# Patient Record
Sex: Female | Born: 1955
Health system: Southern US, Community
[De-identification: ages and names within clinical notes are randomized; demographics above are authoritative.]

## PROBLEM LIST (undated history)

## (undated) DIAGNOSIS — E785 Hyperlipidemia, unspecified: Secondary | ICD-10-CM

## (undated) DIAGNOSIS — E7800 Pure hypercholesterolemia, unspecified: Secondary | ICD-10-CM

## (undated) DIAGNOSIS — I099 Rheumatic heart disease, unspecified: Secondary | ICD-10-CM

## (undated) DIAGNOSIS — Z952 Presence of prosthetic heart valve: Secondary | ICD-10-CM

## (undated) DIAGNOSIS — I272 Pulmonary hypertension, unspecified: Secondary | ICD-10-CM

## (undated) DIAGNOSIS — M1990 Unspecified osteoarthritis, unspecified site: Secondary | ICD-10-CM

## (undated) DIAGNOSIS — Z7901 Long term (current) use of anticoagulants: Secondary | ICD-10-CM

## (undated) DIAGNOSIS — R011 Cardiac murmur, unspecified: Secondary | ICD-10-CM

## (undated) DIAGNOSIS — E0590 Thyrotoxicosis, unspecified without thyrotoxic crisis or storm: Secondary | ICD-10-CM

## (undated) DIAGNOSIS — Z9889 Other specified postprocedural states: Secondary | ICD-10-CM

## (undated) DIAGNOSIS — I482 Chronic atrial fibrillation, unspecified: Secondary | ICD-10-CM

## (undated) DIAGNOSIS — E049 Nontoxic goiter, unspecified: Secondary | ICD-10-CM

## (undated) DIAGNOSIS — Z8639 Personal history of other endocrine, nutritional and metabolic disease: Secondary | ICD-10-CM

## (undated) DIAGNOSIS — E042 Nontoxic multinodular goiter: Secondary | ICD-10-CM

## (undated) DIAGNOSIS — I499 Cardiac arrhythmia, unspecified: Secondary | ICD-10-CM

## (undated) DIAGNOSIS — I4821 Permanent atrial fibrillation: Secondary | ICD-10-CM

## (undated) HISTORY — PX: TRANSTHORACIC ECHOCARDIOGRAM: SHX275

## (undated) HISTORY — DX: Nontoxic goiter, unspecified: E04.9

## (undated) HISTORY — PX: ERCP: SHX60

## (undated) HISTORY — DX: Nontoxic multinodular goiter: E04.2

## (undated) HISTORY — DX: Permanent atrial fibrillation: I48.21

## (undated) HISTORY — DX: Other specified postprocedural states: Z98.890

## (undated) HISTORY — DX: Pure hypercholesterolemia, unspecified: E78.00

## (undated) HISTORY — PX: CARDIAC CATHETERIZATION: SHX172

## (undated) HISTORY — DX: Rheumatic heart disease, unspecified: I09.9

## (undated) SURGERY — OPEN REDUCTION INTERNAL FIXATION (ORIF) ANKLE FRACTURE
Anesthesia: General | Laterality: Right

---

## 1995-06-24 HISTORY — PX: MITRAL VALVE REPLACEMENT: SHX147

## 1998-09-13 ENCOUNTER — Ambulatory Visit (HOSPITAL_COMMUNITY): Admission: RE | Admit: 1998-09-13 | Discharge: 1998-09-13 | Payer: Self-pay | Admitting: Internal Medicine

## 1998-09-14 ENCOUNTER — Encounter: Payer: Self-pay | Admitting: Internal Medicine

## 2001-12-14 ENCOUNTER — Encounter: Payer: Self-pay | Admitting: Orthopaedic Surgery

## 2001-12-21 ENCOUNTER — Inpatient Hospital Stay (HOSPITAL_COMMUNITY): Admission: RE | Admit: 2001-12-21 | Discharge: 2001-12-22 | Payer: Self-pay | Admitting: Orthopaedic Surgery

## 2001-12-21 ENCOUNTER — Encounter: Payer: Self-pay | Admitting: Orthopaedic Surgery

## 2001-12-21 HISTORY — PX: ANTERIOR CERVICAL DECOMP/DISCECTOMY FUSION: SHX1161

## 2002-03-31 ENCOUNTER — Emergency Department (HOSPITAL_COMMUNITY): Admission: EM | Admit: 2002-03-31 | Discharge: 2002-04-01 | Payer: Self-pay | Admitting: Emergency Medicine

## 2002-04-01 ENCOUNTER — Encounter: Payer: Self-pay | Admitting: Emergency Medicine

## 2002-04-05 ENCOUNTER — Inpatient Hospital Stay (HOSPITAL_COMMUNITY): Admission: EM | Admit: 2002-04-05 | Discharge: 2002-04-25 | Payer: Self-pay | Admitting: Emergency Medicine

## 2002-04-07 ENCOUNTER — Encounter: Payer: Self-pay | Admitting: Surgery

## 2002-04-08 ENCOUNTER — Encounter (INDEPENDENT_AMBULATORY_CARE_PROVIDER_SITE_OTHER): Payer: Self-pay | Admitting: Specialist

## 2002-04-08 ENCOUNTER — Encounter: Payer: Self-pay | Admitting: *Deleted

## 2002-04-08 ENCOUNTER — Encounter: Payer: Self-pay | Admitting: Surgery

## 2002-04-08 HISTORY — PX: LAPAROSCOPIC CHOLECYSTECTOMY: SUR755

## 2002-04-13 ENCOUNTER — Encounter: Payer: Self-pay | Admitting: Cardiology

## 2002-04-18 ENCOUNTER — Encounter: Payer: Self-pay | Admitting: *Deleted

## 2002-05-20 ENCOUNTER — Emergency Department (HOSPITAL_COMMUNITY): Admission: EM | Admit: 2002-05-20 | Discharge: 2002-05-20 | Payer: Self-pay | Admitting: Emergency Medicine

## 2002-05-30 ENCOUNTER — Ambulatory Visit (HOSPITAL_COMMUNITY): Admission: RE | Admit: 2002-05-30 | Discharge: 2002-05-31 | Payer: Self-pay | Admitting: *Deleted

## 2002-05-30 ENCOUNTER — Encounter: Payer: Self-pay | Admitting: *Deleted

## 2003-03-03 ENCOUNTER — Emergency Department (HOSPITAL_COMMUNITY): Admission: EM | Admit: 2003-03-03 | Discharge: 2003-03-03 | Payer: Self-pay | Admitting: Emergency Medicine

## 2003-05-12 ENCOUNTER — Emergency Department (HOSPITAL_COMMUNITY): Admission: AD | Admit: 2003-05-12 | Discharge: 2003-05-12 | Payer: Self-pay | Admitting: Family Medicine

## 2003-05-15 ENCOUNTER — Emergency Department (HOSPITAL_COMMUNITY): Admission: AD | Admit: 2003-05-15 | Discharge: 2003-05-15 | Payer: Self-pay | Admitting: Family Medicine

## 2003-09-24 ENCOUNTER — Emergency Department (HOSPITAL_COMMUNITY): Admission: EM | Admit: 2003-09-24 | Discharge: 2003-09-24 | Payer: Self-pay | Admitting: Emergency Medicine

## 2004-02-16 ENCOUNTER — Emergency Department (HOSPITAL_COMMUNITY): Admission: EM | Admit: 2004-02-16 | Discharge: 2004-02-16 | Payer: Self-pay | Admitting: Emergency Medicine

## 2004-04-30 ENCOUNTER — Ambulatory Visit: Payer: Self-pay | Admitting: Cardiology

## 2004-05-03 ENCOUNTER — Ambulatory Visit: Payer: Self-pay | Admitting: Cardiology

## 2004-05-10 ENCOUNTER — Ambulatory Visit: Payer: Self-pay | Admitting: Cardiology

## 2004-05-24 ENCOUNTER — Ambulatory Visit: Payer: Self-pay | Admitting: Cardiology

## 2004-06-07 ENCOUNTER — Ambulatory Visit: Payer: Self-pay | Admitting: Cardiology

## 2004-06-14 ENCOUNTER — Ambulatory Visit: Payer: Self-pay | Admitting: Cardiology

## 2004-07-01 ENCOUNTER — Emergency Department (HOSPITAL_COMMUNITY): Admission: EM | Admit: 2004-07-01 | Discharge: 2004-07-01 | Payer: Self-pay | Admitting: Emergency Medicine

## 2004-07-02 ENCOUNTER — Ambulatory Visit: Payer: Self-pay

## 2004-07-15 ENCOUNTER — Ambulatory Visit: Payer: Self-pay | Admitting: Internal Medicine

## 2004-07-22 ENCOUNTER — Ambulatory Visit: Payer: Self-pay | Admitting: Internal Medicine

## 2004-07-31 ENCOUNTER — Ambulatory Visit: Payer: Self-pay | Admitting: Cardiology

## 2004-08-21 ENCOUNTER — Ambulatory Visit: Payer: Self-pay | Admitting: Cardiology

## 2004-08-30 ENCOUNTER — Ambulatory Visit: Payer: Self-pay | Admitting: Internal Medicine

## 2004-09-13 ENCOUNTER — Ambulatory Visit: Payer: Self-pay | Admitting: Cardiology

## 2004-09-23 ENCOUNTER — Ambulatory Visit: Payer: Self-pay | Admitting: Cardiology

## 2004-09-27 ENCOUNTER — Ambulatory Visit: Payer: Self-pay | Admitting: Cardiology

## 2004-10-03 ENCOUNTER — Ambulatory Visit: Payer: Self-pay | Admitting: Cardiology

## 2004-10-07 ENCOUNTER — Ambulatory Visit: Payer: Self-pay | Admitting: *Deleted

## 2004-10-17 ENCOUNTER — Ambulatory Visit: Payer: Self-pay | Admitting: Cardiology

## 2004-10-29 ENCOUNTER — Ambulatory Visit: Payer: Self-pay | Admitting: Cardiology

## 2004-11-06 ENCOUNTER — Ambulatory Visit: Payer: Self-pay | Admitting: Cardiology

## 2004-11-12 ENCOUNTER — Emergency Department (HOSPITAL_COMMUNITY): Admission: EM | Admit: 2004-11-12 | Discharge: 2004-11-12 | Payer: Self-pay | Admitting: Family Medicine

## 2004-11-15 ENCOUNTER — Ambulatory Visit: Payer: Self-pay | Admitting: Cardiology

## 2004-11-25 ENCOUNTER — Ambulatory Visit: Payer: Self-pay | Admitting: Cardiology

## 2004-12-09 ENCOUNTER — Ambulatory Visit: Payer: Self-pay | Admitting: Cardiology

## 2004-12-16 ENCOUNTER — Ambulatory Visit: Payer: Self-pay | Admitting: Internal Medicine

## 2004-12-30 ENCOUNTER — Ambulatory Visit: Payer: Self-pay | Admitting: Cardiology

## 2005-01-03 ENCOUNTER — Ambulatory Visit: Payer: Self-pay | Admitting: Cardiology

## 2005-01-09 ENCOUNTER — Ambulatory Visit: Payer: Self-pay | Admitting: Cardiology

## 2005-01-23 ENCOUNTER — Ambulatory Visit: Payer: Self-pay | Admitting: Internal Medicine

## 2005-01-29 ENCOUNTER — Ambulatory Visit: Payer: Self-pay | Admitting: Cardiology

## 2005-02-13 ENCOUNTER — Ambulatory Visit: Payer: Self-pay | Admitting: Cardiology

## 2005-02-20 ENCOUNTER — Ambulatory Visit: Payer: Self-pay | Admitting: Internal Medicine

## 2005-03-03 ENCOUNTER — Ambulatory Visit: Payer: Self-pay | Admitting: Cardiology

## 2005-03-12 ENCOUNTER — Inpatient Hospital Stay (HOSPITAL_COMMUNITY): Admission: EM | Admit: 2005-03-12 | Discharge: 2005-03-14 | Payer: Self-pay | Admitting: Family Medicine

## 2005-03-17 ENCOUNTER — Ambulatory Visit: Payer: Self-pay | Admitting: Internal Medicine

## 2005-03-17 ENCOUNTER — Ambulatory Visit: Payer: Self-pay | Admitting: Endocrinology

## 2005-03-24 ENCOUNTER — Ambulatory Visit: Payer: Self-pay | Admitting: Cardiology

## 2005-04-14 ENCOUNTER — Ambulatory Visit: Payer: Self-pay | Admitting: Cardiology

## 2005-04-24 ENCOUNTER — Ambulatory Visit: Payer: Self-pay | Admitting: Cardiology

## 2005-05-05 ENCOUNTER — Ambulatory Visit: Payer: Self-pay | Admitting: Cardiology

## 2005-05-28 ENCOUNTER — Ambulatory Visit: Payer: Self-pay | Admitting: *Deleted

## 2005-06-11 ENCOUNTER — Ambulatory Visit: Payer: Self-pay | Admitting: Cardiology

## 2005-06-18 ENCOUNTER — Ambulatory Visit: Payer: Self-pay | Admitting: Endocrinology

## 2005-07-03 ENCOUNTER — Ambulatory Visit: Payer: Self-pay | Admitting: Cardiology

## 2005-07-31 ENCOUNTER — Ambulatory Visit: Payer: Self-pay | Admitting: Internal Medicine

## 2005-08-28 ENCOUNTER — Ambulatory Visit: Payer: Self-pay | Admitting: Cardiology

## 2005-09-18 ENCOUNTER — Ambulatory Visit: Payer: Self-pay | Admitting: Cardiology

## 2005-10-09 ENCOUNTER — Ambulatory Visit: Payer: Self-pay | Admitting: Internal Medicine

## 2005-10-11 ENCOUNTER — Emergency Department (HOSPITAL_COMMUNITY): Admission: EM | Admit: 2005-10-11 | Discharge: 2005-10-11 | Payer: Self-pay | Admitting: Emergency Medicine

## 2005-10-17 ENCOUNTER — Ambulatory Visit: Payer: Self-pay | Admitting: Endocrinology

## 2005-10-27 ENCOUNTER — Ambulatory Visit: Payer: Self-pay | Admitting: Cardiology

## 2005-11-12 ENCOUNTER — Ambulatory Visit: Payer: Self-pay | Admitting: Cardiology

## 2005-12-01 ENCOUNTER — Ambulatory Visit: Payer: Self-pay

## 2005-12-01 ENCOUNTER — Ambulatory Visit: Payer: Self-pay | Admitting: Cardiology

## 2005-12-01 ENCOUNTER — Encounter: Payer: Self-pay | Admitting: Cardiology

## 2005-12-22 ENCOUNTER — Ambulatory Visit: Payer: Self-pay | Admitting: Cardiovascular Disease

## 2006-01-05 ENCOUNTER — Ambulatory Visit: Payer: Self-pay | Admitting: Cardiology

## 2006-01-19 ENCOUNTER — Ambulatory Visit: Payer: Self-pay

## 2006-01-19 ENCOUNTER — Ambulatory Visit: Payer: Self-pay | Admitting: Cardiovascular Disease

## 2006-02-05 ENCOUNTER — Ambulatory Visit: Payer: Self-pay | Admitting: Cardiology

## 2006-02-12 ENCOUNTER — Ambulatory Visit: Payer: Self-pay | Admitting: Cardiology

## 2006-02-26 ENCOUNTER — Ambulatory Visit: Payer: Self-pay | Admitting: Cardiology

## 2006-03-12 ENCOUNTER — Ambulatory Visit: Payer: Self-pay | Admitting: Cardiology

## 2006-03-23 ENCOUNTER — Ambulatory Visit: Payer: Self-pay | Admitting: Endocrinology

## 2006-03-26 ENCOUNTER — Ambulatory Visit: Payer: Self-pay | Admitting: Cardiology

## 2006-04-22 ENCOUNTER — Ambulatory Visit: Payer: Self-pay | Admitting: Cardiology

## 2006-05-20 ENCOUNTER — Ambulatory Visit: Payer: Self-pay | Admitting: Cardiovascular Disease

## 2006-06-04 ENCOUNTER — Ambulatory Visit: Payer: Self-pay | Admitting: Cardiology

## 2006-06-13 ENCOUNTER — Emergency Department (HOSPITAL_COMMUNITY): Admission: EM | Admit: 2006-06-13 | Discharge: 2006-06-13 | Payer: Self-pay | Admitting: Emergency Medicine

## 2006-07-02 ENCOUNTER — Ambulatory Visit: Payer: Self-pay | Admitting: Cardiology

## 2006-07-16 ENCOUNTER — Ambulatory Visit: Payer: Self-pay | Admitting: Internal Medicine

## 2006-08-03 ENCOUNTER — Ambulatory Visit: Payer: Self-pay | Admitting: Cardiology

## 2006-08-17 ENCOUNTER — Ambulatory Visit: Payer: Self-pay | Admitting: Cardiology

## 2006-09-07 ENCOUNTER — Ambulatory Visit: Payer: Self-pay | Admitting: Internal Medicine

## 2006-09-14 ENCOUNTER — Ambulatory Visit: Payer: Self-pay | Admitting: Endocrinology

## 2006-09-14 LAB — CONVERTED CEMR LAB: TSH: 0.63 microintl units/mL (ref 0.35–5.50)

## 2006-09-28 ENCOUNTER — Ambulatory Visit: Payer: Self-pay | Admitting: Cardiology

## 2006-10-26 ENCOUNTER — Ambulatory Visit: Payer: Self-pay | Admitting: Cardiovascular Disease

## 2006-11-11 ENCOUNTER — Ambulatory Visit: Payer: Self-pay | Admitting: Cardiology

## 2006-12-08 ENCOUNTER — Ambulatory Visit: Payer: Self-pay | Admitting: Cardiology

## 2006-12-08 ENCOUNTER — Ambulatory Visit: Payer: Self-pay | Admitting: Internal Medicine

## 2006-12-08 LAB — CONVERTED CEMR LAB
ALT: 29 units/L (ref 0–40)
AST: 36 units/L (ref 0–37)
Albumin: 3.6 g/dL (ref 3.5–5.2)
Calcium: 9.8 mg/dL (ref 8.4–10.5)
Chloride: 99 meq/L (ref 96–112)
Creatinine, Ser: 0.9 mg/dL (ref 0.4–1.2)
GFR calc non Af Amer: 70 mL/min
Glucose, Bld: 106 mg/dL — ABNORMAL HIGH (ref 70–99)
Total CHOL/HDL Ratio: 6.4
VLDL: 29 mg/dL (ref 0–40)

## 2006-12-24 ENCOUNTER — Encounter: Payer: Self-pay | Admitting: Emergency Medicine

## 2006-12-24 ENCOUNTER — Ambulatory Visit: Payer: Self-pay | Admitting: *Deleted

## 2006-12-25 ENCOUNTER — Ambulatory Visit: Payer: Self-pay | Admitting: Cardiology

## 2006-12-25 ENCOUNTER — Observation Stay (HOSPITAL_COMMUNITY): Admission: EM | Admit: 2006-12-25 | Discharge: 2006-12-29 | Payer: Self-pay | Admitting: *Deleted

## 2006-12-31 ENCOUNTER — Ambulatory Visit: Payer: Self-pay | Admitting: Cardiovascular Disease

## 2007-01-04 ENCOUNTER — Ambulatory Visit: Payer: Self-pay | Admitting: Cardiology

## 2007-01-04 LAB — CONVERTED CEMR LAB
INR: 1.4 (ref 0.9–2.0)
Prothrombin Time: 18.5 s — ABNORMAL HIGH (ref 10.0–14.0)
Prothrombin Time: 14.6 s — ABNORMAL HIGH (ref 10.0–14.0)

## 2007-01-06 ENCOUNTER — Ambulatory Visit: Payer: Self-pay | Admitting: Cardiology

## 2007-01-11 ENCOUNTER — Ambulatory Visit: Payer: Self-pay | Admitting: Cardiology

## 2007-01-19 ENCOUNTER — Ambulatory Visit: Payer: Self-pay | Admitting: Cardiology

## 2007-01-20 ENCOUNTER — Ambulatory Visit (HOSPITAL_BASED_OUTPATIENT_CLINIC_OR_DEPARTMENT_OTHER): Admission: RE | Admit: 2007-01-20 | Discharge: 2007-01-20 | Payer: Self-pay | Admitting: Orthopedic Surgery

## 2007-01-20 HISTORY — PX: CARPAL TUNNEL RELEASE: SHX101

## 2007-02-02 ENCOUNTER — Ambulatory Visit: Payer: Self-pay | Admitting: Cardiology

## 2007-02-23 ENCOUNTER — Ambulatory Visit: Payer: Self-pay | Admitting: Cardiology

## 2007-02-23 LAB — CONVERTED CEMR LAB: INR: 4.5 — ABNORMAL HIGH (ref 0.8–1.0)

## 2007-03-01 ENCOUNTER — Ambulatory Visit: Payer: Self-pay | Admitting: Cardiology

## 2007-03-01 LAB — CONVERTED CEMR LAB
ALT: 20 units/L (ref 0–35)
AST: 26 units/L (ref 0–37)
Albumin: 3.3 g/dL — ABNORMAL LOW (ref 3.5–5.2)
Alkaline Phosphatase: 70 units/L (ref 39–117)
VLDL: 32 mg/dL (ref 0–40)

## 2007-03-09 ENCOUNTER — Ambulatory Visit: Payer: Self-pay | Admitting: Cardiology

## 2007-03-25 ENCOUNTER — Ambulatory Visit: Payer: Self-pay | Admitting: Cardiology

## 2007-04-08 ENCOUNTER — Ambulatory Visit: Payer: Self-pay | Admitting: Cardiology

## 2007-04-22 ENCOUNTER — Ambulatory Visit: Payer: Self-pay | Admitting: Internal Medicine

## 2007-04-22 LAB — CONVERTED CEMR LAB: Prothrombin Time: 26.3 s — ABNORMAL HIGH (ref 10.9–13.3)

## 2007-05-06 ENCOUNTER — Ambulatory Visit: Payer: Self-pay | Admitting: Cardiology

## 2007-05-24 ENCOUNTER — Ambulatory Visit: Payer: Self-pay | Admitting: Internal Medicine

## 2007-06-07 ENCOUNTER — Ambulatory Visit: Payer: Self-pay | Admitting: Internal Medicine

## 2007-06-21 ENCOUNTER — Ambulatory Visit: Payer: Self-pay | Admitting: Cardiology

## 2007-06-28 ENCOUNTER — Ambulatory Visit: Payer: Self-pay | Admitting: Cardiology

## 2007-07-06 ENCOUNTER — Encounter: Payer: Self-pay | Admitting: Cardiology

## 2007-07-06 ENCOUNTER — Ambulatory Visit: Payer: Self-pay

## 2007-07-12 ENCOUNTER — Ambulatory Visit: Payer: Self-pay | Admitting: Cardiology

## 2007-07-23 ENCOUNTER — Encounter: Admission: RE | Admit: 2007-07-23 | Discharge: 2007-07-23 | Payer: Self-pay | Admitting: Orthopedic Surgery

## 2007-07-26 ENCOUNTER — Ambulatory Visit: Payer: Self-pay | Admitting: Internal Medicine

## 2007-08-16 ENCOUNTER — Ambulatory Visit: Payer: Self-pay | Admitting: Internal Medicine

## 2007-08-16 ENCOUNTER — Ambulatory Visit: Payer: Self-pay | Admitting: Cardiology

## 2007-08-16 ENCOUNTER — Inpatient Hospital Stay (HOSPITAL_COMMUNITY): Admission: EM | Admit: 2007-08-16 | Discharge: 2007-08-29 | Payer: Self-pay | Admitting: Emergency Medicine

## 2007-08-18 ENCOUNTER — Ambulatory Visit (HOSPITAL_BASED_OUTPATIENT_CLINIC_OR_DEPARTMENT_OTHER): Admission: RE | Admit: 2007-08-18 | Discharge: 2007-08-18 | Payer: Self-pay | Admitting: Orthopedic Surgery

## 2007-08-21 ENCOUNTER — Encounter: Payer: Self-pay | Admitting: Internal Medicine

## 2007-09-03 ENCOUNTER — Ambulatory Visit: Payer: Self-pay | Admitting: Internal Medicine

## 2007-09-03 ENCOUNTER — Ambulatory Visit: Payer: Self-pay | Admitting: Cardiology

## 2007-09-09 ENCOUNTER — Ambulatory Visit: Payer: Self-pay | Admitting: Cardiology

## 2007-09-09 ENCOUNTER — Inpatient Hospital Stay (HOSPITAL_COMMUNITY): Admission: EM | Admit: 2007-09-09 | Discharge: 2007-09-13 | Payer: Self-pay | Admitting: Emergency Medicine

## 2007-09-13 ENCOUNTER — Telehealth (INDEPENDENT_AMBULATORY_CARE_PROVIDER_SITE_OTHER): Payer: Self-pay | Admitting: *Deleted

## 2007-09-17 ENCOUNTER — Ambulatory Visit: Payer: Self-pay | Admitting: Cardiology

## 2007-09-24 ENCOUNTER — Ambulatory Visit: Payer: Self-pay | Admitting: Cardiology

## 2007-10-04 ENCOUNTER — Ambulatory Visit: Payer: Self-pay | Admitting: Cardiology

## 2007-10-12 ENCOUNTER — Ambulatory Visit: Payer: Self-pay | Admitting: Endocrinology

## 2007-10-12 DIAGNOSIS — I482 Chronic atrial fibrillation, unspecified: Secondary | ICD-10-CM

## 2007-10-12 DIAGNOSIS — E042 Nontoxic multinodular goiter: Secondary | ICD-10-CM | POA: Insufficient documentation

## 2007-10-12 DIAGNOSIS — K219 Gastro-esophageal reflux disease without esophagitis: Secondary | ICD-10-CM

## 2007-10-12 DIAGNOSIS — E7800 Pure hypercholesterolemia, unspecified: Secondary | ICD-10-CM

## 2007-10-12 LAB — CONVERTED CEMR LAB: Free T4: 0.9 ng/dL (ref 0.6–1.6)

## 2007-10-18 ENCOUNTER — Ambulatory Visit: Payer: Self-pay | Admitting: Cardiovascular Disease

## 2007-10-18 ENCOUNTER — Ambulatory Visit: Payer: Self-pay | Admitting: Cardiology

## 2007-11-01 ENCOUNTER — Ambulatory Visit: Payer: Self-pay | Admitting: Cardiology

## 2007-11-12 ENCOUNTER — Ambulatory Visit: Payer: Self-pay | Admitting: Cardiovascular Disease

## 2007-11-30 ENCOUNTER — Ambulatory Visit: Payer: Self-pay | Admitting: Internal Medicine

## 2007-12-06 ENCOUNTER — Ambulatory Visit: Payer: Self-pay | Admitting: Internal Medicine

## 2007-12-10 ENCOUNTER — Ambulatory Visit (HOSPITAL_BASED_OUTPATIENT_CLINIC_OR_DEPARTMENT_OTHER): Admission: RE | Admit: 2007-12-10 | Discharge: 2007-12-10 | Payer: Self-pay | Admitting: Orthopedic Surgery

## 2007-12-10 HISTORY — PX: WRIST ARTHROSCOPY: SUR100

## 2007-12-13 ENCOUNTER — Ambulatory Visit: Payer: Self-pay | Admitting: Internal Medicine

## 2007-12-16 ENCOUNTER — Ambulatory Visit: Payer: Self-pay | Admitting: Cardiology

## 2008-01-03 ENCOUNTER — Ambulatory Visit: Payer: Self-pay | Admitting: Cardiology

## 2008-01-31 ENCOUNTER — Ambulatory Visit: Payer: Self-pay | Admitting: Cardiology

## 2008-02-21 ENCOUNTER — Ambulatory Visit: Payer: Self-pay | Admitting: Cardiology

## 2008-03-09 ENCOUNTER — Encounter: Admission: RE | Admit: 2008-03-09 | Discharge: 2008-03-09 | Payer: Self-pay | Admitting: Gastroenterology

## 2008-03-10 ENCOUNTER — Encounter: Admission: RE | Admit: 2008-03-10 | Discharge: 2008-03-10 | Payer: Self-pay | Admitting: Gastroenterology

## 2008-03-14 ENCOUNTER — Ambulatory Visit: Payer: Self-pay | Admitting: Cardiology

## 2008-03-22 ENCOUNTER — Encounter: Payer: Self-pay | Admitting: Cardiology

## 2008-03-23 ENCOUNTER — Ambulatory Visit: Payer: Self-pay | Admitting: Cardiology

## 2008-03-23 LAB — CONVERTED CEMR LAB
Cholesterol: 192 mg/dL (ref 0–200)
Total CHOL/HDL Ratio: 3.3
Triglycerides: 73 mg/dL (ref 0–149)

## 2008-04-04 ENCOUNTER — Ambulatory Visit: Payer: Self-pay | Admitting: Internal Medicine

## 2008-04-17 ENCOUNTER — Ambulatory Visit: Payer: Self-pay | Admitting: Endocrinology

## 2008-04-25 ENCOUNTER — Ambulatory Visit: Payer: Self-pay | Admitting: Internal Medicine

## 2008-05-08 ENCOUNTER — Ambulatory Visit: Payer: Self-pay | Admitting: Cardiology

## 2008-05-08 LAB — CONVERTED CEMR LAB
Cholesterol: 175 mg/dL (ref 0–200)
LDL Cholesterol: 116 mg/dL — ABNORMAL HIGH (ref 0–99)
Triglycerides: 97 mg/dL (ref 0–149)

## 2008-05-17 ENCOUNTER — Ambulatory Visit: Payer: Self-pay | Admitting: Cardiovascular Disease

## 2008-05-26 ENCOUNTER — Ambulatory Visit: Payer: Self-pay | Admitting: Internal Medicine

## 2008-06-05 ENCOUNTER — Ambulatory Visit: Payer: Self-pay | Admitting: Cardiology

## 2008-06-05 LAB — CONVERTED CEMR LAB
Albumin: 3.5 g/dL (ref 3.5–5.2)
Alkaline Phosphatase: 66 units/L (ref 39–117)
Direct LDL: 148.3 mg/dL
HDL: 45.8 mg/dL (ref >39.0)
INR: 5.1 — ABNORMAL HIGH (ref 0.8–1.0)
Prothrombin Time: 50.1 s — ABNORMAL HIGH (ref 10.9–13.3)

## 2008-06-19 ENCOUNTER — Ambulatory Visit: Payer: Self-pay | Admitting: Cardiology

## 2008-06-26 ENCOUNTER — Ambulatory Visit: Payer: Self-pay | Admitting: Internal Medicine

## 2008-07-11 ENCOUNTER — Ambulatory Visit: Payer: Self-pay | Admitting: Cardiology

## 2008-08-03 ENCOUNTER — Ambulatory Visit: Payer: Self-pay | Admitting: Cardiovascular Disease

## 2008-08-17 ENCOUNTER — Ambulatory Visit: Payer: Self-pay | Admitting: Internal Medicine

## 2008-08-31 ENCOUNTER — Ambulatory Visit: Payer: Self-pay | Admitting: Cardiovascular Disease

## 2008-09-21 ENCOUNTER — Ambulatory Visit: Payer: Self-pay | Admitting: Cardiovascular Disease

## 2008-10-02 ENCOUNTER — Telehealth (INDEPENDENT_AMBULATORY_CARE_PROVIDER_SITE_OTHER): Payer: Self-pay | Admitting: *Deleted

## 2008-10-03 ENCOUNTER — Ambulatory Visit: Payer: Self-pay | Admitting: Cardiology

## 2008-10-03 ENCOUNTER — Encounter (INDEPENDENT_AMBULATORY_CARE_PROVIDER_SITE_OTHER): Payer: Self-pay | Admitting: Cardiology

## 2008-10-04 ENCOUNTER — Telehealth (INDEPENDENT_AMBULATORY_CARE_PROVIDER_SITE_OTHER): Payer: Self-pay | Admitting: Cardiology

## 2008-10-06 ENCOUNTER — Ambulatory Visit (HOSPITAL_BASED_OUTPATIENT_CLINIC_OR_DEPARTMENT_OTHER): Admission: RE | Admit: 2008-10-06 | Discharge: 2008-10-07 | Payer: Self-pay | Admitting: Orthopedic Surgery

## 2008-10-06 HISTORY — PX: OTHER SURGICAL HISTORY: SHX169

## 2008-10-10 ENCOUNTER — Telehealth: Payer: Self-pay | Admitting: Cardiology

## 2008-10-13 ENCOUNTER — Ambulatory Visit: Payer: Self-pay | Admitting: Cardiology

## 2008-10-13 LAB — CONVERTED CEMR LAB
INR: 3.3 — ABNORMAL HIGH (ref 0.8–1.0)
Prothrombin Time: 33 s — ABNORMAL HIGH (ref 10.9–13.3)

## 2008-10-17 DIAGNOSIS — I10 Essential (primary) hypertension: Secondary | ICD-10-CM

## 2008-10-17 DIAGNOSIS — Z8679 Personal history of other diseases of the circulatory system: Secondary | ICD-10-CM

## 2008-10-17 DIAGNOSIS — I272 Pulmonary hypertension, unspecified: Secondary | ICD-10-CM

## 2008-10-19 ENCOUNTER — Ambulatory Visit: Payer: Self-pay | Admitting: Cardiology

## 2008-11-02 ENCOUNTER — Ambulatory Visit: Payer: Self-pay | Admitting: Cardiovascular Disease

## 2008-11-13 ENCOUNTER — Ambulatory Visit: Payer: Self-pay | Admitting: Cardiovascular Disease

## 2008-11-21 ENCOUNTER — Encounter: Payer: Self-pay | Admitting: *Deleted

## 2008-11-23 ENCOUNTER — Ambulatory Visit: Payer: Self-pay | Admitting: Internal Medicine

## 2008-11-23 ENCOUNTER — Encounter (INDEPENDENT_AMBULATORY_CARE_PROVIDER_SITE_OTHER): Payer: Self-pay | Admitting: Cardiology

## 2008-12-07 ENCOUNTER — Ambulatory Visit: Payer: Self-pay | Admitting: Internal Medicine

## 2008-12-18 ENCOUNTER — Ambulatory Visit: Payer: Self-pay | Admitting: Cardiology

## 2008-12-18 LAB — CONVERTED CEMR LAB: Prothrombin Time: 27.3 s

## 2008-12-26 LAB — CONVERTED CEMR LAB
ALT: 28 units/L (ref 0–35)
Albumin: 3.7 g/dL (ref 3.5–5.2)
Cholesterol: 159 mg/dL (ref 0–200)
LDL Cholesterol: 95 mg/dL (ref 0–99)
Total Protein: 7.6 g/dL (ref 6.0–8.3)
Triglycerides: 140 mg/dL (ref 0.0–149.0)
VLDL: 28 mg/dL (ref 0.0–40.0)

## 2008-12-27 ENCOUNTER — Encounter: Payer: Self-pay | Admitting: Cardiology

## 2008-12-27 ENCOUNTER — Encounter: Payer: Self-pay | Admitting: *Deleted

## 2009-01-01 ENCOUNTER — Ambulatory Visit: Payer: Self-pay | Admitting: Cardiovascular Disease

## 2009-01-01 ENCOUNTER — Encounter (INDEPENDENT_AMBULATORY_CARE_PROVIDER_SITE_OTHER): Payer: Self-pay | Admitting: Cardiology

## 2009-01-01 LAB — CONVERTED CEMR LAB: POC INR: 4

## 2009-01-22 ENCOUNTER — Ambulatory Visit: Payer: Self-pay | Admitting: Cardiovascular Disease

## 2009-01-22 LAB — CONVERTED CEMR LAB: Prothrombin Time: 22.3 s

## 2009-02-19 ENCOUNTER — Ambulatory Visit: Payer: Self-pay | Admitting: Cardiovascular Disease

## 2009-03-03 IMAGING — CT CT ANGIO CHEST
2 of 7 series · 17 of 36 positions shown · IV contrast (APPLIED)
Comparison: none

CLINICAL DATA: Chest pain.   Evaluate for dissection. 
CT ANGIOGRAPHY OF CHEST:
TECHNIQUE: Multidetector CT imaging of the chest was performed during bolus injection of intravenous contrast.  Multiplanar CT angiographic image reconstructions were generated to evaluate the vascular anatomy.
Contrast:  100 mL Omnipaque 300.
TECHNIQUE: Multidetector CT imaging of the abdomen was performed during bolus injection of intravenous contrast.  Multiplanar CT angiographic image reconstructions were generated to evaluate the vascular anatomy.
TECHNIQUE: Multidetector CT imaging of the pelvis was performed during bolus injection of intravenous contrast.  Multiplanar CT angiographic image reconstructions were generated to evaluate the vascular anatomy.

[Series 5: dissection 2.0 st · axial · 0.65mm/px · z∈[+916,+1494]mm · 14 of 335 slices shown]
[im 23/335  lung]
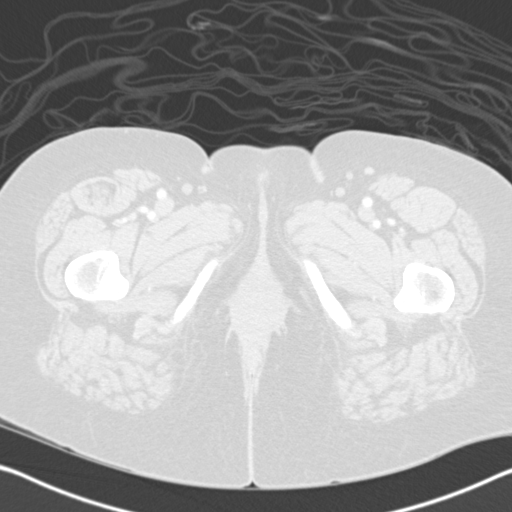
[im 45/335  mediastinal]
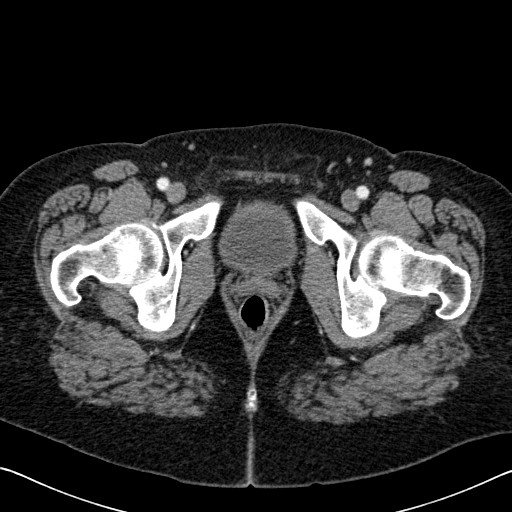
[im 67/335  lung]
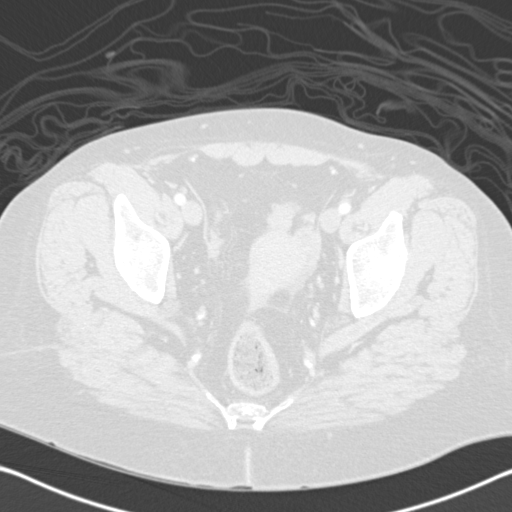
[im 90/335  mediastinal]
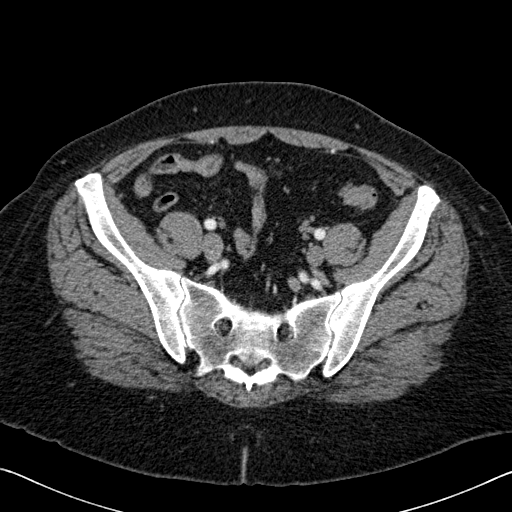
[im 112/335  lung]
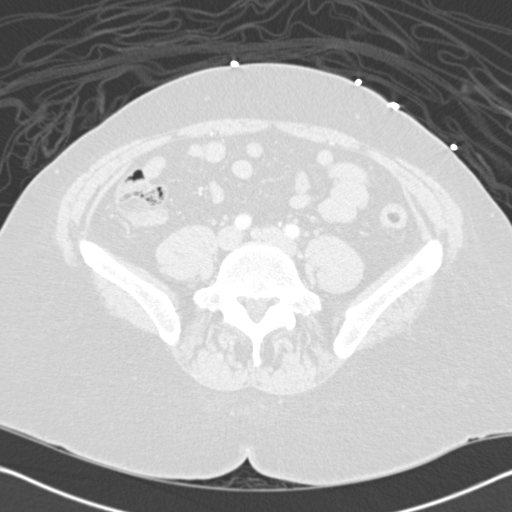
[im 134/335  mediastinal]
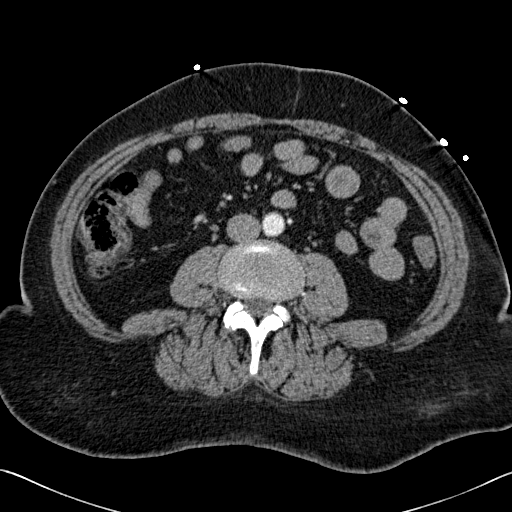
[im 156/335  lung]
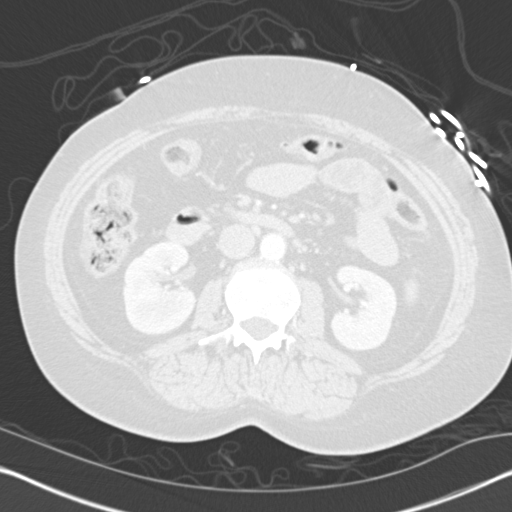
[im 179/335  mediastinal]
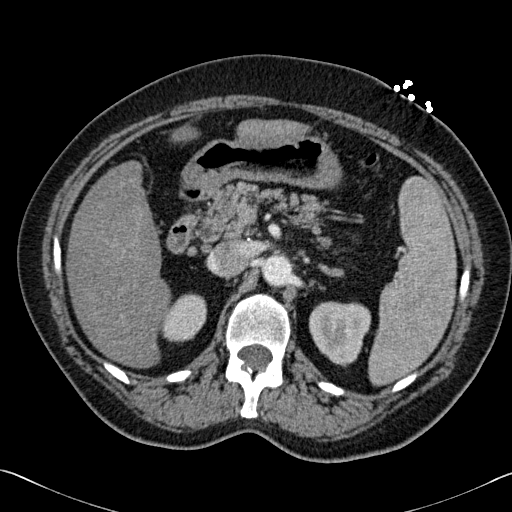
[im 201/335  lung]
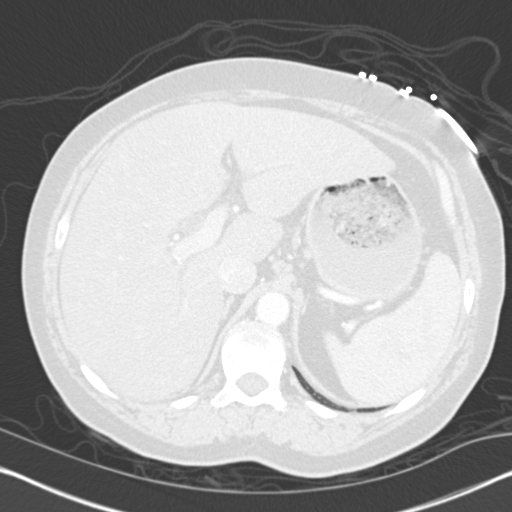
[im 223/335  mediastinal]
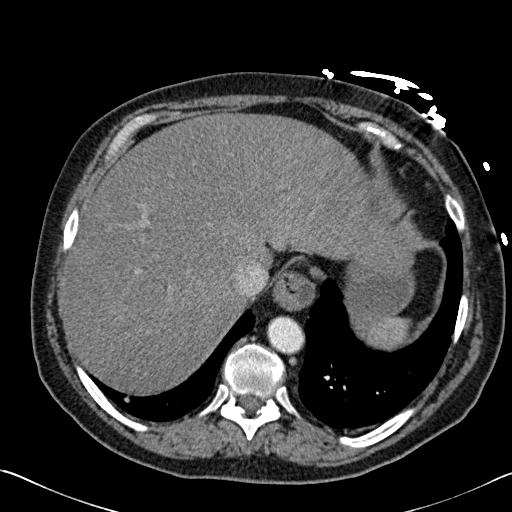
[im 245/335  lung]
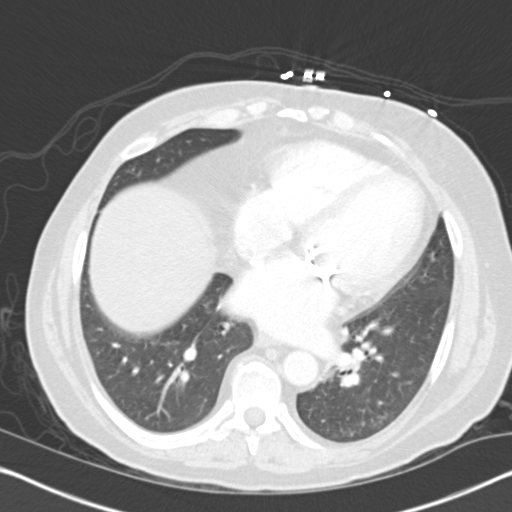
[im 268/335  mediastinal]
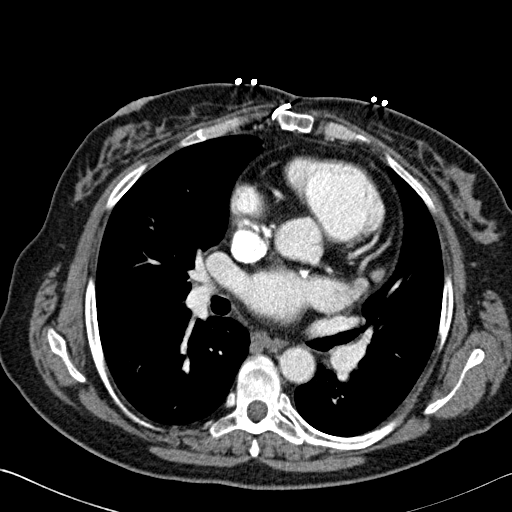
[im 290/335  lung]
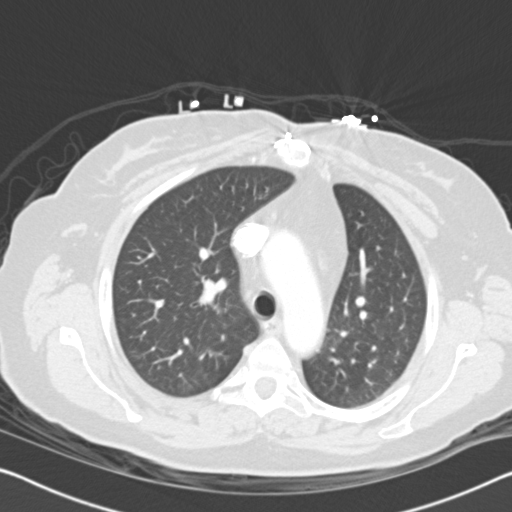
[im 312/335  mediastinal]
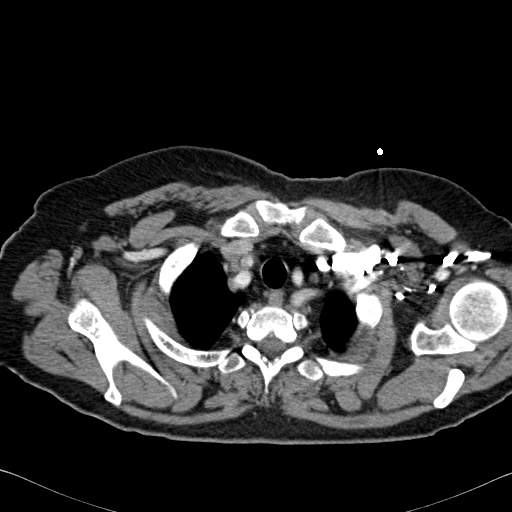

[Series 12: dissection 1.0 st sag · coronal · 1.31mm/px · 3 of 256 slices shown]
[im 52/256  mediastinal]
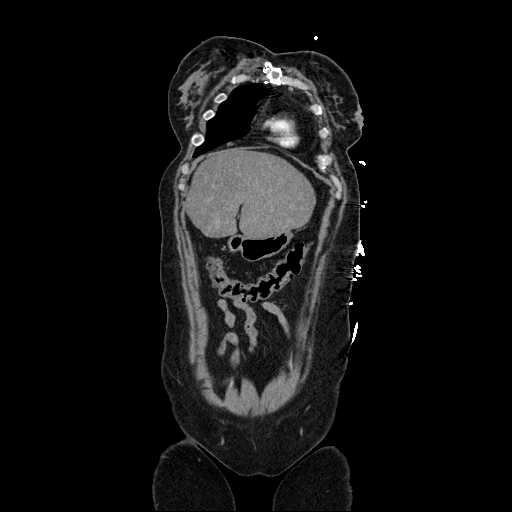
[im 103/256  mediastinal]
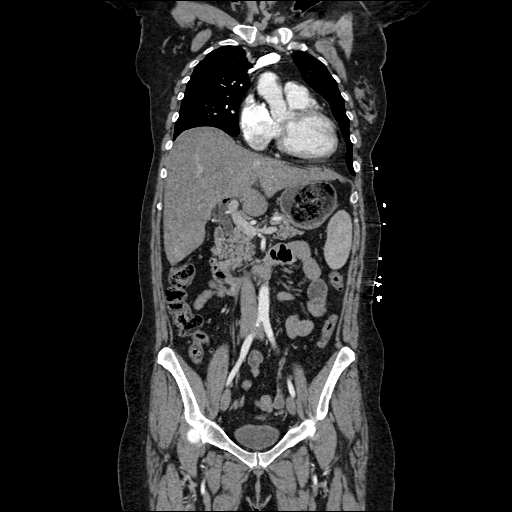
[im 154/256  mediastinal]
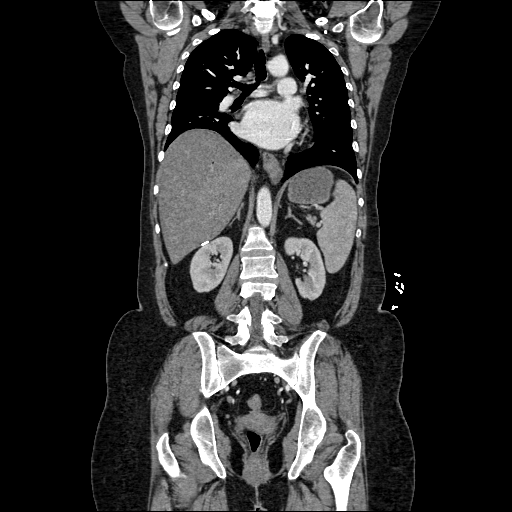

[17 of 36 positions shown; findings below may reference images not displayed]

FINDINGS: The heart is markedly enlarged.  There has been a previous mitral valve replacement.  There are no failure or infiltrates.  A few, small calcified granulomatous nodes are seen in the mediastinum.  There is no evidence for pulmonary embolic disease.  There is no aortic dissection or pleural effusion.  No pericardial effusion is present.
IMPRESSION: 1.  No evidence for aortic dissection or acute chest pathology. 
2.  Cardiomegaly with previous mitral valve replacement.  
CT ANGIOGRAPHY OF ABDOMEN:
FINDINGS: The liver is unremarkable.  The gallbladder has been removed.  There is no evidence for aortic dissection or bleed.  There are a few small hypoattenuating foci within the spleen, not felt to be significant.  The adrenal glands are unremarkable.  The pancreas is normal.  There is post cholecystectomy dilatation of the common bile duct, a normal variant.   No small or large bowel abnormality is detected. There is no free fluid or free air.  Degenerative changes are present in the spine.
IMPRESSION: No acute intraabdominal findings; specifically no aortic pathology.
CT ANGIOGRAPHY OF PELVIS:
FINDINGS: There is no evidence of pelvic mass or adenopathy.  No inflammatory process or abnormal fluid collections are identified.  No other significant abnormality noted. 
No evidence for aortoiliac or ileofemoral atherosclerotic change, bleed, dissection, or aneurysmal dilatation.
IMPRESSION: Negative pelvis.

## 2009-03-05 ENCOUNTER — Ambulatory Visit: Payer: Self-pay | Admitting: Cardiovascular Disease

## 2009-03-05 IMAGING — CR DG CHEST 1V PORT
1 series · 1 of 1 positions shown · non-contrast
Comparison: CT chest and chest x-ray, 08/16/07.

CLINICAL DATA: Endotracheal tube and central line placement.
 PORTABLE CHEST - 1 VIEW - 08/18/07:

[view not recorded]
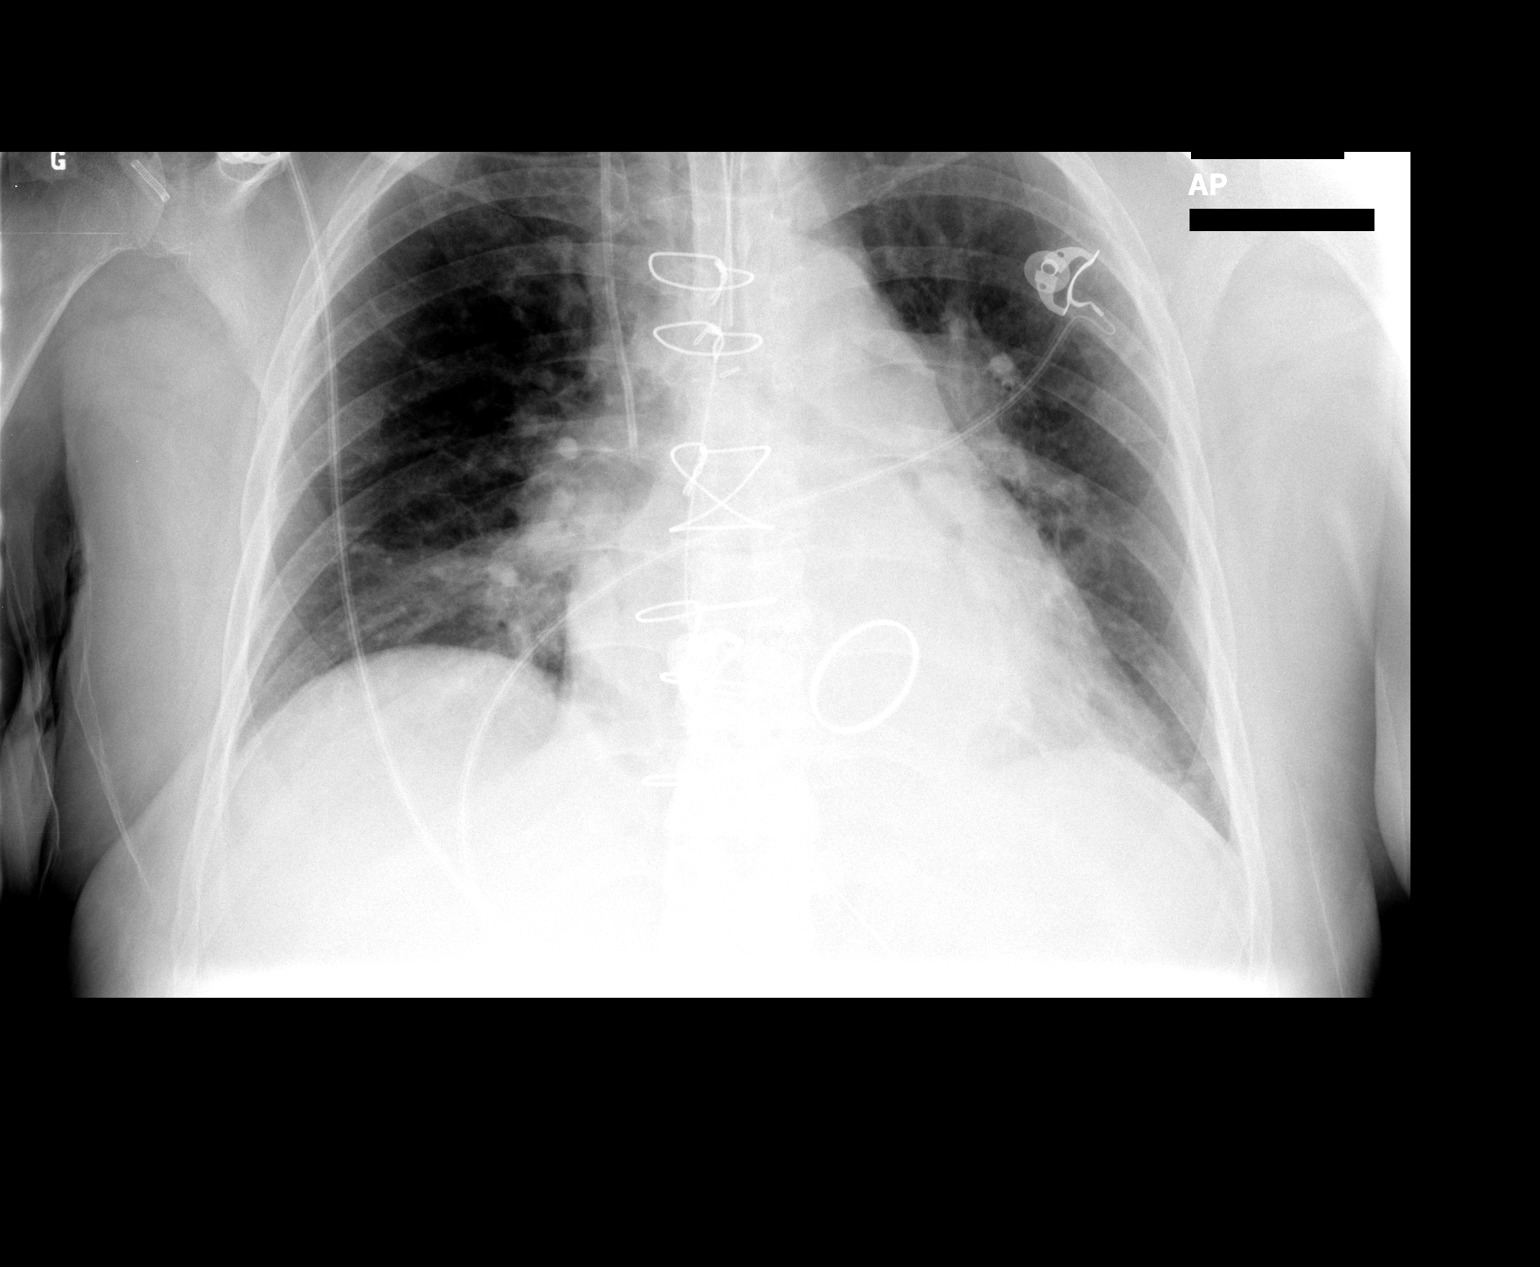

[1 of 1 positions shown; findings below may reference images not displayed]

FINDINGS: The endotracheal tube terminates approximately 1.5 cm above the carina.  A nasogastric tube is followed into the stomach with the tip projecting beyond the inferior boundary of the film.  A right IJ central line is in the SVC.  No pneumothorax.
 The heart size is stable.  Bibasilar atelectasis.
IMPRESSION: 1.  Endotracheal tube is somewhat low-lying.  Pulling back approximately 2-3 cm would better position the tip above the carina.
 2.  No pneumothorax after right IJ central line placement.
 3.  Bibasilar atelectasis.

## 2009-03-06 IMAGING — CR DG CHEST 1V PORT
1 series · 1 of 1 positions shown · non-contrast
Comparison: none

CLINICAL DATA: Respiratory distress/on ventilator.
 PORTABLE CHEST - 1 VIEW:

[view not recorded]
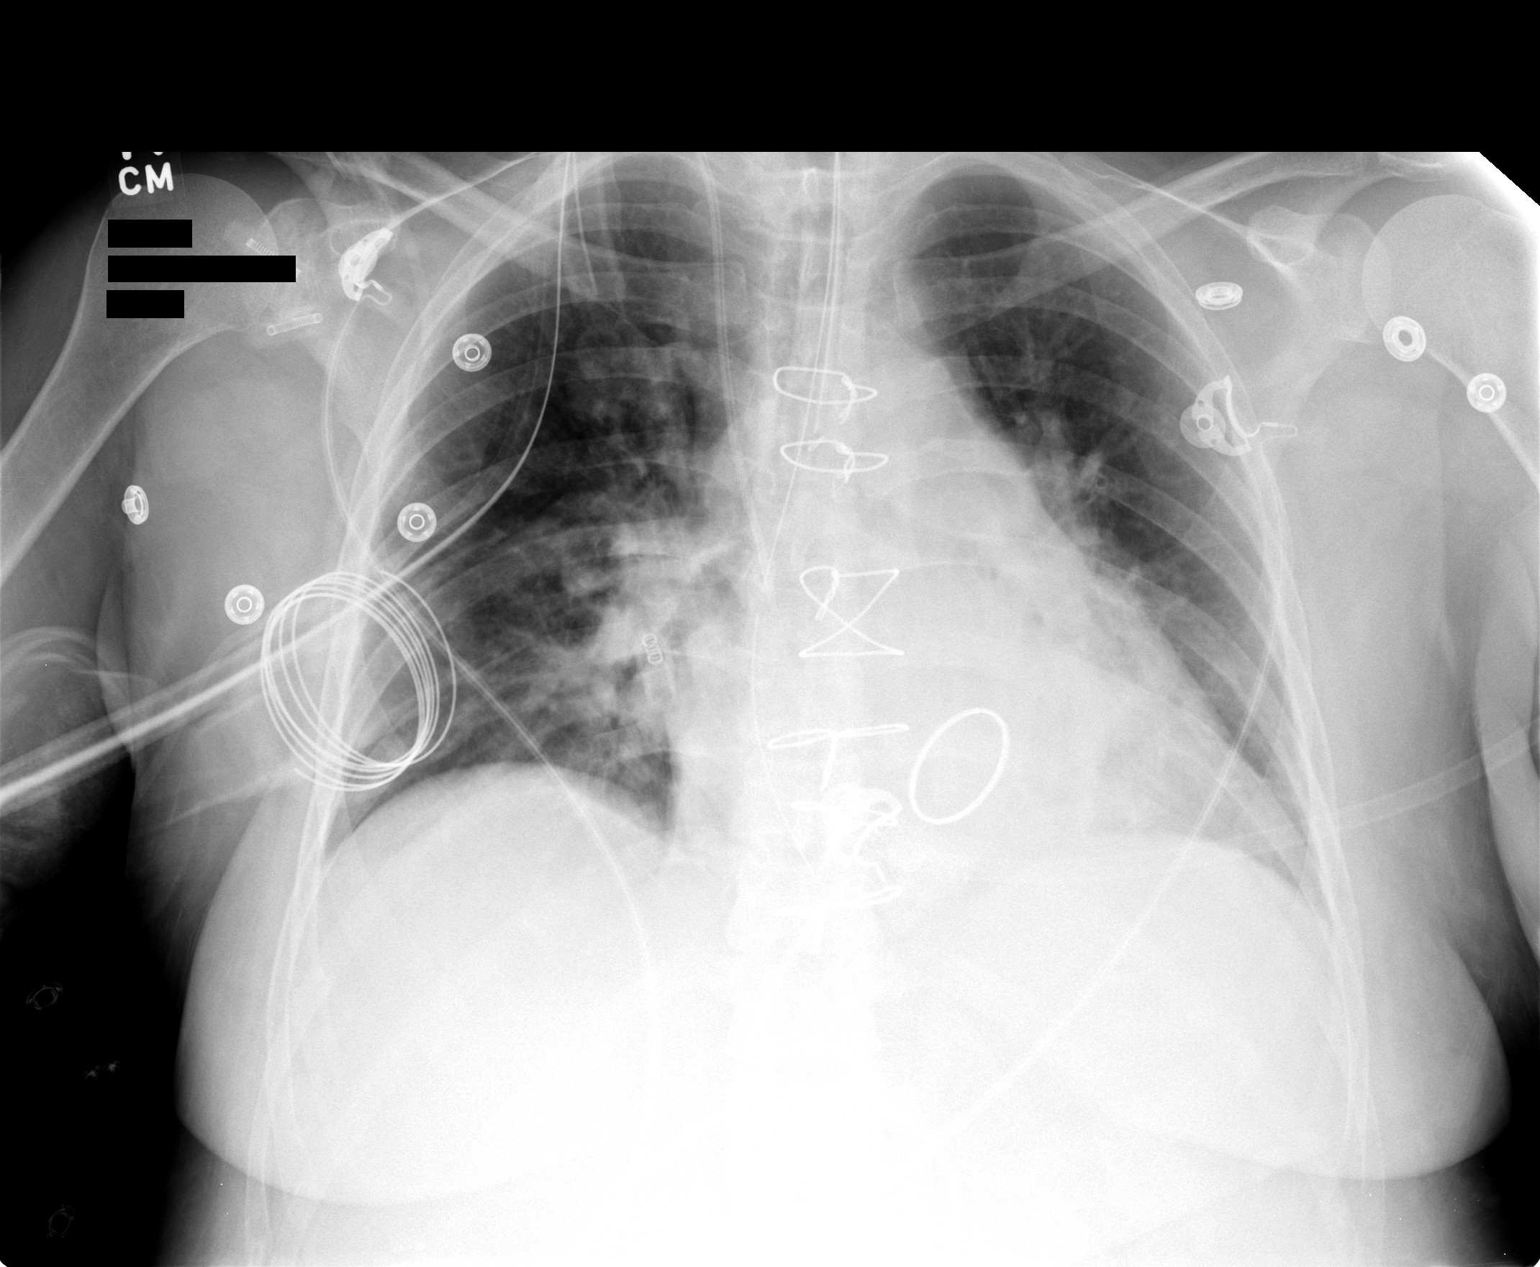

[1 of 1 positions shown; findings below may reference images not displayed]

FINDINGS: Bilateral basilar atelectasis/pneumonia about the same. No heart failure. Support apparatus unchanged with the ET tube about 11 mm above the carina.
IMPRESSION: No significant change ? the tip of the endotracheal tube is only about 11 mm above the carina.

## 2009-03-07 IMAGING — CR DG CHEST 1V PORT
1 series · 1 of 1 positions shown · non-contrast
Comparison: 08/19/07.

CLINICAL DATA: Abdominal pain.
 PORTABLE CHEST - 1 VIEW:

[view not recorded]
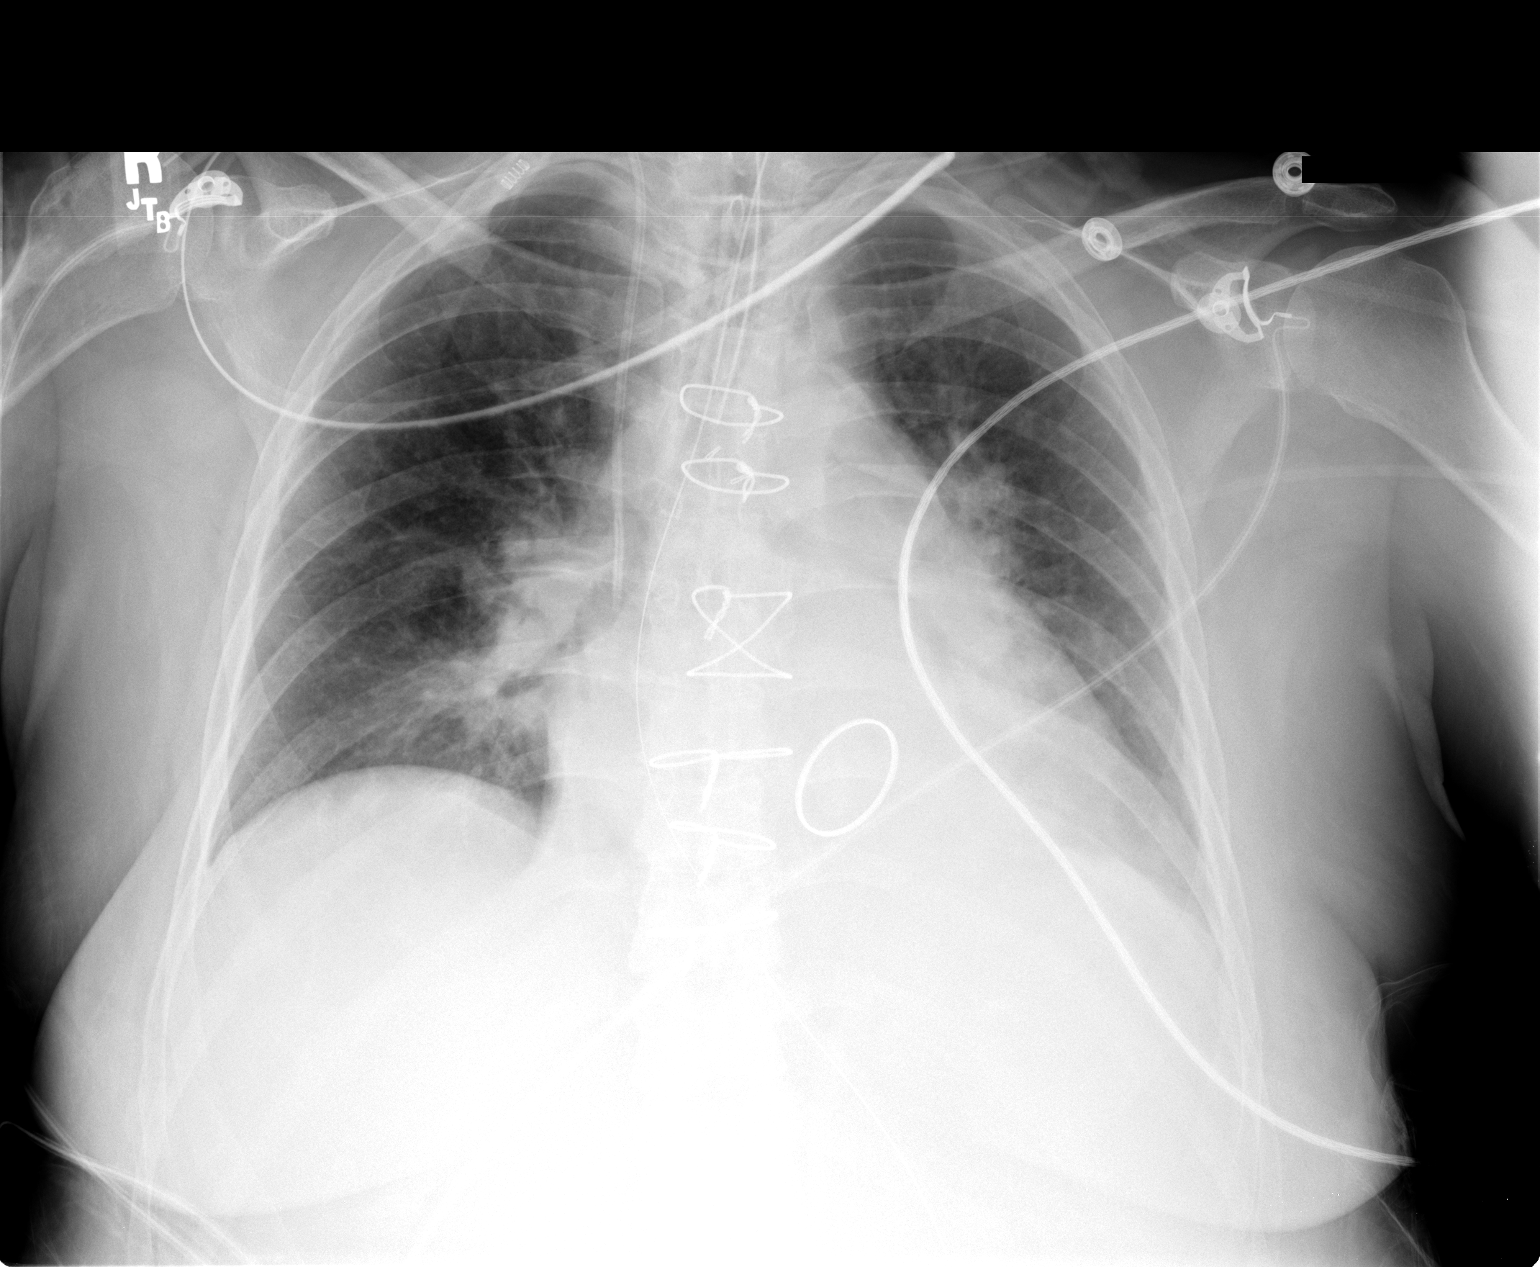

[1 of 1 positions shown; findings below may reference images not displayed]

FINDINGS: Endotracheal tube is 22 mm above the carina. Central line is in the SVC. Bibasilar atelectasis is unchanged from yesterday.  There is no edema or effusion.
IMPRESSION: 1.  Endotracheal tube is 22 mm above the carina.
 2.  Bibasilar atelectasis is unchanged.

## 2009-03-16 ENCOUNTER — Ambulatory Visit: Payer: Self-pay | Admitting: Internal Medicine

## 2009-03-16 LAB — CONVERTED CEMR LAB: POC INR: 4

## 2009-03-19 ENCOUNTER — Encounter: Admission: RE | Admit: 2009-03-19 | Discharge: 2009-03-19 | Payer: Self-pay | Admitting: Gastroenterology

## 2009-03-30 ENCOUNTER — Ambulatory Visit: Payer: Self-pay | Admitting: Internal Medicine

## 2009-04-18 ENCOUNTER — Telehealth (INDEPENDENT_AMBULATORY_CARE_PROVIDER_SITE_OTHER): Payer: Self-pay | Admitting: *Deleted

## 2009-04-19 ENCOUNTER — Ambulatory Visit: Payer: Self-pay | Admitting: Cardiovascular Disease

## 2009-04-19 ENCOUNTER — Ambulatory Visit: Payer: Self-pay | Admitting: Endocrinology

## 2009-04-19 ENCOUNTER — Encounter (INDEPENDENT_AMBULATORY_CARE_PROVIDER_SITE_OTHER): Payer: Self-pay | Admitting: *Deleted

## 2009-04-19 DIAGNOSIS — Z8639 Personal history of other endocrine, nutritional and metabolic disease: Secondary | ICD-10-CM

## 2009-04-19 LAB — CONVERTED CEMR LAB
POC INR: 3.9
TSH: 0.8 microintl units/mL (ref 0.35–5.50)

## 2009-04-24 ENCOUNTER — Ambulatory Visit: Payer: Self-pay | Admitting: Cardiology

## 2009-04-24 ENCOUNTER — Encounter (INDEPENDENT_AMBULATORY_CARE_PROVIDER_SITE_OTHER): Payer: Self-pay | Admitting: *Deleted

## 2009-04-24 DIAGNOSIS — Z9889 Other specified postprocedural states: Secondary | ICD-10-CM

## 2009-05-10 ENCOUNTER — Ambulatory Visit: Payer: Self-pay | Admitting: Cardiology

## 2009-05-10 LAB — CONVERTED CEMR LAB: POC INR: 2.4

## 2009-05-28 ENCOUNTER — Encounter (INDEPENDENT_AMBULATORY_CARE_PROVIDER_SITE_OTHER): Payer: Self-pay | Admitting: Cardiology

## 2009-05-28 ENCOUNTER — Ambulatory Visit: Payer: Self-pay | Admitting: Cardiology

## 2009-06-18 ENCOUNTER — Encounter (INDEPENDENT_AMBULATORY_CARE_PROVIDER_SITE_OTHER): Payer: Self-pay | Admitting: *Deleted

## 2009-06-18 ENCOUNTER — Telehealth: Payer: Self-pay | Admitting: Cardiology

## 2009-06-18 ENCOUNTER — Ambulatory Visit: Payer: Self-pay | Admitting: Internal Medicine

## 2009-06-18 LAB — CONVERTED CEMR LAB
INR: 3.8
POC INR: 3.8

## 2009-06-29 ENCOUNTER — Encounter: Payer: Self-pay | Admitting: Cardiology

## 2009-06-29 ENCOUNTER — Ambulatory Visit (HOSPITAL_COMMUNITY): Admission: RE | Admit: 2009-06-29 | Discharge: 2009-06-29 | Payer: Self-pay | Admitting: Cardiology

## 2009-06-29 ENCOUNTER — Ambulatory Visit: Payer: Self-pay

## 2009-06-29 ENCOUNTER — Ambulatory Visit: Payer: Self-pay | Admitting: Cardiovascular Disease

## 2009-07-13 ENCOUNTER — Ambulatory Visit: Payer: Self-pay | Admitting: Cardiology

## 2009-07-17 ENCOUNTER — Encounter (INDEPENDENT_AMBULATORY_CARE_PROVIDER_SITE_OTHER): Payer: Self-pay | Admitting: *Deleted

## 2009-08-09 ENCOUNTER — Ambulatory Visit: Payer: Self-pay | Admitting: Cardiology

## 2009-08-30 ENCOUNTER — Emergency Department (HOSPITAL_COMMUNITY): Admission: EM | Admit: 2009-08-30 | Discharge: 2009-08-30 | Payer: Self-pay | Admitting: Emergency Medicine

## 2009-08-30 ENCOUNTER — Ambulatory Visit: Payer: Self-pay | Admitting: Internal Medicine

## 2009-09-03 ENCOUNTER — Ambulatory Visit: Payer: Self-pay | Admitting: Internal Medicine

## 2009-10-01 ENCOUNTER — Encounter (INDEPENDENT_AMBULATORY_CARE_PROVIDER_SITE_OTHER): Payer: Self-pay | Admitting: Pharmacist

## 2009-10-01 ENCOUNTER — Ambulatory Visit: Payer: Self-pay | Admitting: Cardiovascular Disease

## 2009-10-01 LAB — CONVERTED CEMR LAB: POC INR: 2

## 2009-10-11 ENCOUNTER — Ambulatory Visit: Payer: Self-pay | Admitting: Cardiology

## 2009-10-11 LAB — CONVERTED CEMR LAB: POC INR: 2.7

## 2009-10-22 ENCOUNTER — Ambulatory Visit: Payer: Self-pay | Admitting: Cardiology

## 2009-10-22 LAB — CONVERTED CEMR LAB: POC INR: 3.4

## 2009-11-05 ENCOUNTER — Ambulatory Visit: Payer: Self-pay | Admitting: Cardiovascular Disease

## 2009-11-05 LAB — CONVERTED CEMR LAB: INR: 3.5

## 2009-12-03 ENCOUNTER — Ambulatory Visit: Payer: Self-pay | Admitting: Cardiovascular Disease

## 2009-12-26 ENCOUNTER — Ambulatory Visit: Payer: Self-pay | Admitting: Cardiology

## 2009-12-26 LAB — CONVERTED CEMR LAB: POC INR: 3.3

## 2010-01-23 ENCOUNTER — Ambulatory Visit: Payer: Self-pay | Admitting: Cardiovascular Disease

## 2010-02-21 ENCOUNTER — Ambulatory Visit: Payer: Self-pay | Admitting: Internal Medicine

## 2010-02-21 ENCOUNTER — Encounter (INDEPENDENT_AMBULATORY_CARE_PROVIDER_SITE_OTHER): Payer: Self-pay | Admitting: Pharmacist

## 2010-03-21 ENCOUNTER — Ambulatory Visit: Payer: Self-pay | Admitting: Cardiology

## 2010-03-21 ENCOUNTER — Encounter (INDEPENDENT_AMBULATORY_CARE_PROVIDER_SITE_OTHER): Payer: Self-pay | Admitting: Pharmacist

## 2010-04-04 ENCOUNTER — Encounter (INDEPENDENT_AMBULATORY_CARE_PROVIDER_SITE_OTHER): Payer: Self-pay | Admitting: Pharmacist

## 2010-04-04 ENCOUNTER — Ambulatory Visit: Payer: Self-pay | Admitting: Internal Medicine

## 2010-04-04 LAB — CONVERTED CEMR LAB: POC INR: 2.8

## 2010-04-23 ENCOUNTER — Ambulatory Visit: Payer: Self-pay | Admitting: Cardiovascular Disease

## 2010-04-23 ENCOUNTER — Ambulatory Visit: Payer: Self-pay | Admitting: Cardiology

## 2010-04-23 LAB — CONVERTED CEMR LAB: POC INR: 2.8

## 2010-04-26 ENCOUNTER — Emergency Department (HOSPITAL_COMMUNITY): Admission: EM | Admit: 2010-04-26 | Discharge: 2010-04-26 | Payer: Self-pay | Admitting: Emergency Medicine

## 2010-05-15 ENCOUNTER — Ambulatory Visit: Payer: Self-pay | Admitting: Internal Medicine

## 2010-06-03 ENCOUNTER — Ambulatory Visit: Payer: Self-pay | Admitting: Cardiology

## 2010-06-07 ENCOUNTER — Telehealth: Payer: Self-pay | Admitting: Cardiology

## 2010-06-18 ENCOUNTER — Ambulatory Visit: Payer: Self-pay | Admitting: Internal Medicine

## 2010-06-25 ENCOUNTER — Ambulatory Visit: Admit: 2010-06-25 | Payer: Self-pay | Admitting: Cardiology

## 2010-06-27 ENCOUNTER — Ambulatory Visit: Admission: RE | Admit: 2010-06-27 | Discharge: 2010-06-27 | Payer: Self-pay | Source: Home / Self Care

## 2010-07-14 ENCOUNTER — Encounter: Payer: Self-pay | Admitting: Orthopedic Surgery

## 2010-07-21 LAB — CONVERTED CEMR LAB
ALT: 19 units/L (ref 0–35)
AST: 28 units/L (ref 0–37)
Albumin: 4 g/dL (ref 3.5–5.2)
Alkaline Phosphatase: 109 units/L (ref 39–117)
Calcium: 9.4 mg/dL (ref 8.4–10.5)
Cholesterol: 271 mg/dL — ABNORMAL HIGH (ref 0–200)
Creatinine, Ser: 0.9 mg/dL (ref 0.4–1.2)
Digitoxin Lvl: 1.4 ng/mL (ref 0.8–2.0)
Direct LDL: 203.5 mg/dL
GFR calc non Af Amer: 69.05 mL/min (ref >60)
Total Protein: 8.6 g/dL — ABNORMAL HIGH (ref 6.0–8.3)
VLDL: 32.6 mg/dL (ref 0.0–40.0)

## 2010-07-25 ENCOUNTER — Encounter: Payer: Self-pay | Admitting: Cardiology

## 2010-07-25 ENCOUNTER — Encounter (INDEPENDENT_AMBULATORY_CARE_PROVIDER_SITE_OTHER): Payer: Self-pay | Admitting: Pharmacist

## 2010-07-25 ENCOUNTER — Encounter (INDEPENDENT_AMBULATORY_CARE_PROVIDER_SITE_OTHER): Payer: BC Managed Care – PPO

## 2010-07-25 ENCOUNTER — Ambulatory Visit: Admit: 2010-07-25 | Payer: Self-pay

## 2010-07-25 DIAGNOSIS — I059 Rheumatic mitral valve disease, unspecified: Secondary | ICD-10-CM

## 2010-07-25 DIAGNOSIS — Z7901 Long term (current) use of anticoagulants: Secondary | ICD-10-CM

## 2010-07-25 NOTE — Medication Information (Signed)
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 no    Recent/future dental: no  Any missed doses?: no       Is patient compliant with meds? yes       Allergies: No Known Drug Allergies  Anticoagulation Management History:      The patient is taking warfarin and comes in today for a routine follow up visit.  Negative risk factors for bleeding include an age less than 3 years old.  The bleeding index is 'low risk'.  Positive CHADS2 values include History of HTN.  Negative CHADS2 values include Age > 12 years old.  The start date was 10/10/1997.  Her last INR was 3.5.  Anticoagulation responsible provider: Ladona Ridgel MD, Sharlot Gowda.  INR POC: 2.8.  Cuvette Lot#: [id number].  Exp: 04/2011.    Anticoagulation Management Assessment/Plan:      The patient's current anticoagulation dose is Coumadin 5 mg tabs: Use as directed by anticoagulation clinic..  The target INR is 3.0 - 3.5.  The next INR is due 04/23/2010.  Anticoagulation instructions were given to patient.  Results were reviewed/authorized by Bethena Midget, RN, BSN.  She was notified by Bethena Midget, RN, BSN.         Prior Anticoagulation Instructions: INR 3.7 Change your saturday dose to 1 tablet instead of 1.5 tablets. Take 1 tablet everyday. Recheck in 2 weeks.   Current Anticoagulation Instructions: INR 2.8 Today take 7.5mg s then change dose to 5mg s everyday except 7.5mg s on Thursdays. Recheck in 3 weeks.

## 2010-07-25 NOTE — Medication Information (Signed)
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 no    Recent/future dental: no  Any missed doses?: no       Is patient compliant with meds? yes       Allergies: No Known Drug Allergies  Anticoagulation Management History:      The patient is taking warfarin and comes in today for a routine follow up visit.  Negative risk factors for bleeding include an age less than 25 years old.  The bleeding index is 'low risk'.  Positive CHADS2 values include History of HTN.  Negative CHADS2 values include Age > 15 years old.  The start date was 10/10/1997.  Her last INR was 3.5.  Anticoagulation responsible provider: Eden Emms MD, Theron Arista.  INR POC: 2.8.  Cuvette Lot#: [id number].  Exp: 01/2011.    Anticoagulation Management Assessment/Plan:      The patient's current anticoagulation dose is Coumadin 5 mg tabs: Use as directed by anticoagulation clinic..  The target INR is 3.0 - 3.5.  The next INR is due 12/26/2009.  Anticoagulation instructions were given to patient.  Results were reviewed/authorized by Weston Brass, PharmD.  She was notified by Weston Brass PharmD.         Prior Anticoagulation Instructions: INR = 3.5  The patient is to continue with the same dose of coumadin.  This dosage includes: take 1 tablet every day except saturday take 1.5 tablets.   Current Anticoagulation Instructions: INR 2.8  Take 1 1/2 tablets today then resume same dose of 1 tablet every day except 1 1/2 tablets on Saturday

## 2010-07-25 NOTE — Medication Information (Signed)
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 no    Recent/future dental: no  Any missed doses?: no       Is patient compliant with meds? yes       Current Medications (verified): 1)  Simvastatin 40 Mg  Tabs (Simvastatin) .... Take 1 By Mouth Qhs 2)  Omeprazole 20 Mg  Cpdr (Omeprazole) .... Take 1 By Mouth Qd 3)  Digoxin 0.25 Mg Tabs (Digoxin) .Marland Kitchen.. 1 Tab Once Daily 4)  Toprol Xl 25 Mg Xr24h-Tab (Metoprolol Succinate) .Marland Kitchen.. 1 Tab Once Daily 5)  Coumadin 5 Mg Tabs (Warfarin Sodium) .... Use As Directed By Anticoagulation Clinic.  Allergies (verified): No Known Drug Allergies  Anticoagulation Management History:      The patient is taking warfarin and comes in today for a routine follow up visit.  Negative risk factors for bleeding include an age less than 60 years old.  The bleeding index is 'low risk'.  Positive CHADS2 values include History of HTN.  Negative CHADS2 values include Age > 17 years old.  The start date was 10/10/1997.  Her last INR was 3.8 and today's INR is 3.5.  Anticoagulation responsible provider: Myrtis Ser MD, Tinnie Gens.  Cuvette Lot#: [id number].  Exp: 01/2011.    Anticoagulation Management Assessment/Plan:      The patient's current anticoagulation dose is Coumadin 5 mg tabs: Use as directed by anticoagulation clinic..  The target INR is 3.0 - 3.5.  The next INR is due 12/03/2009.  Anticoagulation instructions were given to patient.  Results were reviewed/authorized by Loma Newton, PharmD.  She was  notified by Loma Newton.         Prior Anticoagulation Instructions: INR 3.4  Continue taking 1.5 tablets on Saturday and 1 tablet all other days.  Return to clinic in 2 weeks.    Current Anticoagulation Instructions: INR = 3.5  The patient is to continue with the same dose of coumadin.  This dosage includes: take 1 tablet every day except saturday take 1.5 tablets.

## 2010-07-25 NOTE — Medication Information (Signed)
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 no    Recent/future dental: no  Any missed doses?: no       Is patient compliant with meds? yes       Current Medications (verified): 1)  Simvastatin 40 Mg  Tabs (Simvastatin) .... Take 1 By Mouth Qhs 2)  Omeprazole 20 Mg  Cpdr (Omeprazole) .... Take 1 By Mouth Qd 3)  Digoxin 0.25 Mg Tabs (Digoxin) .Marland Kitchen.. 1 Tab Once Daily 4)  Toprol Xl 25 Mg Xr24h-Tab (Metoprolol Succinate) .Marland Kitchen.. 1 Tab Once Daily 5)  Coumadin 5 Mg Tabs (Warfarin Sodium) .... Use As Directed By Anticoagulation Clinic.  Allergies (verified): No Known Drug Allergies  Anticoagulation Management History:      The patient is taking warfarin and comes in today for a routine follow up visit.  Negative risk factors for bleeding include an age less than 56 years old.  The bleeding index is 'low risk'.  Positive CHADS2 values include History of HTN.  Negative CHADS2 values include Age > 47 years old.  The start date was 10/10/1997.  Her last INR was 3.8.  Anticoagulation responsible provider: Myrtis Ser MD, Tinnie Gens.  INR POC: 3.4.  Cuvette Lot#: [id number].  Exp: 11/2010.    Anticoagulation Management Assessment/Plan:      The patient's current anticoagulation dose is Coumadin 5 mg tabs: Use as directed by anticoagulation clinic..  The target INR is 3.0 - 3.5.  The next INR is due 11/05/2009.  Anticoagulation instructions were given to patient.  Results were reviewed/authorized by Eda Keys, PharmD.  She was  notified by Eda Keys.         Prior Anticoagulation Instructions: INR 2.7 Today take 7.5mg s then continue 5mg s everyday except 7.5mg s on Saturdays. Recheck in 2 weeks.   Current Anticoagulation Instructions: INR 3.4  Continue taking 1.5 tablets on Saturday and 1 tablet all other days.  Return to clinic in 2 weeks.

## 2010-07-25 NOTE — Medication Information (Signed)
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 no    Recent/future dental: no  Any missed doses?: yes     Details: Was off for 5 days.   Is patient compliant with meds? yes      Comments: Had Sx on Rt arm on 12/21 so was off coumadin for 5 days. Restarted on 12/21  Allergies: No Known Drug Allergies  Anticoagulation Management History:      The patient is taking warfarin and comes in today for a routine follow up visit.  Negative risk factors for bleeding include an age less than 24 years old.  The bleeding index is 'low risk'.  Positive CHADS2 values include History of HTN.  Negative CHADS2 values include Age > 46 years old.  The start date was 10/10/1997.  Her last INR was 3.5.  Anticoagulation responsible provider: Graciela Husbands MD, Viviann Spare.  INR POC: 2.2.  Cuvette Lot#: [id number].  Exp: 06/2011.    Anticoagulation Management Assessment/Plan:      The patient's current anticoagulation dose is Coumadin 5 mg tabs: Use as directed by anticoagulation clinic..  The target INR is 3.0 - 3.5.  The next INR is due 06/27/2010.  Anticoagulation instructions were given to patient.  Results were reviewed/authorized by Bethena Midget, RN, BSN.  She was notified by Bethena Midget, RN, BSN.         Prior Anticoagulation Instructions: Cont current regimen Return to clinic Jan 3, 230 pm  Current Anticoagulation Instructions: INR 2.2 Today take 2 pills then resume 1 pill everyday except 1.5 pills on Thursdays. Resume Lovenox 80mg s  injections twice a day for 2 days. Recheck in one week.

## 2010-07-25 NOTE — Medication Information (Signed)
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 no    Recent/future dental: no  Any missed doses?: no       Is patient compliant with meds? yes       Allergies: No Known Drug Allergies  Anticoagulation Management History:      The patient is taking warfarin and comes in today for a routine follow up visit.  Negative risk factors for bleeding include an age less than 30 years old.  The bleeding index is 'low risk'.  Positive CHADS2 values include History of HTN.  Negative CHADS2 values include Age > 66 years old.  The start date was 10/10/1997.  Her last INR was 3.8.  Anticoagulation responsible provider: Myrtis Ser MD, Tinnie Gens.  INR POC: 2.7.  Cuvette Lot#: [id number].  Exp: 11/2010.    Anticoagulation Management Assessment/Plan:      The patient's current anticoagulation dose is Coumadin 5 mg tabs: Use as directed by anticoagulation clinic..  The target INR is 3.0 - 3.5.  The next INR is due 10/22/2009.  Anticoagulation instructions were given to patient.  Results were reviewed/authorized by Bethena Midget, RN, BSN.  She was notified by Bethena Midget, RN, BSN.         Prior Anticoagulation Instructions: INR 2.0. Take 1.5 tablets today, then take 1 tablet daily except 1.5 tablets on Saturday.  Current Anticoagulation Instructions: INR 2.7 Today take 7.5mg s then continue 5mg s everyday except 7.5mg s on Saturdays. Recheck in 2 weeks.

## 2010-07-25 NOTE — Medication Information (Signed)
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 no    Recent/future dental: no  Any missed doses?: no       Is patient compliant with meds? yes       Allergies (verified): No Known Drug Allergies  Anticoagulation Management History:      The patient is taking warfarin and comes in today for a routine follow up visit.  Negative risk factors for bleeding include an age less than 13 years old.  The bleeding index is 'low risk'.  Positive CHADS2 values include History of HTN.  Negative CHADS2 values include Age > 65 years old.  The start date was 10/10/1997.  Her last INR was 3.8.  Anticoagulation responsible provider: Daleen Squibb MD, Maisie Fus.  INR POC: 3.7.  Cuvette Lot#: [id number].  Exp: 09/2010.    Anticoagulation Management Assessment/Plan:      The patient's current anticoagulation dose is Coumadin 5 mg tabs: Use as directed by anticoagulation clinic..  The target INR is 3.0 - 3.5.  The next INR is due 08/10/2009.  Anticoagulation instructions were given to patient.  Results were reviewed/authorized by Cloyde Reams, RN, BSN.  She was notified by Cloyde Reams RN.         Prior Anticoagulation Instructions: INR = 3.8  Take 0.5 tab each  Monday and 1 tab on other days.    Recheck in 4 weeks.    Current Anticoagulation Instructions: INR 3.7  Take 1/2 tablet today then resume same dosage 1 tablet daily except 1/2 tablet on Mondays.  Recheck in 4 weeks.

## 2010-07-25 NOTE — Medication Information (Signed)
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 no    Recent/future dental: no  Any missed doses?: no       Is patient compliant with meds? yes       Allergies: No Known Drug Allergies  Anticoagulation Management History:      The patient is taking warfarin and comes in today for a routine follow up visit.  Negative risk factors for bleeding include an age less than 43 years old.  The bleeding index is 'low risk'.  Positive CHADS2 values include History of HTN.  Negative CHADS2 values include Age > 42 years old.  The start date was 10/10/1997.  Her last INR was 3.8.  Anticoagulation responsible provider: Myrtis Ser MD, Tinnie Gens.  INR POC: 3.2.  Cuvette Lot#: [id number].  Exp: 09/2010.    Anticoagulation Management Assessment/Plan:      The patient's current anticoagulation dose is Coumadin 5 mg tabs: Use as directed by anticoagulation clinic..  The target INR is 3.0 - 3.5.  The next INR is due 09/03/2009.  Anticoagulation instructions were given to patient.  Results were reviewed/authorized by Eda Keys, PharmD.  She was notified by Eda Keys.         Prior Anticoagulation Instructions: INR 3.7  Take 1/2 tablet today then resume same dosage 1 tablet daily except 1/2 tablet on Mondays.  Recheck in 4 weeks.    Current Anticoagulation Instructions: INR 3.2  Continue current dosing schedule.  Take 1 tablet every day, except take 1/2 tablet on Monday.   Return to clinic in 4 weeks.

## 2010-07-25 NOTE — Medication Information (Signed)
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 no    Recent/future dental: no  Any missed doses?: no       Is patient compliant with meds? yes       Allergies: No Known Drug Allergies  Anticoagulation Management History:      The patient is taking warfarin and comes in today for a routine follow up visit.  Negative risk factors for bleeding include an age less than 13 years old.  The bleeding index is 'low risk'.  Positive CHADS2 values include History of HTN.  Negative CHADS2 values include Age > 63 years old.  The start date was 10/10/1997.  Her last INR was 3.5.  Anticoagulation responsible provider: Clifton James MD,Christopher.  INR POC: 2.8.  Cuvette Lot#: [id number].  Exp: 04/2011.    Anticoagulation Management Assessment/Plan:      The patient's current anticoagulation dose is Coumadin 5 mg tabs: Use as directed by anticoagulation clinic..  The target INR is 3.0 - 3.5.  The next INR is due 05/14/2010.  Anticoagulation instructions were given to patient.  Results were reviewed/authorized by Bethena Midget, RN, BSN.         Prior Anticoagulation Instructions: INR 2.8 Today take 7.5mg s then change dose to 5mg s everyday except 7.5mg s on Thursdays. Recheck in 3 weeks.   Current Anticoagulation Instructions: INR 2.8 Take 1.5 tablets today. Then resume normal dosing schedule of 1.5 tablets on thursday. And 1 tablet all other days. Recheck in 3 weeks.

## 2010-07-25 NOTE — Medication Information (Signed)
Summary: rov/sp  Anticoagulant Therapy  Managed by: Bethena Midget, RN, BSN Referring MD: Valera Castle MD PCP: Loma Sender Supervising MD: Shirlee Latch MD, Skylee Baird Indication 1: Mitral Valve Replacement (ICD-V43.3) Indication 2: Mitral Valve Disorder (ICD-424.0) Lab Used: [facility] [place] Site: [address] INR POC 3.3 INR RANGE 3 - 3.5  Dietary changes: no    Health status changes: no    Bleeding/hemorrhagic complications: no    Recent/future hospitalizations: no    Any changes in medication regimen? no    Recent/future dental: no  Any missed doses?: no       Is patient compliant with meds? yes       Allergies: No Known Drug Allergies  Anticoagulation Management History:      The patient is taking warfarin and comes in today for a routine follow up visit.  Negative risk factors for bleeding include an age less than 55 years old.  The bleeding index is 'low risk'.  Positive CHADS2 values include History of HTN.  Negative CHADS2 values include Age > 19 years old.  The start date was 10/10/1997.  Her last INR was 3.5.  Anticoagulation responsible provider: Shirlee Latch MD, Teryl Gubler.  INR POC: 3.3.  Cuvette Lot#: [id number].  Exp: 02/2011.    Anticoagulation Management Assessment/Plan:      The patient's current anticoagulation dose is Coumadin 5 mg tabs: Use as directed by anticoagulation clinic..  The target INR is 3.0 - 3.5.  The next INR is due 01/23/2010.  Anticoagulation instructions were given to patient.  Results were reviewed/authorized by Bethena Midget, RN, BSN.  She was notified by Bethena Midget, RN, BSN.         Prior Anticoagulation Instructions: INR 2.8  Take 1 1/2 tablets today then resume same dose of 1 tablet every day except 1 1/2 tablets on Saturday  Current Anticoagulation Instructions: INR 3.3 Continue 5mg s everyday except 7.5mg s on Saturdays. Recheck in 4 weeks.

## 2010-07-25 NOTE — Medication Information (Signed)
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 no    Recent/future dental: no  Any missed doses?: no       Is patient compliant with meds? yes       Allergies: No Known Drug Allergies  Anticoagulation Management History:      The patient is taking warfarin and comes in today for a routine follow up visit.  Negative risk factors for bleeding include an age less than 76 years old.  The bleeding index is 'low risk'.  Positive CHADS2 values include History of HTN.  Negative CHADS2 values include Age > 73 years old.  The start date was 10/10/1997.  Her last INR was 3.5.  Anticoagulation responsible [name]: Bensimhon MD, Reuel Boom.  INR POC: 3.9.  Cuvette Lot#: [id number].  Exp: 05/2011.    Anticoagulation Management Assessment/Plan:      The patient's current anticoagulation dose is Coumadin 5 mg tabs: Use as directed by anticoagulation clinic..  The target INR is 3.0 - 3.5.  The next INR is due 06/05/2010.  Anticoagulation instructions were given to patient.  Results were reviewed/authorized by Weston Brass, PharmD.  She was notified by Hoy Register, PharmD Candidate.         Prior Anticoagulation Instructions: INR 2.8 Take 1.5 tablets today. Then resume normal dosing schedule of 1.5 tablets on thursday. And 1 tablet all other days. Recheck in 3 weeks.  Current Anticoagulation Instructions: INR 3.9 Take 1/2 tablet  today then resume previous dose of 1 tablet everyday except 1.5 tablet on Thursday Recheck INR in 3 weeks

## 2010-07-25 NOTE — Medication Information (Signed)
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 no    Recent/future dental: no  Any missed doses?: no       Is patient compliant with meds? yes       Allergies: No Known Drug Allergies  Anticoagulation Management History:      The patient is taking warfarin and comes in today for a routine follow up visit.  Negative risk factors for bleeding include an age less than 16 years old.  The bleeding index is 'low risk'.  Positive CHADS2 values include History of HTN.  Negative CHADS2 values include Age > 79 years old.  The start date was 10/10/1997.  Her last INR was 3.5.  Anticoagulation responsible provider: Graciela Husbands MD, Viviann Spare.  INR POC: 4.0.  Cuvette Lot#: [id number].  Exp: 03/2011.    Anticoagulation Management Assessment/Plan:      The patient's current anticoagulation dose is Coumadin 5 mg tabs: Use as directed by anticoagulation clinic..  The target INR is 3.0 - 3.5.  The next INR is due 03/21/2010.  Anticoagulation instructions were given to patient.  Results were reviewed/authorized by Cloyde Reams, RN, BSN.  She was notified by Cloyde Reams RN.         Prior Anticoagulation Instructions: INR 2.9 Today 7.5mg  then resume 5mg s everyday except 7.5mg s on Saturdays. Recheck in 4 weeks.   Current Anticoagulation Instructions: INR 4.0  Take 1/2 tablet today, then resume same dosage 1 tablet daily except 1.5 tablets on Saturdays.  Recheck in 3-4 weeks.

## 2010-07-25 NOTE — Letter (Signed)
Summary: Generic Letter  Architectural technologist, Main Office  [address]   [place], [place] [address]   Phone: [phone]  Fax: [phone]        July 17, 2009 MRN: [id number]    [facility] [address] [place], [place]  [address]    Dear Ms. Tays,  UNABLE TO REACH VIA PHONE RE: ECHO RESULTS. DR WALL REVIEWED  TEST RESULTS SHOW STABLE WITH NO CHANGE IN TREATMENT.        Sincerely,  TOM WALL MD/Dwight Burdo, LPN  This letter has been electronically signed by your physician.

## 2010-07-25 NOTE — Progress Notes (Signed)
Summary: need to stop Coumadin  Phone Note Call from Patient Call back at Home Phone [phone] Call back at [phone]   Summary of Call: Pt having surgery on 06/12/10 and need to know when she need to stop Coumadin Initial call taken by: Judie Grieve,  June 07, 2010 1:06 PM  Follow-up for Phone Call        Pt is having plate in arm removed by Dr. Luiz Blare.  Per Dr. Daleen Squibb- pt is cleared for surgery but needs Lovenox bridge.  See below.  Pt is aware of instructions.   12/16- No Coumadin today 12/17- No Coumadin 12/18- Lovenox 80mg  two times a day  12/19- Lovenox 80mg  two times a day 12/20- Lovenox 80mg  two times a day (Take last dose by 10pm) 12/21- Surgery.  Restart Coumadin and Lovenox when okay with surgeon.  Continue Lovenox 80mg  two times a day.  Take  Coumadin 2 tablets x 2 day then resume normal dose of 1 tablet every day except 1 1/2 tablets on Thursday.  Recheck INR on 12/27.      New/Updated Medications: ENOXAPARIN SODIUM 80 MG/0.8ML SOLN (ENOXAPARIN SODIUM) Inject 1 syringe subcutaneously two times a day as directed Prescriptions: ENOXAPARIN SODIUM 80 MG/0.8ML SOLN (ENOXAPARIN SODIUM) Inject 1 syringe subcutaneously two times a day as directed  #20 x 1   Entered by:   Weston Brass PharmD   Authorized by:   Gaylord Shih, MD, [facility]   Signed by:   Weston Brass PharmD on 06/07/2010   Method used:   Electronically to        [facility] (retail)       [address], [address]       [place], [place]  [address]       Ph: [phone]       Fax: [phone]   RxID:   [id number]   Appended Document: need to stop Coumadin Faxed to [facility] at [facility] (fax: [phone])

## 2010-07-25 NOTE — Medication Information (Signed)
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  When had BM sl amt of BRB in stool.  None since.  Pt will continue to monitor and call with problems.  Any changes in medication regimen? no    Recent/future dental: no  Any missed doses?: no       Is patient compliant with meds? yes       Allergies (verified): No Known Drug Allergies  Anticoagulation Management History:      The patient is taking warfarin and comes in today for a routine follow up visit.  Negative risk factors for bleeding include an age less than 34 years old.  The bleeding index is 'low risk'.  Positive CHADS2 values include History of HTN.  Negative CHADS2 values include Age > 69 years old.  The start date was 10/10/1997.  Her last INR was 3.8.  Anticoagulation responsible provider: Tenny Craw MD, Gunnar Fusi.  INR POC: 2.8.  Cuvette Lot#: [id number].  Exp: 10/2010.    Anticoagulation Management Assessment/Plan:      The patient's current anticoagulation dose is Coumadin 5 mg tabs: Use as directed by anticoagulation clinic..  The target INR is 3.0 - 3.5.  The next INR is due 10/01/2009.  Anticoagulation instructions were given to patient.  Results were reviewed/authorized by Cloyde Reams, RN, BSN.  She was notified by Cloyde Reams RN.         Prior Anticoagulation Instructions: INR 3.2  Continue current dosing schedule.  Take 1 tablet every day, except take 1/2 tablet on Monday.   Return to clinic in 4 weeks.    Current  Anticoagulation Instructions: INR 2.8  Take 1 tablet today then resume same dosage 1 tablet daily except 1/2 tablet on Mondays.  Recheck in 4 weeks.

## 2010-07-25 NOTE — Letter (Signed)
Summary: Work Excuse  Coumadin  [address]   [place], [place] [address]   Phone: [phone]  Fax: [phone]     February 21, 2010    [facility] [place]   The above named patient had a medical visit today at:   4:15 pm.  Please take this into consideration when reviewing the time away from work/school.      Sincerely yours,  Architectural technologist

## 2010-07-25 NOTE — Letter (Signed)
Summary: Work Excuse  Coumadin  [address]   [place], [place] [address]   Phone: [phone]  Fax: [phone]     March 21, 2010    [facility] [place]   The above named patient had a medical visit today at:  4:15 pm.  Please take this into consideration when reviewing the time away from work.      Sincerely yours,  Architectural technologist

## 2010-07-25 NOTE — Assessment & Plan Note (Signed)
Summary: 6 mo f/u ./cy   Visit Type:  6 mo f/u Primary Provider:  Loma Sender  CC:  pt offers no cardaic complaints today.  History of Present Illness: [name] returns today for the management of her cardiac problems.  She's doing remarkably well without any symptoms of palpitations, presyncope, or syncope. She's had no significant dyspnea on exertion, orthopnea, PND or edema. She denies any bleeding or melena.  Her INR today is 2.8. Echocardiogram in January of this year showed a stable mechanical mitral valve, left eye 2 large man, normal left ventricular chamber size and function, no evidence of pulmonary hypertension.  She's very compliant with her medications.  Current Medications (verified): 1)  Simvastatin 40 Mg  Tabs (Simvastatin) .... Take 1 By Mouth Qhs 2)  Omeprazole 20 Mg  Cpdr (Omeprazole) .... Take 1 By Mouth Qd 3)  Digoxin 0.25 Mg Tabs (Digoxin) .Marland Kitchen.. 1 Tab Once Daily 4)  Toprol Xl 25 Mg Xr24h-Tab (Metoprolol Succinate) .Marland Kitchen.. 1 Tab Once Daily 5)  Coumadin 5 Mg Tabs (Warfarin Sodium) .... Use As Directed By Anticoagulation Clinic.  Allergies (verified): No Known Drug Allergies  Past History:  Past Medical History: Last updated: 10/17/2008 ATRIAL FIBRILLATION (ICD-427.31) COUMADIN THERAPY (ICD-V58.61) RHEUMATIC HEART DISEASE, HX OF (ICD-V12.59) HYPERTENSION, UNSPECIFIED (ICD-401.9) PULMONARY HYPERTENSION, MILD (ICD-416.8) HYPERCHOLESTEROLEMIA (ICD-272.0) GERD (ICD-530.81) GOITER, MULTINODULAR (ICD-241.1)    Past Surgical History: Last updated: 10/17/2008 wrist arthroscopy 12/10/2007  Endoscopic retrograde cholangiogram with stent removal. 09/09/2007  Endoscopic retrograde cholangiopancreatography with stent placement and stone removal. 08/18/2007  Carpal tunnel release. 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   Risk Factors: Smoking Status: never (10/17/2008)  Review of Systems       negative other than history of present illness  Vital Signs:  Patient profile:   55 year old female Height:      64 inches Weight:      177 pounds BMI:     30.49 Pulse rate:   78 / minute Pulse rhythm:   regular BP sitting:   128 / 80  (left arm) Cuff size:   large  Vitals Entered By: Danielle Rankin, CMA (April 23, 2010 3:34 PM)  Physical Exam  General:  obese.   Head:  normocephalic and atraumatic Eyes:  PERRLA/EOM intact; conjunctiva and lids normal. Neck:  Neck supple, no JVD. No masses, thyromegaly or abnormal cervical nodes. Chest Wall:  no deformities or breast masses noted Lungs:  Clear bilaterally to auscultation and percussion. Heart:  irregular rate and rhythm, mechanical S1, S2 splits, no right ventricular lift. Carotid strokes equal bilaterally without bruits Msk:  Back normal, normal gait. Muscle strength and tone normal. Pulses:  pulses normal in all 4 extremities Extremities:  No clubbing or cyanosis. Neurologic:  Alert and oriented x 3. Skin:  Intact without lesions or rashes. Psych:  Normal affect.   Impression & Recommendations:  Problem # 1:  MITRAL VALVE REPLACEMENT, HX OF (ICD-V15.1) Assessment Unchanged  Problem # 2:  COUMADIN THERAPY (ICD-V58.61) Assessment: Unchanged  Problem # 3:  ATRIAL FIBRILLATION (ICD-427.31) Assessment: Unchanged  Her updated medication list for this problem includes:    Digoxin 0.25 Mg Tabs (Digoxin) .Marland Kitchen... 1 tab once daily    Toprol Xl 25 Mg Xr24h-tab (Metoprolol succinate) .Marland Kitchen... 1 tab once daily    Coumadin 5 Mg Tabs (Warfarin  sodium) ..... Use as directed by anticoagulation clinic.  Problem # 4:  HYPERTENSION, UNSPECIFIED (ICD-401.9) Assessment: Improved  Her updated medication list for this problem includes:    Toprol Xl 25 Mg Xr24h-tab (Metoprolol  succinate) .Marland Kitchen... 1 tab once daily  Problem # 5:  PULMONARY HYPERTENSION, MILD (ICD-416.8) Assessment: Unchanged  Problem # 6:  HYPERCHOLESTEROLEMIA (ICD-272.0) Assessment: Improved  Her updated medication list for this problem includes:    Simvastatin 40 Mg Tabs (Simvastatin) .Marland Kitchen... Take 1 by mouth qhs  Patient Instructions: 1)  Your physician recommends that you schedule a follow-up appointment in: 6 months

## 2010-07-25 NOTE — Medication Information (Signed)
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 no    Recent/future dental: no  Any missed doses?: no       Is patient compliant with meds? yes       Allergies: No Known Drug Allergies  Anticoagulation Management History:      The patient is taking warfarin and comes in today for a routine follow up visit.  Negative risk factors for bleeding include an age less than 68 years old.  The bleeding index is 'low risk'.  Positive CHADS2 values include History of HTN.  Negative CHADS2 values include Age > 61 years old.  The start date was 10/10/1997.  Her last INR was 3.5.  Anticoagulation responsible provider: Eden Emms MD, Theron Arista.  INR POC: 2.9.  Cuvette Lot#: [id number].  Exp: 03/2011.    Anticoagulation Management Assessment/Plan:      The patient's current anticoagulation dose is Coumadin 5 mg tabs: Use as directed by anticoagulation clinic..  The target INR is 3.0 - 3.5.  The next INR is due 02/21/2010.  Anticoagulation instructions were given to patient.  Results were reviewed/authorized by Bethena Midget, RN, BSN.  She was notified by Bethena Midget, RN, BSN.         Prior Anticoagulation Instructions: INR 3.3 Continue 5mg s everyday except 7.5mg s on Saturdays. Recheck in 4 weeks.   Current Anticoagulation Instructions: INR 2.9 Today 7.5mg  then resume 5mg s everyday except 7.5mg s on Saturdays. Recheck in 4 weeks.

## 2010-07-25 NOTE — Letter (Signed)
Summary: Work Excuse  Coumadin  [address]   [place], [place] [address]   Phone: [phone]  Fax: [phone]     October 01, 2009    [facility] [place]   The above named patient had a medical visit today at:      am / pm.  Please take this into consideration when reviewing the time away from work/school.      Sincerely yours,  Architectural technologist

## 2010-07-25 NOTE — Medication Information (Signed)
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 no    Recent/future dental: no  Any missed doses?: no       Is patient compliant with meds? yes       Allergies: No Known Drug Allergies  Anticoagulation Management History:      The patient is taking warfarin and comes in today for a routine follow up visit.  Negative risk factors for bleeding include an age less than 17 years old.  The bleeding index is 'low risk'.  Positive CHADS2 values include History of HTN.  Negative CHADS2 values include Age > 34 years old.  The start date was 10/10/1997.  Her last INR was 3.5.  Anticoagulation responsible provider: Myrtis Ser MD, Tinnie Gens.  INR POC: 3.1.  Cuvette Lot#: [id number].  Exp: 01/2011.    Anticoagulation Management Assessment/Plan:      The patient's current anticoagulation dose is Coumadin 5 mg tabs: Use as directed by anticoagulation clinic..  The target INR is 3.0 - 3.5.  The next INR is due 07/25/2010.  Anticoagulation instructions were given to patient.  Results were reviewed/authorized by Bethena Midget, RN, BSN.  She was notified by Weston Brass PharmD.         Prior Anticoagulation Instructions: INR 2.2 Today take 2 pills then resume 1 pill everyday except 1.5 pills on Thursdays. Resume Lovenox 80mg s injections twice a day for 2 days. Recheck in one week.   Current Anticoagulation Instructions: INR 3.1 Continue 5mg s everyday except 7.5mg s on Thursdays.  Recheck in 4 weeks.

## 2010-07-25 NOTE — Medication Information (Signed)
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 no    Recent/future dental: no  Any missed doses?: no       Is patient compliant with meds? yes       Allergies: No Known Drug Allergies  Anticoagulation Management History:      The patient is taking warfarin and comes in today for a routine follow up visit.  Negative risk factors for bleeding include an age less than 2 years old.  The bleeding index is 'low risk'.  Positive CHADS2 values include History of HTN.  Negative CHADS2 values include Age > 90 years old.  The start date was 10/10/1997.  Her last INR was 3.8.  Anticoagulation responsible provider: Clifton James MD, Cristal Deer.  INR POC: 2.0.  Cuvette Lot#: [id number].  Exp: 10/2010.    Anticoagulation Management Assessment/Plan:      The patient's current anticoagulation dose is Coumadin 5 mg tabs: Use as directed by anticoagulation clinic..  The target INR is 3.0 - 3.5.  The next INR is due 10/11/2009.  Anticoagulation instructions were given to patient.  Results were reviewed/authorized by Elaina Pattee, PharmD.  She was notified by Elaina Pattee, PharmD.         Prior Anticoagulation Instructions: INR 2.8  Take 1 tablet today then resume same dosage 1 tablet daily except 1/2 tablet on Mondays.  Recheck in 4 weeks.    Current Anticoagulation Instructions: INR 2.0. Take 1.5 tablets today and tomorrow, then take 1 tablet daily except 1.5 tablets on Saturdays.  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  The bleeding index is 'low risk'.  Positive CHADS2 values include History of HTN.  Negative CHADS2 values include Age > 81 years old.  The start date was 10/10/1997.  Her last INR was 3.8.  Anticoagulation responsible provider: Clifton James MD, Cristal Deer.  Exp: 10/2010.    Anticoagulation Management Assessment/Plan:      The patient's current anticoagulation dose is Coumadin 5 mg tabs: Use as directed by anticoagulation clinic..  The target INR is 3.0 - 3.5.  The next INR is due 10/11/2009.  Anticoagulation instructions were given to patient.  Results were reviewed/authorized by Elaina Pattee, PharmD.         Prior Anticoagulation Instructions: INR 2.0. Take 1.5 tablets today and tomorrow, then take 1 tablet daily except 1.5 tablets on Saturdays.  Current Anticoagulation Instructions: INR 2.0. Take 1.5 tablets today, then take 1 tablet daily except 1.5 tablets on Saturday.

## 2010-07-25 NOTE — Letter (Signed)
Summary: Work Excuse  Coumadin  [address]   [place], [place] [address]   Phone: [phone]  Fax: [phone]    Today's Date: April 04, 2010  Name of Patient: [name]  The above named patient had a medical visit today   Please take this into consideration when reviewing the time away from work.   Special Instructions:  [  x] None  [  ] To be off the remainder of today, returning to the normal work / school schedule tomorrow.  [  ] To be off until the next scheduled appointment on ______________________.  [  ] Other ________________________________________________________________ ________________________________________________________________________   Sincerely yours,   Bethena Midget, RN, BSN

## 2010-07-25 NOTE — Medication Information (Signed)
Summary: rov/nb  Anticoagulant Therapy  Managed by: Samantha Crimes, PharmD Referring MD: Valera Castle MD PCP: Loma Sender Supervising MD: Gala Romney MD, Reuel Boom Indication 1: Mitral Valve Replacement (ICD-V43.3) Indication 2: Mitral Valve Disorder (ICD-424.0) Lab Used: [facility] [place] Site: [address] INR POC 3.5 INR RANGE 3 - 3.5  Dietary changes: no    Health status changes: yes       Details: allergies  Bleeding/hemorrhagic complications: no    Recent/future hospitalizations: no    Any changes in medication regimen? no    Recent/future dental: no  Any missed doses?: no       Is patient compliant with meds? yes       Current Medications (verified): 1)  Simvastatin 40 Mg  Tabs (Simvastatin) .... Take 1 By Mouth Qhs 2)  Omeprazole 20 Mg  Cpdr (Omeprazole) .... Take 1 By Mouth Qd 3)  Digoxin 0.25 Mg Tabs (Digoxin) .Marland Kitchen.. 1 Tab Once Daily 4)  Toprol Xl 25 Mg Xr24h-Tab (Metoprolol Succinate) .Marland Kitchen.. 1 Tab Once Daily 5)  Coumadin 5 Mg Tabs (Warfarin Sodium) .... Use As Directed By Anticoagulation Clinic.  Allergies (verified): No Known Drug Allergies  Anticoagulation Management History:      Negative risk factors for bleeding include an age less than 45 years old.  The bleeding index is 'low risk'.  Positive CHADS2 values include History of HTN.  Negative CHADS2 values include Age > 12 years old.  The start date was 10/10/1997.  Her last INR was 3.5.  Anticoagulation responsible Kaya Pottenger: Bensimhon MD, Reuel Boom.  INR POC: 3.5.  Exp: 05/2011.    Anticoagulation Management Assessment/Plan:      The patient's current anticoagulation dose is Coumadin 5 mg tabs: Use as directed by anticoagulation clinic..  The target INR is 3.0 - 3.5.  The next INR is due 06/05/2010.  Anticoagulation instructions were given to patient.  Results were reviewed/authorized by Samantha Crimes, PharmD.         Prior Anticoagulation Instructions: INR 3.9 Take 1/2 tablet today then resume  previous dose of 1 tablet everyday except 1.5 tablet on Thursday Recheck INR in 3 weeks  Current Anticoagulation Instructions: Cont current regimen Return to clinic Jan 3, 230 pm

## 2010-07-25 NOTE — Medication Information (Signed)
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 yes       Details: topical triamcinolone  Recent/future dental: no  Any missed doses?: no       Is patient compliant with meds? yes       Allergies: No Known Drug Allergies  Anticoagulation Management History:      The patient is taking warfarin and comes in today for a routine follow up visit.  Negative risk factors for bleeding include an age less than 20 years old.  The bleeding index is 'low risk'.  Positive CHADS2 values include History of HTN.  Negative CHADS2 values include Age > 50 years old.  The start date was 10/10/1997.  Her last INR was 3.5.  Anticoagulation responsible Terriyah Westra: Shirlee Latch MD,Dalton.  INR POC: 3.7.  Cuvette Lot#: [id number].  Exp: 03/2011.    Anticoagulation Management Assessment/Plan:      The patient's current anticoagulation dose is Coumadin 5 mg tabs: Use as directed by anticoagulation clinic..  The target INR is 3.0 - 3.5.  The next INR is due 04/04/2010.  Anticoagulation instructions were given to patient.  Results were reviewed/authorized by Lyna Poser, PharmD.         Prior Anticoagulation Instructions: INR 4.0  Take 1/2 tablet today, then resume same dosage 1 tablet daily except 1.5 tablets on Saturdays.  Recheck in 3-4 weeks.    Current Anticoagulation Instructions: INR 3.7 Change your saturday dose to 1 tablet instead of 1.5 tablets. Take 1 tablet everyday.  Recheck in 2 weeks.

## 2010-07-25 NOTE — Assessment & Plan Note (Signed)
Summary: per check out/sf   Visit Type:  6 mo f/u Primary Provider:  Loma Sender  CC:  no cardiac complaints today.  History of Present Illness: [name] comes in today for evaluation management of her cardiac problems. Please see the assessment and plan.  She's having no symptoms whatsoever. She has lost weight and is walking about 20 miles a week. She is compliant with her medications. Coumadin level will be checked today.  Specifically, she denies any palpitations, chest pain, syncope or presyncope. She reports no bleeding.  Current Medications (verified): 1)  Simvastatin 40 Mg  Tabs (Simvastatin) .... Take 1 By Mouth Qhs 2)  Omeprazole 20 Mg  Cpdr (Omeprazole) .... Take 1 By Mouth Qd 3)  Digoxin 0.25 Mg Tabs (Digoxin) .Marland Kitchen.. 1 Tab Once Daily 4)  Toprol Xl 25 Mg Xr24h-Tab (Metoprolol Succinate) .Marland Kitchen.. 1 Tab Once Daily 5)  Coumadin 5 Mg Tabs (Warfarin Sodium) .... Use As Directed By Anticoagulation Clinic.  Allergies (verified): No Known Drug Allergies  Past History:  Past Medical History: Last updated: 10/17/2008 ATRIAL FIBRILLATION (ICD-427.31) COUMADIN THERAPY (ICD-V58.61) RHEUMATIC HEART DISEASE, HX OF (ICD-V12.59) HYPERTENSION, UNSPECIFIED (ICD-401.9) PULMONARY HYPERTENSION, MILD (ICD-416.8) HYPERCHOLESTEROLEMIA (ICD-272.0) GERD (ICD-530.81) GOITER, MULTINODULAR (ICD-241.1)    Past Surgical History: Last updated: 10/17/2008 wrist arthroscopy 12/10/2007  Endoscopic retrograde cholangiogram with stent removal. 09/09/2007  Endoscopic retrograde cholangiopancreatography with stent placement and stone removal. 08/18/2007  Carpal tunnel release. 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   Risk Factors: Smoking Status: never (10/17/2008)  Review  of Systems       negative other than history of present illness  Vital Signs:  Patient profile:   55 year old female Height:      64 inches Weight:      177 pounds BMI:     30.49 Pulse rate:   72 / minute Pulse rhythm:   regular BP sitting:   122 / 80  (left arm) Cuff size:   large  Vitals Entered By: Danielle Rankin, CMA (Oct 22, 2009 8:51 AM)  Physical Exam  General:  she is clearly lost weight, no acute distress Head:  normocephalic and atraumatic Eyes:  PERRLA/EOM intact; conjunctiva and lids normal. Mouth:  Teeth, gums and palate normal. Oral mucosa normal. Neck:  Neck supple, no JVD. No masses, thyromegaly or abnormal cervical nodes. Lungs:  Clear bilaterally to auscultation and percussion. Heart:  irregular rate and rhythm, there will S1-S2, prosthetic S1, soft systolic murmur, no diastolic murmur, carotids full without bruits Abdomen:  Bowel sounds positive; abdomen soft and non-tender without masses, organomegaly, or hernias noted. No hepatosplenomegaly. Pulses:  pulses normal in all 4 extremities Extremities:  No clubbing or cyanosis. Neurologic:  Alert and oriented x 3. Skin:  Intact without lesions or rashes. Psych:  Normal affect.   Impression & Recommendations:  Problem # 1:  MITRAL VALVE REPLACEMENT, HX OF (ICD-V15.1) Assessment Unchanged  Problem # 2:  COUMADIN THERAPY (ICD-V58.61) Assessment: Unchanged  Problem # 3:  ATRIAL FIBRILLATION (ICD-427.31) Assessment: Unchanged  Her updated medication list for this problem includes:    Digoxin 0.25 Mg Tabs (Digoxin) .Marland Kitchen... 1 tab once daily    Toprol Xl 25 Mg Xr24h-tab (Metoprolol succinate) .Marland Kitchen... 1 tab once daily    Coumadin 5 Mg Tabs (Warfarin sodium) ..... Use as directed by anticoagulation clinic.  Problem #  4:  RHEUMATIC HEART DISEASE, HX OF (ICD-V12.59) Assessment: Unchanged  Her updated medication list for this problem includes:    Toprol Xl 25 Mg Xr24h-tab (Metoprolol succinate) .Marland Kitchen... 1 tab once  daily    Coumadin 5 Mg Tabs (Warfarin sodium) ..... Use as directed by anticoagulation clinic.  Problem # 5:  HYPERTENSION, UNSPECIFIED (ICD-401.9) Assessment: Improved  Her updated medication list for this problem includes:    Toprol Xl 25 Mg Xr24h-tab (Metoprolol succinate) .Marland Kitchen... 1 tab once daily  Problem # 6:  PULMONARY HYPERTENSION, MILD (ICD-416.8) Assessment: Unchanged  Problem # 7:  HYPERCHOLESTEROLEMIA (ICD-272.0)  Her updated medication list for this problem includes:    Simvastatin 40 Mg Tabs (Simvastatin) .Marland Kitchen... Take 1 by mouth qhs  Patient Instructions: 1)  Your physician recommends that you schedule a follow-up appointment in: 6 MONTHS WITH DR Creta Dorame 2)  Your physician recommends that you continue on your current medications as directed. Please refer to the Current Medication list given to you today. Prescriptions: COUMADIN 5 MG TABS (WARFARIN SODIUM) Use as directed by anticoagulation clinic.  #35 x 3   Entered by:   Scherrie Bateman, LPN   Authorized by:   Gaylord Shih, MD, [facility]   Signed by:   Scherrie Bateman, LPN on 16/03/9603   Method used:   Electronically to        [facility] [address] (retail)       [address], [address]       [place], [place]  [address]       Ph: [phone]       Fax: [phone]   RxID:   [id number] TOPROL XL 25 MG XR24H-TAB (METOPROLOL SUCCINATE) 1 tab once daily  #30 x 11   Entered by:   Scherrie Bateman, LPN   Authorized by:   Gaylord Shih, MD, [facility]   Signed by:   Scherrie Bateman, LPN on 04/19/2535   Method used:   Electronically to        [facility] [address] (retail)       [address], [address]       [place], [place]  [address]       Ph: [phone]       Fax: [phone]   RxID:   [id number] DIGOXIN 0.25 MG TABS (DIGOXIN) 1 tab once daily  #30 x 11   Entered by:   Scherrie Bateman, LPN   Authorized by:   Gaylord Shih, MD, [facility]   Signed by:   Scherrie Bateman, LPN on 32/35/5732   Method used:   Electronically to        [facility] [address] (retail)       [address], [address]       [place], [place]  [address]       Ph: [phone]       Fax: [phone]   RxID:   [id number] SIMVASTATIN 40 MG  TABS (SIMVASTATIN) TAKE 1 by mouth QHS  #30 x 11   Entered by:   Scherrie Bateman, LPN   Authorized by:   Gaylord Shih, MD, [facility]   Signed by:   Scherrie Bateman, LPN on 35/00/9381   Method used:   Electronically to        [facility] [address] (retail)  [address]       [place], [place]  [address]       Ph: [phone]       Fax: [phone]   RxID:   [id number]

## 2010-07-31 NOTE — Medication Information (Signed)
Summary: rov/ewj  Anticoagulant Therapy  Managed by: Cloyde Reams, RN, BSN Referring MD: Valera Castle MD PCP: Loma Sender Supervising MD: Shirlee Latch MD, Freida Busman Indication 1: Mitral Valve Replacement (ICD-V43.3) Indication 2: Mitral Valve Disorder (ICD-424.0) Lab Used: [facility] [place] Site: [address] INR POC 4.6 INR RANGE 3 - 3.5  Dietary changes: no    Health status changes: no    Bleeding/hemorrhagic complications: no    Recent/future hospitalizations: no    Any changes in medication regimen? no    Recent/future dental: no  Any missed doses?: no       Is patient compliant with meds? yes       Allergies: No Known Drug Allergies  Anticoagulation Management History:      The patient is taking warfarin and comes in today for a routine follow up visit.  Negative risk factors for bleeding include an age less than 71 years old.  The bleeding index is 'low risk'.  Positive CHADS2 values include History of HTN.  Negative CHADS2 values include Age > 32 years old.  The start date was 10/10/1997.  Her last INR was 3.5.  Anticoagulation responsible provider: Shirlee Latch MD, Adalyn Pennock.  INR POC: 4.6.  Cuvette Lot#: [id number].  Exp: 06/2011.    Anticoagulation Management Assessment/Plan:      The patient's current anticoagulation dose is Coumadin 5 mg tabs: Use as directed by anticoagulation clinic..  The target INR is 3.0 - 3.5.  The next INR is due 08/15/2010.  Anticoagulation instructions were given to patient.  Results were reviewed/authorized by Cloyde Reams, RN, BSN.  She was notified by Cloyde Reams RN.         Prior Anticoagulation Instructions: INR 3.1 Continue 5mg s everyday except 7.5mg s on Thursdays.  Recheck in 4 weeks.   Current Anticoagulation Instructions: INR 4.6  Skip x 1 dose then resume same dosage 1 tablet daily except 1.5 tablets on Thursdays.  Recheck in 3 weeks.

## 2010-07-31 NOTE — Letter (Signed)
Summary: Work Excuse  Coumadin  [address]   [place], [place] [address]   Phone: [phone]  Fax: [phone]     July 25, 2010    [facility] [place]   The above named patient had a medical visit today 07/25/10.  Please take this into consideration when reviewing the time away from work.      Sincerely yours,  Architectural technologist

## 2010-08-15 ENCOUNTER — Encounter: Payer: Self-pay | Admitting: Internal Medicine

## 2010-08-15 ENCOUNTER — Encounter (INDEPENDENT_AMBULATORY_CARE_PROVIDER_SITE_OTHER): Payer: BC Managed Care – PPO

## 2010-08-15 DIAGNOSIS — Z9889 Other specified postprocedural states: Secondary | ICD-10-CM

## 2010-08-15 DIAGNOSIS — I059 Rheumatic mitral valve disease, unspecified: Secondary | ICD-10-CM

## 2010-08-15 DIAGNOSIS — I4891 Unspecified atrial fibrillation: Secondary | ICD-10-CM

## 2010-08-15 DIAGNOSIS — Z7901 Long term (current) use of anticoagulants: Secondary | ICD-10-CM

## 2010-08-19 ENCOUNTER — Emergency Department (HOSPITAL_COMMUNITY): Payer: BC Managed Care – PPO

## 2010-08-19 ENCOUNTER — Emergency Department (HOSPITAL_COMMUNITY)
Admission: EM | Admit: 2010-08-19 | Discharge: 2010-08-19 | Disposition: A | Discharge status: Home / Self Care | Payer: BC Managed Care – PPO | Attending: Emergency Medicine | Admitting: Emergency Medicine

## 2010-08-19 DIAGNOSIS — R10816 Epigastric abdominal tenderness: Secondary | ICD-10-CM | POA: Insufficient documentation

## 2010-08-19 DIAGNOSIS — I10 Essential (primary) hypertension: Secondary | ICD-10-CM | POA: Insufficient documentation

## 2010-08-19 DIAGNOSIS — E7800 Pure hypercholesterolemia, unspecified: Secondary | ICD-10-CM | POA: Insufficient documentation

## 2010-08-19 DIAGNOSIS — R61 Generalized hyperhidrosis: Secondary | ICD-10-CM | POA: Insufficient documentation

## 2010-08-19 DIAGNOSIS — I4891 Unspecified atrial fibrillation: Secondary | ICD-10-CM | POA: Insufficient documentation

## 2010-08-19 DIAGNOSIS — Z9889 Other specified postprocedural states: Secondary | ICD-10-CM | POA: Insufficient documentation

## 2010-08-19 DIAGNOSIS — M25519 Pain in unspecified shoulder: Secondary | ICD-10-CM | POA: Insufficient documentation

## 2010-08-19 DIAGNOSIS — R1013 Epigastric pain: Secondary | ICD-10-CM | POA: Insufficient documentation

## 2010-08-19 DIAGNOSIS — R110 Nausea: Secondary | ICD-10-CM | POA: Insufficient documentation

## 2010-08-19 DIAGNOSIS — N390 Urinary tract infection, site not specified: Secondary | ICD-10-CM | POA: Insufficient documentation

## 2010-08-19 LAB — COMPREHENSIVE METABOLIC PANEL
ALT: 29 U/L (ref 0–35)
Albumin: 3.4 g/dL — ABNORMAL LOW (ref 3.5–5.2)
Calcium: 9 mg/dL (ref 8.4–10.5)
Glucose, Bld: 155 mg/dL — ABNORMAL HIGH (ref 70–99)
Potassium: 3.8 mEq/L (ref 3.5–5.1)
Sodium: 137 mEq/L (ref 135–145)
Total Protein: 7.4 g/dL (ref 6.0–8.3)

## 2010-08-19 LAB — URINE MICROSCOPIC-ADD ON

## 2010-08-19 LAB — DIFFERENTIAL
Lymphocytes Relative: 19 % (ref 12–46)
Lymphs Abs: 1.3 10*3/uL (ref 0.7–4.0)
Neutro Abs: 5 10*3/uL (ref 1.7–7.7)
Neutrophils Relative %: 74 % (ref 43–77)

## 2010-08-19 LAB — CBC
HCT: 38.4 % (ref 36.0–46.0)
MCV: 86.1 fL (ref 78.0–100.0)
Platelets: 145 10*3/uL — ABNORMAL LOW (ref 150–400)
RBC: 4.46 MIL/uL (ref 3.87–5.11)
WBC: 6.8 10*3/uL (ref 4.0–10.5)

## 2010-08-19 LAB — LIPASE, BLOOD: Lipase: 38 U/L (ref 11–59)

## 2010-08-19 LAB — URINALYSIS, ROUTINE W REFLEX MICROSCOPIC
Bilirubin Urine: NEGATIVE
Ketones, ur: NEGATIVE mg/dL
Urine Glucose, Fasting: NEGATIVE mg/dL
pH: 5 (ref 5.0–8.0)

## 2010-08-19 LAB — PROTIME-INR
INR: 2.94 — ABNORMAL HIGH (ref 0.00–1.49)
Prothrombin Time: 30.7 seconds — ABNORMAL HIGH (ref 11.6–15.2)

## 2010-08-19 LAB — DIGOXIN LEVEL: Digoxin Level: 1.7 ng/mL (ref 0.8–2.0)

## 2010-08-20 NOTE — Medication Information (Signed)
Summary: rov/ewj  Anticoagulant Therapy  Managed by: Weston Brass, PharmD Referring MD: Valera Castle MD PCP: Loma Sender Supervising MD: Ladona Ridgel MD, Sharlot Gowda Indication 1: Mitral Valve Replacement (ICD-V43.3) Indication 2: Mitral Valve Disorder (ICD-424.0) Lab Used: [facility] [place] Site: [address] INR POC 4.4 INR RANGE 3 - 3.5  Dietary changes: no    Health status changes: no    Bleeding/hemorrhagic complications: no    Recent/future hospitalizations: no    Any changes in medication regimen? no    Recent/future dental: no  Any missed doses?: no       Is patient compliant with meds? yes       Allergies: No Known Drug Allergies  Anticoagulation Management History:      The patient is taking warfarin and comes in today for a routine follow up visit.  Negative risk factors for bleeding include an age less than 90 years old.  The bleeding index is 'low risk'.  Positive CHADS2 values include History of HTN.  Negative CHADS2 values include Age > 18 years old.  The start date was 10/10/1997.  Her last INR was 3.5.  Anticoagulation responsible provider: Ladona Ridgel MD, Sharlot Gowda.  INR POC: 4.4.  Cuvette Lot#: [id number].  Exp: 07/2011.    Anticoagulation Management Assessment/Plan:      The patient's current anticoagulation dose is Coumadin 5 mg tabs: Use as directed by anticoagulation clinic..  The target INR is 3.0 - 3.5.  The next INR is due 08/29/2010.  Anticoagulation instructions were given to patient.  Results were reviewed/authorized by Weston Brass, PharmD.  She was notified by Margot Chimes PharmD Candidate.         Prior Anticoagulation Instructions: INR 4.6  Skip x 1 dose then resume same dosage 1 tablet daily except 1.5 tablets on Thursdays.  Recheck in 3 weeks.   Current Anticoagulation Instructions: INR 4.4  Skip todays dose.  Then start taking 1 tablet daily.  Recheck INR in 2 weeks.

## 2010-08-29 ENCOUNTER — Encounter (INDEPENDENT_AMBULATORY_CARE_PROVIDER_SITE_OTHER): Payer: BC Managed Care – PPO

## 2010-08-29 ENCOUNTER — Encounter: Payer: Self-pay | Admitting: Internal Medicine

## 2010-08-29 DIAGNOSIS — Z954 Presence of other heart-valve replacement: Secondary | ICD-10-CM

## 2010-08-29 DIAGNOSIS — Z7901 Long term (current) use of anticoagulants: Secondary | ICD-10-CM

## 2010-08-29 DIAGNOSIS — I059 Rheumatic mitral valve disease, unspecified: Secondary | ICD-10-CM

## 2010-09-03 LAB — BASIC METABOLIC PANEL
CO2: 27 mEq/L (ref 19–32)
Calcium: 9 mg/dL (ref 8.4–10.5)
GFR calc Af Amer: >60 mL/min (ref >60)
GFR calc non Af Amer: >60 mL/min (ref >60)
Potassium: 4.5 mEq/L (ref 3.5–5.1)
Sodium: 136 mEq/L (ref 135–145)

## 2010-09-03 LAB — CBC
HCT: 39.6 % (ref 36.0–46.0)
Hemoglobin: 12.9 g/dL (ref 12.0–15.0)
RBC: 4.52 MIL/uL (ref 3.87–5.11)

## 2010-09-03 LAB — PROTIME-INR
INR: 2.24 — ABNORMAL HIGH (ref 0.00–1.49)
Prothrombin Time: 24.9 seconds — ABNORMAL HIGH (ref 11.6–15.2)

## 2010-09-03 NOTE — Medication Information (Signed)
Summary: rov/tm  Anticoagulant Therapy  Managed by: Weston Brass, PharmD Referring MD: Valera Castle MD PCP: Loma Sender Supervising MD: Ladona Ridgel MD, Sharlot Gowda Indication 1: Mitral Valve Replacement (ICD-V43.3) Indication 2: Mitral Valve Disorder (ICD-424.0) Lab Used: [facility] [place] Site: [address] INR POC 2.9 INR RANGE 3 - 3.5  Dietary changes: no    Health status changes: no    Bleeding/hemorrhagic complications: no    Recent/future hospitalizations: no    Any changes in medication regimen? yes       Details: finished Nitrofurantion on Monday  Recent/future dental: no  Any missed doses?: no       Is patient compliant with meds? yes       Allergies: No Known Drug Allergies  Anticoagulation Management History:      The patient is taking warfarin and comes in today for a routine follow up visit.  Negative risk factors for bleeding include an age less than 84 years old.  The bleeding index is 'low risk'.  Positive CHADS2 values include History of HTN.  Negative CHADS2 values include Age > 82 years old.  The start date was 10/10/1997.  Her last INR was 3.5.  Anticoagulation responsible provider: Ladona Ridgel MD, Sharlot Gowda.  INR POC: 2.9.  Cuvette Lot#: [id number].  Exp: 08/2011.    Anticoagulation Management Assessment/Plan:      The patient's current anticoagulation dose is Coumadin 5 mg tabs: Use as directed by anticoagulation clinic..  The target INR is 3.0 - 3.5.  The next INR is due 09/19/2010.  Anticoagulation instructions were given to patient.  Results were reviewed/authorized by Weston Brass, PharmD.  She was notified by Weston Brass PharmD.         Prior Anticoagulation Instructions: INR 4.4  Skip todays dose.  Then start taking 1 tablet daily.  Recheck INR in 2 weeks.    Current Anticoagulation Instructions: INR 2.9  Take 1 1/2 tablets today then resume same dose of 1 tablet every day.  Recheck INR in 3 weeks.

## 2010-09-12 ENCOUNTER — Other Ambulatory Visit: Payer: Self-pay | Admitting: Cardiology

## 2010-09-15 LAB — POCT CARDIAC MARKERS
CKMB, poc: 1.5 ng/mL (ref 1.0–8.0)
CKMB, poc: <1 ng/mL — ABNORMAL LOW (ref 1.0–8.0)
Myoglobin, poc: 60.3 ng/mL (ref 12–200)
Myoglobin, poc: 65.3 ng/mL (ref 12–200)
Troponin i, poc: <0.05 ng/mL (ref 0.00–0.09)
Troponin i, poc: <0.05 ng/mL (ref 0.00–0.09)

## 2010-09-15 LAB — DIFFERENTIAL
Basophils Absolute: 0 10*3/uL (ref 0.0–0.1)
Eosinophils Relative: 1 % (ref 0–5)
Lymphocytes Relative: 15 % (ref 12–46)

## 2010-09-15 LAB — COMPREHENSIVE METABOLIC PANEL
AST: 24 U/L (ref 0–37)
BUN: 8 mg/dL (ref 6–23)
CO2: 28 mEq/L (ref 19–32)
Chloride: 101 mEq/L (ref 96–112)
Creatinine, Ser: 0.76 mg/dL (ref 0.4–1.2)
GFR calc Af Amer: >60 mL/min (ref >60)
GFR calc non Af Amer: >60 mL/min (ref >60)
Glucose, Bld: 85 mg/dL (ref 70–99)
Total Bilirubin: 0.9 mg/dL (ref 0.3–1.2)

## 2010-09-15 LAB — APTT: aPTT: 37 seconds (ref 24–37)

## 2010-09-15 LAB — CBC
HCT: 36.5 % (ref 36.0–46.0)
Hemoglobin: 12.3 g/dL (ref 12.0–15.0)
MCV: 87.2 fL (ref 78.0–100.0)
RBC: 4.18 MIL/uL (ref 3.87–5.11)
WBC: 8.1 10*3/uL (ref 4.0–10.5)

## 2010-09-15 LAB — LIPASE, BLOOD: Lipase: 33 U/L (ref 11–59)

## 2010-09-18 ENCOUNTER — Other Ambulatory Visit: Payer: Self-pay

## 2010-09-18 ENCOUNTER — Ambulatory Visit (HOSPITAL_COMMUNITY): Payer: BC Managed Care – PPO

## 2010-09-18 ENCOUNTER — Other Ambulatory Visit (HOSPITAL_COMMUNITY): Payer: Self-pay | Admitting: Pulmonary Disease

## 2010-09-18 DIAGNOSIS — M79606 Pain in leg, unspecified: Secondary | ICD-10-CM

## 2010-09-18 MED ORDER — WARFARIN SODIUM 5 MG PO TABS: 5.0000 mg | ORAL_TABLET | ORAL | Status: DC

## 2010-09-19 ENCOUNTER — Ambulatory Visit (INDEPENDENT_AMBULATORY_CARE_PROVIDER_SITE_OTHER): Payer: BC Managed Care – PPO | Admitting: *Deleted

## 2010-09-19 DIAGNOSIS — I059 Rheumatic mitral valve disease, unspecified: Secondary | ICD-10-CM

## 2010-09-19 DIAGNOSIS — Z9889 Other specified postprocedural states: Secondary | ICD-10-CM

## 2010-09-19 DIAGNOSIS — I4891 Unspecified atrial fibrillation: Secondary | ICD-10-CM

## 2010-09-19 DIAGNOSIS — Z7901 Long term (current) use of anticoagulants: Secondary | ICD-10-CM

## 2010-09-19 NOTE — Patient Instructions (Signed)
INR 4.7 Skip today's dose and have an extra serving of dark green leafy vegetables  (try to have about 2 servings a week.)  Then, resume taking 1 tablet (5mg ) daily. Recheck in 10 days.

## 2010-09-19 NOTE — Telephone Encounter (Signed)
[address]

## 2010-10-01 ENCOUNTER — Ambulatory Visit (INDEPENDENT_AMBULATORY_CARE_PROVIDER_SITE_OTHER): Payer: BC Managed Care – PPO | Admitting: *Deleted

## 2010-10-01 DIAGNOSIS — Z7901 Long term (current) use of anticoagulants: Secondary | ICD-10-CM

## 2010-10-01 DIAGNOSIS — I4891 Unspecified atrial fibrillation: Secondary | ICD-10-CM

## 2010-10-01 DIAGNOSIS — I059 Rheumatic mitral valve disease, unspecified: Secondary | ICD-10-CM

## 2010-10-01 DIAGNOSIS — Z9889 Other specified postprocedural states: Secondary | ICD-10-CM

## 2010-10-02 LAB — POCT I-STAT, CHEM 8
BUN: 6 mg/dL (ref 6–23)
Chloride: 105 mEq/L (ref 96–112)
Creatinine, Ser: 0.9 mg/dL (ref 0.4–1.2)
Glucose, Bld: 112 mg/dL — ABNORMAL HIGH (ref 70–99)
Potassium: 3.8 mEq/L (ref 3.5–5.1)

## 2010-10-02 LAB — DIFFERENTIAL
Eosinophils Absolute: 0.1 10*3/uL (ref 0.0–0.7)
Eosinophils Relative: 1 % (ref 0–5)
Lymphs Abs: 1.1 10*3/uL (ref 0.7–4.0)
Monocytes Absolute: 0.3 10*3/uL (ref 0.1–1.0)
Monocytes Relative: 7 % (ref 3–12)

## 2010-10-02 LAB — CBC
HCT: 37.2 % (ref 36.0–46.0)
MCV: 86.8 fL (ref 78.0–100.0)
Platelets: 176 10*3/uL (ref 150–400)
RBC: 4.28 MIL/uL (ref 3.87–5.11)
WBC: 4.8 10*3/uL (ref 4.0–10.5)

## 2010-10-21 ENCOUNTER — Ambulatory Visit (INDEPENDENT_AMBULATORY_CARE_PROVIDER_SITE_OTHER): Payer: BC Managed Care – PPO | Admitting: *Deleted

## 2010-10-21 DIAGNOSIS — Z9889 Other specified postprocedural states: Secondary | ICD-10-CM

## 2010-10-21 DIAGNOSIS — I4891 Unspecified atrial fibrillation: Secondary | ICD-10-CM

## 2010-10-21 DIAGNOSIS — I059 Rheumatic mitral valve disease, unspecified: Secondary | ICD-10-CM

## 2010-10-31 ENCOUNTER — Encounter: Payer: Self-pay | Admitting: Cardiology

## 2010-11-02 ENCOUNTER — Other Ambulatory Visit: Payer: Self-pay | Admitting: Cardiology

## 2010-11-04 ENCOUNTER — Other Ambulatory Visit: Payer: Self-pay | Admitting: Cardiology

## 2010-11-04 ENCOUNTER — Encounter: Payer: Self-pay | Admitting: Cardiology

## 2010-11-04 ENCOUNTER — Encounter: Payer: Self-pay | Admitting: *Deleted

## 2010-11-04 ENCOUNTER — Ambulatory Visit (INDEPENDENT_AMBULATORY_CARE_PROVIDER_SITE_OTHER): Payer: BC Managed Care – PPO | Admitting: Cardiology

## 2010-11-04 DIAGNOSIS — I10 Essential (primary) hypertension: Secondary | ICD-10-CM

## 2010-11-04 DIAGNOSIS — E785 Hyperlipidemia, unspecified: Secondary | ICD-10-CM

## 2010-11-04 DIAGNOSIS — I4891 Unspecified atrial fibrillation: Secondary | ICD-10-CM

## 2010-11-04 DIAGNOSIS — E7800 Pure hypercholesterolemia, unspecified: Secondary | ICD-10-CM

## 2010-11-04 DIAGNOSIS — Z9889 Other specified postprocedural states: Secondary | ICD-10-CM

## 2010-11-04 LAB — LIPID PANEL
Cholesterol: 246 mg/dL — ABNORMAL HIGH (ref 0–200)
Total CHOL/HDL Ratio: 6
Triglycerides: 119 mg/dL (ref 0.0–149.0)

## 2010-11-04 LAB — HEPATIC FUNCTION PANEL
ALT: 31 U/L (ref 0–35)
AST: 39 U/L — ABNORMAL HIGH (ref 0–37)
Alkaline Phosphatase: 70 U/L (ref 39–117)
Total Bilirubin: 0.9 mg/dL (ref 0.3–1.2)

## 2010-11-04 NOTE — Assessment & Plan Note (Signed)
Stable, no change in treatment.

## 2010-11-04 NOTE — Assessment & Plan Note (Signed)
Stable

## 2010-11-04 NOTE — Assessment & Plan Note (Signed)
Check lipids today 

## 2010-11-04 NOTE — Progress Notes (Signed)
   Patient ID: [name], female    DOB: 1955/06/25, 55 y.o.   MRN: [id number]  HPI [name] returns for pre op clearance for surveillance colonoscopy. She feels great, is walking daily and has kept her weight down. She has fasted today for lipids. Last Echo 06/29/09 showed EF of 55-60%, stable MVR , moderate LAE,mild AI, mild LAE, mild RAE and moderate TR.  EKG today shows AFIB with well controlled rate, right axis, no change.    Review of Systems  All other systems reviewed and are negative.      Physical Exam  Nursing note and vitals reviewed. Constitutional: She is oriented to person, place, and time. She appears well-developed and well-nourished. No distress.  HENT:  Head: Normocephalic and atraumatic.  Eyes: EOM are normal. Pupils are equal, round, and reactive to light.  Neck: Neck supple. No JVD present. No tracheal deviation present. No thyromegaly present.  Cardiovascular: Normal rate, S2 normal and normal pulses.  An irregularly irregular rhythm present.  No extrasystoles are present. PMI is not displaced.        Prosthetic S1  Pulmonary/Chest: Effort normal and breath sounds normal.  Abdominal: Soft. Bowel sounds are normal.  Musculoskeletal: Normal range of motion. She exhibits no edema.  Neurological: She is alert and oriented to person, place, and time.  Skin: Skin is warm and dry.  Psychiatric: She has a normal mood and affect.

## 2010-11-04 NOTE — Assessment & Plan Note (Signed)
improved

## 2010-11-04 NOTE — Patient Instructions (Signed)
Your physician recommends that you schedule a follow-up appointment in:  6 months with Dr. Daleen Squibb Your physician recommends that you have lab work today fasting cholesterol.  We will call you with results.

## 2010-11-05 ENCOUNTER — Telehealth: Payer: Self-pay | Admitting: *Deleted

## 2010-11-05 DIAGNOSIS — E785 Hyperlipidemia, unspecified: Secondary | ICD-10-CM

## 2010-11-05 MED ORDER — ATORVASTATIN CALCIUM 40 MG PO TABS: 40.0000 mg | ORAL_TABLET | Freq: Every day | ORAL | Status: DC
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
## 2010-11-05 NOTE — Assessment & Plan Note (Signed)
[facility] HEALTHCARE                            CARDIOLOGY OFFICE NOTE   NAME:Schiffman, EVERLYN FARABAUGH                     MRN:          [id number]  DATE:10/18/2007                            DOB:          03/28/1956    Ms. Tooley returns today for close followup of the following issues.  1. History of valvular heart disease status post St. Jude mitral valve      replacement.  2. Chronic atrial fibrillation with rate control with anticoagulation.  3. Anticoagulation.   Please see my previous note from September 17, 2007.   She is doing better and is back at work.  She had clearance from GI on a  recent visit to [facility].  She had no recurrent abdominal pain.   Her INR today is 3.2.   PROBLEM LIST:  As previously outlined.   Her current meds are metoprolol extended release 50 mg a day,  simvastatin 40 mg nightly, Coumadin and digoxin 0.25 mg p.o. daily.   She denies orthopnea, PND, chest pain, palpitations.   EXAM:  Today her blood pressure is 121/84, pulse 70 and irregular.  Her  weight is 169.  HEENT:  Unchanged.  Neck is remarkable for no JVD.  Carotids upstrokes were equal bilaterally without bruits.  Thyroid is  not enlarged.  LUNGS:  Clear.  HEART:  Reveals a prosthetic S1.  Irregular rate and rhythm is well  controlled.  ABDOMEN:  Soft, good bowel sounds.  EXTREMITIES:  There is no edema.  Pulses are intact.  NEURO:  Exam is intact.  SKIN:  Unremarkable.   Electrocardiogram today shows atrial fibrillation with nonspecific ST-  segment changes.  There has been no significant change from before.   ASSESSMENT/PLAN:  Ms. Mcneely is doing well.  She is finally over her  cholangitis and the extended illness she had back in the winter.  She  looks remarkably better.  Her rates are well controlled with metoprolol  and digoxin.   I have made no change in her program.  I will plan on seeing her back in  6 months.     Thomas C. Daleen Squibb, MD, [facility]  Electronically Signed    TCW/MedQ  DD: 10/18/2007  DT: 10/18/2007  Job #: [id number]

## 2010-11-05 NOTE — Telephone Encounter (Signed)
Pt aware of results.  She is taking her zocor daily.  She will start atorvastatin per Dr. Daleen Squibb today and return for lab work in 6 weeks. Mylo Red RN

## 2010-11-05 NOTE — Assessment & Plan Note (Signed)
[facility] OFFICE NOTE   NAME:Peggs, [name]                     MRN:          [id number]  DATE:01/06/2007                            DOB:          [birth year]    Ms. Knoche returns today after being discharged from the hospital for  chest pain and diaphoresis.   Her cardiac markers were negative.  She underwent cardiac  catheterization which showed normal coronary arteries.   Because she has a St. Jude valve in her mitral position for mitral  stenosis, she was overlapped with enoxaparin and Coumadin.   She has had a little bit of a bleed or ooze from her right calf site.  She has no hematoma.  There is no bruit there.  She has good peripheral  pulses.  Her last INR was 2.2.   She has had one other episode of some chest tightness and sweatiness.  She has a history of gastroesophageal reflux and thinks it might be  that.  She would like a PPI.   Her last lipids show that she was not taking her Vytorin.  She has had a  significant financial issue in the past with her medications.  For that  reason we will change her to simvastatin 40 mg p.o. daily; recheck  lipids in six weeks or so.   She looks good today.  Her blood pressure was 140/70, heart rate 70 and  irregular.  EKG is unchanged and shows atrial fibrillation.  Her groin  site was as stated.  She has a prosthetic S1, clear lungs.   PLAN:  1. Change Vytorin to Simvastatin 40 mg q.h.s.  Follow up lipids and      LFT's in six weeks.  2. Manual pressure to her groin when it oozes.  If she begins to have      significant oozing or develops a knot or hematoma as I have      described to her today, she will let us know.  3. Generic PPI for one month and p.r.n.   Will see her back again in six months.     Thomas C. Daleen Squibb, MD, [facility]  Electronically Signed    TCW/MedQ  DD: 01/06/2007  DT: 01/07/2007  Job #: [id number]   cc:   Loma Sender

## 2010-11-05 NOTE — H&P (Signed)
[name], NEPHEW              ACCOUNT NO.:  [id number]   MEDICAL RECORD NO.:  [id number]          PATIENT TYPE:  EMS   LOCATION:  ED                           FACILITY:  [facility]   PHYSICIAN:  Madolyn Frieze. Jens Som, MD, FACCDATE OF BIRTH:  [birth year]   DATE OF ADMISSION:  12/24/2006  DATE OF DISCHARGE:                              HISTORY & PHYSICAL   PRIMARY CARDIOLOGIST:  Maisie Fus C. Daleen Squibb, MD, [facility] Card[facility].   CHIEF COMPLAINT:  Chest pain.   HISTORY OF PRESENT ILLNESS:  This is a 55 year old woman status post  MVR, atrial fibrillation, hypertension, and hyperlipidemia, who had the  sudden onset of chest pain at around 8:00 p.m. this evening while  walking to her car after visiting a friend's house.  She describes the  pain as a 10/10, radiating to her left arm, starting in the epigastrium  and being a sharp pressure.  She denies any nausea or vomiting or  shortness of breath.  She does express some diaphoresis.  She said she  started in the emergency room, and on the way, the pain started to  resolve; however, once she came into the emergency room, again it became  worse.  She got some nitroglycerin and other medications in the  emergency room.  She says that finally it resolved her symptoms.  She  does not recall exactly which medicine completely resolved her symptoms.  She is currently asymptomatic.  She denies a history of similar symptoms  during this episode.  She denies any lightheadedness or dizziness.  She  denies any lower extremity edema, orthopnea, PND, or exertional chest  pain at baseline.   REVIEW OF SYSTEMS:  As above.  She additionally notes some occasional  palpitations.  She denies any fevers, chills, diarrhea, or dysuria.  She  also denies any cough at this point in time.  The remaining 18-point  review of systems is negative.   PAST MEDICAL HISTORY:  1. Atrial fibrillation on Coumadin.  2. History of severe mitral stenosis, status post MVR in  1997      secondary to rheumatic heart disease.  She had a cardiac      catheterization at that time, and it was reportedly normal.  3. Hypertension.  4. Hyperlipidemia.  5. GERD.  6. History of hyperlipidemia, requiring a course of PTU in the past.  7. Moderate pulmonary hypertension.  8. Status post cholecystectomy.  9. Status post cervical laminectomy.   ALLERGIES:  None.   MEDICATIONS:  1. She is on Coumadin, dosed by the anticoagulation clinic.  2. Vytorin 10/40 mg daily.  3. Digoxin 0.25 mg daily.  4. Hydrochlorothiazide 12.5 mg daily.  5. Multivitamin daily.   SOCIAL HISTORY:  She lives in [place] alone.  She is a Runner, broadcasting/film/video.  She  denies any alcohol or drug use.   Her mother is still alive but does have atrial fibrillation.  Her father  is still alive with hypertension.  She has two siblings who are both in  good health.   PHYSICAL EXAMINATION:  VITAL SIGNS:  Temperature 97.3, pulse 80,  respiratory rate  18, blood pressure 110/75.  Weight is 102 kg.  Her O2  saturation is 96% on room air.  GENERAL:  She is alert and oriented x3 in no acute distress.  HEENT:  Normocephalic and atraumatic.  Pupils are equal, round and  reactive to light.  Extraocular movements are intact.  Anicteric  sclerae.  NECK:  Supple with no lymphadenopathy.  JVP is flat.  She has no carotid  bruits.  CARDIOVASCULAR:  Regular rate and rhythm with an occasional ectopy.  She  has a mechanical S1 with a soft 1/6 systolic ejection murmur related to  that.  She has no other murmurs or gallops.  LUNGS:  Clear to auscultation bilaterally without any wheezes, rales or  rhonchi.  SKIN:  Without any rashes or lesions.  ABDOMEN:  Positive bowel sounds.  Soft, nontender, nondistended without  any palpable masses, without any hepatosplenomegaly.  EXTREMITIES:  Radial and posterior tibialis pulses 2+ are equal  bilaterally without any lower extremity clubbing, cyanosis or edema.  NEUROLOGIC:  Alert and  oriented x3.  Cranial nerves III-XII are grossly  intact.  She has 5/5 motor strength throughout.   Chest x-ray is negative for any edema or fusion.   EKG shows atrial fibrillation at a rate of 74 with normal axis with  normal intervals without any Q waves with some dynamic ST depression  from serial EKGs in the emergency room at [facility].   White count is 14.5 with a normal differential.  Hematocrit 37.4,  platelets 284.  Potassium 3.2, sodium 133, creatinine 1.  LFTs are  within normal limits. The D-dimer is negative.  She had two sets of  cardiac markers at [facility].  An INR is 2.  Digoxin is 1.  A lipase  was 29.   ASSESSMENT:  1. Unstable angina versus a gastrointestinal process; however, there      were some concerning dynamic EKG changes in the emergency      department that are suggestive that this could be cardiac in      etiology.  2. Atrial fibrillation, on Coumadin.  3. Status post mitral valve replacement, currently on Coumadin.  4. Hypertension.  5. Hyperlipidemia.   PLAN:  Patient will be admitted to a telemetry bed.  She will be ruled  out by cardiac enzymes x3.  I have stopped her Coumadin,  hydrochlorothiazide, digoxin, to start some evidence-based therapies,  including Lovenox, an ACE inhibitor, beta blocker.  Additionally, I have  started her on a PPI.  I am unclear why she has an elevated blood count.  She does not have any localizing signs of infection.  She has been  afebrile.  We will send a UA.  We will place her potassium.  I will have  the team see her in the morning and make a decision after the remainder  of her lab work returns in regards to further risk stratification with  stress test versus cardiac catheterization.     ______________________________  [name] Sherryll Burger, MD      Madolyn Frieze. Jens Som, MD, [facility]  Electronically Signed    BRS/MEDQ  D:  12/25/2006  T:  12/25/2006  Job:  [id number]

## 2010-11-05 NOTE — Consult Note (Signed)
NAMEJEMINA, SCAHILL              ACCOUNT NO.:  [id number]   MEDICAL RECORD NO.:  [id number]          PATIENT TYPE:  INP   LOCATION:  4702                         FACILITY:  [facility]   PHYSICIAN:  Jesse Sans. Wall, MD, FACCDATE OF BIRTH:  08-22-1955   DATE OF CONSULTATION:  09/09/2007  DATE OF DISCHARGE:                                 CONSULTATION   I was asked Bernette Redbird, M.D., to consult and handle any cardiac  issues on [name].  Please see all the written evaluations in E-  chart as well as my last cardiology office note September 03, 2007.  In  summary, she is a 55 year old white female who was discharged from [facility] on August 29, 2007, after having a very  complicated hospital course with biliary sepsis.  She had positive blood  cultures for Klebsiella.  She responded to antibiotics, fluid  resuscitation, ventilatory support and had an ERCP with stent placement.   She had acute renal failure that is now resolved.  She also had severe  anemia requiring two blood transfusions prior to discharge.   Her cardiac issues are significant for rheumatic heart disease with a  history of severe mitral stenosis.  She is status post St. Jude mitral  valve replacement and has chronic atrial fibrillation.  She has a very  large left atrium.  In the hospital, she became severely bradycardic,  necessitating stopping her digoxin and her metoprolol.   I also follow her for hypertension and hyperlipidemia.  She is on  chronic anticoagulation.   Today, she underwent an ERCP which showed no evidence of obstruction.  The stent was removed.  At the end of the procedure, the duct looked  cleared.  Hopefully this is just incipient cholangitis and will respond  to IV antibiotics.   CURRENT PHYSICAL EXAMINATION:  VITAL SIGNS:  Blood pressure 125/65, her  heart rate 75 and regular, respiratory rate 18, temperature is 98.1, O2  sats 95% on room air.  GENERAL APPEARANCE:   She is in no acute distress.  Alert and oriented.  Skin is warm and dry.  HEENT:  Unchanged.  NECK:  Carotid upstrokes are equal bilaterally.  Thyroid is not  enlarged.  Trachea is midline.  LUNGS:  Clear.  HEART:  A prosthetic S1.  No diastolic murmur.  No gallop.  No right  ventricular lift.  ABDOMEN:  Soft, but decreased bowel sounds.  EXTREMITIES:  No cyanosis, clubbing or edema.  Pulses are brisk.   Her INR is 1.5 after receiving fresh frozen plasma.  Her creatinine is  13, BUN is 1.3.  In the outpatient setting this week, she had a  potassium 5.7 which is now 4.6.  Her amylase is normal.  Her lipase is  normal.   Looking back through her chart, it appears she did not require fresh  frozen as Dr. Matthias Hughs and I discussed.  She has been placed on Lovenox  subcu per pharmacy.  She is currently on 90 mg subcu q.12h.  We will  resume Coumadin per pharmacy.   I have made no  changes in her program tonight.  We will see her on a  daily basis.  Thank you for the notification.      Thomas C. Daleen Squibb, MD, [facility]  Electronically Signed     TCW/MEDQ  D:  09/09/2007  T:  09/10/2007  Job:  [id number]

## 2010-11-05 NOTE — Telephone Encounter (Signed)
Message copied by Lisabeth Devoid on Tue Nov 05, 2010 11:08 AM ------      Message from: Valera Castle      Created: Mon Nov 04, 2010  4:37 PM       Restart simvastatin. IF taking  Switch to atorvastatin 40mg .

## 2010-11-05 NOTE — Op Note (Signed)
NAMEYURIANA, GAAL              ACCOUNT NO.:  [id number]   MEDICAL RECORD NO.:  [id number]          PATIENT TYPE:  AMB   LOCATION:  DSC                          FACILITY:  [facility]   PHYSICIAN:  Harvie Junior, M.D.   DATE OF BIRTH:  Oct 10, 1955   DATE OF PROCEDURE:  01/20/2007  DATE OF DISCHARGE:                               OPERATIVE REPORT   PREOPERATIVE DIAGNOSES:  1. Carpal tunnel syndrome.  2. Mass dorsal aspect of hand.   POSTOPERATIVE DIAGNOSES:  1. Carpal tunnel syndrome.  2. Mass dorsal aspect of hand.   PRINCIPAL PROCEDURES:  1. Carpal tunnel release.  2. Excision of dorsal mass of the surgery.   SURGEON:  Harvie Junior, M.D.   ASSISTANT:  None.   ANESTHESIA:  General.   BRIEF HISTORY:  Mrs. Duling is a 55 year old female with a long history  of having a significant hand pain, thumb and long finger numbness with  some weakness of grip.  We had treated her conservatively for a long  period time.  An EMG was obtained which showed that she had carpal  tunnel syndrome.  We tried injection therapy, which failed.  We  ultimately felt that a carpal tunnel release was the appropriate course  of action.  She had a strange mass on the back of her hand which was  mobile underneath the skin, and we talked about removing this as well,  given that it was bothering her, so she was brought to the operating  room for these procedures.   PROCEDURE:  The patient was brought to the operating room.  After  adequate anesthesia was obtained with a general anesthetic, the patient  was placed on the operating table.  The right arm was prepped and draped  in the usual sterile fashion.  Following this, the arm was prepped and  draped in the usual sterile fashion.  The arm was exsanguinated.  Blood  pressure tourniquet was inflated to 250 mmHg.  Following this a small  incision was made over the dorsal aspect of the hand and the location of  where this little mass was.  It was freely  mobile before surgery, and we  really had a tough time finding it initially, and then ultimately it did  come into the wound, and it was excised.  It was put into a cup and  given to the patient.  At that point, attention was turned to the front  of the hand, where a small incision was made just ulnar to the midline  wrist crease.  Subcutaneous tissues were dissected down to the level of  the volar carpal ligament which was clearly identified and divided into  the carpal canal.  A Freer elevator was used to make sure the nerve was  not adherent.  The carpal tunnel was then opened in the standard  fashion, and a gloved finger could be placed in the wound proximally and  distally.  The wound was copiously and thoroughly irrigated.  At this  point, we did let the tourniquet down, as the patient did have an INR  which  was 2.1 prior to surgery.  We had had a long talk with her prior  to the surgery about the risk of bleeding at or after surgery.  We felt  that this was commensurate with the surgery that was being done, given  that she had an artificial valve and that trying to get her less  anticoagulated may be dangerous.  We debated about doing the surgery or  not.  She ultimately elected to have it done, and it was my opinion that  this was appropriate, given the overall risks involved. At any rate, we  let the tourniquet down because of the high INR.  We did control  bleeding with electrocautery, other it was really not much.  After  copious irrigation, the carpal tunnel incision was closed with  interrupted and running 4-0 nylon suture, and then the dorsal incision  was closed with some interrupted nylon suture.  Sterile compressive  dressing was applied at this point, as well as a volar plaster, and the  patient was taken to the recovery room.  She was noted to be in  satisfactory condition.  Estimated blood loss for the procedure was  none.      Harvie Junior, M.D.  Electronically  Signed     JLG/MEDQ  D:  01/20/2007  T:  01/21/2007  Job:  [id number]

## 2010-11-05 NOTE — Discharge Summary (Signed)
NAMEJIMMY, [name]              ACCOUNT NO.:  [id number]   MEDICAL RECORD NO.:  [id number]          PATIENT TYPE:  AMB   LOCATION:  DSC                          FACILITY:  [facility]   PHYSICIAN:  Bernette Redbird, M.D.   DATE OF BIRTH:  [birth year]   DATE OF ADMISSION:  09/09/2007  DATE OF DISCHARGE:  09/13/2007                               DISCHARGE SUMMARY   FINAL DIAGNOSES:  1. Abdominal pain and elevated liver chemistries.  2. Recent septic cholangitis.  3. Status post mechanical mitral valve replacement.  4. Chronic atrial fibrillation.  5. Chronic anticoagulation.  6. Mild pulmonary hypertension.  7. History of hyperlipidemia.  8. History of hypertension.   CONSULTATIONS:  Thomas C. Daleen Squibb, MD, [facility], cardiology.   PROCEDURES:  Endoscopic retrograde cholangiopancreatography with removal  of clogged biliary stent.   COMPLICATIONS DURING HOSPITALIZATION:  None.   LABORATORY DATA:  Admission white count 12,900, discharge white count  7000.  Admission hemoglobin 12.7, discharge hemoglobin 11.3.  Platelets  223,000.  Admission differential showed 87 polys, occult blood negative.  Admission INR was 1.5 and prior to discharge it was 2.4.  Chemistry  panel on admission showed BUN 17 and creatinine 1.37, but by the time of  discharge, these levels had dropped to 9 and 1.2, respectively.   Liver chemistries on admission showed AST 141, ALT 75 with bilirubin of  1.9.  Prior to discharge, these had improved somewhat to 66, 64, and  1.2, respectively.  Urine culture was basically negative, blood cultures  negative, C. diff was negative.   HOSPITAL COURSE:  The patient was admitted with abdominal pain and  concerns of possible recurrent cholangitis, having just been out of the  hospital 2 weeks at which time a biliary stent was placed and she had  septic cholangitis and septic shock.  Her liver chemistries at the time  of patient's discharge had been normal with respect to AST and ALT,  so  when she presented with leukocytosis and a bump in her liver chemistries  as noted above, there were concerns of impending septic shock even  though she was not clinically toxic.  She was, therefore, cultured and  started on antibiotics even though she was afebrile, and an ERCP was  done on the day of admission, which showed a clogged stent, which was  successfully removed and the doctor swept and found it to be clear so  the stent was not reinserted.   Thereafter, the patient did well with a prompt drop in her white count.  She was seen by cardiology as her symptoms improved and was bridged with  Lovenox while waiting for her Coumadin to take effect.  Her diet was  advanced and her Zosyn was stopped several days prior to admission as  cultures came back negative.  She remained clinically stable and once  her INR had gone out, it was felt appropriate for her to be discharged.   DISPOSITION:  The patient is discharged home on a heart-healthy diet.  She is to follow up with Dr. Daleen Squibb in several days with a check in the  Coumadin  Clinic that same day.   DISCHARGE MEDICATIONS:  Toprol XL 50 mg each morning and Coumadin 5 mg  each evening.  Note that her digoxin is being held for now.   CONDITION ON DISCHARGE:  Improved.           ______________________________  Bernette Redbird, M.D.     RB/MEDQ  D:  12/08/2007  T:  12/09/2007  Job:  [id number]   cc:   Jesse Sans. Daleen Squibb, MD, [facility]  Loma Sender

## 2010-11-05 NOTE — Consult Note (Signed)
[name], [name]              ACCOUNT NO.:  [id number]   MEDICAL RECORD NO.:  [id number]          PATIENT TYPE:  INP   LOCATION:  2902                         FACILITY:  [facility]   PHYSICIAN:  Sandria Bales. Ezzard Standing, M.D.  DATE OF BIRTH:  [birth year]   DATE OF CONSULTATION:  08/18/2007  DATE OF DISCHARGE:                                 CONSULTATION   Why can't date of consultation be placed here?   TIME OF CONSULTATION:  1425 hours.   REQUESTING PHYSICIAN:  Bernette Redbird, M.D.   CONSULTING SURGEON:  Sandria Bales. Ezzard Standing, M.D.   CARDIOLOGIST:  Jesse Sans. Wall, MD, [facility].   REASON FOR CONSULTATION:  Epigastric pain and sepsis.   HISTORY OF PRESENT ILLNESS:  We have been asked by Dr. Matthias Hughs to  evaluate Ms. [name], who is a 55 year old patient whose past  medical history is significant for St. Jude mitral valve replacement and  atrial fibrillation, on Coumadin.  She also has a prior history in 2003  of undergoing a cholecystectomy and had multiple ERCPs for stone  extraction,  sphincterotomy and stent pre- and postoperatively.  Since  2003, she has had no apparent obvious trouble directly related to the  cholecystectomy and/or cholelithiasis issue.  She has had several  documented treatments and hospitalizations for epigastric and chest  pain, noncardiac in nature.  She had a normal coronary artery  catheterization in July 2008.  She has been treated for GERD.   She was admitted August 16, 2007, by cardiology after presenting to  the Coumadin clinic and doubling over in pain.  She described this pain  as being from the left side of the abdomen radiating over to the  epigastric and very severe in nature.  When she presented to the ER, she  had a leukocytosis of 18,000, mild elevation in transaminases, otherwise  was relatively stable.  Urinalysis was negative at the time.  She  subsequently began to become more sick.  Her blood cultures have been  positive for gram-negative  rods, and today she has developed shock and  respiratory failure and has been intubated and is now followed by the  critical care medicine team.  She is also on pressors for blood pressure  support.  She has had marked increase in her transaminases with a total  bilirubin now up to 8.  Dr. Matthias Hughs and GI  services have been following  since admission.   On the day of admission, because of the abdominal pain, she did have CT  angio of the chest, abdomen and pelvis to rule out cardiac issues such  as a displaced valve, aortic dissection.  She had no ischemic bowel or  any other problems noted on the CT angio.  Because of her history of  multiple retained CBD stones back in 2003 with the initial  cholecystectomy and ERCP stone retrievals and the elevation in the total  bilirubin with associated gram-negative sepsis, it is felt that the  patient probably has a primary ascending cholangitis as the etiology of  her sepsis, and ERCP is planned at 4 o'clock today.  The  patient is in  the process of having her Coumadin-induced coagulopathy reversed so she  can undergo ERCP.  Since intubation, the patient has had an NG tube  placed and plain films of the abdomen do show an increase in small bowel  dilatation consistent with either an ileus or early small-bowel  obstruction.  In addition, she has had progressive metabolic acidosis  secondary to acute renal failure.  Although Dr. Matthias Hughs is somewhat  certain that the cholangitis and retained biliary stone is the reason  for her symptoms, he requested surgical evaluation for the abdominal  pain since the patient is unstable and septic.   REVIEW OF SYSTEMS:  Unobtainable from the patient.  Most of this has  been obtained from the chart.  From the chart, it says that the patient  again was having pain on the left radiating to the epigastric.  During  exam, I was able to elicit pain from the patient.  She did open her eyes  and was able to relate to me  that the pain was more on one side than the  other.  Otherwise, she is sedated and on the ventilator.   FAMILY MEDICAL HISTORY:  Significant for atrial fibrillation,  hypertension.   SOCIAL HISTORY:  She works as a Chartered loss adjuster.  She does not utilize  alcohol or tobacco products.   PAST MEDICAL HISTORY:  1. Atrial fibrillation, on Coumadin anticoagulation.  2. Mitral valve replacement, a St. Jude valve secondary to mitral      stenosis.  This was in 1997.  3. Cardiac catheterization in July 2008 revealing normal coronary      arteries.  4. Mild pulmonary hypertension.  5. Dyslipidemia.  6. Mild chronic lower extremity edema.  7. Prior hyperthyroidism, treated with thionamide treatment, followed      by endocrinology outpatient.  8. GERD.   PAST SURGICAL HISTORY:  1. Laparoscopic cholecystectomy in 2003 by Dr. Samuella Cota.  2. Multiple ERC procedures pre- and postop for stone retrieval and      sphincterotomy and stent in 2003.  3. Anterior cervical diskectomy with fusion C5-C6 by Dr. Noel Gerold in      2007.   ALLERGIES:  NKDA.   CURRENT MEDICATIONS:  Since the patient has had change in condition, her  medications have been markedly altered.  She had previously been on  Cipro; this dosage has been adjusted based on her renal function.  She  is on IV heparin.  She is on Zosyn.  She has also been started on Neo-Synephrine for pressor support.  Her dopamine and Cardizem drip and morphine have been discontinued.  Oral medicines have been placed on hold,  including her Coumadin.  She is receiving FFP to reverse her coagulopathy, and she is receiving  other sedative medication while she is on the vent and has orders for  frequent Glucometer checks and use of the glucommander as well as the  critical care sedation per ventilated patient protocol.   PHYSICAL EXAMINATION:  GENERAL:  She is a sedated patient who is flushed  in the face and neck who is somewhat toxic-appearing with previous   complaints of severe epigastric abdominal pain.  VITAL SIGNS:  The patient was afebrile earlier this morning.  Prior to  admission she was having temperature spikes to 102 at home.  Current  blood pressure is 91/55, pulse is irregular and 71, respirations are 18.  She is satting 98%.  HEENT:  Eyes:  Sclerae are injected and somewhat icteric.  Conjunctivae  are pale.  Pupils are equal and react to light.  Ears, nose, mouth and  throat:  Ears are normal in appearance.  Nose is without bleeding or  rhinorrhea.  Mouth is pink and moist.  Endotracheal tube is in place.  NECK:  Trachea is midline.  Thyroid is nonpalpable.  RESPIRATIONS:  Her effort is nonlabored, but she is sedated.  She is  currently on a ventilator, but she does have spontaneous respiratory  effort.  Her bilateral lung sounds are clear to auscultation anteriorly  but somewhat diminished in the bases.  Did not listen posteriorly.  CARDIAC:  Heart sounds are S1, S2 with an audible valvular click in the  left sternal border, fourth intercostal space.  Bedside telemetry  demonstrates atrial fibrillation with a controlled ventricular rate in  the 70s.  CHEST/BREAST:  Deferred.  GASTROINTESTINAL:  Abdomen is soft, slightly distended.  Bowel sounds  are absent.  On clinical exam she has no obvious hepatosplenomegaly  noted.  No hernias.  No masses.  She is tender in the upper quadrants,  right being greater than left.  LYMPHATICS:  Deferred exam.  MUSCULOSKELETAL:  Symmetrical in appearance without obvious clubbing or  cyanosis.  SKIN:  There is a well-healed midline vertical scar in the chest from  prior valve surgery.  NEURO:  Please note the patient is sedated on the ventilator.  She does  open eyes to pain, is appropriate and to nodding to questions asked but  drifts back off easily.  From this limited ability to assess, her  cranial nerves II-XII appear grossly intact.  DTRs were not checked.  Gross sensation appears intact  in the upper and lower extremities.  For  obvious reasons, gait and ambulation were not assessed.  PSYCH:  Again we are unable to rouse the patient.  She is oriented to  name and opens her eyes appropriately, otherwise unable to appropriately  assess orientation due to sedation and endotracheal tube placement.   LABORATORIES:  CK today is 302, MB 14.3, relative index 4.7.  Troponin I  is 1.33.  PT is 2.7.  A lactic acid has been checked and is 4.4.  Creatinine has gone up to 2.92.  The BUN is 31, sodium 134, potassium  3.7, CO2 18, glucose 126.  White count on admission was 18,100.  Earlier this morning the white  count was 29,400, now is up to 38,500 with a hemoglobin of 10.9.  Initial amylase and lipase were normal.  An amylase has just been  checked post intubation, is now up to 558.  Lipase is normal now.  Initial urinalysis was negative.  Repeat urinalysis today shows some  abnormalities that could be consistent with either a UTI or progression  in renal failure.  Her blood cultures are positive, 2 sets of gram-negative rods.  Total  bilirubin this morning was 8, alkaline phosphatase 56, AST 107, ALT 76.  Diagnostic CT angio of the chest, abdomen and pelvis on admission showed  no obvious acute problems.   IMPRESSION:  1. Sepsis, shock with associated ventilatory-dependent respiratory      failure, managed by critical care medicine.  2. Suspected ascending cholangitis secondary to retained stone, prior      history of cholecystectomy with multiple stones and multiple ERCPs      and stone extractions in 2003.  3. Transaminitis, multifactorial etiology.  4. Acute renal failure.  5. Atrial fibrillation, rate controlled currently on anticoagulation      with  therapeutic INR.  6. Mitral valve replacement, a St. Jude valve due to history of mitral      stenosis.  7. Ileus, early small-bowel obstruction.   PLAN:  1. Await ERCP today.  There is no clear general surgery issue at  this      time.  I would hope decompression of her biliary tree will solve      her immediate source of infection.  2. Lactic acid is mildly elevated at 4.4.  This is an expected result      given  that the patient has shock, sepsis and acute renal failure.      At this point it does not seem indicative of ischemic bowel or      other issues.  Lipase is normal and amylase is elevated at 558.      This seems consistent with acute abdominal process.  ERCP again      will be helpful in delineating etiology of elevated amylase.  3. Due to the ileus would go ahead and make NPO and have NG tube to      low wall suction.  4. Agree with empiric antibiotic coverage, broad-spectrum as noted.      This is done per critical care medicine, and follow up on any      repeat cultures, urinalysis, etc.      Revonda Standard L. Rennis Harding, N.P.      Sandria Bales. Ezzard Standing, M.D.  Electronically Signed    ALE/MEDQ  D:  08/18/2007  T:  08/19/2007  Job:  [id number]   cc:   Bernette Redbird, M.D.  Thomas C. Wall, MD, [facility]

## 2010-11-05 NOTE — Consult Note (Signed)
[name], [name]              ACCOUNT NO.:  [id number]   MEDICAL RECORD NO.:  [id number]          PATIENT TYPE:  INP   LOCATION:  2902                         FACILITY:  [facility]   PHYSICIAN:  [name], M.D.   DATE OF BIRTH:  02-22-1956   DATE OF CONSULTATION:  08/18/2007  DATE OF DISCHARGE:                                 CONSULTATION   REFERRING PHYSICIAN:  Jesse Sans. Wall, MD, FACC   REASON FOR CONSULTATION:  Dr. Daleen [name] asked [place] to see this 55 year old  female because of abdominal pain.   [name] was admitted to the hospital 2 days ago from the Coumadin  Clinic where she presented coincidentally with a moderate amount of  abdominal pain.  She was apparently doubled over.  It seemed to hurt  mostly in her left flank.  She was admitted to the hospital where CT  angiography was negative for any evidence of embolic disease to the  intestinal tract or other overt abdominal abnormalities.  She was  started on Zosyn and grew gram-negative rods out of her blood.   Over the ensuing 36 hours or so, however, the patient has deteriorated.  She has basically gone into septic shock.  Her renal function has  deteriorated.  Her white count has climbed significantly and she is  showing signs of acidosis with a bicarb of 18.  There has also been  noted elevation of her liver chemistries at the time of admission,  although amylase and lipase were normal.   The patient points to her epigastric area when asked where the pain is  the worst.   In this regard, it is noteworthy that in 2003, she had common bile duct  stones.  These were removed through several sphincterotomies both prior  to and following a laparoscopic cholecystectomy by Dr. Paulla [name].  Specifically, an initial sphincterotomy was performed, but when she then  went to the laparoscopic cholecystectomy, intraoperative cholangiography  showed evidence of a residual stone.  Followup ERCP, showed a clot at  the sphincterotomy  site (on Coumadin because of prosthetic St. Jude  mitral valve and atrial fibrillation), so a stent was placed.  Subsequent ERCP failed to remove any definite stones, but then her  bilirubin went up, so another ERCP was done.  Ultimately, after one or  two additional attempts, the patient underwent an ERCP with a basket to  crush the stones and is felt to have removed all debris.  Subsequent  liver chemistries have been consistently normal over the past several  years as noted on E-chart, until the time of this admission.   Note, that the CT scan of the abdomen and pelvis on this admission shows  minimal progression of biliary ductal dilatation from several years  earlier.  The CBD has gone from about 10-mm to about 11-mm in that  period of time.   ALLERGIES:  No known drug allergies.   MEDICATIONS PRIOR TO ADMISSION:  Coumadin, digoxin, HCTZ, simvastatin  and omeprazole.   CURRENT MEDICATIONS:  In the hospital, she had been on Zosyn, but that  was switched to Cipro and  vancomycin.  She has been maintained on  simvastatin, Protonix, heparin and diltiazem.   PAST SURGICAL HISTORY:  1. St. Jude mitral valve replacement in 1997.  2. Laparoscopic cholecystectomy in 2003.   HABITS:  Nonsmoker, nondrinker.   FAMILY HISTORY:  Not obtained, but is apparently positive for  hypertension, per review of the chart.   REVIEW OF SYSTEMS:  The patient does indicate that she had a temperature  of 102 as an outpatient prior to admission and that she also had shaking  chills.  There had been apparently no vomiting or change in bowel  habits.   PHYSICAL EXAMINATION:  VITAL SIGNS:  At the time of my evaluation, the  patient's systolic pressure was about 90.  GENERAL:  She was somewhat somnolent, but coherent and awake.  She was  intubated shortly after my evaluation.  She was without frank pallor.  ABDOMEN:  The abdomen is somewhat adipose, but has quiet bowel sounds  and some mild to moderate  epigastric tenderness and guarding without any  peritoneal findings.  No frank distention or tympany.  CHEST:  No overt chest abnormalities.  CARDIAC:  Heart sounds have crisp prosthetic valve sounds without  obvious murmur.   LABORATORY DATA AND X-RAY FINDINGS:  Labs on white count has climbed  from 18,000 on admission to 19.9 yesterday to 29,400 today, hemoglobin  has dropped from 14.3-10.9, platelets have dropped from 252,000-116,000.  On admission, the patient had 92 polys and 5 lymphs.  INR was 2.1 on  admission and 2.7 as of this morning.  Liver chemistries were abnormal  on admission with bilirubin of 1.4, AST 136, ALT 54 with normal Alk  phos.  Today, bilirubin has jumped up to 8.0 with AST 107, ALT 76.  Troponins are elevated and the patient's CK-MB is also positive to 14.3  (relative index 4.7).  Urinalysis on admission showed 3-6 wbc's and a  few bacteria with negative nitrite and small leukocytes on dipstick.  Urine culture showed 40,000 colonies with multiple flora.  Blood culture  shows gram-negative rods in 2/2 specimens.   IMPRESSION:  1. Abdominal pain, fever and chills.  2. Progressive leukocytosis.  3. Gram-negative bacteremia with septic shock picture.  4. Elevation of liver chemistries.  5. Prior history of common duct stone, status post sphincterotomy and      stone extraction about 5 years ago.  6. Complicating factor of chronic anticoagulation because of mitral      valve which is thought to be at high risk for thromboembolic      events, as well as underlying atrial fibrillation and enlarged      heart.   DISCUSSION:  I am suspicious of cholangitis for several reasons.  One of  them is the new-onset elevation of liver chemistries.  Note, these were  up even at the time of presentation before the patient was frankly  septic, thus making reactive hepatopathy somewhat less likely.  Moreover, she has a prior history of common duct stones, has had upper   abdominal pain, mild progression of her biliary dilatation, shaking  chills and fevers all of which would be compatible with septic  cholangitis.  Moreover, we have no reasonable alternative source of the  gram-negative bacteremia.  Specifically, there is no evidence of bowel  perforation, obvious ischemia or pyelonephritis nor does the urinalysis  or culture support the diagnosis of urosepsis.  I have reviewed the CT  carefully with the radiologist downstairs.   RECOMMENDATIONS:  I think  the patient should have an urgent ERCP.  The  patient has now been intubated and has a central line in place, is on  pressors and is reasonably stable for the procedure.  She will be  receiving FFP to correct her INR and her heparin has been held.  Hopefully, she can withstand the procedure, but my concern is that in  view of her progressive deterioration despite antibiotic therapy, if  biliary obstruction and sepsis are present and drainage is not achieved,  it will be hard for her to improve clinically.  Obviously, she is a high-  risk patient.   To help be sure that we are not missing some alternative intra-abdominal  process, I have asked general surgery to see the patient and have spoken  with the nurse practitioner, to have them consult on the patient this  afternoon.   Further management would depend on the findings of ERCP.   I have explained the need for ERCP to the patient prior to her  intubation.  She is familiar with the procedure from before and is  agreeable to proceed.  At this time, I have attempted to locate the  patient's father to obtain further family consent on the patient's  behalf, but there is only an answering machine at their home number and  he is not in the ICU waiting room at the present time, but I have asked  to be called if and when he is located.   Total time for this consult was approximately 90 minutes.           ______________________________  [name], M.D.     RB/MEDQ  D:  08/18/2007  T:  08/19/2007  Job:  [id number]

## 2010-11-05 NOTE — Op Note (Signed)
[name], [name]              ACCOUNT NO.:  [id number]   MEDICAL RECORD NO.:  [id number]          PATIENT TYPE:  AMB   LOCATION:  DSC                          FACILITY:  [facility]   PHYSICIAN:  Harvie Junior, M.D.   DATE OF BIRTH:  06/22/1956   DATE OF PROCEDURE:  12/10/2007  DATE OF DISCHARGE:                               OPERATIVE REPORT   The patient is a 55 year old female.   PREOPERATIVE DIAGNOSIS:  A triangular fibrocartilage tear with  degenerative changes in the wrist and ulnar positive wrist.   POSTOPERATIVE DIAGNOSIS:  A triangular fibrocartilage tear with  degenerative changes in the wrist and ulnar positive wrist.   FINDINGS OF PROCEDURE:  1. Operative wrist arthroscopy with debridement of triangular      fibrocartilage tear and debridement of chondromalacia both the      radial side and on the carpal side.  2. Ulnar shortening osteotomy with the Rayhack plating system.   SURGEON:  Harvie Junior, MD   ASSISTANT:  Marshia Ly, PA   ANESTHESIA:  General.   BRIEF HISTORY:  [name] is a 55 year old female with a long history  of having had significant pain in her wrist.  We treated her  conservatively for a period of time.  A CT scan showed that she had  significant TFC tear as well as some degenerative changes in the wrist.  We talked about treatment options and ultimately felt that the most  appropriate course of action for her was going to operative wrist  arthroscopy.  Preoperative imaging showed that she had an ulnar positive  wrist variant.  We felt that we needed to shorten her across the wrist.  At this point, she was brought to the operating room for this procedure.   PROCEDURE:  The patient was brought to the operating room.  After  adequate anesthesia was obtained with general anesthetic, the patient  was placed supine on the operating table.  The right wrist was then  prepped and draped in the usual sterile fashion.  Following this, the  arm  was exsanguinated with blood pressure tourniquet was inflated to 250  mmHg.  Following this, a wrist arthroscopy was undertaken with the arm  in the wrist tower and 10 pounds of distraction.  This easily got into  the wrist.  It showed there was some degenerative changes in the  scapholunate ligament, some degenerative changes in the lunatotriquetral  ligament, as well as some degenerative changes along the radius.  These  were all grade 2 in nature.  These were all cleaned up with a shaver and  a ArthroCare wand.  Attention turned to the TFC.  The TFC had a central  perforator tear.  We cleaned up the edges of this and cleaned it up,  looked at the ulnar head through the tear.  The ulnar head looked to be  without significant degenerative changes and at this point, we felt that  the most appropriate course of action would be ulnar shortening  osteotomy.  At this point, the wrist scope was removed.  The wrist was  suctioned dry.  Attention was then turned towards the ulna where a  linear incision was made, subcutaneous tissue taken down to level of the  ulna.  The ulna was clearly identified.  A Rayhack plating system was  then used.  The cutting guide was initially used and stabilized to the  bone and then the cuts were made for a 3-mm ulnar shortening.  Once this  was completed, the plate was then attached and the compression guide was  attached, we got dramatic compression across the osteotomy site and once  that was completed, we attached the remainder of the plate in a standard  AO style fashion.  There was a little bit of tension placed on the plate  to help create the appropriate create the appropriateness for the  osteotomy as well.  Once this was completed, the fluoro was used.  Screw  lengths were all measured.  Great compression across the osteotomy,  great interfragmentary compression, and the wrist looked to be nicely  ulnar negative at this point.  There was a 3-mm shortening.   Once this  was completed, the wound was copiously and thoroughly irrigated.  Some  bone grafting was put around the area of the fracture and then the ulnar  musculature was closed over the plate.  The skin was closed with 0 and 2-  0 Vicryl and 3-0 Monocryl subcuticular stitching and Benzoin and Steri-  Strips were applied.  Sterile compression dressing was applied as well  as a sugar-tong splint.  The patient was taken to the recovery room.  She was noted to be in a satisfactory condition.  Estimated blood loss  for this procedure was none.      Harvie Junior, M.D.  Electronically Signed     JLG/MEDQ  D:  12/10/2007  T:  12/11/2007  Job:  [id number]

## 2010-11-05 NOTE — Assessment & Plan Note (Signed)
[facility] HEALTHCARE                            CARDIOLOGY OFFICE NOTE   NAME:Dempster, MELANA HINGLE                     MRN:          [id number]  DATE:09/03/2007                            DOB:          1955/07/03    Ms. Osburn returns after a very complicated hospital course.   She was admitted through our office after having the acute onset of  abdominal pain.  I do not have a discharge summary but I will summarize  as follows:   1. She was admitted and blood cultures grew out gram-negative rods      consistent with Klebsiella.  Fortunately, they were sensitive to      Rocephin which we had started her on, on admission.  2. She was status post cholecystectomy but it was found out by Dr.      Matthias Hughs she had new stones and sludge in her gallbladder.  She      underwent ERCP with resolution of her problems.  3. Septic shock with subsequent need for intubation and acute renal      failure.  4. Acute renal failure, now resolving.  5. Severe anemia necessitating 2 units of blood before discharging      home the past weekend.  6. Prolonged antibiotics which she has now finished.  7. Anticoagulation which we had to be very careful with and was      checked today.  Her INR is 2.0.  8. Chronic atrial fibrillation.  She became extremely bradycardic in      the hospital probably from sepsis and now is back off of her Toprol      and digoxin as instructed, with a heart rate of about 90-100.  She      is asymptomatic.  9. Hyperlipidemia.  We stopped her statins because of her acute      illness.  We will start them back in the future.   Her biggest complaint right now is just fatigue and not having a lot of  appetite.  She has had some watery diarrhea, particularly after she  eats.  She denies any blood in her stool but does have black stool from  her iron.  She denies any mucus or pus or other abnormalities with her  stool.  She has had no fever, chills, sweats.  She  denies any shortness  of breath or chest pain.   Her medications today are iron supplement three times a day, Coumadin as  directed.   Her blood pressure today is 163/92 in the left arm.  I rechecked it, and  it was actually 140/80.  Heart rate is 95 and irregular.  Weight is 176.  HEENT:  She is pale, her eyes are nonicteric.  PERRL.  Extraocular  movements  intact.  Facial symmetry is normal.  Dentition is  satisfactory.  NECK:  Carotids upstrokes were equal bilaterally without bruits.  There  is no JVD.  Thyroid is not enlarged.  Trachea is midline.  LUNGS were clear.  HEART:  Reveals a prosthetic S1, soft diastolic inflow murmur.  No  gallop, irregular irregular  rate. ABDOMEN:  Soft, good bowel sounds.  No  tenderness.  There is no obvious organomegaly.  EXTREMITIES:  No  cyanosis, clubbing or edema.  Pulses were present.  NEURO:  Exam is intact.  SKIN is pale.  She has some ecchymoses from all her IV sticks.   ASSESSMENT/PLAN:  Ms. Ashton has  made significant recovery and is  slowly regaining her strength.  Her INR is 2 and her Coumadin will be  adjusted accordingly.  I am concerned about her diarrhea which could be  C.  Difficile associated.  Will check a level today.  I am also  concerned about resolving kidney issues from the hospital and we will  check a Chem-7 today.   Also we will start her back on her digoxin and her Toprol.  I will see  her back in 2 weeks for careful follow-up.     Thomas C. Daleen Squibb, MD, [facility]  Electronically Signed    TCW/MedQ  DD: 09/03/2007  DT: 09/04/2007  Job #: [id number]

## 2010-11-05 NOTE — Op Note (Signed)
[name], [name]              ACCOUNT NO.:  [id number]   MEDICAL RECORD NO.:  [id number]          PATIENT TYPE:  INP   LOCATION:  2902                         FACILITY:  [facility]   PHYSICIAN:  John C. Madilyn Fireman, M.D.    DATE OF BIRTH:  March 23, 1956   DATE OF PROCEDURE:  08/18/2007  DATE OF DISCHARGE:                               OPERATIVE REPORT   PROCEDURE:  Endoscopic retrograde cholangiopancreatography with stent  placement and stone removal.   INDICATIONS FOR PROCEDURE:  Strongly suspected cholangitis with fever,  leukocytosis, frank sepsis and rapidly-developing elevated liver  function tests and jaundice.   PROCEDURE:  The patient was intubated and the procedure done in the  operating room under general anesthesia.  Informed consent was obtained  from the patient's family after risks and alternatives discussed with  Dr. Matthias Hughs.  The Olympus side-viewing endoscope was passed into the  oropharynx and esophagus and stomach.  The pylorus was traversed and the  papilla of Vater located on the medial duodenal wall.  It had an  appearance consistent with prior sphincterotomy, was somewhat open and  there was some thick but amber-colored bile seen to egress from it.  The  Wilson-Cook sphincterotome was advanced fairly easily into the bile  duct, which was diffusely dilated.  The intrahepatic radicles filled  fairly well and there were gravelly-like particles that were seen to  exit as well as some thick, opaque bile although it was not frankly  purulent.  I advanced an adjustable 50-mm balloon catheter up into the  common hepatic duct, inflated it and pulled it down with clearance of  the dye and some more sludgy bile but no discrete stones delivered.  There appeared to be more clearer amber bile later on the procedure  after exiting after I had injected 2 or 3 syringe-fulls of dye to flush  the duct out.  Finally a 10-French, 5-cm stent was placed into the  common bile duct with a  good positioning and fairly clear, amber bile  seen to exit from it at the end of the procedure.  There was no residual  visible dye in the duct on post stent placement film that was taken at  the termination of the procedure.  The scope was then withdrawn and the  patient returned to the recovery room in stable condition.  She  tolerated the procedure well and there were no immediate complications.   IMPRESSION:  Confirmation of infected-appearing bile suggesting  cholangitis as the source of her infection.   PLAN:  Will continue antibiotics and monitor for clinical improvement  and by trends in her liver function tests and white blood cell count and  continue intensive supportive care as needed.           ______________________________  Everardo All Madilyn Fireman, M.D.     JCH/MEDQ  D:  08/18/2007  T:  08/19/2007  Job:  [id number]

## 2010-11-05 NOTE — Op Note (Signed)
[name], [name]              ACCOUNT NO.:  [id number]   MEDICAL RECORD NO.:  [id number]          PATIENT TYPE:  INP   LOCATION:  4702                         FACILITY:  [facility]   PHYSICIAN:  Graylin Shiver, M.D.   DATE OF BIRTH:  11/26/1955   DATE OF PROCEDURE:  09/09/2007  DATE OF DISCHARGE:                               OPERATIVE REPORT   PROCEDURE:  Endoscopic retrograde cholangiogram with stent removal.   INDICATION FOR PROCEDURE:  The patient came in with symptoms suggestive  of cholangitis.  She has had a recent prolonged hospitalization with  ERCP and biliary stent placement for cholangitis by [name] a few  weeks ago.  Although no stones were seen her duct was previously swept  and sludge was removed and debris.  She presents again with symptoms  suggestive of cholangitis and it is of concern that her stent may be  clogged or that she has a stone in the biliary tree.   Informed consent was obtained after explanation of the risks of  bleeding, infection, perforation and pancreatitis.   PREMEDICATION:  Fentanyl 125 mcg IV, Versed 10 mg IV, glucagon 0.5 mg IV  given during the procedure.   PROCEDURE IN DETAIL:  With the patient lying on her abdomen the lateral  viewing duodenoscope was inserted into the oropharynx and passed into  the esophagus.  It was advanced down the esophagus then into the stomach  and into the duodenum.  The scope was advanced down to the papilla of  Vater region.  The stent was seen.  I could see some bile flowing around  the stent.  The stent was grabbed with a snare and removed.  It was  brought back and dropped into the stomach.  The scope was then re-  advanced back to the papilla.  Selective cannulation of the biliary tree  was achieved with a guidewire and papillotome.  Contrast was injected  into the biliary tree using a balloon catheter.  I saw no evidence of  definite stones.  I saw good bile drainage after the stent was removed.  I saw no debris or pus.  The balloon was advanced up into the proximal  duct up around the bifurcation.  It was inflated to 12 mm.  The duct was  swept and I only saw yellow and greenish colored bile come out.  I saw  no stones, debris, pus or any abnormalities.  There were no obvious  filling defects in the biliary tree to suggest stones.  The scope was  then removed.  I then inserted a forward viewing scope and examined the  duodenal bulb which looked normal.  The stomach looked normal.  The  stent was grasped and pulled out.  No lesions were seen in the  esophagus.  She tolerated the procedure well without complications.   IMPRESSION:  Endoscopic retrograde cholangiogram with stent removal and  bile duct swept with 12 mm balloon.  No trouble in passing the 12 mm  balloon through the prior sphincterotomy site.  There were no stones or  debris or  pus seen.  The duct looked clear on final injection.  I saw no reason to  reinsert a stent in the biliary tree.  The stent that was removed was  examined after it was removed and it looked like it was clogged with  debris.  We will recheck liver enzymes.           ______________________________  Graylin Shiver, M.D.     SFG/MEDQ  D:  09/10/2007  T:  09/11/2007  Job:  [id number]   cc:   Bernette Redbird, M.D.  John C. Madilyn Fireman, M.D.
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
## 2010-11-05 NOTE — Assessment & Plan Note (Signed)
[facility] HEALTHCARE                                 ON-CALL NOTE   NAME:[name], [facility]                       MRN:          [id number]  DATE:06/26/2007                            DOB:          [birth year]    Ms. [name] is a 55 year old female with a prior history of mitral valve  replacement with St. Jude's mitral valve in 1997.  She is on chronic  Coumadin therapy.  She is scheduled to have an MRI of her hand and [facility] told her to stop her aspirin the night before.  She states  she does not take aspirin.  She is on Coumadin, though, and has  questions whether or not she should be stopping her Coumadin.  I said  that from my perspective I do not fully understand why they would have  her stop the aspirin for an MRI and we would advise against stopping her  Coumadin with her history of a prosthetic mitral valve for this  radiologic study.  She said that she will touch base with the Radiology  Center on Monday and give [place] a call back if need be.     Nicolasa Ducking, ANP  Electronically Signed    CB/MedQ  DD: 06/26/2007  DT: 06/26/2007  Job #: [id number]
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
## 2010-11-05 NOTE — Discharge Summary (Signed)
NAMEAIYONNA, [name]              ACCOUNT NO.:  [id number]   MEDICAL RECORD NO.:  [id number]          PATIENT TYPE:  INP   LOCATION:  2019                         FACILITY:  [facility]   PHYSICIAN:  Rollene Rotunda, MD, FACCDATE OF BIRTH:  Sep 25, 1955   DATE OF ADMISSION:  12/25/2006  DATE OF DISCHARGE:  12/29/2006                               DISCHARGE SUMMARY   PRIMARY CARDIOLOGIST:  [name].   DISCHARGE DIAGNOSIS:  Chest pain.   SECONDARY DIAGNOSES:  1. History of severe mitral stenosis status post mitral valve      replacement with a St. Jude's valve in 1997.  2. History of rheumatic heart disease.  3. Chronic atrial fibrillation.  4. Chronic Coumadin anticoagulation.  5. Hypertension.  6. Hyperlipidemia.  7. GERD.  8. History of mild pulmonary hypertension.  9. Status post that cholecystectomy.  10.Status post cervical laminectomy.  11.Urinary tract infection.   ALLERGIES:  NO KNOWN DRUG ALLERGIES.   PROCEDURE:  Left heart cardiac catheterization.   HISTORY OF PRESENT ILLNESS:  A 55 year old Caucasian female with history  of rheumatic heart disease and severe mitral stenosis status post mitral  valve replacement with a St. Jude's mitral valve in 1997.  She was in  her usual state of health until 10:00 p.m. on December 24, 2006, when she  developed 10/10 epigastric discomfort with radiation to the left arm  associated with diaphoresis.  She presented to the [facility] emergency  room where she was treated nitroglycerin GI cocktail in the ED with  relief of symptoms.  The cardiac markers were negative and she was  transferred to [facility] for further evaluation.   HOSPITAL COURSE:  The patient's Coumadin was held and decision was made  to pursue cardiac catheterization as she did have some lateral ST and T-  wave changes on ECG.  She had no recurrent chest discomfort over the  weekend, and as her INR came down she underwent left heart cardiac  catheterization on  Monday, July 7, which revealed normal coronary  arteries.  She has been ambulating post procedure without recurrence to  discomfort or limitations.  She has been reinitiated on Coumadin.  We  will arrange for Lovenox bridging as an outpatient.  She will be  discharged home today in satisfactory condition.   DISCHARGE LABS:  Hemoglobin 10.3, hematocrit 31.1, WBC 8.2, platelets  222, MCV 86.9.  Sodium 140, potassium 4.0, chloride 107, CO2 27, BUN 9,  creatinine 1.06, glucose 113.  PT 15.3, INR 1.2, total bilirubin 0.8,  alkaline phosphatase 73, AST 24, ALT 27, albumin 3.1, CK 25, MB 0.9,  troponin I 0.02, total cholesterol 140, triglycerides 73, HDL 43, LDL  82, calcium 8.6, magnesium 2.1.  Urinalysis showed moderate leukocyte  esterase, many bacteria, 11-20 WBC, nitrate was negative.  TSH was  1.098.  BNP was 48.0.  Digoxin level was 1.0.  D-dimer was 0.22.   DISPOSITION:  The patient is being discharged home today in good  condition.   FOLLOW-UP APPOINTMENTS:  She has Coumadin Clinic follow-up at our  [facility] in [place], July 10 at  12:00 p.m..  We will  arrange for follow with Dr. Daleen Squibb in our [place] office in  approximately 2 weeks.   DISCHARGE MEDICATIONS:  1. Lovenox 80 mg subcu b.i.d. until INR therapeutic.  2. Coumadin as previously prescribed.  3. Cipro 5 mg one dose this evening, one dose tomorrow morning, and      then discontinue.  4. Digoxin 0.125 mg daily.  5. Vytorin 10/40 mg daily.  6. HCTZ 12.5 mg daily.   OUTSTANDING LAB STUDIES:  None.   DURATION DISCHARGE ENCOUNTER:  Forty minutes including physician time.      Nicolasa Ducking, ANP      Rollene Rotunda, MD, [facility]  Electronically Signed    CB/MEDQ  D:  12/29/2006  T:  12/29/2006  Job:  [id number]

## 2010-11-05 NOTE — Op Note (Signed)
[name], [name]              ACCOUNT NO.:  [id number]   MEDICAL RECORD NO.:  [id number]          PATIENT TYPE:  AMB   LOCATION:  DSC                          FACILITY:  [facility]   PHYSICIAN:  Harvie Junior, M.D.   DATE OF BIRTH:  1955-09-15   DATE OF PROCEDURE:  10/06/2008  DATE OF DISCHARGE:                               OPERATIVE REPORT   PREOPERATIVE DIAGNOSIS:  Nonunion, right ulna.   PREOPERATIVE DIAGNOSIS:  Nonunion, right ulna.   PRINCIPAL PROCEDURES:  Open reduction and internal fixation and repair  of nonunion, right ulnar fracture with iliac crest bone grafting.   SURGEON:  Harvie Junior, MD   ASSISTANT:  Marshia Ly, PA   ANESTHESIA:  General.   BRIEF HISTORY:  [name] is a 55 year old female with a history of  having had significant pain in her right wrist.  We have done a TFC  debridement and an ulnar shortening.  She did great with her wrist pain  but began having pain with the hardware and also at the nonunion site  pain.  We waited about 9 months, got her bone stimulator and had all  sorts of efforts to try to heal this, but certainly, she had a  radiographic nonunion.  We talked about the treatment options and  ultimately felt that most appropriate course of action was going to be  observation.  Once we got about 9 months and still having pain, it now  seemed to isolate right at the nonunion site.  We felt that we needed to  go back and try to take this down and heal.  She was brought to the  operating room for this procedure.   PROCEDURE:  The patient was brought to the operating room, and after  adequate anesthesia was obtained with general anesthetic, the patient  was placed supine on the operating table.  A bump was placed under the  right hip, and this hip was toweled out.  The right arm was prepped and  draped in usual sterile fashion, and the hip was prepped and draped in  the usual sterile fashion.  Following this, a small incision was  made  over the iliac crest, subcutaneous tissue down to the level of the iliac  crest, care being taken for meticulous hemostasis using the Bovie with  entire way down.  Once we got down the iliac crest, the periosteal layer  was divided and tagged, and then, we made a trapdoor, in essence we made  a longitudinal anterior cut in the iliac crest and then 2 horizontal  cuts and then folded that back carefully maintaining the integrity of  this.  Once we did that, we harvested significant amounts of bone from  the iliac crest between the 2 tables.  Once this was completed, a bone  wax was used to try to control the bleeding in this from the cancellous  bone.  Once that was completed, a Gelfoam was placed into the iliac  crest, and this really controlled the bleeding very nicely at this  point.  At this point, Vicryl was used through  the cap, which was folded  back down and then through the anterior or what would be the outer  table, and this really got a very nice bony closure of the iliac wing.  At this point, the muscles over the iliac crest were sewn in an  interrupted fashion giving a nice pants-over-vest closure in this place.  Once this was done, the skin was closed with 2-0 Vicryl and then 3-0  Monocryl subcuticular stitching.  Benzoin, Steri-Strips were applied,  and attention was then turned to the right arm.  At this point, the  incision was made over the old incision.  Subcutaneous tissue taken down  to the level of the plate, and the plate was carefully identified, and  an elevator was used to strip all the areas off from the plate and the  bone.  Once this was done, circumferential access was gained right at  the fracture site for about 2 inches, and then, the remainder of the  soft tissue attachments were left in place.  The locks were made on the  bone to gain rotational alignment, and then, we scalloped the bone, took  the plates off, used a drill to get into both  intermedullary canals and  open this up, and then, stuff the bone graft into both intermedullary  canals.  Once that was completed, the rotational alignment was then  regained, and a dynamic compression plate from the small fragments that  was now used, and we did a double compressive technique, compressing  this perfect with alignment.  Once this was completed, we took a 1/4  inch osteotome fish scaled this area in 360 degrees.  We then took a  strip of Infuse.  We reconstituted on the back table for 15 minutes and  then took a 1 x 2 inch strip of Infuse and placed it, laying it over the  fracture site underneath, and then, we packed a tremendous amount of  bone graft over this scalloped and roughened area where there was  punctuate bleeding on both sides of the wound.  We packed a significant  amount of bone graft in this area and then wrapped it in Infuse so that  it would actually act as a sort of keeping the bone graft in place.  Once this was done, we stuffed bone graft to the plate and threw the old  screw, that was an interfragmentary screw, and so we really felt like we  had done a very generous and nice job with iliac crest bone graft and  had plenty of  graft to make great contact structurally around the bone  in all areas.  Once that was completed, the fascia of the ulna was  closed with interrupted suture just to hold this Infuse and the bone  graft in place, and then, the skin was closed with 2-0 Vicryl and skin  staples.  A sterile compressing dressing was applied on the arm, and  then, a sugar tong splint.  The case was done in a tourniquet control.  The tourniquet was up for about 90 minutes.  The patient at this point  was taken to recovery room.  She was noted to be in satisfactory  condition.  She does have a history of a mechanical valve, so she had  been taken off for Coumadin and placed on Lovenox and we will do this as  a bridge,  we will wait another 12 hours after  her surgery and then  start her  back on the Lovenox.  We will plan on seeing her back in the  office early part of next week just to make sure she is doing okay  relative to being on Lovenox after an iliac crest bone graft.   Of note, during the case, Marshia Ly was instrumental in helping with  harvesting the bone graft and then closing this wound while I proceeded  onto the other case, which saved the surgical time and then also was  instrumental in positioning and participating in the second half of the  case.      Harvie Junior, M.D.  Electronically Signed     JLG/MEDQ  D:  10/06/2008  T:  10/07/2008  Job:  [id number]

## 2010-11-05 NOTE — Assessment & Plan Note (Signed)
[facility]                            CARDIOLOGY OFFICE NOTE   NAME:[name], [name]                     MRN:          [id number]  DATE:09/17/2007                            DOB:          28-Aug-1955    [name] returns today after being discharged from the hospital 4 days  ago.  She had recurrent cholangitis treated by Dr. Nadine Counts Buccini.   She resolved spontaneously without further intervention other than  removing her biliary stent.   She looks remarkably good today.  She is still somewhat fatigued.  She  denies any nausea, vomiting, diarrhea or abdominal pain.   Her INR today is 2.8.   PROBLEM LIST:  Are outlined in the previous notes.   Her meds today are metoprolol extended release 50 mg a day, simvastatin  4 mg q.h.s., iron supplement 45 mg p.o. t.i.d., Coumadin.   Her blood pressure today is 105/80 to 118/80, her pulse is 83 and  irregular.  Her weight is 159, down from 176.  Her skin color is good.  She looks much less anemic.  Sclerae are nonicteric.  HEENT otherwise unchanged.  Carotids are upstrokes are equal bilaterally.  Thyroid is not enlarged.  Trachea is midline.  Lungs are clear.  Heart reveals a prosthetic S1, irregular rate and rhythm.  Abdominal exam is soft, good bowel sounds.  Extremities reveal no edema.  Pulses are intact.  Neuro exam is intact.   I had a nice chat with [name] today.  She is significantly improved.  She has been rate responsive to digoxin and beta-locker in the past,  without I will re-add her digoxin 0.25 mg a day.  I have asked her to  stay out of work until September 27, 2007.  She is returning for a pro time  in about 10 days.  I will see her back in 3 weeks.     Thomas C. Daleen Squibb, MD, [facility]  Electronically Signed    TCW/MedQ  DD: 09/17/2007  DT: 09/18/2007  Job #: [id number]   cc:   Bernette Redbird, M.D.

## 2010-11-05 NOTE — Discharge Summary (Signed)
[name], [name]               ACCOUNT NO.:  [id number]   MEDICAL RECORD NO.:  [id number]            PATIENT TYPE:   LOCATION:                                 FACILITY:   PHYSICIAN:  Thomas C. Wall, MD, [facility]    DATE OF BIRTH:   DATE OF ADMISSION:  DATE OF DISCHARGE:                               DISCHARGE SUMMARY   PRIMARY CARDIOLOGIST:  Thomas C. Wall, MD, FACC   CONSULTING PHYSICIAN:  1. Bernette Redbird, M.D.  2. Sandria Bales. Ezzard Standing, M.D.   PROCEDURES PERFORMED DURING HOSPITALIZATION:  1. Endoscopic retrograde cholangiopancreatography with placement of      stent to the distal bile duct.      a.     Bile duct dilated and noted to have infected appearing but       frankly purulent bile with sludge with small stones removed and       balloon completed.  No discrete stone seen afterwards.  The       patient had 10 French stent placed per Dr. Matthias Hughs.   FINAL DIAGNOSES:  1. Cholangitis.      a.     Status post endoscopic retrograde cholangiopancreatography       with biliary stone and stent placed to distal biliary duct.  2. Klebsiella sepsis secondary to cholangitis.  3. Acute renal failure secondary to Klebsiella sepsis secondary to      cholangitis.  4. Atrial fibrillation.  5. Anemia.  6. Mitral valve replacement with St. Jude valve secondary to mitral      stenosis in 1997.  7. Cardiac catheterization July 2008 revealing normal coronary      arteries.  8. Mild pulmonary hypertension.  9. Dyslipidemia.  10.Mild chronic lower extremity edema.  11.Prior hyperthyroidism treated with Thionamide treatment.  12.Gastroesophageal reflux disease.   HOSPITAL COURSE:  This is a very pleasant 55 year old Caucasian female  who was seen by Dr. Daleen Squibb in the Coumadin Clinic for routine INR check  secondary to history of bowel replacement and atrial fibrillation.  While in the clinic, the patient doubled over in pain holding her  stomach saying that it hurt on her left side.  The  patient became  progressively hypotensive and weak and was brought emergently to [facility] where she was admitted to the Intensive Care Unit.  The  patient was admitted to rule out acute bleed or mesenteric thrombosis in  the setting of atrial fibrillation and chronic Coumadin.  The patient  was found to be mildly hypotensive on admission.  She had multiple labs  drawn to include amylase, lipase, also CBCs, and comprehensive metabolic  profile.  The patient had acute renal failure noted which became  progressive beginning February 25th with a creatinine of 2.92 with  subsequent increase in creatinine level over the next several days to  include greater than 3.  The patient also went into respiratory failure  and was intubated for a short period of time secondary to sepsis.  A GI  consult was had and Dr. Matthias Hughs and his group saw the patient.  He  did  abdominal films and ERCP with findings of bile duct stone.  The patient  had stones removed and was found to have Klebsiella in the bile duct and  subsequent sepsis as a result.  The patient was placed on multiple  antibiotics secondary to pain.  She was also followed by Critical Care  Medicine for treatment of sepsis.  She was extubated approximately 3  days after intubation.  The septic shock with Klebsiella bacteremia was  being successfully treated.  She was placed on Cipro, piperacillin, and  had slow improvement.  The patient also had an episode of atrial  fibrillation with RVR  and was started on a Cardizem drip briefly.  The  patient's Digoxin was stopped secondary to renal failure.  The patient's  LFTs did improve secondary to the stent placement and removal of stone.  The patient had slow recovery requiring multiple antibiotics and  intensive care admission.   The patient was moved to the stepdown unit on August 25, 2007 with marked  improvement in symptoms.  The patient was up walking around.  She  continued on  antibiotics.  Her blood pressure remained stable.  Her  potassium was 3.5 and she was given potassium replacement.  The  patient's Coumadin was held secondary to anemia and did require blood  transfusion with a hemoglobin of 8.  BUN and creatinine continued to  improve on stepdown with __________ strong each day to monitor.   The patient was started p.o. Ceftin and IV antibiotics were  discontinued.  The patient was feeling much better, a lot more  energetic, and was walking up and down in the hall on day of discharge.  The patient was seen and examined by Dr. Juanito Doom on day of discharge.  She had received 2 units of packed red blood cells the day before and  hemoglobin and hematocrit have marked improvement with a hemoglobin of  9.6.  The patient was very anxious to go home.  She will be sent home on  Ceftin 250 mg p.o. twice a day for 3 days.  We will hold her Digoxin and  HCTZ for now.  The patient will be placed on potassium each day and will  continue her Simvastatin at Dr. Vern Claude discretion as LFTs improve.  She  will see Dr. Daleen Squibb within 1 week with more labs drawn to evaluate status.   LABS ON DAY OF DISCHARGE:  Sodium 136, potassium 4.0, chloride 108, CO2  22, glucose 99, BUN 18, creatinine 2.12.  Hemoglobin 9.6, hematocrit  28.0, white blood cells 11.1, platelets 246.  PT 32.8, INR 3.0.  Last  hepatic function panel dated August 25, 2007: Bilirubin 2.7, direct  bilirubin 1.6, indirect bilirubin 1.1, alkaline phosphatase 92, SGOT 21,  SGPT 22, total protein 5.8, albumin 2.0.  Chest x-ray on August 22, 2007  revealed no evidence of bilateral or pulmonary opacities. EKG on  discharge: Atrial fibrillation with ventricular rate of 58 beats per  minute.   DISCHARGE MEDICATIONS:  1. Ceftin 250 mg p.o. twice a day for 3 days.  2. Coumadin 2.5 mg daily.  3. Potassium 20 mEq every a.m.  4. Xopenex 45 mcg inhaler every 6 hours.  5. Pepcid 20 mg at bedtime.  6. Ferrous sulfate 325 mg 3  times a day.  7. Simvastatin 40 mg at bedtime (to be restarted at Dr. Vern Claude      discretion).   FOLLOWUP PLANS/APPOINTMENTS:  1. The patient is to follow up with  Dr. Madilyn Fireman at [facility] for      continued evaluation of known cholangitis and biliary dilatation      with stent on September 04, 2007 at 1:45.  2. The patient is to followup with Dr. Juanito Doom on September 03, 2007 with      time to be scheduled.  Our office will call with that appointment.  3. The patient is to not take her Digoxin and HCTZ.  This has been      explained to the patient.  4. The patient is to followup with the primary care physician for      continued medical management.   Time spent with the patient to include physician time 45 minutes.      Bettey Mare. Lyman Bishop, NP      Jesse Sans. Daleen Squibb, MD, [facility]  Electronically Signed    KML/MEDQ  D:  08/29/2007  T:  08/29/2007  Job:  [id number]

## 2010-11-05 NOTE — Assessment & Plan Note (Signed)
[facility]                            CARDIOLOGY OFFICE NOTE   NAME:[name], [name]                     MRN:          [id number]  DATE:03/23/2008                            DOB:          22-Aug-1955    Ms. [name] returns today for followup.   PROBLEM LIST:  1. History of rheumatic heart disease status post St. Jude mitral      valve replacement.  2. Chronic atrial fibrillation with rate control and anticoagulation   She has no cardiac complaints today.  She denies any palpitations,  presyncope, or syncope.   Her heart rate today is in the 45-50 range.  The rest of her EKG is  unchanged.   CURRENT MEDICATIONS:  1. Coumadin 5 mg as directed.  2. Iron 45 mg t.i.d.  3. Simvastatin 40 mg nightly.  4. Metoprolol extended release 50 mg a day.  5. Digoxin 0.25 mg per day.   PHYSICAL EXAMINATION:  VITAL SIGNS:  Today her blood pressure is 128/70,  her pulse is 50 and irregular.  Weight is 175.  GENERAL:  She looks remarkably good.  HEENT:  Normal.  NECK:  Carotids are full without bruits.  No JVD.  Thyroid is not  enlarged.  Trachea is midline.  LUNGS:  Clear to auscultation and percussion.  HEART:  Reveals a prosthetic S1.  Irregular rate and rhythm.  ABDOMEN:  Soft, good bowel sounds.  No midline bruit.  EXTREMITIES:  No cyanosis, clubbing, or edema.  Pulses are intact.  NEUROLOGIC:  Intact.   [name] is doing well.  She is a little bit slow, though she is  asymptomatic.  We will decrease her metoprolol to 25 mg q.a.m.   We will also check fasting lipids, LFTs have been recently checked by  Dr. Madilyn Fireman, they were normal.  We will also check a digoxin level.   I have reviewed outside records including a CT scan with [name].  She had a right lower lobe nodule that had been stable since February.  Repeat in 12 months was requested.    Thomas C. Daleen Squibb, MD, [facility]  Electronically Signed   TCW/MedQ  DD: 03/23/2008  DT: 03/23/2008  Job #:  [id number]

## 2010-11-05 NOTE — H&P (Signed)
NAMEFRANCESKA, STRAHM              ACCOUNT NO.:  [id number]   MEDICAL RECORD NO.:  [id number]          PATIENT TYPE:  INP   LOCATION:  1823                         FACILITY:  [facility]   PHYSICIAN:  Bernette Redbird, M.D.   DATE OF BIRTH:  Dec 08, 1955   DATE OF ADMISSION:  09/09/2007  DATE OF DISCHARGE:                              HISTORY & PHYSICAL   REASON FOR ADMISSION:  Ms. Bensman is being admitted to the hospital  because of abdominal pain, elevation of liver chemistries and concerns  of possible cholangitis.   HISTORY OF PRESENT ILLNESS:  Ms. Palmisano was discharged from this  hospital approximately 2 weeks ago, following a stormy, several-week  hospitalization characterized by septic shock, ICU stay with pressors  and mechanical ventilation, which was attributed to Klebsiella  bacteremia most likely from cholangitis.   The patient is 6 years status post laparoscopic cholecystectomy,  complicated by common duct stones at that time which took several ERCPs  to get clear doubt.  On her last hospitalization, there was a prodrome  of some upper abdominal pain and she was actually in the hospital for a  couple of days before she crashed with septic shock.   She underwent an emergent ERCP under general anesthesia on that  admission, during which time sweeps of the bile duct with a balloon  brought out some sludge debris and the dirty-appearing bile, but no  discrete stones were seen.  A stent was placed, and the patient improved  clinically.   With all that background, the patient had been convalescing nicely at  home, getting stronger, without too much appetite, until yesterday  evening at 7 p.m., when she had severe epigastric pain radiating under  her ribs on both sides into the back, associated with a sweat, but no  fevers, no shaking chills and no nausea.  These symptoms are very  reminiscent to what she had prior to her last admission.  The symptoms  lasted several minutes and  then subsided.  She decided to wait it out  and see what happened, but around 10 o'clock last night, she had a  similar episode and it was decided to come to the emergency room.  Blood  work in the emergency room showed mild leukocytosis and recurrent  elevation of her liver chemistries and thus repeat hospitalization was  felt to be needed.   At this time, the patient is quite comfortable.   PAST MEDICAL HISTORY:   ALLERGIES:  No known allergies.   OUTPATIENT MEDICATIONS:  1. Warfarin (Coumadin) 2.5 mg four days a week of 5 mg three days a      week.  2. Hydrochlorothiazide (dose unknown, not listed as a discharge      medication at the time of her last hospitalization).  3. Digoxin (not sure of dose, thinks it is 0.25 mg, was not listed as      a discharge medication on last hospitalization).  4. Vytorin 10/10 once daily.  5. Toprol 50 mg daily (this was started recently by Dr. Daleen Squibb as an      outpatient).  6. Keflex  500 mg started several days ago by her primary physician,      Dr. Vear Clock, for sinusitis.   OPERATIONS:  1. The above-mentioned laparoscopic cholecystectomy in 2003.  2. St. Jude mitral valve replacement in 1997.   MEDICAL ILLNESSES:  1. History of the mitral valve replacement with chronic atrial      fibrillation and an enlarged left atrium, maintained on chronic      anticoagulation.  2. History of mild pulmonary hypertension.  3. History of hyperlipidemia.  4. History of hypertension.   HABITS:  Nonsmoker and nondrinker.   FAMILY HISTORY:  Gallbladder disease in her sister.  Negative for colon  cancer.  Her grandmother had colitis and had to have a bag ([facility]  present with colostomy), also heart disease in numerous relatives.   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  She has not worked  since her previous hospitalization about a month ago.  She is divorced  and ordinarily lives  by herself, but since her last illness, she has  been staying with her parents.   REVIEW OF SYSTEMS:  As mentioned above, the patient has not had much of  an appetite since her last hospitalization although she is not frankly  nauseated.  She has had somewhat loose stools recently, probably 5 in  the last 24 hours.  She has not seen any rectal bleeding.  No problem  with chest pain, trouble breathing or lower extremity edema recently.   PHYSICAL EXAM:  GENERAL:  At this time, Natalyah is lying on stretcher in  the ER, in no acute distress.  She has been up all night and looks a  little bit tired, but really apart from that, is in good spirits and  does not really appear ill.  VITAL SIGNS:  Most recent vital signs include temperature 97.2 orally  (she has been afebrile throughout the night since arrival), blood  pressure 109/74, pulse 86, respirations 20 and unlabored.  O2 SAT 95% on  room air.  HEENT:  She is anicteric and without overt pallor.  CHEST:  Clear to auscultation, no rales or wheezes appreciated.  HEART: Irregularly irregular rhythm consistent with atrial fibrillation.  Mitral valve heart sounds are crisp and I do not hear any pathologic or  significant murmurs.  A soft flow murmur may be present.  ABDOMEN:  Somewhat adipose.  Active bowel sounds are present.  Remarkably nontender, no organomegaly, guarding or masses.  EXTREMITIES:  No lower extremity edema.   LABORATORY DATA:  White count 12,900 with 87 polys and 9 lymphs,  hemoglobin 12.7, platelets 223,000.  Chemistry panel pertinent for  glucose 128, essentially normal renal function with BUN 17 and  creatinine 1.4, total bilirubin 1.9, alkaline phosphatase 140, AST 141,  ALT 75, albumin 3.6.  Putting this in perspective, the patient's  discharge white count approximately 2 weeks ago was 11,000, bilirubin  was 2.7, AST was 21, ALT was 22, so the main difference has been a mild  rise in white count and a moderate bump  in her transaminases.  Note that  lipase is elevated at 78 and I do not see a previous one for comparison.   IMPRESSION:  1. Recurrent abdominal pain with recurrent elevation of transaminases      and mild worsening of leukocytosis.  2. Recent Klebsiella bacteremia and probable septic cholangitis      culminating in septic shock.  3. Status  post laparoscopic cholecystectomy and endoscopic retrograde      cholangiopancreatogram with sphincterotomy and common duct stone      extraction 6 years ago, with recent stent placement a month ago, at      which time no obvious stones were seen, but there was debris in the      bile duct.  4. Cardiac issues including mitral valve replacement with chronic      anticoagulation, chronic atrial fibrillation, hypertension,      hyperlipidemia, pulmonary hypertension and markedly enlarged left      atrium per Dr. Daleen Squibb.   I am concerned this may be incipient recurrent cholangitis, although  liver abscess, pancreatitis and C. difficile infection are also on the  list of possibilities.   PLAN:  1. Admission to telemetry.  Cardiology will follow closely.  I have      been in telephone contact with Dr. Daleen Squibb, who recommends keeping the      patient anticoagulated to the degree possible because she is at      high risk for throwing a clot, holding her antihypertensives, but      continuing the Toprol for rate control (will hold digoxin for now).  2. CT of abdomen to look for pancreatitis or liver abscess.  3. Blood cultures followed by antibiotic therapy.  4. Probable early ERCP, perhaps later today or tomorrow, depending on      the patient's clinical evolution and the findings of the CT scan.  5. Check stool for C. difficile.             ______________________________  Bernette Redbird, M.D.     RB/MEDQ  D:  09/09/2007  T:  09/09/2007  Job:  [id number]   cc:   Thomas C. Daleen Squibb, MD, [facility]  [place]

## 2010-11-05 NOTE — Cardiovascular Report (Signed)
[name], [name]              ACCOUNT NO.:  [id number]   MEDICAL RECORD NO.:  [id number]          PATIENT TYPE:  INP   LOCATION:  2019                         FACILITY:  [facility]   PHYSICIAN:  Rollene Rotunda, MD, FACCDATE OF BIRTH:  1955-08-09   DATE OF PROCEDURE:  12/28/2006  DATE OF DISCHARGE:                            CARDIAC CATHETERIZATION   PROCEDURE:  Coronary arteriogram.   INDICATIONS:  Evaluate patient with chest pain suggestive of unstable  angina.  She had abnormal EKG with T-wave changes.   PROCEDURE NOTE:  Coronary angiography was performed via the right  femoral artery.  The vessel was cannulated using and anterior wall  puncture.  A #6-French arterial sheath was inserted via the modified  Seldinger technique. Performed Judkins catheters were utilized.  The  patient tolerated procedure well and left the lab in stable condition.   RESULTS:   HEMODYNAMICS:  AO 128/67.   CORONARIES:  The left main was normal.   The LAD was normal.  First diagonal was moderate size and normal.  The  second diagonal was small and normal.   Circumflex in the AV groove was normal.  There was an obtuse marginal  which was large and normal.  There was a posterolateral which was large  and normal.   Right coronary artery is dominant and normal.   Left ventriculogram:  The left ventricle was not injected.  I did do  fluoroscopy from all angles to visualize the mechanical mitral valve.  This seemed to be functioning well and was a normally placed, normal-  appearing St. Jude valve.   CONCLUSION:  Normal coronary arteries.   PLAN:  No further cardiovascular workup is suggested.  The etiology of  chest discomfort is unclear.  The patient does not have any obvious  valve abnormalities by fluoroscopy.  She will be placed back on heparin.  She will need to be on anticoagulation, either heparin or Lovenox, until  her INR is above 2.5.  We will arrange this through our Coumadin  clinic.      Rollene Rotunda, MD, [facility]  Electronically Signed     JH/MEDQ  D:  12/28/2006  T:  12/28/2006  Job:  [id number]   cc:   [place]

## 2010-11-08 NOTE — H&P (Signed)
NAMEKELA, BACCARI                        ACCOUNT NO.:  [id number]   MEDICAL RECORD NO.:  [id number]                   PATIENT TYPE:  OIB   LOCATION:  2860                                 FACILITY:  [facility]   PHYSICIAN:  Althea Grimmer. Luther Parody, M.D.            DATE OF BIRTH:  February 19, 1956   DATE OF ADMISSION:  05/30/2002  DATE OF DISCHARGE:                                HISTORY & PHYSICAL   HISTORY:  The patient is a 55 year old female hospitalized in late October  for choledocholithiasis and biliary obstruction. She underwent incomplete  clearance of the common bile duct at ERCP due to a very large stone. Her  gallbladder was removed and she ended up going home with a stent in place in  the common bile duct. She has been on Actigall 300 mg twice daily and  returns to the hospital today for follow-up ERCP and clearance of her common  bile duct. We had intended to remove the stent and see if the stone was  still present and not extend her sphincterotomy or do any potentially  traumatic manipulations to the duct since she is anticoagulated for a St.  Jude mitral valve. However, her prothrombin time on presentation to the GI  unit was 14.5 with an INR of 1.1 despite taking Coumadin 10 mg per day.  Thus, when the stone was noted in the common bile duct the sphincterotomy  was extended and the stone removed with a web extraction basket. The  situation was discussed with Dr. Chales Abrahams of [facility] who felt she  should be admitted for overnight heparinization and increased  Coumadinization, which I do not feel should be problematic with the minimal  extension of her sphincterotomy. There was no bleeding noted at the time of  the procedure.   On physical exam postprocedure she is alert, in no acute distress, with  clear chest, irregularly irregular heart sounds with a clicking valve. Her  abdomen is mildly distended with air, but she has bowel sounds and is  nontender.   IMPRESSION:  1. Status post ERCP with sphincterotomy extension and successful removal of     common bile duct stone.  2. Atrial fibrillation.  3. St. Jude mitral valve.    PLAN:  The patient is admitted overnight for heparinization, observation,  increased coumadinization and clearance for discharge by cardiologist.  Please see the orders.                                               Althea Grimmer. Luther Parody, M.D.    PJS/MEDQ  D:  05/30/2002  T:  05/30/2002  Job:  [id number]   cc:   Thomas C. Wall, M.D. [facility]  [address]  [place]  [place] [address]  Fax: [phone]   Donnie Coffin. Price,  M.D.  [address]  [place]  [place] [address]  Fax: [phone]
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
## 2010-11-08 NOTE — Discharge Summary (Signed)
NAMEARNOLD, DEPINTO              ACCOUNT NO.:  [id number]   MEDICAL RECORD NO.:  [id number]          PATIENT TYPE:  INP   LOCATION:  3705                         FACILITY:  [facility]   PHYSICIAN:  Jesse Sans. Wall, M.D.   DATE OF BIRTH:  Nov 13, 1955   DATE OF ADMISSION:  03/12/2005  DATE OF DISCHARGE:  03/14/2005                                 DISCHARGE SUMMARY   PROCEDURE:  CT.   DISCHARGE DIAGNOSES:  1.  Epigastric/abdominal pain, probably muscle strain as well as possible      gastroesophageal reflux disease.  2.  Anticoagulation with Coumadin, Lovenox started this admission and      continued at discharge with 48-hour cross-cover to a therapeutic      Coumadin level.  3.  Status post mitral valve replacement in 1997 with St. Jude.  4.  History of hyperthyroidism with thyroid function tests within normal      limits this admission.  5.  Chronic atrial fibrillation, failed direct current cardioversion.  6.  Hypertension.  7.  Hyperlipidemia.  8.  Moderate pulmonary hypertension.  9.  Status post cholecystectomy and neck surgery.   HOSPITAL COURSE:  [name] is a 55 year old female who had abdominal pain  at work and came to the hospital.  She was admitted for further evaluation  and treatment.   She did not have an acute abdomen by exam.  She was not severely anemic.  A  CT was performed which showed trace biliary gas without intra- or  extrahepatic ductal dilatation.  No acute intra-abdominal findings.  There  was a 2.2 cm hypodense mass in the region of the left adnexa which could  represent an ovarian follicle.  There was no pelvic free fluid or  lymphadenopathy seen.  There was no acute intrapelvic pathology.  She was  evaluated by Dr. Tenny Craw who felt that she most likely had either reflux  symptoms or abdominal strain with coughing.  No further work-up for  abdominal pain was indicated and her symptoms were improving rapidly.   Ms. Patient is on chronic anticoagulation  because of atrial fibrillation and  a mitral valve.  Her INR is 2.5.  Her INR was 1.0 on admission.  She was  started on Lovenox subcu and is to continue this after discharge.  She is to  take Coumadin and follow up with the Coumadin clinic as an outpatient.  Dr.  Daleen Squibb recommended 48-hour overlap therapeutic Coumadin with Lovenox.   Ms. Vandekamp was able to obtain the Lovenox.  She has an appointment with the  Coumadin clinic scheduled on Monday.  She also has a history of  hypothyroidism and thyroid function tests were checked this admission.  The  TSH was within normal limits at 0.491 and then the free T4 and T3 uptake  were within normal limits at 1.33 and 33.5%, respectively.  Dr. George Hugh  office was contacted and advised that she did not need to be on PTU at this  time and he would evaluate her in the office and start any medications  indicated.  A lipid profile was also performed  which showed a total  cholesterol of 196, triglycerides 69, HDL 52, LDL 130.  She is encouraged to  continue the Vytorin and follow up in the office.   Ms. Welliver was evaluated by Dr. Daleen Squibb and considered stable for discharge on  March 14, 2005, with outpatient follow-up arranged.   DISCHARGE INSTRUCTIONS:  1.  Her activity level is to be increased gradually.  2.  She is to follow up at the Coumadin clinic on Monday at 3:45.  3.  She is to stick to a low fat diet.  4.  She is to follow up with Dr. Daleen Squibb on April 24, 2005, at 4:15 and with      Dr. Everardo All on March 17, 2005, at 11 a.m.   DISCHARGE MEDICATIONS:  1.  Coumadin 5 mg two tablets Friday, Saturday and Sunday and Coumadin check      on Monday.  2.  Hydrochlorothiazide 12.5 mg daily.  3.  Vytorin 10/40 daily.  4.  Toprol XL 50 mg daily.  5.  Digoxin 0.25 mg daily.  6.  Prilosec OTC daily.  7.  Lovenox 80 mg subcu q.12h.      Theodore Demark, P.A. [facility]      Thomas C. Wall, M.D.  Electronically Signed    RB/MEDQ  D:   03/14/2005  T:  03/16/2005  Job:  [id number]   cc:   Paula Libra, MD  Fax: [phone]   Sean A. Everardo All, M.D. [facility]  [address]  [place]  [place] [address]   [place]  Fax: [phone]

## 2010-11-08 NOTE — Op Note (Signed)
[name], PICKERAL                        ACCOUNT NO.:  [id number]   MEDICAL RECORD NO.:  [id number]                   PATIENT TYPE:  INP   LOCATION:  2550                                 FACILITY:  [facility]   PHYSICIAN:  Althea Grimmer. Luther Parody, M.D.            DATE OF BIRTH:  April 23, 1956   DATE OF PROCEDURE:  DATE OF DISCHARGE:                                 OPERATIVE REPORT   PROCEDURE:  Endoscopic retrograde cholangiopancreatography with  sphincterotomy and stone extraction.   INDICATIONS:  A 55 year old female with obstructive jaundice, with stones in  the gallbladder and in the common bile duct seen on ultrasonogram.  She also  has an enlarged common bile duct.  The patient is chronically anticoagulated  due to artificial heart valve.  Her Coumadin has been held and  anticoagulation reversed with vitamin K.  She is on Unasyn antibiotics.  The  procedure was explained to the patient in detail, including the potential  risks of bleeding, perforation, or pancreatitis.  She agreed to proceed.   PREMEDICATIONS:  Preprocedure sedation prior to and during the entire  procedure:  She received 200 mg of Demerol and 20 mg of Versed  intravenously.  In addition, her throat was anesthetized with Cetacaine  spray and she was given 0.5 mg of Glucagon IV.   DESCRIPTION OF PROCEDURE:  The Olympus video duodenoscope was inserted via  the mouth and advanced into the duodenum.  The ampulla was localized.  The  preloaded sphincterotome was advanced through the endoscope and easily  entered the ampulla, and initial injection into the pancreatic duct was  minimal.  After considerable additional attempts at relocating the lumen of  the duct, the common bile duct was visualized with dye but the guidewire  could not be easily advanced.  A tapered-tip sphincterotome was used in an  attempt to gain access to the common bile duct, but this was not successful  and I switched back to the sphincterotome, at  which point entry to the  common bile duct was attained.  A guidewire was advanced high into the  intrahepatic biliary tree and the common bile duct was filled with dye,  easily outlining a medium-sized stone in the middle of the duct.  A moderate  sphincterotomy was performed without bleeding.  A 12 mm inflatable balloon  catheter was inserted above the stone and inflated while dye was injected  from above.  The catheter was drawn through the sphincterotomy multiple  times.  Although the stone was not clearly seen to be in the duodenal lumen,  after the final attempt it could not be found in the duct with the  radiologist in attendance, and we presume it shattered and was pulled  through.  The patient tolerated the procedure well.  Pulse, blood pressure,  and oximetry testing remained stable throughout.  She was observed in  recovery for two hours, alert, with a benign abdomen and  without complaints  of abdominal pain.   IMPRESSION:  Successful endoscopic retrograde cholangiopancreatography,  sphincterotomy, and stone extraction.    PLAN:  The patient will be re-anticoagulated later this evening with heparin  and contemplation of laparoscopic cholecystectomy is being given for  tomorrow.                                               Althea Grimmer. Luther Parody, M.D.    PJS/MEDQ  D:  04/07/2002  T:  04/09/2002  Job:  [id number]   cc:   Sandria Bales. Ezzard Standing, MD  [address], [address]  [place]  [place] [address]  Fax: [phone]   [place]. Daphine Deutscher, M.D.  Fax: [phone]   Jesse Sans. Wall, M.D. [facility]

## 2010-11-08 NOTE — Consult Note (Signed)
[facility]                            ENDOCRINOLOGY CONSULTATION   NAME:[name], [name]                     MRN:          [id number]  DATE:03/23/2006                            DOB:          [birth year]    REASON FOR VISIT:  Follow up thyroid.   HISTORY OF PRESENT ILLNESS:  A 55 year old woman with a history of  hyperthyroidism previously treated with thionamide medication.  She has been  off this for about 18 months.  She states she feels no different, and well  in general except for chronic intermittent palpitations.   PAST MEDICAL HISTORY:  1. Atrial fibrillation.  2. Dyslipidemia.  3. GERD.   REVIEW OF SYSTEMS:  Denies fever.   PHYSICAL EXAMINATION:  Blood pressure is 117/74.  Heart rate is 80.  Temperature is 97.5.  The weight is 182.  GENERAL:  No distress.  NECK:  I do not appreciate a goiter.  SKIN:  Not diaphoretic.  NEUROLOGIC:  No tremor.  She does not appear anxious nor depressed.   LABORATORY STUDIES:  On March 23, 2006, TSH 0.56.   IMPRESSION:  History of hyperthyroidism.  She has a significant chance of  recurrence, given that she has been off her thionamide medication for 18  months now.  It is important to continue to watch her in view of her atrial  fibrillation.   PLAN:  1. Continue to stay off thyroid medication.  2. Return in 6 months, or sooner if you feel there has been a change in      your thyroid status.           ______________________________  Cleophas Dunker Everardo All, MD    SAE/MedQ  DD:  03/24/2006  DT:  03/25/2006  Job #:  [id number]   cc:   Loma Sender

## 2010-11-08 NOTE — Discharge Summary (Signed)
[name], [name]                        ACCOUNT NO.:  [id number]   MEDICAL RECORD NO.:  [id number]                   PATIENT TYPE:  INP   LOCATION:  4730                                 FACILITY:  [facility]   PHYSICIAN:  Maisie Fus B. Samuella Cota, M.D.               DATE OF BIRTH:  [birth year]   DATE OF ADMISSION:  04/05/2002  DATE OF DISCHARGE:  04/25/2002                                 DISCHARGE SUMMARY   CHIEF COMPLAINT:  Abdominal pain for five days with jaundice.   HISTORY OF PRESENT ILLNESS:  This 56 year old white female presented at the  emergency room with an ultrasound report from [facility] showing gallstones and common bile duct obstruction secondary to an 8  mm stone.  She had right upper quadrant pain and had been treated for  pneumonia.  She was admitted by [place]. Daphine Deutscher, M.D., for further  evaluation and treatment.   ALLERGIES:  No known drug allergies.   PAST SURGICAL HISTORY:  1. Mitral valve replacement with a St. Jude's valve in 1997 by Kerin Perna, M.D.  2. Cervical laminectomy.   MEDICATIONS:  1. Coumadin 15 mg on Monday and Thursday and 10 mg on Tuesday, Wednesday,     Friday, Saturday, and Sunday.  2. Pravachol 40 mg daily.  3. Percocet p.r.n.  4. Lanoxin 0.25 mg per day.  5. Toprol XL 50 mg per day.  6. Propylthiouracil either 50 or 150 mg per day.   REVIEW OF SYMPTOMS:  Otherwise unremarkable.   PHYSICAL EXAMINATION:  GENERAL APPEARANCE:  A jaundiced female in no acute  distress.  HEENT:  The conjunctivae were icteric.  NECK:  Scars from cervical disk surgery.  CHEST:  Clear.  HEART:  A click from the mitral valve replacement.  ABDOMEN:  Tender mildly in the right upper quadrant.   IMPRESSION:  Chronic cholecystitis with cholelithiasis and  choledocholithiasis with secondary jaundice.   HOSPITAL COURSE:  The patient was seen in consultation by Fayrene Fearing L. Randa Evens,  M.D., and cardiology consult was also  obtained.  The patient was given 1 mg  of vitamin K subcutaneously and the Coumadin was stopped.  When the INR got  below 2.5, the plan was to start heparin and then stop the heparin prior to  a preoperatively ERCP.  The patient had ERCP by Althea Grimmer. Luther Parody, M.D., on  April 07, 2002.  She underwent a sphincterotomy and although no stones  were retrieved, it was thought that the common bile duct had been cleared.  The patient was taken to the operating room on April 08, 2002, at which  time I did a laparoscopic cholecystectomy with operative cholangiogram.  She  was found to have a large cystic duct with a retained common duct stone.  There was no passage of contrast into the duodenum.  The patient was seen  that same day  by Bernette Redbird, M.D., who felt it best to take the patient  for a repeat ERCP and to least place a stent at that time.  The patient  underwent an ERCP on April 08, 2002, by Dr. Matthias Hughs.  He found a  moderately large clot adherent to the site of the sphincterotomy.  A stent  was placed.  The patient was maintained on heparin because of heart valve.  The patient had a fair amount of ileus, but was passing flatus on April 11, 2002.  The patient was slowly placed back on a diet, although she was  not particularly tender.  The patient went back for ERCP on April 13, 2002, by Althea Grimmer. Santogade, M.D., at which time the stent was removed and a  12 mm balloon was passed several times through the common duct and there  seemed to be no evidence of any retained stones.  The patient tolerated that  well.  Initially her liver function tests were returning towards normal.  However, her bilirubin rose again to 8.0 with elevated SGOT, SGPT, and  alkaline phosphatase.  She was quite jaundiced clinically.  It was felt that  she still had a common bile duct stone, so she was taken again for ERCP on  April 18, 2002, by Althea Grimmer. Luther Parody, M.D.  The patient was on Coumadin   at the time, so a stent was placed and it was noted that she had a least one  retained common duct stone.  It was felt best to leave the stent for perhaps  six weeks and then take the patient back for repeat ERCP and repeat  sphincterotomy if necessary.   The patient was followed very closely during her hospital stay by the  cardiologist.  She was slow to get her Coumadin back up to a good INR.  On  April 25, 2002, her INR was 2.4.  It was felt the patient could be  discharged home to have her prothrombin time rechecked in two days by  [facility].  She was to restart her preoperative medication.   DISCHARGE MEDICATIONS:  1. Coumadin 10 mg daily.  2. Pravachol 40 mg at night.  3. Lanoxin 0.25 mg per day.  4. Toprol XL 50 mg a day.  5. Resume her propylthiouracil.   LABORATORY DATA:  The patient had numerous studies while in the hospital.  Chest x-ray showed mild cardiomegaly with no evidence of acute disease.  ERCP on April 07, 2002, showed final films revealed satisfactory emptying  of the common bile duct without definite retained stones.  ERCP on April 18, 2002, showed satisfactory position of the stent with probable blood in  the biliary duct system involving both the common hepatic duct and common  bile duct.  Calculi cannot be excluded.  ERCP on April 13, 2002, showed no  definite retained common duct calculi on completion of the exam.  ERCP on  April 08, 2002, showed placement of biliary stent.  There is a single  filling defect estimated to measure approximately 1 cm in diameter likely in  the common hepatic duct.  Laboratory data showed admitting biliary of 2.9.  She had numerous CBCs with her last CBC on April 24, 2002, showing a  hemoglobin of 9.7, hematocrit 29.9, and white count 8800.  Her total  bilirubin reached as high as 10.1 and her last total bilirubin on April 24, 2002, was 2.9.  TSH was 0.141 on April 06, 2002, with a  free T4 of  1.71.    FINAL DIAGNOSIS:  Chronic cholecystitis with cholelithiasis with  choledocholithiasis.   OPERATIONS:  1. On April 07, 2002, endoscopic retrograde cholangiopancreatography by     Althea Grimmer. Luther Parody, M.D.  2. On April 08, 2002, laparoscopic cholecystectomy with intraoperative     cholangiogram by Maisie Fus B. Samuella Cota, M.D.  3. On April 08, 2002, endoscopic retrograde cholangiopancreatography with     placement of a stent by Bernette Redbird, M.D.  4. On April 13, 2002, endoscopic retrograde cholangiopancreatography by     Althea Grimmer. Santogade, M.D., with removal of stent and passage of balloon     catheter.  5. On April 18, 2002, endoscopic retrograde cholangiopancreatography by     Althea Grimmer. Santogade, M.D., with placement of biliary stent.   CONDITION ON DISCHARGE:  Improved.                                               Thomas B. Samuella Cota, M.D.    TBP/MEDQ  D:  05/23/2002  T:  05/23/2002  Job:  [id number]   cc:   Loma Sender  [address]  [place]  [place] [address]  Fax: [phone]   Jesse Sans. Wall, M.D. [facility]  [address]  [place]  [place] [address]  Fax: [phone]   Sean A. Everardo All, M.D. [facility]   Althea Grimmer. Luther Parody, M.D.  [address], [address]  [place]  [place] [address]  Fax: [phone]   Bernette Redbird, M.D.  [address]  [place], [place] [address]  Fax: [phone]

## 2010-11-08 NOTE — Consult Note (Signed)
[facility]                          ENDOCRINOLOGY CONSULTATION   NAME:[name], TATANISHA CUTHBERT                     MRN:          [id number]  DATE:09/14/2006                            DOB:          1955/09/21    REASON FOR VISIT:  Follow-up thyroid.   HISTORY OF PRESENT ILLNESS:  This 55 year old woman has now been off  antithyroid medication for 2 years.  She states she feels no different,  and well in general except for slight weight gain.   PAST MEDICAL HISTORY:  1. Atrial fibrillation.  2. Dyslipidemia.  3. GERD.   REVIEW OF SYSTEMS:  Denies fever.   PHYSICAL EXAMINATION:  VITAL SIGNS: Blood pressure 145/85, heart rate  80, temperature is 97.8. The weight is 190.  GENERAL: In no distress. Thyroid is normal. Skin not diaphoretic.  Neurologic no tremor, does not appear anxious or depressed.   LABORATORY STUDIES:  On September 14, 2006: TSH normal at 0.63.   IMPRESSION:  History of hyperthyroidism, now euthyroid despite being off  antithyroid medication for about 2 years.   PLAN:  1. Continue to stay off antithyroid medication.  2. Return in a year, or sooner if necessary.     Sean A. Everardo All, MD  Electronically Signed    SAE/MedQ  DD: 09/15/2006  DT: 09/15/2006  Job #: [id number]   cc:   Loma Sender

## 2010-11-08 NOTE — Op Note (Signed)
[name], [name]                        ACCOUNT NO.:  [id number]   MEDICAL RECORD NO.:  [id number]                   PATIENT TYPE:  OIB   LOCATION:  5527                                 FACILITY:  [facility]   PHYSICIAN:  Althea Grimmer. Luther Parody, M.D.            DATE OF BIRTH:  13-Aug-1955   DATE OF PROCEDURE:  05/30/2002  DATE OF DISCHARGE:                                 OPERATIVE REPORT   PROCEDURE PERFORMED:  Video endoscopic retrograde cholangiopancreatography  with sphincterotomy extension and common bile duct stone extraction.   ENDOSCOPIST:  Althea Grimmer. Luther Parody, M.D.   INDICATIONS FOR PROCEDURE:  Retained common bile duct stone status post  cholecystectomy and prior attempt at endoscopic removal of common bile duct  stone.   PREPARATION:  She was NPO since midnight.  Her Coumadin has been held for 24  hours only.  He received 100 mg of gentamicin and 2 grams of ampicillin IV  prior to the procedure.   PREPROCEDURE EXAMINATION:  HEENT:  Anicteric sclerae.  Normal oropharynx.  CHEST:  Clear.  HEART:  Sounds irregularly irregular with valve clicking.  ABDOMEN:  Soft without mass, tenderness or organomegaly.   PREPROCEDURE SEDATION:  Demerol 100 mg IV, Versed 14 mg IV and Cetacaine  spray times two to the oropharynx, and Glucagon 0.75 mg IV given during the  procedure.   DESCRIPTION OF PROCEDURE:  The Olympus video duodenoscope, therapeutic size,  was introduced through the mouth and advanced easily through the upper  esophageal sphincter.  Intubation was carried out to the duodenal apex. The  scope was retroflexed and pulled back to visualize the ampulla.  The  preexisting stent was easily visualized, grasped with a snare and extracted  through the scope easily.  A preloaded sphincterotomy was introduced through  the prior sphincterotomy and the common bile duct was easily visualized with  dye.  There was an apparent stone 8-10 mm size in the mid to distal common  bile  duct.  A 12 mm balloon catheter exchange was performed and the balloon  was placed above the stone and with two attempts it could not be pulled  through the common bile duct.  Since a prothrombin time revealed that the  patient was not clinically anticoagulated despite taking Coumadin the  sphincterotomy was mildly extended and another attempt to retrieve the stone  was made, similarly unsuccessful.  A Conquest web extraction basket was  passed into the common bile duct after removal of the balloon catheter.  The  stone was grasped within the basket and fragmented.  After multiple attempts  one large stone fragment was removed successfully and multiple small  fragments with subsequent passage of an 8 mm balloon.  These fragments were  seen to be within the duodenum lumen.  The radiologist entered the room and  agreed that the common bile duct was clear.   The patient tolerated the procedure well.  Pulse, blood  pressure and  oximetry testing remained stable throughout.  She will be observed in  recovery and after discussion with the cardiologist admitted for overnight  observation and improved anticoagulation, which should not be a problem due  to the minimal extension of the sphincterotomy and no bleeding noted during  the procedure.   IMPRESSION:  Successful endoscopic retrograde cholangiopancreatography,  sphincterotomy extension and stone removal.   PLAN:  1. Admission for observation, heparinization overnight, reinstitution of     Coumadin at a higher dose and subsequent dose of ampicillin for bacterial     endocarditis prophylaxis.  2. Please see the orders and the admission note.                                                 Althea Grimmer. Luther Parody, M.D.    PJS/MEDQ  D:  05/30/2002  T:  05/30/2002  Job:  [id number]   cc:   Thomas B. Samuella Cota, M.D.  [address]  [place]  [place] [address]  Fax: [phone]   Jesse Sans. Wall, M.D. [facility]  [address]  [place]   [place] [address]  Fax: 1

## 2010-11-08 NOTE — Consult Note (Signed)
[name], [name]                        ACCOUNT NO.:  [id number]   MEDICAL RECORD NO.:  [id number]                   PATIENT TYPE:  INP   LOCATION:  3705                                 FACILITY:  [facility]   PHYSICIAN:  James L. Malon Kindle., M.D.          DATE OF BIRTH:  14-Jul-1955   DATE OF CONSULTATION:  04/05/2002  DATE OF DISCHARGE:                                   CONSULTATION   REASON FOR CONSULTATION:  Jaundice and abdominal pain.   HISTORY:  A very nice 55 year old white female, who has had approximately  four days of abdominal pain.  She came to the emergency room.  There was  some question of having a right-sided pneumonia because of an infiltrate,  saw Dr. Vear Clock, who ordered gallbladder ultrasound which was done in  [place].  This revealed the gallbladder to be distended with a calculi  within the gallbladder lumen.  It was 1 cm in diameter.  Common duct  measured 1 cm in diameter with an 8 mm stone in the common bile duct.  We do  not have those films.  This is by report.  The pancreas and spleen were  normal.  Due to the patient's known heart disease, she was brought over here  for admission and probable surgery.  She notes that she has having this  right upper quadrant pain for 4-5 days with nausea.   CURRENT MEDICATIONS:  Coumadin 15 mg alternating with 10 mg, Pravachol,  Percocet, Lanoxin, Toprol, PTU.   MEDICAL HISTORY:  1. History of mitral valve replacement with a St. Jude valve in 1997,     followed by Dr. Juanito Doom.  2. History of hyperthyroidism, currently being treated with PTU.  3. History of hypercholesterolemia.  4. Her surgeries include the mitral valve replacement in 1997 as well as a     cervical laminectomy.  She has never had any abdominal surgery.   FAMILY HISTORY:  Noncontributory.   PHYSICAL EXAMINATION:  VITAL SIGNS:  The patient is afebrile, vital signs  normal.  GENERAL:  She is alert, talkative, white female.  EYES:  Sclerae  icteric.  NECK:  Supple.  No lymphadenopathy.  LUNGS:  Clear.  HEART:  There is a mechanical click.  ABDOMEN:  Reveals very mild tenderness in the right upper quadrant.  No  guarding, rigidity, or rebound.  Bowel sounds are present.   PERTINENT LABORATORY DATA:  White count 8.4, hemoglobin 12.2, platelet count  308,000, albumin 3.2, GOT 85, GPT 79, alkaline phosphatase 152, total  bilirubin 5.9, amylase 29, pro time 47 seconds for an INR of 7.5.   ASSESSMENT:  1. Jaundice, gallstones, and probable common duct stone - She certainly will     need a cholecystectomy and probably a preoperative ERCP.  We are unable     to do this at this point in time due to excessive anticoagulation.  2. Status post mitral valve replacement.  She is currently anticoagulated.   PLAN:  We will follow her along with you.  Cardiology to see about the  anticoagulation.  She likely will need to have this reversed and placed on  the heparin drip.  When this is achieved, will likely need a preoperative  ERCP followed by an empiric cholecystectomy.  In the interim, we will keep  her on antibiotics and follow her labs.  If she improves and we are able to  get her coagulation controlled, we will go ahead at that time with an ERCP.                                               James L. Malon Kindle., M.D.    Waldron Session  D:  04/05/2002  T:  04/06/2002  Job:  [id number]   cc:   [place] Daphine Deutscher, M.D.  Fax: [phone]   Jesse Sans. Daleen Squibb, M.D. [facility]

## 2010-11-08 NOTE — Op Note (Signed)
NAMELINDZEY, ZENT                        ACCOUNT NO.:  [id number]   MEDICAL RECORD NO.:  [id number]                   PATIENT TYPE:  INP   LOCATION:  4730                                 FACILITY:  [facility]   PHYSICIAN:  Althea Grimmer. Luther Parody, M.D.            DATE OF BIRTH:  1956/01/23   DATE OF PROCEDURE:  DATE OF DISCHARGE:                                 OPERATIVE REPORT   PROCEDURE PERFORMED:  Endoscopic retrograde cholangiopancreatography with  sphincterotomy.   INDICATIONS FOR PROCEDURE:  Recurrently obstructed common bile duct in a 55-  year-old female status post laparoscopic cholecystectomy for stone disease.  Her bilirubin reached 10 today.   PREPARATION:  She was n.p.o. since midnight.   PREPROCEDURE MEDICATIONS:  She received a total of 110 mg of Demerol and 12  mg of Versed intravenously.  In addition, her throat was anesthetized with  Hurricaine spray.   DESCRIPTION OF PROCEDURE:  The Olympus video duodenoscope was inserted via  the mouth and advanced fairly easily into the duodenum.  The ampulla and  sphincterotomy site were located.  Through the duodenoscope, the adjustable  balloon catheter was passed into the sphincterotomy opening.  It could not  be advanced up the common bile duct; however, the guidewire passed the  distal point of obstruction and was fed high into a left intrahepatic duct.  Still the balloon catheter could not be advanced, so it was withdrawn,  maintaining the wire in position while a 7 mm 10 French stent was made ready  for passage over the wire.  This was accomplished and the stent was placed  in good position with the distal flange just at the sphincterotomy opening.  Through the stent, dye was injected.  The common hepatic duct area did not  fill well but we did not get good occlusion and could not generate much  pressure to fill the duct.  There appeared to be a small stone adjacent to  the stent within the distal common bile duct.   Films were reviewed with Dr.  Maple Hudson, radiologist in attendance who agreed.  Because the patient was fully  anticoagulated at this time, it was not appropriate to proceed with  sphincterotomy extension or other interventions due to the risk of bleeding.  In addition at the start of the procedure, there was noted to be a clot  within the sphincterotomy and perhaps some of the obstruction to her common  bile duct was related to this.  The patient tolerated the procedure well.  Pulse, blood pressure and oximetry testing remained stable throughout.  She  will be observed in recovery for one hour and then discharged back to her  hospital room.  Please see the orders.   PLAN:  I will begin her on Unasyn due to mildly elevated white blood cell  today and the risk of cholangitis.  Prior to the procedure she had also  received ampicillin and gentamicin  endocarditis prophylaxis.  Her Coumadin  will be held until we are certain that her liver function tests are  normalizing.  If the stent continues to drain well as it appeared to be  doing both endoscopically and radiographically, and her liver function tests  normalize, consideration  will be given to leaving the stent in place for approximately six weeks  before removing it and attempting to definitively clear the duct.  In  addition, consideration will be given to placing her on ursodeoxycholic acid  to help dissolve the cholesterol stones.  Please see the orders.                                               Althea Grimmer. Luther Parody, M.D.    PJS/MEDQ  D:  04/18/2002  T:  04/19/2002  Job:  [id number]   cc:   Maisie Fus B. Samuella Cota, M.D.  Fax: [phone]   Jesse Sans. Wall, M.D. [facility]

## 2010-11-08 NOTE — H&P (Signed)
NAMEEUSTOLIA, DRENNEN NO.:  [id number]   MEDICAL RECORD NO.:  [id number]          PATIENT TYPE:  INP   LOCATION:  3705                         FACILITY:  [facility]   PHYSICIAN:  Arvilla Meres, M.D. LHCDATE OF BIRTH:  11/30/1955   DATE OF ADMISSION:  03/12/2005  DATE OF DISCHARGE:                                HISTORY & PHYSICAL   BRIEF HISTORY:  Mr. Harman is a 55 year old white female who presents to the  emergency room complaining of abdominal discomfort.  She stated that around  10:15 this morning while at work as she was walking she suddenly developed  some stinging needle-like pain in her upper left lower extremity.  This  lasted about 45 minutes.  However, around 11:25 while sitting at desk she  suddenly developed an epigastric sharp pain radiating into her left side  associated with dripping diaphoresis.  She felt faint, short of breath, and  nauseated and felt that her heart beat was going slightly fast.  She did not  have any associated chest discomfort or vomiting.  She has never had any  prior occurrences.  She went to the [facility]; however,  they transferred her to [facility] for further evaluation.   PAST MEDICAL HISTORY:  No known drug allergies.   MEDICATIONS PRIOR TO ADMISSION:  Unknown as she does not have a list with  her.  I have asked her ask her boyfriend to please bring in these to the  hospital for review.   PAST MEDICAL HISTORY:  1.  Notable for hyperthyroidism with treatment of PTU in the past.  2.  Status post mitral valve repair with [employer] in 1997 secondary to      rheumatic heart disease.  3.  She has chronic atrial fibrillation with a failed DC cardioversion in      1997.  4.  She is on anticoagulation with Coumadin that is monitored at the [facility].  Last INR available per e-chart was January 23, 2005 at 4.6.  5.  She has a history of hypertension.  6.   Hyperlipidemia.  7.  Moderate pulmonary hypertension.  8.  Status post cholecystectomy.  9.  Status post cervical laminectomy.   SOCIAL HISTORY:  She resides in [place] with her boyfriend.  She is  divorced.  She has no children.  She is a Geologist, engineering with handicapped  students.  She has never smoked.  She denies any alcohol or drugs.  Her  mother is alive in her 61s.  She thinks she may have heart problems.  She is  not sure.  She has a history of hypertension.  Her father is alive and well  at the age of 36.  She has two brothers, two sisters that are alive and  well.   REVIEW OF SYSTEMS:  Notable for a 5-10 pound weight gain over the last year,  glasses, occasional pedal edema which resolves at night, postmenopausal with  a last menstrual cycle two years ago, lower extremity weakness, and  generalized fatigue, rare GERD.   PHYSICAL EXAMINATION:  GENERAL:  Well-nourished, well-developed, pleasant  white female in no apparent distress.  VITAL SIGNS:  Blood pressure is 141//99 and now 115/66, pulse is 110 and  irregular and now 96, respirations 20, 99% saturations on room air.  HEENT:  Unremarkable.  NECK:  Supple without thyromegaly, adenopathy, JVD, or carotid bruits.  HEART:  Slightly irregular with prosthetic valve sounds.  Do not appreciate  any murmurs.  All pulses are symmetrical and intact without femoral or  abdominal bruits.  CHEST:  Symmetrical excursion.  Lungs were clear to auscultation.  SKIN/INTEGUMENT:  Intact without rashes or lesions.  ABDOMEN:  Bowel sounds present, soft with slight epigastric tenderness.  She  has fist-sized masses palpable in the epigastric region.  EXTREMITIES:  Negative cyanosis, clubbing, or edema.  MUSCULOSKELETAL:  Unremarkable.  NEUROLOGIC:  Unremarkable.   EKG in the emergency room shows atrial fibrillation with a ventricular rate  of 100, normal axis, insignificant inferior Q-waves, nonspecific ST-T wave  changes.  Old EKGs  are not available for comparison.  Chest x-ray and  laboratories are pending.   IMPRESSION:  Abdominal discomfort of uncertain etiology.  Does not appear to  have any active cardiac issues.  History as previously.   DISPOSITION:  We will admit for observation, rule out myocardial infarction.  Check amylase, lipase, PT, INR, and the usual laboratories.  We will obtain  abdominal/pelvic CT with contrast to evaluate for etiology to her  discomfort.  I have called the office to obtain office records in regards to  her recent visits, last echocardiogram, catheterization report, and her  medications and most recent INR.  As of her time of admission these records  have not arrived.      Joellyn Rued, P.A. [facility]      Arvilla Meres, M.D. [facility]  Electronically Signed    EW/MEDQ  D:  03/13/2005  T:  03/13/2005  Job:  [id number]   cc:   Loma Sender  Fax: [phone]   Sean A. Everardo All, M.D. [facility]  [address]  [place]  [place] [address]   Jesse Sans. Wall, M.D.  [address]  [place]  [place] [address]

## 2010-11-08 NOTE — Op Note (Signed)
NAMEMCKINLEE, DUNK                        ACCOUNT NO.:  [id number]   MEDICAL RECORD NO.:  [id number]                   PATIENT TYPE:  INP   LOCATION:  4730                                 FACILITY:  [facility]   PHYSICIAN:  Bernette Redbird, MD                  DATE OF BIRTH:  [birth year]   DATE OF PROCEDURE:  04/08/2002  DATE OF DISCHARGE:                                 OPERATIVE REPORT   PROCEDURE:  ERCP with stent placement.   INDICATIONS FOR PROCEDURE:  A 55 year old female status post mitral valve  replacement five years ago on chronic anticoagulation, who presented with  jaundice and gallstones several days ago. She had her anticoagulation  reversed, underwent an ERCP, sphincterotomy, and balloon pullthrough  yesterday (no stone visibly delivered), but post procedure cholangiogram  clear, with good drainage, and then underwent a laparoscopic cholecystectomy  today by Donnie Coffin. Price, M.D., during which an intraoperative cholangiogram  was obtained which showed a medium size stone in the common duct and no  drainage.   FINDINGS:  Adherent clot at the previous sphincterotomy site.  Medium size  stone present in the common duct.  10 French x 7 cm stent placed.   DESCRIPTION OF PROCEDURE:  The nature, purpose, and risks of the procedure  were discussed carefully with the patient's father, who provided written  consent on the patient's behalf since she was postoperative and somewhat  groggy, although, I did discuss the procedure with her in general terms and  she was agreeable also to proceed.   The patient was taken from the PACU to radiology, already having received  Unasyn earlier in the day, and received intravenous sedation totaling  fentanyl 70 mcg and Versed 7 mg IV as well as Glucagon 0.5 mg IV to reduce  duodenal contractility.  There was no problem with arrhythmias or  desaturation during the procedure.   The Olympus duodenoscope was passed blindly into the esophagus  without  significant difficulty.  The stomach was slightly inflamed in the antral  region, but no obvious ulcers were observed, and the duodenum was rapidly  entered and the major papilla was identified.  Note that the patient had  this procedure done in the left lateral decubitus position because she was  still somewhat uncomfortable from her laparoscopic incision sites.   The miniature papilla was covered by a medium size clot, probably measuring  about 2 cm across.  There was no bile evident in the duodenal lumen,  consistent with the absence of drainage noted at intraoperative  cholangiography.  By probing with the sphincterotome, I was able to advance  up the common duct and insert a wire which freely and deeply allowed  selective cannulation of the common duct.  Contrast was injected and  demonstrated a medium size (probably 8 to 10 mm) stone in the midportion of  the common duct, which was somewhat dilated, whereas  the more distal portion  of the duct was less dilated.   Given the presence of the clot at the sphincterotomy site, it was not felt  to be prudent to try to extend the sphincterotomy or even for that matter to  do a lumen pullthrough or other traumatizing maneuvers.  So a 7 cm x 10  French stent was placed using the Oasis system, leaving a good portion of  the stent external in the duodenal lumen, and with the proximal end of the  distal well up in the midportion of the common bile duct. There was visible  bile drainage from the distal end of the stent after placement.  Its  position was confirmed radiographically with a spot film. The endoscope was  then removed from the patient who tolerated the procedure well and without  apparent complications.    IMPRESSION:  1. Clotted gastrosphincterotomy site, which is of some concern in view of     the need to reheparinize this patient.  2. Common duct stone, not removed.  3. Functional or anatomic biliary obstruction,  stented as described above.   PLAN:  Rollene Rotunda, M.D. [facility], Ollen Gross. Carolynne Edouard, M.D., and  I discussed the  patient's condition by telephone.  Basically, Dr. Antoine Poche and I feel that  the potential for a CVA in this patient, if left off heparin for a sustained  period of time, would be more devastating to the patient than the potential  for a GI bleed which could be probably managed fairly effectively with  supportive care.  Accordingly, we will plan to go with the original plan as  per Dr. Samuella Cota, to reinitiate heparin anticoagulation approximately 12 hours  following the completion of her cholecystectomy.   Because the patient is at increased risk for GI bleeding when her heparin is  restarted, we will plan to transfer the patient to a stepdown or ICU bed  rather than a regular floor bed, and I am requesting frequent blood work and  frequent vital signs for the next several days.   I have discussed with the patient's family, including her parents and  sister, the circumstances in which we find ourselves, and they seem to have  a good understanding.                                                 Bernette Redbird, MD    RB/MEDQ  D:  04/08/2002  T:  04/10/2002  Job:  [id number]   cc:   Graciella Freer Samuella Cota, M.D.  Fax: [phone]   Jesse Sans. Wall, M.D. [facility]

## 2010-11-08 NOTE — Op Note (Signed)
[name], [name]                        ACCOUNT NO.:  [id number]   MEDICAL RECORD NO.:  [id number]                   PATIENT TYPE:  INP   LOCATION:  4730                                 FACILITY:  [facility]   PHYSICIAN:  Maisie Fus B. Samuella Cota, M.D.               DATE OF BIRTH:  06-30-1955   DATE OF PROCEDURE:  04/08/2002  DATE OF DISCHARGE:                                 OPERATIVE REPORT   PREOPERATIVE DIAGNOSES:  Acute cholecystitis with cholelithiasis with recent  ERCP, sphincterotomy for common duct stones.   POSTOPERATIVE DIAGNOSES:  Acute cholecystitis with cholelithiasis with  recent ERCP, sphincterotomy for common duct stones.  Retained common duct  stones.   OPERATION/PROCEDURE:  Laparoscopic cholecystectomy with operative  cholangiogram.   SURGEON:  Maisie Fus B. Samuella Cota, M.D.   ASSISTANT:  Gabrielle Dare. Janee Morn, M.D.   ANESTHESIA:  General, Dr. Katrinka Blazing and CRNA.   DESCRIPTION OF PROCEDURE:  The patient was taken to the operating room,  placed on the table in supine position. After satisfactory general  anesthetic with intubation, the entire abdomen was prepped and draped in a  sterile field. The patient had a palpable mass easily felt in the right  upper quadrant of the abdomen. A small vertical infraumbilical incision was  made through skin, subcutaneous tissue, and midline fascia. A finger was  placed into the peritoneal cavity and there were no adhesions. A pursestring  suture of #0 Vicryl was placed  and the Hasson trocar placed into the  abdomen and the abdomen insufflated to 14 mmHg pressure. A second 10 mm  trocar was placed just to the right of midline in the subxiphoid area. Two 5  mm trocars were placed laterally. The markedly distended gallbladder was  decompressed using a needle and syringe. About 100 cc of dark bilious fluid  was aspirated. It did not have an odor but it was sent for culture and  sensitivity. The gallbladder wall was quite thickened. The patient's  liver  looked quite abnormal and there was a small whitish looking lesion of the  edge of the right lobe of the liver. Pictures were taken of the liver and of  this lesion in the right lobe of the liver but unfortunately these were  never developed and were erased at the end of the case. The gallbladder was  placed on traction and it was very tedious dissection and a very large  cystic duct was identified. The duct was too large to be clamped with  hemoclips. A small opening was made into the cystic duct close to the  gallbladder. Through this was placed a [employer] which had been  introduced through the abdominal wall. The cholangiocath was placed into the  cystic duct and held with endoclips. Using real-time C-arm fluoroscopy, x-  rays were taken which revealed a very dilated common duct with a large round  stone present in the distal duct with some  dye distal to this but no  contrast ever went out of the common duct into the small bowel. We waited  several minutes to see if the contrast would go in the stomach but it would  not. It was elected to not try to explore the common duct through the cystic  duct. The patient had a sphincterotomy and a stone retrieval yesterday. The  cystic duct was dissected somewhat further because we knew we had more  length on the cystic duct after doing the cholangiogram. The duct was too  large to clip so two #0 PDS ligatures were placed on the cystic duct to get  a good tight closure. The gallbladder was then tediously dissected away,  small arteries were doubly clipped during the dissection. The gallbladder  bed was irrigated, hemostasis was obtained but because the patient will be  going on heparin postop, two pieces of Surgicel were placed into the  gallbladder fossa. The gallbladder was placed in an endocatch bag and then  was delivered through the infraumbilical incision where it was removed  without difficulty. The pursestring suture at the  infraumbilical incision  was tied to get good closure. A 0.25% Marcaine without epinephrine was  injected at the infraumbilical site and also had been injected at the other  sites previously. The fluid in the right upper quadrant of the abdomen was  copiously irrigated with saline and irrigated. The fluid was suctioned out  and the two lateral trocars were removed under direct vision. The abdomen  was deflated and a second 20 mm trocar was removed. The two midline  incisions were closed with running subcuticular sutures of 4-0 Vicryl and  the two lateral trocar sites were closed with single simple inverted 4-0  Vicryl. Benzoin and 1/2 inch Steri-Strips were used to reinforce the skin  closure. Dry sterile dressings were applied. The patient seemed to tolerate  the procedure well and was taken to the PACU in satisfactory condition.                                                 Thomas B. Samuella Cota, M.D.    TBP/MEDQ  D:  04/08/2002  T:  04/10/2002  Job:  [id number]   cc:   Thomas C. Daleen Squibb, M.D. [facility]   Althea Grimmer. Luther Parody, M.D.  [address]  [place]  [place] [address]  Fax: [phone]   Bernette Redbird, MD  [address]  [place], [place] [address]  Fax: [phone]

## 2010-11-08 NOTE — H&P (Signed)
[place]  Patient:    [name], [name] Visit Number: [id number] MRN: [id number]          Service Type: Attending:  Patricia Nettle, M.D. Dictated by:   Clarene Reamer, P.A. Adm. Date:  12/21/01                           History and Physical  CHIEF COMPLAINT:  Neck pain and left shoulder and left arm pain.  HISTORY OF PRESENT ILLNESS:  The patient is a 55 year old female, who sustained a fall at work and twisted her body trying to break her fall.  She had immediate pain in the mid thoracic area between the scapula.  This originally happened on June 09, 2001.  She has subsequently been followed by Dr. Noel Gerold, for which she has had increasing neck pain and left upper extremity pain.  She states today that she feels like her neck and left upper extremity pain is worsening.  She describes the pain in her left shoulder and left arm in a C5 and C6 distribution.  She also notes some weakness about the wrist including her grip.  She goes on to complain about having left arm pain and burning about the shoulder and upper left arm.  At this time she wishes to proceed with surgical measures.  Risks and benefits of surgery have been discussed with the patient and Dr. Noel Gerold and she feel it is okay to proceed with surgery.  PAST MEDICAL HISTORY:  1. Atrial fibrillation.  2. Gastroesophageal reflux disease.  PAST SURGICAL HISTORY:  Mitral valve replacement in 1997.  MEDICATIONS:  1. Pravachol 40 mg one p.o. q.d.  2. Lanoxin 2.5 mg one p.o. q.d.  3. Toprol-XL 50 mg one p.o. q.d.  4. Propylthiouracil 50 mg one p.o. q.d.  5. Coumadin 5 mg one p.o. q.d.  She has been instructed to stop this before     surgery.  ALLERGIES:  No known drug allergies.  SOCIAL HISTORY:  She denies any tobacco or alcohol use.  She is divorced. 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  She lives in a [place] house with four stairs in the front.  Her father will  be her caregiver after surgery.  FAMILY HISTORY:  Mother, coronary artery disease and hypertension.  Father, unremarkable.  Grandfather, diabetes mellitus.  REVIEW OF SYSTEMS:  GENERAL: Denies fever, chills, night sweats, bleeding tendencies.  CNS: Denies blurred or double vision, seizures, headaches, paralysis.  RESPIRATORY: Denies shortness of breath, productive cough, hemoptysis.  CV: Positive chest pain.  Denies angina, orthopnea.  GI: Denies nausea, vomiting, diarrhea, constipation, melena, or bloody stools.  GU: Denies dysuria, hematuria, or discharge.  MUSCULOSKELETAL: Pertinent to HPI.  PHYSICAL EXAMINATION:  VITAL SIGNS:  BP 130/90, pulse 68, respirations 12.  GENERAL:  Well-developed, well-nourished 55 year old female.  HEENT:  Normocephalic, atraumatic.  PERRL.  NECK:  Supple.  No carotid bruits.  Positive Spurlings maneuver with left upper extremity pain.  CHEST:  Clear to auscultation bilaterally.  No wheezes or crackles.  HEART:  Irregularly irregular rhythm.  No murmurs, rubs, or gallops.  She has a systolic click of her artificial mitral valve.  ABDOMEN:  Soft, nontender, nondistended.  Positive bowel sounds x4 quadrants.  EXTREMITIES:  Has giveaway weakness of left wrist and grip.  Decreased sensation in C6 pattern.  Decreased deep tendon reflexes in biceps, brachial radialis.  Negative Hoffmanns sign.  SKIN:  No rashes  or lesions.  LABORATORY DATA:  X-ray of cervical spine revealed arthritic changes of the facets as well as some early degenerative changes and spurring of the anterior bodies, but no subluxation and no evidence of severe disk degeneration.  IMPRESSION:  1. Herniated nucleus pulposus at C5-6.  2. Atrial fibrillation.  3. Gastroesophageal reflux disease.  PLAN:  Anterior cervical diskectomy and fusion, C5-6, with possible ACDF of C4-5. Dictated by:   Clarene Reamer, P.A. Attending:  Patricia Nettle, M.D. DD:  12/15/01 TD:  12/16/01 Job:  [id number]

## 2010-11-08 NOTE — Op Note (Signed)
[name], [name]                        ACCOUNT NO.:  [id number]   MEDICAL RECORD NO.:  [id number]                   PATIENT TYPE:  INP   LOCATION:  4730                                 FACILITY:  [facility]   PHYSICIAN:  Althea Grimmer. Luther Parody, M.D.            DATE OF BIRTH:  11/30/1955   DATE OF PROCEDURE:  04/13/2002  DATE OF DISCHARGE:                                 OPERATIVE REPORT   PROCEDURE:  Endoscopic retrograde cholangiopancreatography.   INDICATIONS:  Reported persisting common bile duct stone with stent in place  in the common bile duct, status post laparoscopic cholecystectomy.   PREPROCEDURE PREPARATION:  The patient is NPO since clear liquid breakfast.  She has been on Cefazolin IV.   PREMEDICATION:  The throat was anesthetized with Cetacaine spray.  Prior to  and during the procedure, she received a total of 130 mg of Demerol, 13 mg  of Versed, and 0.5 mg of Glucagon intravenously.   DESCRIPTION OF PROCEDURE:  The Olympus video duodenoscope was inserted via  the mouth and advanced easily into the stomach, thence through the pylorus  and into the second duodenum.  The ampulla with the stent protruding was  easily visualized.  The stent was grasped through the snare and withdrawn  through the scope.  A 12 mm balloon catheter was inserted through the  spincterotomy easily and advanced up to the bifurcation of the common bile  duct into the left and right ducts.  The balloon was inflated and withdrawn  as dye was injected.  This procedure was repeated three times with no  obvious stone found and possibly minimal debris delivered to the duodenum.  The radiologist was consulted, and Glenn T. Fredia Sorrow, M.D., came into the  procedure room.  The balloon catheter was passed two more times to the  bifurcation, inflated, and withdrawn as dye was injected, and he concurred  that no stone could be identified.  The duct was draining extremely well,  and the sphincterotomy was  adequate enough to allow the 12 mm balloon to be  pulled through without deflating.   IMPRESSION:  Successful endoscopic retrograde cholangiopancreatography with  radiographic clearance of the common bile duct.  The possibilities are that  the stone pulverized with the stent and spontaneously passed or  spontaneously passed intact or that possibly the filling defect was a clot  and not a true stone.  I doubt that there is any remaining stone material  within the biliary system.    PLAN:  The patient will be fed.  She may be re-anticoagulated and  coumadinized as per cardiology.  Liver function tests should be followed.  If they are not promptly resolving, we will reassess.  If her biliary tree  is draining properly, there should be no need for further antibiotics at  this time.  Althea Grimmer. Luther Parody, M.D.    PJS/MEDQ  D:  04/13/2002  T:  04/14/2002  Job:  [id number]   cc:   [place] Daphine Deutscher, M.D.  Fax: [phone]   Donnie Coffin. Samuella Cota, M.D.  Fax: [phone]

## 2010-11-21 ENCOUNTER — Ambulatory Visit (INDEPENDENT_AMBULATORY_CARE_PROVIDER_SITE_OTHER): Payer: BC Managed Care – PPO | Admitting: *Deleted

## 2010-11-21 DIAGNOSIS — I059 Rheumatic mitral valve disease, unspecified: Secondary | ICD-10-CM

## 2010-11-21 DIAGNOSIS — I4891 Unspecified atrial fibrillation: Secondary | ICD-10-CM

## 2010-11-21 DIAGNOSIS — Z9889 Other specified postprocedural states: Secondary | ICD-10-CM

## 2010-12-16 ENCOUNTER — Other Ambulatory Visit (INDEPENDENT_AMBULATORY_CARE_PROVIDER_SITE_OTHER): Payer: BC Managed Care – PPO | Admitting: *Deleted

## 2010-12-16 DIAGNOSIS — E785 Hyperlipidemia, unspecified: Secondary | ICD-10-CM

## 2010-12-16 LAB — HEPATIC FUNCTION PANEL
Albumin: 3.7 g/dL (ref 3.5–5.2)
Total Bilirubin: 1 mg/dL (ref 0.3–1.2)

## 2010-12-16 LAB — LIPID PANEL
HDL: 32.4 mg/dL — ABNORMAL LOW (ref >39.00)
LDL Cholesterol: 94 mg/dL (ref 0–99)
Total CHOL/HDL Ratio: 5
Triglycerides: 123 mg/dL (ref 0.0–149.0)

## 2010-12-17 ENCOUNTER — Telehealth: Payer: Self-pay | Admitting: *Deleted

## 2010-12-17 NOTE — Telephone Encounter (Signed)
Spoke with Johnny Bridge at [facility].  Pt did have polyps removed today.  To help decrease risk of bleeding, will wait and start Lovenox on 6/27 in PM.  Will continue Lovenox 80mg  twice daily.  Restart Coumadin with 1 1/2 tablets today and tomorrow the resume 1 tablet daily.  Pt has appt to recheck INR on 7/2.

## 2010-12-17 NOTE — Telephone Encounter (Signed)
Per Johnny Bridge from [facility], the patient has had her procedure this morning with them. Per Dr. Daleen Squibb, patient should restart anticoags immediately after procedure. Per last coumadin note, the patient has lovenox pre & post procedure, but no written directions in her chart. I have spoken to [facility]- pharmacist in [facility]. I will forward this message to her so she can review. They are holding the patient at [facility] until they hear back from [place], since the patient is not quite certain what her post procedure directions were.

## 2010-12-23 ENCOUNTER — Ambulatory Visit (INDEPENDENT_AMBULATORY_CARE_PROVIDER_SITE_OTHER): Payer: BC Managed Care – PPO | Admitting: *Deleted

## 2010-12-23 DIAGNOSIS — Z9889 Other specified postprocedural states: Secondary | ICD-10-CM

## 2010-12-23 DIAGNOSIS — I059 Rheumatic mitral valve disease, unspecified: Secondary | ICD-10-CM

## 2010-12-23 DIAGNOSIS — I4891 Unspecified atrial fibrillation: Secondary | ICD-10-CM

## 2011-01-15 ENCOUNTER — Encounter: Payer: Self-pay | Admitting: Cardiology

## 2011-01-20 ENCOUNTER — Ambulatory Visit (INDEPENDENT_AMBULATORY_CARE_PROVIDER_SITE_OTHER): Payer: BC Managed Care – PPO | Admitting: *Deleted

## 2011-01-20 DIAGNOSIS — I059 Rheumatic mitral valve disease, unspecified: Secondary | ICD-10-CM

## 2011-01-20 DIAGNOSIS — Z9889 Other specified postprocedural states: Secondary | ICD-10-CM

## 2011-01-20 DIAGNOSIS — I4891 Unspecified atrial fibrillation: Secondary | ICD-10-CM

## 2011-02-04 ENCOUNTER — Other Ambulatory Visit: Payer: Self-pay | Admitting: Cardiology

## 2011-02-14 ENCOUNTER — Ambulatory Visit (INDEPENDENT_AMBULATORY_CARE_PROVIDER_SITE_OTHER): Payer: BC Managed Care – PPO | Admitting: *Deleted

## 2011-02-14 DIAGNOSIS — I4891 Unspecified atrial fibrillation: Secondary | ICD-10-CM

## 2011-02-14 DIAGNOSIS — I059 Rheumatic mitral valve disease, unspecified: Secondary | ICD-10-CM

## 2011-02-14 DIAGNOSIS — Z9889 Other specified postprocedural states: Secondary | ICD-10-CM

## 2011-02-14 LAB — POCT INR: INR: 4

## 2011-03-07 ENCOUNTER — Ambulatory Visit (INDEPENDENT_AMBULATORY_CARE_PROVIDER_SITE_OTHER): Payer: BC Managed Care – PPO | Admitting: *Deleted

## 2011-03-07 DIAGNOSIS — I059 Rheumatic mitral valve disease, unspecified: Secondary | ICD-10-CM

## 2011-03-07 DIAGNOSIS — I4891 Unspecified atrial fibrillation: Secondary | ICD-10-CM

## 2011-03-07 DIAGNOSIS — Z9889 Other specified postprocedural states: Secondary | ICD-10-CM

## 2011-03-07 LAB — POCT INR: INR: 3.8

## 2011-03-14 LAB — COMPREHENSIVE METABOLIC PANEL
ALT: 41 — ABNORMAL HIGH
ALT: 54 — ABNORMAL HIGH
AST: 136 — ABNORMAL HIGH
AST: 69 — ABNORMAL HIGH
Albumin: 2.3 — ABNORMAL LOW
Albumin: 2.3 — ABNORMAL LOW
Albumin: 4
Alkaline Phosphatase: 56
Alkaline Phosphatase: 56
BUN: 31 — ABNORMAL HIGH
BUN: 38 — ABNORMAL HIGH
CO2: 21
Calcium: 6.8 — ABNORMAL LOW
Calcium: 6.9 — ABNORMAL LOW
Calcium: 9.1
Chloride: 102
Creatinine, Ser: 0.86
GFR calc Af Amer: 18 — ABNORMAL LOW
GFR calc Af Amer: >60
GFR calc non Af Amer: 14 — ABNORMAL LOW
Glucose, Bld: 102 — ABNORMAL HIGH
Potassium: 3.5
Potassium: 3.7
Sodium: 130 — ABNORMAL LOW
Sodium: 134 — ABNORMAL LOW
Sodium: 138
Total Bilirubin: 5.9 — ABNORMAL HIGH
Total Protein: 5.5 — ABNORMAL LOW
Total Protein: 5.7 — ABNORMAL LOW
Total Protein: 7.8

## 2011-03-14 LAB — CBC
HCT: 24.5 — ABNORMAL LOW
HCT: 27.2 — ABNORMAL LOW
HCT: 30.9 — ABNORMAL LOW
HCT: 32.6 — ABNORMAL LOW
HCT: 34.4 — ABNORMAL LOW
Hemoglobin: 10.9 — ABNORMAL LOW
Hemoglobin: 11.7 — ABNORMAL LOW
Hemoglobin: 8.4 — ABNORMAL LOW
MCHC: 32.6
MCHC: 33.4
MCHC: 33.8
MCHC: 34
MCV: 86.3
MCV: 86.4
Platelets: 252
Platelets: 74 — ABNORMAL LOW
Platelets: 95 — ABNORMAL LOW
RBC: 2.94 — ABNORMAL LOW
RBC: 3.04 — ABNORMAL LOW
RBC: 3.18 — ABNORMAL LOW
RBC: 3.58 — ABNORMAL LOW
RDW: 15
RDW: 15.4
WBC: 13.9 — ABNORMAL HIGH
WBC: 17.6 — ABNORMAL HIGH
WBC: 26.9 — ABNORMAL HIGH
WBC: 38.5 — ABNORMAL HIGH
WBC: 9.6

## 2011-03-14 LAB — BLOOD GAS, ARTERIAL
Acid-base deficit: 9 — ABNORMAL HIGH
Acid-base deficit: 9 — ABNORMAL HIGH
Bicarbonate: 15.8 — ABNORMAL LOW
Bicarbonate: 19.7 — ABNORMAL LOW
Drawn by: 273391
FIO2: 100
MECHVT: 500
PEEP: 5
Patient temperature: 101.3
Patient temperature: 98.6
TCO2: 16.8
TCO2: 20.7
pCO2 arterial: 27.1 — ABNORMAL LOW
pCO2 arterial: 31.2 — ABNORMAL LOW
pCO2 arterial: 34.4 — ABNORMAL LOW
pH, Arterial: 7.325 — ABNORMAL LOW
pH, Arterial: 7.385
pO2, Arterial: 177 — ABNORMAL HIGH
pO2, Arterial: 268 — ABNORMAL HIGH

## 2011-03-14 LAB — CULTURE, BLOOD (ROUTINE X 2)
Culture: NO GROWTH
Culture: NO GROWTH

## 2011-03-14 LAB — URINE MICROSCOPIC-ADD ON

## 2011-03-14 LAB — TROPONIN I: Troponin I: 0.65

## 2011-03-14 LAB — DIFFERENTIAL
Basophils Absolute: 0
Eosinophils Relative: 0
Eosinophils Relative: 0
Lymphocytes Relative: 2 — ABNORMAL LOW
Lymphocytes Relative: 5 — ABNORMAL LOW
Lymphs Abs: 0.8
Lymphs Abs: 0.9
Monocytes Absolute: 0.6
Monocytes Relative: 2 — ABNORMAL LOW
Monocytes Relative: 3
Neutro Abs: 36.9 — ABNORMAL HIGH

## 2011-03-14 LAB — BASIC METABOLIC PANEL
CO2: 19
Calcium: 6.6 — ABNORMAL LOW
Calcium: 7.4 — ABNORMAL LOW
Creatinine, Ser: 3.6 — ABNORMAL HIGH
GFR calc Af Amer: 15 — ABNORMAL LOW
GFR calc Af Amer: 16 — ABNORMAL LOW
GFR calc non Af Amer: 12 — ABNORMAL LOW
GFR calc non Af Amer: 13 — ABNORMAL LOW
GFR calc non Af Amer: 13 — ABNORMAL LOW
Glucose, Bld: 90
Potassium: 2.8 — ABNORMAL LOW
Potassium: 4.7
Sodium: 130 — ABNORMAL LOW
Sodium: 132 — ABNORMAL LOW
Sodium: 138

## 2011-03-14 LAB — PROTIME-INR
INR: 2.1 — ABNORMAL HIGH
INR: 2.3 — ABNORMAL HIGH
INR: 3 — ABNORMAL HIGH
Prothrombin Time: 25.6 — ABNORMAL HIGH
Prothrombin Time: 26.7 — ABNORMAL HIGH
Prothrombin Time: 27.2 — ABNORMAL HIGH

## 2011-03-14 LAB — URINALYSIS, ROUTINE W REFLEX MICROSCOPIC
Glucose, UA: NEGATIVE
Hgb urine dipstick: NEGATIVE
Ketones, ur: 15 — AB
Ketones, ur: NEGATIVE
Nitrite: POSITIVE — AB
Protein, ur: 100 — AB
Specific Gravity, Urine: 1.025
pH: 5

## 2011-03-14 LAB — CARBOXYHEMOGLOBIN
Carboxyhemoglobin: 0.9
Carboxyhemoglobin: 1.2
Methemoglobin: 0.8
O2 Saturation: 76.2
O2 Saturation: 92.1
Total hemoglobin: 10.3 — ABNORMAL LOW
Total hemoglobin: 9.4 — ABNORMAL LOW

## 2011-03-14 LAB — CALCIUM, IONIZED
Calcium, Ion: 0.95 — ABNORMAL LOW
Calcium, Ion: 1.1 — ABNORMAL LOW
Calcium, Ion: 1.13

## 2011-03-14 LAB — CULTURE, BAL-QUANTITATIVE W GRAM STAIN
Colony Count: NO GROWTH
Culture: NO GROWTH
Gram Stain: NONE SEEN

## 2011-03-14 LAB — HEPARIN INDUCED PLATELET AGGREGATION (CONVERTED LAB)
Heparin 1 Donor: 4
Heparin 100 Donor: 3
Heparin 100 Patient: 10
Interpretation-HITDU: NEGATIVE
Saline Donor:: 5
Saline Patient:: 14

## 2011-03-14 LAB — I-STAT 8, (EC8 V) (CONVERTED LAB)
BUN: 10
Bicarbonate: 29.2 — ABNORMAL HIGH
HCT: 42
Operator id: 277751
TCO2: 31
pCO2, Ven: 59 — ABNORMAL HIGH

## 2011-03-14 LAB — CARDIAC PANEL(CRET KIN+CKTOT+MB+TROPI)
CK, MB: 14.3 — ABNORMAL HIGH
Relative Index: 4.7 — ABNORMAL HIGH

## 2011-03-14 LAB — HEPARIN ASSOCIATED ANTIBODY DETECTION (CONVERTED LAB): Heparin Associated Ab Detection: NEGATIVE

## 2011-03-14 LAB — AMYLASE: Amylase: 558 — ABNORMAL HIGH

## 2011-03-14 LAB — PREPARE FRESH FROZEN PLASMA

## 2011-03-14 LAB — HEPATIC FUNCTION PANEL
ALT: 29
AST: 108 — ABNORMAL HIGH
AST: 25
Albumin: 1.6 — ABNORMAL LOW
Albumin: 2.3 — ABNORMAL LOW
Alkaline Phosphatase: 54
Bilirubin, Direct: 3.6 — ABNORMAL HIGH
Total Bilirubin: 5.5 — ABNORMAL HIGH
Total Bilirubin: 8 — ABNORMAL HIGH

## 2011-03-14 LAB — URINE CULTURE: Colony Count: 40000

## 2011-03-14 LAB — PHOSPHORUS
Phosphorus: 3.4
Phosphorus: 3.4
Phosphorus: 3.6

## 2011-03-14 LAB — CORTISOL: Cortisol, Plasma: 38

## 2011-03-14 LAB — D-DIMER, QUANTITATIVE: D-Dimer, Quant: 2.95 — ABNORMAL HIGH

## 2011-03-14 LAB — SODIUM, URINE, RANDOM: Sodium, Ur: 37

## 2011-03-14 LAB — DIGOXIN LEVEL: Digoxin Level: 0.7 — ABNORMAL LOW

## 2011-03-14 LAB — TYPE AND SCREEN: Antibody Screen: NEGATIVE

## 2011-03-14 LAB — LIPID PANEL: VLDL: 41 — ABNORMAL HIGH

## 2011-03-14 LAB — CREATININE, URINE, RANDOM: Creatinine, Urine: 145.6

## 2011-03-14 LAB — LACTIC ACID, PLASMA: Lactic Acid, Venous: 1.5

## 2011-03-14 LAB — HEPARIN LEVEL (UNFRACTIONATED)
Heparin Unfractionated: 0.18 — ABNORMAL LOW
Heparin Unfractionated: 0.25 — ABNORMAL LOW
Heparin Unfractionated: <0.1 — ABNORMAL LOW

## 2011-03-14 LAB — APTT
aPTT: 111 — ABNORMAL HIGH
aPTT: 35

## 2011-03-14 LAB — MAGNESIUM
Magnesium: 2.2
Magnesium: 2.4

## 2011-03-14 LAB — POCT I-STAT CREATININE
Creatinine, Ser: 1
Operator id: 277751

## 2011-03-14 LAB — B-NATRIURETIC PEPTIDE (CONVERTED LAB): Pro B Natriuretic peptide (BNP): 436 — ABNORMAL HIGH

## 2011-03-17 LAB — COMPREHENSIVE METABOLIC PANEL
ALT: 62 — ABNORMAL HIGH
ALT: 64 — ABNORMAL HIGH
ALT: 75 — ABNORMAL HIGH
Albumin: 3.6
Alkaline Phosphatase: 126 — ABNORMAL HIGH
Alkaline Phosphatase: 140 — ABNORMAL HIGH
BUN: 13
CO2: 27
CO2: 28
Calcium: 9.1
Calcium: 9.7
Chloride: 101
GFR calc non Af Amer: 43 — ABNORMAL LOW
GFR calc non Af Amer: 48 — ABNORMAL LOW
Glucose, Bld: 100 — ABNORMAL HIGH
Glucose, Bld: 128 — ABNORMAL HIGH
Glucose, Bld: 89
Potassium: 3.5
Potassium: 4.8
Sodium: 131 — ABNORMAL LOW
Sodium: 136
Sodium: 137
Total Bilirubin: 1.2
Total Protein: 7.8

## 2011-03-17 LAB — BASIC METABOLIC PANEL
BUN: 18
BUN: 24 — ABNORMAL HIGH
BUN: 66 — ABNORMAL HIGH
BUN: 67 — ABNORMAL HIGH
BUN: 75 — ABNORMAL HIGH
CO2: 15 — ABNORMAL LOW
CO2: 19
CO2: 21
CO2: 22
CO2: 28
Calcium: 8 — ABNORMAL LOW
Calcium: 8.2 — ABNORMAL LOW
Calcium: 8.2 — ABNORMAL LOW
Calcium: 8.4
Calcium: 8.5
Calcium: 8.5
Calcium: 9.2
Chloride: 106
Chloride: 108
Chloride: 110
Chloride: 111
Chloride: 103
Creatinine, Ser: 2.12 — ABNORMAL HIGH
Creatinine, Ser: 2.71 — ABNORMAL HIGH
Creatinine, Ser: 3.81 — ABNORMAL HIGH
Creatinine, Ser: 4.16 — ABNORMAL HIGH
Creatinine, Ser: 4.31 — ABNORMAL HIGH
GFR calc Af Amer: 13 — ABNORMAL LOW
GFR calc Af Amer: 19 — ABNORMAL LOW
GFR calc Af Amer: 22 — ABNORMAL LOW
GFR calc Af Amer: 29 — ABNORMAL LOW
GFR calc Af Amer: 49 — ABNORMAL LOW
GFR calc non Af Amer: 14 — ABNORMAL LOW
GFR calc non Af Amer: 15 — ABNORMAL LOW
GFR calc non Af Amer: 23 — ABNORMAL LOW
Glucose, Bld: 110 — ABNORMAL HIGH
Glucose, Bld: 118 — ABNORMAL HIGH
Glucose, Bld: 86
Glucose, Bld: 90
Glucose, Bld: 99
Potassium: 3.4 — ABNORMAL LOW
Potassium: 3.5
Potassium: 4
Potassium: 4.3
Potassium: 4.4
Sodium: 136
Sodium: 136
Sodium: 140
Sodium: 139

## 2011-03-17 LAB — MAGNESIUM
Magnesium: 2.1
Magnesium: 2.5

## 2011-03-17 LAB — CBC
HCT: 22.8 — ABNORMAL LOW
HCT: 24.1 — ABNORMAL LOW
HCT: 24.9 — ABNORMAL LOW
HCT: 25.6 — ABNORMAL LOW
HCT: 33.5 — ABNORMAL LOW
HCT: 34.1 — ABNORMAL LOW
Hemoglobin: 8.4 — ABNORMAL LOW
Hemoglobin: 10.8 — ABNORMAL LOW
Hemoglobin: 10.9 — ABNORMAL LOW
Hemoglobin: 11.3 — ABNORMAL LOW
Hemoglobin: 11.3 — ABNORMAL LOW
MCHC: 33.2
MCHC: 34
MCHC: 34.2
MCHC: 34.3
MCHC: 34.3
MCHC: 33
MCHC: 33.1
MCHC: 33.5
MCHC: 33.5
MCHC: 33.7
MCV: 84.1
MCV: 84.6
MCV: 84.7
MCV: 85.1
MCV: 85.8
MCV: 86.1
Platelets: 143 — ABNORMAL LOW
Platelets: 156
Platelets: 197
Platelets: 241
Platelets: 246
Platelets: 263
Platelets: 93 — ABNORMAL LOW
Platelets: 223
RBC: 2.84 — ABNORMAL LOW
RBC: 3.05 — ABNORMAL LOW
RBC: 3.76 — ABNORMAL LOW
RBC: 3.82 — ABNORMAL LOW
RBC: 3.96
RDW: 15.9 — ABNORMAL HIGH
RDW: 16 — ABNORMAL HIGH
RDW: 16 — ABNORMAL HIGH
RDW: 16.2 — ABNORMAL HIGH
RDW: 16.6 — ABNORMAL HIGH
RDW: 15.3
RDW: 15.9 — ABNORMAL HIGH
WBC: 11.4 — ABNORMAL HIGH
WBC: 12 — ABNORMAL HIGH
WBC: 13.1 — ABNORMAL HIGH
WBC: 13.7 — ABNORMAL HIGH
WBC: 14 — ABNORMAL HIGH

## 2011-03-17 LAB — URINALYSIS, ROUTINE W REFLEX MICROSCOPIC
Bilirubin Urine: NEGATIVE
Ketones, ur: NEGATIVE
Nitrite: NEGATIVE
Urobilinogen, UA: 0.2

## 2011-03-17 LAB — PROTIME-INR
INR: 2.3 — ABNORMAL HIGH
INR: 2.5 — ABNORMAL HIGH
INR: 2.8 — ABNORMAL HIGH
INR: 3.1 — ABNORMAL HIGH
INR: 3.5 — ABNORMAL HIGH
INR: 4.3 — ABNORMAL HIGH
INR: 1.5
INR: 1.7 — ABNORMAL HIGH
Prothrombin Time: 27.6 — ABNORMAL HIGH
Prothrombin Time: 28.3 — ABNORMAL HIGH
Prothrombin Time: 30.3 — ABNORMAL HIGH
Prothrombin Time: 32.8 — ABNORMAL HIGH
Prothrombin Time: 36.8 — ABNORMAL HIGH
Prothrombin Time: 18.3 — ABNORMAL HIGH
Prothrombin Time: 24.2 — ABNORMAL HIGH

## 2011-03-17 LAB — DIFFERENTIAL
Basophils Relative: 0
Basophils Relative: 1
Eosinophils Absolute: 0.1
Eosinophils Absolute: 0.1
Lymphs Abs: 1.2
Lymphs Abs: 1.5
Monocytes Absolute: 0.4
Monocytes Relative: 3
Neutro Abs: 11.2 — ABNORMAL HIGH
Neutro Abs: 6.3
Neutrophils Relative %: 74
Neutrophils Relative %: 87 — ABNORMAL HIGH

## 2011-03-17 LAB — PHOSPHORUS
Phosphorus: 4.7 — ABNORMAL HIGH
Phosphorus: 5.6 — ABNORMAL HIGH

## 2011-03-17 LAB — HEPATIC FUNCTION PANEL
ALT: 22
ALT: 61 — ABNORMAL HIGH
AST: 18
AST: 21
AST: 22
AST: 71 — ABNORMAL HIGH
Albumin: 2 — ABNORMAL LOW
Albumin: 2.1 — ABNORMAL LOW
Alkaline Phosphatase: 84
Alkaline Phosphatase: 97
Bilirubin, Direct: 1.6 — ABNORMAL HIGH
Bilirubin, Direct: 2.1 — ABNORMAL HIGH
Bilirubin, Direct: 2.7 — ABNORMAL HIGH
Bilirubin, Direct: 0.5 — ABNORMAL HIGH
Indirect Bilirubin: 1.1 — ABNORMAL HIGH
Total Bilirubin: 4.6 — ABNORMAL HIGH
Total Bilirubin: 6.1 — ABNORMAL HIGH
Total Protein: 5.6 — ABNORMAL LOW
Total Protein: 6

## 2011-03-17 LAB — CLOSTRIDIUM DIFFICILE EIA
C difficile Toxins A+B, EIA: NEGATIVE
C difficile Toxins A+B, EIA: NEGATIVE

## 2011-03-17 LAB — B-NATRIURETIC PEPTIDE (CONVERTED LAB)
Pro B Natriuretic peptide (BNP): 786 — ABNORMAL HIGH
Pro B Natriuretic peptide (BNP): 847 — ABNORMAL HIGH

## 2011-03-17 LAB — URINE CULTURE

## 2011-03-17 LAB — TYPE AND SCREEN
ABO/RH(D): A NEG
Antibody Screen: POSITIVE

## 2011-03-17 LAB — CULTURE, BLOOD (ROUTINE X 2): Culture: NO GROWTH

## 2011-03-17 LAB — HEPARIN LEVEL (UNFRACTIONATED): Heparin Unfractionated: <0.1 — ABNORMAL LOW

## 2011-03-17 LAB — CROSSMATCH
ABO/RH(D): A NEG
Antibody Screen: NEGATIVE

## 2011-03-17 LAB — DIGOXIN LEVEL: Digoxin Level: 1.2

## 2011-03-17 LAB — LIPASE, BLOOD: Lipase: 40

## 2011-03-20 LAB — BASIC METABOLIC PANEL
BUN: 9
CO2: 30
Chloride: 103
Creatinine, Ser: 0.78
GFR calc Af Amer: >60
Potassium: 3.6

## 2011-03-20 LAB — POCT HEMOGLOBIN-HEMACUE: Hemoglobin: 11.6 — ABNORMAL LOW

## 2011-03-20 LAB — PROTIME-INR: INR: 1.1

## 2011-03-28 ENCOUNTER — Encounter: Payer: Self-pay | Admitting: Pharmacist

## 2011-03-28 ENCOUNTER — Ambulatory Visit (INDEPENDENT_AMBULATORY_CARE_PROVIDER_SITE_OTHER): Payer: BC Managed Care – PPO | Admitting: *Deleted

## 2011-03-28 DIAGNOSIS — Z9889 Other specified postprocedural states: Secondary | ICD-10-CM

## 2011-03-28 DIAGNOSIS — I4891 Unspecified atrial fibrillation: Secondary | ICD-10-CM

## 2011-03-28 DIAGNOSIS — I059 Rheumatic mitral valve disease, unspecified: Secondary | ICD-10-CM

## 2011-03-28 LAB — POCT INR: INR: 3.3

## 2011-04-07 LAB — BASIC METABOLIC PANEL
Chloride: 99
Creatinine, Ser: 0.78
GFR calc Af Amer: >60
GFR calc non Af Amer: >60

## 2011-04-07 LAB — PROTIME-INR
INR: 2.1 — ABNORMAL HIGH
Prothrombin Time: 24.5 — ABNORMAL HIGH

## 2011-04-07 LAB — POCT HEMOGLOBIN-HEMACUE: Hemoglobin: 11.8 — ABNORMAL LOW

## 2011-04-08 LAB — CBC
HCT: 32.4 — ABNORMAL LOW
HCT: 34.6 — ABNORMAL LOW
HCT: 35.1 — ABNORMAL LOW
HCT: 35.4 — ABNORMAL LOW
HCT: 37.4
Hemoglobin: 10.8 — ABNORMAL LOW
Hemoglobin: 11.5 — ABNORMAL LOW
Hemoglobin: 11.7 — ABNORMAL LOW
Hemoglobin: 11.8 — ABNORMAL LOW
MCHC: 33.2
MCHC: 33.9
MCV: 84.7
MCV: 86.4
MCV: 86.5
MCV: 86.9
MCV: 86.9
Platelets: 251
Platelets: 289
RBC: 3.58 — ABNORMAL LOW
RBC: 3.73 — ABNORMAL LOW
RDW: 14.3 — ABNORMAL HIGH
RDW: 14.4 — ABNORMAL HIGH
RDW: 14.4 — ABNORMAL HIGH
WBC: 10.4
WBC: 14.5 — ABNORMAL HIGH
WBC: 8.1
WBC: 8.2
WBC: 9.9

## 2011-04-08 LAB — APTT
aPTT: 47 — ABNORMAL HIGH
aPTT: 98 — ABNORMAL HIGH

## 2011-04-08 LAB — POCT CARDIAC MARKERS
CKMB, poc: <1 — ABNORMAL LOW
CKMB, poc: <1 — ABNORMAL LOW
Myoglobin, poc: 45.2
Operator id: 1192
Troponin i, poc: <0.05
Troponin i, poc: <0.05

## 2011-04-08 LAB — BASIC METABOLIC PANEL
BUN: 15
CO2: 27
Chloride: 106
Chloride: 107
Chloride: 96
Chloride: 99
Creatinine, Ser: 0.99
Creatinine, Ser: 1.06
GFR calc Af Amer: >60
GFR calc Af Amer: >60
GFR calc non Af Amer: >60
Glucose, Bld: 106 — ABNORMAL HIGH
Glucose, Bld: 136 — ABNORMAL HIGH
Potassium: 3.2 — ABNORMAL LOW
Potassium: 3.6
Potassium: 3.9
Potassium: 4
Sodium: 135

## 2011-04-08 LAB — HEPATIC FUNCTION PANEL
ALT: 30
Alkaline Phosphatase: 81
Indirect Bilirubin: 0.7
Total Bilirubin: 0.9

## 2011-04-08 LAB — COMPREHENSIVE METABOLIC PANEL
Albumin: 3.1 — ABNORMAL LOW
Alkaline Phosphatase: 73
BUN: 10
Creatinine, Ser: 0.86
Glucose, Bld: 114 — ABNORMAL HIGH
Potassium: 3.5
Total Protein: 6.8

## 2011-04-08 LAB — URINE CULTURE: Colony Count: >=100000

## 2011-04-08 LAB — DIFFERENTIAL
Basophils Absolute: 0
Basophils Relative: 0
Eosinophils Absolute: 0.1
Eosinophils Relative: 0
Neutrophils Relative %: 79 — ABNORMAL HIGH

## 2011-04-08 LAB — CARDIAC PANEL(CRET KIN+CKTOT+MB+TROPI)
CK, MB: 0.9
Total CK: 24
Total CK: 25
Troponin I: 0.02
Troponin I: 0.02

## 2011-04-08 LAB — TSH: TSH: 1.098

## 2011-04-08 LAB — LIPID PANEL
Total CHOL/HDL Ratio: 3.3
VLDL: 15

## 2011-04-08 LAB — URINALYSIS, MICROSCOPIC ONLY
Bilirubin Urine: NEGATIVE
Glucose, UA: NEGATIVE
Hgb urine dipstick: NEGATIVE
Ketones, ur: NEGATIVE
Protein, ur: NEGATIVE
pH: 6

## 2011-04-08 LAB — PROTIME-INR
INR: 1.8 — ABNORMAL HIGH
INR: 2 — ABNORMAL HIGH
INR: 2.2 — ABNORMAL HIGH
INR: 2.3 — ABNORMAL HIGH
Prothrombin Time: 23.3 — ABNORMAL HIGH

## 2011-04-08 LAB — HEPARIN LEVEL (UNFRACTIONATED): Heparin Unfractionated: 0.26 — ABNORMAL LOW

## 2011-04-08 LAB — MAGNESIUM: Magnesium: 2.1

## 2011-04-08 LAB — DIGOXIN LEVEL: Digoxin Level: 1

## 2011-04-08 LAB — LIPASE, BLOOD: Lipase: 29

## 2011-04-25 ENCOUNTER — Encounter: Payer: Self-pay | Admitting: Cardiology

## 2011-04-25 ENCOUNTER — Ambulatory Visit (INDEPENDENT_AMBULATORY_CARE_PROVIDER_SITE_OTHER): Payer: BC Managed Care – PPO | Admitting: *Deleted

## 2011-04-25 DIAGNOSIS — I059 Rheumatic mitral valve disease, unspecified: Secondary | ICD-10-CM

## 2011-04-25 DIAGNOSIS — I4891 Unspecified atrial fibrillation: Secondary | ICD-10-CM

## 2011-04-25 DIAGNOSIS — Z9889 Other specified postprocedural states: Secondary | ICD-10-CM

## 2011-04-25 LAB — POCT INR: INR: 3.2

## 2011-05-06 ENCOUNTER — Ambulatory Visit: Payer: BC Managed Care – PPO | Admitting: Cardiology

## 2011-05-23 ENCOUNTER — Encounter: Payer: Self-pay | Admitting: Cardiology

## 2011-05-23 ENCOUNTER — Ambulatory Visit (INDEPENDENT_AMBULATORY_CARE_PROVIDER_SITE_OTHER): Payer: BC Managed Care – PPO | Admitting: *Deleted

## 2011-05-23 DIAGNOSIS — Z9889 Other specified postprocedural states: Secondary | ICD-10-CM

## 2011-05-23 DIAGNOSIS — I4891 Unspecified atrial fibrillation: Secondary | ICD-10-CM

## 2011-05-23 DIAGNOSIS — I059 Rheumatic mitral valve disease, unspecified: Secondary | ICD-10-CM

## 2011-05-23 LAB — POCT INR: INR: 3.6

## 2011-05-29 ENCOUNTER — Encounter: Payer: Self-pay | Admitting: *Deleted

## 2011-05-29 ENCOUNTER — Ambulatory Visit (INDEPENDENT_AMBULATORY_CARE_PROVIDER_SITE_OTHER): Payer: BC Managed Care – PPO | Admitting: Cardiology

## 2011-05-29 ENCOUNTER — Encounter: Payer: Self-pay | Admitting: Cardiology

## 2011-05-29 VITALS — BP 132/64 | HR 69 | Ht 64.0 in | Wt 173.0 lb

## 2011-05-29 DIAGNOSIS — I059 Rheumatic mitral valve disease, unspecified: Secondary | ICD-10-CM

## 2011-05-29 DIAGNOSIS — I4891 Unspecified atrial fibrillation: Secondary | ICD-10-CM

## 2011-05-29 DIAGNOSIS — I2789 Other specified pulmonary heart diseases: Secondary | ICD-10-CM

## 2011-05-29 DIAGNOSIS — E7800 Pure hypercholesterolemia, unspecified: Secondary | ICD-10-CM

## 2011-05-29 DIAGNOSIS — Z9889 Other specified postprocedural states: Secondary | ICD-10-CM

## 2011-05-29 DIAGNOSIS — Z8679 Personal history of other diseases of the circulatory system: Secondary | ICD-10-CM

## 2011-05-29 DIAGNOSIS — I10 Essential (primary) hypertension: Secondary | ICD-10-CM

## 2011-05-29 MED ORDER — WARFARIN SODIUM 5 MG PO TABS: ORAL_TABLET | ORAL | Status: DC

## 2011-05-29 MED ORDER — DIGOXIN 250 MCG PO TABS: 250.0000 ug | ORAL_TABLET | Freq: Every day | ORAL | Status: DC

## 2011-05-29 NOTE — Patient Instructions (Signed)
Your physician wants you to follow-up in:6 months with Dr Wall. You will receive a reminder letter in the mail two months in advance. If you don't receive a letter, please call our office to schedule the follow-up appointment.  Your physician recommends that you continue on your current medications as directed. Please refer to the Current Medication list given to you today.  

## 2011-05-29 NOTE — Assessment & Plan Note (Signed)
Stable. No change in treatment. 

## 2011-05-29 NOTE — Assessment & Plan Note (Addendum)
Stable by history and exam. No indication for echocardiogram. We will see her back in 6 months.

## 2011-05-29 NOTE — Assessment & Plan Note (Signed)
Labs reviewed. Not due until next visit.

## 2011-05-29 NOTE — Progress Notes (Signed)
Addended by: Waymon Budge on: 05/29/2011 01:00 PM   Modules accepted: Orders

## 2011-05-29 NOTE — Progress Notes (Signed)
HPI [name] returns today for evaluation and management of her history of rheumatic heart disease, status post mitral valve replacement, chronic A. Fib, history of hypertension hyperlipidemia, and anticoagulation.  She's doing well with no complaints today. She denies palpitations, chest pain, presyncope, syncope, dyspnea on exertion, orthopnea, PND, or edema.  She's very compliant with her medications. She denies any bleeding on Coumadin.  Past Medical History  Diagnosis Date  . Atrial fibrillation   . Rheumatic heart disease   . HTN (hypertension)     unspecified  . Pulmonary HTN     mild  . Hypercholesterolemia   . GERD (gastroesophageal reflux disease)   . Goiter     multinodular    Current Outpatient Prescriptions  Medication Sig Dispense Refill  . atorvastatin (LIPITOR) 40 MG tablet Take 1 tablet (40 mg total) by mouth daily.  30 tablet  11  . digoxin (LANOXIN) 0.25 MG tablet TAKE ONE TABLET BY MOUTH EVERY DAY  30 tablet  6  . metoprolol succinate (TOPROL-XL) 25 MG 24 hr tablet TAKE ONE TABLET BY MOUTH EVERY DAY  30 tablet  12  . omeprazole (PRILOSEC) 20 MG capsule Take 20 mg by mouth daily.        Marland Kitchen warfarin (COUMADIN) 5 MG tablet Take as directed by Anticoagulation clinic   35 tablet  3    No Known Allergies  Family History  Problem Relation Age of Onset  . Hypothyroidism Sister   . Gallbladder disease Sister   . Coronary artery disease      History   Social History  . Marital Status: Divorced    Spouse Name: N/A    Number of Children: N/A  . Years of Education: N/A   Occupational History  . Geologist, engineering    Social History Main Topics  . Smoking status: Never Smoker   . Smokeless tobacco: Not on file  . Alcohol Use: Not on file  . Drug Use: Not on file  . Sexually Active: Not on file   Other Topics Concern  . Not on file   Social History Narrative  . No narrative on file    ROS ALL NEGATIVE EXCEPT THOSE NOTED IN HPI  PE  General  Appearance: well developed, well nourished in no acute distress, overweight HEENT: symmetrical face, PERRLA, good dentition  Neck: no JVD, thyromegaly, or adenopathy, trachea midline Chest: symmetric without deformity Cardiac: PMI non-displaced, Irregular rate and rhythm, Prosthetic S1, S2, no gallop, Soft diastolic murmur at the apex Lung: clear to ausculation and percussion Vascular: all pulses full without bruits  Abdominal: nondistended, nontender, good bowel sounds, no HSM, no bruits Extremities: no cyanosis, clubbing or edema, no sign of DVT, no varicosities  Skin: normal color, no rashes Neuro: alert and oriented x 3, non-focal Pysch: normal affect  EKG Chronic A. Fib with ST segment changes, stable BMET    Component Value Date/Time   NA 137 08/19/2010 0749   K 3.8 08/19/2010 0749   CL 105 08/19/2010 0749   CO2 23 08/19/2010 0749   GLUCOSE 155* 08/19/2010 0749   BUN 9 08/19/2010 0749   CREATININE 0.82 08/19/2010 0749   CALCIUM 9.0 08/19/2010 0749   GFRNONAA >60 08/19/2010 0749   GFRAA  Value: >60        The eGFR has been calculated using the MDRD equation. This calculation has not been validated in all clinical situations. eGFR's persistently <60 mL/min signify possible Chronic Kidney Disease. 08/19/2010 0749    Lipid Panel  Component Value Date/Time   CHOL 151 12/16/2010 0836   TRIG 123.0 12/16/2010 0836   HDL 32.40* 12/16/2010 0836   CHOLHDL 5 12/16/2010 0836   VLDL 24.6 12/16/2010 0836   LDLCALC 94 12/16/2010 0836    CBC    Component Value Date/Time   WBC 6.8 08/19/2010 0749   RBC 4.46 08/19/2010 0749   HGB 12.4 08/19/2010 0749   HCT 38.4 08/19/2010 0749   PLT 145* 08/19/2010 0749   MCV 86.1 08/19/2010 0749   MCH 27.8 08/19/2010 0749   MCHC 32.3 08/19/2010 0749   RDW 13.8 08/19/2010 0749   LYMPHSABS 1.3 08/19/2010 0749   MONOABS 0.4 08/19/2010 0749   EOSABS 0.1 08/19/2010 0749   BASOSABS 0.0 08/19/2010 0749

## 2011-05-29 NOTE — Assessment & Plan Note (Signed)
Improved.  No change in treatment 

## 2011-06-20 ENCOUNTER — Ambulatory Visit (INDEPENDENT_AMBULATORY_CARE_PROVIDER_SITE_OTHER): Payer: BC Managed Care – PPO | Admitting: *Deleted

## 2011-06-20 DIAGNOSIS — I4891 Unspecified atrial fibrillation: Secondary | ICD-10-CM

## 2011-06-20 DIAGNOSIS — Z9889 Other specified postprocedural states: Secondary | ICD-10-CM

## 2011-06-20 DIAGNOSIS — I059 Rheumatic mitral valve disease, unspecified: Secondary | ICD-10-CM

## 2011-07-04 ENCOUNTER — Ambulatory Visit (INDEPENDENT_AMBULATORY_CARE_PROVIDER_SITE_OTHER): Payer: BC Managed Care – PPO | Admitting: *Deleted

## 2011-07-04 DIAGNOSIS — I059 Rheumatic mitral valve disease, unspecified: Secondary | ICD-10-CM

## 2011-07-04 DIAGNOSIS — I4891 Unspecified atrial fibrillation: Secondary | ICD-10-CM

## 2011-07-04 DIAGNOSIS — Z9889 Other specified postprocedural states: Secondary | ICD-10-CM

## 2011-07-04 LAB — POCT INR: INR: 3.2

## 2011-07-25 ENCOUNTER — Ambulatory Visit (INDEPENDENT_AMBULATORY_CARE_PROVIDER_SITE_OTHER): Payer: BC Managed Care – PPO | Admitting: Pharmacist

## 2011-07-25 DIAGNOSIS — I4891 Unspecified atrial fibrillation: Secondary | ICD-10-CM

## 2011-07-25 DIAGNOSIS — Z9889 Other specified postprocedural states: Secondary | ICD-10-CM

## 2011-07-25 DIAGNOSIS — I059 Rheumatic mitral valve disease, unspecified: Secondary | ICD-10-CM

## 2011-07-25 LAB — POCT INR: INR: 3

## 2011-08-14 ENCOUNTER — Encounter (HOSPITAL_COMMUNITY): Payer: Self-pay | Admitting: Emergency Medicine

## 2011-08-14 ENCOUNTER — Emergency Department (HOSPITAL_COMMUNITY): Payer: BC Managed Care – PPO

## 2011-08-14 ENCOUNTER — Inpatient Hospital Stay (HOSPITAL_COMMUNITY)
Admission: EM | Admit: 2011-08-14 | Discharge: 2011-08-22 | DRG: 208 | Disposition: A | Discharge status: Home / Self Care | Payer: BC Managed Care – PPO | Source: Ambulatory Visit | Attending: Internal Medicine | Admitting: Internal Medicine

## 2011-08-14 ENCOUNTER — Other Ambulatory Visit: Payer: Self-pay

## 2011-08-14 DIAGNOSIS — K8309 Other cholangitis: Principal | ICD-10-CM | POA: Diagnosis present

## 2011-08-14 DIAGNOSIS — I482 Chronic atrial fibrillation, unspecified: Secondary | ICD-10-CM | POA: Diagnosis present

## 2011-08-14 DIAGNOSIS — D649 Anemia, unspecified: Secondary | ICD-10-CM | POA: Diagnosis present

## 2011-08-14 DIAGNOSIS — R1013 Epigastric pain: Secondary | ICD-10-CM | POA: Diagnosis present

## 2011-08-14 DIAGNOSIS — K8050 Calculus of bile duct without cholangitis or cholecystitis without obstruction: Secondary | ICD-10-CM | POA: Diagnosis present

## 2011-08-14 DIAGNOSIS — Z7901 Long term (current) use of anticoagulants: Secondary | ICD-10-CM

## 2011-08-14 DIAGNOSIS — I099 Rheumatic heart disease, unspecified: Secondary | ICD-10-CM | POA: Diagnosis present

## 2011-08-14 DIAGNOSIS — I10 Essential (primary) hypertension: Secondary | ICD-10-CM | POA: Diagnosis present

## 2011-08-14 DIAGNOSIS — Z9889 Other specified postprocedural states: Secondary | ICD-10-CM

## 2011-08-14 DIAGNOSIS — K219 Gastro-esophageal reflux disease without esophagitis: Secondary | ICD-10-CM | POA: Diagnosis present

## 2011-08-14 DIAGNOSIS — Z954 Presence of other heart-valve replacement: Secondary | ICD-10-CM

## 2011-08-14 DIAGNOSIS — I4891 Unspecified atrial fibrillation: Secondary | ICD-10-CM | POA: Diagnosis present

## 2011-08-14 LAB — COMPREHENSIVE METABOLIC PANEL
AST: 114 U/L — ABNORMAL HIGH (ref 0–37)
Albumin: 3.1 g/dL — ABNORMAL LOW (ref 3.5–5.2)
Chloride: 105 mEq/L (ref 96–112)
Creatinine, Ser: 0.72 mg/dL (ref 0.50–1.10)
GFR calc Af Amer: >90 mL/min (ref >90)
GFR calc non Af Amer: >90 mL/min (ref >90)
Total Bilirubin: 0.9 mg/dL (ref 0.3–1.2)
Total Protein: 6.9 g/dL (ref 6.0–8.3)

## 2011-08-14 LAB — DIFFERENTIAL
Basophils Absolute: 0 10*3/uL (ref 0.0–0.1)
Basophils Relative: 0 % (ref 0–1)
Monocytes Absolute: 0.4 10*3/uL (ref 0.1–1.0)
Neutro Abs: 8.6 10*3/uL — ABNORMAL HIGH (ref 1.7–7.7)
Neutrophils Relative %: 89 % — ABNORMAL HIGH (ref 43–77)

## 2011-08-14 LAB — CBC
HCT: 35.9 % — ABNORMAL LOW (ref 36.0–46.0)
MCHC: 31.2 g/dL (ref 30.0–36.0)
RDW: 13.9 % (ref 11.5–15.5)

## 2011-08-14 LAB — LIPASE, BLOOD: Lipase: 37 U/L (ref 11–59)

## 2011-08-14 MED ORDER — SODIUM CHLORIDE 0.9 % IJ SOLN
3.0000 mL | Freq: Two times a day (BID) | INTRAMUSCULAR | Status: DC
  Administered 2011-08-15: 3 mL via INTRAVENOUS

## 2011-08-14 MED ORDER — HYDROCODONE-ACETAMINOPHEN 5-325 MG PO TABS
1.0000 | ORAL_TABLET | ORAL | Status: DC | PRN
  Administered 2011-08-17: 1 via ORAL
  Filled 2011-08-14: qty 1

## 2011-08-14 MED ORDER — ACETAMINOPHEN 650 MG RE SUPP: 650.0000 mg | Freq: Four times a day (QID) | RECTAL | Status: DC | PRN

## 2011-08-14 MED ORDER — ALBUTEROL SULFATE (5 MG/ML) 0.5% IN NEBU: 2.5000 mg | INHALATION_SOLUTION | RESPIRATORY_TRACT | Status: DC | PRN

## 2011-08-14 MED ORDER — HEPARIN SOD (PORCINE) IN D5W 100 UNIT/ML IV SOLN
1200.0000 [IU]/h | INTRAVENOUS | Status: DC
  Administered 2011-08-14: 1000 [IU]/h via INTRAVENOUS
  Administered 2011-08-15: 1200 [IU]/h via INTRAVENOUS
  Filled 2011-08-14 (×2): qty 250

## 2011-08-14 MED ORDER — IOHEXOL 300 MG/ML  SOLN
80.0000 mL | Freq: Once | INTRAMUSCULAR | Status: AC | PRN
  Administered 2011-08-14: 80 mL via INTRAVENOUS

## 2011-08-14 MED ORDER — MORPHINE SULFATE 4 MG/ML IJ SOLN
4.0000 mg | Freq: Once | INTRAMUSCULAR | Status: AC
  Administered 2011-08-14: 4 mg via INTRAVENOUS
  Filled 2011-08-14: qty 1

## 2011-08-14 MED ORDER — GI COCKTAIL ~~LOC~~
30.0000 mL | Freq: Once | ORAL | Status: AC
  Administered 2011-08-14: 30 mL via ORAL
  Filled 2011-08-14: qty 30

## 2011-08-14 MED ORDER — PANTOPRAZOLE SODIUM 40 MG IV SOLR
40.0000 mg | Freq: Every day | INTRAVENOUS | Status: DC
  Administered 2011-08-14: 40 mg via INTRAVENOUS
  Filled 2011-08-14 (×3): qty 40

## 2011-08-14 MED ORDER — PIPERACILLIN-TAZOBACTAM 3.375 G IVPB
3.3750 g | Freq: Three times a day (TID) | INTRAVENOUS | Status: DC
  Administered 2011-08-15 – 2011-08-16 (×4): 3.375 g via INTRAVENOUS
  Filled 2011-08-14 (×6): qty 50

## 2011-08-14 MED ORDER — ONDANSETRON HCL 4 MG/2ML IJ SOLN
4.0000 mg | Freq: Once | INTRAMUSCULAR | Status: AC
  Administered 2011-08-14: 4 mg via INTRAVENOUS
  Filled 2011-08-14: qty 2

## 2011-08-14 MED ORDER — MORPHINE SULFATE 2 MG/ML IJ SOLN
1.0000 mg | INTRAMUSCULAR | Status: DC | PRN
  Administered 2011-08-15: 1 mg via INTRAVENOUS
  Filled 2011-08-14: qty 1

## 2011-08-14 MED ORDER — ROSUVASTATIN CALCIUM 20 MG PO TABS
20.0000 mg | ORAL_TABLET | Freq: Every day | ORAL | Status: DC
  Administered 2011-08-14 – 2011-08-21 (×8): 20 mg via ORAL
  Filled 2011-08-14 (×11): qty 1

## 2011-08-14 MED ORDER — ACETAMINOPHEN 325 MG PO TABS
650.0000 mg | ORAL_TABLET | Freq: Four times a day (QID) | ORAL | Status: DC | PRN
  Administered 2011-08-19: 650 mg via ORAL
  Filled 2011-08-14: qty 2

## 2011-08-14 MED ORDER — SODIUM CHLORIDE 0.9 % IV SOLN
INTRAVENOUS | Status: DC
  Administered 2011-08-14: 17:00:00 via INTRAVENOUS

## 2011-08-14 MED ORDER — DIGOXIN 250 MCG PO TABS
250.0000 ug | ORAL_TABLET | Freq: Every day | ORAL | Status: DC
  Administered 2011-08-16 – 2011-08-22 (×7): 250 ug via ORAL
  Filled 2011-08-14 (×10): qty 1

## 2011-08-14 MED ORDER — POTASSIUM CHLORIDE IN NACL 20-0.9 MEQ/L-% IV SOLN
INTRAVENOUS | Status: DC
  Administered 2011-08-14 – 2011-08-15 (×2): via INTRAVENOUS
  Administered 2011-08-16: 100 mL via INTRAVENOUS
  Administered 2011-08-16 – 2011-08-22 (×14): via INTRAVENOUS
  Filled 2011-08-14 (×25): qty 1000

## 2011-08-14 MED ORDER — PIPERACILLIN-TAZOBACTAM 3.375 G IVPB
3.3750 g | Freq: Once | INTRAVENOUS | Status: AC
  Administered 2011-08-14: 3.375 g via INTRAVENOUS
  Filled 2011-08-14: qty 50

## 2011-08-14 MED ORDER — SODIUM CHLORIDE 0.9 % IV SOLN
1000.0000 mL | INTRAVENOUS | Status: DC
  Administered 2011-08-14: 1000 mL via INTRAVENOUS

## 2011-08-14 MED ORDER — ONDANSETRON HCL 4 MG/2ML IJ SOLN: 4.0000 mg | Freq: Four times a day (QID) | INTRAMUSCULAR | Status: DC | PRN

## 2011-08-14 MED ORDER — METOPROLOL SUCCINATE ER 25 MG PO TB24
25.0000 mg | ORAL_TABLET | Freq: Every day | ORAL | Status: DC
  Administered 2011-08-14 – 2011-08-22 (×8): 25 mg via ORAL
  Filled 2011-08-14 (×12): qty 1

## 2011-08-14 MED ORDER — IOHEXOL 300 MG/ML  SOLN
20.0000 mL | INTRAMUSCULAR | Status: AC
  Administered 2011-08-14 (×2): 20 mL via ORAL

## 2011-08-14 MED ORDER — ONDANSETRON HCL 4 MG PO TABS: 4.0000 mg | ORAL_TABLET | Freq: Four times a day (QID) | ORAL | Status: DC | PRN

## 2011-08-14 NOTE — ED Notes (Signed)
Per EMS: Epigastric pain that started at 0700 this morning that radiated to her back.  Had a bowel movement last night and was normal.  Pt states she took zofran this morning with no relief.  Pt is in controlled AFIB - per EMS

## 2011-08-14 NOTE — Progress Notes (Signed)
ANTICOAGULATION and ANTIBIOTIC CONSULT NOTE - Initial Consult  Pharmacy Consult for heparin and zosyn Indication: St. Jude mitral valve; suspected cholangitis  No Known Allergies  Patient Measurements:   Heparin Dosing Weight:   Vital Signs: Temp: 97.4 F (36.3 C) (02/21 0847) Temp src: Oral (02/21 0847) BP: 116/57 mmHg (02/21 1739) Pulse Rate: 87  (02/21 1739)  Labs:  [facility] 08/14/11 0954  HGB 11.2*  HCT 35.9*  PLT 146*  APTT --  LABPROT 30.5*  INR 2.87*  HEPARINUNFRC --  CREATININE 0.72  CKTOTAL --  CKMB --  TROPONINI --   The CrCl is unknown because both a height and weight (above a minimum accepted value) are required for this calculation.  Medical History: Past Medical History  Diagnosis Date  . Atrial fibrillation   . Rheumatic heart disease   . HTN (hypertension)     unspecified  . Pulmonary HTN     mild  . Hypercholesterolemia   . GERD (gastroesophageal reflux disease)   . Goiter     multinodular    Medications:  Coumadin 5mg  daily except 2.5 mg on Mondays and Fridays.  Assessment: Patient is a 56 y.o F on Coumadin PTA for afib and St. Jude's MVR.  Admitted to the ED today with c/o epigastric pain. Abd CT reveals suspicion for cholangitis with plan to hold coumadin for now and start heparin when INR <2.5  for possible ERCP on 2/22.  INR is 2.87.  To start zosyn empirically for cholangitis.  Per patient, her INR goal is 3-3.5 and last dose of coumadin taken was on 2/20.  Weight= 165 lbs, height= 5'4"  Spoke to Dr. Waymon Amato, OK to start heparin drip now since INR is below her goal range.  Goal of Therapy:  Heparin level 0.3-0.7 units/ml   Plan:  1) Start heparin drip at 1000 units/hr (no bolus) 2) Check 6 hour heparin level 3) Zosyn 3.375 gm IV q8h (infuse over 4 hours)  Nilaya Bouie P 08/14/2011,6:48 PM

## 2011-08-14 NOTE — H&P (Signed)
PCP:   [name], MD, MD   Primary gastroenterologist: [name]  Primary cardiologist: [name]  Chief Complaint:  Abdominal pain  HPI: [name] with history of atrial fibrillation, St Jude's mitral valve replacement for rheumatic heart disease, on chronic Coumadin therapy, hypertension, status post laparoscopic cholecystectomy, ERCP with stent placement and stone removal who presents to the emergency department with above complaints. [name] indicates that she was in her usual state of health until approximately 7 AM today. She was driving to work when she experienced sudden onset of 10/10 epigastric sharp pain with associated diaphoresis. She denied any nausea or vomiting. She denied any chest pain or dyspnea. She had to pull over to the side of the road. She took some kind of tablet for the pain with no significant relief. 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 She was brought to the emergency department and her pain was eventually relieved after 3-4 hours after pain medications. She had a BM this morning but cannot tell us the consistency no color. She denies fever or chills. She indicates that this episode seemed like a "gallbladder attack" is which was similar to her prior episode when she was admitted and found to be jaundiced and had to have ERCP and stent placement and eventually became septic and was admitted to the ICU for approximately a month. [name] did not have a fever or leukocytosis. Her CT scan of the abdomen was abnormal with findings as below. The ED physician consulted the surgeons who recommended medical admission and GI consultation. Gastroenterology has been consulted.  Past Medical History: Past Medical History  Diagnosis Date  . Atrial fibrillation   . Rheumatic heart disease   . HTN (hypertension)     unspecified  . Pulmonary HTN     mild  . Hypercholesterolemia   . GERD  (gastroesophageal reflux disease)   . Goiter     multinodular    Past Surgical History: Past Surgical History  Procedure Date  . Wrist arthroscopy   . Endoscopic retrograde cholangiopancreatography with stent placement and stone removal   . Carpal tunnel release   . Cardiac valve replacement   . Cardiac catheterization   . Neck surgery     Allergies:  No Known Allergies  Medications: Prior to Admission medications   Medication Sig Start Date End Date Taking? Authorizing Provider  atorvastatin (LIPITOR) 40 MG tablet Take 1 tablet (40 mg total) by mouth daily. 11/05/10 11/05/11 Yes Valera Castle, MD  digoxin (LANOXIN) 0.25 MG tablet Take 1 tablet (250 mcg total) by mouth daily. 05/29/11  Yes Valera Castle, MD  metoprolol succinate (TOPROL-XL) 25 MG 24 hr tablet TAKE ONE TABLET BY MOUTH EVERY DAY 11/04/10  Yes Valera Castle, MD  omeprazole (PRILOSEC) 20 MG capsule Take 20 mg by mouth daily.     Yes Historical Provider, MD  ondansetron (ZOFRAN) 8 MG tablet Take by mouth every 12 (twelve) hours as needed. For nausea   Yes Historical Provider, MD  warfarin (COUMADIN) 5 MG tablet Take 5 mg by mouth See admin instructions. 2.5mg  mondays and fridays, 5mg  all other days 05/29/11  Yes Valera Castle, MD    Family History: Family History  Problem Relation Age of Onset  . Hypothyroidism Sister   . Gallbladder disease Sister   . Coronary artery disease    . Diabetes Mother   . Hypertension Mother   . Heart disease Father   .  Stroke Father     Social History:  reports that she has never smoked. She does not have any smokeless tobacco history on file. She reports that she does not drink alcohol or use illicit drugs. [name] is single and lives alone. She is independent of activities of daily living. She works as a Architectural technologist at [employer].   Review of Systems:  All systems reviewed and apart from history of presenting illness are negative. Specifically [name] denies any  chest pain, dyspnea, palpitations.  Physical Exam: Filed Vitals:   08/14/11 1600 08/14/11 1645 08/14/11 1715 08/14/11 1739  BP: 125/59 137/75 116/57 116/57  Pulse: 88 81 78 87  Temp:     97.7F   TempSrc:      Resp: 22 16 20 20   SpO2: 91% 100% 95% 96%   General appearance: Moderately built and nourished female [name] who is lying comfortably in the gurney and is in no obvious distress.  Head: Nontraumatic and normocephalic.  Eyes: Pupils equally reacting to light and accommodation.  Ears: Normal  Nose: No acute findings. No sinus tenderness.  Throat: Mucosa is dry. No oral thrush.  Neck: Supple. No JVD or carotid bruit. Lymph nodes: No lymphadenopathy.  Resp: Clear to auscultation. No increased work of breathing. Midline sternotomy scar present. Cardio: First and second heart sounds heard, irregular rate and rythm. No murmurs or JVD or gallop or pedal edema. Crisp click of mechanical mitral valve heard.  GI: Non distended. Tender in the right upper quadrant, mild. Abdomen is otherwise soft. No rigidity guarding or rebound. No organomegaly or masses appreciated. Normal bowel sounds heard.  Extremities: symmetric 5/5 power. Skin: No other acute findings.  Neurologic: Alert and oriented. No focal neurological deficits.   Labs on Admission:   [facility] 08/14/11 0954  NA 141  K 3.8  CL 105  CO2 26  GLUCOSE 154*  BUN 12  CREATININE 0.72  CALCIUM 8.9  MG --  PHOS --    Basename 08/14/11 0954  AST 114*  ALT 59*  ALKPHOS 113  BILITOT 0.9  PROT 6.9  ALBUMIN 3.1*    Basename 08/14/11 0954  LIPASE 37  AMYLASE --    Basename 08/14/11 0954  WBC 9.7  NEUTROABS 8.6*  HGB 11.2*  HCT 35.9*  MCV 88.2  PLT 146*   No results found for this basename: CKTOTAL:3,CKMB:3,CKMBINDEX:3,TROPONINI:3 in the last 72 hours No results found for this basename: TSH,T4TOTAL,FREET3,T3FREE,THYROIDAB in the last 72 hours No results found for this basename:  VITAMINB12:2,FOLATE:2,FERRITIN:2,TIBC:2,IRON:2,RETICCTPCT:2 in the last 72 hours  Radiological Exams on Admission: Ct Abdomen Pelvis W Contrast  08/14/2011  *RADIOLOGY REPORT*  Clinical Data: Epigastric pain.  CT ABDOMEN AND PELVIS WITH CONTRAST  Technique:  Multidetector CT imaging of the abdomen and pelvis was performed following the standard protocol during bolus administration of intravenous contrast.  Contrast: 80mL OMNIPAQUE IOHEXOL 300 MG/ML IV SOLN  Comparison: CT of abdomen and pelvis 03/10/2008.  Findings:  Lung Bases: Dependent atelectasis in the lower lobe to the lungs bilaterally.  Heart is moderately enlarged (with severe dilatation of the left atrium).  Status post mechanical mitral valve replacement. There is a small hiatal hernia.  Abdomen/Pelvis:  Compared to the prior examination there is extensive pneumobilia (although the gas extends into the periphery of the hepatic parenchyma, it is consistently noted adjacent to branches of the portal veins, and is not consistent with portal venous gas).  Mild - moderate intrahepatic biliary ductal dilatation is present, new compared to  the prior examination.  As well, the common bile duct measures up to 17 mm in the porta hepatis.  The [name] status post cholecystectomy.  Notably, in the gallbladder fossa and porta hepatis there is ill-defined fluid attenuation and stranding, suggesting inflammation, potentially from cholangitis.  The portal vein is widely patent as is the SMV. Pancreas is unremarkable in appearance.  Specifically, no inflammatory changes and no pancreatic ductal dilatation. Splenomegaly (16.8 cm AP) is new compared to the prior examination. The diffuse appearance of the adrenal glands and kidneys bilaterally is unremarkable.  No ascites or pneumoperitoneum and no pathologic distension of bowel.  No pathologic lymphadenopathy.  Numerous colonic diverticula are present, predominately in the region of the sigmoid colon, without  surrounding inflammatory changes to suggest acute diverticulitis at this time.  Normal appendix.  Uterus, the bilateral ovaries and urinary bladder are unremarkable in appearance.  Musculoskeletal: Irregularity of the anterior aspect of the right ilium is most likely to be post traumatic. There are no aggressive appearing lytic or blastic lesions noted in the visualized portions of the skeleton.  IMPRESSION: 1.  Interval development of common bile duct dilatation and moderate intrahepatic biliary ductal dilatation, with inflammatory changes in the gallbladder fossa (status post cholecystectomy) and porta hepatis.  Findings are concerning for inflammatory process such as cholangitis. 2.  Marked interval increase in amount of pneumobilia compared to the prior examination (the [name] has reportedly had prior common bile duct stent placements and has therefore had a sphincterotomy). 3.  Moderate cardiomegaly with severe left atrial dilatation.  The [name] is status post mechanical mitral valve replacement. 4.  New splenomegaly. 5.  Small hiatal hernia. 6.  Colonic diverticulosis without findings to suggest acute diverticulitis.  These results were called by telephone on 08/14/2011  at  02:00 p.m. to  Dr. Weldon Inches, who verbally acknowledged these results.  Original Report Authenticated By: Florencia Reasons, M.D.   Dg Abd Acute W/chest  08/14/2011  *RADIOLOGY REPORT*  Clinical Data: Epigastric pain and nausea  ACUTE ABDOMEN SERIES (ABDOMEN 2 VIEW & CHEST 1 VIEW)  Comparison: August 21, 2007 and April 26, 2010  Findings: Mild cardiomegaly is unchanged.  There is evidence of prior valve replacement surgery, stable in appearance.  The mediastinum and pulmonary vasculature are within normal limits. Both lungs are clear.  The stool and bowel gas pattern is within normal limits with no evidence of obstruction.  No pneumoperitoneum.  There is no gross organomegaly.  No abnormal calcifications are present over the  abdomen or pelvis.  No osseous lesions.  IMPRESSION: Stable, mild cardiomegaly.  Normal stool and bowel gas pattern.  Original Report Authenticated By: Brandon Melnick, M.D.   EKG: Atrial fibrillation with ventricular rate at 50 beats per minute, normal axis, no acute ischemic changes and QTC is 434 ms.   Assessment/Plan Present on Admission:  .Abdominal pain, acute, epigastric .Choledocholithiasis .Acute cholangitis .Atrial fibrillation .GERD .HYPERTENSION, UNSPECIFIED  1. Acute abdominal pain (epigastric/right upper quadrant): Differential diagnosis includes choledocholithiasis with passage of stones versus acute cholangitis. No fevers or leukocytosis argues against cholangitis. [name] has abnormal CT of the abdomen as indicated above and mild transaminitis. Will admit to telemetry. Placed on clear liquids. Continue IV Zosyn. Place on IV Protonix. [facility] gastroenterology has been consulted. We'll hold Coumadin in case [name] requires procedures such as ERCP tomorrow. 2. Status post mechanical mitral valve replacement/St. Jude's valve: Hold Coumadin and initiate heparin infusion if her INR drops below 2.5 which can be turned off prior  to procedure. 3. Atrial fibrillation with controlled ventricular rate and therapeutically anticoagulated. Continue metoprolol. 4. Anemia and thrombocytopenia: Monitor daily CBCs. 5. Hypertension: Controlled 6. Mild dehydration: Continue IV fluids  7. Full code.  [name]'s care was discussed in detail with her sister who was at the bedside, at the [name]'s request.   [name] 08/14/2011, 6:09 PM

## 2011-08-14 NOTE — ED Notes (Signed)
Pt complaining of epigastric pain since this morning.  States she had a bowel movement today around 0700 and it was normal.  Rates pain 9/10 - Pain started when she was driving on her way to work.  Stated she had this pain before and was told she had a bile duct obstruction and was diagnosed with jaundice est. 2009

## 2011-08-14 NOTE — ED Notes (Signed)
Pt finished contrast dye, CT notified.

## 2011-08-14 NOTE — ED Provider Notes (Cosign Needed Addendum)
History     CSN: [id number]  Arrival date & time 08/14/11  0865   First MD Initiated Contact with Patient 08/14/11 [phone]      Chief Complaint  Patient presents with  . Abdominal Pain    (Consider location/radiation/quality/duration/timing/severity/associated sxs/prior treatment) Patient is a 56 y.o. female presenting with abdominal pain. The history is provided by the patient and a relative.  Abdominal Pain The primary symptoms of the illness include abdominal pain and nausea. The primary symptoms of the illness do not include fever, shortness of breath, vomiting, diarrhea or dysuria.  Symptoms associated with the illness do not include chills.   since is a 56 year old, female, with a history of cholecystectomy, atrial fibrillation, and mitral valve replacement, who comes emergency department complaining of epigastric pain that radiates to her back, with nausea.  Her symptoms began.  This morning, when she was driving to work.  They have been constant.  She denies vomiting, diarrhea, cough, shortness of breath, urinary tract symptoms.  She has not had a fever.  She denies use of alcohol.  She has never had peptic ulcer disease, or pancreatitis in the past.  Past Medical History  Diagnosis Date  . Atrial fibrillation   . Rheumatic heart disease   . HTN (hypertension)     unspecified  . Pulmonary HTN     mild  . Hypercholesterolemia   . GERD (gastroesophageal reflux disease)   . Goiter     multinodular    Past Surgical History  Procedure Date  . Wrist arthroscopy   . Endoscopic retrograde cholangiopancreatography with stent placement and stone removal   . Carpal tunnel release   . Cardiac valve replacement   . Cardiac catheterization     Family History  Problem Relation Age of Onset  . Hypothyroidism Sister   . Gallbladder disease Sister   . Coronary artery disease      History  Substance Use Topics  . Smoking status: Never Smoker   . Smokeless tobacco: Not on file    . Alcohol Use: No    OB History    Grav Para Term Preterm Abortions TAB SAB Ect Mult Living                  Review of Systems  Constitutional: Negative for fever and chills.  Respiratory: Negative for cough, chest tightness and shortness of breath.   Cardiovascular: Negative for chest pain.  Gastrointestinal: Positive for nausea and abdominal pain. Negative for vomiting and diarrhea.  Genitourinary: Negative for dysuria.  Neurological: Negative for headaches.  Psychiatric/Behavioral: Negative for confusion.  All other systems reviewed and are negative.    Allergies  Review of patient's allergies indicates no known allergies.  Home Medications   Current Outpatient Rx  Name Route Sig Dispense Refill  . ATORVASTATIN CALCIUM 40 MG PO TABS Oral Take 1 tablet (40 mg total) by mouth daily. 30 tablet 11  . DIGOXIN 0.25 MG PO TABS Oral Take 1 tablet (250 mcg total) by mouth daily. 30 tablet 11  . METOPROLOL SUCCINATE ER 25 MG PO TB24  TAKE ONE TABLET BY MOUTH EVERY DAY 30 tablet 12  . OMEPRAZOLE 20 MG PO CPDR Oral Take 20 mg by mouth daily.      . WARFARIN SODIUM 5 MG PO TABS  Take as directed by Anticoagulation clinic  35 tablet 3    BP 147/71  Pulse 45  Temp(Src) 97.4 F (36.3 C) (Oral)  Resp 15  SpO2 100%  Physical  Exam  Vitals reviewed. Constitutional: She is oriented to person, place, and time. She appears well-developed and well-nourished. No distress.       Uncomfortable appearing  HENT:  Head: Normocephalic and atraumatic.  Eyes: Pupils are equal, round, and reactive to light.  Neck: Normal range of motion.  Cardiovascular: Intact distal pulses.   Murmur heard.      Bradycardia irregular heartbeat  Pulmonary/Chest: Effort normal and breath sounds normal. No respiratory distress. She has no wheezes. She has no rales.  Abdominal: Soft. She exhibits no distension and no mass. There is tenderness. There is no rebound and no guarding.       Epigastric and right  upper quadrant tenderness without peritoneal signs  Musculoskeletal: Normal range of motion. She exhibits no edema and no tenderness.  Neurological: She is alert and oriented to person, place, and time. No cranial nerve deficit.  Skin: Skin is warm and dry. No rash noted. No erythema.  Psychiatric: She has a normal mood and affect. Her behavior is normal.    ED Course  Procedures (including critical care time) D56-year-old, female epigastric pain radiating to her back for the past few hours.  It is accompanied by nausea, with no other symptoms.  She has a history of cholecystectomy.  She denies alcohol use or prior episode of peptic ulcer disease, or pancreatitis.  We will establish an IV and treat her symptoms and perform laboratory testing, and an acute abdominal series looking for bowel obstruction, should it be negative.  We will proceed with doing a CAT scan of her abdomen.  ED ECG REPORT   Date: 08/14/2011  EKG Time: 9:13 AM  Rate: 50   Rhythm: atrial fibrillation,    Axis: nl  Intervals:none  ST&T Change: nonspecific tw changes  Narrative Interpretation: afib with nonspecific tw changes            Labs Reviewed  CBC  DIFFERENTIAL  COMPREHENSIVE METABOLIC PANEL  LIPASE, BLOOD  DIGOXIN LEVEL   No results found.   No diagnosis found.  2:40 PM I spoke with gen surgery.  They will see pt for possible cholangitis.  3:54 PM gen surgery said since no GB, call medicine and GI  4:02 PM i spoke with dr. Elnoria Howard.  He will do a consult.   MDM  Abdominal pain        Nicholes Stairs, MD 08/14/11 1555  Nicholes Stairs, MD 08/14/11 [phone]

## 2011-08-14 NOTE — Consult Note (Signed)
Reason for Consult:Possible Choleangitis Referring Physician: ER-MD Dr. Weldon Inches Primary care: Dr.  Loma Sender Cardiology: Dr. Darryl Nestle Cardiology GI: [name]                                                                                                                                                                                      [name] is an 55 y.o. female.  HPI: Patient is a 56 year old white female who's been in her normal state of health. She said she was fine yesterday. Today while driving to work she had acute onset of pain in her right upper quadrant. She notes that she was fine yesterday. She denies GERD symptoms nausea vomiting diarrhea constipation or blood in her stool. She has a history of cholecystectomy 2009. She previously undergone ERCP and stent placement by GI. She had her last colonoscopy last year with [name].  She presented to the emergency room at [facility]. Pain improved with analgesia, but reoccurred after drinking contrast for the CT. Workup in the emergency room shows a WBC of 9700. Hemoglobin 11.2 hematocrit 35.9 platelets are 146,000. Total bilirubin is 0.09 alkaline phosphatase 113 AST 114 ALT 59. CT scan shows interval development of a common bile duct dilatation and moderate intrahepatic biliary ductal dilatation with laboratory changes in the gallbladder fossa at cholecystectomy and porta hepatis concerning for cholangitis. There is marked interval increase in pneumobilia compared to prior exams, there is new splenomegaly. And a hiatal hernia. There is colonic diverticulosis without suggestions of acute diverticulitis. We were asked to see in consultation.  Past Medical History  Diagnosis Date  . Atrial fibrillation Hx of DCCV Chronic coumadin   . Rheumatic heart disease/Mitral valve Replacement 1997 [facility] Mechanical Valve Dr. Donata Clay   . HTN (hypertension)     unspecified  . Pulmonary HTN     mild  .  Hypercholesterolemia   . GERD (gastroesophageal reflux disease)   . Goiter     multinodular    Past Surgical History  Procedure Date  . Wrist arthroscopy   . Endoscopic retrograde cholangiopancreatography with stent placement and  stone removal Cholecystectomy 2008/2009   . Carpal tunnel release   . Cardiac valve replacement   . Cardiac catheterization     Family History  Problem Relation Age of Onset  . Hypothyroidism Sister   . Gallbladder disease Sister   . Coronary artery disease      Social History:  reports that she has never smoked. She does not have any smokeless tobacco history on file. She reports that she does not drink alcohol or use illicit drugs.  Allergies: No  Known Allergies  Medications:  Prior to Admission:  (Not in a hospital admission) Scheduled:   . gi cocktail  30 mL Oral Once  . iohexol  20 mL Oral Q1 Hr x 2  . morphine  4 mg Intravenous Once  . morphine  4 mg Intravenous Once  . ondansetron  4 mg Intravenous Once  . piperacillin-tazobactam (ZOSYN)  IV  3.375 g Intravenous Once   Continuous:   . sodium chloride 1,000 mL (08/14/11 1115)   ZOX:[id number]  Results for orders placed during the hospital encounter of 08/14/11 (from the past 48 hour(s))  CBC     Status: Abnormal   Collection Time   08/14/11  9:54 AM      Component Value Range Comment   WBC 9.7  4.0 - 10.5 (K/uL)    RBC 4.07  3.87 - 5.11 (MIL/uL)    Hemoglobin 11.2 (*) 12.0 - 15.0 (g/dL)    HCT 09.8 (*) 11.9 - 46.0 (%)    MCV 88.2  78.0 - 100.0 (fL)    MCH 27.5  26.0 - 34.0 (pg)    MCHC 31.2  30.0 - 36.0 (g/dL)    RDW 14.7  82.9 - 56.2 (%)    Platelets 146 (*) 150 - 400 (K/uL)   DIFFERENTIAL     Status: Abnormal   Collection Time   08/14/11  9:54 AM      Component Value Range Comment   Neutrophils Relative 89 (*) 43 - 77 (%)    Neutro Abs 8.6 (*) 1.7 - 7.7 (K/uL)    Lymphocytes Relative 7 (*) 12 - 46 (%)    Lymphs Abs 0.7  0.7 - 4.0 (K/uL)    Monocytes Relative 4  3 - 12  (%)    Monocytes Absolute 0.4  0.1 - 1.0 (K/uL)    Eosinophils Relative 0  0 - 5 (%)    Eosinophils Absolute 0.0  0.0 - 0.7 (K/uL)    Basophils Relative 0  0 - 1 (%)    Basophils Absolute 0.0  0.0 - 0.1 (K/uL)   COMPREHENSIVE METABOLIC PANEL     Status: Abnormal   Collection Time   08/14/11  9:54 AM      Component Value Range Comment   Sodium 141  135 - 145 (mEq/L)    Potassium 3.8  3.5 - 5.1 (mEq/L)    Chloride 105  96 - 112 (mEq/L)    CO2 26  19 - 32 (mEq/L)    Glucose, Bld 154 (*) 70 - 99 (mg/dL)    BUN 12  6 - 23 (mg/dL)    Creatinine, Ser 1.30  0.50 - 1.10 (mg/dL)    Calcium 8.9  8.4 - 10.5 (mg/dL)    Total Protein 6.9  6.0 - 8.3 (g/dL)    Albumin 3.1 (*) 3.5 - 5.2 (g/dL)    AST 865 (*) 0 - 37 (U/L)    ALT 59 (*) 0 - 35 (U/L)    Alkaline Phosphatase 113  39 - 117 (U/L)    Total Bilirubin 0.9  0.3 - 1.2 (mg/dL)    GFR calc non Af Amer >90  >90 (mL/min)    GFR calc Af Amer >90  >90 (mL/min)   LIPASE, BLOOD     Status: Normal   Collection Time   08/14/11  9:54 AM      Component Value Range Comment   Lipase 37  11 - 59 (U/L)   DIGOXIN LEVEL     Status:  Normal   Collection Time   08/14/11  9:54 AM      Component Value Range Comment   Digoxin Level 1.3  0.8 - 2.0 (ng/mL)   PROTIME-INR     Status: Abnormal   Collection Time   08/14/11  9:54 AM      Component Value Range Comment   Prothrombin Time 30.5 (*) 11.6 - 15.2 (seconds)    INR 2.87 (*) 0.00 - 1.49      Ct Abdomen Pelvis W Contrast  08/14/2011  *RADIOLOGY REPORT*  Clinical Data: Epigastric pain.  CT ABDOMEN AND PELVIS WITH CONTRAST  Technique:  Multidetector CT imaging of the abdomen and pelvis was performed following the standard protocol during bolus administration of intravenous contrast.  Contrast: 80mL OMNIPAQUE IOHEXOL 300 MG/ML IV SOLN  Comparison: CT of abdomen and pelvis 03/10/2008.  Findings:  Lung Bases: Dependent atelectasis in the lower lobe to the lungs bilaterally.  Heart is moderately enlarged (with severe  dilatation of the left atrium).  Status post mechanical mitral valve replacement. There is a small hiatal hernia.  Abdomen/Pelvis:  Compared to the prior examination there is extensive pneumobilia (although the gas extends into the periphery of the hepatic parenchyma, it is consistently noted adjacent to branches of the portal veins, and is not consistent with portal venous gas).  Mild - moderate intrahepatic biliary ductal dilatation is present, new compared to the prior examination.  As well, the common bile duct measures up to 17 mm in the porta hepatis.  The patient status post cholecystectomy.  Notably, in the gallbladder fossa and porta hepatis there is ill-defined fluid attenuation and stranding, suggesting inflammation, potentially from cholangitis.  The portal vein is widely patent as is the SMV. Pancreas is unremarkable in appearance.  Specifically, no inflammatory changes and no pancreatic ductal dilatation. Splenomegaly (16.8 cm AP) is new compared to the prior examination. The diffuse appearance of the adrenal glands and kidneys bilaterally is unremarkable.  No ascites or pneumoperitoneum and no pathologic distension of bowel.  No pathologic lymphadenopathy.  Numerous colonic diverticula are present, predominately in the region of the sigmoid colon, without surrounding inflammatory changes to suggest acute diverticulitis at this time.  Normal appendix.  Uterus, the bilateral ovaries and urinary bladder are unremarkable in appearance.  Musculoskeletal: Irregularity of the anterior aspect of the right ilium is most likely to be post traumatic. There are no aggressive appearing lytic or blastic lesions noted in the visualized portions of the skeleton.  IMPRESSION: 1.  Interval development of common bile duct dilatation and moderate intrahepatic biliary ductal dilatation, with inflammatory changes in the gallbladder fossa (status post cholecystectomy) and porta hepatis.  Findings are concerning for  inflammatory process such as cholangitis. 2.  Marked interval increase in amount of pneumobilia compared to the prior examination (the patient has reportedly had prior common bile duct stent placements and has therefore had a sphincterotomy). 3.  Moderate cardiomegaly with severe left atrial dilatation.  The patient is status post mechanical mitral valve replacement. 4.  New splenomegaly. 5.  Small hiatal hernia. 6.  Colonic diverticulosis without findings to suggest acute diverticulitis.  These results were called by telephone on 08/14/2011  at  02:00 p.m. to  Dr. Weldon Inches, who verbally acknowledged these results.  Original Report Authenticated By: Florencia Reasons, M.D.   Dg Abd Acute W/chest  08/14/2011  *RADIOLOGY REPORT*  Clinical Data: Epigastric pain and nausea  ACUTE ABDOMEN SERIES (ABDOMEN 2 VIEW & CHEST 1 VIEW)  Comparison: August 21, 2007 and April 26, 2010  Findings: Mild cardiomegaly is unchanged.  There is evidence of prior valve replacement surgery, stable in appearance.  The mediastinum and pulmonary vasculature are within normal limits. Both lungs are clear.  The stool and bowel gas pattern is within normal limits with no evidence of obstruction.  No pneumoperitoneum.  There is no gross organomegaly.  No abnormal calcifications are present over the abdomen or pelvis.  No osseous lesions.  IMPRESSION: Stable, mild cardiomegaly.  Normal stool and bowel gas pattern.  Original Report Authenticated By: Brandon Melnick, M.D.    Review of Systems  Constitutional: Negative.   HENT: Negative.   Eyes: Negative.   Respiratory: Positive for shortness of breath. Negative for cough, hemoptysis, sputum production and wheezing.   Cardiovascular: Positive for orthopnea. Negative for chest pain, palpitations, claudication and leg swelling.  Gastrointestinal: Negative for heartburn, nausea and vomiting. Abdominal pain: RUQ came on acutely today driving to work.  Genitourinary: Negative.     Musculoskeletal: Negative.   Skin: Negative.   Neurological: Negative.   Endo/Heme/Allergies: Bruises/bleeds easily (on chronic coumadin).  Psychiatric/Behavioral: Negative.    Blood pressure 138/65, pulse 77, temperature 97.4 F (36.3 C), temperature source Oral, resp. rate 24, SpO2 96.00%. Physical Exam  Constitutional: She is oriented to person, place, and time. She appears well-developed and well-nourished. No distress.  Cardiovascular: Normal rate and regular rhythm.   Murmur heard.      Mechanical Mitral Valve  Respiratory: Effort normal and breath sounds normal. No respiratory distress. She has no wheezes. She has no rales. She exhibits no tenderness.       Well healed sternotomy.    GI: Soft. She exhibits no distension and no mass. There is tenderness (RUQ going to back.). There is no rebound and no guarding.       BS, slightly hyperactive.  Musculoskeletal: Normal range of motion. She exhibits no edema and no tenderness.  Neurological: She is alert and oriented to person, place, and time. She has normal reflexes. No cranial nerve deficit.  Skin: Skin is warm and dry.       Feels warm no fever recorded.  Psychiatric: She has a normal mood and affect. Her behavior is normal. Judgment and thought content normal.    Assessment/Plan: 1.Abdominal pain with new Common bile duct dilatation, and intrahepatic ductal dilatation. Inflammatory changes of the Gallbladder fossa.  S/P Cholecystectomy 2009. 2. New Splenomegaly 3.Hiatal Hernia 5.Diverticlosis, without diverticulitis. 6. . Atrial fibrillation Hx of DCCV Chronic coumadin   . Rheumatic heart disease/Mitral valve Replacement 1997 [facility] Mechanical Valve Dr. Donata Clay   . HTN (hypertension)     unspecified  . Pulmonary HTN     mild  . Hypercholesterolemia   . GERD (gastroesophageal reflux disease)   . Goiter     multinodular    Plan:  Discuss with DR. Biagio Quint and refer to Medicine and  GI.  Sherrie George 08/14/2011, 4:16 PM   RUQ pain of uncertain etiology.  S/p cholecystectomy.  I am not sure what surgery can offer at this point.  Recommend GI evaluation.

## 2011-08-14 NOTE — ED Notes (Signed)
[id number] Ready 

## 2011-08-14 NOTE — ED Notes (Signed)
Dr. Waymon Amato at bedside for evaluation of the pt for admission.

## 2011-08-15 ENCOUNTER — Encounter (HOSPITAL_COMMUNITY)
Admission: EM | Disposition: A | Discharge status: Home / Self Care | Payer: Self-pay | Source: Ambulatory Visit | Attending: Internal Medicine

## 2011-08-15 ENCOUNTER — Inpatient Hospital Stay (HOSPITAL_COMMUNITY): Payer: BC Managed Care – PPO

## 2011-08-15 ENCOUNTER — Encounter (HOSPITAL_COMMUNITY): Payer: Self-pay | Admitting: Gastroenterology

## 2011-08-15 DIAGNOSIS — K8309 Other cholangitis: Secondary | ICD-10-CM

## 2011-08-15 HISTORY — PX: ERCP: SHX5425

## 2011-08-15 LAB — CBC
Hemoglobin: 10.7 g/dL — ABNORMAL LOW (ref 12.0–15.0)
MCH: 28.2 pg (ref 26.0–34.0)
MCHC: 32.6 g/dL (ref 30.0–36.0)
MCV: 86.3 fL (ref 78.0–100.0)
RBC: 3.8 MIL/uL — ABNORMAL LOW (ref 3.87–5.11)

## 2011-08-15 LAB — COMPREHENSIVE METABOLIC PANEL
BUN: 10 mg/dL (ref 6–23)
CO2: 24 mEq/L (ref 19–32)
Calcium: 8.5 mg/dL (ref 8.4–10.5)
Creatinine, Ser: 0.87 mg/dL (ref 0.50–1.10)
GFR calc Af Amer: 83 mL/min — ABNORMAL LOW (ref >90)
GFR calc non Af Amer: 72 mL/min — ABNORMAL LOW (ref >90)
Glucose, Bld: 123 mg/dL — ABNORMAL HIGH (ref 70–99)
Total Protein: 6.3 g/dL (ref 6.0–8.3)

## 2011-08-15 LAB — PROTIME-INR
INR: 2.81 — ABNORMAL HIGH (ref 0.00–1.49)
Prothrombin Time: 30 seconds — ABNORMAL HIGH (ref 11.6–15.2)

## 2011-08-15 LAB — HEPARIN LEVEL (UNFRACTIONATED)
Heparin Unfractionated: <0.1 IU/mL — ABNORMAL LOW (ref 0.30–0.70)
Heparin Unfractionated: <0.1 IU/mL — ABNORMAL LOW (ref 0.30–0.70)

## 2011-08-15 LAB — TYPE AND SCREEN
ABO/RH(D): A NEG
DAT, IgG: NEGATIVE

## 2011-08-15 SURGERY — ERCP, WITH INTERVENTION IF INDICATED
Anesthesia: Moderate Sedation

## 2011-08-15 MED ORDER — MIDAZOLAM HCL 10 MG/2ML IJ SOLN
INTRAMUSCULAR | Status: DC | PRN
  Administered 2011-08-15 (×6): 2.5 mg via INTRAVENOUS

## 2011-08-15 MED ORDER — FENTANYL CITRATE 0.05 MG/ML IJ SOLN
INTRAMUSCULAR | Status: AC
  Filled 2011-08-15: qty 4

## 2011-08-15 MED ORDER — DIPHENHYDRAMINE HCL 50 MG/ML IJ SOLN
INTRAMUSCULAR | Status: AC
  Filled 2011-08-15: qty 1

## 2011-08-15 MED ORDER — FENTANYL CITRATE 0.05 MG/ML IJ SOLN
INTRAMUSCULAR | Status: DC | PRN
  Administered 2011-08-15 (×5): 25 ug via INTRAVENOUS

## 2011-08-15 MED ORDER — HEPARIN SOD (PORCINE) IN D5W 100 UNIT/ML IV SOLN
1550.0000 [IU]/h | INTRAVENOUS | Status: DC
  Administered 2011-08-15 – 2011-08-16 (×2): 1200 [IU]/h via INTRAVENOUS
  Filled 2011-08-15 (×3): qty 250

## 2011-08-15 MED ORDER — SODIUM CHLORIDE 0.9 % IV SOLN: Freq: Once | INTRAVENOUS | Status: DC

## 2011-08-15 MED ORDER — SODIUM CHLORIDE 0.9 % IV SOLN
INTRAVENOUS | Status: DC | PRN
  Administered 2011-08-15: 14:00:00

## 2011-08-15 MED ORDER — BUTAMBEN-TETRACAINE-BENZOCAINE 2-2-14 % EX AERO
INHALATION_SPRAY | CUTANEOUS | Status: DC | PRN
  Administered 2011-08-15: 2 via TOPICAL

## 2011-08-15 MED ORDER — GLUCAGON HCL (RDNA) 1 MG IJ SOLR
INTRAMUSCULAR | Status: AC
  Filled 2011-08-15: qty 2

## 2011-08-15 MED ORDER — MIDAZOLAM HCL 10 MG/2ML IJ SOLN
INTRAMUSCULAR | Status: AC
  Filled 2011-08-15: qty 4

## 2011-08-15 NOTE — Progress Notes (Signed)
  ANTICOAGULATION CONSULT NOTE - Follow Up Consult Pharmacy Consult for Heparin  Indication: [employer] Jude mitral valve  No Known Allergies  Patient Measurements: Height: 5\' 4"  (162.6 cm) Weight: 175 lb 14.8 oz (79.8 kg) IBW/kg (Calculated) : 54.7  Heparin Dosing Weight: 72kg  Vital Signs: Temp: 98.2 F (36.8 C) (02/22 1522) Temp src: Oral (02/22 1522) BP: 129/73 mmHg (02/22 1522) Pulse Rate: 63  (02/22 1522)  Labs:  Basename 08/15/11 1041 08/15/11 0146 08/14/11 0954  HGB -- 10.7* 11.2*  HCT -- 32.8* 35.9*  PLT -- 126* 146*  APTT -- -- --  LABPROT -- 30.0* 30.5*  INR -- 2.81* 2.87*  HEPARINUNFRC <0.10* <0.10* --  CREATININE -- 0.87 0.72  CKTOTAL -- -- --  CKMB -- -- --  TROPONINI -- -- --   Estimated Creatinine Clearance: 72 ml/min (by C-G formula based on Cr of 0.87).  Assessment: Anticoagulation: St. Jude mitral valve/afib, coumadin PTA, goal INR 3-3.5, INR subtherapeutic since admission (2.8). Was put on heparin over night, d/c'd this morning for endoscopy. Called Dr. Benjamine Mola, will resume IV heparin for now. And f/u with GI later for resuming coumadin. Previous heparin rate was 1200 units/hr, last level < 0.1 but infusion turned off before level drawn. Cbc has been stable.   Goal of Therapy:  Heparin level 0.3-0.7 units/ml   Plan:  - resume heparin gtt at 1200 units/hr - heparin level tonight at 2300 - f/u daily cbc while pt. On heparin.  Riki Rusk 08/15/2011,4:24 PM

## 2011-08-15 NOTE — Progress Notes (Signed)
ANTICOAGULATION CONSULT NOTE   Pharmacy Consult for Heparin Indication: St. Jude mitral valve  No Known Allergies  Patient Measurements: Height: 5\' 4"  (162.6 cm) Weight: 175 lb 14.8 oz (79.8 kg) IBW/kg (Calculated) : 54.7   Vital Signs: Temp: 98.2 F (36.8 C) (02/21 2011) Temp src: Oral (02/21 2011) BP: 136/75 mmHg (02/21 2011) Pulse Rate: 80  (02/21 2011)  Labs:  Basename 08/15/11 0146 08/14/11 0954  HGB 10.7* 11.2*  HCT 32.8* 35.9*  PLT 126* 146*  APTT -- --  LABPROT 30.0* 30.5*  INR 2.81* 2.87*  HEPARINUNFRC <0.10* --  CREATININE 0.87 0.72  CKTOTAL -- --  CKMB -- --  TROPONINI -- --   Estimated Creatinine Clearance: 72 ml/min (by C-G formula based on Cr of 0.87).  Assessment: 56 y.o Female with h/o afib and St. Jude's MVR, Coumadin on hold for ERCP, for Heparin   Goal of Therapy:  Heparin level 0.3-0.7 units/ml INR 3-3.5   Plan:  Increase Heparin 1200 units/hr Check heparin level in 8 hours.  [name], [name] 08/15/2011,2:54 AM

## 2011-08-15 NOTE — Op Note (Signed)
[facility] [address] [place], [place]  [address]  ERCP PROCEDURE REPORT  PATIENT:  [name], [name]  MR#:  [id number] BIRTHDATE:  [birth year]  GENDER:  female  ENDOSCOPIST:  Vida Rigger, MD ASSISTANT:  Olene Craven  PROCEDURE DATE:  08/15/2011 PROCEDURE:  ERCP with removal of stones ASA CLASS:  Class II  INDICATIONS:  probable cholangitis  MEDICATIONS:  150 mcg fentanyl 15 mg Versed TOPICAL ANESTHETIC: Used  DESCRIPTION OF PROCEDURE:   After the risks benefits and alternatives of the procedure were thoroughly explained, informed consent was obtained.  The PENTAX [id number] and [id number]) endoscope was introduced through the mouth and advanced to the second portion of the duodenum. The previous sphincterotomy site was brought into view with some minimal bile seen draining. The triple-lumen sphincterotome loaded with the JAG Jagwire was used and deep selective cannulation was obtained on the first attempt. There was no PD injections or wires advancement throughout the procedure. On the initial cholangiogram possibly a stone was seen in the CBD. The sphincterotome was exchanged for the adjustable 12-15 mm  balloon and 2 small stones were removed on the first two balloon pull-throughs. The 12 mm balloon passed readily through the pain sphincterotomy site but there was some resistance in trying to pass the 15 mm balloon so it was lowered to 12. And occlusion cholangiogram was done which did not reveal any abnormalities in the right system but there was probably some air in the left system however we placed the wire in the left system and then proceeded with 2 more balloon pull-through at 12 mm of the left system and another occlusion cholangiogram without additional findings. There was no signs of obstruction or significant cholangitis and we elected not to place a stent since there was adequate although sluggish at the end of the  procedure biliary drainage. The examination was normal with no abnormal endoscopic findings.  The scope was then completely withdrawn from the patient and the procedure terminated after the wire and balloon were withdrawn. The patient tolerated the procedure well and there was no signs of bleeding <<PROCEDUREIMAGES>>  COMPLICATIONS:  A complication of none occurred on 08/15/2011 at.  ENDOSCOPIC IMPRESSION: 1. Patent previous sphincterotomy site 2. No PD injections or wires advancement 3. 2 small stones removed 4. No signs of obstruction or cholangitis and seemingly adequate biliary drainage  RECOMMENDATIONS: Observe for a delayed complications follow labs continue antibiotics and if doing well tomorrow hopefully we can advance diet and I will have my partner Dr. Bosie Clos check on tomorrow  ______________________________ Vida Rigger, MD  CC:  n. eSIGNEDVida Rigger at 08/15/2011 01:55 PM  Christean Leaf, [phone]

## 2011-08-15 NOTE — Progress Notes (Signed)
Utilization review complete 

## 2011-08-15 NOTE — Progress Notes (Signed)
Pt noted to have multiple pauses on tele between 2.11 and 2.55 with increasing frequency this am. Heart rate 55-60. Notified Dr. Benjamine Mola. No digoxin given today due to procedure. Will continue to monitor.

## 2011-08-15 NOTE — Progress Notes (Signed)
Subjective: Afebrile, Feels much better, No pain,LFT'S ,  GI to see today. Objective: Vital signs in last 24 hours: Temp:  [97.4 F (36.3 C)-98.3 F (36.8 C)] 98.1 F (36.7 C) (02/22 0532) Pulse Rate:  [45-96] 57  (02/22 0532) Resp:  [12-24] 18  (02/22 0532) BP: (106-160)/(51-102) 125/66 mmHg (02/22 0532) SpO2:  [91 %-100 %] 96 % (02/22 0532) Weight:  [79.8 kg (175 lb 14.8 oz)] 79.8 kg (175 lb 14.8 oz) (02/21 2011) Last BM Date: 08/14/11  Intake/Output from previous day: 02/21 0701 - 02/22 0700 In: 858.3 [I.V.:808.3; IV Piggyback:50] Out: 1 [Urine:1] Intake/Output this shift:    PE: Alert, no pain today, Abd: soft no distension. +BS.  Lab Results:   [facility] 08/15/11 0146 08/14/11 0954  WBC 11.6* 9.7  HGB 10.7* 11.2*  HCT 32.8* 35.9*  PLT 126* 146*   BMET  Basename 08/15/11 0146 08/14/11 0954  NA 134* 141  K 3.9 3.8  CL 102 105  CO2 24 26  GLUCOSE 123* 154*  BUN 10 12  CREATININE 0.87 0.72  CALCIUM 8.5 8.9     Lab 08/15/11 0146 08/14/11 0954  AST 154* 114*  ALT 137* 59*  ALKPHOS 134* 113  BILITOT 3.3* 0.9  PROT 6.3 6.9  ALBUMIN 2.8* 3.1*    PT/INR  Basename 08/15/11 0146 08/14/11 0954  LABPROT 30.0* 30.5*  INR 2.81* 2.87*     Studies/Results: Ct Abdomen Pelvis W Contrast  08/14/2011  *RADIOLOGY REPORT*  Clinical Data: Epigastric pain.  CT ABDOMEN AND PELVIS WITH CONTRAST  Technique:  Multidetector CT imaging of the abdomen and pelvis was performed following the standard protocol during bolus administration of intravenous contrast.  Contrast: 80mL OMNIPAQUE IOHEXOL 300 MG/ML IV SOLN  Comparison: CT of abdomen and pelvis 03/10/2008.  Findings:  Lung Bases: Dependent atelectasis in the lower lobe to the lungs bilaterally.  Heart is moderately enlarged (with severe dilatation of the left atrium).  Status post mechanical mitral valve replacement. There is a small hiatal hernia.  Abdomen/Pelvis:  Compared to the prior examination there is extensive  pneumobilia (although the gas extends into the periphery of the hepatic parenchyma, it is consistently noted adjacent to branches of the portal veins, and is not consistent with portal venous gas).  Mild - moderate intrahepatic biliary ductal dilatation is present, new compared to the prior examination.  As well, the common bile duct measures up to 17 mm in the porta hepatis.  The patient status post cholecystectomy.  Notably, in the gallbladder fossa and porta hepatis there is ill-defined fluid attenuation and stranding, suggesting inflammation, potentially from cholangitis.  The portal vein is widely patent as is the SMV. Pancreas is unremarkable in appearance.  Specifically, no inflammatory changes and no pancreatic ductal dilatation. Splenomegaly (16.8 cm AP) is new compared to the prior examination. The diffuse appearance of the adrenal glands and kidneys bilaterally is unremarkable.  No ascites or pneumoperitoneum and no pathologic distension of bowel.  No pathologic lymphadenopathy.  Numerous colonic diverticula are present, predominately in the region of the sigmoid colon, without surrounding inflammatory changes to suggest acute diverticulitis at this time.  Normal appendix.  Uterus, the bilateral ovaries and urinary bladder are unremarkable in appearance.  Musculoskeletal: Irregularity of the anterior aspect of the right ilium is most likely to be post traumatic. There are no aggressive appearing lytic or blastic lesions noted in the visualized portions of the skeleton.  IMPRESSION: 1.  Interval development of common bile duct dilatation and moderate intrahepatic biliary  ductal dilatation, with inflammatory changes in the gallbladder fossa (status post cholecystectomy) and porta hepatis.  Findings are concerning for inflammatory process such as cholangitis. 2.  Marked interval increase in amount of pneumobilia compared to the prior examination (the patient has reportedly had prior common bile duct stent  placements and has therefore had a sphincterotomy). 3.  Moderate cardiomegaly with severe left atrial dilatation.  The patient is status post mechanical mitral valve replacement. 4.  New splenomegaly. 5.  Small hiatal hernia. 6.  Colonic diverticulosis without findings to suggest acute diverticulitis.  These results were called by telephone on 08/14/2011  at  02:00 p.m. to  Dr. Weldon Inches, who verbally acknowledged these results.  Original Report Authenticated By: Florencia Reasons, M.D.   Dg Abd Acute W/chest  08/14/2011  *RADIOLOGY REPORT*  Clinical Data: Epigastric pain and nausea  ACUTE ABDOMEN SERIES (ABDOMEN 2 VIEW & CHEST 1 VIEW)  Comparison: August 21, 2007 and April 26, 2010  Findings: Mild cardiomegaly is unchanged.  There is evidence of prior valve replacement surgery, stable in appearance.  The mediastinum and pulmonary vasculature are within normal limits. Both lungs are clear.  The stool and bowel gas pattern is within normal limits with no evidence of obstruction.  No pneumoperitoneum.  There is no gross organomegaly.  No abnormal calcifications are present over the abdomen or pelvis.  No osseous lesions.  IMPRESSION: Stable, mild cardiomegaly.  Normal stool and bowel gas pattern.  Original Report Authenticated By: Brandon Melnick, M.D.    Anti-infectives: Anti-infectives     Start     Dose/Rate Route Frequency Ordered Stop   08/15/11 0100  piperacillin-tazobactam (ZOSYN) IVPB 3.375 g       3.375 g 12.5 mL/hr over 240 Minutes Intravenous Every 8 hours 08/14/11 1922     08/14/11 1615  piperacillin-tazobactam (ZOSYN) IVPB 3.375 g       3.375 g 12.5 mL/hr over 240 Minutes Intravenous  Once 08/14/11 1604 08/14/11 2038         Current Facility-Administered Medications  Medication Dose Route Frequency Provider Last Rate Last Dose  . 0.9 % NaCl with KCl 20 mEq/ L  infusion   Intravenous Continuous Marcellus Scott, MD 100 mL/hr at 08/14/11 2155    . acetaminophen (TYLENOL) tablet 650  mg  650 mg Oral Q6H PRN Marcellus Scott, MD       Or  . acetaminophen (TYLENOL) suppository 650 mg  650 mg Rectal Q6H PRN Marcellus Scott, MD      . albuterol (PROVENTIL) (5 MG/ML) 0.5% nebulizer solution 2.5 mg  2.5 mg Nebulization Q2H PRN Marcellus Scott, MD      . digoxin (LANOXIN) tablet 250 mcg  250 mcg Oral Daily Marcellus Scott, MD      . gi cocktail  30 mL Oral Once Nicholes Stairs, MD   30 mL at 08/14/11 0936  . heparin ADULT infusion 100 units/ml (25000 units/250 ml)  1,200 Units/hr Intravenous Continuous Marlin Canary, DO 12 mL/hr at 08/15/11 0312 1,200 Units/hr at 08/15/11 1610  . HYDROcodone-acetaminophen (NORCO) 5-325 MG per tablet 1-2 tablet  1-2 tablet Oral Q4H PRN Marcellus Scott, MD      . iohexol (OMNIPAQUE) 300 MG/ML solution 20 mL  20 mL Oral Q1 Hr x 2 Nicholes Stairs, MD   20 mL at 08/14/11 1215  . iohexol (OMNIPAQUE) 300 MG/ML solution 80 mL  80 mL Intravenous Once PRN Nicholes Stairs, MD   80 mL at 08/14/11 1341  . metoprolol  succinate (TOPROL-XL) 24 hr tablet 25 mg  25 mg Oral Daily Marcellus Scott, MD   25 mg at 08/14/11 2304  . morphine 2 MG/ML injection 1 mg  1 mg Intravenous Q3H PRN Marcellus Scott, MD   1 mg at 08/15/11 0126  . morphine 4 MG/ML injection 4 mg  4 mg Intravenous Once Nicholes Stairs, MD   4 mg at 08/14/11 0936  . morphine 4 MG/ML injection 4 mg  4 mg Intravenous Once Nicholes Stairs, MD   4 mg at 08/14/11 1435  . ondansetron (ZOFRAN) injection 4 mg  4 mg Intravenous Once Nicholes Stairs, MD   4 mg at 08/14/11 1610  . ondansetron (ZOFRAN) tablet 4 mg  4 mg Oral Q6H PRN Marcellus Scott, MD       Or  . ondansetron (ZOFRAN) injection 4 mg  4 mg Intravenous Q6H PRN Marcellus Scott, MD      . pantoprazole (PROTONIX) injection 40 mg  40 mg Intravenous Q1200 Marcellus Scott, MD   40 mg at 08/14/11 2304  . piperacillin-tazobactam (ZOSYN) IVPB 3.375 g  3.375 g Intravenous Once Nicholes Stairs, MD   3.375 g at 08/14/11 1638  .  piperacillin-tazobactam (ZOSYN) IVPB 3.375 g  3.375 g Intravenous Q8H Anh P Pham, PHARMD   3.375 g at 08/15/11 0121  . rosuvastatin (CRESTOR) tablet 20 mg  20 mg Oral q1800 Marcellus Scott, MD   20 mg at 08/14/11 2304  . sodium chloride 0.9 % injection 3 mL  3 mL Intravenous Q12H Marcellus Scott, MD      . DISCONTD: 0.9 %  sodium chloride infusion  1,000 mL Intravenous Continuous Nicholes Stairs, MD 125 mL/hr at 08/14/11 1115 1,000 mL at 08/14/11 1115  . DISCONTD: 0.9 %  sodium chloride infusion   Intravenous STAT Nicholes Stairs, MD 75 mL/hr at 08/14/11 1640      Assessment/Plan .Abdominal pain with new Common bile duct dilatation, and intrahepatic ductal dilatation. Inflammatory changes of the Gallbladder fossa. S/P Cholecystectomy 2009. Rising LFT's 2. New Splenomegaly  3.Hiatal Hernia  5.Diverticlosis, without diverticulitis.  6.  .  Atrial fibrillation Hx of DCCV  Chronic coumadin    .  Rheumatic heart disease/Mitral valve Replacement 1997 [facility] Mechanical Valve Dr. Donata Clay    .  HTN (hypertension)      unspecified   .  Pulmonary HTN      mild   .  Hypercholesterolemia    .  GERD (gastroesophageal reflux disease)    .  Goiter           Plan:  Currently GI to see and probable ERCP today.  Will see again as needed. Call if we can help.  LOS: 1 day    Chaddrick Brue 08/15/2011

## 2011-08-15 NOTE — Progress Notes (Signed)
Subjective: Just back from ERCP, sleepy, belly feeling better, no CP , no SOB  Objective: Vital signs in last 24 hours: Filed Vitals:   08/15/11 1430 08/15/11 1440 08/15/11 1450 08/15/11 1522  BP: 109/58 123/57 131/45 129/73  Pulse:    63  Temp:    98.2 F (36.8 C)  TempSrc:    Oral  Resp: 21 15 17 20   Height:      Weight:      SpO2: 93% 94% 92% 93%   Weight change:   Intake/Output Summary (Last 24 hours) at 08/15/11 1548 Last data filed at 08/15/11 1300  Gross per 24 hour  Intake 1132.33 ml  Output      1 ml  Net 1131.33 ml    Physical Exam: General: sleepy, No acute distress. HEENT: EOMI. Neck: Supple CV: S1 and S2, RRR, + click Lungs: Clear to ascultation bilaterally Abdomen: Soft, + tenderness, voluntary gaurding Ext: Good pulses. Trace edema.   Lab Results:  [facility] 08/15/11 0146 08/14/11 0954  NA 134* 141  K 3.9 3.8  CL 102 105  CO2 24 26  GLUCOSE 123* 154*  BUN 10 12  CREATININE 0.87 0.72  CALCIUM 8.5 8.9  MG -- --  PHOS -- --    Basename 08/15/11 0146 08/14/11 0954  AST 154* 114*  ALT 137* 59*  ALKPHOS 134* 113  BILITOT 3.3* 0.9  PROT 6.3 6.9  ALBUMIN 2.8* 3.1*    Basename 08/14/11 0954  LIPASE 37  AMYLASE --    Basename 08/15/11 0146 08/14/11 0954  WBC 11.6* 9.7  NEUTROABS -- 8.6*  HGB 10.7* 11.2*  HCT 32.8* 35.9*  MCV 86.3 88.2  PLT 126* 146*     Micro Results: No results found for this or any previous visit (from the past 240 hour(s)).  Studies/Results: Ct Abdomen Pelvis W Contrast  08/14/2011  *RADIOLOGY REPORT*  Clinical Data: Epigastric pain.  CT ABDOMEN AND PELVIS WITH CONTRAST  Technique:  Multidetector CT imaging of the abdomen and pelvis was performed following the standard protocol during bolus administration of intravenous contrast.  Contrast: 80mL OMNIPAQUE IOHEXOL 300 MG/ML IV SOLN  Comparison: CT of abdomen and pelvis 03/10/2008.  Findings:  Lung Bases: Dependent atelectasis in the lower lobe to the lungs  bilaterally.  Heart is moderately enlarged (with severe dilatation of the left atrium).  Status post mechanical mitral valve replacement. There is a small hiatal hernia.  Abdomen/Pelvis:  Compared to the prior examination there is extensive pneumobilia (although the gas extends into the periphery of the hepatic parenchyma, it is consistently noted adjacent to branches of the portal veins, and is not consistent with portal venous gas).  Mild - moderate intrahepatic biliary ductal dilatation is present, new compared to the prior examination.  As well, the common bile duct measures up to 17 mm in the porta hepatis.  The patient status post cholecystectomy.  Notably, in the gallbladder fossa and porta hepatis there is ill-defined fluid attenuation and stranding, suggesting inflammation, potentially from cholangitis.  The portal vein is widely patent as is the SMV. Pancreas is unremarkable in appearance.  Specifically, no inflammatory changes and no pancreatic ductal dilatation. Splenomegaly (16.8 cm AP) is new compared to the prior examination. The diffuse appearance of the adrenal glands and kidneys bilaterally is unremarkable.  No ascites or pneumoperitoneum and no pathologic distension of bowel.  No pathologic lymphadenopathy.  Numerous colonic diverticula are present, predominately in the region of the sigmoid colon, without surrounding inflammatory changes to suggest  acute diverticulitis at this time.  Normal appendix.  Uterus, the bilateral ovaries and urinary bladder are unremarkable in appearance.  Musculoskeletal: Irregularity of the anterior aspect of the right ilium is most likely to be post traumatic. There are no aggressive appearing lytic or blastic lesions noted in the visualized portions of the skeleton.  IMPRESSION: 1.  Interval development of common bile duct dilatation and moderate intrahepatic biliary ductal dilatation, with inflammatory changes in the gallbladder fossa (status post cholecystectomy)  and porta hepatis.  Findings are concerning for inflammatory process such as cholangitis. 2.  Marked interval increase in amount of pneumobilia compared to the prior examination (the patient has reportedly had prior common bile duct stent placements and has therefore had a sphincterotomy). 3.  Moderate cardiomegaly with severe left atrial dilatation.  The patient is status post mechanical mitral valve replacement. 4.  New splenomegaly. 5.  Small hiatal hernia. 6.  Colonic diverticulosis without findings to suggest acute diverticulitis.  These results were called by telephone on 08/14/2011  at  02:00 p.m. to  Dr. Weldon Inches, who verbally acknowledged these results.  Original Report Authenticated By: Florencia Reasons, M.D.   Dg Ercp  08/15/2011  *RADIOLOGY REPORT*  Clinical Data: Intra and extrahepatic biliary ductal dilation. Prior cholecystectomy.  ERCP 08/15/2011:  Comparison:  CT abdomen and pelvis yesterday.  Technique:  Multiple spot images were obtained with the fluoroscopic device and submitted for interpretation post- procedure.  ERCP was performed by Dr. Ewing Schlein.  Findings: Initial image shows mild intra and extrahepatic biliary ductal dilation, with a filling defect in the distal duct which could be a stone.  Dr. Ewing Schlein swept the duct with a balloon occlusion catheter.  Completion image shows antegrade flow into the duodenum, though there is narrowing of the distal duct.  IMPRESSION: Filling defect in the distal common bile duct consistent with a small stone.  Stricture versus spasm involving the distal common bile duct on the completion image.  These images were submitted for radiologic interpretation only. Please see the procedural report for the amount of contrast and the fluoroscopy time utilized.  Original Report Authenticated By: Arnell Sieving, M.D.   Dg Abd Acute W/chest  08/14/2011  *RADIOLOGY REPORT*  Clinical Data: Epigastric pain and nausea  ACUTE ABDOMEN SERIES (ABDOMEN 2 VIEW & CHEST 1  VIEW)  Comparison: August 21, 2007 and April 26, 2010  Findings: Mild cardiomegaly is unchanged.  There is evidence of prior valve replacement surgery, stable in appearance.  The mediastinum and pulmonary vasculature are within normal limits. Both lungs are clear.  The stool and bowel gas pattern is within normal limits with no evidence of obstruction.  No pneumoperitoneum.  There is no gross organomegaly.  No abnormal calcifications are present over the abdomen or pelvis.  No osseous lesions.  IMPRESSION: Stable, mild cardiomegaly.  Normal stool and bowel gas pattern.  Original Report Authenticated By: Brandon Melnick, M.D.    Medications: I have reviewed the patient's current medications. Scheduled Meds:   . digoxin  250 mcg Oral Daily  . metoprolol succinate  25 mg Oral Daily  . pantoprazole (PROTONIX) IV  40 mg Intravenous Q1200  . piperacillin-tazobactam (ZOSYN)  IV  3.375 g Intravenous Once  . piperacillin-tazobactam (ZOSYN)  IV  3.375 g Intravenous Q8H  . rosuvastatin  20 mg Oral q1800  . sodium chloride  3 mL Intravenous Q12H  . DISCONTD: sodium chloride   Intravenous STAT  . DISCONTD: sodium chloride   Intravenous Once  Continuous Infusions:   . 0.9 % NaCl with KCl 20 mEq / L 100 mL/hr at 08/14/11 2155  . DISCONTD: sodium chloride 1,000 mL (08/14/11 1115)  . DISCONTD: heparin 1,200 Units/hr (08/15/11 0312)   PRN Meds:.acetaminophen, acetaminophen, albuterol, HYDROcodone-acetaminophen, morphine, ondansetron (ZOFRAN) IV, ondansetron, DISCONTD: butamben-tetracaine-benzocaine, DISCONTD: fentaNYL, DISCONTD: midazolam, DISCONTD: Omnipaque 350 mg/mL (50 mL) in 0.9% normal saline (50 mL)  Assessment/Plan:  Atrial fib with pauses- ok for pauses < 2.5 sec and if patient is asymptomatic, rate controlled continue medications (dig/metoprolol)   HYPERTENSION, UNSPECIFIED- stable   GERD- IV protonix   MITRAL VALVE REPLACEMENT, HX OF- restart coumadin when ok with GI, heparin gtt for  now   Abdominal pain- s/p ERCP with removal of stones- recs per GI are to continue antibiotics and if doing well tomm advance diet   Hyponatremia- watch    LOS: 1 day  Endre Coutts, DO 08/15/2011, 3:48 PM

## 2011-08-15 NOTE — Progress Notes (Signed)
  ANTICOAGULATION CONSULT NOTE - Follow Up Consult Pharmacy Consult for Heparin  Indication: [employer] mitral valve  No Known Allergies  Patient Measurements: Height: 5\' 4"  (162.6 cm) Weight: 175 lb 14.8 oz (79.8 kg) IBW/kg (Calculated) : 54.7  Heparin Dosing Weight: 72kg  Vital Signs: Temp: 98.5 F (36.9 C) (02/22 2054) Temp src: Oral (02/22 1522) BP: 120/65 mmHg (02/22 2054) Pulse Rate: 61  (02/22 2054)  Labs:  Basename 08/15/11 2129 08/15/11 1041 08/15/11 0146 08/14/11 0954  HGB -- -- 10.7* 11.2*  HCT -- -- 32.8* 35.9*  PLT -- -- 126* 146*  APTT -- -- -- --  LABPROT -- -- 30.0* 30.5*  INR -- -- 2.81* 2.87*  HEPARINUNFRC 0.10* <0.10* <0.10* --  CREATININE -- -- 0.87 0.72  CKTOTAL -- -- -- --  CKMB -- -- -- --  TROPONINI -- -- -- --   Estimated Creatinine Clearance: 72 ml/min (by C-G formula based on Cr of 0.87).  Assessment: Anticoagulation: [employer] mitral valve/afib, coumadin PTA, goal INR 3-3.5, INR subtherapeutic since admission (2.8). Heparin level (0.1) is below-goal on 1200 units/hr. Per RN, likely that heparin was not infusing correctly and therefore unable to evaluate heparin level.   Goal of Therapy:  Heparin level 0.3-0.7 units/ml   Plan:  1. Continue IV heparin at 1200 units/hr 2. Follow-up heparin level, CBC in AM.  Thad Ranger Mellody Drown 08/15/2011,10:35 PM

## 2011-08-15 NOTE — Consult Note (Signed)
Reason for Consult: Abnormal liver tests abnormal CAT scan Referring Physician: Hospital team [name] is an 55 y.o. female.  HPI: Patient familiar to my partners with 2 previous ERCP and history of cholangitis and CBD stone who had a third attack she says treated medically and was in her normal state of health until recently when she had increased right upper quadrant pain and chills and sweats and presented to the emergency room with increased liver tests and although she has air in the bile duct they are more dilated than before as well as a questionable cholangitis on CT. She's actually better today without pain and without chills or sweats however her liver tests are increased. She's been asymptomatic from a GI standpoint other than the above and has no other complaints  Past Medical History  Diagnosis Date  . Atrial fibrillation   . Rheumatic heart disease   . HTN (hypertension)     unspecified  . Pulmonary HTN     mild  . Hypercholesterolemia   . GERD (gastroesophageal reflux disease)   . Goiter     multinodular    Past Surgical History  Procedure Date  . Wrist arthroscopy   . Endoscopic retrograde cholangiopancreatography with stent placement and stone removal   . Carpal tunnel release   . Cardiac valve replacement   . Cardiac catheterization   . Neck surgery     Family History  Problem Relation Age of Onset  . Hypothyroidism Sister   . Gallbladder disease Sister   . Coronary artery disease    . Diabetes Mother   . Hypertension Mother   . Heart disease Father   . Stroke Father     Social History:  reports that she has never smoked. She does not have any smokeless tobacco history on file. She reports that she does not drink alcohol or use illicit drugs.  Allergies: No Known Allergies  Medications: I have reviewed the patient's current medications.  Results for orders placed during the hospital encounter of 08/14/11 (from the past 48 hour(s))  CBC      Status: Abnormal   Collection Time   08/14/11  9:54 AM      Component Value Range Comment   WBC 9.7  4.0 - 10.5 (K/uL)    RBC 4.07  3.87 - 5.11 (MIL/uL)    Hemoglobin 11.2 (*) 12.0 - 15.0 (g/dL)    HCT 16.1 (*) 09.6 - 46.0 (%)    MCV 88.2  78.0 - 100.0 (fL)    MCH 27.5  26.0 - 34.0 (pg)    MCHC 31.2  30.0 - 36.0 (g/dL)    RDW 04.5  40.9 - 81.1 (%)    Platelets 146 (*) 150 - 400 (K/uL)   DIFFERENTIAL     Status: Abnormal   Collection Time   08/14/11  9:54 AM      Component Value Range Comment   Neutrophils Relative 89 (*) 43 - 77 (%)    Neutro Abs 8.6 (*) 1.7 - 7.7 (K/uL)    Lymphocytes Relative 7 (*) 12 - 46 (%)    Lymphs Abs 0.7  0.7 - 4.0 (K/uL)    Monocytes Relative 4  3 - 12 (%)    Monocytes Absolute 0.4  0.1 - 1.0 (K/uL)    Eosinophils Relative 0  0 - 5 (%)    Eosinophils Absolute 0.0  0.0 - 0.7 (K/uL)    Basophils Relative 0  0 - 1 (%)    Basophils  Absolute 0.0  0.0 - 0.1 (K/uL)   COMPREHENSIVE METABOLIC PANEL     Status: Abnormal   Collection Time   08/14/11  9:54 AM      Component Value Range Comment   Sodium 141  135 - 145 (mEq/L)    Potassium 3.8  3.5 - 5.1 (mEq/L)    Chloride 105  96 - 112 (mEq/L)    CO2 26  19 - 32 (mEq/L)    Glucose, Bld 154 (*) 70 - 99 (mg/dL)    BUN 12  6 - 23 (mg/dL)    Creatinine, Ser 1.19  0.50 - 1.10 (mg/dL)    Calcium 8.9  8.4 - 10.5 (mg/dL)    Total Protein 6.9  6.0 - 8.3 (g/dL)    Albumin 3.1 (*) 3.5 - 5.2 (g/dL)    AST 147 (*) 0 - 37 (U/L)    ALT 59 (*) 0 - 35 (U/L)    Alkaline Phosphatase 113  39 - 117 (U/L)    Total Bilirubin 0.9  0.3 - 1.2 (mg/dL)    GFR calc non Af Amer >90  >90 (mL/min)    GFR calc Af Amer >90  >90 (mL/min)   LIPASE, BLOOD     Status: Normal   Collection Time   08/14/11  9:54 AM      Component Value Range Comment   Lipase 37  11 - 59 (U/L)   DIGOXIN LEVEL     Status: Normal   Collection Time   08/14/11  9:54 AM      Component Value Range Comment   Digoxin Level 1.3  0.8 - 2.0 (ng/mL)   PROTIME-INR      Status: Abnormal   Collection Time   08/14/11  9:54 AM      Component Value Range Comment   Prothrombin Time 30.5 (*) 11.6 - 15.2 (seconds)    INR 2.87 (*) 0.00 - 1.49    HEPARIN LEVEL (UNFRACTIONATED)     Status: Abnormal   Collection Time   08/15/11  1:46 AM      Component Value Range Comment   Heparin Unfractionated <0.10 (*) 0.30 - 0.70 (IU/mL)   CBC     Status: Abnormal   Collection Time   08/15/11  1:46 AM      Component Value Range Comment   WBC 11.6 (*) 4.0 - 10.5 (K/uL)    RBC 3.80 (*) 3.87 - 5.11 (MIL/uL)    Hemoglobin 10.7 (*) 12.0 - 15.0 (g/dL)    HCT 82.9 (*) 56.2 - 46.0 (%)    MCV 86.3  78.0 - 100.0 (fL)    MCH 28.2  26.0 - 34.0 (pg)    MCHC 32.6  30.0 - 36.0 (g/dL)    RDW 13.0  86.5 - 78.4 (%)    Platelets 126 (*) 150 - 400 (K/uL)   COMPREHENSIVE METABOLIC PANEL     Status: Abnormal   Collection Time   08/15/11  1:46 AM      Component Value Range Comment   Sodium 134 (*) 135 - 145 (mEq/L) DELTA CHECK NOTED   Potassium 3.9  3.5 - 5.1 (mEq/L)    Chloride 102  96 - 112 (mEq/L)    CO2 24  19 - 32 (mEq/L)    Glucose, Bld 123 (*) 70 - 99 (mg/dL)    BUN 10  6 - 23 (mg/dL)    Creatinine, Ser 6.96  0.50 - 1.10 (mg/dL)    Calcium 8.5  8.4 - 10.5 (mg/dL)  Total Protein 6.3  6.0 - 8.3 (g/dL)    Albumin 2.8 (*) 3.5 - 5.2 (g/dL)    AST 161 (*) 0 - 37 (U/L)    ALT 137 (*) 0 - 35 (U/L)    Alkaline Phosphatase 134 (*) 39 - 117 (U/L)    Total Bilirubin 3.3 (*) 0.3 - 1.2 (mg/dL)    GFR calc non Af Amer 72 (*) >90 (mL/min)    GFR calc Af Amer 83 (*) >90 (mL/min)   PROTIME-INR     Status: Abnormal   Collection Time   08/15/11  1:46 AM      Component Value Range Comment   Prothrombin Time 30.0 (*) 11.6 - 15.2 (seconds)    INR 2.81 (*) 0.00 - 1.49      Ct Abdomen Pelvis W Contrast  08/14/2011  *RADIOLOGY REPORT*  Clinical Data: Epigastric pain.  CT ABDOMEN AND PELVIS WITH CONTRAST  Technique:  Multidetector CT imaging of the abdomen and pelvis was performed following the  standard protocol during bolus administration of intravenous contrast.  Contrast: 80mL OMNIPAQUE IOHEXOL 300 MG/ML IV SOLN  Comparison: CT of abdomen and pelvis 03/10/2008.  Findings:  Lung Bases: Dependent atelectasis in the lower lobe to the lungs bilaterally.  Heart is moderately enlarged (with severe dilatation of the left atrium).  Status post mechanical mitral valve replacement. There is a small hiatal hernia.  Abdomen/Pelvis:  Compared to the prior examination there is extensive pneumobilia (although the gas extends into the periphery of the hepatic parenchyma, it is consistently noted adjacent to branches of the portal veins, and is not consistent with portal venous gas).  Mild - moderate intrahepatic biliary ductal dilatation is present, new compared to the prior examination.  As well, the common bile duct measures up to 17 mm in the porta hepatis.  The patient status post cholecystectomy.  Notably, in the gallbladder fossa and porta hepatis there is ill-defined fluid attenuation and stranding, suggesting inflammation, potentially from cholangitis.  The portal vein is widely patent as is the SMV. Pancreas is unremarkable in appearance.  Specifically, no inflammatory changes and no pancreatic ductal dilatation. Splenomegaly (16.8 cm AP) is new compared to the prior examination. The diffuse appearance of the adrenal glands and kidneys bilaterally is unremarkable.  No ascites or pneumoperitoneum and no pathologic distension of bowel.  No pathologic lymphadenopathy.  Numerous colonic diverticula are present, predominately in the region of the sigmoid colon, without surrounding inflammatory changes to suggest acute diverticulitis at this time.  Normal appendix.  Uterus, the bilateral ovaries and urinary bladder are unremarkable in appearance.  Musculoskeletal: Irregularity of the anterior aspect of the right ilium is most likely to be post traumatic. There are no aggressive appearing lytic or blastic lesions  noted in the visualized portions of the skeleton.  IMPRESSION: 1.  Interval development of common bile duct dilatation and moderate intrahepatic biliary ductal dilatation, with inflammatory changes in the gallbladder fossa (status post cholecystectomy) and porta hepatis.  Findings are concerning for inflammatory process such as cholangitis. 2.  Marked interval increase in amount of pneumobilia compared to the prior examination (the patient has reportedly had prior common bile duct stent placements and has therefore had a sphincterotomy). 3.  Moderate cardiomegaly with severe left atrial dilatation.  The patient is status post mechanical mitral valve replacement. 4.  New splenomegaly. 5.  Small hiatal hernia. 6.  Colonic diverticulosis without findings to suggest acute diverticulitis.  These results were called by telephone on 08/14/2011  at  02:00 p.m. to  Dr. Weldon Inches, who verbally acknowledged these results.  Original Report Authenticated By: Florencia Reasons, M.D.   Dg Abd Acute W/chest  08/14/2011  *RADIOLOGY REPORT*  Clinical Data: Epigastric pain and nausea  ACUTE ABDOMEN SERIES (ABDOMEN 2 VIEW & CHEST 1 VIEW)  Comparison: August 21, 2007 and April 26, 2010  Findings: Mild cardiomegaly is unchanged.  There is evidence of prior valve replacement surgery, stable in appearance.  The mediastinum and pulmonary vasculature are within normal limits. Both lungs are clear.  The stool and bowel gas pattern is within normal limits with no evidence of obstruction.  No pneumoperitoneum.  There is no gross organomegaly.  No abnormal calcifications are present over the abdomen or pelvis.  No osseous lesions.  IMPRESSION: Stable, mild cardiomegaly.  Normal stool and bowel gas pattern.  Original Report Authenticated By: Brandon Melnick, M.D.    ROS negative except above Blood pressure 125/66, pulse 57, temperature 98.1 F (36.7 C), temperature source Oral, resp. rate 18, height 5\' 4"  (1.626 m), weight 79.8 kg  (175 lb 14.8 oz), SpO2 96.00%. Physical Exam vital signs stable afebrile no acute distress sclera nonicteric lungs are clear heart regular rate and rhythm abdomen is soft nontender labs CT and office chart reviewed  Assessment/Plan: Patient with probable cholangitis and multiple medical problems with previous CBD stones and infection Plan: The risks benefits and methods of repeat ERCP was rediscussed with the patient and I gave her the option of seeing how she does and continuing antibiotics but because of her significant changes on CT and liver tests we have elected to proceed today with further workup and plans pending those findings  Mariana Wiederholt E 08/15/2011, 9:41 AM

## 2011-08-16 LAB — PREPARE FRESH FROZEN PLASMA: Unit division: 0

## 2011-08-16 LAB — COMPREHENSIVE METABOLIC PANEL
Alkaline Phosphatase: 124 U/L — ABNORMAL HIGH (ref 39–117)
BUN: 9 mg/dL (ref 6–23)
Chloride: 106 mEq/L (ref 96–112)
GFR calc Af Amer: 88 mL/min — ABNORMAL LOW (ref >90)
GFR calc non Af Amer: 76 mL/min — ABNORMAL LOW (ref >90)
Glucose, Bld: 99 mg/dL (ref 70–99)
Potassium: 4.3 mEq/L (ref 3.5–5.1)
Total Bilirubin: 2.6 mg/dL — ABNORMAL HIGH (ref 0.3–1.2)

## 2011-08-16 LAB — CBC
HCT: 32 % — ABNORMAL LOW (ref 36.0–46.0)
MCV: 86.3 fL (ref 78.0–100.0)
RDW: 14.5 % (ref 11.5–15.5)
WBC: 5.6 10*3/uL (ref 4.0–10.5)

## 2011-08-16 LAB — PROTIME-INR: Prothrombin Time: 18 seconds — ABNORMAL HIGH (ref 11.6–15.2)

## 2011-08-16 MED ORDER — WARFARIN SODIUM 5 MG PO TABS
5.0000 mg | ORAL_TABLET | Freq: Once | ORAL | Status: AC
  Administered 2011-08-16: 5 mg via ORAL
  Filled 2011-08-16: qty 1

## 2011-08-16 MED ORDER — HEPARIN SOD (PORCINE) IN D5W 100 UNIT/ML IV SOLN
2150.0000 [IU]/h | INTRAVENOUS | Status: DC
  Administered 2011-08-16 (×2): 1900 [IU]/h via INTRAVENOUS
  Administered 2011-08-17 – 2011-08-19 (×5): 2100 [IU]/h via INTRAVENOUS
  Administered 2011-08-20: 2150 [IU]/h via INTRAVENOUS
  Administered 2011-08-20: 2100 [IU]/h via INTRAVENOUS
  Administered 2011-08-21 – 2011-08-22 (×3): 2150 [IU]/h via INTRAVENOUS
  Filled 2011-08-16 (×13): qty 250

## 2011-08-16 MED ORDER — PANTOPRAZOLE SODIUM 40 MG PO TBEC
40.0000 mg | DELAYED_RELEASE_TABLET | Freq: Every day | ORAL | Status: DC
  Administered 2011-08-16 – 2011-08-22 (×6): 40 mg via ORAL
  Filled 2011-08-16 (×3): qty 1
  Filled 2011-08-16: qty 2
  Filled 2011-08-16 (×2): qty 1

## 2011-08-16 NOTE — Progress Notes (Signed)
Subjective: Awake, no new C/o, feeling and eating well   Objective: Vital signs in last 24 hours: Filed Vitals:   08/15/11 1522 08/15/11 2054 08/16/11 0532 08/16/11 1052  BP: 129/73 120/65 139/78 145/75  Pulse: 63 61 62 67  Temp: 98.2 F (36.8 C) 98.5 F (36.9 C) 98 F (36.7 C)   TempSrc: Oral     Resp: 20 20 20    Height:      Weight:   81.1 kg (178 lb 12.7 oz)   SpO2: 93% 94% 94%    Weight change: 1.3 kg (2 lb 13.9 oz)  Intake/Output Summary (Last 24 hours) at 08/16/11 1143 Last data filed at 08/16/11 0600  Gross per 24 hour  Intake 3225.43 ml  Output      0 ml  Net 3225.43 ml    Physical Exam: General: sleepy, No acute distress. HEENT: EOMI. Neck: Supple CV: S1 and S2, irr, + click Lungs: Clear to ascultation bilaterally Abdomen: Soft, min tenderness, +BS Ext: Good pulses. Trace edema.   Lab Results:  [facility] 08/16/11 0545 08/15/11 0146  NA 136 134*  K 4.3 3.9  CL 106 102  CO2 23 24  GLUCOSE 99 123*  BUN 9 10  CREATININE 0.83 0.87  CALCIUM 8.4 8.5  MG -- --  PHOS -- --    Basename 08/16/11 0545 08/15/11 0146  AST 87* 154*  ALT 91* 137*  ALKPHOS 124* 134*  BILITOT 2.6* 3.3*  PROT 6.4 6.3  ALBUMIN 2.7* 2.8*    Basename 08/14/11 0954  LIPASE 37  AMYLASE --    Basename 08/16/11 0545 08/15/11 0146 08/14/11 0954  WBC 5.6 11.6* --  NEUTROABS -- -- 8.6*  HGB 10.3* 10.7* --  HCT 32.0* 32.8* --  MCV 86.3 86.3 --  PLT 113* 126* --     Micro Results: No results found for this or any previous visit (from the past 240 hour(s)).  Studies/Results: Ct Abdomen Pelvis W Contrast  08/14/2011  *RADIOLOGY REPORT*  Clinical Data: Epigastric pain.  CT ABDOMEN AND PELVIS WITH CONTRAST  Technique:  Multidetector CT imaging of the abdomen and pelvis was performed following the standard protocol during bolus administration of intravenous contrast.  Contrast: 80mL OMNIPAQUE IOHEXOL 300 MG/ML IV SOLN  Comparison: CT of abdomen and pelvis 03/10/2008.  Findings:   Lung Bases: Dependent atelectasis in the lower lobe to the lungs bilaterally.  Heart is moderately enlarged (with severe dilatation of the left atrium).  Status post mechanical mitral valve replacement. There is a small hiatal hernia.  Abdomen/Pelvis:  Compared to the prior examination there is extensive pneumobilia (although the gas extends into the periphery of the hepatic parenchyma, it is consistently noted adjacent to branches of the portal veins, and is not consistent with portal venous gas).  Mild - moderate intrahepatic biliary ductal dilatation is present, new compared to the prior examination.  As well, the common bile duct measures up to 17 mm in the porta hepatis.  The patient status post cholecystectomy.  Notably, in the gallbladder fossa and porta hepatis there is ill-defined fluid attenuation and stranding, suggesting inflammation, potentially from cholangitis.  The portal vein is widely patent as is the SMV. Pancreas is unremarkable in appearance.  Specifically, no inflammatory changes and no pancreatic ductal dilatation. Splenomegaly (16.8 cm AP) is new compared to the prior examination. The diffuse appearance of the adrenal glands and kidneys bilaterally is unremarkable.  No ascites or pneumoperitoneum and no pathologic distension of bowel.  No pathologic lymphadenopathy.  Numerous colonic diverticula are present, predominately in the region of the sigmoid colon, without surrounding inflammatory changes to suggest acute diverticulitis at this time.  Normal appendix.  Uterus, the bilateral ovaries and urinary bladder are unremarkable in appearance.  Musculoskeletal: Irregularity of the anterior aspect of the right ilium is most likely to be post traumatic. There are no aggressive appearing lytic or blastic lesions noted in the visualized portions of the skeleton.  IMPRESSION: 1.  Interval development of common bile duct dilatation and moderate intrahepatic biliary ductal dilatation, with  inflammatory changes in the gallbladder fossa (status post cholecystectomy) and porta hepatis.  Findings are concerning for inflammatory process such as cholangitis. 2.  Marked interval increase in amount of pneumobilia compared to the prior examination (the patient has reportedly had prior common bile duct stent placements and has therefore had a sphincterotomy). 3.  Moderate cardiomegaly with severe left atrial dilatation.  The patient is status post mechanical mitral valve replacement. 4.  New splenomegaly. 5.  Small hiatal hernia. 6.  Colonic diverticulosis without findings to suggest acute diverticulitis.  These results were called by telephone on 08/14/2011  at  02:00 p.m. to  Dr. Weldon Inches, who verbally acknowledged these results.  Original Report Authenticated By: Florencia Reasons, M.D.   Dg Ercp  08/15/2011  *RADIOLOGY REPORT*  Clinical Data: Intra and extrahepatic biliary ductal dilation. Prior cholecystectomy.  ERCP 08/15/2011:  Comparison:  CT abdomen and pelvis yesterday.  Technique:  Multiple spot images were obtained with the fluoroscopic device and submitted for interpretation post- procedure.  ERCP was performed by Dr. Ewing Schlein.  Findings: Initial image shows mild intra and extrahepatic biliary ductal dilation, with a filling defect in the distal duct which could be a stone.  Dr. Ewing Schlein swept the duct with a balloon occlusion catheter.  Completion image shows antegrade flow into the duodenum, though there is narrowing of the distal duct.  IMPRESSION: Filling defect in the distal common bile duct consistent with a small stone.  Stricture versus spasm involving the distal common bile duct on the completion image.  These images were submitted for radiologic interpretation only. Please see the procedural report for the amount of contrast and the fluoroscopy time utilized.  Original Report Authenticated By: Arnell Sieving, M.D.    Medications: I have reviewed the patient's current  medications. Scheduled Meds:    . digoxin  250 mcg Oral Daily  . metoprolol succinate  25 mg Oral Daily  . pantoprazole (PROTONIX) IV  40 mg Intravenous Q1200  . rosuvastatin  20 mg Oral q1800  . sodium chloride  3 mL Intravenous Q12H  . warfarin  5 mg Oral ONCE-1800  . DISCONTD: sodium chloride   Intravenous Once  . DISCONTD: piperacillin-tazobactam (ZOSYN)  IV  3.375 g Intravenous Q8H   Continuous Infusions:    . 0.9 % NaCl with KCl 20 mEq / L 100 mL/hr at 08/16/11 0548  . heparin 1,200 Units/hr (08/16/11 0548)   PRN Meds:.acetaminophen, acetaminophen, albuterol, HYDROcodone-acetaminophen, morphine, ondansetron (ZOFRAN) IV, ondansetron, DISCONTD: butamben-tetracaine-benzocaine, DISCONTD: fentaNYL, DISCONTD: midazolam, DISCONTD: Omnipaque 350 mg/mL (50 mL) in 0.9% normal saline (50 mL)  Assessment/Plan:  Atrial fib with pauses- ok for pauses < 2.5 sec and if patient is asymptomatic, rate controlled continue medications (dig/metoprolol)   HYPERTENSION, UNSPECIFIED- stable   GERD-PO protonix   MITRAL VALVE REPLACEMENT, HX OF- restart coumadin today continue  heparin gtt til INR > 3   Abdominal pain- s/p ERCP with removal of stones-  Stopped abx and advancing  diet, encouraged ambulation  Hyponatremia- watch  D/C when INR > 2.5    LOS: 2 days  Joanna Dwyer, DO 08/16/2011, 11:43 AM

## 2011-08-16 NOTE — Progress Notes (Signed)
ANTICOAGULATION CONSULT NOTE - Follow Up  Pharmacy Consult for Heparin/Coumadin Indication: St. Jude mitral valve  No Known Allergies  Vital Signs: Temp: 98.3 F (36.8 C) (02/23 2105) Temp src: Oral (02/23 2105) BP: 135/80 mmHg (02/23 2105) Pulse Rate: 61  (02/23 2105)  Heparin dosing weight: 72 kg.   Labs:  [facility] 08/16/11 2216 08/16/11 1400 08/16/11 0545 08/15/11 0146 08/14/11 0954  HGB -- -- 10.3* 10.7* --  HCT -- -- 32.0* 32.8* 35.9*  PLT -- -- 113* 126* 146*  APTT -- -- -- -- --  LABPROT -- -- 18.0* 30.0* 30.5*  INR -- -- 1.46 2.81* 2.87*  HEPARINUNFRC 0.25* 0.12* <0.10* -- --  CREATININE -- -- 0.83 0.87 0.72  CKTOTAL -- -- -- -- --  CKMB -- -- -- -- --  TROPONINI -- -- -- -- --   Estimated Creatinine Clearance: 76.2 ml/min (by C-G formula based on Cr of 0.83).  Medical History: Past Medical History  Diagnosis Date  . Rheumatic heart disease   . HTN (hypertension)     unspecified  . Pulmonary HTN     mild  . Hypercholesterolemia   . GERD (gastroesophageal reflux disease)   . Goiter     multinodular  . Atrial fibrillation     Assessment: 56 yof on heparin and Coumadin for St. Jude mitral valve. Heparin level (0.25) is below-goal on 1900 units/hr. No problem with line per RN.   Goal of Therapy:  Heparin level 0.3-0.7   Plan:  1. Increase IV heparin to 2100 units/hr.  2. Heparin level with AM labs.   Emeline Gins 08/16/2011,11:19 PM

## 2011-08-16 NOTE — Progress Notes (Addendum)
ANTICOAGULATION CONSULT NOTE - Initial Consult  Pharmacy Consult for Heparin/Coumadin Indication: St. Jude mitral valve  No Known Allergies  Vital Signs: Temp: 97.9 F (36.6 C) (02/23 1405) Temp src: Oral (02/23 1405) BP: 142/88 mmHg (02/23 1405) Pulse Rate: 68  (02/23 1405)  Labs:  Basename 08/16/11 1400 08/16/11 0545 08/15/11 2129 08/15/11 0146 08/14/11 0954  HGB -- 10.3* -- 10.7* --  HCT -- 32.0* -- 32.8* 35.9*  PLT -- 113* -- 126* 146*  APTT -- -- -- -- --  LABPROT -- 18.0* -- 30.0* 30.5*  INR -- 1.46 -- 2.81* 2.87*  HEPARINUNFRC 0.12* <0.10* 0.10* -- --  CREATININE -- 0.83 -- 0.87 0.72  CKTOTAL -- -- -- -- --  CKMB -- -- -- -- --  TROPONINI -- -- -- -- --   Estimated Creatinine Clearance: 76.2 ml/min (by C-G formula based on Cr of 0.83).  Medical History: Past Medical History  Diagnosis Date  . Rheumatic heart disease   . HTN (hypertension)     unspecified  . Pulmonary HTN     mild  . Hypercholesterolemia   . GERD (gastroesophageal reflux disease)   . Goiter     multinodular  . Atrial fibrillation     Medications:  Heparin @ 1550 units/hr  Assessment: 56yof continues on heparin bridge for St. Jude mitral valve while INR subtherapeutic. Heparin level still below goal despite rate increase. Heparin infusing ok. Also to resume coumadin today (was on hold for ERCP). Home dose is 5mg  daily except 2.5mg  on Mon/Fri. No bleeding noted.  Goal of Therapy:  INR 3-3.5 Heparin level 0.3-0.7   Plan:  1) Increase heparin to 1900 units/hr 2) 6h heparin level 3) Coumadin 5mg  x 1 4) Daily INR  Fredrik Rigger 08/16/2011,3:25 PM

## 2011-08-16 NOTE — Progress Notes (Signed)
Pt had 2 pauses one 2.7 at 1732 and one 2.63 at 1717 while on tele. Notified MD, no orders given. Will continue to monitor.  Peter Congo RN

## 2011-08-16 NOTE — Progress Notes (Signed)
  ANTICOAGULATION CONSULT NOTE - Follow Up Consult Pharmacy Consult for Heparin  Indication: St. Jude mitral valve  No Known Allergies  Patient Measurements: Height: 5\' 4"  (162.6 cm) Weight: 178 lb 12.7 oz (81.1 kg) IBW/kg (Calculated) : 54.7  Heparin Dosing Weight: 72kg  Vital Signs: Temp: 98 F (36.7 C) (02/23 0532) BP: 139/78 mmHg (02/23 0532) Pulse Rate: 62  (02/23 0532)  Labs:  [name] 08/16/11 0545 08/15/11 2129 08/15/11 1041 08/15/11 0146 08/14/11 0954  HGB 10.3* -- -- 10.7* --  HCT 32.0* -- -- 32.8* 35.9*  PLT 113* -- -- 126* 146*  APTT -- -- -- -- --  LABPROT 18.0* -- -- 30.0* 30.5*  INR 1.46 -- -- 2.81* 2.87*  HEPARINUNFRC <0.10* 0.10* <0.10* -- --  CREATININE -- -- -- 0.87 0.72  CKTOTAL -- -- -- -- --  CKMB -- -- -- -- --  TROPONINI -- -- -- -- --   Estimated Creatinine Clearance: 72.7 ml/min (by C-G formula based on Cr of 0.87).  Assessment: 56 yo female with h/o  St. Jude mitral valve/afib, Coumadin on hold for ERCP 2/22, for Heparin  Goal of Therapy:  Heparin level 0.3-0.7 units/ml   Plan:  Increase Heparin  1550 units/hr Check heparin level in 6 hours.  Sanjuana Mruk, Gary Fleet 08/16/2011,6:51 AM

## 2011-08-16 NOTE — Progress Notes (Signed)
Patient ID: [name], female   DOB: Apr 16, 1956, 57 y.o.   MRN: [id number] [facility] Progress Note  [name] 56 y.o. Apr 10, 1956   Subjective: Doing ok. Denies abdominal pain. Complains of sore throat. S/P ERCP and 2 CBD stones removed.  Objective: Vital signs: Filed Vitals:   08/16/11 0532  BP: 139/78  Pulse: 62  Temp: 98 F (36.7 C)  Resp: 20    Intake/Output last 24 hrs:  Intake/Output Summary (Last 24 hours) at 08/16/11 0959 Last data filed at 08/16/11 0600  Gross per 24 hour  Intake 3225.43 ml  Output      0 ml  Net 3225.43 ml    Physical Exam: Gen: alert, no acute distress  Abd: epigastric tenderness with guarding, otherwise minimal tenderness, soft, nondistended, positive bowel sounds  Lab Results:  [facility] 08/16/11 0545 08/15/11 0146  NA 136 134*  K 4.3 3.9  CL 106 102  CO2 23 24  GLUCOSE 99 123*  BUN 9 10  CREATININE 0.83 0.87  CALCIUM 8.4 8.5  MG -- --  PHOS -- --    Basename 08/16/11 0545 08/15/11 0146  AST 87* 154*  ALT 91* 137*  ALKPHOS 124* 134*  BILITOT 2.6* 3.3*  PROT 6.4 6.3  ALBUMIN 2.7* 2.8*    Basename 08/16/11 0545 08/15/11 0146 08/14/11 0954  WBC 5.6 11.6* --  NEUTROABS -- -- 8.6*  HGB 10.3* 10.7* --  HCT 32.0* 32.8* --  MCV 86.3 86.3 --  PLT 113* 126* --     Medications: I have reviewed the patient's current medications.  Assessment/Plan: 56yo S/P ERCP (no sphincterotomy since done previously) and removal of 2 CBD stones yesterday with improving LFTs today. No sign of cholangitis on ERCP. Will advance diet. Encourage ambulation. D/C Zosyn. F/U with Dr. Ewing Schlein in 2 weeks.   [name] C. 08/16/2011, 9:59 AM

## 2011-08-17 LAB — COMPREHENSIVE METABOLIC PANEL
Alkaline Phosphatase: 130 U/L — ABNORMAL HIGH (ref 39–117)
BUN: 6 mg/dL (ref 6–23)
Calcium: 8.7 mg/dL (ref 8.4–10.5)
GFR calc Af Amer: >90 mL/min (ref >90)
GFR calc non Af Amer: >90 mL/min (ref >90)
Glucose, Bld: 97 mg/dL (ref 70–99)
Total Protein: 6.3 g/dL (ref 6.0–8.3)

## 2011-08-17 LAB — PROTIME-INR: Prothrombin Time: 18.6 seconds — ABNORMAL HIGH (ref 11.6–15.2)

## 2011-08-17 LAB — CBC
HCT: 29.8 % — ABNORMAL LOW (ref 36.0–46.0)
MCHC: 32.2 g/dL (ref 30.0–36.0)
MCV: 87.4 fL (ref 78.0–100.0)
RDW: 14.4 % (ref 11.5–15.5)

## 2011-08-17 MED ORDER — WARFARIN SODIUM 5 MG PO TABS
5.0000 mg | ORAL_TABLET | Freq: Once | ORAL | Status: AC
  Administered 2011-08-17: 5 mg via ORAL
  Filled 2011-08-17: qty 1

## 2011-08-17 NOTE — Progress Notes (Signed)
Subjective: Awake, no new C/O, tolerating food   Objective: Vital signs in last 24 hours: Filed Vitals:   08/16/11 1405 08/16/11 2105 08/17/11 0550 08/17/11 0939  BP: 142/88 135/80 125/71 138/69  Pulse: 68 61 69 72  Temp: 97.9 F (36.6 C) 98.3 F (36.8 C) 98.3 F (36.8 C)   TempSrc: Oral Oral Oral   Resp: 18 19 17    Height:      Weight:   81 kg (178 lb 9.2 oz)   SpO2: 94% 96% 98%    Weight change: -0.1 kg (-3.5 oz)  Intake/Output Summary (Last 24 hours) at 08/17/11 1142 Last data filed at 08/17/11 0900  Gross per 24 hour  Intake 2070.78 ml  Output      0 ml  Net 2070.78 ml    Physical Exam: General: awake, No acute distress. HEENT: EOMI. Neck: Supple CV: irr Lungs: Clear to ascultation bilaterally Abdomen: Soft, min tenderness, +BS Ext: Good pulses. Trace edema.   Lab Results:  [facility] 08/17/11 0522 08/16/11 0545  NA 140 136  K 4.1 4.3  CL 112 106  CO2 23 23  GLUCOSE 97 99  BUN 6 9  CREATININE 0.75 0.83  CALCIUM 8.7 8.4  MG -- --  PHOS -- --    Basename 08/17/11 0522 08/16/11 0545  AST 62* 87*  ALT 74* 91*  ALKPHOS 130* 124*  BILITOT 2.0* 2.6*  PROT 6.3 6.4  ALBUMIN 2.7* 2.7*   No results found for this basename: LIPASE:2,AMYLASE:2 in the last 72 hours  Basename 08/17/11 0522 08/16/11 0545  WBC 5.0 5.6  NEUTROABS -- --  HGB 9.6* 10.3*  HCT 29.8* 32.0*  MCV 87.4 86.3  PLT 126* 113*     Micro Results: No results found for this or any previous visit (from the past 240 hour(s)).  Studies/Results: Dg Ercp  08/15/2011  *RADIOLOGY REPORT*  Clinical Data: Intra and extrahepatic biliary ductal dilation. Prior cholecystectomy.  ERCP 08/15/2011:  Comparison:  CT abdomen and pelvis yesterday.  Technique:  Multiple spot images were obtained with the fluoroscopic device and submitted for interpretation post- procedure.  ERCP was performed by Dr. Ewing Schlein.  Findings: Initial image shows mild intra and extrahepatic biliary ductal dilation, with a filling  defect in the distal duct which could be a stone.  Dr. Ewing Schlein swept the duct with a balloon occlusion catheter.  Completion image shows antegrade flow into the duodenum, though there is narrowing of the distal duct.  IMPRESSION: Filling defect in the distal common bile duct consistent with a small stone.  Stricture versus spasm involving the distal common bile duct on the completion image.  These images were submitted for radiologic interpretation only. Please see the procedural report for the amount of contrast and the fluoroscopy time utilized.  Original Report Authenticated By: Arnell Sieving, M.D.    Medications: I have reviewed the patient's current medications. Scheduled Meds:    . digoxin  250 mcg Oral Daily  . metoprolol succinate  25 mg Oral Daily  . pantoprazole  40 mg Oral Q1200  . rosuvastatin  20 mg Oral q1800  . sodium chloride  3 mL Intravenous Q12H  . warfarin  5 mg Oral ONCE-1800  . warfarin  5 mg Oral ONCE-1800  . DISCONTD: pantoprazole (PROTONIX) IV  40 mg Intravenous Q1200   Continuous Infusions:    . 0.9 % NaCl with KCl 20 mEq / L 100 mL/hr at 08/17/11 1133  . heparin 2,100 Units/hr (08/17/11 0940)  . DISCONTD:  heparin 1,200 Units/hr (08/16/11 0548)   PRN Meds:.acetaminophen, acetaminophen, albuterol, HYDROcodone-acetaminophen, morphine, ondansetron (ZOFRAN) IV, ondansetron  Assessment/Plan:  Atrial fib -rate controlled continue medications (dig/metoprolol)- D/C tele   HYPERTENSION, UNSPECIFIED- stable   GERD-PO protonix   MITRAL VALVE REPLACEMENT, HX OF- restart coumadin today continue  heparin gtt til INR > 3   Abdominal pain- s/p ERCP with removal of stones-  Stopped abx and advancing diet, encouraged ambulation  Hyponatremia- watch  D/C when INR > 2.5    LOS: 3 days  Raiquan Chandler, DO 08/17/2011, 11:42 AM

## 2011-08-17 NOTE — Progress Notes (Signed)
Patient ID: [name], female   DOB: [birth year], 56 y.o.   MRN: [id number] [facility] Gastroenterology Progress Note  [name] 56 y.o. [birth year]   Subjective: Sitting in chair. No complaints. Denies abdominal pain. Tolerating solid food. Denies any further sore throate  Objective: Vital signs: Filed Vitals:   08/17/11 0939  BP: 138/69  Pulse: 72  Temp: 98.3  Resp: 17    Intake/Output last 24 hrs:  Intake/Output Summary (Last 24 hours) at 08/17/11 1014 Last data filed at 08/17/11 0900  Gross per 24 hour  Intake 2070.78 ml  Output      0 ml  Net 2070.78 ml    Physical Exam: Gen: alert, no acute distress  Abd: soft, nontender, nondistended  Lab Results:  [facility] 08/17/11 0522 08/16/11 0545  NA 140 136  K 4.1 4.3  CL 112 106  CO2 23 23  GLUCOSE 97 99  BUN 6 9  CREATININE 0.75 0.83  CALCIUM 8.7 8.4  MG -- --  PHOS -- --    Basename 08/17/11 0522 08/16/11 0545  AST 62* 87*  ALT 74* 91*  ALKPHOS 130* 124*  BILITOT 2.0* 2.6*  PROT 6.3 6.4  ALBUMIN 2.7* 2.7*    Basename 08/17/11 0522 08/16/11 0545  WBC 5.0 5.6  NEUTROABS -- --  HGB 9.6* 10.3*  HCT 29.8* 32.0*  MCV 87.4 86.3  PLT 126* 113*    Assessment/Plan: Doing well s/p ERCP on Friday for removal of CBD stones. LFTs improving. Tolerating diet. Coumadin started yesterday by primary team with planned d/c by them when INR therapeutic. Will sign off. F/U with Dr. Ewing Schlein in 2 weeks.   [name] C. 08/17/2011, 10:14 AM

## 2011-08-17 NOTE — Progress Notes (Signed)
ANTICOAGULATION CONSULT NOTE - Initial Consult  Pharmacy Consult for Heparin/Coumadin Indication: St. Jude mitral valve  No Known Allergies  Vital Signs: Temp: 98.3 F (36.8 C) (02/24 0550) Temp src: Oral (02/24 0550) BP: 138/69 mmHg (02/24 0939) Pulse Rate: 72  (02/24 0939)  Labs:  Basename 08/17/11 0522 08/16/11 2216 08/16/11 1400 08/16/11 0545 08/15/11 0146  HGB 9.6* -- -- 10.3* --  HCT 29.8* -- -- 32.0* 32.8*  PLT 126* -- -- 113* 126*  APTT -- -- -- -- --  LABPROT 18.6* -- -- 18.0* 30.0*  INR 1.52* -- -- 1.46 2.81*  HEPARINUNFRC 0.41 0.25* 0.12* -- --  CREATININE 0.75 -- -- 0.83 0.87  CKTOTAL -- -- -- -- --  CKMB -- -- -- -- --  TROPONINI -- -- -- -- --   Estimated Creatinine Clearance: 78.9 ml/min (by C-G formula based on Cr of 0.75).  Medical History: Past Medical History  Diagnosis Date  . Rheumatic heart disease   . HTN (hypertension)     unspecified  . Pulmonary HTN     mild  . Hypercholesterolemia   . GERD (gastroesophageal reflux disease)   . Goiter     multinodular  . Atrial fibrillation     Assessment: 56yof continues on heparin bridge for St. Jude mitral valve while INR subtherapeutic.  Heparin level therapeutic.  INR=1.52  Goal of Therapy:  INR 3-3.5 Heparin level 0.3-0.7   Plan:  1) Continue heparin at 2100 units / hr  2) Coumadin 5mg  x 1 3) Daily INR  Elwin Sleight 08/17/2011,10:11 AM

## 2011-08-18 ENCOUNTER — Encounter (HOSPITAL_COMMUNITY): Payer: Self-pay | Admitting: Gastroenterology

## 2011-08-18 LAB — COMPREHENSIVE METABOLIC PANEL
AST: 41 U/L — ABNORMAL HIGH (ref 0–37)
Albumin: 2.8 g/dL — ABNORMAL LOW (ref 3.5–5.2)
Chloride: 109 mEq/L (ref 96–112)
Creatinine, Ser: 0.75 mg/dL (ref 0.50–1.10)
Total Bilirubin: 1.8 mg/dL — ABNORMAL HIGH (ref 0.3–1.2)
Total Protein: 6.7 g/dL (ref 6.0–8.3)

## 2011-08-18 LAB — PROTIME-INR
INR: 1.9 — ABNORMAL HIGH (ref 0.00–1.49)
Prothrombin Time: 22.1 seconds — ABNORMAL HIGH (ref 11.6–15.2)

## 2011-08-18 LAB — CBC
MCH: 26.9 pg (ref 26.0–34.0)
Platelets: 145 10*3/uL — ABNORMAL LOW (ref 150–400)
RBC: 3.64 MIL/uL — ABNORMAL LOW (ref 3.87–5.11)
RDW: 14.5 % (ref 11.5–15.5)
WBC: 5.9 10*3/uL (ref 4.0–10.5)

## 2011-08-18 LAB — HEPARIN LEVEL (UNFRACTIONATED): Heparin Unfractionated: 0.43 IU/mL (ref 0.30–0.70)

## 2011-08-18 MED ORDER — WARFARIN SODIUM 5 MG PO TABS
5.0000 mg | ORAL_TABLET | Freq: Once | ORAL | Status: AC
  Administered 2011-08-18: 5 mg via ORAL
  Filled 2011-08-18: qty 1

## 2011-08-18 NOTE — Progress Notes (Signed)
Subjective: Awake, no new C/O, tolerating food   Objective: Vital signs in last 24 hours: Filed Vitals:   08/17/11 2140 08/18/11 0018 08/18/11 0514 08/18/11 1004  BP: 130/80 154/99 152/82 145/80  Pulse: 67 114 86 84  Temp: 98 F (36.7 C) 98.1 F (36.7 C) 98.2 F (36.8 C)   TempSrc: Oral Oral Oral   Resp: 20 22 18    Height:  5\' 7"  (1.702 m)    Weight:  82.9 kg (182 lb 12.2 oz) 82.5 kg (181 lb 14.1 oz)   SpO2: 96% 100% 94%    Weight change: 1.9 kg (4 lb 3 oz)  Intake/Output Summary (Last 24 hours) at 08/18/11 1234 Last data filed at 08/18/11 0900  Gross per 24 hour  Intake 3018.09 ml  Output      2 ml  Net 3016.09 ml    Physical Exam: General: awake, No acute distress. HEENT: EOMI. Neck: Supple CV: irr Lungs: Clear to ascultation bilaterally Abdomen: Soft, min tenderness, +BS Ext: Good pulses. No edema.   Lab Results:  [facility] 08/18/11 0545 08/17/11 0522  NA 139 140  K 4.5 4.1  CL 109 112  CO2 24 23  GLUCOSE 100* 97  BUN 4* 6  CREATININE 0.75 0.75  CALCIUM 9.1 8.7  MG -- --  PHOS -- --    Basename 08/18/11 0545 08/17/11 0522  AST 41* 62*  ALT 62* 74*  ALKPHOS 142* 130*  BILITOT 1.8* 2.0*  PROT 6.7 6.3  ALBUMIN 2.8* 2.7*   No results found for this basename: LIPASE:2,AMYLASE:2 in the last 72 hours  Basename 08/18/11 0545 08/17/11 0522  WBC 5.9 5.0  NEUTROABS -- --  HGB 9.8* 9.6*  HCT 31.5* 29.8*  MCV 86.5 87.4  PLT 145* 126*     Micro Results: No results found for this or any previous visit (from the past 240 hour(s)).  Studies/Results: No results found.  Medications: I have reviewed the patient's current medications. Scheduled Meds:    . digoxin  250 mcg Oral Daily  . metoprolol succinate  25 mg Oral Daily  . pantoprazole  40 mg Oral Q1200  . rosuvastatin  20 mg Oral q1800  . sodium chloride  3 mL Intravenous Q12H  . warfarin  5 mg Oral ONCE-1800  . warfarin  5 mg Oral ONCE-1800   Continuous Infusions:    . 0.9 % NaCl with  KCl 20 mEq / L 100 mL/hr at 08/18/11 1036  . heparin 2,100 Units/hr (08/18/11 1035)   PRN Meds:.acetaminophen, acetaminophen, albuterol, HYDROcodone-acetaminophen, morphine, ondansetron (ZOFRAN) IV, ondansetron  Assessment/Plan:  Atrial fib -rate controlled continue medications (dig/metoprolol)- D/C tele   HYPERTENSION, UNSPECIFIED- stable   GERD-PO protonix   MITRAL VALVE REPLACEMENT, HX OF- restart coumadin today continue  heparin gtt til INR > 3   Abdominal pain- s/p ERCP with removal of stones-  Stopped abx and advancing diet, encouraged ambulation  Hyponatremia- watch  D/C when INR > 2.5    LOS: 4 days  Joanna Tosi, DO 08/18/2011, 12:34 PM

## 2011-08-18 NOTE — Progress Notes (Signed)
ANTICOAGULATION CONSULT NOTE - Follow Up Consult  Pharmacy Consult for Heparin and Coumadin Indication: St. Jude MVR and Afib  Assessment: 56 yo female on heparin to Coumadin bridge for a St. Jude MVR and Afib. Heparin level is therapeutic and INR is below goal. No bleeding noted. CBC is stable.  Goal of Therapy:  INR 3-3.5 Heparin level 0.3-0.7   Plan:  1. Continue heparin at 2100 units/hr (21 ml/hr) 2. Coumadin 5 mg po tonight 3. INR, CBC, heparin level daily   No Known Allergies  Patient Measurements: Height: 5\' 7"  (170.2 cm) Weight: 181 lb 14.1 oz (82.5 kg) IBW/kg (Calculated) : 61.6  Heparin Dosing Weight: 78.6 kg  Vital Signs: Temp: 98.2 F (36.8 C) (02/25 0514) Temp src: Oral (02/25 0514) BP: 145/80 mmHg (02/25 1004) Pulse Rate: 84  (02/25 1004)  Labs:  Basename 08/18/11 0545 08/17/11 0522 08/16/11 2216 08/16/11 0545  HGB 9.8* 9.6* -- --  HCT 31.5* 29.8* -- 32.0*  PLT 145* 126* -- 113*  APTT -- -- -- --  LABPROT 22.1* 18.6* -- 18.0*  INR 1.90* 1.52* -- 1.46  HEPARINUNFRC 0.43 0.41 0.25* --  CREATININE 0.75 0.75 -- 0.83  CKTOTAL -- -- -- --  CKMB -- -- -- --  TROPONINI -- -- -- --   Estimated Creatinine Clearance: 84.7 ml/min (by C-G formula based on Cr of 0.75).   Medications:  Scheduled:    . digoxin  250 mcg Oral Daily  . metoprolol succinate  25 mg Oral Daily  . pantoprazole  40 mg Oral Q1200  . rosuvastatin  20 mg Oral q1800  . sodium chloride  3 mL Intravenous Q12H  . warfarin  5 mg Oral ONCE-1800   Infusions:    . 0.9 % NaCl with KCl 20 mEq / L 100 mL/hr at 08/18/11 1036  . heparin 2,100 Units/hr (08/18/11 1035)    Loura Back Danielle 08/18/2011,11:35 AM

## 2011-08-19 LAB — PROTIME-INR
INR: 2.24 — ABNORMAL HIGH (ref 0.00–1.49)
Prothrombin Time: 25.2 seconds — ABNORMAL HIGH (ref 11.6–15.2)

## 2011-08-19 LAB — HEPARIN LEVEL (UNFRACTIONATED): Heparin Unfractionated: 0.46 IU/mL (ref 0.30–0.70)

## 2011-08-19 MED ORDER — WARFARIN SODIUM 5 MG PO TABS
5.0000 mg | ORAL_TABLET | Freq: Once | ORAL | Status: AC
  Administered 2011-08-19: 5 mg via ORAL
  Filled 2011-08-19: qty 1

## 2011-08-19 NOTE — Progress Notes (Signed)
Subjective: Awake, no new C/O, tolerating food Wanting to go home   Objective: Vital signs in last 24 hours: Filed Vitals:   08/18/11 2212 08/19/11 0459 08/19/11 1027 08/19/11 1324  BP: 138/79 131/78  165/77  Pulse: 62 59 65 65  Temp: 98.5 F (36.9 C) 98.2 F (36.8 C)  97.9 F (36.6 C)  TempSrc: Oral Oral    Resp: 22 20  18   Height:      Weight:  82.2 kg (181 lb 3.5 oz)    SpO2: 96% 94%  96%   Weight change: -0.7 kg (-1 lb 8.7 oz)  Intake/Output Summary (Last 24 hours) at 08/19/11 1423 Last data filed at 08/19/11 1325  Gross per 24 hour  Intake   1600 ml  Output      0 ml  Net   1600 ml    Physical Exam: General: awake, No acute distress. HEENT: EOMI. Neck: Supple CV: irr Lungs: Clear to ascultation bilaterally Abdomen: Soft, min tenderness, +BS Ext: Good pulses. No edema.   Lab Results:  [facility] 08/18/11 0545 08/17/11 0522  NA 139 140  K 4.5 4.1  CL 109 112  CO2 24 23  GLUCOSE 100* 97  BUN 4* 6  CREATININE 0.75 0.75  CALCIUM 9.1 8.7  MG -- --  PHOS -- --    Basename 08/18/11 0545 08/17/11 0522  AST 41* 62*  ALT 62* 74*  ALKPHOS 142* 130*  BILITOT 1.8* 2.0*  PROT 6.7 6.3  ALBUMIN 2.8* 2.7*   No results found for this basename: LIPASE:2,AMYLASE:2 in the last 72 hours  Basename 08/18/11 0545 08/17/11 0522  WBC 5.9 5.0  NEUTROABS -- --  HGB 9.8* 9.6*  HCT 31.5* 29.8*  MCV 86.5 87.4  PLT 145* 126*     Micro Results: No results found for this or any previous visit (from the past 240 hour(s)).  Studies/Results: No results found.  Medications: I have reviewed the patient's current medications. Scheduled Meds:    . digoxin  250 mcg Oral Daily  . metoprolol succinate  25 mg Oral Daily  . pantoprazole  40 mg Oral Q1200  . rosuvastatin  20 mg Oral q1800  . sodium chloride  3 mL Intravenous Q12H  . warfarin  5 mg Oral ONCE-1800  . warfarin  5 mg Oral ONCE-1800   Continuous Infusions:    . 0.9 % NaCl with KCl 20 mEq / L 100 mL/hr at  08/19/11 9604  . heparin 2,100 Units/hr (08/19/11 1228)   PRN Meds:.acetaminophen, acetaminophen, albuterol, HYDROcodone-acetaminophen, morphine, ondansetron (ZOFRAN) IV, ondansetron  Assessment/Plan:  Atrial fib -rate controlled continue medications (dig/metoprolol)- D/C tele   HYPERTENSION, UNSPECIFIED- stable   GERD-PO protonix   MITRAL VALVE REPLACEMENT, HX OF- restart coumadin today continue  heparin gtt til INR > 2.5   Abdominal pain- s/p ERCP with removal of stones-  Stopped abx and advancing diet, encouraged ambulation  Hyponatremia- watch  D/C when INR > 2.5- hope for tomm    LOS: 5 days  Ayaat Jansma, DO 08/19/2011, 2:23 PM

## 2011-08-19 NOTE — Progress Notes (Signed)
ANTICOAGULATION CONSULT NOTE - Follow Up Consult  Pharmacy Consult for Heparin and Coumadin Indication: St. Jude MVR and Afib  Assessment: 56 yo female on heparin to Coumadin bridge for a St. Jude MVR and Afib. Heparin level is therapeutic and INR remains below goal. No bleeding noted.  Goal of Therapy:  INR 3-3.5 Heparin level 0.3-0.7   Plan:  1. Continue heparin at 2100 units/hr (21 ml/hr) 2. Coumadin 5 mg po tonight 3. INR, CBC, heparin level daily   No Known Allergies  Patient Measurements: Height: 5\' 7"  (170.2 cm) Weight: 181 lb 3.5 oz (82.2 kg) IBW/kg (Calculated) : 61.6  Heparin Dosing Weight: 78.6 kg  Vital Signs: Temp: 98.2 F (36.8 C) (02/26 0459) Temp src: Oral (02/26 0459) BP: 131/78 mmHg (02/26 0459) Pulse Rate: 65  (02/26 1027)  Labs:  Basename 08/19/11 0618 08/18/11 0545 08/17/11 0522  HGB -- 9.8* 9.6*  HCT -- 31.5* 29.8*  PLT -- 145* 126*  APTT -- -- --  LABPROT 25.2* 22.1* 18.6*  INR 2.24* 1.90* 1.52*  HEPARINUNFRC 0.46 0.43 0.41  CREATININE -- 0.75 0.75  CKTOTAL -- -- --  CKMB -- -- --  TROPONINI -- -- --   Estimated Creatinine Clearance: 84.5 ml/min (by C-G formula based on Cr of 0.75).   Medications:  Scheduled:     . digoxin  250 mcg Oral Daily  . metoprolol succinate  25 mg Oral Daily  . pantoprazole  40 mg Oral Q1200  . rosuvastatin  20 mg Oral q1800  . sodium chloride  3 mL Intravenous Q12H  . warfarin  5 mg Oral ONCE-1800   Infusions:     . 0.9 % NaCl with KCl 20 mEq / L 100 mL/hr at 08/19/11 0656  . heparin 2,100 Units/hr (08/18/11 2244)    Rolland Porter, Pharm.D., BCPS Clinical Pharmacist Pager: [phone]

## 2011-08-20 LAB — CBC
HCT: 31.6 % — ABNORMAL LOW (ref 36.0–46.0)
MCH: 27.5 pg (ref 26.0–34.0)
MCHC: 31.6 g/dL (ref 30.0–36.0)
MCV: 86.8 fL (ref 78.0–100.0)
Platelets: 172 10*3/uL (ref 150–400)
RDW: 14.7 % (ref 11.5–15.5)
WBC: 7.5 10*3/uL (ref 4.0–10.5)

## 2011-08-20 LAB — HEPARIN LEVEL (UNFRACTIONATED): Heparin Unfractionated: 0.3 IU/mL (ref 0.30–0.70)

## 2011-08-20 MED ORDER — WARFARIN SODIUM 7.5 MG PO TABS
7.5000 mg | ORAL_TABLET | Freq: Once | ORAL | Status: AC
  Administered 2011-08-20: 7.5 mg via ORAL
  Filled 2011-08-20: qty 1

## 2011-08-20 MED ORDER — WARFARIN - PHARMACIST DOSING INPATIENT
Freq: Every day | Status: DC
  Filled 2011-08-20 (×3): qty 1

## 2011-08-20 NOTE — Progress Notes (Addendum)
Subjective: The patient denies any complaints at all today  Objective: Vital signs in last 24 hours: Filed Vitals:   08/19/11 2159 08/20/11 0604 08/20/11 0952 08/20/11 1420  BP: 168/99 160/84  146/71  Pulse: 67 68 64 64  Temp: 98.1 F (36.7 C) 98.2 F (36.8 C)  98.2 F (36.8 C)  TempSrc: Oral Oral    Resp: 18 17  18   Height:      Weight:  82.6 kg (182 lb 1.6 oz)    SpO2: 95% 95%  96%    Intake/Output Summary (Last 24 hours) at 08/20/11 1940 Last data filed at 08/20/11 1841  Gross per 24 hour  Intake    600 ml  Output      0 ml  Net    600 ml    Weight change: 0.4 kg (14.1 oz)  General: awake, No acute distress.  HEENT: EOMI.  Neck: Supple  CV: irr  Lungs: Clear to ascultation bilaterally  Abdomen: Soft, min tenderness, +BS  Ext: Good pulses. No edema.      Lab Results: Results for orders placed during the hospital encounter of 08/14/11 (from the past 24 hour(s))  HEPARIN LEVEL (UNFRACTIONATED)     Status: Normal   Collection Time   08/20/11  5:05 AM      Component Value Range   Heparin Unfractionated 0.30  0.30 - 0.70 (IU/mL)  PROTIME-INR     Status: Abnormal   Collection Time   08/20/11  5:05 AM      Component Value Range   Prothrombin Time 24.3 (*) 11.6 - 15.2 (seconds)   INR 2.14 (*) 0.00 - 1.49   CBC     Status: Abnormal   Collection Time   08/20/11  5:05 AM      Component Value Range   WBC 7.5  4.0 - 10.5 (K/uL)   RBC 3.64 (*) 3.87 - 5.11 (MIL/uL)   Hemoglobin 10.0 (*) 12.0 - 15.0 (g/dL)   HCT 16.1 (*) 09.6 - 46.0 (%)   MCV 86.8  78.0 - 100.0 (fL)   MCH 27.5  26.0 - 34.0 (pg)   MCHC 31.6  30.0 - 36.0 (g/dL)   RDW 04.5  40.9 - 81.1 (%)   Platelets 172  150 - 400 (K/uL)     Micro: No results found for this or any previous visit (from the past 240 hour(s)).  Studies/Results: No results found.  Medications:  Scheduled Meds:   . digoxin  250 mcg Oral Daily  . metoprolol succinate  25 mg Oral Daily  . pantoprazole  40 mg Oral Q1200  .  rosuvastatin  20 mg Oral q1800  . sodium chloride  3 mL Intravenous Q12H  . warfarin  7.5 mg Oral ONCE-1800  . Warfarin - Pharmacist Dosing Inpatient   Does not apply q1800   Continuous Infusions:   . 0.9 % NaCl with KCl 20 mEq / L 100 mL/hr at 08/20/11 1344  . heparin 2,150 Units/hr (08/20/11 1546)   PRN Meds:.acetaminophen, acetaminophen, albuterol, HYDROcodone-acetaminophen, morphine, ondansetron (ZOFRAN) IV, ondansetron   Assessment:   Atrial fib -rate controlled continue medications (dig/metoprolol)- D/C tele  HYPERTENSION, UNSPECIFIED- stable  GERD-PO protonix  MITRAL VALVE REPLACEMENT, HX OF- restart coumadin today continue heparin gtt til INR >3. The patient this is the cutoff INR  recommended by Dr. wall. Recommendations heparin drip needs to be continued. Abdominal pain- s/p ERCP with removal of stones- Stopped abx and advancing diet, encouraged ambulation  Hyponatremia- watch  D/C when  INR > 3- hope for tomm        LOS: 6 days   [facility] 08/20/2011, 7:40 PM

## 2011-08-20 NOTE — Progress Notes (Signed)
ANTICOAGULATION CONSULT NOTE - Follow Up Consult  Pharmacy Consult for UFH/Coumadin Indication: atrial fibrillation and MVR  No Known Allergies  Patient Measurements: Height: 5\' 7"  (170.2 cm) Weight: 182 lb 1.6 oz (82.6 kg) IBW/kg (Calculated) : 61.6   Vital Signs: Temp: 98.2 F (36.8 C) (02/27 0604) Temp src: Oral (02/27 0604) BP: 160/84 mmHg (02/27 0604) Pulse Rate: 64  (02/27 0952)  Labs:  Basename 08/20/11 0505 08/19/11 0618 08/18/11 0545  HGB 10.0* -- 9.8*  HCT 31.6* -- 31.5*  PLT 172 -- 145*  APTT -- -- --  LABPROT 24.3* 25.2* 22.1*  INR 2.14* 2.24* 1.90*  HEPARINUNFRC 0.30 0.46 0.43  CREATININE -- -- 0.75  CKTOTAL -- -- --  CKMB -- -- --  TROPONINI -- -- --   Estimated Creatinine Clearance: 84.7 ml/min (by C-G formula based on Cr of 0.75).   Medications:  Scheduled:    . digoxin  250 mcg Oral Daily  . metoprolol succinate  25 mg Oral Daily  . pantoprazole  40 mg Oral Q1200  . rosuvastatin  20 mg Oral q1800  . sodium chloride  3 mL Intravenous Q12H  . warfarin  5 mg Oral ONCE-1800   Infusions:    . 0.9 % NaCl with KCl 20 mEq / L 100 mL/hr at 08/20/11 0237  . heparin 2,100 Units/hr (08/20/11 0237)    Assessment: 56 y/o female patient receiving heparin/coumadin for h/o MVR with afib. Heparin level therapeutic but on low side of goal, will increase rate. INR subtherapeutic, not moving, will increase dose. No bleeding reported.  Goal of Therapy:  Heparin level 0.3-0.7 units/ml INR=3-3.5   Plan:  Increase heparin gtt to 2150 units/hr and give coumadin 7.5mg  today. F/u in am.  Verlene Mayer, PharmD, BCPS Pager [phone] 08/20/2011,10:39 AM

## 2011-08-20 NOTE — Progress Notes (Signed)
   CARE MANAGEMENT NOTE 08/20/2011  Patient:  [name], [name]   Account Number:  [id number]  Date Initiated:  08/15/2011  Documentation initiated by:  Donn Pierini  Subjective/Objective Assessment:   Pt admitted with abdominal pain, ERCP done today 08/15/11     Action/Plan:   PTA pt lived at home with family,   Anticipated DC Date:  08/16/2011   Anticipated DC Plan:  HOME/SELF CARE      DC Planning Services  CM consult      Choice offered to / List presented to:             Status of service:  In process, will continue to follow Medicare Important Message given?   (If response is "NO", the following Medicare IM given date fields will be blank) Date Medicare IM given:   Date Additional Medicare IM given:    Discharge Disposition:    Per UR Regulation:    Comments:  PCP- [name]  08/20/11- 1230- Donn Pierini RN, BSN [phone] Spoke with pt at bedside regarding d/c needs. Per conversation pt states that she lives alone with family very near by on same property. She is followed by Dr. Daleen Squibb for cards. and the coumadin clinic- in fact she has an appointment for this Friday at 4:15. Discussed possibility of Lovenox- pt states that she has done Lovenox in the past and would be comfortable with Lovenox- per benefits check pt's insurance does cover Lovenox as a tier 3 drug with copay. Pt states that if she is able to go home with Lovenox she would prefer to go to coumadin clinic for PT/INR check instead of doing HH-but is open to do whatever she needs to do. Pt uses [facility] in [place] ([address]) for medications, has medication coverage with insurance. Will f/u with MD for possible d/c with Lovenox. 1240-spoke with Dr. Susie Cassette- pt will need to stay on IV heparin until INR greater than 2.5 will not be able to d/c on Lovenox.  08/15/11- 1500- Donn Pierini RN, BSN [phone] Attempted to see pt in room, pt in procedure- do not anticipate any d/c needs. CM to follow.  Plan to d/c home when medically stable

## 2011-08-21 LAB — CBC
HCT: 30.9 % — ABNORMAL LOW (ref 36.0–46.0)
Hemoglobin: 9.7 g/dL — ABNORMAL LOW (ref 12.0–15.0)
MCHC: 31.4 g/dL (ref 30.0–36.0)
MCV: 87.8 fL (ref 78.0–100.0)
RDW: 15.1 % (ref 11.5–15.5)

## 2011-08-21 MED ORDER — WARFARIN SODIUM 7.5 MG PO TABS
7.5000 mg | ORAL_TABLET | Freq: Once | ORAL | Status: AC
  Administered 2011-08-21: 7.5 mg via ORAL
  Filled 2011-08-21: qty 1

## 2011-08-21 NOTE — Progress Notes (Signed)
Subjective: No complaints  Objective: Vital signs in last 24 hours: Filed Vitals:   08/20/11 1420 08/20/11 2133 08/21/11 0522 08/21/11 1416  BP: 146/71 137/80 168/94 138/72  Pulse: 64 71 80 81  Temp: 98.2 F (36.8 C) 98 F (36.7 C) 97.8 F (36.6 C) 98.1 F (36.7 C)  TempSrc:  Oral Oral Oral  Resp: 18 22 20 20   Height:      Weight:   80.4 kg (177 lb 4 oz)   SpO2: 96% 96% 94% 95%    Intake/Output Summary (Last 24 hours) at 08/21/11 2152 Last data filed at 08/21/11 1857  Gross per 24 hour  Intake 9902.3 ml  Output      0 ml  Net 9902.3 ml    Weight change: -2.2 kg (-4 lb 13.6 oz)  General: awake, No acute distress.  HEENT: EOMI.  Neck: Supple  CV: irr  Lungs: Clear to ascultation bilaterally  Abdomen: Soft, min tenderness, +BS  Ext: Good pulses. No edema.       Lab Results: Results for orders placed during the hospital encounter of 08/14/11 (from the past 24 hour(s))  HEPARIN LEVEL (UNFRACTIONATED)     Status: Normal   Collection Time   08/21/11  5:00 AM      Component Value Range   Heparin Unfractionated 0.54  0.30 - 0.70 (IU/mL)  PROTIME-INR     Status: Abnormal   Collection Time   08/21/11  5:00 AM      Component Value Range   Prothrombin Time 27.0 (*) 11.6 - 15.2 (seconds)   INR 2.45 (*) 0.00 - 1.49   CBC     Status: Abnormal   Collection Time   08/21/11  5:00 AM      Component Value Range   WBC 7.2  4.0 - 10.5 (K/uL)   RBC 3.52 (*) 3.87 - 5.11 (MIL/uL)   Hemoglobin 9.7 (*) 12.0 - 15.0 (g/dL)   HCT 16.1 (*) 09.6 - 46.0 (%)   MCV 87.8  78.0 - 100.0 (fL)   MCH 27.6  26.0 - 34.0 (pg)   MCHC 31.4  30.0 - 36.0 (g/dL)   RDW 04.5  40.9 - 81.1 (%)   Platelets 170  150 - 400 (K/uL)     Micro: No results found for this or any previous visit (from the past 240 hour(s)).  Studies/Results: No results found.  Medications:  Scheduled Meds:   . digoxin  250 mcg Oral Daily  . metoprolol succinate  25 mg Oral Daily  . pantoprazole  40 mg Oral Q1200  .  rosuvastatin  20 mg Oral q1800  . sodium chloride  3 mL Intravenous Q12H  . warfarin  7.5 mg Oral ONCE-1800  . Warfarin - Pharmacist Dosing Inpatient   Does not apply q1800   Continuous Infusions:   . 0.9 % NaCl with KCl 20 mEq / L 100 mL/hr at 08/21/11 2027  . heparin 2,150 Units/hr (08/21/11 1726)   PRN Meds:.acetaminophen, acetaminophen, albuterol, HYDROcodone-acetaminophen, morphine, ondansetron (ZOFRAN) IV, ondansetron   Assessment: Active Problems:  HYPERTENSION, UNSPECIFIED  Atrial fibrillation  GERD  MITRAL VALVE REPLACEMENT, HX OF  Abdominal pain, acute, epigastric  Choledocholithiasis  Acute cholangitis  Anticoagulated on Coumadin   Plan:  Atrial fib -rate controlled continue medications (dig/metoprolol)- D/C tele  HYPERTENSION, UNSPECIFIED- stable  GERD-PO protonix  MITRAL VALVE REPLACEMENT, HX OF- restart coumadin today continue heparin gtt til INR >3. The patient this is the cutoff INR recommended by Dr. wall. Recommendations heparin drip  needs to be continued.  Abdominal pain- s/p ERCP with removal of stones- Stopped abx and advancing diet, encouraged ambulation  Hyponatremia- watch  D/C when INR > 3- hopefully tomorrow       LOS: 7 days   [facility] 08/21/2011, 9:52 PM

## 2011-08-21 NOTE — Progress Notes (Signed)
ANTICOAGULATION CONSULT NOTE - Follow Up Consult  Pharmacy Consult for Heparin and Coumadin Indication: St. Jude MVR and Afib  Assessment: 56 yo female on heparin to Coumadin bridge for a St. Jude MVR and Afib. Heparin level is therapeutic and INR is below goal but trending up. No bleeding noted. CBC is stable.  Goal of Therapy:  INR 3-3.5 Heparin level 0.3-0.7   Plan:  1. Continue heparin at 2150 units/hr (21.5 ml/hr) 2. Coumadin 7.5 mg po tonight 3. INR, CBC, heparin level daily   No Known Allergies  Patient Measurements: Height: 5\' 7"  (170.2 cm) Weight: 177 lb 4 oz (80.4 kg) IBW/kg (Calculated) : 61.6  Heparin Dosing Weight: 78.6 kg  Vital Signs: Temp: 97.8 F (36.6 C) (02/28 0522) Temp src: Oral (02/28 0522) BP: 168/94 mmHg (02/28 0522) Pulse Rate: 80  (02/28 0522)  Labs:  Basename 08/21/11 0500 08/20/11 0505 08/19/11 0618  HGB 9.7* 10.0* --  HCT 30.9* 31.6* --  PLT 170 172 --  APTT -- -- --  LABPROT 27.0* 24.3* 25.2*  INR 2.45* 2.14* 2.24*  HEPARINUNFRC 0.54 0.30 0.46  CREATININE -- -- --  CKTOTAL -- -- --  CKMB -- -- --  TROPONINI -- -- --   Estimated Creatinine Clearance: 83.6 ml/min (by C-G formula based on Cr of 0.75).   Medications:  Scheduled:     . digoxin  250 mcg Oral Daily  . metoprolol succinate  25 mg Oral Daily  . pantoprazole  40 mg Oral Q1200  . rosuvastatin  20 mg Oral q1800  . sodium chloride  3 mL Intravenous Q12H  . warfarin  7.5 mg Oral ONCE-1800  . Warfarin - Pharmacist Dosing Inpatient   Does not apply q1800   Infusions:     . 0.9 % NaCl with KCl 20 mEq / L 100 mL/hr at 08/21/11 1014  . heparin 2,150 Units/hr (08/21/11 0545)    Lovell Sheehan 08/21/2011,10:57 AM

## 2011-08-22 LAB — CBC
HCT: 30.5 % — ABNORMAL LOW (ref 36.0–46.0)
Hemoglobin: 9.7 g/dL — ABNORMAL LOW (ref 12.0–15.0)
RBC: 3.49 MIL/uL — ABNORMAL LOW (ref 3.87–5.11)
RDW: 15.3 % (ref 11.5–15.5)
WBC: 6.7 10*3/uL (ref 4.0–10.5)

## 2011-08-22 LAB — PROTIME-INR: Prothrombin Time: 31.8 seconds — ABNORMAL HIGH (ref 11.6–15.2)

## 2011-08-22 MED ORDER — HYDROCODONE-ACETAMINOPHEN 5-325 MG PO TABS: 1.0000 | ORAL_TABLET | ORAL | Status: AC | PRN

## 2011-08-22 MED ORDER — WARFARIN SODIUM 2.5 MG PO TABS
2.5000 mg | ORAL_TABLET | Freq: Once | ORAL | Status: AC
  Administered 2011-08-22: 2.5 mg via ORAL
  Filled 2011-08-22 (×2): qty 1

## 2011-08-22 MED ORDER — WARFARIN SODIUM 5 MG PO TABS: ORAL_TABLET | ORAL | Status: DC

## 2011-08-22 MED ORDER — PANTOPRAZOLE SODIUM 40 MG PO TBEC: 40.0000 mg | DELAYED_RELEASE_TABLET | Freq: Every day | ORAL | Status: DC

## 2011-08-22 NOTE — Progress Notes (Signed)
   CARE MANAGEMENT NOTE 08/22/2011  Patient:  [name], [name]   Account Number:  [id number]  Date Initiated:  08/15/2011  Documentation initiated by:  Donn Pierini  Subjective/Objective Assessment:   Pt admitted with abdominal pain, ERCP done today 08/15/11     Action/Plan:   PTA pt lived at home with family,   Anticipated DC Date:  08/22/2011   Anticipated DC Plan:  HOME/SELF CARE      DC Planning Services  CM consult      Choice offered to / List presented to:             Status of service:  Completed, signed off Medicare Important Message given?   (If response is "NO", the following Medicare IM given date fields will be blank) Date Medicare IM given:   Date Additional Medicare IM given:    Discharge Disposition:  HOME/SELF CARE  Per UR Regulation:    Comments:  PCP- [name]  08/22/11 14:36 [name] RN, BSN [phone] patient for dc today, patient has pt/inr appt scheduled on 3/4 at 4:30 at [facility], no other needs identified.  08/20/11- 1230- Donn Pierini RN, BSN [phone] Spoke with pt at bedside regarding d/c needs. Per conversation pt states that she lives alone with family very near by on same property. She is followed by Dr. Daleen Squibb for cards. and the coumadin clinic- in fact she has an appointment for this Friday at 4:15. Discussed possibility of Lovenox- pt states that she has done Lovenox in the past and would be comfortable with Lovenox- per benefits check pt's insurance does cover Lovenox as a tier 3 drug with copay. Pt states that if she is able to go home with Lovenox she would prefer to go to coumadin clinic for PT/INR check instead of doing HH-but is open to do whatever she needs to do. Will f/u with MD for possible d/c with Lovenox. 1240-spoke with Dr. Susie Cassette- pt will need to stay on IV heparin until INR greater than 2.5 will not be able to d/c on Lovenox.  08/15/11- 1500- Donn Pierini RN, BSN [phone] Attempted to see pt in room,  pt in procedure- do not anticipate any d/c needs. CM to follow. Plan to d/c home when medically stable

## 2011-08-22 NOTE — Discharge Summary (Signed)
Physician Discharge Summary  [name] MRN: [id number] DOB/AGE: Feb 18, 1956 56 y.o.  PCP: [name], MD, MD   Admit date: 08/14/2011 Discharge date: 08/22/2011  Discharge Diagnoses:   HYPERTENSION, UNSPECIFIED  Atrial fibrillation  GERD  MITRAL VALVE REPLACEMENT, HX OF  Abdominal pain, acute, epigastric  Choledocholithiasis  Acute cholangitis  Anticoagulated on Coumadin   Medication List  As of 08/22/2011  1:06 PM   TAKE these medications         atorvastatin 40 MG tablet   Commonly known as: LIPITOR   Take 1 tablet (40 mg total) by mouth daily.      digoxin 0.25 MG tablet   Commonly known as: LANOXIN   Take 1 tablet (250 mcg total) by mouth daily.      HYDROcodone-acetaminophen 5-325 MG per tablet   Commonly known as: NORCO   Take 1-2 tablets by mouth every 4 (four) hours as needed.      metoprolol succinate 25 MG 24 hr tablet   Commonly known as: TOPROL-XL   TAKE ONE TABLET BY MOUTH EVERY DAY      ondansetron 8 MG tablet   Commonly known as: ZOFRAN   Take by mouth every 12 (twelve) hours as needed. For nausea      pantoprazole 40 MG tablet   Commonly known as: PROTONIX   Take 1 tablet (40 mg total) by mouth daily at 12 noon.      PRILOSEC 20 MG capsule   Generic drug: omeprazole   Take 20 mg by mouth daily.      warfarin 5 MG tablet   Commonly known as: COUMADIN   Take as directed by Anticoagulation clinic            Discharge Condition: stable  Disposition: 01-Home or Self Care   Consults: none   Significant Diagnostic Studies: Ct Abdomen Pelvis W Contrast  08/14/2011  *RADIOLOGY REPORT*  Clinical Data: Epigastric pain.  CT ABDOMEN AND PELVIS WITH CONTRAST  Technique:  Multidetector CT imaging of the abdomen and pelvis was performed following the standard protocol during bolus administration of intravenous contrast.  Contrast: 80mL OMNIPAQUE IOHEXOL 300 MG/ML IV SOLN  Comparison: CT of abdomen and pelvis 03/10/2008.  Findings:   Lung Bases: Dependent atelectasis in the lower lobe to the lungs bilaterally.  Heart is moderately enlarged (with severe dilatation of the left atrium).  Status post mechanical mitral valve replacement. There is a small hiatal hernia.  Abdomen/Pelvis:  Compared to the prior examination there is extensive pneumobilia (although the gas extends into the periphery of the hepatic parenchyma, it is consistently noted adjacent to branches of the portal veins, and is not consistent with portal venous gas).  Mild - moderate intrahepatic biliary ductal dilatation is present, new compared to the prior examination.  As well, the common bile duct measures up to 17 mm in the porta hepatis.  The patient status post cholecystectomy.  Notably, in the gallbladder fossa and porta hepatis there is ill-defined fluid attenuation and stranding, suggesting inflammation, potentially from cholangitis.  The portal vein is widely patent as is the SMV. Pancreas is unremarkable in appearance.  Specifically, no inflammatory changes and no pancreatic ductal dilatation. Splenomegaly (16.8 cm AP) is new compared to the prior examination. The diffuse appearance of the adrenal glands and kidneys bilaterally is unremarkable.  No ascites or pneumoperitoneum and no pathologic distension of bowel.  No pathologic lymphadenopathy.  Numerous colonic diverticula are present, predominately in the region of the sigmoid  colon, without surrounding inflammatory changes to suggest acute diverticulitis at this time.  Normal appendix.  Uterus, the bilateral ovaries and urinary bladder are unremarkable in appearance.  Musculoskeletal: Irregularity of the anterior aspect of the right ilium is most likely to be post traumatic. There are no aggressive appearing lytic or blastic lesions noted in the visualized portions of the skeleton.  IMPRESSION: 1.  Interval development of common bile duct dilatation and moderate intrahepatic biliary ductal dilatation, with  inflammatory changes in the gallbladder fossa (status post cholecystectomy) and porta hepatis.  Findings are concerning for inflammatory process such as cholangitis. 2.  Marked interval increase in amount of pneumobilia compared to the prior examination (the patient has reportedly had prior common bile duct stent placements and has therefore had a sphincterotomy). 3.  Moderate cardiomegaly with severe left atrial dilatation.  The patient is status post mechanical mitral valve replacement. 4.  New splenomegaly. 5.  Small hiatal hernia. 6.  Colonic diverticulosis without findings to suggest acute diverticulitis.  These results were called by telephone on 08/14/2011  at  02:00 p.m. to  Dr. Weldon Inches, who verbally acknowledged these results.  Original Report Authenticated By: Florencia Reasons, M.D.   Dg Ercp  08/15/2011  *RADIOLOGY REPORT*  Clinical Data: Intra and extrahepatic biliary ductal dilation. Prior cholecystectomy.  ERCP 08/15/2011:  Comparison:  CT abdomen and pelvis yesterday.  Technique:  Multiple spot images were obtained with the fluoroscopic device and submitted for interpretation post- procedure.  ERCP was performed by Dr. Ewing Schlein.  Findings: Initial image shows mild intra and extrahepatic biliary ductal dilation, with a filling defect in the distal duct which could be a stone.  Dr. Ewing Schlein swept the duct with a balloon occlusion catheter.  Completion image shows antegrade flow into the duodenum, though there is narrowing of the distal duct.  IMPRESSION: Filling defect in the distal common bile duct consistent with a small stone.  Stricture versus spasm involving the distal common bile duct on the completion image.  These images were submitted for radiologic interpretation only. Please see the procedural report for the amount of contrast and the fluoroscopy time utilized.  Original Report Authenticated By: Arnell Sieving, M.D.   Dg Abd Acute W/chest  08/14/2011  *RADIOLOGY REPORT*  Clinical  Data: Epigastric pain and nausea  ACUTE ABDOMEN SERIES (ABDOMEN 2 VIEW & CHEST 1 VIEW)  Comparison: August 21, 2007 and April 26, 2010  Findings: Mild cardiomegaly is unchanged.  There is evidence of prior valve replacement surgery, stable in appearance.  The mediastinum and pulmonary vasculature are within normal limits. Both lungs are clear.  The stool and bowel gas pattern is within normal limits with no evidence of obstruction.  No pneumoperitoneum.  There is no gross organomegaly.  No abnormal calcifications are present over the abdomen or pelvis.  No osseous lesions.  IMPRESSION: Stable, mild cardiomegaly.  Normal stool and bowel gas pattern.  Original Report Authenticated By: Brandon Melnick, M.D.     Microbiology: No results found for this or any previous visit (from the past 240 hour(s)).   Labs: Results for orders placed during the hospital encounter of 08/14/11 (from the past 48 hour(s))  HEPARIN LEVEL (UNFRACTIONATED)     Status: Normal   Collection Time   08/21/11  5:00 AM      Component Value Range Comment   Heparin Unfractionated 0.54  0.30 - 0.70 (IU/mL)   PROTIME-INR     Status: Abnormal   Collection Time   08/21/11  5:00 AM      Component Value Range Comment   Prothrombin Time 27.0 (*) 11.6 - 15.2 (seconds)    INR 2.45 (*) 0.00 - 1.49    CBC     Status: Abnormal   Collection Time   08/21/11  5:00 AM      Component Value Range Comment   WBC 7.2  4.0 - 10.5 (K/uL)    RBC 3.52 (*) 3.87 - 5.11 (MIL/uL)    Hemoglobin 9.7 (*) 12.0 - 15.0 (g/dL)    HCT 09.6 (*) 04.5 - 46.0 (%)    MCV 87.8  78.0 - 100.0 (fL)    MCH 27.6  26.0 - 34.0 (pg)    MCHC 31.4  30.0 - 36.0 (g/dL)    RDW 40.9  81.1 - 91.4 (%)    Platelets 170  150 - 400 (K/uL)   HEPARIN LEVEL (UNFRACTIONATED)     Status: Normal   Collection Time   08/22/11  5:30 AM      Component Value Range Comment   Heparin Unfractionated 0.49  0.30 - 0.70 (IU/mL)   PROTIME-INR     Status: Abnormal   Collection Time   08/22/11   5:30 AM      Component Value Range Comment   Prothrombin Time 31.8 (*) 11.6 - 15.2 (seconds)    INR 3.02 (*) 0.00 - 1.49    CBC     Status: Abnormal   Collection Time   08/22/11  5:30 AM      Component Value Range Comment   WBC 6.7  4.0 - 10.5 (K/uL)    RBC 3.49 (*) 3.87 - 5.11 (MIL/uL)    Hemoglobin 9.7 (*) 12.0 - 15.0 (g/dL)    HCT 78.2 (*) 95.6 - 46.0 (%)    MCV 87.4  78.0 - 100.0 (fL)    MCH 27.8  26.0 - 34.0 (pg)    MCHC 31.8  30.0 - 36.0 (g/dL)    RDW 21.3  08.6 - 57.8 (%)    Platelets 193  150 - 400 (K/uL)      HPI :56 year old Caucasian female patient with history of atrial fibrillation, St Jude's mitral valve replacement for rheumatic heart disease, on chronic Coumadin therapy, hypertension, status post laparoscopic cholecystectomy, ERCP with stent placement and stone removal who presents to the emergency department with above complaints. Patient indicates that she was in her usual state of health until approximately 7 AM today. She was driving to work when she experienced sudden onset of 10/10 epigastric sharp pain with associated diaphoresis. She denied any nausea or vomiting. She denied any chest pain or dyspnea. She had to pull over to the side of the road. She took some kind of tablet for the pain with no significant relief. 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 She was brought to the emergency department and her pain was eventually relieved after 3-4 hours after pain medications. She had a BM this morning but cannot tell us the consistency no color. She denies fever or chills. She indicates that this episode seemed like a "gallbladder attack" is which was similar to her prior episode when she was admitted and found to be jaundiced and had to have ERCP and stent placement and eventually became septic and was admitted to the ICU for approximately a month. Patient did not have a fever or leukocytosis. Her CT scan of the abdomen was abnormal  with findings as below. The ED physician  consulted the surgeons who recommended medical admission and GI consultation. Gastroenterology has been consulted.   HOSPITAL COURSE: 1. Abdominal pain/ AScending cholangitis  She has had 2 previous ERCP and history of cholangitis and CBD stone who had a third attack she says treated medically and was in her normal state of health until recently when she had increased right upper quadrant pain and chills and sweats and presented to the emergency room with increased liver tests and although she has air in the bile duct they are more dilated than before as well as a questionable cholangitis on CT.  her liver tests are increased. She's been asymptomatic from a GI standpoint . Anticoagulation was held and she underwent ERCP With results as follows ENDOSCOPIC IMPRESSION: 1. Patent previous sphincterotomy site  2. No PD injections or wires advancement  3. 2 small stones removed  4. No signs of obstruction or cholangitis and seemingly adequate  biliary drainage   Her diet was advanced and her antibiotics were discontinued    2)Atrial fib -rate controlled continue medications (dig/metoprolol)- 3)HYPERTENSION, UNSPECIFIED- stable  4)GERD-PO protonix  5)MITRAL VALVE REPLACEMENT, HX OF- restarted coumadin and she was also started and  continued heparin gtt till her  INR >3. The patient states this is the cutoff INR recommended by Dr. Simonne Come her INR is > 3.0 6)Abdominal pain- s/p ERCP with removal of stones- Stopped abx after rx of ascending cholangitis and advanced diet. 7)Hyponatremia-mild resolved with iv hydration    Discharge Exam:  Blood pressure 131/92, pulse 74, temperature 98 F (36.7 C), temperature source Oral, resp. rate 20, height 5\' 7"  (1.702 m), weight 79.9 kg (176 lb 2.4 oz), SpO2 95.00%. General: awake, No acute distress.  HEENT: EOMI.  Neck: Supple  CV: irr  Lungs: Clear to ascultation bilaterally  Abdomen: Soft, min tenderness, +BS    Ext: Good pulses. No edema.     Discharge Orders    Future Appointments: Provider: Department: Dept Phone: Center:   08/25/2011 4:30 PM [facility] [phone] None     Future Orders Please Complete By Expires   Diet - low sodium heart healthy      Increase activity slowly      Discharge instructions      Comments:   Patient can go back to work next monday      Follow-up Information    Follow up with [name], MD in 1 week. (INR on  08/25/11)          Signed: Richarda Overlie 08/22/2011, 1:07 PM

## 2011-08-22 NOTE — Progress Notes (Signed)
Patient states she call [name][facility] s coumadin clinic to cancel other appt and she rescheduled her appt for her pt/inr ck for Monday 3/4  at 4:30

## 2011-08-22 NOTE — Progress Notes (Signed)
ANTICOAGULATION CONSULT NOTE - Follow Up Consult  Pharmacy Consult for Heparin and Coumadin Indication: St. Jude MVR and Afib  Assessment: 56 yo female on heparin to Coumadin bridge for a St. Jude MVR and Afib. Heparin level AND INR are therapeutic. No bleeding noted. CBC is stable.  Goal of Therapy:  INR 3-3.5 Heparin level 0.3-0.7   Plan:  1. Suggest D/C heparin gtt.  2. Coumadin 2.5 mg po tonight, (resume home dose 2.5mg  MF and 5mg  other days) 3. INR daily   No Known Allergies  Patient Measurements: Height: 5\' 7"  (170.2 cm) Weight: 176 lb 2.4 oz (79.9 kg) IBW/kg (Calculated) : 61.6  Heparin Dosing Weight: 78.6 kg  Vital Signs: Temp: 98 F (36.7 C) (03/01 0505) Temp src: Oral (03/01 0505) BP: 131/92 mmHg (03/01 0505) Pulse Rate: 74  (03/01 0505)  Labs:  Basename 08/22/11 0530 08/21/11 0500 08/20/11 0505  HGB 9.7* 9.7* --  HCT 30.5* 30.9* 31.6*  PLT 193 170 172  APTT -- -- --  LABPROT 31.8* 27.0* 24.3*  INR 3.02* 2.45* 2.14*  HEPARINUNFRC 0.49 0.54 0.30  CREATININE -- -- --  CKTOTAL -- -- --  CKMB -- -- --  TROPONINI -- -- --   Estimated Creatinine Clearance: 83.4 ml/min (by C-G formula based on Cr of 0.75).   Medications:  Scheduled:     . digoxin  250 mcg Oral Daily  . metoprolol succinate  25 mg Oral Daily  . pantoprazole  40 mg Oral Q1200  . rosuvastatin  20 mg Oral q1800  . sodium chloride  3 mL Intravenous Q12H  . warfarin  7.5 mg Oral ONCE-1800  . Warfarin - Pharmacist Dosing Inpatient   Does not apply q1800   Infusions:     . 0.9 % NaCl with KCl 20 mEq / L 100 mL/hr at 08/22/11 0625  . heparin 2,150 Units/hr (08/22/11 0429)    Dannielle Huh 08/22/2011,7:36 AM

## 2011-08-25 ENCOUNTER — Ambulatory Visit (INDEPENDENT_AMBULATORY_CARE_PROVIDER_SITE_OTHER): Payer: BC Managed Care – PPO | Admitting: *Deleted

## 2011-08-25 ENCOUNTER — Encounter: Payer: Self-pay | Admitting: *Deleted

## 2011-08-25 DIAGNOSIS — Z9889 Other specified postprocedural states: Secondary | ICD-10-CM

## 2011-08-25 DIAGNOSIS — I4891 Unspecified atrial fibrillation: Secondary | ICD-10-CM

## 2011-08-25 DIAGNOSIS — I059 Rheumatic mitral valve disease, unspecified: Secondary | ICD-10-CM

## 2011-09-04 ENCOUNTER — Encounter: Payer: Self-pay | Admitting: *Deleted

## 2011-09-04 ENCOUNTER — Ambulatory Visit (INDEPENDENT_AMBULATORY_CARE_PROVIDER_SITE_OTHER): Payer: BC Managed Care – PPO | Admitting: *Deleted

## 2011-09-04 DIAGNOSIS — I4891 Unspecified atrial fibrillation: Secondary | ICD-10-CM

## 2011-09-04 DIAGNOSIS — Z9889 Other specified postprocedural states: Secondary | ICD-10-CM

## 2011-09-04 DIAGNOSIS — I059 Rheumatic mitral valve disease, unspecified: Secondary | ICD-10-CM

## 2011-09-04 LAB — POCT INR: INR: 1.9

## 2011-09-22 ENCOUNTER — Ambulatory Visit (INDEPENDENT_AMBULATORY_CARE_PROVIDER_SITE_OTHER): Payer: BC Managed Care – PPO | Admitting: Pharmacist

## 2011-09-22 DIAGNOSIS — I4891 Unspecified atrial fibrillation: Secondary | ICD-10-CM

## 2011-09-22 DIAGNOSIS — I059 Rheumatic mitral valve disease, unspecified: Secondary | ICD-10-CM

## 2011-09-22 DIAGNOSIS — Z9889 Other specified postprocedural states: Secondary | ICD-10-CM

## 2011-09-22 LAB — POCT INR: INR: 2.5

## 2011-10-06 ENCOUNTER — Ambulatory Visit (INDEPENDENT_AMBULATORY_CARE_PROVIDER_SITE_OTHER): Payer: BC Managed Care – PPO | Admitting: *Deleted

## 2011-10-06 DIAGNOSIS — I059 Rheumatic mitral valve disease, unspecified: Secondary | ICD-10-CM

## 2011-10-06 DIAGNOSIS — Z9889 Other specified postprocedural states: Secondary | ICD-10-CM

## 2011-10-06 DIAGNOSIS — I4891 Unspecified atrial fibrillation: Secondary | ICD-10-CM

## 2011-10-06 LAB — POCT INR: INR: 2.2

## 2011-10-20 ENCOUNTER — Ambulatory Visit (INDEPENDENT_AMBULATORY_CARE_PROVIDER_SITE_OTHER): Payer: BC Managed Care – PPO | Admitting: *Deleted

## 2011-10-20 DIAGNOSIS — I059 Rheumatic mitral valve disease, unspecified: Secondary | ICD-10-CM

## 2011-10-20 DIAGNOSIS — Z9889 Other specified postprocedural states: Secondary | ICD-10-CM

## 2011-10-20 DIAGNOSIS — I4891 Unspecified atrial fibrillation: Secondary | ICD-10-CM

## 2011-11-04 ENCOUNTER — Other Ambulatory Visit: Payer: Self-pay | Admitting: Cardiology

## 2011-11-10 ENCOUNTER — Ambulatory Visit (INDEPENDENT_AMBULATORY_CARE_PROVIDER_SITE_OTHER): Payer: BC Managed Care – PPO | Admitting: *Deleted

## 2011-11-10 ENCOUNTER — Encounter: Payer: Self-pay | Admitting: Pharmacist

## 2011-11-10 DIAGNOSIS — I059 Rheumatic mitral valve disease, unspecified: Secondary | ICD-10-CM

## 2011-11-10 DIAGNOSIS — I4891 Unspecified atrial fibrillation: Secondary | ICD-10-CM

## 2011-11-10 DIAGNOSIS — Z9889 Other specified postprocedural states: Secondary | ICD-10-CM

## 2011-11-10 LAB — POCT INR: INR: 3.8

## 2011-11-24 ENCOUNTER — Ambulatory Visit (INDEPENDENT_AMBULATORY_CARE_PROVIDER_SITE_OTHER): Payer: BC Managed Care – PPO | Admitting: *Deleted

## 2011-11-24 ENCOUNTER — Encounter: Payer: Self-pay | Admitting: *Deleted

## 2011-11-24 DIAGNOSIS — I059 Rheumatic mitral valve disease, unspecified: Secondary | ICD-10-CM

## 2011-11-24 DIAGNOSIS — I4891 Unspecified atrial fibrillation: Secondary | ICD-10-CM

## 2011-11-24 DIAGNOSIS — Z9889 Other specified postprocedural states: Secondary | ICD-10-CM

## 2011-12-07 ENCOUNTER — Other Ambulatory Visit: Payer: Self-pay | Admitting: Cardiology

## 2011-12-15 ENCOUNTER — Ambulatory Visit (INDEPENDENT_AMBULATORY_CARE_PROVIDER_SITE_OTHER): Payer: BC Managed Care – PPO

## 2011-12-15 DIAGNOSIS — Z9889 Other specified postprocedural states: Secondary | ICD-10-CM

## 2011-12-15 DIAGNOSIS — I4891 Unspecified atrial fibrillation: Secondary | ICD-10-CM

## 2011-12-15 DIAGNOSIS — I059 Rheumatic mitral valve disease, unspecified: Secondary | ICD-10-CM

## 2011-12-18 ENCOUNTER — Telehealth: Payer: Self-pay | Admitting: *Deleted

## 2011-12-18 DIAGNOSIS — E7800 Pure hypercholesterolemia, unspecified: Secondary | ICD-10-CM

## 2011-12-18 NOTE — Telephone Encounter (Signed)
Called pt b/c she is due for fasting cholesterol labs. She has appt with Dr. Daleen Squibb on 12/24/11.  Fasting labs ordered. Mylo Red RN

## 2011-12-24 ENCOUNTER — Encounter: Payer: Self-pay | Admitting: *Deleted

## 2011-12-24 ENCOUNTER — Ambulatory Visit (INDEPENDENT_AMBULATORY_CARE_PROVIDER_SITE_OTHER): Payer: BC Managed Care – PPO | Admitting: Cardiology

## 2011-12-24 ENCOUNTER — Encounter: Payer: Self-pay | Admitting: Cardiology

## 2011-12-24 VITALS — BP 134/80 | HR 60 | Ht 67.0 in | Wt 172.0 lb

## 2011-12-24 DIAGNOSIS — I482 Chronic atrial fibrillation, unspecified: Secondary | ICD-10-CM

## 2011-12-24 DIAGNOSIS — I2789 Other specified pulmonary heart diseases: Secondary | ICD-10-CM

## 2011-12-24 DIAGNOSIS — I10 Essential (primary) hypertension: Secondary | ICD-10-CM

## 2011-12-24 DIAGNOSIS — I4891 Unspecified atrial fibrillation: Secondary | ICD-10-CM

## 2011-12-24 DIAGNOSIS — Z7901 Long term (current) use of anticoagulants: Secondary | ICD-10-CM

## 2011-12-24 DIAGNOSIS — Z9889 Other specified postprocedural states: Secondary | ICD-10-CM

## 2011-12-24 DIAGNOSIS — E7800 Pure hypercholesterolemia, unspecified: Secondary | ICD-10-CM

## 2011-12-24 DIAGNOSIS — Z5181 Encounter for therapeutic drug level monitoring: Secondary | ICD-10-CM

## 2011-12-24 LAB — LIPID PANEL
LDL Cholesterol: 91 mg/dL (ref 0–99)
Total CHOL/HDL Ratio: 4
Triglycerides: 102 mg/dL (ref 0.0–149.0)

## 2011-12-24 LAB — HEPATIC FUNCTION PANEL
Albumin: 3.8 g/dL (ref 3.5–5.2)
Alkaline Phosphatase: 98 U/L (ref 39–117)
Total Bilirubin: 1.1 mg/dL (ref 0.3–1.2)

## 2011-12-24 NOTE — Progress Notes (Signed)
HPI [name] returns today for evaluation and management of her history of rheumatic heart disease, history of [employer] mitral valve replacement in 1997, chronic A. fib, anticoagulation, mild pulmonary hypertension, hyperlipidemia, and hypertension.  She has no complaints. She denies any chest pain, palpitations, presyncope, edema. She's taking all of her medications including her statin. She is fasting for blood work today. She denies any melena.  Past Medical History  Diagnosis Date  . Rheumatic heart disease   . HTN (hypertension)     unspecified  . Pulmonary HTN     mild  . Hypercholesterolemia   . GERD (gastroesophageal reflux disease)   . Goiter     multinodular  . Atrial fibrillation     Current Outpatient Prescriptions  Medication Sig Dispense Refill  . atorvastatin (LIPITOR) 40 MG tablet TAKE ONE TABLET BY MOUTH EVERY DAY  30 tablet  4  . digoxin (LANOXIN) 0.25 MG tablet Take 1 tablet (250 mcg total) by mouth daily.  30 tablet  11  . metoprolol succinate (TOPROL-XL) 25 MG 24 hr tablet TAKE ONE TABLET BY MOUTH EVERY DAY  30 tablet  12  . omeprazole (PRILOSEC) 20 MG capsule Take 20 mg by mouth daily.        . ondansetron (ZOFRAN) 8 MG tablet Take by mouth every 12 (twelve) hours as needed. For nausea      . pantoprazole (PROTONIX) 40 MG tablet Take 1 tablet (40 mg total) by mouth daily at 12 noon.  30 tablet  0  . warfarin (COUMADIN) 5 MG tablet Take as directed by Anticoagulation clinic  35 tablet  3    No Known Allergies  Family History  Problem Relation Age of Onset  . Hypothyroidism Sister   . Gallbladder disease Sister   . Coronary artery disease    . Diabetes Mother   . Hypertension Mother   . Heart disease Father   . Stroke Father     History   Social History  . Marital Status: Divorced    Spouse Name: N/A    Number of Children: N/A  . Years of Education: N/A   Occupational History  . Geologist, engineering    Social History Main Topics  . Smoking  status: Never Smoker   . Smokeless tobacco: Not on file  . Alcohol Use: No  . Drug Use: No  . Sexually Active: No   Other Topics Concern  . Not on file   Social History Narrative  . No narrative on file    ROS ALL NEGATIVE EXCEPT THOSE NOTED IN HPI  PE  General Appearance: well developed, well nourished in no acute distress, overweight HEENT: symmetrical face, PERRLA, good dentition  Neck: no JVD, thyromegaly, or adenopathy, trachea midline Chest: symmetric without deformity Cardiac: PMI non-displaced, irregular rate and rhythm, prosthetic S1 S2, no gallop or murmur Lung: clear to ausculation and percussion Vascular: all pulses full without bruits  Abdominal: nondistended, nontender, good bowel sounds, no HSM, no bruits Extremities: no cyanosis, clubbing or edema, no sign of DVT, no varicosities  Skin: normal color, no rashes Neuro: alert and oriented x 3, non-focal Pysch: normal affect  EKG Chronic A. fib with a well-controlled ventricular rate, nonspecific ST segment changes, no acute change. BMET    Component Value Date/Time   NA 139 08/18/2011 0545   K 4.5 08/18/2011 0545   CL 109 08/18/2011 0545   CO2 24 08/18/2011 0545   GLUCOSE 100* 08/18/2011 0545   BUN 4* 08/18/2011 0545  CREATININE 0.75 08/18/2011 0545   CALCIUM 9.1 08/18/2011 0545   GFRNONAA >90 08/18/2011 0545   GFRAA >90 08/18/2011 0545    Lipid Panel     Component Value Date/Time   CHOL 151 12/16/2010 0836   TRIG 123.0 12/16/2010 0836   HDL 32.40* 12/16/2010 0836   CHOLHDL 5 12/16/2010 0836   VLDL 24.6 12/16/2010 0836   LDLCALC 94 12/16/2010 0836    CBC    Component Value Date/Time   WBC 6.7 08/22/2011 0530   RBC 3.49* 08/22/2011 0530   HGB 9.7* 08/22/2011 0530   HCT 30.5* 08/22/2011 0530   PLT 193 08/22/2011 0530   MCV 87.4 08/22/2011 0530   MCH 27.8 08/22/2011 0530   MCHC 31.8 08/22/2011 0530   RDW 15.3 08/22/2011 0530   LYMPHSABS 0.7 08/14/2011 0954   MONOABS 0.4 08/14/2011 0954   EOSABS 0.0 08/14/2011 0954    BASOSABS 0.0 08/14/2011 0954

## 2011-12-24 NOTE — Assessment & Plan Note (Signed)
Stable with good rate control. Continue current meds and anticoagulation. See [place] back in the office in 6 months.

## 2011-12-24 NOTE — Assessment & Plan Note (Signed)
Valve is stable by exam and clinical history. No indication for echocardiography.

## 2011-12-24 NOTE — Patient Instructions (Addendum)
Your physician recommends that you have lab work today  Your physician recommends that you continue on your current medications as directed. Please refer to the Current Medication list given to you today.  Your physician wants you to follow-up in: 6 months. You will receive a reminder letter in the mail two months in advance. If you don't receive a letter, please call our office to schedule the follow-up appointment.

## 2011-12-24 NOTE — Assessment & Plan Note (Signed)
Checking labs today. Continue statin every day reinforced.

## 2011-12-24 NOTE — Addendum Note (Signed)
Addended by: Tonita Phoenix on: 12/24/2011 10:53 AM   Modules accepted: Orders

## 2011-12-24 NOTE — Assessment & Plan Note (Signed)
Under good control 

## 2012-01-05 ENCOUNTER — Ambulatory Visit (INDEPENDENT_AMBULATORY_CARE_PROVIDER_SITE_OTHER): Payer: BC Managed Care – PPO | Admitting: *Deleted

## 2012-01-05 DIAGNOSIS — I482 Chronic atrial fibrillation, unspecified: Secondary | ICD-10-CM

## 2012-01-05 DIAGNOSIS — Z9889 Other specified postprocedural states: Secondary | ICD-10-CM

## 2012-01-05 DIAGNOSIS — I4891 Unspecified atrial fibrillation: Secondary | ICD-10-CM

## 2012-01-05 DIAGNOSIS — I059 Rheumatic mitral valve disease, unspecified: Secondary | ICD-10-CM

## 2012-01-05 LAB — POCT INR: INR: 4.6

## 2012-01-19 ENCOUNTER — Ambulatory Visit (INDEPENDENT_AMBULATORY_CARE_PROVIDER_SITE_OTHER): Payer: BC Managed Care – PPO | Admitting: *Deleted

## 2012-01-19 DIAGNOSIS — I482 Chronic atrial fibrillation, unspecified: Secondary | ICD-10-CM

## 2012-01-19 DIAGNOSIS — I4891 Unspecified atrial fibrillation: Secondary | ICD-10-CM

## 2012-01-19 DIAGNOSIS — Z9889 Other specified postprocedural states: Secondary | ICD-10-CM

## 2012-01-19 DIAGNOSIS — I059 Rheumatic mitral valve disease, unspecified: Secondary | ICD-10-CM

## 2012-02-04 ENCOUNTER — Other Ambulatory Visit: Payer: Self-pay | Admitting: Cardiology

## 2012-02-04 MED ORDER — WARFARIN SODIUM 5 MG PO TABS: ORAL_TABLET | ORAL | Status: DC

## 2012-02-09 ENCOUNTER — Ambulatory Visit (INDEPENDENT_AMBULATORY_CARE_PROVIDER_SITE_OTHER): Payer: BC Managed Care – PPO | Admitting: *Deleted

## 2012-02-09 DIAGNOSIS — I482 Chronic atrial fibrillation, unspecified: Secondary | ICD-10-CM

## 2012-02-09 DIAGNOSIS — I059 Rheumatic mitral valve disease, unspecified: Secondary | ICD-10-CM

## 2012-02-09 DIAGNOSIS — I4891 Unspecified atrial fibrillation: Secondary | ICD-10-CM

## 2012-02-09 DIAGNOSIS — Z9889 Other specified postprocedural states: Secondary | ICD-10-CM

## 2012-02-09 LAB — POCT INR: INR: 3.8

## 2012-03-02 ENCOUNTER — Ambulatory Visit (INDEPENDENT_AMBULATORY_CARE_PROVIDER_SITE_OTHER): Payer: BC Managed Care – PPO | Admitting: *Deleted

## 2012-03-02 ENCOUNTER — Encounter: Payer: Self-pay | Admitting: *Deleted

## 2012-03-02 DIAGNOSIS — I059 Rheumatic mitral valve disease, unspecified: Secondary | ICD-10-CM

## 2012-03-02 DIAGNOSIS — I482 Chronic atrial fibrillation, unspecified: Secondary | ICD-10-CM

## 2012-03-02 DIAGNOSIS — I4891 Unspecified atrial fibrillation: Secondary | ICD-10-CM

## 2012-03-02 DIAGNOSIS — Z9889 Other specified postprocedural states: Secondary | ICD-10-CM

## 2012-03-02 LAB — POCT INR: INR: 2.3

## 2012-03-23 ENCOUNTER — Ambulatory Visit (INDEPENDENT_AMBULATORY_CARE_PROVIDER_SITE_OTHER): Payer: BC Managed Care – PPO

## 2012-03-23 DIAGNOSIS — I059 Rheumatic mitral valve disease, unspecified: Secondary | ICD-10-CM

## 2012-03-23 DIAGNOSIS — I482 Chronic atrial fibrillation, unspecified: Secondary | ICD-10-CM

## 2012-03-23 DIAGNOSIS — Z9889 Other specified postprocedural states: Secondary | ICD-10-CM

## 2012-03-23 DIAGNOSIS — I4891 Unspecified atrial fibrillation: Secondary | ICD-10-CM

## 2012-04-06 ENCOUNTER — Other Ambulatory Visit: Payer: Self-pay | Admitting: Cardiology

## 2012-04-13 ENCOUNTER — Ambulatory Visit (INDEPENDENT_AMBULATORY_CARE_PROVIDER_SITE_OTHER): Payer: BC Managed Care – PPO | Admitting: *Deleted

## 2012-04-13 ENCOUNTER — Encounter: Payer: Self-pay | Admitting: *Deleted

## 2012-04-13 DIAGNOSIS — I059 Rheumatic mitral valve disease, unspecified: Secondary | ICD-10-CM

## 2012-04-13 DIAGNOSIS — I482 Chronic atrial fibrillation, unspecified: Secondary | ICD-10-CM

## 2012-04-13 DIAGNOSIS — Z9889 Other specified postprocedural states: Secondary | ICD-10-CM

## 2012-04-13 DIAGNOSIS — I4891 Unspecified atrial fibrillation: Secondary | ICD-10-CM

## 2012-05-04 ENCOUNTER — Encounter: Payer: Self-pay | Admitting: *Deleted

## 2012-05-04 ENCOUNTER — Ambulatory Visit (INDEPENDENT_AMBULATORY_CARE_PROVIDER_SITE_OTHER): Payer: BC Managed Care – PPO | Admitting: *Deleted

## 2012-05-04 DIAGNOSIS — I482 Chronic atrial fibrillation, unspecified: Secondary | ICD-10-CM

## 2012-05-04 DIAGNOSIS — I059 Rheumatic mitral valve disease, unspecified: Secondary | ICD-10-CM

## 2012-05-04 DIAGNOSIS — I4891 Unspecified atrial fibrillation: Secondary | ICD-10-CM

## 2012-05-04 DIAGNOSIS — Z9889 Other specified postprocedural states: Secondary | ICD-10-CM

## 2012-05-04 LAB — POCT INR: INR: 3

## 2012-06-01 ENCOUNTER — Ambulatory Visit (INDEPENDENT_AMBULATORY_CARE_PROVIDER_SITE_OTHER): Payer: BC Managed Care – PPO | Admitting: *Deleted

## 2012-06-01 ENCOUNTER — Encounter: Payer: Self-pay | Admitting: *Deleted

## 2012-06-01 DIAGNOSIS — I4891 Unspecified atrial fibrillation: Secondary | ICD-10-CM

## 2012-06-01 DIAGNOSIS — I059 Rheumatic mitral valve disease, unspecified: Secondary | ICD-10-CM

## 2012-06-01 DIAGNOSIS — Z9889 Other specified postprocedural states: Secondary | ICD-10-CM

## 2012-06-01 DIAGNOSIS — I482 Chronic atrial fibrillation, unspecified: Secondary | ICD-10-CM

## 2012-06-04 ENCOUNTER — Other Ambulatory Visit: Payer: Self-pay | Admitting: *Deleted

## 2012-06-04 MED ORDER — WARFARIN SODIUM 5 MG PO TABS: ORAL_TABLET | ORAL | Status: DC

## 2012-06-09 ENCOUNTER — Telehealth: Payer: Self-pay | Admitting: Cardiology

## 2012-06-09 MED ORDER — DIGOXIN 250 MCG PO TABS: 250.0000 ug | ORAL_TABLET | Freq: Every day | ORAL | Status: DC

## 2012-06-09 NOTE — Telephone Encounter (Signed)
Pt needs digoxin called in she has only 2 pills left call into [facility] on [address] in [place]

## 2012-06-09 NOTE — Telephone Encounter (Signed)
Left msg at home to notify her that as requested her digoxin was sent to [facility] [address] in [place]

## 2012-06-22 ENCOUNTER — Ambulatory Visit (INDEPENDENT_AMBULATORY_CARE_PROVIDER_SITE_OTHER): Payer: BC Managed Care – PPO | Admitting: *Deleted

## 2012-06-22 DIAGNOSIS — I059 Rheumatic mitral valve disease, unspecified: Secondary | ICD-10-CM

## 2012-06-22 DIAGNOSIS — Z9889 Other specified postprocedural states: Secondary | ICD-10-CM

## 2012-06-22 DIAGNOSIS — I4891 Unspecified atrial fibrillation: Secondary | ICD-10-CM

## 2012-06-22 DIAGNOSIS — I482 Chronic atrial fibrillation, unspecified: Secondary | ICD-10-CM

## 2012-06-30 ENCOUNTER — Encounter: Payer: Self-pay | Admitting: Cardiology

## 2012-06-30 ENCOUNTER — Encounter: Payer: Self-pay | Admitting: *Deleted

## 2012-06-30 ENCOUNTER — Other Ambulatory Visit: Payer: Self-pay | Admitting: *Deleted

## 2012-06-30 ENCOUNTER — Ambulatory Visit (INDEPENDENT_AMBULATORY_CARE_PROVIDER_SITE_OTHER): Payer: BC Managed Care – PPO | Admitting: Cardiology

## 2012-06-30 VITALS — BP 132/84 | HR 79 | Ht 67.0 in | Wt 180.0 lb

## 2012-06-30 DIAGNOSIS — I10 Essential (primary) hypertension: Secondary | ICD-10-CM

## 2012-06-30 DIAGNOSIS — I059 Rheumatic mitral valve disease, unspecified: Secondary | ICD-10-CM

## 2012-06-30 DIAGNOSIS — I482 Chronic atrial fibrillation, unspecified: Secondary | ICD-10-CM

## 2012-06-30 DIAGNOSIS — Z7901 Long term (current) use of anticoagulants: Secondary | ICD-10-CM

## 2012-06-30 DIAGNOSIS — Z8679 Personal history of other diseases of the circulatory system: Secondary | ICD-10-CM

## 2012-06-30 DIAGNOSIS — Z9889 Other specified postprocedural states: Secondary | ICD-10-CM

## 2012-06-30 DIAGNOSIS — I2789 Other specified pulmonary heart diseases: Secondary | ICD-10-CM

## 2012-06-30 DIAGNOSIS — E7800 Pure hypercholesterolemia, unspecified: Secondary | ICD-10-CM

## 2012-06-30 DIAGNOSIS — I4891 Unspecified atrial fibrillation: Secondary | ICD-10-CM

## 2012-06-30 DIAGNOSIS — Z5181 Encounter for therapeutic drug level monitoring: Secondary | ICD-10-CM

## 2012-06-30 MED ORDER — ATORVASTATIN CALCIUM 40 MG PO TABS: 40.0000 mg | ORAL_TABLET | Freq: Every day | ORAL | Status: DC

## 2012-06-30 NOTE — Assessment & Plan Note (Signed)
Stable. No indication for echocardiography.

## 2012-06-30 NOTE — Patient Instructions (Addendum)
Fasting Labs should be drawn in April 2014:  Lipids/Liver/Digoxin/CMP  Your physician wants you to follow-up in: 1 year with Dr. Daleen Squibb.  You will receive a reminder letter in the mail two months in advance. If you don't receive a letter, please call our office to schedule the follow-up appointment.

## 2012-06-30 NOTE — Assessment & Plan Note (Signed)
Well-controlled rate with anticoagulation. No change in current recommendations.

## 2012-06-30 NOTE — Progress Notes (Signed)
HPI [name] returns today for evaluation and management history rheumatic heart disease, history of prosthetic mitral valve replacement in the mid 90s, chronic A. fib, anticoagulation, hypertension, hyperlipidemia, and mild pulmonary hypertension.  She continued to remarkably well. She has maintained a steady weight and is walking on a regular basis. She denies any symptoms of angina, ischemia, presyncope or syncope, palpitations, and has had no issues with anticoagulation. He is very compliant.  Past Medical History  Diagnosis Date  . Rheumatic heart disease   . HTN (hypertension)     unspecified  . Pulmonary HTN     mild  . Hypercholesterolemia   . GERD (gastroesophageal reflux disease)   . Goiter     multinodular  . Atrial fibrillation     Current Outpatient Prescriptions  Medication Sig Dispense Refill  . atorvastatin (LIPITOR) 40 MG tablet Take 1 tablet (40 mg total) by mouth daily.  90 tablet  3  . digoxin (LANOXIN) 0.25 MG tablet Take 1 tablet (250 mcg total) by mouth daily.  30 tablet  11  . metoprolol succinate (TOPROL-XL) 25 MG 24 hr tablet TAKE ONE TABLET BY MOUTH EVERY DAY  30 tablet  12  . omeprazole (PRILOSEC) 20 MG capsule Take 20 mg by mouth daily.        . pantoprazole (PROTONIX) 40 MG tablet Take 1 tablet (40 mg total) by mouth daily at 12 noon.  30 tablet  0  . warfarin (COUMADIN) 5 MG tablet Take as directed by Anticoagulation clinic  35 tablet  3  . ondansetron (ZOFRAN) 8 MG tablet Take by mouth every 12 (twelve) hours as needed. For nausea        No Known Allergies  Family History  Problem Relation Age of Onset  . Hypothyroidism Sister   . Gallbladder disease Sister   . Coronary artery disease    . Diabetes Mother   . Hypertension Mother   . Heart disease Father   . Stroke Father     History   Social History  . Marital Status: Divorced    Spouse Name: N/A    Number of Children: N/A  . Years of Education: N/A   Occupational History  . Conservator, museum/gallery    Social History Main Topics  . Smoking status: Never Smoker   . Smokeless tobacco: Not on file  . Alcohol Use: No  . Drug Use: No  . Sexually Active: No   Other Topics Concern  . Not on file   Social History Narrative  . No narrative on file    ROS ALL NEGATIVE EXCEPT THOSE NOTED IN HPI  PE  General Appearance: well developed, well nourished in no acute distress, overweight HEENT: symmetrical face, PERRLA, good dentition  Neck: no JVD, thyromegaly, or adenopathy, trachea midline Chest: symmetric without deformity Cardiac: PMI non-displaced, irregular rate and rhythm, prosthetic S1, S2, no gallop or murmur Lung: clear to ausculation and percussion Vascular: all pulses full without bruits  Abdominal: nondistended, nontender, good bowel sounds, no HSM, no bruits Extremities: no cyanosis, clubbing or edema, no sign of DVT, no varicosities  Skin: normal color, no rashes Neuro: alert and oriented x 3, non-focal Pysch: normal affect  EKG Chronic A. fib with ST segment changes inferior and anterior laterally, no acute changes. BMET    Component Value Date/Time   NA 139 08/18/2011 0545   K 4.5 08/18/2011 0545   CL 109 08/18/2011 0545   CO2 24 08/18/2011 0545   GLUCOSE 100* 08/18/2011  0545   BUN 4* 08/18/2011 0545   CREATININE 0.75 08/18/2011 0545   CALCIUM 9.1 08/18/2011 0545   GFRNONAA >90 08/18/2011 0545   GFRAA >90 08/18/2011 0545    Lipid Panel     Component Value Date/Time   CHOL 154 12/24/2011 1054   TRIG 102.0 12/24/2011 1054   HDL 42.60 12/24/2011 1054   CHOLHDL 4 12/24/2011 1054   VLDL 20.4 12/24/2011 1054   LDLCALC 91 12/24/2011 1054    CBC    Component Value Date/Time   WBC 6.7 08/22/2011 0530   RBC 3.49* 08/22/2011 0530   HGB 9.7* 08/22/2011 0530   HCT 30.5* 08/22/2011 0530   PLT 193 08/22/2011 0530   MCV 87.4 08/22/2011 0530   MCH 27.8 08/22/2011 0530   MCHC 31.8 08/22/2011 0530   RDW 15.3 08/22/2011 0530   LYMPHSABS 0.7 08/14/2011 0954   MONOABS 0.4 08/14/2011 0954    EOSABS 0.0 08/14/2011 0954   BASOSABS 0.0 08/14/2011 0954

## 2012-06-30 NOTE — Assessment & Plan Note (Signed)
Under good control. Continue current meds, walking, and weight control.

## 2012-06-30 NOTE — Assessment & Plan Note (Signed)
Obtain blood work in the spring. We'll also included that time a digoxin level.

## 2012-07-12 ENCOUNTER — Ambulatory Visit (INDEPENDENT_AMBULATORY_CARE_PROVIDER_SITE_OTHER): Payer: BC Managed Care – PPO | Admitting: *Deleted

## 2012-07-12 DIAGNOSIS — I482 Chronic atrial fibrillation, unspecified: Secondary | ICD-10-CM

## 2012-07-12 DIAGNOSIS — Z9889 Other specified postprocedural states: Secondary | ICD-10-CM

## 2012-07-12 DIAGNOSIS — I4891 Unspecified atrial fibrillation: Secondary | ICD-10-CM

## 2012-07-12 DIAGNOSIS — I059 Rheumatic mitral valve disease, unspecified: Secondary | ICD-10-CM

## 2012-08-02 ENCOUNTER — Ambulatory Visit (INDEPENDENT_AMBULATORY_CARE_PROVIDER_SITE_OTHER): Payer: BC Managed Care – PPO | Admitting: *Deleted

## 2012-08-02 ENCOUNTER — Encounter: Payer: Self-pay | Admitting: *Deleted

## 2012-08-02 DIAGNOSIS — I4891 Unspecified atrial fibrillation: Secondary | ICD-10-CM

## 2012-08-02 DIAGNOSIS — I059 Rheumatic mitral valve disease, unspecified: Secondary | ICD-10-CM

## 2012-08-02 DIAGNOSIS — Z9889 Other specified postprocedural states: Secondary | ICD-10-CM

## 2012-08-02 DIAGNOSIS — I482 Chronic atrial fibrillation, unspecified: Secondary | ICD-10-CM

## 2012-08-02 LAB — POCT INR: INR: 3.4

## 2012-08-30 ENCOUNTER — Ambulatory Visit (INDEPENDENT_AMBULATORY_CARE_PROVIDER_SITE_OTHER): Payer: BC Managed Care – PPO | Admitting: *Deleted

## 2012-08-30 DIAGNOSIS — Z9889 Other specified postprocedural states: Secondary | ICD-10-CM

## 2012-08-30 DIAGNOSIS — I059 Rheumatic mitral valve disease, unspecified: Secondary | ICD-10-CM

## 2012-08-30 DIAGNOSIS — I4891 Unspecified atrial fibrillation: Secondary | ICD-10-CM

## 2012-08-30 DIAGNOSIS — I482 Chronic atrial fibrillation, unspecified: Secondary | ICD-10-CM

## 2012-09-27 ENCOUNTER — Ambulatory Visit (INDEPENDENT_AMBULATORY_CARE_PROVIDER_SITE_OTHER): Payer: BC Managed Care – PPO

## 2012-09-27 DIAGNOSIS — I4891 Unspecified atrial fibrillation: Secondary | ICD-10-CM

## 2012-09-27 DIAGNOSIS — I059 Rheumatic mitral valve disease, unspecified: Secondary | ICD-10-CM

## 2012-09-27 DIAGNOSIS — Z9889 Other specified postprocedural states: Secondary | ICD-10-CM

## 2012-09-27 DIAGNOSIS — I482 Chronic atrial fibrillation, unspecified: Secondary | ICD-10-CM

## 2012-10-04 ENCOUNTER — Other Ambulatory Visit: Payer: Self-pay | Admitting: Cardiology

## 2012-10-06 ENCOUNTER — Ambulatory Visit (INDEPENDENT_AMBULATORY_CARE_PROVIDER_SITE_OTHER): Payer: BC Managed Care – PPO | Admitting: *Deleted

## 2012-10-06 DIAGNOSIS — I10 Essential (primary) hypertension: Secondary | ICD-10-CM

## 2012-10-06 DIAGNOSIS — I2789 Other specified pulmonary heart diseases: Secondary | ICD-10-CM

## 2012-10-06 DIAGNOSIS — E7800 Pure hypercholesterolemia, unspecified: Secondary | ICD-10-CM

## 2012-10-06 LAB — LIPID PANEL: Total CHOL/HDL Ratio: 5

## 2012-10-06 LAB — BASIC METABOLIC PANEL
BUN: 9 mg/dL (ref 6–23)
Calcium: 8.9 mg/dL (ref 8.4–10.5)
Creatinine, Ser: 0.8 mg/dL (ref 0.4–1.2)
GFR: 75.8 mL/min (ref >60.00)
Glucose, Bld: 122 mg/dL — ABNORMAL HIGH (ref 70–99)
Sodium: 136 mEq/L (ref 135–145)

## 2012-10-06 LAB — HEPATIC FUNCTION PANEL
ALT: 29 U/L (ref 0–35)
AST: 36 U/L (ref 0–37)
Alkaline Phosphatase: 83 U/L (ref 39–117)
Bilirubin, Direct: 0.1 mg/dL (ref 0.0–0.3)
Total Bilirubin: 1 mg/dL (ref 0.3–1.2)

## 2012-10-14 ENCOUNTER — Encounter: Payer: Self-pay | Admitting: *Deleted

## 2012-10-25 ENCOUNTER — Ambulatory Visit (INDEPENDENT_AMBULATORY_CARE_PROVIDER_SITE_OTHER): Payer: BC Managed Care – PPO

## 2012-10-25 DIAGNOSIS — I4891 Unspecified atrial fibrillation: Secondary | ICD-10-CM

## 2012-10-25 DIAGNOSIS — Z9889 Other specified postprocedural states: Secondary | ICD-10-CM

## 2012-10-25 DIAGNOSIS — I059 Rheumatic mitral valve disease, unspecified: Secondary | ICD-10-CM

## 2012-10-25 DIAGNOSIS — I482 Chronic atrial fibrillation, unspecified: Secondary | ICD-10-CM

## 2012-11-04 ENCOUNTER — Observation Stay (HOSPITAL_COMMUNITY)
Admission: EM | Admit: 2012-11-04 | Discharge: 2012-11-05 | Disposition: A | Discharge status: Home / Self Care | Payer: BC Managed Care – PPO | Attending: Orthopedic Surgery | Admitting: Orthopedic Surgery

## 2012-11-04 DIAGNOSIS — W108XXA Fall (on) (from) other stairs and steps, initial encounter: Secondary | ICD-10-CM | POA: Insufficient documentation

## 2012-11-04 DIAGNOSIS — S8262XA Displaced fracture of lateral malleolus of left fibula, initial encounter for closed fracture: Secondary | ICD-10-CM

## 2012-11-04 DIAGNOSIS — S8263XA Displaced fracture of lateral malleolus of unspecified fibula, initial encounter for closed fracture: Secondary | ICD-10-CM | POA: Insufficient documentation

## 2012-11-04 DIAGNOSIS — I099 Rheumatic heart disease, unspecified: Secondary | ICD-10-CM | POA: Insufficient documentation

## 2012-11-04 DIAGNOSIS — I4891 Unspecified atrial fibrillation: Secondary | ICD-10-CM | POA: Insufficient documentation

## 2012-11-04 DIAGNOSIS — S82841B Displaced bimalleolar fracture of right lower leg, initial encounter for open fracture type I or II: Secondary | ICD-10-CM

## 2012-11-04 DIAGNOSIS — I10 Essential (primary) hypertension: Secondary | ICD-10-CM | POA: Insufficient documentation

## 2012-11-04 DIAGNOSIS — S82853B Displaced trimalleolar fracture of unspecified lower leg, initial encounter for open fracture type I or II: Principal | ICD-10-CM | POA: Insufficient documentation

## 2012-11-04 DIAGNOSIS — W19XXXA Unspecified fall, initial encounter: Secondary | ICD-10-CM

## 2012-11-04 DIAGNOSIS — E7800 Pure hypercholesterolemia, unspecified: Secondary | ICD-10-CM | POA: Insufficient documentation

## 2012-11-04 DIAGNOSIS — E042 Nontoxic multinodular goiter: Secondary | ICD-10-CM | POA: Insufficient documentation

## 2012-11-04 DIAGNOSIS — I2789 Other specified pulmonary heart diseases: Secondary | ICD-10-CM | POA: Insufficient documentation

## 2012-11-04 DIAGNOSIS — S82851B Displaced trimalleolar fracture of right lower leg, initial encounter for open fracture type I or II: Secondary | ICD-10-CM

## 2012-11-04 DIAGNOSIS — I509 Heart failure, unspecified: Secondary | ICD-10-CM | POA: Insufficient documentation

## 2012-11-04 DIAGNOSIS — K219 Gastro-esophageal reflux disease without esophagitis: Secondary | ICD-10-CM | POA: Insufficient documentation

## 2012-11-04 DIAGNOSIS — Y92009 Unspecified place in unspecified non-institutional (private) residence as the place of occurrence of the external cause: Secondary | ICD-10-CM | POA: Insufficient documentation

## 2012-11-04 DIAGNOSIS — Z7901 Long term (current) use of anticoagulants: Secondary | ICD-10-CM | POA: Insufficient documentation

## 2012-11-04 DIAGNOSIS — M25579 Pain in unspecified ankle and joints of unspecified foot: Secondary | ICD-10-CM | POA: Insufficient documentation

## 2012-11-04 MED ORDER — ONDANSETRON HCL 4 MG/2ML IJ SOLN
4.0000 mg | Freq: Once | INTRAMUSCULAR | Status: AC
  Administered 2012-11-04: 4 mg via INTRAVENOUS
  Filled 2012-11-04: qty 2

## 2012-11-04 MED ORDER — HYDROMORPHONE HCL PF 1 MG/ML IJ SOLN
1.0000 mg | Freq: Once | INTRAMUSCULAR | Status: AC
  Administered 2012-11-04: 1 mg via INTRAVENOUS
  Filled 2012-11-04: qty 1

## 2012-11-04 MED ORDER — FENTANYL CITRATE 0.05 MG/ML IJ SOLN: 100.0000 ug | Freq: Once | INTRAMUSCULAR | Status: AC

## 2012-11-04 NOTE — ED Notes (Signed)
The patient slipped on a wet surface and twisted both ankles causing her to fall down two steps and land on her buttocks on a wooden surface.  She denies decreased loc prior to or after the fall, and she denies pain anywhere other than her ankles.

## 2012-11-05 ENCOUNTER — Emergency Department (HOSPITAL_COMMUNITY): Payer: BC Managed Care – PPO

## 2012-11-05 ENCOUNTER — Encounter (HOSPITAL_COMMUNITY)
Admission: EM | Disposition: A | Discharge status: Home / Self Care | Payer: Self-pay | Source: Home / Self Care | Attending: Emergency Medicine

## 2012-11-05 ENCOUNTER — Ambulatory Visit (HOSPITAL_COMMUNITY): Payer: BC Managed Care – PPO

## 2012-11-05 ENCOUNTER — Encounter (HOSPITAL_COMMUNITY): Payer: Self-pay | Admitting: Anesthesiology

## 2012-11-05 ENCOUNTER — Emergency Department (HOSPITAL_COMMUNITY): Payer: BC Managed Care – PPO | Admitting: Anesthesiology

## 2012-11-05 DIAGNOSIS — S8262XA Displaced fracture of lateral malleolus of left fibula, initial encounter for closed fracture: Secondary | ICD-10-CM

## 2012-11-05 DIAGNOSIS — S82841B Displaced bimalleolar fracture of right lower leg, initial encounter for open fracture type I or II: Secondary | ICD-10-CM

## 2012-11-05 HISTORY — PX: ORIF ANKLE FRACTURE: SHX5408

## 2012-11-05 LAB — CBC WITH DIFFERENTIAL/PLATELET
Lymphocytes Relative: 8 % — ABNORMAL LOW (ref 12–46)
Lymphs Abs: 1.1 10*3/uL (ref 0.7–4.0)
MCV: 84.6 fL (ref 78.0–100.0)
Neutrophils Relative %: 89 % — ABNORMAL HIGH (ref 43–77)
Platelets: 171 10*3/uL (ref 150–400)
RBC: 4.1 MIL/uL (ref 3.87–5.11)
WBC: 14.3 10*3/uL — ABNORMAL HIGH (ref 4.0–10.5)

## 2012-11-05 LAB — BASIC METABOLIC PANEL
CO2: 26 mEq/L (ref 19–32)
Chloride: 103 mEq/L (ref 96–112)
Glucose, Bld: 152 mg/dL — ABNORMAL HIGH (ref 70–99)
Potassium: 3.7 mEq/L (ref 3.5–5.1)
Sodium: 137 mEq/L (ref 135–145)

## 2012-11-05 LAB — PROTIME-INR: INR: 2.56 — ABNORMAL HIGH (ref 0.00–1.49)

## 2012-11-05 SURGERY — OPEN REDUCTION INTERNAL FIXATION (ORIF) ANKLE FRACTURE
Anesthesia: General | Site: Ankle | Laterality: Right | Wound class: Clean Contaminated

## 2012-11-05 MED ORDER — ONDANSETRON HCL 4 MG/2ML IJ SOLN
INTRAMUSCULAR | Status: DC | PRN
  Administered 2012-11-05: 4 mg via INTRAVENOUS

## 2012-11-05 MED ORDER — METHOCARBAMOL 100 MG/ML IJ SOLN: 500.0000 mg | Freq: Four times a day (QID) | INTRAVENOUS | Status: DC | PRN

## 2012-11-05 MED ORDER — ONDANSETRON HCL 4 MG PO TABS: 4.0000 mg | ORAL_TABLET | Freq: Four times a day (QID) | ORAL | Status: DC | PRN

## 2012-11-05 MED ORDER — WARFARIN SODIUM 5 MG PO TABS
5.0000 mg | ORAL_TABLET | Freq: Every day | ORAL | Status: DC
  Filled 2012-11-05: qty 1

## 2012-11-05 MED ORDER — PANTOPRAZOLE SODIUM 40 MG PO TBEC: 40.0000 mg | DELAYED_RELEASE_TABLET | Freq: Every day | ORAL | Status: DC

## 2012-11-05 MED ORDER — BUPIVACAINE HCL (PF) 0.5 % IJ SOLN
INTRAMUSCULAR | Status: AC
  Filled 2012-11-05: qty 30

## 2012-11-05 MED ORDER — FENTANYL CITRATE 0.05 MG/ML IJ SOLN
INTRAMUSCULAR | Status: DC | PRN
  Administered 2012-11-05: 100 ug via INTRAVENOUS
  Administered 2012-11-05: 50 ug via INTRAVENOUS

## 2012-11-05 MED ORDER — BUPIVACAINE HCL (PF) 0.25 % IJ SOLN
INTRAMUSCULAR | Status: DC | PRN
  Administered 2012-11-05: 10 mL

## 2012-11-05 MED ORDER — ACETAMINOPHEN 10 MG/ML IV SOLN
INTRAVENOUS | Status: AC
  Filled 2012-11-05: qty 100

## 2012-11-05 MED ORDER — WARFARIN - PHYSICIAN DOSING INPATIENT: Freq: Every day | Status: DC

## 2012-11-05 MED ORDER — DIGOXIN 250 MCG PO TABS
250.0000 ug | ORAL_TABLET | Freq: Every day | ORAL | Status: DC
  Administered 2012-11-05: 250 ug via ORAL
  Filled 2012-11-05: qty 1

## 2012-11-05 MED ORDER — METHOCARBAMOL 500 MG PO TABS
500.0000 mg | ORAL_TABLET | Freq: Four times a day (QID) | ORAL | Status: DC | PRN
  Administered 2012-11-05: 500 mg via ORAL
  Filled 2012-11-05: qty 1

## 2012-11-05 MED ORDER — CHLORHEXIDINE GLUCONATE 4 % EX LIQD: 60.0000 mL | Freq: Once | CUTANEOUS | Status: DC

## 2012-11-05 MED ORDER — ROCURONIUM BROMIDE 100 MG/10ML IV SOLN
INTRAVENOUS | Status: DC | PRN
  Administered 2012-11-05: 40 mg via INTRAVENOUS

## 2012-11-05 MED ORDER — NEOSTIGMINE METHYLSULFATE 1 MG/ML IJ SOLN
INTRAMUSCULAR | Status: DC | PRN
  Administered 2012-11-05: 4 mg via INTRAVENOUS

## 2012-11-05 MED ORDER — OXYCODONE-ACETAMINOPHEN 5-325 MG PO TABS
1.0000 | ORAL_TABLET | ORAL | Status: DC | PRN
  Administered 2012-11-05: 2 via ORAL
  Filled 2012-11-05: qty 2

## 2012-11-05 MED ORDER — MIDAZOLAM HCL 5 MG/5ML IJ SOLN
INTRAMUSCULAR | Status: DC | PRN
  Administered 2012-11-05: 2 mg via INTRAVENOUS

## 2012-11-05 MED ORDER — CEFAZOLIN SODIUM-DEXTROSE 2-3 GM-% IV SOLR
INTRAVENOUS | Status: DC | PRN
  Administered 2012-11-05: 2 g via INTRAVENOUS

## 2012-11-05 MED ORDER — HYDROMORPHONE HCL PF 1 MG/ML IJ SOLN
INTRAMUSCULAR | Status: AC
  Filled 2012-11-05: qty 1

## 2012-11-05 MED ORDER — ACETAMINOPHEN 10 MG/ML IV SOLN
INTRAVENOUS | Status: DC | PRN
  Administered 2012-11-05: 1000 mg via INTRAVENOUS

## 2012-11-05 MED ORDER — LACTATED RINGERS IV SOLN
INTRAVENOUS | Status: DC | PRN
  Administered 2012-11-05 (×2): via INTRAVENOUS

## 2012-11-05 MED ORDER — METOPROLOL SUCCINATE ER 25 MG PO TB24
25.0000 mg | ORAL_TABLET | Freq: Every day | ORAL | Status: DC
  Administered 2012-11-05: 25 mg via ORAL
  Filled 2012-11-05: qty 1

## 2012-11-05 MED ORDER — HYDROMORPHONE HCL PF 1 MG/ML IJ SOLN
1.0000 mg | INTRAMUSCULAR | Status: DC | PRN
  Administered 2012-11-05 (×2): 1 mg via INTRAVENOUS
  Filled 2012-11-05: qty 1

## 2012-11-05 MED ORDER — CEFAZOLIN SODIUM-DEXTROSE 2-3 GM-% IV SOLR
2.0000 g | Freq: Four times a day (QID) | INTRAVENOUS | Status: DC
  Administered 2012-11-05 (×2): 2 g via INTRAVENOUS
  Filled 2012-11-05 (×3): qty 50

## 2012-11-05 MED ORDER — OXYCODONE-ACETAMINOPHEN 5-325 MG PO TABS: 1.0000 | ORAL_TABLET | Freq: Four times a day (QID) | ORAL | Status: DC | PRN

## 2012-11-05 MED ORDER — ONDANSETRON HCL 4 MG/2ML IJ SOLN: 4.0000 mg | Freq: Four times a day (QID) | INTRAMUSCULAR | Status: DC | PRN

## 2012-11-05 MED ORDER — HYDROMORPHONE HCL PF 1 MG/ML IJ SOLN
1.0000 mg | Freq: Once | INTRAMUSCULAR | Status: AC
  Administered 2012-11-05: 1 mg via INTRAVENOUS
  Filled 2012-11-05: qty 1

## 2012-11-05 MED ORDER — PROPOFOL 10 MG/ML IV BOLUS
INTRAVENOUS | Status: DC | PRN
  Administered 2012-11-05: 130 mg via INTRAVENOUS

## 2012-11-05 MED ORDER — CEPHALEXIN 500 MG PO CAPS: 500.0000 mg | ORAL_CAPSULE | Freq: Three times a day (TID) | ORAL | Status: DC

## 2012-11-05 MED ORDER — SODIUM CHLORIDE 0.9 % IV SOLN
INTRAVENOUS | Status: DC
  Administered 2012-11-05: 06:00:00 via INTRAVENOUS

## 2012-11-05 MED ORDER — GLYCOPYRROLATE 0.2 MG/ML IJ SOLN
INTRAMUSCULAR | Status: DC | PRN
  Administered 2012-11-05: 0.6 mg via INTRAVENOUS

## 2012-11-05 MED ORDER — CEFAZOLIN SODIUM-DEXTROSE 2-3 GM-% IV SOLR: 2.0000 g | INTRAVENOUS | Status: DC

## 2012-11-05 MED ORDER — LIDOCAINE HCL (CARDIAC) 20 MG/ML IV SOLN
INTRAVENOUS | Status: DC | PRN
  Administered 2012-11-05: 40 mg via INTRAVENOUS

## 2012-11-05 SURGICAL SUPPLY — 63 items
BANDAGE ELASTIC 4 VELCRO ST LF (GAUZE/BANDAGES/DRESSINGS) ×2 IMPLANT
BANDAGE ELASTIC 6 VELCRO ST LF (GAUZE/BANDAGES/DRESSINGS) ×2 IMPLANT
BANDAGE ESMARK 6X9 LF (GAUZE/BANDAGES/DRESSINGS) ×1 IMPLANT
BIT DRILL 2.7X125 (BIT) ×2
BIT DRILL 2.7X125 FOR 3MM (BIT) ×1 IMPLANT
BLADE SURG 10 STRL SS (BLADE) IMPLANT
BLADE SURG ROTATE 9660 (MISCELLANEOUS) IMPLANT
BNDG ESMARK 6X9 LF (GAUZE/BANDAGES/DRESSINGS) ×2
CLOTH BEACON ORANGE TIMEOUT ST (SAFETY) ×2 IMPLANT
COVER MAYO STAND STRL (DRAPES) ×2 IMPLANT
COVER SURGICAL LIGHT HANDLE (MISCELLANEOUS) ×2 IMPLANT
CUFF TOURNIQUET SINGLE 34IN LL (TOURNIQUET CUFF) ×2 IMPLANT
CUFF TOURNIQUET SINGLE 44IN (TOURNIQUET CUFF) IMPLANT
DRAPE OEC MINIVIEW 54X84 (DRAPES) IMPLANT
DRAPE U-SHAPE 47X51 STRL (DRAPES) ×2 IMPLANT
DURAPREP 26ML APPLICATOR (WOUND CARE) IMPLANT
ELECT REM PT RETURN 9FT ADLT (ELECTROSURGICAL) ×2
ELECTRODE REM PT RTRN 9FT ADLT (ELECTROSURGICAL) ×1 IMPLANT
GAUZE XEROFORM 1X8 LF (GAUZE/BANDAGES/DRESSINGS) IMPLANT
GAUZE XEROFORM 5X9 LF (GAUZE/BANDAGES/DRESSINGS) ×2 IMPLANT
GLOVE BIOGEL PI IND STRL 8 (GLOVE) ×2 IMPLANT
GLOVE BIOGEL PI INDICATOR 8 (GLOVE) ×2
GLOVE ECLIPSE 7.5 STRL STRAW (GLOVE) ×4 IMPLANT
GOWN PREVENTION PLUS XLARGE (GOWN DISPOSABLE) ×2 IMPLANT
GOWN SRG XL XLNG 56XLVL 4 (GOWN DISPOSABLE) ×2 IMPLANT
GOWN STRL NON-REIN LRG LVL3 (GOWN DISPOSABLE) ×2 IMPLANT
GOWN STRL NON-REIN XL XLG LVL4 (GOWN DISPOSABLE) ×2
K-WIRE 1.6 (WIRE) ×1
K-WIRE FX150X1.6XTROC PNT (WIRE) ×1
KIT BASIN OR (CUSTOM PROCEDURE TRAY) ×2 IMPLANT
KIT ROOM TURNOVER OR (KITS) ×2 IMPLANT
KWIRE FX150X1.6XTROC PNT (WIRE) ×1 IMPLANT
MANIFOLD NEPTUNE II (INSTRUMENTS) ×2 IMPLANT
NS IRRIG 1000ML POUR BTL (IV SOLUTION) ×2 IMPLANT
PACK ORTHO EXTREMITY (CUSTOM PROCEDURE TRAY) ×2 IMPLANT
PAD ARMBOARD 7.5X6 YLW CONV (MISCELLANEOUS) ×4 IMPLANT
PAD CAST 4YDX4 CTTN HI CHSV (CAST SUPPLIES) ×1 IMPLANT
PADDING CAST COTTON 4X4 STRL (CAST SUPPLIES) ×1
PADDING CAST SYNTHETIC 4 (CAST SUPPLIES) ×1
PADDING CAST SYNTHETIC 4X4 STR (CAST SUPPLIES) ×1 IMPLANT
PLATE ACE 100DEG 8HOLE (Plate) ×2 IMPLANT
SCREW ACE CAN 4.0 44M (Screw) ×2 IMPLANT
SCREW ACE CAN 4.0 46M (Screw) ×2 IMPLANT
SCREW ACE COR 18M (Screw) ×2 IMPLANT
SCREW ACE COR 20M (Screw) ×2 IMPLANT
SCREW CORTICAL 3.5MM  12MM (Screw) ×4 IMPLANT
SCREW CORTICAL 3.5MM 12MM (Screw) ×4 IMPLANT
SCREW NLOCK CANC HEX 4X14 (Screw) ×2 IMPLANT
SCREW NLOCK CANC HEX 4X16 (Screw) ×2 IMPLANT
SCREW NLOCK CANC HEX 4X18 (Screw) ×2 IMPLANT
SCREW NLOCK CANC HEX 4X18 FIB (Screw) ×2 IMPLANT
SPONGE GAUZE 4X4 12PLY (GAUZE/BANDAGES/DRESSINGS) ×2 IMPLANT
SPONGE LAP 4X18 X RAY DECT (DISPOSABLE) IMPLANT
STAPLER VISISTAT 35W (STAPLE) IMPLANT
SUCTION FRAZIER TIP 10 FR DISP (SUCTIONS) ×2 IMPLANT
SUT ETHILON 4 0 PS 2 18 (SUTURE) IMPLANT
SUT VIC AB 0 CTB1 27 (SUTURE) IMPLANT
SUT VIC AB 2-0 FS1 27 (SUTURE) IMPLANT
SYR CONTROL 10ML LL (SYRINGE) IMPLANT
TOWEL OR 17X24 6PK STRL BLUE (TOWEL DISPOSABLE) ×2 IMPLANT
TOWEL OR 17X26 10 PK STRL BLUE (TOWEL DISPOSABLE) ×2 IMPLANT
TUBE CONNECTING 12X1/4 (SUCTIONS) ×2 IMPLANT
WATER STERILE IRR 1000ML POUR (IV SOLUTION) ×2 IMPLANT

## 2012-11-05 NOTE — Brief Op Note (Signed)
11/04/2012 - 11/05/2012  3:51 AM  PATIENT:  [name]  57 y.o. female  PRE-OPERATIVE DIAGNOSIS:  Ankle fracture, right, open type I or II, initial encounter [824.9]  POST-OPERATIVE DIAGNOSIS:  * No post-op diagnosis entered *  PROCEDURE:  Procedure(s): OPEN REDUCTION INTERNAL FIXATION (ORIF) ANKLE FRACTURE only operating on right (Bilateral)  SURGEON:  Surgeon(s) and Role:    * Harvie Junior, MD - Primary  PHYSICIAN ASSISTANT:   ASSISTANTS: bethune   ANESTHESIA:   general  EBL:  Total I/O In: 1000 [I.V.:1000] Out: -   BLOOD ADMINISTERED:none  DRAINS: none   LOCAL MEDICATIONS USED:  MARCAINE     SPECIMEN:  No Specimen  DISPOSITION OF SPECIMEN:  N/A  COUNTS:  YES  TOURNIQUET:  * Missing tourniquet times found for documented tourniquets in log:  [id number] *  DICTATION: .Other Dictation: Dictation Number [id number]  PLAN OF CARE: Admit to inpatient   PATIENT DISPOSITION:  PACU - hemodynamically stable.   Delay start of Pharmacological VTE agent (>24hrs) due to surgical blood loss or risk of bleeding: no

## 2012-11-05 NOTE — Progress Notes (Signed)
Patient has been given discharge instructions along with prescription. Home health is in place. Patient has instructions on follow up appointment. She verbalizes understanding of the discharge instructions. Transport will be called once family arrives.

## 2012-11-05 NOTE — ED Provider Notes (Signed)
History     CSN: [id number]  Arrival date & time 11/04/12  2315   First MD Initiated Contact with Patient 11/04/12 2322      Chief Complaint  Patient presents with  . Fall  . Ankle Pain    (Consider location/radiation/quality/duration/timing/severity/associated sxs/prior treatment) Patient is a 57 y.o. female presenting with fall and ankle pain. The history is provided by the patient and medical records. No language interpreter was used.  Fall The accident occurred less than 1 hour ago. The fall occurred while walking. She fell from a height of 3 to 5 ft. She landed on concrete. The volume of blood lost was minimal. Point of impact: buttock. Pain location: ankles. The pain is at a severity of 6/10. The pain is moderate. She was not ambulatory at the scene. There was no entrapment after the fall. There was no drug use involved in the accident. There was no alcohol use involved in the accident. Pertinent negatives include no visual change, no fever, no numbness, no abdominal pain, no bowel incontinence, no nausea, no vomiting, no hematuria, no headaches, no hearing loss, no loss of consciousness and no tingling. The symptoms are aggravated by activity and pressure on the injury. Treatment on scene includes a backboard (bilateral ankle splints). Treatments tried: Fentanyl via EMS. The treatment provided moderate relief.  Ankle Pain Associated symptoms: no back pain, no fever and no neck pain     [name] is a 57 y.o. female  with a hx of A. fib on Coumadin, GERD, pulmonary hypertension and rheumatic heart disease presents to the Emergency Department complaining of acute fall at home approximately 30 minutes prior to arrival. Patient states she tripped on the front stairs, her feet slid out from underneath her and she slid down on her buttock.  Patient denies hitting her head, neck or back pain. Patient also denies loss of consciousness.  Patient via EMS fully immobilized with both legs  splinted.  EMS reports patient alert and oriented throughout time with them. Swelling and ecchymosis to the bilateral ankles with laceration and bleeding to the right ankle. Patient states retinal EMS gave her makes her pain better palpation and movement of her ankles makes it worse. Patient denies fever, chills, headache, neck pain, back pain, chest pain, shortness of breath, abdominal pain, nausea, vomiting, diarrhea, weakness, dizziness, numbness, tingling, syncope.  Past Medical History  Diagnosis Date  . Rheumatic heart disease   . HTN (hypertension)     unspecified  . Pulmonary HTN     mild  . Hypercholesterolemia   . GERD (gastroesophageal reflux disease)   . Goiter     multinodular  . Atrial fibrillation     Past Surgical History  Procedure Laterality Date  . Wrist arthroscopy    . Endoscopic retrograde cholangiopancreatography with stent placement and stone removal    . Carpal tunnel release    . Cardiac valve replacement    . Cardiac catheterization    . Neck surgery    . Ercp  08/15/2011    Procedure: ENDOSCOPIC RETROGRADE CHOLANGIOPANCREATOGRAPHY (ERCP);  Surgeon: Petra Kuba, MD;  Location: [facility] ENDOSCOPY;  Service: Endoscopy;  Laterality: N/A;    Family History  Problem Relation Age of Onset  . Hypothyroidism Sister   . Gallbladder disease Sister   . Coronary artery disease    . Diabetes Mother   . Hypertension Mother   . Heart disease Father   . Stroke Father     History  Substance  Use Topics  . Smoking status: Never Smoker   . Smokeless tobacco: Not on file  . Alcohol Use: No    OB History   Grav Para Term Preterm Abortions TAB SAB Ect Mult Living                  Review of Systems  Constitutional: Negative for fever.  HENT: Negative for neck pain and neck stiffness.   Eyes: Negative for visual disturbance.  Respiratory: Negative for shortness of breath.   Cardiovascular: Negative for chest pain.  Gastrointestinal: Negative for nausea, vomiting,  abdominal pain and bowel incontinence.  Genitourinary: Negative for hematuria.  Musculoskeletal: Positive for myalgias, joint swelling, arthralgias and gait problem. Negative for back pain.  Skin: Positive for color change and wound.  Neurological: Negative for tingling, loss of consciousness, numbness and headaches.  Hematological: Does not bruise/bleed easily.  Psychiatric/Behavioral: The patient is not nervous/anxious.   All other systems reviewed and are negative.    Allergies  Review of patient's allergies indicates no known allergies.  Home Medications   Current Outpatient Rx  Name  Route  Sig  Dispense  Refill  . atorvastatin (LIPITOR) 40 MG tablet   Oral   Take 40 mg by mouth daily.         . digoxin (LANOXIN) 0.25 MG tablet   Oral   Take 250 mcg by mouth daily.         . metoprolol succinate (TOPROL-XL) 25 MG 24 hr tablet   Oral   Take 25 mg by mouth daily.         Marland Kitchen omeprazole (PRILOSEC) 20 MG capsule   Oral   Take 20 mg by mouth daily.           . pantoprazole (PROTONIX) 40 MG tablet   Oral   Take 40 mg by mouth daily at 12 noon.         . warfarin (COUMADIN) 5 MG tablet   Oral   Take 5 mg by mouth daily.           BP 134/68  Pulse 73  Temp(Src) 97.7 F (36.5 C) (Oral)  Resp 16  Ht 5' 3.5" (1.613 m)  Wt 174 lb (78.926 kg)  BMI 30.34 kg/m2  SpO2 96%  Physical Exam  Nursing note and vitals reviewed. Constitutional: She appears well-developed and well-nourished. No distress.  HENT:  Head: Normocephalic and atraumatic.  Mouth/Throat: Oropharynx is clear and moist.  No ecchymosis or contusion noted to the head  Eyes: Conjunctivae are normal.  Neck: Normal range of motion and full passive range of motion without pain. No spinous process tenderness and no muscular tenderness present. No rigidity. Normal range of motion present.  No midline tenderness or paraspinal tenderness  Cardiovascular: Normal rate and intact distal pulses.  An  irregularly irregular rhythm present.  Murmur heard. Pulses:      Radial pulses are 2+ on the right side, and 2+ on the left side.       Dorsalis pedis pulses are 2+ on the right side, and 2+ on the left side.  Capillary refill < 3 seconds in lower extremities bilaterally  Pulmonary/Chest: Effort normal and breath sounds normal. No respiratory distress. She has no wheezes. She has no rales. She exhibits no tenderness.  Abdominal: Soft. Bowel sounds are normal. She exhibits no distension and no mass. There is no tenderness. There is no guarding.  Musculoskeletal: She exhibits tenderness. She exhibits no edema.  Right ankle: She exhibits decreased range of motion, swelling, ecchymosis, deformity and laceration. She exhibits normal pulse. Tenderness. Lateral malleolus and medial malleolus tenderness found.       Left ankle: She exhibits decreased range of motion, swelling and ecchymosis. She exhibits no deformity, no laceration and normal pulse. Tenderness. Lateral malleolus tenderness found.       Feet:  ROM: Decreased range of motion in lower extremities bilaterally  Significant ecchymosis with open laceration of the right ankle Ecchymosis and swelling of the left ankle without laceration or open wound  Lymphadenopathy:    She has no cervical adenopathy.  Neurological: She is alert. Coordination normal. GCS eye subscore is 4. GCS verbal subscore is 5. GCS motor subscore is 6.  Sensation to dull and sharp intact in the bilateral lower extremities Strength decreased in bilateral lower extremities secondary to pain and injury, patient able move the knees and hips bilaterally without difficulty  Skin: Skin is warm and dry. She is not diaphoretic.  Mild tenting of the skin of the right ankle  Psychiatric: She has a normal mood and affect.    ED Course  Procedures (including critical care time)  Labs Reviewed  CBC WITH DIFFERENTIAL  BASIC METABOLIC PANEL  PROTIME-INR   Dg Ankle  Complete Left  11/05/2012   *RADIOLOGY REPORT*  Clinical Data: Fall, swelling, pain.  LEFT ANKLE COMPLETE - 3+ VIEW  Comparison: Contemporaneous foot  Findings: Fracture of the tip of the lateral malleolus.  Mild fragmentation along the lateral margin of the calcaneus.  Ankle mortise appears intact.  Posterior and plantar calcaneal enthesopathic change.  Ankle joint effusion. Marked soft tissue swelling of the lateral ankle.  IMPRESSION: Fracture of the tip of the lateral malleolus.  Ankle mortise intact.  Fragmentation along the lateral margin of the calcaneus may reflect an acute fracture.   Original Report Authenticated By: Jearld Lesch, M.D.   Dg Ankle Complete Right  11/05/2012   *RADIOLOGY REPORT*  Clinical Data: Fall, open fracture  RIGHT ANKLE - COMPLETE 3+ VIEW  Comparison: Contralateral ankle  Findings: Transverse fracture medial malleolus.  Oblique fracture distal fibula with extension into the syndesmosis.  Posterior tibial fracture.  Posterior and lateral displacement the distal components at the ankle mortise.  Subcutaneous gas.  Posterior and plantar calcaneal enthesopathic change.  IMPRESSION: Trimalleolar fracture with ankle mortise disruption and posterolateral subluxation.   Original Report Authenticated By: Jearld Lesch, M.D.   Dg Foot Complete Left  11/05/2012   *RADIOLOGY REPORT*  Clinical Data: Fall, bilateral ankle pain and swelling.  LEFT FOOT - COMPLETE 3+ VIEW  Comparison: Contemporaneous ankle  Findings: Osteopenia.  No displaced fracture of the foot. Posterior and plantar calcaneal enthesopathic changes. Atherosclerotic vascular calcifications.  There is soft tissue swelling about the ankle.  Distal fibular fractures noted.  IMPRESSION: Distal fibular fracture.  See contemporaneous ankle report.   Original Report Authenticated By: Jearld Lesch, M.D.     1. Trimalleolar fracture, right, open type I or II, initial encounter   2. Lateral malleolar fracture, left,  closed, initial encounter   3. Fall at home, initial encounter       MDM  Concepcion Elk percent after fall with pain and deformity of both ankles.  Will give pain control and image.  X-ray right ankle with bimalleolar fracture with ankle mortise disruption and posterior lateral subluxation. Skin tenting in the area and small laceration makes this an open fracture with the need for ORIF tonight.  Left ankle with distal fibular fracture in the tip of the lateral malleolus with ankle mortise intact.  I personally reviewed the imaging tests through PACS system.  I reviewed available ER/hospitalization records through the EMR.    Bleeding controlled at this time. PT/INR, CBC and BMP pending.  Patient sees Dr. Luiz Blare with [facility].  Discussed the patient with him via phone, he has evaluated the images and come in tonight for ORIF.    Pt last oral intake at 6PM.    Dr. Marisa Severin was consulted, evaluated this patient with me and agrees with the plan.          Dahlia Client Maguire Killmer, PA-C 11/05/12 0151  Dierdre Forth, PA-C 11/05/12 0201

## 2012-11-05 NOTE — Preoperative (Signed)
Beta Blockers   Reason not to administer Beta Blockers:Not Applicable. Metoprolol @ 11/04/12 @0800 

## 2012-11-05 NOTE — Discharge Summary (Signed)
Patient ID: [name] BEGIN MRN: [id number] DOB/AGE: 02/02/1956 57 y.o.  Admit date: 11/04/2012 Discharge date: 11/05/2012  Admission Diagnoses:  Principal Problem:   Open bimalleolar fracture of right ankle Active Problems:   Fracture of lateral malleolus of left ankle   Discharge Diagnoses:  Same  Past Medical History  Diagnosis Date  . Rheumatic heart disease   . HTN (hypertension)     unspecified  . Pulmonary HTN     mild  . Hypercholesterolemia   . GERD (gastroesophageal reflux disease)   . Goiter     multinodular  . Atrial fibrillation     Surgeries: Procedure(s): OPEN REDUCTION INTERNAL FIXATION (ORIF) ANKLE FRACTURE only operating on right on 11/04/2012 - 11/05/2012   Discharged Condition: Improved  Hospital Course: [name] is an 57 y.o. female who was admitted 11/04/2012 for operative treatment ofOpen bimalleolar fracture of right ankle. Patient has severe unremitting pain that affects sleep, daily activities, and work/hobbies.She had a fall at home on the night of admission injuring both ankles. After pre-op clearance the patient was taken to the operating room on 11/04/2012 - 11/05/2012 and underwent  Procedure(s): OPEN REDUCTION INTERNAL FIXATION (ORIF) ANKLE FRACTURE only operating on right.    Patient was given perioperative antibiotics: Anti-infectives   Start     Dose/Rate Route Frequency Ordered Stop   11/05/12 0600  ceFAZolin (ANCEF) IVPB 2 g/50 mL premix  Status:  Discontinued     2 g 100 mL/hr over 30 Minutes Intravenous On call to O.R. 11/05/12 0509 11/05/12 0514   11/05/12 0600  ceFAZolin (ANCEF) IVPB 2 g/50 mL premix     2 g 100 mL/hr over 30 Minutes Intravenous Every 6 hours 11/05/12 0509 11/05/12 2359   11/05/12 0000  cephALEXin (KEFLEX) 500 MG capsule     500 mg Oral 3 times daily 11/05/12 0426         Patient was given early ambulation, and chemoprophylaxis to prevent DVT.She is on chronic coumadin.  Patient benefited maximally from  hospital stay and there were no complications.  She was evaluated by PT.  Recent vital signs: Patient Vitals for the past 24 hrs:  BP Temp Temp src Pulse Resp SpO2 Height Weight  11/05/12 0509 143/72 mmHg 97.5 F (36.4 C) Oral 69 18 100 % 5\' 3"  (1.6 m) 78.92 kg (173 lb 15.8 oz)  11/05/12 0445 141/79 mmHg 98.2 F (36.8 C) - 67 24 99 % - -  11/05/12 0430 139/79 mmHg - - 67 20 99 % - -  11/05/12 0420 130/65 mmHg 98.5 F (36.9 C) - 75 22 99 % - -  11/05/12 0045 134/68 mmHg 97.7 F (36.5 C) Oral 73 16 96 % - -  11/04/12 2324 135/69 mmHg 98.3 F (36.8 C) Oral 85 17 97 % 5' 3.5" (1.613 m) 78.926 kg (174 lb)     Recent laboratory studies:  Recent Labs  11/05/12 0106  WBC 14.3*  HGB 11.3*  HCT 34.7*  PLT 171  NA 137  K 3.7  CL 103  CO2 26  BUN 12  CREATININE 0.82  GLUCOSE 152*  INR 2.56*  CALCIUM 9.0     Discharge Medications:     Medication List    TAKE these medications       atorvastatin 40 MG tablet  Commonly known as:  LIPITOR  Take 40 mg by mouth daily.     cephALEXin 500 MG capsule  Commonly known as:  KEFLEX  Take 1 capsule (500 mg  total) by mouth 3 (three) times daily.     digoxin 0.25 MG tablet  Commonly known as:  LANOXIN  Take 250 mcg by mouth daily.     metoprolol succinate 25 MG 24 hr tablet  Commonly known as:  TOPROL-XL  Take 25 mg by mouth daily.     oxyCODONE-acetaminophen 5-325 MG per tablet  Commonly known as:  PERCOCET/ROXICET  Take 1-2 tablets by mouth every 6 (six) hours as needed for pain.     pantoprazole 40 MG tablet  Commonly known as:  PROTONIX  Take 40 mg by mouth daily at 12 noon.     PRILOSEC 20 MG capsule  Generic drug:  omeprazole  Take 20 mg by mouth daily.     warfarin 5 MG tablet  Commonly known as:  COUMADIN  Take 5 mg by mouth daily.        Diagnostic Studies: Dg Ankle Complete Left  12/04/2012   *RADIOLOGY REPORT*  Clinical Data: Fall, swelling, pain.  LEFT ANKLE COMPLETE - 3+ VIEW  Comparison:  Contemporaneous foot  Findings: Fracture of the tip of the lateral malleolus.  Mild fragmentation along the lateral margin of the calcaneus.  Ankle mortise appears intact.  Posterior and plantar calcaneal enthesopathic change.  Ankle joint effusion. Marked soft tissue swelling of the lateral ankle.  IMPRESSION: Fracture of the tip of the lateral malleolus.  Ankle mortise intact.  Fragmentation along the lateral margin of the calcaneus may reflect an acute fracture.   Original Report Authenticated By: Jearld Lesch, M.D.   Dg Ankle Complete Right  Dec 04, 2012   *RADIOLOGY REPORT*  Clinical Data: Trimalleolar fracture.  DG C-ARM 1-60 MIN,RIGHT ANKLE - COMPLETE 3+ VIEW  Fluoroscopic time:  8 seconds.  Comparison:  12-04-12.  Findings: Three intraoperative C-arm views submitted for review after surgery.  Trimalleolar fracture reduced with fibular side plate and screws, single screw of the fibula and medial malleolus.  On the lateral view, slight separation of fracture fragments.  Ankle mortise reduced.  IMPRESSION: Open reduction and internal fixation of right trimalleolar fracture.   Original Report Authenticated By: Lacy Duverney, M.D.   Dg Ankle Complete Right  Dec 04, 2012   *RADIOLOGY REPORT*  Clinical Data: Fall, open fracture  RIGHT ANKLE - COMPLETE 3+ VIEW  Comparison: Contralateral ankle  Findings: Transverse fracture medial malleolus.  Oblique fracture distal fibula with extension into the syndesmosis.  Posterior tibial fracture.  Posterior and lateral displacement the distal components at the ankle mortise.  Subcutaneous gas.  Posterior and plantar calcaneal enthesopathic change.  IMPRESSION: Trimalleolar fracture with ankle mortise disruption and posterolateral subluxation.   Original Report Authenticated By: Jearld Lesch, M.D.   Dg Foot Complete Left  12-04-2012   *RADIOLOGY REPORT*  Clinical Data: Fall, bilateral ankle pain and swelling.  LEFT FOOT - COMPLETE 3+ VIEW  Comparison:  Contemporaneous ankle  Findings: Osteopenia.  No displaced fracture of the foot. Posterior and plantar calcaneal enthesopathic changes. Atherosclerotic vascular calcifications.  There is soft tissue swelling about the ankle.  Distal fibular fractures noted.  IMPRESSION: Distal fibular fracture.  See contemporaneous ankle report.   Original Report Authenticated By: Jearld Lesch, M.D.   Dg C-arm 1-60 Min  2012-12-04   *RADIOLOGY REPORT*  Clinical Data: Trimalleolar fracture.  DG C-ARM 1-60 MIN,RIGHT ANKLE - COMPLETE 3+ VIEW  Fluoroscopic time:  8 seconds.  Comparison:  Dec 04, 2012.  Findings: Three intraoperative C-arm views submitted for review after surgery.  Trimalleolar fracture reduced with fibular side plate and  screws, single screw of the fibula and medial malleolus.  On the lateral view, slight separation of fracture fragments.  Ankle mortise reduced.  IMPRESSION: Open reduction and internal fixation of right trimalleolar fracture.   Original Report Authenticated By: Lacy Duverney, M.D.    Disposition: 01-Home or Self Care      Discharge Orders   Future Appointments Provider Department Dept Phone   11/22/2012 7:30 AM [facility]  [facility] [phone]   Future Orders Complete By Expires     Diet general  As directed     Increase activity slowly as tolerated  As directed     Non weight bearing  As directed     Scheduling Instructions:      On right leg    Weight bearing as tolerated  As directed     Scheduling Instructions:      On left leg       Follow-up Information   Follow up with GRAVES,JOHN L, MD. Schedule an appointment as soon as possible for a visit in 2 weeks.   Contact information:   [address] [place] [address] [phone]        Signed: Matthew Folks 11/05/2012, 1:53 PM

## 2012-11-05 NOTE — ED Notes (Signed)
Consent for surgical procedure placed in patient's room for the consultation with the orthopaedic surgeon.

## 2012-11-05 NOTE — Transfer of Care (Signed)
Immediate Anesthesia Transfer of Care Note  Patient: [name]  Procedure(s) Performed: Procedure(s): OPEN REDUCTION INTERNAL FIXATION (ORIF) ANKLE FRACTURE only operating on right (Bilateral)  Patient Location: PACU  Anesthesia Type:General  Level of Consciousness: awake and alert   Airway & Oxygen Therapy: Patient Spontanous Breathing and Patient connected to nasal cannula oxygen  Post-op Assessment: Report given to PACU RN and Post -op Vital signs reviewed and stable  Post vital signs: Reviewed and stable  Complications: No apparent anesthesia complications

## 2012-11-05 NOTE — Evaluation (Signed)
Physical Therapy Evaluation Patient Details Name: [name] MRN: [id number] DOB: April 14, 1956 Today's Date: 11/05/2012 Time: 4098-1191 PT Time Calculation (min): 41 min  PT Assessment / Plan / Recommendation Clinical Impression  Pt. was admitted with bilateral ankle fxs after a fall yesterday going out the steps of her home.  She underwent R ankle ORIF  and L ankle fx is being managed non surgically with a cam boot.  She is moving fairly well short distances with RW but is not able to negotiate steps either with RW or with seated boosting and 2 assist.  Recommend ambulance transport home to parents home where she will have 24 hour assist.  She also needs WC , already has RW and 3n1 in the parents home that are available for her use.  As DC is anticipated today per PT orders from MD, will not establish  goals and will sign off.    PT Assessment  All further PT needs can be met in the next venue of care    Follow Up Recommendations  Home health PT;Supervision/Assistance - 24 hour;Other (comment);Supervision for mobility/OOB [facility] aide)    Does the patient have the potential to tolerate intense rehabilitation      Barriers to Discharge        Equipment Recommendations  Wheelchair (measurements PT);Wheelchair cushion (measurements PT);Other (comment) (WC with removable desk arms & elevating removable leg rests)    Recommendations for Other Services     Frequency      Precautions / Restrictions Precautions Precautions: Fall Required Braces or Orthoses: Other Brace/Splint Other Brace/Splint: cam walker L LE Restrictions Weight Bearing Restrictions: Yes RLE Weight Bearing: Non weight bearing LLE Weight Bearing: Weight bearing as tolerated Other Position/Activity Restrictions: L LE with cam walker   Pertinent Vitals/Pain See vitals tab      Mobility  Bed Mobility Bed Mobility: Supine to Sit;Sitting - Scoot to Edge of Bed Supine to Sit: 6: Modified independent (Device/Increase  time);HOB flat Sitting - Scoot to Edge of Bed: 6: Modified independent (Device/Increase time) Details for Bed Mobility Assistance: managing well without difficulty Transfers Transfers: Sit to Stand;Stand to Sit Sit to Stand: 4: Min assist;From bed Stand to Sit: 4: Min assist;To chair/3-in-1 Details for Transfer Assistance: cues for correct technique and hand placement, min assist to rise and to control descent Ambulation/Gait Ambulation/Gait Assistance: 4: Min assist Ambulation Distance (Feet): 10 Feet Assistive device: Rolling walker Ambulation/Gait Assistance Details: pt. compliant with NWB right LE, needs extra time to walk due to B ankle fxs and pain. Gait Pattern:  (single leg hop) Gait velocity: decreased Stairs: Yes Stairs Assistance: 1: +2 Total assist;Patient percentage (comment);Other (comment) (40) Stairs Assistance Details (indicate cue type and reason): Attempted to have pt. step up backward with L LE but she was unable to boost self adequately for her to be safe in attempt.  Also attempted seated boost which also required +2 assist pt. 40 % and pt. unable to adequately boost to go up even a single step.  Further attempts deferred. Stair Management Technique: No rails;Backwards;Seated/boosting;With walker Number of Stairs: 1    Exercises     PT Diagnosis: Difficulty walking;Acute pain  PT Problem List: Decreased activity tolerance;Decreased balance;Decreased mobility;Decreased knowledge of use of DME;Decreased knowledge of precautions;Pain PT Treatment Interventions:     PT Goals    Visit Information  Last PT Received On: 11/05/12 Assistance Needed: +1    Subjective Data  Subjective: I live alone but will be going to my Mom and  Dad's Patient Stated Goal: return to  work   Prior [employer] Living Lives With: Alone;Family (plans to go home to parents home) Available Help at Discharge: Available 24 hours/day;Family Type of Home: House Home Access: Stairs to  enter [employer] of Steps: 4 Entrance Stairs-Rails: Right;Left;Can reach both Home Layout: One level Bathroom Shower/Tub: Walk-in Contractor: Standard Bathroom Accessibility: Yes How Accessible: Accessible via walker Home Adaptive Equipment: Bedside commode/3-in-1;Walker - rolling;Built-in shower seat Prior Function Level of Independence: Independent Able to Take Stairs?: Yes Driving: Yes Vocation: Full time employment Communication Communication: No difficulties    Cognition  Cognition Arousal/Alertness: Awake/alert    Extremity/Trunk Assessment Right Upper Extremity Assessment RUE ROM/Strength/Tone: WFL for tasks assessed RUE Sensation: WFL - Light Touch Left Upper Extremity Assessment LUE ROM/Strength/Tone: WFL for tasks assessed LUE Sensation: WFL - Light Touch Right Lower Extremity Assessment RLE ROM/Strength/Tone: Deficits;Unable to fully assess;Due to precautions RLE ROM/Strength/Tone Deficits: able to lift leg off bed independently with increased time RLE Sensation: WFL - Light Touch (toes) Left Lower Extremity Assessment LLE ROM/Strength/Tone: Deficits;Unable to fully assess;Due to precautions LLE ROM/Strength/Tone Deficits: able to lift LE off bed independently, no increased time needed LLE Sensation: WFL - Light Touch (toes) Trunk Assessment Trunk Assessment: Normal   Balance Balance Balance Assessed: Yes Static Sitting Balance Static Sitting - Balance Support: Feet supported;No upper extremity supported Static Sitting - Level of Assistance: 7: Independent Static Standing Balance Static Standing - Balance Support: Bilateral upper extremity supported;During functional activity Static Standing - Level of Assistance: 4: Min assist;Other (comment) (with RW)  End of Session PT - End of Session Equipment Utilized During Treatment: Gait belt;Other (comment) (L cam walker) Activity Tolerance: Patient tolerated treatment well Patient  left: in chair;with call bell/phone within reach Nurse Communication: Mobility status;Patient requests pain meds;Other (comment) (need for wc, HHPT and ambulance transport home)  GP Functional Assessment Tool Used: clinical judgement Functional Limitation: Mobility: Walking and moving around Mobility: Walking and Moving Around Current Status (B1478): At least 60 percent but less than 80 percent impaired, limited or restricted Mobility: Walking and Moving Around Goal Status [phone]): At least 60 percent but less than 80 percent impaired, limited or restricted Mobility: Walking and Moving Around Discharge Status [phone]): At least 60 percent but less than 80 percent impaired, limited or restricted   Ferman Hamming 11/05/2012, 9:51 AM Weldon Picking PT Acute Rehab Services [phone] [place] [phone]

## 2012-11-05 NOTE — Progress Notes (Signed)
Subjective: Day of Surgery Procedure(s) (LRB): OPEN REDUCTION INTERNAL FIXATION (ORIF) ANKLE FRACTURE only operating on right (Right) Patient reports pain as 3 on 0-10 scale.   Up in chair. Taking po/voiding ok.  Objective: Vital signs in last 24 hours: Temp:  [97.5 F (36.4 C)-98.5 F (36.9 C)] 97.5 F (36.4 C) (05/16 0509) Pulse Rate:  [67-85] 69 (05/16 0509) Resp:  [16-24] 18 (05/16 0509) BP: (130-143)/(65-79) 143/72 mmHg (05/16 0509) SpO2:  [96 %-100 %] 100 % (05/16 0509) Weight:  [78.92 kg (173 lb 15.8 oz)-78.926 kg (174 lb)] 78.92 kg (173 lb 15.8 oz) (05/16 0509)  Intake/Output from previous day: 05/15 0701 - 05/16 0700 In: 1800 [I.V.:1800] Out: 0  Intake/Output this shift:     Recent Labs  11/05/12 0106  HGB 11.3*    Recent Labs  11/05/12 0106  WBC 14.3*  RBC 4.10  HCT 34.7*  PLT 171    Recent Labs  11/05/12 0106  NA 137  K 3.7  CL 103  CO2 26  BUN 12  CREATININE 0.82  GLUCOSE 152*  CALCIUM 9.0    Recent Labs  11/05/12 0106  INR 2.56*  Right leg exam: Posterior splint intact Left leg: Cam Walker in Place Neurologically intact Neurovascular intact Sensation intact distally  Assessment/Plan: Day of Surgery Procedure(s) (LRB): OPEN REDUCTION INTERNAL FIXATION (ORIF) ANKLE FRACTURE only operating on right (Right) Left distal fibula FX Plan: Discharge home after next dose of IV Ancef given. Will send home on Keflex/Percocet. F/U Dr Luiz Blare in 2 weeks.  [name] G 11/05/2012, 11:32 AM

## 2012-11-05 NOTE — H&P (Signed)
[name] is an 57 y.o. female.  HPI: 57 year old female who fell down the stairs earlier tonight while walking dog.  She presents emergency room where she is noted to have bilateral ankle fractures.  The right side is open, oosing, and tenting the skin.we are consulted for management of the ankle fractures.  Past Medical History  Diagnosis Date  . Rheumatic heart disease   . HTN (hypertension)     unspecified  . Pulmonary HTN     mild  . Hypercholesterolemia   . GERD (gastroesophageal reflux disease)   . Goiter     multinodular  . Atrial fibrillation     Past Surgical History  Procedure Laterality Date  . Wrist arthroscopy    . Endoscopic retrograde cholangiopancreatography with stent placement and stone removal    . Carpal tunnel release    . Cardiac valve replacement    . Cardiac catheterization    . Neck surgery    . Ercp  08/15/2011    Procedure: ENDOSCOPIC RETROGRADE CHOLANGIOPANCREATOGRAPHY (ERCP);  Surgeon: Joanna Kuba, MD;  Location[facility] ENDOSCOPY;  Service: Endoscopy;  Laterality: N/A;    Family History  Problem Relation Age of Onset  . Hypothyroidism Sister   . Gallbladder disease Sister   . Coronary artery disease    . Diabetes Mother   . Hypertension Mother   . Heart disease Father   . Stroke Father     Social History:  reports that she has never smoked. She does not have any smokeless tobacco history on file. She reports that she does not drink alcohol or use illicit drugs.  Allergies: No Known Allergies  Medications: I have reviewed the patient's current medications.  No results found for this or any previous visit (from the past 48 hour(s)).  Dg Ankle Complete Left  11/05/2012   *RADIOLOGY REPORT*  Clinical Data: Fall, swelling, pain.  LEFT ANKLE COMPLETE - 3+ VIEW  Comparison: Contemporaneous foot  Findings: Fracture of the tip of the lateral malleolus.  Mild fragmentation along the lateral margin of the calcaneus.  Ankle mortise appears  intact.  Posterior and plantar calcaneal enthesopathic change.  Ankle joint effusion. Marked soft tissue swelling of the lateral ankle.  IMPRESSION: Fracture of the tip of the lateral malleolus.  Ankle mortise intact.  Fragmentation along the lateral margin of the calcaneus may reflect an acute fracture.   Original Report Authenticated By: Jearld Lesch, M.D.   Dg Ankle Complete Right  11/05/2012   *RADIOLOGY REPORT*  Clinical Data: Fall, open fracture  RIGHT ANKLE - COMPLETE 3+ VIEW  Comparison: Contralateral ankle  Findings: Transverse fracture medial malleolus.  Oblique fracture distal fibula with extension into the syndesmosis.  Posterior tibial fracture.  Posterior and lateral displacement the distal components at the ankle mortise.  Subcutaneous gas.  Posterior and plantar calcaneal enthesopathic change.  IMPRESSION: Trimalleolar fracture with ankle mortise disruption and posterolateral subluxation.   Original Report Authenticated By: Jearld Lesch, M.D.   Dg Foot Complete Left  11/05/2012   *RADIOLOGY REPORT*  Clinical Data: Fall, bilateral ankle pain and swelling.  LEFT FOOT - COMPLETE 3+ VIEW  Comparison: Contemporaneous ankle  Findings: Osteopenia.  No displaced fracture of the foot. Posterior and plantar calcaneal enthesopathic changes. Atherosclerotic vascular calcifications.  There is soft tissue swelling about the ankle.  Distal fibular fractures noted.  IMPRESSION: Distal fibular fracture.  See contemporaneous ankle report.   Original Report Authenticated By: Jearld Lesch, M.D.  ROS: I have reviewed the patient's review of systems thoroughly and there are no positive responses as relates to the HPI. EXAM:  Blood pressure 134/68, pulse 73, temperature 97.7 F (36.5 C), temperature source Oral, resp. rate 16, height 5' 3.5" (1.613 m), weight 78.926 kg (174 lb), SpO2 96.00%. Well-developed well-nourished patient in no acute distress. Alert and oriented x3 HEENT:within normal  limits Cardiac: Regular rate and rhythm Pulmonary: Lungs clear to auscultation Abdomen: Soft and nontender.  Normal active bowel sounds  Musculoskeletal: (right ankle shows small punctate wound over the medial aspect.  There is bleeding from the wound.  There is slight tenting of the skin.  Left ankle is tender to palpation over the lateral aspect.  There is pain to range of motion.)  Assessment/Plan: 57 year old female who fell down the stairs earlier today walking dog.  She has bilateral ankle fractures with the right side being open and tenting the skin.//She will be admitted to the hospital and taken operating room for open reduction and internal fixation of her right ankle fracture.  The left ankle fracture can be treated nonoperatively.  She will need to be admitted to make sure that she is able to bear weight fully and to be discharged home when appropriate fully cleared by physical therapy.  Oris Calmes L 11/05/2012, 1:22 AM

## 2012-11-05 NOTE — Plan of Care (Signed)
Problem: Phase III Progression Outcomes Goal: Activity at appropriate level-compared to baseline (UP IN CHAIR FOR HEMODIALYSIS)  Outcome: Adequate for Discharge Ambulation/transfers adequate for Dc.  Pt. Unable to negotiate steps so will need ambulance transport home and up 4 steps.

## 2012-11-05 NOTE — Op Note (Signed)
[name], [name]              ACCOUNT NO.:  [id number]  MEDICAL RECORD NO.:  [id number]  LOCATION:  6N14C                        FACILITY:  [facility]  PHYSICIAN:  Harvie Junior, M.D.   DATE OF BIRTH:  07/19/1955  DATE OF PROCEDURE:  11/05/2012 DATE OF DISCHARGE:  11/05/2012                              OPERATIVE REPORT   PREOPERATIVE DIAGNOSIS:  Displaced trimalleolar ankle fracture, grade 1 open.  POSTOPERATIVE DIAGNOSIS:  Displaced trimalleolar ankle fracture, grade 1 open.  PRINCIPAL PROCEDURE: 1. Irrigation and debridement of skin, subcutaneous tissue, fascia,     muscle and bone related to an open fracture. 2. Open reduction and internal fixation of right trimalleolar ankle     fracture with 2 46-mm cannulated screws, partially threaded on the     medial side and an 8-hole one-third tubular plate with     interfragmentary screw on the lateral side. 3. Closed reduction and splinting of left ankle fracture, lateral     malleolus fracture.  SURGEON:  Harvie Junior, M.D.  ASSISTANT:  Marshia Ly, P.A.  ANESTHESIA:  General.  BRIEF HISTORY:  [name] is a 57 year old female with history of having fallen down the stairs with her dog.  She suffered a trimalleolar ankle fracture on the right.  We were consulted for management.  She was also noted to have an ankle fracture on the left.  We were consulted for management as well.  Because of this, there was a grade 1 open with the skin tented.  I felt that it need to be done emergently.  She was brought to the operating room for this procedure.  PROCEDURE:  The patient was taken to the operating room.  After adequate level of anesthesia was obtained with general anesthetic, the patient was placed supine on the operating table.  The attention was then turned to the left ankle fracture where manipulative closed reduction was undertaken and a compressive wrap was placed and the patient was iced at this point.  At this  point, attention was turned to the right leg, which was prepped and draped in usual sterile fashion.  Following this, the leg was elevated for 3 minutes and the blood pressure was inflated to 350 mmHg.  Following this, an incision was made ellipsing the medial side incision and subcutaneous tissue down the level of the fracture on the medial side.  There was an excisional debridement of skin, subcutaneous tissue, muscle, fascia, and bone related to this open fracture and following this, pulsatile lavage irrigation with 3 liters of normal saline irrigation was instilled onto the wound to irrigate the wound thoroughly.  The joint was irrigated thoroughly at this point. Following this, the medial side was anatomically reduced and two cannulated screws were placed holding the fracture anatomically fixed. Once this was completed, attention was turned to the lateral side where an incision was made in the subcutaneous tissue down the level of the bone.  The fracture was identified, anatomically reduced, held in place. There was severe comminution really anteriorly and also posteriorly, but we were able I think to get the fibula out to length and hold it anatomically reduced and an interfragmentary screw was placed, 8-hole  was chosen just because of the comminution and we got the four cortical screws proximally, three cancellous distally and anatomic reduction was achieved here.  Once this was done, the wound was irrigated, suctioned dry.  The medial side of the skin was very tenuous.  We closed it with staples only and put a large piece of Xeroform over this area and the lateral side closed with 2-0 Vicryl and staples.  Sterile compressive dressing was applied as well as U in posterior splint on the right side and the patient will be taken to the recovery room and she was noted to be in the satisfactory condition.  Estimated blood loss for the procedure was none.     Harvie Junior,  M.D.     Ranae Plumber  D:  11/05/2012  T:  11/05/2012  Job:  [id number]

## 2012-11-05 NOTE — ED Provider Notes (Signed)
Medical screening examination/treatment/procedure(s) were conducted as a shared visit with non-physician practitioner(s) and myself.  I personally evaluated the patient during the encounter.  Pt s/p fall, has bilateral ankle fractures, open on the right.  Case was discussed with Dr Luiz Blare who plans to take patient to the OR for ORIF.  Pt updated on findings and plan.  Olivia Mackie, MD 11/05/12 0300

## 2012-11-05 NOTE — Anesthesia Preprocedure Evaluation (Addendum)
Anesthesia Evaluation  Patient identified by MRN, date of birth, ID band Patient awake    Reviewed: Allergy & Precautions, H&P , NPO status , Patient's Chart, lab work & pertinent test results, reviewed documented beta blocker date and time   Airway Mallampati: I      Dental  (+) Teeth Intact and Dental Advisory Given   Pulmonary  breath sounds clear to auscultation        Cardiovascular hypertension, Pt. on medications and Pt. on home beta blockers +CHF + dysrhythmias Atrial Fibrillation Rhythm:Irregular     Neuro/Psych    GI/Hepatic GERD-  Controlled,  Endo/Other  Hyperthyroidism   Renal/GU      Musculoskeletal   Abdominal   Peds  Hematology   Anesthesia Other Findings   Reproductive/Obstetrics                          Anesthesia Physical Anesthesia Plan  ASA: III  Anesthesia Plan: General   Post-op Pain Management:    Induction: Intravenous  Airway Management Planned: Oral ETT  Additional Equipment:   Intra-op Plan:   Post-operative Plan: Extubation in OR  Informed Consent: I have reviewed the patients History and Physical, chart, labs and discussed the procedure including the risks, benefits and alternatives for the proposed anesthesia with the patient or authorized representative who has indicated his/her understanding and acceptance.   Dental advisory given  Plan Discussed with: Anesthesiologist and Surgeon  Anesthesia Plan Comments: (Open Fracture R. Ankle S/P MVR with bileaflet mechanical valve 1997 H/O Rheumatic Fever Chronic Afib on coumadin INR 2.56  Plan GA with oral ETT  Kipp Brood, MD)       Anesthesia Quick Evaluation

## 2012-11-05 NOTE — Care Management Note (Signed)
  Page 1 of 1   11/05/2012     11:06:32 AM   CARE MANAGEMENT NOTE 11/05/2012  Patient:  [name], [name]   Account Number:  [id number]  Date Initiated:  11/05/2012  Documentation initiated by:  Ronny Flurry  Subjective/Objective Assessment:     Action/Plan:   Anticipated DC Date:  11/05/2012   Anticipated DC Plan:  HOME W HOME HEALTH SERVICES         Choice offered to / List presented to:             Status of service:  Completed, signed off Medicare Important Message given?   (If response is "NO", the following Medicare IM given date fields will be blank) Date Medicare IM given:   Date Additional Medicare IM given:    Discharge Disposition:  HOME W HOME HEALTH SERVICES  Per UR Regulation:  Reviewed for med. necessity/level of care/duration of stay  If discussed at Long Length of Stay Meetings, dates discussed:    Comments:  11-05-12 Spoke with patient and her parents at beside . Patient will be staying with parents at discharge. Parents address is [address] , [place] phone [phone] , patient's cell phone is [phone] .  Provided agency list to patient , first choice was [facility] , however , called Elnita Maxwell with [facility] she stated [facility] does not have the PT staff to accept patient .  Second choice is [facility] , spoke with Brynnly at  [facility]  and they are able to accept case. All required information  faxed to [facility] [phone], fax [phone]  Wheel chair ordered through [facility] , will be delivered to parents home.  Patient will be transported home via ambulance [facility] [id number].   Ronny Flurry RN BSN [id number]

## 2012-11-08 ENCOUNTER — Encounter (HOSPITAL_COMMUNITY): Payer: Self-pay | Admitting: Orthopedic Surgery

## 2012-11-22 ENCOUNTER — Ambulatory Visit (INDEPENDENT_AMBULATORY_CARE_PROVIDER_SITE_OTHER): Payer: BC Managed Care – PPO | Admitting: *Deleted

## 2012-11-22 DIAGNOSIS — I059 Rheumatic mitral valve disease, unspecified: Secondary | ICD-10-CM

## 2012-11-22 DIAGNOSIS — I4891 Unspecified atrial fibrillation: Secondary | ICD-10-CM

## 2012-11-22 DIAGNOSIS — Z9889 Other specified postprocedural states: Secondary | ICD-10-CM

## 2012-11-22 DIAGNOSIS — I482 Chronic atrial fibrillation, unspecified: Secondary | ICD-10-CM

## 2012-11-30 ENCOUNTER — Ambulatory Visit (INDEPENDENT_AMBULATORY_CARE_PROVIDER_SITE_OTHER): Payer: BC Managed Care – PPO | Admitting: *Deleted

## 2012-11-30 DIAGNOSIS — I4891 Unspecified atrial fibrillation: Secondary | ICD-10-CM

## 2012-11-30 DIAGNOSIS — I482 Chronic atrial fibrillation, unspecified: Secondary | ICD-10-CM

## 2012-11-30 DIAGNOSIS — I059 Rheumatic mitral valve disease, unspecified: Secondary | ICD-10-CM

## 2012-11-30 DIAGNOSIS — Z9889 Other specified postprocedural states: Secondary | ICD-10-CM

## 2012-12-10 ENCOUNTER — Ambulatory Visit (INDEPENDENT_AMBULATORY_CARE_PROVIDER_SITE_OTHER): Payer: BC Managed Care – PPO | Admitting: *Deleted

## 2012-12-10 DIAGNOSIS — I4891 Unspecified atrial fibrillation: Secondary | ICD-10-CM

## 2012-12-10 DIAGNOSIS — Z9889 Other specified postprocedural states: Secondary | ICD-10-CM

## 2012-12-10 DIAGNOSIS — I482 Chronic atrial fibrillation, unspecified: Secondary | ICD-10-CM

## 2012-12-10 DIAGNOSIS — I059 Rheumatic mitral valve disease, unspecified: Secondary | ICD-10-CM

## 2012-12-10 LAB — POCT INR: INR: 2.2

## 2012-12-27 ENCOUNTER — Ambulatory Visit (INDEPENDENT_AMBULATORY_CARE_PROVIDER_SITE_OTHER): Payer: BC Managed Care – PPO | Admitting: *Deleted

## 2012-12-27 ENCOUNTER — Encounter (HOSPITAL_BASED_OUTPATIENT_CLINIC_OR_DEPARTMENT_OTHER): Payer: BC Managed Care – PPO | Attending: Plastic Surgery

## 2012-12-27 DIAGNOSIS — Z9889 Other specified postprocedural states: Secondary | ICD-10-CM

## 2012-12-27 DIAGNOSIS — I4891 Unspecified atrial fibrillation: Secondary | ICD-10-CM

## 2012-12-27 DIAGNOSIS — Z79899 Other long term (current) drug therapy: Secondary | ICD-10-CM | POA: Insufficient documentation

## 2012-12-27 DIAGNOSIS — E039 Hypothyroidism, unspecified: Secondary | ICD-10-CM | POA: Insufficient documentation

## 2012-12-27 DIAGNOSIS — L97509 Non-pressure chronic ulcer of other part of unspecified foot with unspecified severity: Secondary | ICD-10-CM | POA: Insufficient documentation

## 2012-12-27 DIAGNOSIS — I482 Chronic atrial fibrillation, unspecified: Secondary | ICD-10-CM

## 2012-12-27 DIAGNOSIS — I10 Essential (primary) hypertension: Secondary | ICD-10-CM | POA: Insufficient documentation

## 2012-12-27 DIAGNOSIS — Z7901 Long term (current) use of anticoagulants: Secondary | ICD-10-CM | POA: Insufficient documentation

## 2012-12-27 DIAGNOSIS — I059 Rheumatic mitral valve disease, unspecified: Secondary | ICD-10-CM

## 2012-12-27 DIAGNOSIS — L918 Other hypertrophic disorders of the skin: Secondary | ICD-10-CM | POA: Insufficient documentation

## 2012-12-27 DIAGNOSIS — Z954 Presence of other heart-valve replacement: Secondary | ICD-10-CM | POA: Insufficient documentation

## 2012-12-27 LAB — POCT INR: INR: 3

## 2012-12-28 NOTE — Progress Notes (Signed)
[facility]  NAME:  [name], [name]              ACCOUNT NO.:  [id number]  MEDICAL RECORD NO.:  [id number]      DATE OF BIRTH:  06/27/1955  PHYSICIAN:  Wayland Denis, DO       VISIT DATE:  12/27/2012                                  OFFICE VISIT   HISTORY:  The patient is a 57 year old female who was sent here from Dr. Luiz Blare' office on right ankle ulcer.  She was involved in an accident and underwent open reduction internal fixation.  She now has an ulcer of the medial aspect of her right foot that is 4.8 x 4.2 x 0.2.  She has been dealing with this for several weeks now with little improvement, nothing seems to be making her better.  She is doing local dressing changes, wet-to-dry, for the moment.  PAST MEDICAL HISTORY:  Positive for hypertension, rheumatic fever, atrial fibrillation, and hyperthyroidism.  She had surgery includes cholecystectomy, ulnar shortening, and mitral valve replacement.  MEDICATIONS:  Warfarin, digoxin, __________, atorvastatin, and hydrocodone.  ALLERGIES:  She does not have any allergies.  REVIEW OF SYSTEMS:  Otherwise, negative.  PHYSICAL EXAMINATION:  On exam, she is alert, oriented, cooperative, very pleasant.  She is a good historian.  Her brother is with her.  She does have some pain and tenderness.  Her pulses are strong and regular. She does have some bruising around the area, likely related to the incident.  She does have some signs of peripheral vascular disease, it may be contributing to this.  Her pupils are equal and reactive to light and accommodation.  Her heart rate is regular.  Her breathing is unlabored.  Her abdomen is soft.  She does not have any cervical lymphadenopathy.  The wound is clean, but has a little bit of fibrous tissue that was debrided sharply over at the muscle level.  Recommend OR for irrigation and debridement in a controlled setting in order to be able to control the bleeding, and placement  of ACell and the VAC.  Also, elevation, multivitamin, vitamin C, zinc, and we will see her in a week while we work up in the OR.     Wayland Denis, DO     CS/MEDQ  D:  12/27/2012  T:  12/27/2012  Job:  [id number]

## 2013-01-03 NOTE — Progress Notes (Signed)
[facility]  NAME:  [name], [name]              ACCOUNT NO.:  [id number]  MEDICAL RECORD NO.:  [id number]      DATE OF BIRTH:  [birth year]  PHYSICIAN:  Wayland Denis, DO       VISIT DATE:  01/03/2013                                  OFFICE VISIT   The patient is a 57 year old female who is here for followup on her right lower extremity chronic ulcer that she originally got from trauma. There is severe hypergranulation.  I started to debride this and could tell it was going to bleed, wait too much, so stopped.  MEDICATIONS:  Unchanged.  SOCIAL HISTORY:  Unchanged.  PHYSICAL EXAMINATION:  On exam, she is alert, oriented, cooperative, not in any acute distress.  She is pleasant.  Pupils are equal.  Extraocular muscles are intact.  No cervical lymphadenopathy.  Her breathing is unlabored.  Her heart is regular.  Her Profore pulses are at this time strong and regular.  We will use [employer] and get her on the schedule for debridement ACell and VAC placement.  We will also send her for vascular evaluation to see if there is anything that can be done in regards to her vascular status.     Wayland Denis, DO     CS/MEDQ  D:  01/03/2013  T:  01/03/2013  Job:  [id number]

## 2013-01-04 ENCOUNTER — Other Ambulatory Visit: Payer: Self-pay | Admitting: Plastic Surgery

## 2013-01-04 ENCOUNTER — Telehealth: Payer: Self-pay | Admitting: Plastic Surgery

## 2013-01-04 ENCOUNTER — Other Ambulatory Visit: Payer: Self-pay | Admitting: Cardiology

## 2013-01-04 DIAGNOSIS — I739 Peripheral vascular disease, unspecified: Secondary | ICD-10-CM

## 2013-01-04 NOTE — Telephone Encounter (Signed)
Left message for patient to call and schedule testing ordered by Dr. Kelly Splinter

## 2013-01-07 ENCOUNTER — Ambulatory Visit (HOSPITAL_COMMUNITY)
Admission: RE | Admit: 2013-01-07 | Discharge: 2013-01-07 | Disposition: A | Discharge status: Home / Self Care | Payer: BC Managed Care – PPO | Source: Ambulatory Visit | Attending: Cardiovascular Disease | Admitting: Cardiovascular Disease

## 2013-01-07 DIAGNOSIS — L97509 Non-pressure chronic ulcer of other part of unspecified foot with unspecified severity: Secondary | ICD-10-CM

## 2013-01-07 DIAGNOSIS — I739 Peripheral vascular disease, unspecified: Secondary | ICD-10-CM

## 2013-01-07 NOTE — Progress Notes (Signed)
Arterial Lower Ext. Completed. Preliminary by tech ... Normal ABIs with no evidence of hemodynamically significant stenosis.  Marilynne Halsted, RDMS, RVT

## 2013-01-11 NOTE — Progress Notes (Signed)
[facility]  NAME:  LIVI, MCGANN              ACCOUNT NO.:  [id number]  MEDICAL RECORD NO.:  [id number]      DATE OF BIRTH:  03-29-1956  PHYSICIAN:  Wayland Denis, DO       VISIT DATE:  01/10/2013                                  OFFICE VISIT   The patient is a 57 year old female who is here for followup on her right lower extremity ulcer.  She had hardware placed and is still in place and has developed a large area of hypergranulation that it is extremely fibrous and vascularized.  Debridement was tried last visit and it bled quite a bit, therefore the decision was made to take her to the operating room, but she has stopped her anticoagulation.  There has been no change in her medications or social history.  On exam, she is alert, oriented, cooperative, not in any acute distress. She is pleasant.  Pupils are equal.  Extraocular muscles are intact.  No cervical lymphadenopathy.  Her breathing is unlabored.  Her heart is regular.  Her abdomen is soft.  Her wound is described above.  Pulses present.  Good capillary refill.  Recommend zinc around the area foam, dressing, and OR for debridement and placement of ACell.  Also her protein needs to be increased as well and I recommend protein supplements.  Continue with multivitamin and elevation.     Wayland Denis, DO     CS/MEDQ  D:  01/10/2013  T:  01/11/2013  Job:  [id number]

## 2013-01-24 ENCOUNTER — Ambulatory Visit (INDEPENDENT_AMBULATORY_CARE_PROVIDER_SITE_OTHER): Payer: BC Managed Care – PPO | Admitting: *Deleted

## 2013-01-24 ENCOUNTER — Encounter (HOSPITAL_BASED_OUTPATIENT_CLINIC_OR_DEPARTMENT_OTHER): Payer: BC Managed Care – PPO | Attending: Plastic Surgery

## 2013-01-24 DIAGNOSIS — I4891 Unspecified atrial fibrillation: Secondary | ICD-10-CM

## 2013-01-24 DIAGNOSIS — I482 Chronic atrial fibrillation, unspecified: Secondary | ICD-10-CM

## 2013-01-24 DIAGNOSIS — L97309 Non-pressure chronic ulcer of unspecified ankle with unspecified severity: Secondary | ICD-10-CM | POA: Insufficient documentation

## 2013-01-24 DIAGNOSIS — Z9889 Other specified postprocedural states: Secondary | ICD-10-CM

## 2013-01-24 DIAGNOSIS — I059 Rheumatic mitral valve disease, unspecified: Secondary | ICD-10-CM

## 2013-01-24 LAB — POCT INR: INR: 1.8

## 2013-01-28 ENCOUNTER — Encounter (HOSPITAL_BASED_OUTPATIENT_CLINIC_OR_DEPARTMENT_OTHER): Payer: Self-pay | Admitting: *Deleted

## 2013-01-28 DIAGNOSIS — L97309 Non-pressure chronic ulcer of unspecified ankle with unspecified severity: Secondary | ICD-10-CM | POA: Insufficient documentation

## 2013-01-31 ENCOUNTER — Ambulatory Visit (INDEPENDENT_AMBULATORY_CARE_PROVIDER_SITE_OTHER): Payer: BC Managed Care – PPO | Admitting: *Deleted

## 2013-01-31 ENCOUNTER — Telehealth: Payer: Self-pay | Admitting: *Deleted

## 2013-01-31 ENCOUNTER — Encounter (HOSPITAL_BASED_OUTPATIENT_CLINIC_OR_DEPARTMENT_OTHER): Payer: Self-pay | Admitting: *Deleted

## 2013-01-31 DIAGNOSIS — Z9889 Other specified postprocedural states: Secondary | ICD-10-CM

## 2013-01-31 DIAGNOSIS — I4891 Unspecified atrial fibrillation: Secondary | ICD-10-CM

## 2013-01-31 DIAGNOSIS — I482 Chronic atrial fibrillation, unspecified: Secondary | ICD-10-CM

## 2013-01-31 DIAGNOSIS — I059 Rheumatic mitral valve disease, unspecified: Secondary | ICD-10-CM

## 2013-01-31 LAB — POCT INR: INR: 2.5

## 2013-01-31 MED ORDER — ENOXAPARIN SODIUM 80 MG/0.8ML ~~LOC~~ SOLN: 80.0000 mg | Freq: Two times a day (BID) | SUBCUTANEOUS | Status: DC

## 2013-01-31 NOTE — Progress Notes (Addendum)
NPO AFTER MN. ARRIVES AT 1030. NEEDS ISTAT . CURRENT EKG IN EPIC AND CHART.  WILL TAKE PRILOSEC, METOPROLOL, AND DIGOXIN AM OF SURG W/ SIPS OF WATER.  LM FOR DR SANGER OR FOR PA TO VERIFY IF PT TO STOP COUMADIN, AWAITING FOR ORDERS. PT STATES WILL HAVE INR CHECKED ON Friday 02-04-2013, IF PT STOPS COUMADIN , Tuesday WOULD BE LAST DAY, WE CAN USE THIS RESULT. PT TO BE NOTIFIED, WHEN ORDER HAS BEEN VERIFIED.  RECEIVED CALL FROM SHAWN , DR SANGER'S PA, STATES THEY ARE COORDINATING W/ DR Maisie Fus, PT CARDIOLOGIST, FOR PT TO STOP COUMADIN AND BRIDGE W/ LOVENOX INJECTION. SPOKE W/ PT , NOTIFIED HER OF THIS AND SHE WILL BE GETTING A CALL FROM THE OFFICE WITH FURTHER INSTRUCTIONS AND RX.  PT VERBALIZED UNDERSTANDING.

## 2013-01-31 NOTE — Telephone Encounter (Signed)
Dr Kelly Splinter wants patient to hold coumadin 5 days prior to procedure Per Dr Daleen Squibb patient needs lovenox bridging Kg=79.08 Nuevo=0.82 Cc=92.21 lovenox 80 mg bid  8/12 last day of coumadin 8/13 no coumadin or lovenox 8/14 lovenox 80 mg 8 am and 8 pm 8/15 lovenox 80 mg 8 am and 8 pm 8/16 lovenox 80 mg 8 am and 8 pm 8/17 lovenox 80 mg (morning dose only) 8/18 no lovenox, no coumadin prior to procedure  Start your coumadin back when Dr Kelly Splinter says to start it back, you will start your lovenox back also 2 times/day until you return to coumadin clinic. When you start your coumadin back take an extra 1/2 tablet for 2 days to boost your INR so we can get you off lovenox injections.

## 2013-01-31 NOTE — Telephone Encounter (Signed)
Message copied by Carmela Hurt on Mon Jan 31, 2013 11:34 AM ------      Message from: Lisabeth Devoid F      Created: Mon Jan 31, 2013 11:11 AM      Regarding: RE: bridging       Pt needs bridging      Mylo Red RN            ----- Message -----         From: Carmela Hurt, RN         Sent: 01/31/2013   9:16 AM           To: Gaylord Shih, MD, Barrie Folk, RN, #      Subject: bridging                                                 02/07/2013      IRRIGATION AND DEBRIDEMENT OF RIGHT LOWER LEG WITH PLACEMENT OF ACELL AND WOULND VAC            Will patient need bridging?            Thanks, Addison Lank, RN       ------

## 2013-02-01 ENCOUNTER — Telehealth: Payer: Self-pay | Admitting: Cardiology

## 2013-02-01 ENCOUNTER — Other Ambulatory Visit: Payer: Self-pay | Admitting: Plastic Surgery

## 2013-02-01 DIAGNOSIS — L97912 Non-pressure chronic ulcer of unspecified part of right lower leg with fat layer exposed: Secondary | ICD-10-CM

## 2013-02-01 NOTE — H&P (Signed)
  This document contains confidential information from a [facility] medical record system and may be unauthenticated. Release may be made only with a valid authorization or in accordance with applicable policies of Medical Center or its affiliates. This document must be maintained in a secure manner or discarded/destroyed as required by Medical Center policy or by a confidential means such as shredding.   [name]  01/28/2013 9:00 AM   Initial consult  MRN:  [id number]  Provider: Wayland Denis, DO  Department: [facility]  Dept Phone: [phone]   Diagnoses    Ankle ulcer, right, with fat layer exposed [facility])    -  Primary   707.13     Reason for Visit   right Leg Ulcer   Skin Problem     Vitals - Last Recorded    143/85  95  98.4 F (36.9 C) (Oral)  20  1.6 m (5\' 3" )  77.111 kg (170 lb)        BMI SpO2    30.12 kg/m2  95%             Subjective:     Patient ID: [name] is a 57 y.o. female.  HPI The patient is a 57 yrs old wf here for evaluation and treatment of her right ankle ulcer.  In May she fell down the stairs while walking a dog. She was seen in the emergency room and diagnosed with bilateral ankle fractures. The right side was open, oosing, and tenting the skin.  She was taken to the OR and underwent ORIF of the right ankle fracture.  She is now in a boot and has a nonhealing ulcer over the site.  She has not responded to conservative management.  The area is 4.5 x 4.5 cm raised and hypergranulated.  She is on coumadin and is going to work with her PCP on bridging her anticoagulation so we can I and D the area.  The following portions of the patient's history were reviewed and updated as appropriate: allergies, current medications, past family history, past medical history, past social history, past surgical history and problem list.  Review of Systems  Constitutional: Negative.   HENT: Negative.   Eyes: Negative.   Respiratory:  Negative.   Cardiovascular: Negative.   Gastrointestinal: Negative.   Endocrine: Negative.   Hematological: Negative.   Psychiatric/Behavioral: Negative.        Objective:    Physical Exam  Constitutional: She appears well-developed and well-nourished.  HENT:   Head: Normocephalic and atraumatic.  Eyes: Pupils are equal, round, and reactive to light.  Cardiovascular: Normal rate.   Pulmonary/Chest: Effort normal.  Musculoskeletal:       Legs: Neurological: She is alert.  Skin: Skin is warm.  Psychiatric: She has a normal mood and affect. Her behavior is normal. Judgment and thought content normal.      Assessment:   1.  Ankle ulcer, right, with fat layer exposed (HCC)       Plan:     Plan for I and D with Acell and VAC placement. Risks and complications were reviewed.

## 2013-02-01 NOTE — Telephone Encounter (Signed)
Spoke with patient and advised Dr Daleen Squibb not in the office and unsure if he would do disability for Afib but would forward to him for review. She is also going to contact her orthopedic doctor for her ankle issues. Advised will call her back with Dr Vern Claude recommendations.

## 2013-02-01 NOTE — Telephone Encounter (Signed)
New prob  Pt is wanting to get a letter so she can be written out of work for disability.

## 2013-02-03 ENCOUNTER — Other Ambulatory Visit: Payer: Self-pay | Admitting: Plastic Surgery

## 2013-02-04 NOTE — Telephone Encounter (Signed)
From a cardiac standpoint, she is not disabled.

## 2013-02-07 ENCOUNTER — Ambulatory Visit (HOSPITAL_BASED_OUTPATIENT_CLINIC_OR_DEPARTMENT_OTHER): Payer: BC Managed Care – PPO | Admitting: Anesthesiology

## 2013-02-07 ENCOUNTER — Encounter (HOSPITAL_BASED_OUTPATIENT_CLINIC_OR_DEPARTMENT_OTHER): Payer: Self-pay | Admitting: Plastic Surgery

## 2013-02-07 ENCOUNTER — Encounter (HOSPITAL_BASED_OUTPATIENT_CLINIC_OR_DEPARTMENT_OTHER)
Admission: RE | Disposition: A | Discharge status: Home / Self Care | Payer: Self-pay | Source: Ambulatory Visit | Attending: Plastic Surgery

## 2013-02-07 ENCOUNTER — Ambulatory Visit (HOSPITAL_BASED_OUTPATIENT_CLINIC_OR_DEPARTMENT_OTHER)
Admission: RE | Admit: 2013-02-07 | Discharge: 2013-02-07 | Disposition: A | Discharge status: Home / Self Care | Payer: BC Managed Care – PPO | Source: Ambulatory Visit | Attending: Plastic Surgery | Admitting: Plastic Surgery

## 2013-02-07 ENCOUNTER — Telehealth: Payer: Self-pay | Admitting: Pharmacist

## 2013-02-07 ENCOUNTER — Encounter (HOSPITAL_BASED_OUTPATIENT_CLINIC_OR_DEPARTMENT_OTHER): Payer: Self-pay | Admitting: Anesthesiology

## 2013-02-07 DIAGNOSIS — Z7901 Long term (current) use of anticoagulants: Secondary | ICD-10-CM | POA: Insufficient documentation

## 2013-02-07 DIAGNOSIS — Z87828 Personal history of other (healed) physical injury and trauma: Secondary | ICD-10-CM | POA: Insufficient documentation

## 2013-02-07 DIAGNOSIS — L97309 Non-pressure chronic ulcer of unspecified ankle with unspecified severity: Secondary | ICD-10-CM | POA: Insufficient documentation

## 2013-02-07 DIAGNOSIS — L97912 Non-pressure chronic ulcer of unspecified part of right lower leg with fat layer exposed: Secondary | ICD-10-CM

## 2013-02-07 HISTORY — DX: Long term (current) use of anticoagulants: Z79.01

## 2013-02-07 HISTORY — DX: Pulmonary hypertension, unspecified: I27.20

## 2013-02-07 HISTORY — DX: Personal history of other endocrine, nutritional and metabolic disease: Z86.39

## 2013-02-07 HISTORY — DX: Hyperlipidemia, unspecified: E78.5

## 2013-02-07 HISTORY — PX: I & D EXTREMITY: SHX5045

## 2013-02-07 HISTORY — DX: Chronic atrial fibrillation, unspecified: I48.20

## 2013-02-07 HISTORY — DX: Presence of prosthetic heart valve: Z95.2

## 2013-02-07 LAB — POCT I-STAT 4, (NA,K, GLUC, HGB,HCT)
HCT: 39 % (ref 36.0–46.0)
Hemoglobin: 13.3 g/dL (ref 12.0–15.0)
Sodium: 140 mEq/L (ref 135–145)

## 2013-02-07 SURGERY — IRRIGATION AND DEBRIDEMENT EXTREMITY
Anesthesia: General | Site: Leg Lower | Laterality: Right | Wound class: Clean

## 2013-02-07 MED ORDER — KETOROLAC TROMETHAMINE 30 MG/ML IJ SOLN
INTRAMUSCULAR | Status: DC | PRN
  Administered 2013-02-07: 30 mg via INTRAVENOUS

## 2013-02-07 MED ORDER — DEXAMETHASONE SODIUM PHOSPHATE 4 MG/ML IJ SOLN
INTRAMUSCULAR | Status: DC | PRN
  Administered 2013-02-07: 10 mg via INTRAVENOUS

## 2013-02-07 MED ORDER — SODIUM CHLORIDE 0.9 % IR SOLN
Status: DC | PRN
  Administered 2013-02-07: 12:00:00

## 2013-02-07 MED ORDER — FENTANYL CITRATE 0.05 MG/ML IJ SOLN
INTRAMUSCULAR | Status: DC | PRN
  Administered 2013-02-07 (×2): 25 ug via INTRAVENOUS
  Administered 2013-02-07: 50 ug via INTRAVENOUS
  Administered 2013-02-07 (×2): 25 ug via INTRAVENOUS

## 2013-02-07 MED ORDER — MIDAZOLAM HCL 5 MG/5ML IJ SOLN
INTRAMUSCULAR | Status: DC | PRN
  Administered 2013-02-07 (×2): 1 mg via INTRAVENOUS

## 2013-02-07 MED ORDER — CEFAZOLIN SODIUM-DEXTROSE 2-3 GM-% IV SOLR
INTRAVENOUS | Status: DC | PRN
  Administered 2013-02-07: 2 g via INTRAVENOUS

## 2013-02-07 MED ORDER — LACTATED RINGERS IV SOLN
INTRAVENOUS | Status: DC | PRN
  Administered 2013-02-07 (×2): via INTRAVENOUS

## 2013-02-07 MED ORDER — LIDOCAINE HCL (CARDIAC) 20 MG/ML IV SOLN
INTRAVENOUS | Status: DC | PRN
  Administered 2013-02-07: 100 mg via INTRAVENOUS

## 2013-02-07 MED ORDER — ONDANSETRON HCL 4 MG/2ML IJ SOLN
INTRAMUSCULAR | Status: DC | PRN
  Administered 2013-02-07: 4 mg via INTRAVENOUS

## 2013-02-07 MED ORDER — PROPOFOL 10 MG/ML IV BOLUS
INTRAVENOUS | Status: DC | PRN
  Administered 2013-02-07: 200 mg via INTRAVENOUS

## 2013-02-07 MED ORDER — CEFAZOLIN SODIUM-DEXTROSE 2-3 GM-% IV SOLR
2.0000 g | INTRAVENOUS | Status: DC
  Filled 2013-02-07: qty 50

## 2013-02-07 MED ORDER — LACTATED RINGERS IV SOLN
INTRAVENOUS | Status: DC
  Administered 2013-02-07: 11:00:00 via INTRAVENOUS
  Filled 2013-02-07: qty 1000

## 2013-02-07 SURGICAL SUPPLY — 86 items
BAG DECANTER FOR FLEXI CONT (MISCELLANEOUS) IMPLANT
BANDAGE ELASTIC 3 VELCRO ST LF (GAUZE/BANDAGES/DRESSINGS) IMPLANT
BANDAGE ELASTIC 4 VELCRO ST LF (GAUZE/BANDAGES/DRESSINGS) ×2 IMPLANT
BANDAGE ELASTIC 6 VELCRO ST LF (GAUZE/BANDAGES/DRESSINGS) IMPLANT
BANDAGE GAUZE ELAST BULKY 4 IN (GAUZE/BANDAGES/DRESSINGS) ×2 IMPLANT
BENZOIN TINCTURE PRP APPL 2/3 (GAUZE/BANDAGES/DRESSINGS) IMPLANT
BLADE MINI RND TIP GREEN BEAV (BLADE) IMPLANT
BLADE SURG 10 STRL SS (BLADE) IMPLANT
BLADE SURG 15 STRL LF DISP TIS (BLADE) ×1 IMPLANT
BLADE SURG 15 STRL SS (BLADE) ×1
BNDG COHESIVE 1X5 TAN STRL LF (GAUZE/BANDAGES/DRESSINGS) IMPLANT
BNDG COHESIVE 4X5 TAN STRL (GAUZE/BANDAGES/DRESSINGS) IMPLANT
BNDG ESMARK 4X9 LF (GAUZE/BANDAGES/DRESSINGS) IMPLANT
CANISTER SUCT LVC 12 LTR MEDI- (MISCELLANEOUS) IMPLANT
CANISTER SUCTION 1200CC (MISCELLANEOUS) IMPLANT
CANISTER SUCTION 2500CC (MISCELLANEOUS) ×2 IMPLANT
CHLORAPREP W/TINT 26ML (MISCELLANEOUS) IMPLANT
CLOTH BEACON ORANGE TIMEOUT ST (SAFETY) ×2 IMPLANT
CORDS BIPOLAR (ELECTRODE) IMPLANT
COVER MAYO STAND STRL (DRAPES) ×2 IMPLANT
COVER TABLE BACK 60X90 (DRAPES) ×2 IMPLANT
DECANTER SPIKE VIAL GLASS SM (MISCELLANEOUS) IMPLANT
DRAIN PENROSE 18X1/2 LTX STRL (DRAIN) ×2 IMPLANT
DRAPE EXTREMITY T 121X128X90 (DRAPE) ×2 IMPLANT
DRAPE INCISE IOBAN 66X45 STRL (DRAPES) IMPLANT
DRAPE LG THREE QUARTER DISP (DRAPES) IMPLANT
DRSG EMULSION OIL 3X3 NADH (GAUZE/BANDAGES/DRESSINGS) IMPLANT
DRSG PAD ABDOMINAL 8X10 ST (GAUZE/BANDAGES/DRESSINGS) IMPLANT
ELECT NEEDLE BLADE 2-5/6 (NEEDLE) IMPLANT
ELECT NEEDLE TIP 2.8 STRL (NEEDLE) IMPLANT
ELECT REM PT RETURN 9FT ADLT (ELECTROSURGICAL) ×2
ELECTRODE REM PT RTRN 9FT ADLT (ELECTROSURGICAL) ×1 IMPLANT
GAUZE SPONGE 4X4 12PLY STRL LF (GAUZE/BANDAGES/DRESSINGS) ×2 IMPLANT
GAUZE XEROFORM 1X8 LF (GAUZE/BANDAGES/DRESSINGS) IMPLANT
GAUZE XEROFORM 5X9 LF (GAUZE/BANDAGES/DRESSINGS) IMPLANT
GLOVE BIO SURGEON STRL SZ 6.5 (GLOVE) ×4 IMPLANT
GOWN PREVENTION PLUS XLARGE (GOWN DISPOSABLE) IMPLANT
HANDPIECE INTERPULSE COAX TIP (DISPOSABLE)
IV NS IRRIG 3000ML ARTHROMATIC (IV SOLUTION) IMPLANT
MATRISTERN SURG MATRIX RS 7X10 (Tissue) ×2 IMPLANT
MICROMATRIX 500MG (Tissue) ×2 IMPLANT
NEEDLE 27GAX1X1/2 (NEEDLE) IMPLANT
NEEDLE HYPO 30GX1 BEV (NEEDLE) IMPLANT
NS IRRIG 1000ML POUR BTL (IV SOLUTION) ×2 IMPLANT
PACK BASIN DAY SURGERY FS (CUSTOM PROCEDURE TRAY) ×2 IMPLANT
PADDING CAST ABS 3INX4YD NS (CAST SUPPLIES)
PADDING CAST ABS 4INX4YD NS (CAST SUPPLIES)
PADDING CAST ABS COTTON 3X4 (CAST SUPPLIES) IMPLANT
PADDING CAST ABS COTTON 4X4 ST (CAST SUPPLIES) IMPLANT
PENCIL BUTTON HOLSTER BLD 10FT (ELECTRODE) IMPLANT
SET HNDPC FAN SPRY TIP SCT (DISPOSABLE) IMPLANT
SLEEVE SCD COMPRESS KNEE MED (MISCELLANEOUS) IMPLANT
SOLUTION PARTIC MCRMTRX 500MG (Tissue) ×1 IMPLANT
SPLINT PLASTER CAST XFAST 3X15 (CAST SUPPLIES) IMPLANT
SPLINT PLASTER XTRA FASTSET 3X (CAST SUPPLIES)
SPONGE GAUZE 4X4 12PLY (GAUZE/BANDAGES/DRESSINGS) ×2 IMPLANT
SPONGE LAP 18X18 X RAY DECT (DISPOSABLE) IMPLANT
SPONGE LAP 4X18 X RAY DECT (DISPOSABLE) ×2 IMPLANT
STAPLER VISISTAT 35W (STAPLE) IMPLANT
STOCKINETTE 4X48 STRL (DRAPES) IMPLANT
STOCKINETTE 6  STRL (DRAPES) ×1
STOCKINETTE 6 STRL (DRAPES) ×1 IMPLANT
STOCKINETTE IMPERVIOUS LG (DRAPES) IMPLANT
STRIP CLOSURE SKIN 1/2X4 (GAUZE/BANDAGES/DRESSINGS) IMPLANT
SUCTION FRAZIER TIP 10 FR DISP (SUCTIONS) ×2 IMPLANT
SURGILUBE 2OZ TUBE FLIPTOP (MISCELLANEOUS) IMPLANT
SUT ETHILON 3 0 PS 1 (SUTURE) IMPLANT
SUT ETHILON 4 0 P 3 18 (SUTURE) IMPLANT
SUT ETHILON 5 0 PS 2 18 (SUTURE) ×4 IMPLANT
SUT PROLENE 3 0 PS 2 (SUTURE) IMPLANT
SUT SILK 3 0 PS 1 (SUTURE) IMPLANT
SUT VIC AB 3-0 FS2 27 (SUTURE) IMPLANT
SUT VIC AB 5-0 P-3 18X BRD (SUTURE) IMPLANT
SUT VIC AB 5-0 P3 18 (SUTURE)
SUT VIC AB 5-0 PS2 18 (SUTURE) ×2 IMPLANT
SYR BULB IRRIGATION 50ML (SYRINGE) ×2 IMPLANT
SYR CONTROL 10ML LL (SYRINGE) ×2 IMPLANT
TAPE HYPAFIX 6X30 (GAUZE/BANDAGES/DRESSINGS) IMPLANT
TIP FLEX 45CM EVICEL (HEMOSTASIS) IMPLANT
TIP RIGID 35CM EVICEL (HEMOSTASIS) IMPLANT
TOWEL OR 17X24 6PK STRL BLUE (TOWEL DISPOSABLE) ×2 IMPLANT
TRAY DSU PREP LF (CUSTOM PROCEDURE TRAY) IMPLANT
TUBE CONNECTING 12X1/4 (SUCTIONS) IMPLANT
UNDERPAD 30X30 INCONTINENT (UNDERPADS AND DIAPERS) ×2 IMPLANT
WATER STERILE IRR 1000ML POUR (IV SOLUTION) ×2 IMPLANT
YANKAUER SUCT BULB TIP NO VENT (SUCTIONS) ×2 IMPLANT

## 2013-02-07 NOTE — H&P (View-Only) (Signed)
  This document contains confidential information from a [facility] medical record system and may be unauthenticated. Release may be made only with a valid authorization or in accordance with applicable policies of Medical Center or its affiliates. This document must be maintained in a secure manner or discarded/destroyed as required by Medical Center policy or by a confidential means such as shredding.   [name]  01/28/2013 9:00 AM   Initial consult  MRN:  [id number]  Provider: Claire Sanger, DO  Department: [facility]  Dept Phone: [phone]   Diagnoses    Ankle ulcer, right, with fat layer exposed (HCC)    -  Primary   707.13     Reason for Visit   right Leg Ulcer   Skin Problem     Vitals - Last Recorded    143/85  95  98.4 F (36.9 C) (Oral)  20  1.6 m (5' 3")  77.111 kg (170 lb)        BMI SpO2    30.12 kg/m2  95%             Subjective:     Patient ID: [name] is a 57 y.o. female.  HPI The patient is a 57 yrs old wf here for evaluation and treatment of her right ankle ulcer.  In May she fell down the stairs while walking a dog. She was seen in the emergency room and diagnosed with bilateral ankle fractures. The right side was open, oosing, and tenting the skin.  She was taken to the OR and underwent ORIF of the right ankle fracture.  She is now in a boot and has a nonhealing ulcer over the site.  She has not responded to conservative management.  The area is 4.5 x 4.5 cm raised and hypergranulated.  She is on coumadin and is going to work with her PCP on bridging her anticoagulation so we can I and D the area.  The following portions of the patient's history were reviewed and updated as appropriate: allergies, current medications, past family history, past medical history, past social history, past surgical history and problem list.  Review of Systems  Constitutional: Negative.   HENT: Negative.   Eyes: Negative.   Respiratory:  Negative.   Cardiovascular: Negative.   Gastrointestinal: Negative.   Endocrine: Negative.   Hematological: Negative.   Psychiatric/Behavioral: Negative.        Objective:    Physical Exam  Constitutional: She appears well-developed and well-nourished.  HENT:   Head: Normocephalic and atraumatic.  Eyes: Pupils are equal, round, and reactive to light.  Cardiovascular: Normal rate.   Pulmonary/Chest: Effort normal.  Musculoskeletal:       Legs: Neurological: She is alert.  Skin: Skin is warm.  Psychiatric: She has a normal mood and affect. Her behavior is normal. Judgment and thought content normal.      Assessment:   1.  Ankle ulcer, right, with fat layer exposed (HCC)       Plan:     Plan for I and D with Acell and VAC placement. Risks and complications were reviewed.      

## 2013-02-07 NOTE — Telephone Encounter (Signed)
Left message to call back  

## 2013-02-07 NOTE — Anesthesia Preprocedure Evaluation (Addendum)
Anesthesia Evaluation  Patient identified by MRN, date of birth, ID band Patient awake    Reviewed: Allergy & Precautions, H&P , NPO status , Patient's Chart, lab work & pertinent test results  Airway Mallampati: II TM Distance: >3 FB Neck ROM: Full    Dental no notable dental hx.    Pulmonary neg pulmonary ROS,  breath sounds clear to auscultation  Pulmonary exam normal       Cardiovascular hypertension, Pt. on medications + dysrhythmias Atrial Fibrillation + Valvular Problems/Murmurs Rhythm:Regular Rate:Normal   S/P MVR (mitral valve replacement)   1997  [facility] [place]U[facility]--  SECONDARY TO SEVERE MV STENOSIS    Neuro/Psych negative neurological ROS  negative psych ROS   GI/Hepatic negative GI ROS, Neg liver ROS,   Endo/Other  Hyperthyroidism   Renal/GU negative Renal ROS  negative genitourinary   Musculoskeletal negative musculoskeletal ROS (+)   Abdominal   Peds negative pediatric ROS (+)  Hematology negative hematology ROS (+)   Anesthesia Other Findings   Reproductive/Obstetrics negative OB ROS                           Anesthesia Physical Anesthesia Plan  ASA: III  Anesthesia Plan: General   Post-op Pain Management:    Induction: Intravenous  Airway Management Planned: LMA  Additional Equipment:   Intra-op Plan:   Post-operative Plan:   Informed Consent: I have reviewed the patients History and Physical, chart, labs and discussed the procedure including the risks, benefits and alternatives for the proposed anesthesia with the patient or authorized representative who has indicated his/her understanding and acceptance.   Dental advisory given  Plan Discussed with: CRNA and Surgeon  Anesthesia Plan Comments:         Anesthesia Quick Evaluation

## 2013-02-07 NOTE — Brief Op Note (Signed)
02/07/2013  12:33 PM  PATIENT:  [name]  57 y.o. female  PRE-OPERATIVE DIAGNOSIS:  right lower leg ulcer  POST-OPERATIVE DIAGNOSIS:  right lower leg ulcer  PROCEDURE:  Procedure(s): IRRIGATION AND DEBRIDEMENT OF RIGHT LOWER LEG WITH PLACEMENT OF ACELL AND WOULND VAC (Right)  SURGEON:  Surgeon(s) and Role:    * Claire Sanger, DO - Primary  PHYSICIAN ASSISTANT: none  ASSISTANTS: none   ANESTHESIA:   local and general  EBL:  Total I/O In: 1000 [I.V.:1000] Out: -   BLOOD ADMINISTERED:none  DRAINS: none   LOCAL MEDICATIONS USED:  MARCAINE     SPECIMEN:  No Specimen  DISPOSITION OF SPECIMEN:  N/A  COUNTS:  YES  TOURNIQUET:  * No tourniquets in log *  DICTATION: .Dragon Dictation  PLAN OF CARE: Discharge to home after PACU  PATIENT DISPOSITION:  PACU - hemodynamically stable.   Delay start of Pharmacological VTE agent (>24hrs) due to surgical blood loss or risk of bleeding: no

## 2013-02-07 NOTE — Interval H&P Note (Signed)
History and Physical Interval Note:  02/07/2013 7:30 AM  Concepcion Elk  has presented today for surgery, with the diagnosis of right lower leg ulcer  The various methods of treatment have been discussed with the patient and family. After consideration of risks, benefits and other options for treatment, the patient has consented to  Procedure(s): IRRIGATION AND DEBRIDEMENT OF RIGHT LOWER LEG WITH PLACEMENT OF ACELL AND WOULND VAC (Right) as a surgical intervention .  The patient's history has been reviewed, patient examined, no change in status, stable for surgery.  I have reviewed the patient's chart and labs.  Questions were answered to the patient's satisfaction.     SANGER,CLAIRE

## 2013-02-07 NOTE — Anesthesia Procedure Notes (Signed)
Procedure Name: LMA Insertion Date/Time: 02/07/2013 12:06 PM Performed by: Jessica Priest Pre-anesthesia Checklist: Patient identified, Emergency Drugs available, Suction available and Patient being monitored Patient Re-evaluated:Patient Re-evaluated prior to inductionOxygen Delivery Method: Circle System Utilized Preoxygenation: Pre-oxygenation with 100% oxygen Intubation Type: IV induction Ventilation: Mask ventilation without difficulty LMA: LMA inserted LMA Size: 4.0 Number of attempts: 1 Airway Equipment and Method: bite block Placement Confirmation: positive ETCO2 Tube secured with: Tape Dental Injury: Teeth and Oropharynx as per pre-operative assessment

## 2013-02-07 NOTE — Op Note (Signed)
Operative Note   SURGICAL DIVISION: Plastic Surgery  PREOPERATIVE DIAGNOSES:  Right lower leg ulceration  POSTOPERATIVE DIAGNOSES:  same  PROCEDURE:  Irrigation and debridement of right lower leg ulcer for purpose of preparation for Acell placement (powder and sheet)  SURGEON: Alan Ripper Sanger, DO  ASSISTANT: none  ANESTHESIA:  General.   COMPLICATIONS: None.   INDICATIONS FOR PROCEDURE:  57 yrs old wf with chronic right leg ulcer.   CONSENT:  Informed consent was obtained directly from the patient. Risks, benefits and alternatives were fully discussed. Specific risks including but not limited to bleeding, infection, hematoma, seroma, scarring, pain, implant infection, implant extrusion, capsular contracture, asymmetry, wound healing problems, and need for further surgery were all discussed. The patient did have an ample opportunity to have her questions answered to her satisfaction.   DESCRIPTION OF PROCEDURE:  The patient was taken to the operating room. SCDs were placed and IV antibiotics were given. The patient's operative site was prepped and draped in a sterile fashion. A time out was performed and all information was confirmed to be correct. The #15 blade was used to debride the ulcer to healthy bleeding tissue.  Hemostasis was achieved with electrocautery.  The Acell powder and sheet were applied and secured with 5-0 Vicryl.  The adaptic and surgical gel were placed with 4 x 4 gauze, kerlex and an ACE wrap. The patient was allowed to wake from anesthesia, extubated and taken to the recovery room in satisfactory condition.

## 2013-02-07 NOTE — Anesthesia Postprocedure Evaluation (Signed)
Anesthesia Post Note  Patient: [name]  Procedure(s) Performed: Procedure(s) (LRB): IRRIGATION AND DEBRIDEMENT OF RIGHT LOWER LEG WITH PLACEMENT OF ACELL AND WOULND VAC (Right)  Anesthesia type: General  Patient location: PACU  Post pain: Pain level controlled  Post assessment: Post-op Vital signs reviewed  Last Vitals:  Filed Vitals:   02/07/13 1315  BP: 116/60  Pulse: 77  Temp:   Resp: 14    Post vital signs: Reviewed  Level of consciousness: sedated  Complications: No apparent anesthesia complications

## 2013-02-07 NOTE — Transfer of Care (Signed)
Immediate Anesthesia Transfer of Care Note  Patient: [name]  Procedure(s) Performed: Procedure(s) (LRB): IRRIGATION AND DEBRIDEMENT OF RIGHT LOWER LEG WITH PLACEMENT OF ACELL AND WOULND VAC (Right)  Patient Location: PACU  Anesthesia Type: General  Level of Consciousness: awake, sedated, patient cooperative and responds to stimulation  Airway & Oxygen Therapy: Patient Spontanous Breathing and Patient connected to face mask oxygen  Post-op Assessment: Report given to PACU RN, Post -op Vital signs reviewed and stable and Patient moving all extremities  Post vital signs: Reviewed and stable  Complications: No apparent anesthesia complications

## 2013-02-07 NOTE — Telephone Encounter (Signed)
New problem    Pt wants to know what her coumadin dosage should be

## 2013-02-08 ENCOUNTER — Encounter (HOSPITAL_BASED_OUTPATIENT_CLINIC_OR_DEPARTMENT_OTHER): Payer: Self-pay | Admitting: Plastic Surgery

## 2013-02-08 NOTE — Telephone Encounter (Signed)
Pt states she was unclear on what to do yesterday with her coumadin dose, thus she only took 1/2 tablet yesterday. Instructed her to take 2 tablets today, and 1.5 tablets tomorrow then normal dose and continue her Lovenox BID until her appt on Friday.

## 2013-02-08 NOTE — Telephone Encounter (Signed)
F/u  Pt following up on return call regarding coumadin dosage

## 2013-02-11 ENCOUNTER — Ambulatory Visit (INDEPENDENT_AMBULATORY_CARE_PROVIDER_SITE_OTHER): Payer: BC Managed Care – PPO | Admitting: *Deleted

## 2013-02-11 DIAGNOSIS — I059 Rheumatic mitral valve disease, unspecified: Secondary | ICD-10-CM

## 2013-02-11 DIAGNOSIS — Z9889 Other specified postprocedural states: Secondary | ICD-10-CM

## 2013-02-11 DIAGNOSIS — I4891 Unspecified atrial fibrillation: Secondary | ICD-10-CM

## 2013-02-11 DIAGNOSIS — I482 Chronic atrial fibrillation, unspecified: Secondary | ICD-10-CM

## 2013-02-11 LAB — POCT INR: INR: 1.4

## 2013-02-14 ENCOUNTER — Ambulatory Visit (INDEPENDENT_AMBULATORY_CARE_PROVIDER_SITE_OTHER): Payer: BC Managed Care – PPO | Admitting: *Deleted

## 2013-02-14 DIAGNOSIS — I482 Chronic atrial fibrillation, unspecified: Secondary | ICD-10-CM

## 2013-02-14 DIAGNOSIS — I059 Rheumatic mitral valve disease, unspecified: Secondary | ICD-10-CM

## 2013-02-14 DIAGNOSIS — I4891 Unspecified atrial fibrillation: Secondary | ICD-10-CM

## 2013-02-14 DIAGNOSIS — Z9889 Other specified postprocedural states: Secondary | ICD-10-CM

## 2013-02-14 LAB — POCT INR: INR: 1.8

## 2013-02-14 NOTE — Progress Notes (Signed)
[facility]  NAME:  SHEARON, CLONCH                   ACCOUNT NO.:  MEDICAL RECORD NO.:  [id number]      DATE OF BIRTH:  07/14/1955  PHYSICIAN:  Wayland Denis, DO       VISIT DATE:  02/14/2013                                  OFFICE VISIT   The patient is a 57 year old female who is here for a followup on her right ankle chronic ulcer secondary to trauma.  She underwent irrigation and debridement and ACell placement last week, and is doing okay at the moment.  She has a little bit of periwound breakdown and redness.  The ACell dried out a little bit, but still intact so we will continue with Hydrogel to be placed every other day and we will see her back in 1 week.     Wayland Denis, DO     CS/MEDQ  D:  02/14/2013  T:  02/14/2013  Job:  [id number]

## 2013-02-22 ENCOUNTER — Encounter (HOSPITAL_BASED_OUTPATIENT_CLINIC_OR_DEPARTMENT_OTHER): Payer: BC Managed Care – PPO | Attending: Plastic Surgery

## 2013-02-22 ENCOUNTER — Ambulatory Visit (INDEPENDENT_AMBULATORY_CARE_PROVIDER_SITE_OTHER): Payer: BC Managed Care – PPO | Admitting: *Deleted

## 2013-02-22 DIAGNOSIS — I059 Rheumatic mitral valve disease, unspecified: Secondary | ICD-10-CM

## 2013-02-22 DIAGNOSIS — I4891 Unspecified atrial fibrillation: Secondary | ICD-10-CM

## 2013-02-22 DIAGNOSIS — L97309 Non-pressure chronic ulcer of unspecified ankle with unspecified severity: Secondary | ICD-10-CM | POA: Insufficient documentation

## 2013-02-22 DIAGNOSIS — I482 Chronic atrial fibrillation, unspecified: Secondary | ICD-10-CM

## 2013-02-22 DIAGNOSIS — Z9889 Other specified postprocedural states: Secondary | ICD-10-CM

## 2013-02-22 LAB — POCT INR: INR: 4.5

## 2013-02-28 NOTE — Progress Notes (Signed)
[facility]  NAME:  TEANDRA, HARLAN              ACCOUNT NO.:  [id number]  MEDICAL RECORD NO.:  [id number]      DATE OF BIRTH:  1955-08-06  PHYSICIAN:  Wayland Denis, DO       VISIT DATE:  02/28/2013                                  OFFICE VISIT   The patient is a 57 year old female who is here for evaluation and follow up from her right lower extremity traumatic ulcer, now chronic. She underwent irrigation and debridement, ACell placement in the OR. She is doing much better.  The hypergranulation has resolved and she is granulating in.  It is still slow.  She states she has been taking the protein supplements and her multivitamins daily.  On exam, she is alert and oriented, cooperative, not in any distress. She is pleasant.  The wound is described above.  There has been no change in medications or social history.  We will continue with dressing changes, but we will do collagen and apply for Oasis and Apligraf, and see if we can finish off the epithelialization.     Wayland Denis, DO     CS/MEDQ  D:  02/28/2013  T:  02/28/2013  Job:  [id number]

## 2013-03-07 ENCOUNTER — Ambulatory Visit (INDEPENDENT_AMBULATORY_CARE_PROVIDER_SITE_OTHER): Payer: BC Managed Care – PPO | Admitting: Pharmacist

## 2013-03-07 DIAGNOSIS — Z9889 Other specified postprocedural states: Secondary | ICD-10-CM

## 2013-03-07 DIAGNOSIS — I4891 Unspecified atrial fibrillation: Secondary | ICD-10-CM

## 2013-03-07 DIAGNOSIS — I059 Rheumatic mitral valve disease, unspecified: Secondary | ICD-10-CM

## 2013-03-07 DIAGNOSIS — I482 Chronic atrial fibrillation, unspecified: Secondary | ICD-10-CM

## 2013-03-07 NOTE — Progress Notes (Signed)
[facility]  NAME:  SHALVA, ROZYCKI              ACCOUNT NO.:  [id number]  MEDICAL RECORD NO.:  [id number]      DATE OF BIRTH:  Dec 16, 1955  PHYSICIAN:  Wayland Denis, DO       VISIT DATE:  03/07/2013                                  OFFICE VISIT   The patient is a 57 year old female who is here for followup on her right lower extremity ankle ulcer secondary to trauma.  She underwent debridement with ACell placement in the OR.  The area is looking much better.  The tissue is much healthier, more granulating, and less hypertrophic.  There has been no change in her medications or social history.  She used Silvercel over the past week with some improvement in the periwound area as well as in the overall size and depth.  This week, we placed ACell, and she is to change it every other day with KY jelly, and we will see her back in a week.     Wayland Denis, DO     CS/MEDQ  D:  03/07/2013  T:  03/07/2013  Job:  [id number]

## 2013-03-15 NOTE — Progress Notes (Signed)
[facility]  NAME:  COTI, BURD              ACCOUNT NO.:  [id number]  MEDICAL RECORD NO.:  [id number]      DATE OF BIRTH:  03-Mar-1956  PHYSICIAN:  Wayland Denis, DO       VISIT DATE:  03/14/2013                                  OFFICE VISIT   The patient is a 57 year old female who is here for followup on her right ankle chronic ulcer from trauma.  She underwent irrigation and debridement, ACell placement in the OR.  She is showing signs of improvement with a decrease in the overall size.  Today, she is showing some hypergranulation tissue again and a little irritation from a retained Vicryl so this was removed and silver nitrate was used with some Hydrogel and she is to do that every other day and we will see her back in 1 week.  In the meantime, she is to continue with multivitamin, vitamin C, zinc, elevation, and increasing her protein.  She has several lesions on her bilateral lower extremities, and all of these are extremely slow to heal.  So there is an underlying issue although this has not yet been identified.  Her vascular status is adequate, but overall we have encouraged her to continue healthy eating.     Wayland Denis, DO     CS/MEDQ  D:  03/14/2013  T:  03/15/2013  Job:  [id number]

## 2013-03-28 ENCOUNTER — Encounter (HOSPITAL_BASED_OUTPATIENT_CLINIC_OR_DEPARTMENT_OTHER): Payer: BC Managed Care – PPO | Attending: Plastic Surgery

## 2013-03-28 ENCOUNTER — Ambulatory Visit (INDEPENDENT_AMBULATORY_CARE_PROVIDER_SITE_OTHER): Payer: BC Managed Care – PPO | Admitting: *Deleted

## 2013-03-28 DIAGNOSIS — Z9889 Other specified postprocedural states: Secondary | ICD-10-CM

## 2013-03-28 DIAGNOSIS — I059 Rheumatic mitral valve disease, unspecified: Secondary | ICD-10-CM

## 2013-03-28 DIAGNOSIS — I482 Chronic atrial fibrillation, unspecified: Secondary | ICD-10-CM

## 2013-03-28 DIAGNOSIS — L97309 Non-pressure chronic ulcer of unspecified ankle with unspecified severity: Secondary | ICD-10-CM | POA: Insufficient documentation

## 2013-03-28 DIAGNOSIS — I4891 Unspecified atrial fibrillation: Secondary | ICD-10-CM

## 2013-03-28 NOTE — Progress Notes (Signed)
[facility]  NAME:  [name], LANDRY              ACCOUNT NO.:  [id number]  MEDICAL RECORD NO.:  [id number]      DATE OF BIRTH:  [birth year]  PHYSICIAN:  Wayland Denis, DO       VISIT DATE:  03/28/2013                                  OFFICE VISIT   The patient is a 57 year old female who is here for followup on her right ankle ulcer.  She underwent debridement with ACell placement, and stalled out for a while.  She is now showing some very good signs of improvement and healing.  There is still a little bit of hypergranulation tissue.  Silver nitrate was used on this.  There has been no change in her medications or social history.  PHYSICAL EXAMINATION:  On exam, she is alert, oriented, cooperative, not in any acute distress.  She is very pleasant, very encouraged about her progress.  We will continue with the sore VAC and plan to see her back in a week.     Wayland Denis, DO     CS/MEDQ  D:  03/28/2013  T:  03/28/2013  Job:  [id number]

## 2013-04-11 ENCOUNTER — Ambulatory Visit (INDEPENDENT_AMBULATORY_CARE_PROVIDER_SITE_OTHER): Payer: BC Managed Care – PPO | Admitting: *Deleted

## 2013-04-11 DIAGNOSIS — I059 Rheumatic mitral valve disease, unspecified: Secondary | ICD-10-CM

## 2013-04-11 DIAGNOSIS — Z9889 Other specified postprocedural states: Secondary | ICD-10-CM

## 2013-04-11 DIAGNOSIS — I482 Chronic atrial fibrillation, unspecified: Secondary | ICD-10-CM

## 2013-04-11 DIAGNOSIS — I4891 Unspecified atrial fibrillation: Secondary | ICD-10-CM

## 2013-04-11 NOTE — Progress Notes (Signed)
[facility]  NAME:  [name], PATCHEN              ACCOUNT NO.:  [id number]  MEDICAL RECORD NO.:  [id number]      DATE OF BIRTH:  1955-08-30  PHYSICIAN:  Wayland Denis, DO       VISIT DATE:  04/11/2013                                  OFFICE VISIT   The patient is a 57 year old female who is here for a followup on her right lower extremity ankle ulcer.  She is doing extremely well and has completely healed the area.  There has been no change in her medications or social history.  The area has completely healed.  Recommended continuing with multivitamins, vitamin C, zinc and the diabetic sock. She did have 1 on today, but what was labeled a diabetic sock, but it was probably no more than 5 mmHg pressure, so we did talk about that as well and we will see her back as needed.     Wayland Denis, DO     CS/MEDQ  D:  04/11/2013  T:  04/11/2013  Job:  [id number]

## 2013-04-25 ENCOUNTER — Ambulatory Visit (INDEPENDENT_AMBULATORY_CARE_PROVIDER_SITE_OTHER): Payer: BC Managed Care – PPO | Admitting: General Practice

## 2013-04-25 DIAGNOSIS — I482 Chronic atrial fibrillation, unspecified: Secondary | ICD-10-CM

## 2013-04-25 DIAGNOSIS — I059 Rheumatic mitral valve disease, unspecified: Secondary | ICD-10-CM

## 2013-04-25 DIAGNOSIS — Z9889 Other specified postprocedural states: Secondary | ICD-10-CM

## 2013-04-25 DIAGNOSIS — I4891 Unspecified atrial fibrillation: Secondary | ICD-10-CM

## 2013-04-25 LAB — POCT INR: INR: 4.4

## 2013-04-27 ENCOUNTER — Telehealth: Payer: Self-pay | Admitting: Cardiology

## 2013-04-27 NOTE — Telephone Encounter (Signed)
New message     Pt want coumadin clinic to know she  is on meloxicam 15mg .

## 2013-04-27 NOTE — Telephone Encounter (Signed)
Returned call to pt advised Meloxicam ok to take with Coumadin. Advised to monitor for s+s of internal bleeding, dark stools.  Advised these drugs are irritating to the stomach and increase the risks of internal bleeding.  Advised pt to take with food to help prevent stomach irritation.  Pt verbalizes understanding.

## 2013-05-09 ENCOUNTER — Encounter (INDEPENDENT_AMBULATORY_CARE_PROVIDER_SITE_OTHER): Payer: Self-pay

## 2013-05-09 ENCOUNTER — Other Ambulatory Visit: Payer: Self-pay | Admitting: Cardiology

## 2013-05-09 ENCOUNTER — Ambulatory Visit (INDEPENDENT_AMBULATORY_CARE_PROVIDER_SITE_OTHER): Payer: BC Managed Care – PPO | Admitting: *Deleted

## 2013-05-09 DIAGNOSIS — Z9889 Other specified postprocedural states: Secondary | ICD-10-CM

## 2013-05-09 DIAGNOSIS — I059 Rheumatic mitral valve disease, unspecified: Secondary | ICD-10-CM

## 2013-05-09 DIAGNOSIS — I482 Chronic atrial fibrillation, unspecified: Secondary | ICD-10-CM

## 2013-05-09 DIAGNOSIS — I4891 Unspecified atrial fibrillation: Secondary | ICD-10-CM

## 2013-05-18 ENCOUNTER — Ambulatory Visit (INDEPENDENT_AMBULATORY_CARE_PROVIDER_SITE_OTHER): Payer: BC Managed Care – PPO | Admitting: *Deleted

## 2013-05-18 DIAGNOSIS — I482 Chronic atrial fibrillation, unspecified: Secondary | ICD-10-CM

## 2013-05-18 DIAGNOSIS — I059 Rheumatic mitral valve disease, unspecified: Secondary | ICD-10-CM

## 2013-05-18 DIAGNOSIS — I4891 Unspecified atrial fibrillation: Secondary | ICD-10-CM

## 2013-05-18 DIAGNOSIS — Z9889 Other specified postprocedural states: Secondary | ICD-10-CM

## 2013-06-01 ENCOUNTER — Ambulatory Visit (INDEPENDENT_AMBULATORY_CARE_PROVIDER_SITE_OTHER): Payer: BC Managed Care – PPO | Admitting: *Deleted

## 2013-06-01 DIAGNOSIS — I4891 Unspecified atrial fibrillation: Secondary | ICD-10-CM

## 2013-06-01 DIAGNOSIS — I059 Rheumatic mitral valve disease, unspecified: Secondary | ICD-10-CM

## 2013-06-01 DIAGNOSIS — Z9889 Other specified postprocedural states: Secondary | ICD-10-CM

## 2013-06-01 DIAGNOSIS — I482 Chronic atrial fibrillation, unspecified: Secondary | ICD-10-CM

## 2013-06-01 LAB — POCT INR: INR: 3

## 2013-06-06 ENCOUNTER — Ambulatory Visit (INDEPENDENT_AMBULATORY_CARE_PROVIDER_SITE_OTHER): Payer: BC Managed Care – PPO | Admitting: Cardiology

## 2013-06-06 ENCOUNTER — Encounter: Payer: Self-pay | Admitting: Cardiology

## 2013-06-06 ENCOUNTER — Encounter: Payer: Self-pay | Admitting: *Deleted

## 2013-06-06 VITALS — BP 122/80 | HR 90 | Ht 63.0 in | Wt 167.0 lb

## 2013-06-06 DIAGNOSIS — I482 Chronic atrial fibrillation, unspecified: Secondary | ICD-10-CM

## 2013-06-06 DIAGNOSIS — I059 Rheumatic mitral valve disease, unspecified: Secondary | ICD-10-CM

## 2013-06-06 DIAGNOSIS — I4891 Unspecified atrial fibrillation: Secondary | ICD-10-CM

## 2013-06-06 MED ORDER — WARFARIN SODIUM 5 MG PO TABS: ORAL_TABLET | ORAL | Status: DC

## 2013-06-06 MED ORDER — METOPROLOL SUCCINATE ER 50 MG PO TB24: 50.0000 mg | ORAL_TABLET | Freq: Every day | ORAL | Status: DC

## 2013-06-06 NOTE — Progress Notes (Signed)
HPI: 57 year old female previously followed by Dr. Daleen Squibb for followup of atrial fibrillation and previous mitral valve replacement for rheumatic heart disease. Patient had mitral valve replacement in 1997. Cardiac catheterization in 2008 showed normal coronary arteries. Last echocardiogram in 2011 showed normal LV function, prosthetic mitral valve, biatrial enlargement, moderate tricuspid regurgitation and trace aortic insufficiency. ABIs with Doppler in July of 2014 normal. Since she was last seen, she denies dyspnea, chest pain or syncope.  Current Outpatient Prescriptions  Medication Sig Dispense Refill  . atorvastatin (LIPITOR) 40 MG tablet Take 40 mg by mouth every evening.       . digoxin (LANOXIN) 0.25 MG tablet Take 0.25 mg by mouth every morning.       . metoprolol succinate (TOPROL-XL) 25 MG 24 hr tablet       . Multiple Vitamins-Minerals (CENTRUM SILVER ADULT 50+ PO) Take 1 tablet by mouth daily.      Marland Kitchen omeprazole (PRILOSEC) 20 MG capsule Take 20 mg by mouth every morning.       . warfarin (COUMADIN) 5 MG tablet TAKE AS DIRECTED BY ANTI COAGULATION CLINIC  35 tablet  3   No current facility-administered medications for this visit.     Past Medical History  Diagnosis Date  . Rheumatic heart disease     CARDIOLOGIST-- DR Maisie Fus [facility]  . HTN (hypertension)     unspecified  . Hypercholesterolemia   . GERD (gastroesophageal reflux disease)   . Goiter     multinodular  . Anticoagulated on warfarin   . Pulmonary hypertension, mild   . Hyperlipidemia   . History of hyperthyroidism     2003--  PTU [place]  . Chronic atrial fibrillation     SINCE 1997  . S/P MVR (mitral valve replacement)     1997  [facility]--  SECONDARY TO SEVERE MV STENOSIS    Past Surgical History  Procedure Laterality Date  . Wrist arthroscopy Right 12-10-2007    debridement and ulnar shortening osteotomy w/ plate  . Carpal tunnel release Right 01-20-2007  . Ercp  08/15/2011    Procedure: ENDOSCOPIC  RETROGRADE CHOLANGIOPANCREATOGRAPHY (ERCP);  Surgeon: Petra Kuba, MD;  Location: [facility] ENDOSCOPY;  Service: Endoscopy;  Laterality: N/A;  . Orif ankle fracture Right 11/05/2012    Procedure: OPEN REDUCTION INTERNAL FIXATION (ORIF) ANKLE FRACTURE only operating on right;  Surgeon: Harvie Junior, MD;  Location: [facility] OR;  Service: Orthopedics;  Laterality: Right;  . Anterior cervical decomp/discectomy fusion  12-21-2001    C5 --  C6  . Laparoscopic cholecystectomy  04-08-2002  . Cardiac catheterization  12-28-2006  DR [facility]    NORMAL CORONARY ARTERIES  . Mitral valve replacement  1997    [facility]  . Orif right ulnar fx w/ iliac crest bone graft  10-06-2008  . Ercp      MULTIPLE--  INCLUDING STENTS, SPHINTEROTOMY'S  STONE REMOVAL   . Transthoracic echocardiogram  06-29-2009  dr wall    normal lvsf/ ef 55-60%/  normal bileaflet mechanical mvr/ moderated dilated la/ moderate tr  . I&d extremity Right 02/07/2013    Procedure: IRRIGATION AND DEBRIDEMENT OF RIGHT LOWER LEG WITH PLACEMENT OF ACELL AND WOULND VAC;  Surgeon: Wayland Denis, DO;  Location: [facility];  Service: Plastics;  Laterality: Right;    History   Social History  . Marital Status: Divorced    Spouse Name: N/A    Number of Children: N/A  . Years of Education: N/A  Occupational History  . Geologist, engineering    Social History Main Topics  . Smoking status: Never Smoker   . Smokeless tobacco: Not on file  . Alcohol Use: No  . Drug Use: No  . Sexual Activity: Not on file   Other Topics Concern  . Not on file   Social History Narrative  . No narrative on file    ROS: no fevers or chills, productive cough, hemoptysis, dysphasia, odynophagia, melena, hematochezia, dysuria, hematuria, rash, seizure activity, orthopnea, PND, pedal edema, claudication. Remaining systems are negative.  Physical Exam: Well-developed well-nourished in no acute distress.  Skin is warm and dry.  HEENT is normal.  Neck is  supple.  Chest is clear to auscultation with normal expansion.  Cardiovascular exam is irregular, crisp mechanical valve sounds Abdominal exam nontender or distended. No masses palpated. Extremities show no edema. Status post bilateral ankle fractures in may neuro grossly intact  ECG atrial fibrillation at a rate of 90. Normal axis. Diffuse T-wave changes. Left ventricular hypertrophy.

## 2013-06-06 NOTE — Assessment & Plan Note (Signed)
Continue present blood pressure medications. 

## 2013-06-06 NOTE — Patient Instructions (Signed)
Your physician wants you to follow-up in: ONE YEAR WITH DR Shelda Pal will receive a reminder letter in the mail two months in advance. If you don't receive a letter, please call our office to schedule the follow-up appointment.   Your physician has requested that you have an echocardiogram. Echocardiography is a painless test that uses sound waves to create images of your heart. It provides your doctor with information about the size and shape of your heart and how well your heart's chambers and valves are working. This procedure takes approximately one hour. There are no restrictions for this procedure.   STOP DIGOXIN  INCREASE METOPROLOL TO 50 MG ONCE DAILY  Your physician has recommended that you wear a 48 HOUR holter monitor. Holter monitors are medical devices that record the heart's electrical activity. Doctors most often use these monitors to diagnose arrhythmias. Arrhythmias are problems with the speed or rhythm of the heartbeat. The monitor is a small, portable device. You can wear one while you do your normal daily activities. This is usually used to diagnose what is causing palpitations/syncope (passing out).SCHEDULE IN 4 WEEKS

## 2013-06-06 NOTE — Assessment & Plan Note (Signed)
Patient has permanent atrial fibrillation. Continue Coumadin. Discontinue digoxin. Increase metoprolol to 50 mg daily. Check 48 hour Holter monitor in 4 weeks to make sure that rate is controlled.

## 2013-06-06 NOTE — Assessment & Plan Note (Signed)
Continue SBE prophylaxis. Repeat echocardiogram. 

## 2013-06-06 NOTE — Assessment & Plan Note (Signed)
Continue statin. 

## 2013-06-09 ENCOUNTER — Telehealth: Payer: Self-pay | Admitting: Cardiology

## 2013-06-09 NOTE — Telephone Encounter (Signed)
New message    Forgot to tell Dr Jens Som that she gets her lipids checked here.  Let her know if she needs to have them checked.

## 2013-06-09 NOTE — Telephone Encounter (Signed)
Spoke with pt, her last lipid check was 09-2012. She is not due for any lab work now. Patient voiced understanding

## 2013-06-22 ENCOUNTER — Encounter: Payer: Self-pay | Admitting: Cardiology

## 2013-06-22 ENCOUNTER — Ambulatory Visit (INDEPENDENT_AMBULATORY_CARE_PROVIDER_SITE_OTHER): Payer: BC Managed Care – PPO | Admitting: *Deleted

## 2013-06-22 ENCOUNTER — Ambulatory Visit (HOSPITAL_COMMUNITY): Payer: BC Managed Care – PPO | Attending: Cardiology | Admitting: Radiology

## 2013-06-22 DIAGNOSIS — I10 Essential (primary) hypertension: Secondary | ICD-10-CM | POA: Insufficient documentation

## 2013-06-22 DIAGNOSIS — E785 Hyperlipidemia, unspecified: Secondary | ICD-10-CM | POA: Insufficient documentation

## 2013-06-22 DIAGNOSIS — I4891 Unspecified atrial fibrillation: Secondary | ICD-10-CM

## 2013-06-22 DIAGNOSIS — Z954 Presence of other heart-valve replacement: Secondary | ICD-10-CM | POA: Insufficient documentation

## 2013-06-22 DIAGNOSIS — I079 Rheumatic tricuspid valve disease, unspecified: Secondary | ICD-10-CM | POA: Insufficient documentation

## 2013-06-22 DIAGNOSIS — I379 Nonrheumatic pulmonary valve disorder, unspecified: Secondary | ICD-10-CM | POA: Insufficient documentation

## 2013-06-22 DIAGNOSIS — I059 Rheumatic mitral valve disease, unspecified: Secondary | ICD-10-CM

## 2013-06-22 DIAGNOSIS — I359 Nonrheumatic aortic valve disorder, unspecified: Secondary | ICD-10-CM | POA: Insufficient documentation

## 2013-06-22 DIAGNOSIS — I319 Disease of pericardium, unspecified: Secondary | ICD-10-CM | POA: Insufficient documentation

## 2013-06-22 DIAGNOSIS — Z9889 Other specified postprocedural states: Secondary | ICD-10-CM

## 2013-06-22 DIAGNOSIS — I482 Chronic atrial fibrillation, unspecified: Secondary | ICD-10-CM

## 2013-06-22 LAB — POCT INR: INR: 4.2

## 2013-06-22 NOTE — Progress Notes (Signed)
Echocardiogram performed.  

## 2013-06-23 HISTORY — PX: COLONOSCOPY: SHX174

## 2013-06-29 ENCOUNTER — Telehealth: Payer: Self-pay | Admitting: Cardiology

## 2013-06-29 NOTE — Telephone Encounter (Signed)
Pt aware of echo results Horton Chin RN

## 2013-06-29 NOTE — Telephone Encounter (Signed)
Follow Up  ° °Pt returned call//SR  °

## 2013-07-04 ENCOUNTER — Encounter: Payer: Self-pay | Admitting: *Deleted

## 2013-07-04 ENCOUNTER — Encounter (INDEPENDENT_AMBULATORY_CARE_PROVIDER_SITE_OTHER): Payer: BC Managed Care – PPO

## 2013-07-04 ENCOUNTER — Ambulatory Visit (INDEPENDENT_AMBULATORY_CARE_PROVIDER_SITE_OTHER): Payer: BC Managed Care – PPO | Admitting: *Deleted

## 2013-07-04 DIAGNOSIS — I4891 Unspecified atrial fibrillation: Secondary | ICD-10-CM

## 2013-07-04 DIAGNOSIS — I059 Rheumatic mitral valve disease, unspecified: Secondary | ICD-10-CM

## 2013-07-04 DIAGNOSIS — Z9889 Other specified postprocedural states: Secondary | ICD-10-CM

## 2013-07-04 DIAGNOSIS — I482 Chronic atrial fibrillation, unspecified: Secondary | ICD-10-CM

## 2013-07-04 LAB — POCT INR: INR: 3.8

## 2013-07-04 NOTE — Progress Notes (Signed)
Patient ID: [name], female   DOB: 06-25-1955, 58 y.o.   MRN: [id number] E-Cardio 48 hour holter monitor applied to patient.

## 2013-07-14 ENCOUNTER — Telehealth: Payer: Self-pay | Admitting: *Deleted

## 2013-07-14 DIAGNOSIS — I482 Chronic atrial fibrillation, unspecified: Secondary | ICD-10-CM

## 2013-07-14 MED ORDER — METOPROLOL SUCCINATE ER 50 MG PO TB24: ORAL_TABLET | ORAL | Status: DC

## 2013-07-14 NOTE — Telephone Encounter (Signed)
Spoke with pt, aware monitor reviewed by dr Stanford Breed shows atrial fib, HR elevated. PVC's or aberrantly conducted beats Pt instructed to increase metoprolol to 75 mg once daily. Script sent to the pharm

## 2013-07-19 ENCOUNTER — Ambulatory Visit (INDEPENDENT_AMBULATORY_CARE_PROVIDER_SITE_OTHER): Payer: BC Managed Care – PPO | Admitting: Pharmacist

## 2013-07-19 DIAGNOSIS — I4891 Unspecified atrial fibrillation: Secondary | ICD-10-CM

## 2013-07-19 DIAGNOSIS — Z5181 Encounter for therapeutic drug level monitoring: Secondary | ICD-10-CM | POA: Insufficient documentation

## 2013-07-19 DIAGNOSIS — Z9889 Other specified postprocedural states: Secondary | ICD-10-CM

## 2013-07-19 DIAGNOSIS — I482 Chronic atrial fibrillation, unspecified: Secondary | ICD-10-CM

## 2013-07-19 DIAGNOSIS — I059 Rheumatic mitral valve disease, unspecified: Secondary | ICD-10-CM

## 2013-07-19 LAB — POCT INR: INR: 2.6

## 2013-08-03 ENCOUNTER — Encounter: Payer: Self-pay | Admitting: *Deleted

## 2013-08-03 ENCOUNTER — Ambulatory Visit (INDEPENDENT_AMBULATORY_CARE_PROVIDER_SITE_OTHER): Payer: BC Managed Care – PPO | Admitting: *Deleted

## 2013-08-03 DIAGNOSIS — I482 Chronic atrial fibrillation, unspecified: Secondary | ICD-10-CM

## 2013-08-03 DIAGNOSIS — I059 Rheumatic mitral valve disease, unspecified: Secondary | ICD-10-CM

## 2013-08-03 DIAGNOSIS — Z5181 Encounter for therapeutic drug level monitoring: Secondary | ICD-10-CM

## 2013-08-03 DIAGNOSIS — I4891 Unspecified atrial fibrillation: Secondary | ICD-10-CM

## 2013-08-03 DIAGNOSIS — Z9889 Other specified postprocedural states: Secondary | ICD-10-CM

## 2013-08-03 LAB — POCT INR: INR: 7

## 2013-08-03 LAB — PROTIME-INR
INR: 6.8 ratio (ref 0.8–1.0)
Prothrombin Time: 69.6 s (ref 10.2–12.4)

## 2013-08-10 ENCOUNTER — Encounter: Payer: Self-pay | Admitting: Pharmacist

## 2013-08-10 ENCOUNTER — Ambulatory Visit (INDEPENDENT_AMBULATORY_CARE_PROVIDER_SITE_OTHER): Payer: BC Managed Care – PPO | Admitting: Pharmacist

## 2013-08-10 DIAGNOSIS — I482 Chronic atrial fibrillation, unspecified: Secondary | ICD-10-CM

## 2013-08-10 DIAGNOSIS — Z5181 Encounter for therapeutic drug level monitoring: Secondary | ICD-10-CM

## 2013-08-10 DIAGNOSIS — Z9889 Other specified postprocedural states: Secondary | ICD-10-CM

## 2013-08-10 DIAGNOSIS — I4891 Unspecified atrial fibrillation: Secondary | ICD-10-CM

## 2013-08-10 DIAGNOSIS — I059 Rheumatic mitral valve disease, unspecified: Secondary | ICD-10-CM

## 2013-08-10 LAB — POCT INR: INR: 1.9

## 2013-08-17 ENCOUNTER — Encounter: Payer: Self-pay | Admitting: *Deleted

## 2013-08-17 ENCOUNTER — Ambulatory Visit (INDEPENDENT_AMBULATORY_CARE_PROVIDER_SITE_OTHER): Payer: BC Managed Care – PPO | Admitting: *Deleted

## 2013-08-17 DIAGNOSIS — I059 Rheumatic mitral valve disease, unspecified: Secondary | ICD-10-CM

## 2013-08-17 DIAGNOSIS — Z5181 Encounter for therapeutic drug level monitoring: Secondary | ICD-10-CM

## 2013-08-17 DIAGNOSIS — Z9889 Other specified postprocedural states: Secondary | ICD-10-CM

## 2013-08-17 DIAGNOSIS — I4891 Unspecified atrial fibrillation: Secondary | ICD-10-CM

## 2013-08-17 DIAGNOSIS — I482 Chronic atrial fibrillation, unspecified: Secondary | ICD-10-CM

## 2013-08-17 LAB — POCT INR: INR: 2.3

## 2013-08-26 ENCOUNTER — Ambulatory Visit (INDEPENDENT_AMBULATORY_CARE_PROVIDER_SITE_OTHER): Payer: BC Managed Care – PPO

## 2013-08-26 DIAGNOSIS — Z9889 Other specified postprocedural states: Secondary | ICD-10-CM

## 2013-08-26 DIAGNOSIS — Z5181 Encounter for therapeutic drug level monitoring: Secondary | ICD-10-CM

## 2013-08-26 DIAGNOSIS — I059 Rheumatic mitral valve disease, unspecified: Secondary | ICD-10-CM

## 2013-08-26 DIAGNOSIS — I4891 Unspecified atrial fibrillation: Secondary | ICD-10-CM

## 2013-08-26 DIAGNOSIS — I482 Chronic atrial fibrillation, unspecified: Secondary | ICD-10-CM

## 2013-08-26 LAB — POCT INR: INR: 3.9

## 2013-09-09 ENCOUNTER — Ambulatory Visit (INDEPENDENT_AMBULATORY_CARE_PROVIDER_SITE_OTHER): Payer: BC Managed Care – PPO | Admitting: *Deleted

## 2013-09-09 ENCOUNTER — Encounter: Payer: Self-pay | Admitting: *Deleted

## 2013-09-09 DIAGNOSIS — I482 Chronic atrial fibrillation, unspecified: Secondary | ICD-10-CM

## 2013-09-09 DIAGNOSIS — Z9889 Other specified postprocedural states: Secondary | ICD-10-CM

## 2013-09-09 DIAGNOSIS — I059 Rheumatic mitral valve disease, unspecified: Secondary | ICD-10-CM

## 2013-09-09 DIAGNOSIS — Z5181 Encounter for therapeutic drug level monitoring: Secondary | ICD-10-CM

## 2013-09-09 DIAGNOSIS — I4891 Unspecified atrial fibrillation: Secondary | ICD-10-CM

## 2013-09-09 LAB — POCT INR: INR: 1.9

## 2013-09-16 ENCOUNTER — Other Ambulatory Visit: Payer: Self-pay | Admitting: Cardiology

## 2013-09-19 ENCOUNTER — Encounter: Payer: Self-pay | Admitting: *Deleted

## 2013-09-19 ENCOUNTER — Ambulatory Visit (INDEPENDENT_AMBULATORY_CARE_PROVIDER_SITE_OTHER): Payer: BC Managed Care – PPO | Admitting: *Deleted

## 2013-09-19 DIAGNOSIS — Z9889 Other specified postprocedural states: Secondary | ICD-10-CM

## 2013-09-19 DIAGNOSIS — Z5181 Encounter for therapeutic drug level monitoring: Secondary | ICD-10-CM

## 2013-09-19 DIAGNOSIS — I482 Chronic atrial fibrillation, unspecified: Secondary | ICD-10-CM

## 2013-09-19 DIAGNOSIS — I059 Rheumatic mitral valve disease, unspecified: Secondary | ICD-10-CM

## 2013-09-19 DIAGNOSIS — I4891 Unspecified atrial fibrillation: Secondary | ICD-10-CM

## 2013-09-19 LAB — POCT INR: INR: 2.2

## 2013-09-29 ENCOUNTER — Ambulatory Visit (INDEPENDENT_AMBULATORY_CARE_PROVIDER_SITE_OTHER): Payer: BC Managed Care – PPO

## 2013-09-29 DIAGNOSIS — Z9889 Other specified postprocedural states: Secondary | ICD-10-CM

## 2013-09-29 DIAGNOSIS — Z5181 Encounter for therapeutic drug level monitoring: Secondary | ICD-10-CM

## 2013-09-29 DIAGNOSIS — I059 Rheumatic mitral valve disease, unspecified: Secondary | ICD-10-CM

## 2013-09-29 DIAGNOSIS — I482 Chronic atrial fibrillation, unspecified: Secondary | ICD-10-CM

## 2013-09-29 DIAGNOSIS — I4891 Unspecified atrial fibrillation: Secondary | ICD-10-CM

## 2013-09-29 LAB — POCT INR: INR: 3.8

## 2013-10-13 ENCOUNTER — Ambulatory Visit (INDEPENDENT_AMBULATORY_CARE_PROVIDER_SITE_OTHER): Payer: BC Managed Care – PPO

## 2013-10-13 DIAGNOSIS — Z9889 Other specified postprocedural states: Secondary | ICD-10-CM

## 2013-10-13 DIAGNOSIS — I059 Rheumatic mitral valve disease, unspecified: Secondary | ICD-10-CM

## 2013-10-13 DIAGNOSIS — I482 Chronic atrial fibrillation, unspecified: Secondary | ICD-10-CM

## 2013-10-13 DIAGNOSIS — Z5181 Encounter for therapeutic drug level monitoring: Secondary | ICD-10-CM

## 2013-10-13 DIAGNOSIS — I4891 Unspecified atrial fibrillation: Secondary | ICD-10-CM

## 2013-10-13 LAB — POCT INR: INR: 3.3

## 2013-11-04 ENCOUNTER — Encounter: Payer: Self-pay | Admitting: *Deleted

## 2013-11-04 ENCOUNTER — Ambulatory Visit (INDEPENDENT_AMBULATORY_CARE_PROVIDER_SITE_OTHER): Payer: BC Managed Care – PPO | Admitting: *Deleted

## 2013-11-04 DIAGNOSIS — Z9889 Other specified postprocedural states: Secondary | ICD-10-CM

## 2013-11-04 DIAGNOSIS — I059 Rheumatic mitral valve disease, unspecified: Secondary | ICD-10-CM

## 2013-11-04 DIAGNOSIS — I4891 Unspecified atrial fibrillation: Secondary | ICD-10-CM

## 2013-11-04 DIAGNOSIS — I482 Chronic atrial fibrillation, unspecified: Secondary | ICD-10-CM

## 2013-11-04 DIAGNOSIS — Z5181 Encounter for therapeutic drug level monitoring: Secondary | ICD-10-CM

## 2013-11-04 LAB — POCT INR: INR: 2.6

## 2013-11-18 ENCOUNTER — Ambulatory Visit (INDEPENDENT_AMBULATORY_CARE_PROVIDER_SITE_OTHER): Payer: BC Managed Care – PPO | Admitting: *Deleted

## 2013-11-18 ENCOUNTER — Encounter: Payer: Self-pay | Admitting: *Deleted

## 2013-11-18 DIAGNOSIS — Z5181 Encounter for therapeutic drug level monitoring: Secondary | ICD-10-CM

## 2013-11-18 DIAGNOSIS — I4891 Unspecified atrial fibrillation: Secondary | ICD-10-CM

## 2013-11-18 DIAGNOSIS — I059 Rheumatic mitral valve disease, unspecified: Secondary | ICD-10-CM

## 2013-11-18 DIAGNOSIS — Z9889 Other specified postprocedural states: Secondary | ICD-10-CM

## 2013-11-18 DIAGNOSIS — I482 Chronic atrial fibrillation, unspecified: Secondary | ICD-10-CM

## 2013-11-18 LAB — POCT INR: INR: 2.8

## 2013-12-02 ENCOUNTER — Ambulatory Visit (INDEPENDENT_AMBULATORY_CARE_PROVIDER_SITE_OTHER): Payer: BC Managed Care – PPO

## 2013-12-02 DIAGNOSIS — Z5181 Encounter for therapeutic drug level monitoring: Secondary | ICD-10-CM

## 2013-12-02 DIAGNOSIS — Z9889 Other specified postprocedural states: Secondary | ICD-10-CM

## 2013-12-02 DIAGNOSIS — I482 Chronic atrial fibrillation, unspecified: Secondary | ICD-10-CM

## 2013-12-02 DIAGNOSIS — I4891 Unspecified atrial fibrillation: Secondary | ICD-10-CM

## 2013-12-02 DIAGNOSIS — I059 Rheumatic mitral valve disease, unspecified: Secondary | ICD-10-CM

## 2013-12-02 LAB — POCT INR: INR: 3.4

## 2013-12-30 ENCOUNTER — Ambulatory Visit (INDEPENDENT_AMBULATORY_CARE_PROVIDER_SITE_OTHER): Payer: BC Managed Care – PPO | Admitting: *Deleted

## 2013-12-30 DIAGNOSIS — I059 Rheumatic mitral valve disease, unspecified: Secondary | ICD-10-CM

## 2013-12-30 DIAGNOSIS — I482 Chronic atrial fibrillation, unspecified: Secondary | ICD-10-CM

## 2013-12-30 DIAGNOSIS — I4891 Unspecified atrial fibrillation: Secondary | ICD-10-CM

## 2013-12-30 DIAGNOSIS — Z5181 Encounter for therapeutic drug level monitoring: Secondary | ICD-10-CM

## 2013-12-30 DIAGNOSIS — Z9889 Other specified postprocedural states: Secondary | ICD-10-CM

## 2013-12-30 LAB — POCT INR: INR: 2.6

## 2014-01-16 ENCOUNTER — Ambulatory Visit (INDEPENDENT_AMBULATORY_CARE_PROVIDER_SITE_OTHER): Payer: BC Managed Care – PPO | Admitting: Pharmacist

## 2014-01-16 DIAGNOSIS — Z5181 Encounter for therapeutic drug level monitoring: Secondary | ICD-10-CM

## 2014-01-16 DIAGNOSIS — Z9889 Other specified postprocedural states: Secondary | ICD-10-CM

## 2014-01-16 DIAGNOSIS — I482 Chronic atrial fibrillation, unspecified: Secondary | ICD-10-CM

## 2014-01-16 DIAGNOSIS — I059 Rheumatic mitral valve disease, unspecified: Secondary | ICD-10-CM

## 2014-01-16 DIAGNOSIS — I4891 Unspecified atrial fibrillation: Secondary | ICD-10-CM

## 2014-01-16 LAB — POCT INR: INR: 3.7

## 2014-02-03 ENCOUNTER — Ambulatory Visit (INDEPENDENT_AMBULATORY_CARE_PROVIDER_SITE_OTHER): Payer: BC Managed Care – PPO | Admitting: Pharmacist

## 2014-02-03 DIAGNOSIS — I4891 Unspecified atrial fibrillation: Secondary | ICD-10-CM

## 2014-02-03 DIAGNOSIS — Z9889 Other specified postprocedural states: Secondary | ICD-10-CM

## 2014-02-03 DIAGNOSIS — I059 Rheumatic mitral valve disease, unspecified: Secondary | ICD-10-CM

## 2014-02-03 DIAGNOSIS — I482 Chronic atrial fibrillation, unspecified: Secondary | ICD-10-CM

## 2014-02-03 DIAGNOSIS — Z5181 Encounter for therapeutic drug level monitoring: Secondary | ICD-10-CM

## 2014-02-03 LAB — POCT INR: INR: 4

## 2014-02-16 ENCOUNTER — Ambulatory Visit (INDEPENDENT_AMBULATORY_CARE_PROVIDER_SITE_OTHER): Payer: BC Managed Care – PPO | Admitting: Pharmacist

## 2014-02-16 DIAGNOSIS — I059 Rheumatic mitral valve disease, unspecified: Secondary | ICD-10-CM

## 2014-02-16 DIAGNOSIS — Z9889 Other specified postprocedural states: Secondary | ICD-10-CM

## 2014-02-16 DIAGNOSIS — I482 Chronic atrial fibrillation, unspecified: Secondary | ICD-10-CM

## 2014-02-16 DIAGNOSIS — Z5181 Encounter for therapeutic drug level monitoring: Secondary | ICD-10-CM

## 2014-02-16 DIAGNOSIS — I4891 Unspecified atrial fibrillation: Secondary | ICD-10-CM

## 2014-02-16 LAB — POCT INR: INR: 2.6

## 2014-03-02 ENCOUNTER — Ambulatory Visit (INDEPENDENT_AMBULATORY_CARE_PROVIDER_SITE_OTHER): Payer: BC Managed Care – PPO | Admitting: *Deleted

## 2014-03-02 DIAGNOSIS — I4891 Unspecified atrial fibrillation: Secondary | ICD-10-CM

## 2014-03-02 DIAGNOSIS — Z9889 Other specified postprocedural states: Secondary | ICD-10-CM

## 2014-03-02 DIAGNOSIS — I482 Chronic atrial fibrillation, unspecified: Secondary | ICD-10-CM

## 2014-03-02 DIAGNOSIS — I059 Rheumatic mitral valve disease, unspecified: Secondary | ICD-10-CM

## 2014-03-02 DIAGNOSIS — Z5181 Encounter for therapeutic drug level monitoring: Secondary | ICD-10-CM

## 2014-03-02 LAB — POCT INR: INR: 2.3

## 2014-03-16 ENCOUNTER — Ambulatory Visit (INDEPENDENT_AMBULATORY_CARE_PROVIDER_SITE_OTHER): Payer: BC Managed Care – PPO | Admitting: *Deleted

## 2014-03-16 DIAGNOSIS — I4891 Unspecified atrial fibrillation: Secondary | ICD-10-CM

## 2014-03-16 DIAGNOSIS — I059 Rheumatic mitral valve disease, unspecified: Secondary | ICD-10-CM

## 2014-03-16 DIAGNOSIS — I482 Chronic atrial fibrillation, unspecified: Secondary | ICD-10-CM

## 2014-03-16 DIAGNOSIS — Z5181 Encounter for therapeutic drug level monitoring: Secondary | ICD-10-CM

## 2014-03-16 DIAGNOSIS — Z9889 Other specified postprocedural states: Secondary | ICD-10-CM

## 2014-03-16 LAB — POCT INR: INR: 3.9

## 2014-03-30 ENCOUNTER — Ambulatory Visit (INDEPENDENT_AMBULATORY_CARE_PROVIDER_SITE_OTHER): Payer: BC Managed Care – PPO

## 2014-03-30 DIAGNOSIS — Z9889 Other specified postprocedural states: Secondary | ICD-10-CM

## 2014-03-30 DIAGNOSIS — I482 Chronic atrial fibrillation, unspecified: Secondary | ICD-10-CM

## 2014-03-30 DIAGNOSIS — Z5181 Encounter for therapeutic drug level monitoring: Secondary | ICD-10-CM

## 2014-03-30 DIAGNOSIS — I4891 Unspecified atrial fibrillation: Secondary | ICD-10-CM

## 2014-03-30 DIAGNOSIS — I059 Rheumatic mitral valve disease, unspecified: Secondary | ICD-10-CM

## 2014-03-30 LAB — POCT INR: INR: 4.3

## 2014-04-08 ENCOUNTER — Other Ambulatory Visit: Payer: Self-pay

## 2014-04-08 MED ORDER — ATORVASTATIN CALCIUM 40 MG PO TABS: 40.0000 mg | ORAL_TABLET | Freq: Every evening | ORAL | Status: DC

## 2014-04-13 ENCOUNTER — Ambulatory Visit (INDEPENDENT_AMBULATORY_CARE_PROVIDER_SITE_OTHER): Payer: BC Managed Care – PPO | Admitting: Pharmacist Clinician (PhC)/ Clinical Pharmacy Specialist

## 2014-04-13 DIAGNOSIS — Z9889 Other specified postprocedural states: Secondary | ICD-10-CM

## 2014-04-13 DIAGNOSIS — I059 Rheumatic mitral valve disease, unspecified: Secondary | ICD-10-CM

## 2014-04-13 DIAGNOSIS — I482 Chronic atrial fibrillation, unspecified: Secondary | ICD-10-CM

## 2014-04-13 DIAGNOSIS — Z5181 Encounter for therapeutic drug level monitoring: Secondary | ICD-10-CM

## 2014-04-13 DIAGNOSIS — I4891 Unspecified atrial fibrillation: Secondary | ICD-10-CM

## 2014-04-13 LAB — POCT INR: INR: 4.8

## 2014-04-26 ENCOUNTER — Emergency Department (HOSPITAL_COMMUNITY): Payer: BC Managed Care – PPO | Admitting: Certified Registered"

## 2014-04-26 ENCOUNTER — Emergency Department (HOSPITAL_COMMUNITY): Payer: BC Managed Care – PPO

## 2014-04-26 ENCOUNTER — Inpatient Hospital Stay (HOSPITAL_COMMUNITY)
Admission: EM | Admit: 2014-04-26 | Discharge: 2014-04-30 | DRG: 481 | Disposition: A | Discharge status: Home Health | Payer: BC Managed Care – PPO | Attending: Orthopedic Surgery | Admitting: Orthopedic Surgery

## 2014-04-26 ENCOUNTER — Inpatient Hospital Stay: Admit: 2014-04-26 | Payer: Self-pay | Admitting: Orthopedic Surgery

## 2014-04-26 ENCOUNTER — Encounter (HOSPITAL_COMMUNITY): Payer: Self-pay | Admitting: Emergency Medicine

## 2014-04-26 ENCOUNTER — Encounter (HOSPITAL_COMMUNITY): Admission: EM | Disposition: A | Discharge status: Home Health | Payer: Self-pay | Source: Home / Self Care

## 2014-04-26 DIAGNOSIS — Z419 Encounter for procedure for purposes other than remedying health state, unspecified: Secondary | ICD-10-CM

## 2014-04-26 DIAGNOSIS — D62 Acute posthemorrhagic anemia: Secondary | ICD-10-CM

## 2014-04-26 DIAGNOSIS — S5291XA Unspecified fracture of right forearm, initial encounter for closed fracture: Secondary | ICD-10-CM

## 2014-04-26 DIAGNOSIS — I482 Chronic atrial fibrillation: Secondary | ICD-10-CM | POA: Diagnosis present

## 2014-04-26 DIAGNOSIS — Z8781 Personal history of (healed) traumatic fracture: Secondary | ICD-10-CM | POA: Diagnosis present

## 2014-04-26 DIAGNOSIS — K219 Gastro-esophageal reflux disease without esophagitis: Secondary | ICD-10-CM | POA: Diagnosis present

## 2014-04-26 DIAGNOSIS — S52514A Nondisplaced fracture of right radial styloid process, initial encounter for closed fracture: Secondary | ICD-10-CM | POA: Diagnosis present

## 2014-04-26 DIAGNOSIS — W010XXA Fall on same level from slipping, tripping and stumbling without subsequent striking against object, initial encounter: Secondary | ICD-10-CM | POA: Diagnosis present

## 2014-04-26 DIAGNOSIS — Z952 Presence of prosthetic heart valve: Secondary | ICD-10-CM | POA: Diagnosis not present

## 2014-04-26 DIAGNOSIS — M898X5 Other specified disorders of bone, thigh: Secondary | ICD-10-CM

## 2014-04-26 DIAGNOSIS — Z7901 Long term (current) use of anticoagulants: Secondary | ICD-10-CM | POA: Diagnosis not present

## 2014-04-26 DIAGNOSIS — W19XXXA Unspecified fall, initial encounter: Secondary | ICD-10-CM | POA: Diagnosis present

## 2014-04-26 DIAGNOSIS — S52501A Unspecified fracture of the lower end of right radius, initial encounter for closed fracture: Secondary | ICD-10-CM | POA: Diagnosis present

## 2014-04-26 DIAGNOSIS — E780 Pure hypercholesterolemia: Secondary | ICD-10-CM | POA: Diagnosis present

## 2014-04-26 DIAGNOSIS — Y92007 Garden or yard of unspecified non-institutional (private) residence as the place of occurrence of the external cause: Secondary | ICD-10-CM

## 2014-04-26 DIAGNOSIS — Z79899 Other long term (current) drug therapy: Secondary | ICD-10-CM | POA: Diagnosis not present

## 2014-04-26 DIAGNOSIS — S72341A Displaced spiral fracture of shaft of right femur, initial encounter for closed fracture: Secondary | ICD-10-CM | POA: Diagnosis present

## 2014-04-26 DIAGNOSIS — M810 Age-related osteoporosis without current pathological fracture: Secondary | ICD-10-CM | POA: Diagnosis present

## 2014-04-26 DIAGNOSIS — R52 Pain, unspecified: Secondary | ICD-10-CM | POA: Diagnosis present

## 2014-04-26 DIAGNOSIS — S7291XA Unspecified fracture of right femur, initial encounter for closed fracture: Secondary | ICD-10-CM | POA: Diagnosis present

## 2014-04-26 DIAGNOSIS — I10 Essential (primary) hypertension: Secondary | ICD-10-CM | POA: Diagnosis present

## 2014-04-26 HISTORY — PX: ORIF WRIST FRACTURE: SHX2133

## 2014-04-26 HISTORY — PX: FEMUR IM NAIL: SHX1597

## 2014-04-26 LAB — CBC WITH DIFFERENTIAL/PLATELET
Basophils Absolute: 0 10*3/uL (ref 0.0–0.1)
Basophils Relative: 0 % (ref 0–1)
Eosinophils Absolute: 0 10*3/uL (ref 0.0–0.7)
Eosinophils Relative: 0 % (ref 0–5)
HEMATOCRIT: 40.2 % (ref 36.0–46.0)
Hemoglobin: 13 g/dL (ref 12.0–15.0)
LYMPHS ABS: 0.9 10*3/uL (ref 0.7–4.0)
Lymphocytes Relative: 10 % — ABNORMAL LOW (ref 12–46)
MCH: 28.2 pg (ref 26.0–34.0)
MCHC: 32.3 g/dL (ref 30.0–36.0)
MCV: 87.2 fL (ref 78.0–100.0)
MONOS PCT: 5 % (ref 3–12)
Monocytes Absolute: 0.5 10*3/uL (ref 0.1–1.0)
NEUTROS ABS: 7.6 10*3/uL (ref 1.7–7.7)
Neutrophils Relative %: 85 % — ABNORMAL HIGH (ref 43–77)
Platelets: 151 10*3/uL (ref 150–400)
RBC: 4.61 MIL/uL (ref 3.87–5.11)
RDW: 14.1 % (ref 11.5–15.5)
WBC: 9 10*3/uL (ref 4.0–10.5)

## 2014-04-26 LAB — BASIC METABOLIC PANEL
ANION GAP: 12 (ref 5–15)
Anion gap: 10 (ref 5–15)
BUN: 10 mg/dL (ref 6–23)
BUN: 11 mg/dL (ref 6–23)
CHLORIDE: 103 meq/L (ref 96–112)
CO2: 23 mEq/L (ref 19–32)
CO2: 23 mEq/L (ref 19–32)
Calcium: 8.9 mg/dL (ref 8.4–10.5)
Calcium: 7.9 mg/dL — ABNORMAL LOW (ref 8.4–10.5)
Chloride: 104 mEq/L (ref 96–112)
Creatinine, Ser: 0.8 mg/dL (ref 0.50–1.10)
Creatinine, Ser: 0.76 mg/dL (ref 0.50–1.10)
GFR calc Af Amer: >90 mL/min (ref >90)
GFR calc non Af Amer: 79 mL/min — ABNORMAL LOW (ref >90)
GFR, EST NON AFRICAN AMERICAN: 90 mL/min — AB (ref >90)
Glucose, Bld: 117 mg/dL — ABNORMAL HIGH (ref 70–99)
Glucose, Bld: 165 mg/dL — ABNORMAL HIGH (ref 70–99)
POTASSIUM: 4.2 meq/L (ref 3.7–5.3)
POTASSIUM: 4.3 meq/L (ref 3.7–5.3)
Sodium: 138 mEq/L (ref 137–147)
Sodium: 137 mEq/L (ref 137–147)

## 2014-04-26 LAB — PROTIME-INR
INR: 3.15 — ABNORMAL HIGH (ref 0.00–1.49)
Prothrombin Time: 32.6 seconds — ABNORMAL HIGH (ref 11.6–15.2)

## 2014-04-26 LAB — POCT I-STAT 4, (NA,K, GLUC, HGB,HCT)
Glucose, Bld: 115 mg/dL — ABNORMAL HIGH (ref 70–99)
HCT: 32 % — ABNORMAL LOW (ref 36.0–46.0)
HEMOGLOBIN: 10.9 g/dL — AB (ref 12.0–15.0)
POTASSIUM: 4.3 meq/L (ref 3.7–5.3)
SODIUM: 138 meq/L (ref 137–147)

## 2014-04-26 LAB — CBC
HEMATOCRIT: 28.5 % — AB (ref 36.0–46.0)
Hemoglobin: 9.1 g/dL — ABNORMAL LOW (ref 12.0–15.0)
MCH: 27.2 pg (ref 26.0–34.0)
MCHC: 31.9 g/dL (ref 30.0–36.0)
MCV: 85.3 fL (ref 78.0–100.0)
Platelets: 155 10*3/uL (ref 150–400)
RBC: 3.34 MIL/uL — ABNORMAL LOW (ref 3.87–5.11)
RDW: 14.1 % (ref 11.5–15.5)
WBC: 12.5 10*3/uL — ABNORMAL HIGH (ref 4.0–10.5)

## 2014-04-26 SURGERY — INSERTION, INTRAMEDULLARY ROD, FEMUR
Anesthesia: General | Site: Wrist | Laterality: Right

## 2014-04-26 MED ORDER — ONDANSETRON HCL 4 MG/2ML IJ SOLN: 4.0000 mg | Freq: Once | INTRAMUSCULAR | Status: DC | PRN

## 2014-04-26 MED ORDER — ACETAMINOPHEN 650 MG RE SUPP: 650.0000 mg | Freq: Four times a day (QID) | RECTAL | Status: DC | PRN

## 2014-04-26 MED ORDER — FENTANYL CITRATE 0.05 MG/ML IJ SOLN
INTRAMUSCULAR | Status: DC | PRN
  Administered 2014-04-26: 50 ug via INTRAVENOUS
  Administered 2014-04-26: 100 ug via INTRAVENOUS
  Administered 2014-04-26: 50 ug via INTRAVENOUS
  Administered 2014-04-26: 150 ug via INTRAVENOUS
  Administered 2014-04-26: 50 ug via INTRAVENOUS

## 2014-04-26 MED ORDER — OXYCODONE HCL 5 MG PO TABS
ORAL_TABLET | ORAL | Status: AC
  Filled 2014-04-26: qty 1

## 2014-04-26 MED ORDER — METHOCARBAMOL 1000 MG/10ML IJ SOLN
500.0000 mg | Freq: Four times a day (QID) | INTRAVENOUS | Status: DC | PRN
  Filled 2014-04-26: qty 5

## 2014-04-26 MED ORDER — CEFAZOLIN SODIUM-DEXTROSE 2-3 GM-% IV SOLR
INTRAVENOUS | Status: AC
  Filled 2014-04-26: qty 50

## 2014-04-26 MED ORDER — ONDANSETRON HCL 4 MG/2ML IJ SOLN
INTRAMUSCULAR | Status: DC | PRN
  Administered 2014-04-26: 4 mg via INTRAVENOUS

## 2014-04-26 MED ORDER — OXYCODONE-ACETAMINOPHEN 5-325 MG PO TABS
1.0000 | ORAL_TABLET | ORAL | Status: DC | PRN
  Administered 2014-04-27 – 2014-04-28 (×6): 2 via ORAL
  Administered 2014-04-28: 1 via ORAL
  Administered 2014-04-28 – 2014-04-30 (×7): 2 via ORAL
  Filled 2014-04-26 (×14): qty 2

## 2014-04-26 MED ORDER — CEFAZOLIN SODIUM-DEXTROSE 2-3 GM-% IV SOLR
2.0000 g | Freq: Four times a day (QID) | INTRAVENOUS | Status: AC
  Administered 2014-04-26 – 2014-04-27 (×2): 2 g via INTRAVENOUS
  Filled 2014-04-26 (×2): qty 50

## 2014-04-26 MED ORDER — DEXTROSE 5 % IV SOLN
10.0000 mg | INTRAVENOUS | Status: DC | PRN
  Administered 2014-04-26: 20 ug/min via INTRAVENOUS

## 2014-04-26 MED ORDER — ONDANSETRON HCL 4 MG PO TABS: 4.0000 mg | ORAL_TABLET | Freq: Four times a day (QID) | ORAL | Status: DC | PRN

## 2014-04-26 MED ORDER — PROPOFOL 10 MG/ML IV BOLUS
INTRAVENOUS | Status: AC
  Filled 2014-04-26: qty 20

## 2014-04-26 MED ORDER — FENTANYL CITRATE 0.05 MG/ML IJ SOLN
INTRAMUSCULAR | Status: AC
  Filled 2014-04-26: qty 5

## 2014-04-26 MED ORDER — LIDOCAINE HCL (CARDIAC) 20 MG/ML IV SOLN
INTRAVENOUS | Status: DC | PRN
  Administered 2014-04-26: 100 mg via INTRAVENOUS

## 2014-04-26 MED ORDER — METHOCARBAMOL 500 MG PO TABS
500.0000 mg | ORAL_TABLET | Freq: Four times a day (QID) | ORAL | Status: DC | PRN
  Administered 2014-04-26 – 2014-04-29 (×5): 500 mg via ORAL
  Filled 2014-04-26 (×6): qty 1

## 2014-04-26 MED ORDER — 0.9 % SODIUM CHLORIDE (POUR BTL) OPTIME
TOPICAL | Status: DC | PRN
  Administered 2014-04-26: 1000 mL

## 2014-04-26 MED ORDER — ACETAMINOPHEN 325 MG PO TABS: 650.0000 mg | ORAL_TABLET | Freq: Four times a day (QID) | ORAL | Status: DC | PRN

## 2014-04-26 MED ORDER — LACTATED RINGERS IV SOLN
INTRAVENOUS | Status: DC | PRN
  Administered 2014-04-26 (×2): via INTRAVENOUS

## 2014-04-26 MED ORDER — HYDROMORPHONE HCL 1 MG/ML IJ SOLN
1.0000 mg | Freq: Once | INTRAMUSCULAR | Status: AC
  Administered 2014-04-26: 1 mg via INTRAVENOUS
  Filled 2014-04-26: qty 1

## 2014-04-26 MED ORDER — PROMETHAZINE HCL 25 MG/ML IJ SOLN: 12.5000 mg | Freq: Four times a day (QID) | INTRAMUSCULAR | Status: DC | PRN

## 2014-04-26 MED ORDER — MIDAZOLAM HCL 5 MG/5ML IJ SOLN
INTRAMUSCULAR | Status: DC | PRN
  Administered 2014-04-26: 2 mg via INTRAVENOUS

## 2014-04-26 MED ORDER — FERROUS SULFATE 325 (65 FE) MG PO TABS
325.0000 mg | ORAL_TABLET | Freq: Two times a day (BID) | ORAL | Status: DC
  Administered 2014-04-27 – 2014-04-30 (×7): 325 mg via ORAL
  Filled 2014-04-26 (×9): qty 1

## 2014-04-26 MED ORDER — OXYCODONE HCL 5 MG/5ML PO SOLN: 5.0000 mg | Freq: Once | ORAL | Status: AC | PRN

## 2014-04-26 MED ORDER — WARFARIN SODIUM 5 MG PO TABS
5.0000 mg | ORAL_TABLET | Freq: Every day | ORAL | Status: DC
  Administered 2014-04-26: 5 mg via ORAL
  Filled 2014-04-26 (×3): qty 1

## 2014-04-26 MED ORDER — HYDROMORPHONE HCL 1 MG/ML IJ SOLN
0.2500 mg | INTRAMUSCULAR | Status: DC | PRN
  Administered 2014-04-26: 0.25 mg via INTRAVENOUS
  Administered 2014-04-26: 0.5 mg via INTRAVENOUS
  Administered 2014-04-26: 0.25 mg via INTRAVENOUS
  Administered 2014-04-26 (×2): 0.5 mg via INTRAVENOUS

## 2014-04-26 MED ORDER — PHENYLEPHRINE HCL 10 MG/ML IJ SOLN
INTRAMUSCULAR | Status: DC | PRN
  Administered 2014-04-26 (×5): 80 ug via INTRAVENOUS

## 2014-04-26 MED ORDER — DOCUSATE SODIUM 100 MG PO CAPS
100.0000 mg | ORAL_CAPSULE | Freq: Two times a day (BID) | ORAL | Status: DC
  Administered 2014-04-26 – 2014-04-30 (×8): 100 mg via ORAL
  Filled 2014-04-26 (×7): qty 1

## 2014-04-26 MED ORDER — ONDANSETRON HCL 4 MG/2ML IJ SOLN
4.0000 mg | Freq: Once | INTRAMUSCULAR | Status: AC
  Administered 2014-04-26: 4 mg via INTRAVENOUS

## 2014-04-26 MED ORDER — ALBUMIN HUMAN 5 % IV SOLN
INTRAVENOUS | Status: DC | PRN
  Administered 2014-04-26: 18:00:00 via INTRAVENOUS

## 2014-04-26 MED ORDER — PROPOFOL 10 MG/ML IV BOLUS
INTRAVENOUS | Status: DC | PRN
  Administered 2014-04-26: 130 mg via INTRAVENOUS

## 2014-04-26 MED ORDER — SODIUM CHLORIDE 0.9 % IV SOLN
INTRAVENOUS | Status: DC
  Administered 2014-04-26 – 2014-04-28 (×3): via INTRAVENOUS

## 2014-04-26 MED ORDER — SUCCINYLCHOLINE CHLORIDE 20 MG/ML IJ SOLN
INTRAMUSCULAR | Status: DC | PRN
  Administered 2014-04-26: 140 mg via INTRAVENOUS

## 2014-04-26 MED ORDER — SODIUM CHLORIDE 0.9 % IV BOLUS (SEPSIS)
500.0000 mL | Freq: Once | INTRAVENOUS | Status: AC
  Administered 2014-04-26: 500 mL via INTRAVENOUS

## 2014-04-26 MED ORDER — ONDANSETRON HCL 4 MG/2ML IJ SOLN
4.0000 mg | Freq: Four times a day (QID) | INTRAMUSCULAR | Status: DC | PRN
  Administered 2014-04-26: 4 mg via INTRAVENOUS
  Filled 2014-04-26: qty 2

## 2014-04-26 MED ORDER — OXYCODONE HCL 5 MG PO TABS
5.0000 mg | ORAL_TABLET | Freq: Once | ORAL | Status: AC | PRN
  Administered 2014-04-26: 5 mg via ORAL

## 2014-04-26 MED ORDER — ZOLPIDEM TARTRATE 5 MG PO TABS
5.0000 mg | ORAL_TABLET | Freq: Every evening | ORAL | Status: DC | PRN
  Administered 2014-04-27: 5 mg via ORAL
  Filled 2014-04-26: qty 1

## 2014-04-26 MED ORDER — PANTOPRAZOLE SODIUM 40 MG PO TBEC
40.0000 mg | DELAYED_RELEASE_TABLET | Freq: Every day | ORAL | Status: DC
  Administered 2014-04-27 – 2014-04-30 (×4): 40 mg via ORAL
  Filled 2014-04-26 (×4): qty 1

## 2014-04-26 MED ORDER — METHOCARBAMOL 500 MG PO TABS
ORAL_TABLET | ORAL | Status: AC
  Filled 2014-04-26: qty 1

## 2014-04-26 MED ORDER — HYDROMORPHONE HCL 1 MG/ML IJ SOLN: 1.0000 mg | INTRAMUSCULAR | Status: DC | PRN

## 2014-04-26 MED ORDER — ONDANSETRON HCL 4 MG/2ML IJ SOLN
4.0000 mg | Freq: Once | INTRAMUSCULAR | Status: DC
  Filled 2014-04-26: qty 2

## 2014-04-26 MED ORDER — HYDROMORPHONE HCL 1 MG/ML IJ SOLN
INTRAMUSCULAR | Status: AC
  Administered 2014-04-26: 0.25 mg via INTRAVENOUS
  Filled 2014-04-26: qty 1

## 2014-04-26 MED ORDER — BUPIVACAINE HCL 0.5 % IJ SOLN
INTRAMUSCULAR | Status: DC | PRN
  Administered 2014-04-26: 10 mL

## 2014-04-26 MED ORDER — WARFARIN - PHARMACIST DOSING INPATIENT
Freq: Every day | Status: DC
Start: 2014-04-27 — End: 2014-04-30

## 2014-04-26 MED ORDER — HYDROMORPHONE HCL 1 MG/ML IJ SOLN
INTRAMUSCULAR | Status: AC
  Filled 2014-04-26: qty 1

## 2014-04-26 MED ORDER — LACTATED RINGERS IV SOLN
INTRAVENOUS | Status: DC
  Administered 2014-04-26: 16:00:00 via INTRAVENOUS

## 2014-04-26 MED ORDER — DEXAMETHASONE SODIUM PHOSPHATE 4 MG/ML IJ SOLN
INTRAMUSCULAR | Status: DC | PRN
  Administered 2014-04-26: 4 mg via INTRAVENOUS

## 2014-04-26 MED ORDER — ALUM & MAG HYDROXIDE-SIMETH 200-200-20 MG/5ML PO SUSP: 30.0000 mL | ORAL | Status: DC | PRN

## 2014-04-26 MED ORDER — MORPHINE SULFATE 4 MG/ML IJ SOLN
4.0000 mg | Freq: Once | INTRAMUSCULAR | Status: AC
  Administered 2014-04-26: 4 mg via INTRAVENOUS
  Filled 2014-04-26: qty 1

## 2014-04-26 MED ORDER — WARFARIN SODIUM 5 MG PO TABS
5.0000 mg | ORAL_TABLET | Freq: Every day | ORAL | Status: DC
Start: 2014-04-26 — End: 2014-04-26

## 2014-04-26 MED ORDER — METOPROLOL SUCCINATE ER 50 MG PO TB24
75.0000 mg | ORAL_TABLET | Freq: Every day | ORAL | Status: DC
  Administered 2014-04-27 – 2014-04-30 (×4): 75 mg via ORAL
  Filled 2014-04-26 (×10): qty 1

## 2014-04-26 MED ORDER — MIDAZOLAM HCL 2 MG/2ML IJ SOLN
INTRAMUSCULAR | Status: AC
  Filled 2014-04-26: qty 2

## 2014-04-26 MED ORDER — CEFAZOLIN SODIUM-DEXTROSE 2-3 GM-% IV SOLR
INTRAVENOUS | Status: DC | PRN
  Administered 2014-04-26: 2 g via INTRAVENOUS

## 2014-04-26 SURGICAL SUPPLY — 87 items
BANDAGE ELASTIC 3 VELCRO ST LF (GAUZE/BANDAGES/DRESSINGS) ×4 IMPLANT
BANDAGE ELASTIC 4 VELCRO ST LF (GAUZE/BANDAGES/DRESSINGS) ×4 IMPLANT
BANDAGE ELASTIC 6 VELCRO ST LF (GAUZE/BANDAGES/DRESSINGS) ×4 IMPLANT
BIT DRILL 2 FAST STEP (BIT) ×4 IMPLANT
BIT DRILL 2.5X4 QC (BIT) ×4 IMPLANT
BIT DRILL 4.3MMS DISTAL GRDTED (BIT) ×2 IMPLANT
BIT DRILL 5.3 (BIT) ×4 IMPLANT
BIT DRILL 6.5X4.8 (BIT) ×4 IMPLANT
BLADE SURG ROTATE 9660 (MISCELLANEOUS) ×4 IMPLANT
BNDG COHESIVE 6X5 TAN STRL LF (GAUZE/BANDAGES/DRESSINGS) ×8 IMPLANT
BNDG ESMARK 4X9 LF (GAUZE/BANDAGES/DRESSINGS) ×4 IMPLANT
BNDG GAUZE ELAST 4 BULKY (GAUZE/BANDAGES/DRESSINGS) ×4 IMPLANT
CLOSURE STERI-STRIP 1/2X4 (GAUZE/BANDAGES/DRESSINGS) ×1
CLSR STERI-STRIP ANTIMIC 1/2X4 (GAUZE/BANDAGES/DRESSINGS) ×3 IMPLANT
CORTICAL BONE SCR 5.0MM X 48MM (Screw) ×4 IMPLANT
COVER PERINEAL POST (MISCELLANEOUS) ×4 IMPLANT
COVER SURGICAL LIGHT HANDLE (MISCELLANEOUS) ×8 IMPLANT
CUFF TOURNIQUET SINGLE 18IN (TOURNIQUET CUFF) ×4 IMPLANT
CUFF TOURNIQUET SINGLE 24IN (TOURNIQUET CUFF) IMPLANT
DECANTER SPIKE VIAL GLASS SM (MISCELLANEOUS) ×4 IMPLANT
DRAPE OEC MINIVIEW 54X84 (DRAPES) IMPLANT
DRAPE STERI IOBAN 125X83 (DRAPES) ×4 IMPLANT
DRAPE U-SHAPE 47X51 STRL (DRAPES) ×4 IMPLANT
DRILL 4.3MMS DISTAL GRADUATED (BIT) ×4
DRSG MEPILEX BORDER 4X4 (GAUZE/BANDAGES/DRESSINGS) ×4 IMPLANT
DRSG MEPILEX BORDER 4X8 (GAUZE/BANDAGES/DRESSINGS) ×4 IMPLANT
DURAPREP 26ML APPLICATOR (WOUND CARE) ×4 IMPLANT
ELECT CAUTERY BLADE 6.4 (BLADE) ×4 IMPLANT
ELECT REM PT RETURN 9FT ADLT (ELECTROSURGICAL) ×4
ELECTRODE REM PT RTRN 9FT ADLT (ELECTROSURGICAL) ×2 IMPLANT
EVACUATOR 1/8 PVC DRAIN (DRAIN) IMPLANT
GAUZE SPONGE 4X4 12PLY STRL (GAUZE/BANDAGES/DRESSINGS) ×4 IMPLANT
GAUZE XEROFORM 5X9 LF (GAUZE/BANDAGES/DRESSINGS) ×4 IMPLANT
GLOVE BIO SURGEON STRL SZ7.5 (GLOVE) ×8 IMPLANT
GLOVE BIOGEL PI IND STRL 8 (GLOVE) ×4 IMPLANT
GLOVE BIOGEL PI INDICATOR 8 (GLOVE) ×4
GLOVE ECLIPSE 7.5 STRL STRAW (GLOVE) ×8 IMPLANT
GOWN STRL REUS W/ TWL LRG LVL3 (GOWN DISPOSABLE) ×2 IMPLANT
GOWN STRL REUS W/ TWL XL LVL3 (GOWN DISPOSABLE) ×4 IMPLANT
GOWN STRL REUS W/TWL LRG LVL3 (GOWN DISPOSABLE) ×2
GOWN STRL REUS W/TWL XL LVL3 (GOWN DISPOSABLE) ×4
GUIDEPIN 3.2X17.5 THRD DISP (PIN) ×8 IMPLANT
GUIDEWIRE BALL NOSE 80CM (WIRE) ×4 IMPLANT
K-WIRE 1.6 (WIRE) ×2
K-WIRE FX5X1.6XNS BN SS (WIRE) ×2
KIT BASIN OR (CUSTOM PROCEDURE TRAY) ×4 IMPLANT
KIT ROOM TURNOVER OR (KITS) ×4 IMPLANT
KWIRE FX5X1.6XNS BN SS (WIRE) ×2 IMPLANT
LINER BOOT UNIVERSAL DISP (MISCELLANEOUS) ×4 IMPLANT
MANIFOLD NEPTUNE II (INSTRUMENTS) ×4 IMPLANT
NAIL TROCH RH 11MMX38CM (Nail) ×4 IMPLANT
NEEDLE 22X1 1/2 (OR ONLY) (NEEDLE) IMPLANT
NS IRRIG 1000ML POUR BTL (IV SOLUTION) ×4 IMPLANT
PACK GENERAL/GYN (CUSTOM PROCEDURE TRAY) ×4 IMPLANT
PACK ORTHO EXTREMITY (CUSTOM PROCEDURE TRAY) ×4 IMPLANT
PAD ARMBOARD 7.5X6 YLW CONV (MISCELLANEOUS) ×8 IMPLANT
PAD CAST 4YDX4 CTTN HI CHSV (CAST SUPPLIES) ×2 IMPLANT
PADDING CAST COTTON 4X4 STRL (CAST SUPPLIES) ×2
PEG SUBCHONDRAL SMOOTH 2.0X18 (Peg) ×8 IMPLANT
PEG SUBCHONDRAL SMOOTH 2.0X20 (Peg) ×8 IMPLANT
PLATE SHORT 21.6X48.9 NRRW RT (Plate) ×4 IMPLANT
SCREW ACE CORTICAL (Screw) ×2 IMPLANT
SCREW BN 12X3.5XNS CORT TI (Screw) ×4 IMPLANT
SCREW BN 13X3.5XNS CORT TI (Screw) ×2 IMPLANT
SCREW BN FT 75X6.5XST DRV (Screw) ×2 IMPLANT
SCREW BONE CORTICAL 5.0X58 (Screw) ×4 IMPLANT
SCREW CANCELLOUS 6.5X90 (Screw) ×4 IMPLANT
SCREW CORT 3.5X12 (Screw) ×4 IMPLANT
SCREW CORT 3.5X13 (Screw) ×2 IMPLANT
SCREW CORTICL BON 5.0MM X 48MM (Screw) ×2 IMPLANT
SCREW LAG 6.5X80MM (Screw) ×4 IMPLANT
SCREW MULTI DIRECT 20MM (Screw) ×4 IMPLANT
SCREWDRIVER HEX TIP 3.5MM (MISCELLANEOUS) ×4 IMPLANT
SPONGE LAP 4X18 X RAY DECT (DISPOSABLE) ×4 IMPLANT
STAPLER VISISTAT 35W (STAPLE) ×4 IMPLANT
SUCTION FRAZIER TIP 10 FR DISP (SUCTIONS) ×4 IMPLANT
SUT ETHILON 4 0 PS 2 18 (SUTURE) ×4 IMPLANT
SUT VIC AB 0 CTB1 27 (SUTURE) ×8 IMPLANT
SUT VIC AB 1 CTB1 27 (SUTURE) ×4 IMPLANT
SUT VIC AB 2-0 CTB1 (SUTURE) ×4 IMPLANT
SYR CONTROL 10ML LL (SYRINGE) IMPLANT
TOWEL OR 17X24 6PK STRL BLUE (TOWEL DISPOSABLE) ×4 IMPLANT
TOWEL OR 17X26 10 PK STRL BLUE (TOWEL DISPOSABLE) ×4 IMPLANT
TUBE CONNECTING 12'X1/4 (SUCTIONS) ×1
TUBE CONNECTING 12X1/4 (SUCTIONS) ×3 IMPLANT
WATER STERILE IRR 1000ML POUR (IV SOLUTION) ×4 IMPLANT
WRAP KNEE MAXI GEL POST OP (GAUZE/BANDAGES/DRESSINGS) ×4 IMPLANT

## 2014-04-26 NOTE — Anesthesia Preprocedure Evaluation (Addendum)
Anesthesia Evaluation  Patient identified by MRN, date of birth, ID band Patient awake    Reviewed: Allergy & Precautions, H&P , NPO status , Patient's Chart, lab work & pertinent test results, reviewed documented beta blocker date and time   Airway Mallampati: II  TM Distance: >3 FB Neck ROM: Full    Dental  (+) Teeth Intact, Dental Advisory Given   Pulmonary  breath sounds clear to auscultation        Cardiovascular hypertension, Pt. on medications and Pt. on home beta blockers + dysrhythmias Atrial Fibrillation Rhythm:Regular Rate:Normal     Neuro/Psych    GI/Hepatic GERD-  Medicated and Controlled,  Endo/Other  Hyperthyroidism   Renal/GU      Musculoskeletal   Abdominal   Peds  Hematology   Anesthesia Other Findings Hx of MVR with St. Jude valve.    Reproductive/Obstetrics                            Anesthesia Physical Anesthesia Plan  ASA: III  Anesthesia Plan: General   Post-op Pain Management:    Induction: Intravenous, Rapid sequence and Cricoid pressure planned  Airway Management Planned: Oral ETT  Additional Equipment:   Intra-op Plan:   Post-operative Plan: Extubation in OR  Informed Consent:   Dental advisory given  Plan Discussed with: CRNA, Anesthesiologist and Surgeon  Anesthesia Plan Comments:        Anesthesia Quick Evaluation

## 2014-04-26 NOTE — Progress Notes (Signed)
Due to pain with moving, pt refused sacral dressing prophylaxis.

## 2014-04-26 NOTE — Progress Notes (Signed)
ANTICOAGULATION CONSULT NOTE - Initial Consult  Pharmacy Consult for Coumadin Indication: Afib, history of MVR  No Known Allergies  Patient Measurements: Height: 5\' 3"  (160 cm) Weight: 176 lb (79.833 kg) IBW/kg (Calculated) : 52.4  Vital Signs: Temp: 97.7 F (36.5 C) (11/04 2049) Temp Source: Oral (11/04 2049) BP: 107/56 mmHg (11/04 2049) Pulse Rate: 88 (11/04 2049)  Labs:  Recent Labs  04/26/14 1204 04/26/14 1739  HGB 13.0 10.9*  HCT 40.2 32.0*  PLT 151  --   LABPROT 32.6*  --   INR 3.15*  --   CREATININE 0.80  --     Estimated Creatinine Clearance: 74.8 mL/min (by C-G formula based on Cr of 0.8).   Medical History: Past Medical History  Diagnosis Date  . Rheumatic heart disease     CARDIOLOGIST-- DR Marcello Moores [facility]  . HTN (hypertension)     unspecified  . Hypercholesterolemia   . GERD (gastroesophageal reflux disease)   . Goiter     multinodular  . Anticoagulated on warfarin   . Pulmonary hypertension, mild   . Hyperlipidemia   . History of hyperthyroidism     2003--  PTU [place]  . Chronic atrial fibrillation     SINCE 1997  . S/P MVR (mitral valve replacement)     1997  [facility]--  SECONDARY TO SEVERE MV STENOSIS    Assessment: 58 year old female s/p ORIF of wrist fracture On Coumadin PTA Confirmed with patient -- her goal is 3 to 3.5 Dose PTA = 5 mg daily --> INR today is therapeutic  Goal of Therapy:  INR goal = 3 to 3.5 Monitor platelets by anticoagulation protocol: Yes   Plan:  1) Coumadin 5 mg po daily - start tonight 2) Daily INR  Thank you. Anette Guarneri, PharmD [phone]  Tad Moore 04/26/2014,9:28 PM

## 2014-04-26 NOTE — Transfer of Care (Signed)
Immediate Anesthesia Transfer of Care Note  Patient: [name]  Procedure(s) Performed: Procedure(s): INTRAMEDULLARY (IM) NAIL FEMORAL (Right) OPEN REDUCTION INTERNAL FIXATION (ORIF) WRIST FRACTURE (Right)  Patient Location: PACU  Anesthesia Type:General  Level of Consciousness: awake and alert   Airway & Oxygen Therapy: Patient Spontanous Breathing and Patient connected to nasal cannula oxygen  Post-op Assessment: Report given to PACU RN and Post -op Vital signs reviewed and stable  Post vital signs: Reviewed and stable  Complications: No apparent anesthesia complications

## 2014-04-26 NOTE — Anesthesia Postprocedure Evaluation (Signed)
  Anesthesia Post-op Note  Patient: [name]  Procedure(s) Performed: Procedure(s): INTRAMEDULLARY (IM) NAIL FEMORAL (Right) OPEN REDUCTION INTERNAL FIXATION (ORIF) WRIST FRACTURE (Right)  Patient Location: PACU  Anesthesia Type:General  Level of Consciousness: awake, alert  and oriented  Airway and Oxygen Therapy: Patient Spontanous Breathing  Post-op Pain: moderate  Post-op Assessment: Post-op Vital signs reviewed  Post-op Vital Signs: Reviewed  Last Vitals:  Filed Vitals:   04/26/14 2049  BP: 107/56  Pulse: 88  Temp: 36.5 C  Resp: 12    Complications: No apparent anesthesia complications

## 2014-04-26 NOTE — H&P (Signed)
PREOPERATIVE H&P  Chief Complaint: 58 year old female with history of osteoporosis who fell earlier today and suffered right wrist and femur fractures.  HPI: [name] is a 58 y.o. female who presents for evaluation of right wrist and right femur fractures.. It has been present for several hours and has been worsening.  The patient is on Coumadin currently.  She has not had previous history of problems with the femur.  She has had a previous ankle fracture and ulnar shortening. She has failed conservative measures. Pain is rated as severe.  Past Medical History  Diagnosis Date  . Rheumatic heart disease     CARDIOLOGIST-- DR Marcello Moores WALL  . HTN (hypertension)     unspecified  . Hypercholesterolemia   . GERD (gastroesophageal reflux disease)   . Goiter     multinodular  . Anticoagulated on warfarin   . Pulmonary hypertension, mild   . Hyperlipidemia   . History of hyperthyroidism     2003--  PTU [place]  . Chronic atrial fibrillation     SINCE 1997  . S/P MVR (mitral valve replacement)     1997  [facility]--  SECONDARY TO SEVERE MV STENOSIS   Past Surgical History  Procedure Laterality Date  . Wrist arthroscopy Right 12-10-2007    debridement and ulnar shortening osteotomy w/ plate  . Carpal tunnel release Right 01-20-2007  . Ercp  08/15/2011    Procedure: ENDOSCOPIC RETROGRADE CHOLANGIOPANCREATOGRAPHY (ERCP);  Surgeon: Jeryl Columbia, MD;  Location: [facility] ENDOSCOPY;  Service: Endoscopy;  Laterality: N/A;  . Orif ankle fracture Right 11/05/2012    Procedure: OPEN REDUCTION INTERNAL FIXATION (ORIF) ANKLE FRACTURE only operating on right;  Surgeon: Alta Corning, MD;  Location: [place];  Service: Orthopedics;  Laterality: Right;  . Anterior cervical decomp/discectomy fusion  12-21-2001    C5 --  C6  . Laparoscopic cholecystectomy  04-08-2002  . Cardiac catheterization  12-28-2006  DR [facility]    NORMAL CORONARY ARTERIES  . Mitral valve replacement  1997    [facility]  . Orif right ulnar fx  w/ iliac crest bone graft  10-06-2008  . Ercp      MULTIPLE--  INCLUDING STENTS, SPHINTEROTOMY'S  STONE REMOVAL   . Transthoracic echocardiogram  06-29-2009  dr wall    normal lvsf/ ef 55-60%/  normal bileaflet mechanical mvr/ moderated dilated la/ moderate tr  . I&d extremity Right 02/07/2013    Procedure: IRRIGATION AND DEBRIDEMENT OF RIGHT LOWER LEG WITH PLACEMENT OF ACELL AND WOULND VAC;  Surgeon: Theodoro Kos, DO;  Location: [place];  Service: Plastics;  Laterality: Right;   History   Social History  . Marital Status: Divorced    Spouse Name: N/A    Number of Children: N/A  . Years of Education: N/A   Occupational History  . Control and instrumentation engineer    Social History Main Topics  . Smoking status: Never Smoker   . Smokeless tobacco: None  . Alcohol Use: No  . Drug Use: No  . Sexual Activity: None   Other Topics Concern  . None   Social History Narrative   Family History  Problem Relation Age of Onset  . Hypothyroidism Sister   . Gallbladder disease Sister   . Coronary artery disease    . Diabetes Mother   . Hypertension Mother   . Heart disease Father   . Stroke Father    No Known Allergies Prior to Admission medications   Medication Sig Start Date End Date  Taking? Authorizing Provider  atorvastatin (LIPITOR) 40 MG tablet Take 1 tablet (40 mg total) by mouth every evening. 04/08/14  Yes Lelon Perla, MD  metoprolol succinate (TOPROL-XL) 50 MG 24 hr tablet Take 75 mg by mouth daily. Take with or immediately following a meal.   Yes Historical Provider, MD  Multiple Vitamins-Minerals (CENTRUM SILVER ADULT 50+ PO) Take 1 tablet by mouth daily.   Yes Historical Provider, MD  omeprazole (PRILOSEC) 20 MG capsule Take 20 mg by mouth every morning.    Yes Historical Provider, MD  warfarin (COUMADIN) 5 MG tablet Take 5 mg by mouth daily.   Yes Historical Provider, MD  atorvastatin (LIPITOR) 40 MG tablet TAKE ONE TABLET BY MOUTH EVERY DAY Patient not  taking: Reported on 04/26/2014    Lelon Perla, MD  metoprolol succinate (TOPROL-XL) 50 MG 24 hr tablet Take one and one half tablets once daily Patient not taking: Reported on 04/26/2014 07/14/13   Lelon Perla, MD  warfarin (COUMADIN) 5 MG tablet AS DIRECTED Patient not taking: Reported on 04/26/2014 06/06/13   Lelon Perla, MD     Positive ROS: none  All other systems have been reviewed and were otherwise negative with the exception of those mentioned in the HPI and as above.  Physical Exam: Filed Vitals:   04/26/14 1330  BP: 139/82  Pulse: 84  Temp:   Resp: 15    General: Alert, no acute distress Cardiovascular: No pedal edema Respiratory: No cyanosis, no use of accessory musculature GI: No organomegaly, abdomen is soft and non-tender Skin: No lesions in the area of chief complaint Neurologic: Sensation intact distally Psychiatric: Patient is competent for consent with normal mood and affect Lymphatic: No axillary or cervical lymphadenopathy  MUSCULOSKELETAL: right leg: X I rotated and shortened with obvious angular deformity.  She is neurovascularly intact distally.  Right wrist: Pain with range of motion.  Otherwise unremarkable.  She is neurovascular intact in the wrist. Recent Results (from the past 2160 hour(s))  POCT INR     Status: None   Collection Time: 02/03/14  7:31 AM  Result Value Ref Range   INR 4.0   POCT INR     Status: None   Collection Time: 02/16/14 12:04 PM  Result Value Ref Range   INR 2.6   POCT INR     Status: None   Collection Time: 03/02/14  7:48 AM  Result Value Ref Range   INR 2.3   POCT INR     Status: None   Collection Time: 03/16/14  8:18 AM  Result Value Ref Range   INR 3.9   POCT INR     Status: None   Collection Time: 03/30/14  8:23 AM  Result Value Ref Range   INR 4.3   POCT INR     Status: None   Collection Time: 04/13/14  7:30 AM  Result Value Ref Range   INR 4.8   Basic metabolic panel     Status: Abnormal    Collection Time: 04/26/14 12:04 PM  Result Value Ref Range   Sodium 138 137 - 147 mEq/L   Potassium 4.2 3.7 - 5.3 mEq/L   Chloride 103 96 - 112 mEq/L   CO2 23 19 - 32 mEq/L   Glucose, Bld 117 (H) 70 - 99 mg/dL   BUN 10 6 - 23 mg/dL   Creatinine, Ser 0.80 0.50 - 1.10 mg/dL   Calcium 8.9 8.4 - 10.5 mg/dL   GFR calc non  Af Amer 79 (L) >90 mL/min   GFR calc Af Amer >90 >90 mL/min    Comment: (NOTE) The eGFR has been calculated using the CKD EPI equation. This calculation has not been validated in all clinical situations. eGFR's persistently <90 mL/min signify possible Chronic Kidney Disease.    Anion gap 12 5 - 15  CBC with Differential     Status: Abnormal   Collection Time: 04/26/14 12:04 PM  Result Value Ref Range   WBC 9.0 4.0 - 10.5 K/uL   RBC 4.61 3.87 - 5.11 MIL/uL   Hemoglobin 13.0 12.0 - 15.0 g/dL   HCT 40.2 36.0 - 46.0 %   MCV 87.2 78.0 - 100.0 fL   MCH 28.2 26.0 - 34.0 pg   MCHC 32.3 30.0 - 36.0 g/dL   RDW 14.1 11.5 - 15.5 %   Platelets 151 150 - 400 K/uL   Neutrophils Relative % 85 (H) 43 - 77 %   Neutro Abs 7.6 1.7 - 7.7 K/uL   Lymphocytes Relative 10 (L) 12 - 46 %   Lymphs Abs 0.9 0.7 - 4.0 K/uL   Monocytes Relative 5 3 - 12 %   Monocytes Absolute 0.5 0.1 - 1.0 K/uL   Eosinophils Relative 0 0 - 5 %   Eosinophils Absolute 0.0 0.0 - 0.7 K/uL   Basophils Relative 0 0 - 1 %   Basophils Absolute 0.0 0.0 - 0.1 K/uL  Protime-INR     Status: Abnormal   Collection Time: 04/26/14 12:04 PM  Result Value Ref Range   Prothrombin Time 32.6 (H) 11.6 - 15.2 seconds   INR 3.15 (H) 0.00 - 1.49   X-ray: Distal third comminuted femur fracture.  There is angulation and displacement.  Right wrist fracture intra-articular with minimal displacement. Assessment/Plan: Nondisplaced distal radius fracture right.  Comminuted angulated femur fracture right.  I believe the risk and benefits of the patient being on Coumadin therapy and the possibility of bleeding significantly from the  leg.  I think that stabilization is the safest course of action.  I think this should be done relatively urgently though it is not an emergency.  I think the risks are be fixed given that we'll help with mobilization for the patient.   The risks benefits and alternatives were discussed with the patient including but not limited to the risks of nonoperative treatment, versus surgical intervention including infection, bleeding, nerve injury, malunion, nonunion, hardware prominence, hardware failure, need for hardware removal, blood clots, cardiopulmonary complications, morbidity, mortality, among others, and they were willing to proceed.  Predicted outcome is good, although there will be at least a six to nine month expected recovery.  Alta Corning, MD 04/26/2014 2:52 PM

## 2014-04-26 NOTE — Progress Notes (Signed)
Orthopedic Tech Progress Note Patient Details:  [name] 08-12-1955 [id number] In reference to the order for an overhead frame with trapeze, pt. has an acute wrist Fx in addition to her principal problem.  Will hold OHF until physician may be consulted.     Darrol Poke 04/26/2014, 7:37 PM

## 2014-04-26 NOTE — ED Notes (Addendum)
Per EMS, pt had a fall on her porch today. Pt reports that she slipped and fell. No LOC and did not hit her head. Pt reports right leg pain and right wrist pain.  Pt is on warfarin for a mechanical heart valve.

## 2014-04-26 NOTE — Anesthesia Procedure Notes (Signed)
Procedure Name: Intubation Date/Time: 04/26/2014 4:22 PM Performed by: Ollen Bowl Pre-anesthesia Checklist: Patient identified, Emergency Drugs available, Suction available, Patient being monitored and Timeout performed Patient Re-evaluated:Patient Re-evaluated prior to inductionOxygen Delivery Method: Circle system utilized and Simple face mask Preoxygenation: Pre-oxygenation with 100% oxygen Intubation Type: IV induction Ventilation: Mask ventilation without difficulty Laryngoscope Size: Mac and 3 Grade View: Grade II Tube type: Oral Tube size: 7.0 mm Number of attempts: 1 Airway Equipment and Method: Patient positioned with wedge pillow and Stylet Placement Confirmation: ETT inserted through vocal cords under direct vision,  positive ETCO2 and breath sounds checked- equal and bilateral Secured at: 22 cm Tube secured with: Tape Dental Injury: Teeth and Oropharynx as per pre-operative assessment

## 2014-04-26 NOTE — ED Provider Notes (Signed)
CSN: [id number]     Arrival date & time 04/26/14  1128 History   First MD Initiated Contact with Patient 04/26/14 1150     Chief Complaint  Patient presents with  . Fall  . Leg Pain     (Consider location/radiation/quality/duration/timing/severity/associated sxs/prior Treatment) HPI..... Accidental mechanical fall just prior to admission.  Patient complains of right mid thigh and right wrist pain. No head or neck trauma. No other injuries. Severity is moderate-to-severe. Palpation position make pain worse.  Past Medical History  Diagnosis Date  . Rheumatic heart disease     CARDIOLOGIST-- DR Marcello Moores WALL  . HTN (hypertension)     unspecified  . Hypercholesterolemia   . GERD (gastroesophageal reflux disease)   . Goiter     multinodular  . Anticoagulated on warfarin   . Pulmonary hypertension, mild   . Hyperlipidemia   . History of hyperthyroidism     2003--  PTU [place]  . Chronic atrial fibrillation     SINCE 1997  . S/P MVR (mitral valve replacement)     1997  [facility]--  SECONDARY TO SEVERE MV STENOSIS   Past Surgical History  Procedure Laterality Date  . Wrist arthroscopy Right 12-10-2007    debridement and ulnar shortening osteotomy w/ plate  . Carpal tunnel release Right 01-20-2007  . Ercp  08/15/2011    Procedure: ENDOSCOPIC RETROGRADE CHOLANGIOPANCREATOGRAPHY (ERCP);  Surgeon: Jeryl Columbia, MD;  Location: [facility] ENDOSCOPY;  Service: Endoscopy;  Laterality: N/A;  . Orif ankle fracture Right 11/05/2012    Procedure: OPEN REDUCTION INTERNAL FIXATION (ORIF) ANKLE FRACTURE only operating on right;  Surgeon: Alta Corning, MD;  Location: [place];  Service: Orthopedics;  Laterality: Right;  . Anterior cervical decomp/discectomy fusion  12-21-2001    C5 --  C6  . Laparoscopic cholecystectomy  04-08-2002  . Cardiac catheterization  12-28-2006  DR [facility]    NORMAL CORONARY ARTERIES  . Mitral valve replacement  1997    [facility]  . Orif right ulnar fx w/ iliac crest bone graft   10-06-2008  . Ercp      MULTIPLE--  INCLUDING STENTS, SPHINTEROTOMY'S  STONE REMOVAL   . Transthoracic echocardiogram  06-29-2009  dr wall    normal lvsf/ ef 55-60%/  normal bileaflet mechanical mvr/ moderated dilated la/ moderate tr  . I&d extremity Right 02/07/2013    Procedure: IRRIGATION AND DEBRIDEMENT OF RIGHT LOWER LEG WITH PLACEMENT OF ACELL AND WOULND VAC;  Surgeon: Theodoro Kos, DO;  Location: [place];  Service: Plastics;  Laterality: Right;   Family History  Problem Relation Age of Onset  . Hypothyroidism Sister   . Gallbladder disease Sister   . Coronary artery disease    . Diabetes Mother   . Hypertension Mother   . Heart disease Father   . Stroke Father    History  Substance Use Topics  . Smoking status: Never Smoker   . Smokeless tobacco: Not on file  . Alcohol Use: No   OB History    No data available     Review of Systems  All other systems reviewed and are negative.     Allergies  Review of patient's allergies indicates no known allergies.  Home Medications   Prior to Admission medications   Medication Sig Start Date End Date Taking? Authorizing Provider  atorvastatin (LIPITOR) 40 MG tablet Take 1 tablet (40 mg total) by mouth every evening. 04/08/14  Yes Lelon Perla, MD  metoprolol succinate (TOPROL-XL) 50  MG 24 hr tablet Take 75 mg by mouth daily. Take with or immediately following a meal.   Yes Historical Provider, MD  Multiple Vitamins-Minerals (CENTRUM SILVER ADULT 50+ PO) Take 1 tablet by mouth daily.   Yes Historical Provider, MD  omeprazole (PRILOSEC) 20 MG capsule Take 20 mg by mouth every morning.    Yes Historical Provider, MD  warfarin (COUMADIN) 5 MG tablet Take 5 mg by mouth daily.   Yes Historical Provider, MD  atorvastatin (LIPITOR) 40 MG tablet TAKE ONE TABLET BY MOUTH EVERY DAY Patient not taking: Reported on 04/26/2014    Lelon Perla, MD  metoprolol succinate (TOPROL-XL) 50 MG 24 hr tablet Take one and  one half tablets once daily Patient not taking: Reported on 04/26/2014 07/14/13   Lelon Perla, MD  warfarin (COUMADIN) 5 MG tablet AS DIRECTED Patient not taking: Reported on 04/26/2014 06/06/13   Lelon Perla, MD   BP 126/65 mmHg  Pulse 91  Temp(Src) 98.4 F (36.9 C) (Oral)  Resp 10  SpO2 98% Physical Exam  Constitutional: She is oriented to person, place, and time. She appears well-developed and well-nourished.  HENT:  Head: Normocephalic and atraumatic.  Eyes: Conjunctivae and EOM are normal. Pupils are equal, round, and reactive to light.  Neck: Normal range of motion. Neck supple.  Cardiovascular: Normal rate, regular rhythm and normal heart sounds.   Pulmonary/Chest: Effort normal and breath sounds normal.  Abdominal: Soft. Bowel sounds are normal.  Musculoskeletal:  Right lower extremity: Tender and edematous right mid thigh. Right upper extremity:  Tender peri ight wrist.  Neurological: She is alert and oriented to person, place, and time.  Skin: Skin is warm and dry.  Psychiatric: She has a normal mood and affect. Her behavior is normal.  Nursing note and vitals reviewed.   ED Course  Procedures (including critical care time) Labs Review Labs Reviewed  BASIC METABOLIC PANEL - Abnormal; Notable for the following:    Glucose, Bld 117 (*)    GFR calc non Af Amer 79 (*)    All other components within normal limits  CBC WITH DIFFERENTIAL - Abnormal; Notable for the following:    Neutrophils Relative % 85 (*)    Lymphocytes Relative 10 (*)    All other components within normal limits  PROTIME-INR - Abnormal; Notable for the following:    Prothrombin Time 32.6 (*)    INR 3.15 (*)    All other components within normal limits    Imaging Review Dg Wrist Complete Right  04/26/2014   CLINICAL DATA:  Lump lung distal ulna. Fall, distal ulnar pain at right wrist. Initial encounter  EXAM: RIGHT WRIST - COMPLETE 3+ VIEW  COMPARISON:  None.  FINDINGS: Soft tissue  swelling noted over the distal ulna. Changes within the visualized distal ulnar shaft related to old healed fracture. There appears to be evidence of old, removed hardware.  Nondisplaced fracture noted through the distal radius, best seen at the base of the radial styloid. No additional acute fracture. No subluxation or dislocation.  IMPRESSION: Nondisplaced distal radial metaphyseal fracture.  Old healed ulnar shaft fracture   Electronically Signed   By: Rolm Baptise M.D.   On: 04/26/2014 13:08   Dg Femur Right  04/26/2014   CLINICAL DATA:  Fall, midshaft right femur pain.  Initial encounter  EXAM: RIGHT FEMUR - 2 VIEW  COMPARISON:  None.  FINDINGS: There is a comminuted, displaced and angulated fractures through the right femoral shaft extending into  the metaphysis. The distal fragment is displaced 1 shaft with medially relative to the proximal shaft. Mild apex anterior angulation.  IMPRESSION: Comminuted, displaced and angulated spiral fracture within the mid to distal shaft of the right femur extending into the distal metaphysis.   Electronically Signed   By: Rolm Baptise M.D.   On: 04/26/2014 13:10     EKG Interpretation   Date/Time:  Wednesday April 26 2014 12:20:06 EST Ventricular Rate:  92 PR Interval:    QRS Duration: 77 QT Interval:  386 QTC Calculation: 477 R Axis:   81 Text Interpretation:  Atrial fibrillation Borderline right axis deviation  Baseline wander in lead(s) V3 V6 Confirmed by Lacinda Axon  MD, Ting Cage ([id number]) on  04/26/2014 12:43:42 PM      MDM   Final diagnoses:  Pain in right femur  Pain  Fracture of right femur, closed, initial encounter  Fracture of right radius, closed, initial encounter    Plain films of right femur reveal a comminuted displaced angulated spiral fracture midshaft.   Pain films of right wrist show a nondisplaced distal radial fracture. Pain management. Discussed with Dr. Berenice Primas.  Admit    Nat Christen, MD 04/26/14 1538

## 2014-04-27 ENCOUNTER — Encounter (HOSPITAL_COMMUNITY): Payer: Self-pay | Admitting: Orthopedic Surgery

## 2014-04-27 LAB — PROTIME-INR
INR: 4.01 — AB (ref 0.00–1.49)
Prothrombin Time: 39.4 seconds — ABNORMAL HIGH (ref 11.6–15.2)

## 2014-04-27 LAB — CBC
HEMATOCRIT: 26.4 % — AB (ref 36.0–46.0)
Hemoglobin: 8.5 g/dL — ABNORMAL LOW (ref 12.0–15.0)
MCH: 27.7 pg (ref 26.0–34.0)
MCHC: 32.2 g/dL (ref 30.0–36.0)
MCV: 86 fL (ref 78.0–100.0)
Platelets: 149 10*3/uL — ABNORMAL LOW (ref 150–400)
RBC: 3.07 MIL/uL — ABNORMAL LOW (ref 3.87–5.11)
RDW: 14.4 % (ref 11.5–15.5)
WBC: 9.9 10*3/uL (ref 4.0–10.5)

## 2014-04-27 MED ORDER — WARFARIN SODIUM 1 MG PO TABS
1.0000 mg | ORAL_TABLET | Freq: Once | ORAL | Status: AC
  Administered 2014-04-27: 1 mg via ORAL
  Filled 2014-04-27: qty 1

## 2014-04-27 MED ORDER — WARFARIN SODIUM 1 MG PO TABS
1.0000 mg | ORAL_TABLET | Freq: Once | ORAL | Status: AC
  Filled 2014-04-27: qty 1

## 2014-04-27 NOTE — Progress Notes (Signed)
Pt a/o, no c/o pain, CMS on right hand and leg WNL, dressings C/D/I, pt resting comfortably, pt stable

## 2014-04-27 NOTE — Evaluation (Signed)
Physical Therapy Evaluation Patient Details Name: [name] MRN: [id number] DOB: [birth year] Today's Date: 04/27/2014   History of Present Illness  Patient is a 58 y/o female with history of osteoporosis who fell earlier today (11/4) and suffered right wrist and femur fractures. S/p IM nail right femur and ORIF right wrist POD 1. WBAT RUE, TDWB RLE. PMH of MVR, chronic A-fib, HTN , pulmonary HTN and rheumatic heart disease.     Clinical Impression  Patient presents with functional limitations due to above surgeries. Pt with generalized weakness and balance deficits due to weight bearing statuses of RUE/LE impacting safe mobility. Pt tolerated transferring to [facility] and gait training today with Min A for safety. Education provided on performing LE exercises on BLEs and AROM of RUE to improve swelling and strength. Pt will has 24/7 supervision at home. Pt would benefit from acute PT to improve transfers, gait, balance and mobility so pt can maximize independence and minimize risk of falls prior to return home.      Follow Up Recommendations Home health PT;Supervision/Assistance - 24 hour    Equipment Recommendations  Other (comment);3in1 (PT) (Needs RUE platform (owns RW at home).)    Recommendations for Other Services OT consult     Precautions / Restrictions Precautions Precautions: Fall Restrictions Weight Bearing Restrictions: Yes RUE Weight Bearing: Weight bearing as tolerated RLE Weight Bearing:  (TDWB)      Mobility  Bed Mobility               General bed mobility comments: Received sitting in chair upon PT arrival.   Transfers Overall transfer level: Needs assistance Equipment used: Rolling walker (2 wheeled) Transfers: Sit to/from [facility] Sit to Stand: Min assist Stand pivot transfers: Min assist       General transfer comment: Min A to rise and for steadying/balance due to TDWB RLE. SPT chair<->BSC Min A for balance/safety. Compliant  with WB status.  Ambulation/Gait Ambulation/Gait assistance: Min assist;Min guard Ambulation Distance (Feet): 75 Feet Assistive device: Rolling walker (2 wheeled) Gait Pattern/deviations: Step-to pattern;Decreased stride length;Trunk flexed Gait velocity: slow Gait velocity interpretation: Below normal speed for age/gender General Gait Details: VC's to take smaller steps and to decrease speed for energy conservation. Dyspnea present. HR increased to 128 bpm. Unsteady due to TDWB RLE. Used platform walker with WBAT through RUE. Fatigues quickly.   Stairs            Wheelchair Mobility    Modified Rankin (Stroke Patients Only)       Balance Overall balance assessment: Needs assistance   Sitting balance-Leahy Scale: Fair     Standing balance support: During functional activity Standing balance-Leahy Scale: Poor Standing balance comment: Requires BUE support on RW for balance/safety. RUE on platform.                             Pertinent Vitals/Pain Pain Assessment: 0-10 Pain Score: 9  Pain Location: right hip Pain Descriptors / Indicators: Sore;Aching Pain Intervention(s): Limited activity within patient's tolerance;Premedicated before session;Monitored during session;Repositioned    Home Living Family/patient expects to be discharged to:: Private residence Living Arrangements: Other relatives;Parent (Cousin and father) Available Help at Discharge: Family;Available 24 hours/day Type of Home: House Home Access: Ramped entrance     Home Layout: One level Home Equipment: Walker - 2 wheels;Wheelchair - manual;Toilet riser      Prior Function Level of Independence: Independent  Hand Dominance        Extremity/Trunk Assessment   Upper Extremity Assessment: Defer to OT evaluation;RUE deficits/detail RUE Deficits / Details: Shoulder, elbow AROM WFL. Edema present all digits.         Lower Extremity Assessment: RLE  deficits/detail;Generalized weakness RLE Deficits / Details: Limited AROM hip flexion, knee flexion. Able to extend R knee with increased time. Ankle AROM WFL.        Communication   Communication: No difficulties  Cognition Arousal/Alertness: Awake/alert Behavior During Therapy: WFL for tasks assessed/performed Overall Cognitive Status: Within Functional Limits for tasks assessed                      General Comments General comments (skin integrity, edema, etc.): Surgical site on right hip - clean, dry and intact. Edema present right digits. Encouraged 2-3 pillows to elevate RUE.    Exercises        Assessment/Plan    PT Assessment Patient needs continued PT services  PT Diagnosis Difficulty walking;Generalized weakness;Acute pain   PT Problem List Decreased strength;Pain;Decreased range of motion;Decreased activity tolerance;Decreased knowledge of use of DME;Decreased balance;Decreased safety awareness;Decreased mobility  PT Treatment Interventions Balance training;Gait training;Patient/family education;Functional mobility training;Therapeutic activities;Therapeutic exercise;Wheelchair mobility training;DME instruction;Neuromuscular re-education   PT Goals (Current goals can be found in the Care Plan section) Acute Rehab PT Goals Patient Stated Goal: to get better so I can go home PT Goal Formulation: With patient Time For Goal Achievement: 05/11/14 Potential to Achieve Goals: Good    Frequency Min 3X/week   Barriers to discharge        Co-evaluation               End of Session Equipment Utilized During Treatment: Gait belt Activity Tolerance: Patient limited by fatigue;Patient limited by pain Patient left: in chair;with call bell/phone within reach Nurse Communication: Mobility status;Precautions;Weight bearing status         Time: 9935-7017 PT Time Calculation (min): 25 min   Charges:   PT Evaluation $Initial PT Evaluation Tier I: 1  Procedure PT Treatments $Gait Training: 8-22 mins   PT G CodesCandy Sledge A 04/27/2014, 3:47 PM  Candy Sledge, [place], DPT [phone]

## 2014-04-27 NOTE — Progress Notes (Signed)
Pt c/o pain at 0400 PRN percocet given with good affect, pt resting comfortably, pt stable

## 2014-04-27 NOTE — Progress Notes (Signed)
ANTICOAGULATION CONSULT NOTE - Follow Up Consult  Pharmacy Consult:  Coumadin Indication:  Afib + history of MVR  No Known Allergies  Patient Measurements: Height: 5\' 3"  (160 cm) Weight: 176 lb (79.833 kg) IBW/kg (Calculated) : 52.4  Vital Signs: Temp: 98.1 F (36.7 C) (11/05 0613) Temp Source: Oral (11/05 1610) BP: 106/55 mmHg (11/05 1015) Pulse Rate: 106 (11/05 1015)  Labs:  Recent Labs  04/26/14 1204 04/26/14 1739 04/26/14 2207 04/27/14 0344  HGB 13.0 10.9* 9.1* 8.5*  HCT 40.2 32.0* 28.5* 26.4*  PLT 151  --  155 149*  LABPROT 32.6*  --   --  39.4*  INR 3.15*  --   --  4.01*  CREATININE 0.80  --  0.76  --     Estimated Creatinine Clearance: 74.8 mL/min (by C-G formula based on Cr of 0.76).     Assessment: 23 YOF to continue on Coumadin from PTA for history of Afib, history of mechanical AVR, and VTE prophylaxis s/p ORIF of wrist fracture.  INR increased to supra-therapeutic level today but will not hold today's dose given patient has a mechanical valve and I do not want her INR to drop significantly.  No bleeding reported.      Goal of Therapy:  INR 3 - 3.5 per patient    Plan:  - D/C standing Coumadin order - Coumadin 1mg  PO today - Daily PT / INR    Huong Luthi D. Mina Marble, PharmD, BCPS Pager:  [phone] 04/27/2014, 12:52 PM

## 2014-04-27 NOTE — Evaluation (Signed)
Occupational Therapy Evaluation and Discharge Patient Details Name: [name] MRN: [id number] DOB: [birth year] Today's Date: 04/27/2014    History of Present Illness Patient is a 58 y/o female with history of osteoporosis who fell earlier today (11/4) and suffered right wrist and femur fractures. S/p IM nail right femur and ORIF right wrist POD 1. WBAT RUE, TDWB RLE. PMH of MVR, chronic A-fib, HTN , pulmonary HTN and rheumatic heart disease.    Clinical Impression   This 58 yo female admitted and underwent above presents to acute OT with decreased balance, decreased mobility, increased pain, decreased WB'ing RUE and RLE all affecting her independent level as she was pta. We went through all my education on BADLs and I asked her if she felt she needed to practice anything, but she politely declined based on her prior Bil ankle fractures and having to move the same way then. No further acute OT needs, we will sign off.    Follow Up Recommendations  No OT follow up    Equipment Recommendations  None recommended by OT       Precautions / Restrictions Precautions Precautions: Fall Restrictions Weight Bearing Restrictions: Yes RUE Weight Bearing: Weight bearing as tolerated (with PFRW) RLE Weight Bearing: Touchdown weight bearing              ADL Overall ADL's : Needs assistance/impaired                                       General ADL Comments: Pt reports that in 2014 she broke both ankles and had to wear boots,putting weight through her LLE and no weight through RLE (which is what she has to do now)--that is why she has all the DME already at home except for the right platform for the RW. Pt reports her shower is big enough to walk into and turn around with her PFRW--I made her aware then that she needs to put her RLE forward before hoping forward over into shower stall and also when hopping out of shower stall. She is aware to wear t-shirts and any LB pants  need to be elastic waist. She is aware to keep arm and leg dry, she said a friend of hers had a short arm cast cover and would that be ok to use for showering--I told her yes. I advised to use Press-n-seal for her leg taping at the top or ask her surgeon tomorrow about water proof bandages before she goes home.               Pertinent Vitals/Pain Pain Assessment:  (It hurts but I am OK) Pain Score: 9  Pain Location: right hip and right arm Pain Descriptors / Indicators: Sore;Aching Pain Intervention(s): Limited activity within patient's tolerance;Premedicated before session;Monitored during session;Repositioned     Hand Dominance Right   Extremity/Trunk Assessment Upper Extremity Assessment Upper Extremity Assessment: RUE deficits/detail RUE Deficits / Details: Shoulder, elbow AROM WFL. Edema in digits however has full AROM just tight   Lower Extremity Assessment Lower Extremity Assessment: RLE deficits/detail;Generalized weakness RLE Deficits / Details: Limited AROM hip flexion, knee flexion. Able to extend R knee with increased time. Ankle AROM WFL.  RLE: Unable to fully assess due to pain       Communication Communication Communication: No difficulties   Cognition Arousal/Alertness: Awake/alert Behavior During Therapy: WFL for tasks assessed/performed Overall Cognitive Status: Within Functional Limits  for tasks assessed                        Exercises   Other Exercises Other Exercises: explained to pt that she needs to keep RUE propped (correct when I entered the room with hand higher than elbow) and that she needs to work on opening and closing her hand all the way as well as punching up towards the ceiling so keep her shoulder from getting tight keeping her arm  in prolonged positions        Home Living Family/patient expects to be discharged to:: Private residence Living Arrangements: Other relatives;Parent (cousin and father) Available Help at Discharge:  Family;Available 24 hours/day Type of Home: House Home Access: Ramped entrance     Home Layout: One level     Bathroom Shower/Tub: Walk-in shower;Door   [employer] Toilet: Standard     Home Equipment: Environmental consultant - 2 wheels;Wheelchair - manual;Toilet riser;Shower seat - built in          Prior Functioning/Environment Level of Independence: Independent             OT Diagnosis: Generalized weakness;Acute pain   OT Problem List: Decreased strength;Decreased range of motion;Impaired balance (sitting and/or standing);Pain      OT Goals(Current goals can be found in the care plan section) Acute Rehab OT Goals Patient Stated Goal: to go home hopefully tomorrow  OT Frequency:                End of Session    Activity Tolerance: Patient tolerated treatment well Patient left: in chair;with call bell/phone within reach   Time: 1601-1611 OT Time Calculation (min): 10 min Charges:  OT General Charges $OT Visit: 1 Procedure OT Evaluation $Initial OT Evaluation Tier I: 1 Procedure  Almon Register [phone] 04/27/2014, 5:11 PM

## 2014-04-27 NOTE — Brief Op Note (Signed)
04/26/2014  7:56 AM  PATIENT:  [name]  58 y.o. female  PRE-OPERATIVE DIAGNOSIS:  fractured right femur, fractured right wrist  POST-OPERATIVE DIAGNOSIS:  fractured right femur, fractured right wrist  PROCEDURE:  Procedure(s): INTRAMEDULLARY (IM) NAIL FEMORAL (Right) OPEN REDUCTION INTERNAL FIXATION (ORIF) WRIST FRACTURE (Right)  SURGEON:  Surgeon(s) and Role:    * Alta Corning, MD - Primary  PHYSICIAN ASSISTANT:   ASSISTANTS: bethune   ANESTHESIA:   general  EBL:     BLOOD ADMINISTERED:none  DRAINS: none   LOCAL MEDICATIONS USED:  MARCAINE     SPECIMEN:  No Specimen  DISPOSITION OF SPECIMEN:  N/A  COUNTS:  YES  TOURNIQUET:   Total Tourniquet Time Documented: Upper Arm (Right) - 48 minutes Total: Upper Arm (Right) - 48 minutes   DICTATION: .Other Dictation: Dictation Number [id number]  PLAN OF CARE: Admit to inpatient   PATIENT DISPOSITION:  PACU - hemodynamically stable.   Delay start of Pharmacological VTE agent (>24hrs) due to surgical blood loss or risk of bleeding: no

## 2014-04-27 NOTE — Op Note (Signed)
[name], [name]              ACCOUNT NO.:  [id number]  MEDICAL RECORD NO.:  [id number]  LOCATION:  6N10C                        FACILITY:  [facility]  PHYSICIAN:  Alta Corning, M.D.   DATE OF BIRTH:  05/16/1956  DATE OF PROCEDURE:  04/27/2014 DATE OF DISCHARGE:                              OPERATIVE REPORT   PREOPERATIVE DIAGNOSES:  Intra-articular distal radius fracture right and comminuted distal quarter femur fracture right.  POSTOPERATIVE DIAGNOSES:  Intra-articular distal radius fracture right and comminuted distal quarter femur fracture right.  PRINCIPAL PROCEDURE: 1. Intramedullary rod fixation of right comminuted distal femur     fracture. 2. Open reduction and internal fixation of distal radius fracture with     DVR plate. 3. Interpretation of multiple intraoperative fluoroscopic images,     right femur. 4. Interpretation of multiple intraoperative fluoroscopic images,     right wrist.  SURGEON:  Alta Corning, M.D.  ASSISTANT:  Gary Fleet, PA-C.  ANESTHESIA:  General.  BRIEF HISTORY:  [name] is a 58 year old female who was walking and fell.  She heard a pop in her leg and fell on her hand.  She unfortunately presented to the emergency room, where she was noted to have a distal femur fracture comminuted and distal radius fracture intra- articular.  We had a long talk with her about treatment options. Clearly, she needed the femur fixed and we were concerned with the inability of mobilization and weightbearing affixing the radius was appropriate.  She was brought to the operating room for these procedures.  DESCRIPTION OF PROCEDURE:  The patient was brought to the operating room, after adequate anesthesia was obtained with general anesthetic, the patient was placed supine on the operating table.  The patient was then placed on the fracture table and the right leg was prepped and draped in the usual sterile fashion.  Following this, multiple  spot images were taken in the fluoro and a small incision was made proximal to the trochanter. A guidewire was then placed in the central portion of the greater trochanter under fluoroscopic imaging and it was opened and a guidewire was advanced down the femur sequentially reamed to a level of 12, 5 and an 11 rod was sent down the femur and locked proximally with up and down screws that went into the femoral neck because of her obvious osteoporosis just to give some rebar into the femoral neck and increased fixation.  Distally, we did 2 interlocking screws with freehand technique.  We did line up the fracture near anatomic, although we could not get it completely perfect because of the severe comminution and butterfly pieces.  At this time, attention was turned towards the right wrist were after all this time all the drapes were taken down and sterile compressive dressing was applied.  She was moved on the bed and then hand board was placed.  We did a separate prep and drape of the right wrist. Once this was done, the arm was exsanguinated, blood pressure tourniquet was inflated to 250 mmHg.  A small incision was made in the FCR tendon.  Floor of this was exploited and we went down to the fractured radius.  A DVR  plate was placed in standard fashion after manipulative reduction of the wrist. A fluoroscopic images were used to make sure were adequate position.  Once this was done, the wound was irrigated and suctioned dry.  The pronator quadratus was repaired with 4- 0 Vicryl.  The skin with 0 and 2-0 Vicryl and 3-0 Monocryl subcuticular. Benzoin and Steri-Strips were applied.  Sterile compressive dressing was applied and the patient was taken to the recovery, she was noted to be in satisfactory condition.  Estimated blood loss for the procedure was 400 mL for the femur and none for the wrist.  PREOPERATIVE DISCUSSION:  The patient was noted to have a high INR of 3.5 prior to surgery.  We  had a long thoughtful process feeling that risk was greater to bleed into the leg with the elevated INR, so even with the elevated INR we felt that there was less risk for the patient to proceed with surgery to line up the femur so that we could minimize the bleeding into the leg.  We discussed this with the patient and her family and they understood the increased risks associated with surgery, but we felt like it was mitigating what could be a greater risk.     Alta Corning, M.D.     Corliss Skains  D:  04/27/2014  T:  04/27/2014  Job:  [id number]

## 2014-04-27 NOTE — Progress Notes (Signed)
Subjective: 1 Day Post-Op Procedure(s) (LRB): INTRAMEDULLARY (IM) NAIL FEMORAL (Right) OPEN REDUCTION INTERNAL FIXATION (ORIF) WRIST FRACTURE (Right) Patient reports pain as mild.    Objective: Vital signs in last 24 hours: Temp:  [97.6 F (36.4 C)-98.4 F (36.9 C)] 98.1 F (36.7 C) (11/05 1610) Pulse Rate:  [56-111] 95 (11/05 0613) Resp:  [10-21] 12 (11/05 9604) BP: (95-158)/(56-92) 95/67 mmHg (11/05 0613) SpO2:  [95 %-100 %] 100 % (11/05 5409) Weight:  [176 lb (79.833 kg)] 176 lb (79.833 kg) (11/04 2049)  Intake/Output from previous day: 11/04 0701 - 11/05 0700 In: 2275 [I.V.:2025; IV Piggyback:250] Out: 650 [Urine:250; Blood:400] Intake/Output this shift:     Recent Labs  04/26/14 1204 04/26/14 1739 04/26/14 2207 04/27/14 0344  HGB 13.0 10.9* 9.1* 8.5*    Recent Labs  04/26/14 2207 04/27/14 0344  WBC 12.5* 9.9  RBC 3.34* 3.07*  HCT 28.5* 26.4*  PLT 155 149*    Recent Labs  04/26/14 1204 04/26/14 1739 04/26/14 2207  NA 138 138 137  K 4.2 4.3 4.3  CL 103  --  104  CO2 23  --  23  BUN 10  --  11  CREATININE 0.80  --  0.76  GLUCOSE 117* 115* 165*  CALCIUM 8.9  --  7.9*    Recent Labs  04/26/14 1204 04/27/14 0344  INR 3.15* 4.01*    Neurologically intact ABD soft Neurovascular intact Sensation intact distally Intact pulses distally Dorsiflexion/Plantar flexion intact No cellulitis present Compartment soft  Assessment/Plan: 1 Day Post-Op Procedure(s) (LRB): INTRAMEDULLARY (IM) NAIL FEMORAL (Right) OPEN REDUCTION INTERNAL FIXATION (ORIF) WRIST FRACTURE (Right)   Advance diet Up with therapy Plan for discharge tomorrow Discharge home with home health  Acute blood loss anemia/Monitor blood counts but will not transfuse unless symptomatic  Taran Haynesworth L 04/27/2014, 7:50 AM

## 2014-04-27 NOTE — Plan of Care (Signed)
Problem: Phase I Progression Outcomes Goal: Pain controlled with appropriate interventions Outcome: Completed/Met Date Met:  04/27/14 Goal: Vital signs/hemodynamically stable Outcome: Completed/Met Date Met:  04/27/14 Goal: Other Phase I Outcomes/Goals Outcome: Not Applicable Date Met:  25/05/39

## 2014-04-28 DIAGNOSIS — D62 Acute posthemorrhagic anemia: Secondary | ICD-10-CM

## 2014-04-28 LAB — CBC
HCT: 21.4 % — ABNORMAL LOW (ref 36.0–46.0)
HEMOGLOBIN: 7.2 g/dL — AB (ref 12.0–15.0)
MCH: 29.4 pg (ref 26.0–34.0)
MCHC: 33.6 g/dL (ref 30.0–36.0)
MCV: 87.3 fL (ref 78.0–100.0)
Platelets: 131 10*3/uL — ABNORMAL LOW (ref 150–400)
RBC: 2.45 MIL/uL — ABNORMAL LOW (ref 3.87–5.11)
RDW: 14.6 % (ref 11.5–15.5)
WBC: 8.2 10*3/uL (ref 4.0–10.5)

## 2014-04-28 LAB — PROTIME-INR
INR: 6.96 — AB (ref 0.00–1.49)
PROTHROMBIN TIME: 60.5 s — AB (ref 11.6–15.2)

## 2014-04-28 LAB — PREPARE RBC (CROSSMATCH)

## 2014-04-28 MED ORDER — FUROSEMIDE 10 MG/ML IJ SOLN
20.0000 mg | Freq: Once | INTRAMUSCULAR | Status: AC
Start: 2014-04-28 — End: 2014-04-28
  Administered 2014-04-28: 20 mg via INTRAVENOUS
  Filled 2014-04-28: qty 2

## 2014-04-28 MED ORDER — SODIUM CHLORIDE 0.9 % IV SOLN
Freq: Once | INTRAVENOUS | Status: AC
  Administered 2014-04-28: 10 mL/h via INTRAVENOUS

## 2014-04-28 MED ORDER — FERROUS SULFATE 325 (65 FE) MG PO TABS: 325.0000 mg | ORAL_TABLET | Freq: Two times a day (BID) | ORAL | Status: DC

## 2014-04-28 NOTE — Progress Notes (Addendum)
Subjective: 2 Days Post-OpProcedure(s) (LRB): INTRAMEDULLARY (IM) NAIL FEMORAL (Right) OPEN REDUCTION INTERNAL FIXATION (ORIF) WRIST FRACTURE (Right) Patientreportspainasmoderate.Somedizziness.Takingpo/voidingok.  Objective: Vitalsignsinlast24hours: Temp:  [97.9 F (36.6 C)-99 F (37.2 C)] 98.3 F (36.8 C) (11/06 1345) Pulse Rate:  [104-123] 116 (11/06 1345) Resp:  [15-16] 16 (11/06 1345) BP: (91-112)/(49-65) 91/65 mmHg (11/06 1326) SpO2:  [94 %-98 %] 98 % (11/06 1345)  Intake/Outputfrompreviousday: 11/05 0701 - 11/06 0700 In: 1920 [P.O.:720; I.V.:1200] Out: -  Intake/Outputthisshift: Total I/O In: 600 [P.O.:600] Out: -    Recent Labs  04/26/14 1204 04/26/14 1739 04/26/14 2207 04/27/14 0344 04/28/14 0422  HGB 13.0 10.9* 9.1* 8.5* 7.2*    Recent Labs  04/27/14 0344 04/28/14 0422  WBC 9.9 8.2  RBC 3.07* 2.45*  HCT 26.4* 21.4*  PLT 149* 131*    Recent Labs  04/26/14 1204 04/26/14 1739 04/26/14 2207  NA 138 138 137  K 4.2 4.3 4.3  CL 103  --  104  CO2 23  --  23  BUN 10  --  11  CREATININE 0.80  --  0.76  GLUCOSE 117* 115* 165*  CALCIUM 8.9  --  7.9*    Recent Labs  04/27/14 0344 04/28/14 0422  INR 4.01* 6.96*  Rightupperextexam: Volarsplintintact.N-Vintactouttofingers Rightlowerextexam: Significantrightthighswellingbutnosignofcompartmentsyndrome.Distalsensationandpulsesintact. Nosignificantswellingdistally.Calfsoftandnontender.    Assessment/Plan: 2 Days Post-OpProcedure(s) (LRB): INTRAMEDULLARY (IM) NAIL FEMORAL (Right) OPEN REDUCTION INTERNAL FIXATION (ORIF) WRIST FRACTURE (Right) Acutebloodlossanemiasymptomaticduetoptoncoumadinformechanicalheartvalve. Plan: Icetorightthigh Transfuse2unitspRBC'stoday Watchrightthighswelling. CheckCBCinAM ContPTTDWBonrightLEandWBATrightupperext. OKtodischargehomeSatifhgbokandprogressingwithPT. Hold coumadin tonight.  BETHUNE,JAMES G 04/28/2014,3:56 PM

## 2014-04-28 NOTE — Care Management Note (Signed)
  Page 1 of 1   04/28/2014     1:41:15 PM CARE MANAGEMENT NOTE 04/28/2014  Patient:  [name], [name]   Account Number:  [id number]  Date Initiated:  04/28/2014  Documentation initiated by:  Magdalen Spatz  Subjective/Objective Assessment:     Action/Plan:   Anticipated DC Date:     Anticipated DC Plan:  [place]         Choice offered to / List presented to:  C-1 Patient   DME arranged  3-N-1      DME agency  [place] arranged  [place]   Status of service:   Medicare Important Message given?   (If response is "NO", the following Medicare IM given date fields will be blank) Date Medicare IM given:   Medicare IM given by:   Date Additional Medicare IM given:   Additional Medicare IM given by:    Discharge Disposition:    Per UR Regulation:    If discussed at Long Length of Stay Meetings, dates discussed:    Comments:  04-27-14 Confirmed face sheet information with patient. Pat[place] received rolling walker in May of 2014 , needs RUE platform attachment , same ordered through [place] at [facility] . Magdalen Spatz RN BSN [phone]

## 2014-04-28 NOTE — Progress Notes (Signed)
ANTICOAGULATION CONSULT NOTE - Follow Up Consult  Pharmacy Consult:  Coumadin Indication:  Afib + history of MVR  No Known Allergies  Patient Measurements: Height: 5\' 3"  (160 cm) Weight: 176 lb (79.833 kg) IBW/kg (Calculated) : 52.4  Vital Signs: Temp: 98.4 F (36.9 C) (11/06 0523) Temp Source: Oral (11/06 0523) BP: 111/50 mmHg (11/06 0523) Pulse Rate: 123 (11/06 0523)  Labs:  Recent Labs  04/26/14 1204  04/26/14 2207 04/27/14 0344 04/28/14 0422  HGB 13.0  < > 9.1* 8.5* 7.2*  HCT 40.2  < > 28.5* 26.4* 21.4*  PLT 151  --  155 149* 131*  LABPROT 32.6*  --   --  39.4* 60.5*  INR 3.15*  --   --  4.01* 6.96*  CREATININE 0.80  --  0.76  --   --   < > = values in this interval not displayed.  Estimated Creatinine Clearance: 74.8 mL/min (by C-G formula based on Cr of 0.76).  Assessment: 2 YOF to continue on Coumadin from PTA for history of Afib, history of mechanical AVR, and VTE prophylaxis s/p ORIF of wrist fracture.  INR further increased despite very low dose yesterday. No bleeding noted per RN. Hgb is decreasing.  Goal of Therapy:  INR 3 - 3.5 per patient   Plan:  1. No coumadin tonight 2. F/u AM INR  Salome Arnt, PharmD, BCPS Pager # [phone] 04/28/2014 10:33 AM

## 2014-04-28 NOTE — Progress Notes (Signed)
Rehab admissions - I met with pt to share that rehab team had examined pt's case and feels that home with home health is most appropriate discharge plan as pt does not need the intensity of acute inpatient rehab. Pt was understanding of this recommendation and shared that her cousin, father and sister will be helping her at home. In addition, pt plans to have follow up home health services.  I called and updated Heather, case manager who was already aware of the plan for home with home health.  Thanks.  Nanetta Batty, PT Rehabilitation Admissions Coordinator [phone]

## 2014-04-28 NOTE — Progress Notes (Signed)
CRITICAL VALUE ALERT  Critical value received:  Pt 60.5 & INR 6.96  Date of notification:  04/28/14  Time of notification:0525  Critical value read back:Yes.    Nurse who received alert: Deeann Dowse RN  MD notified (1st page):  Pharmacy notified due to they are dosing the coumadin  Time of first page: 0540  MD notified (2nd page):  Time of second page:  Responding MD: Wynona Neat pharmacist stated to monitor patient for bleeding.  Time MD responded: [phone]

## 2014-04-28 NOTE — Progress Notes (Signed)
PT Cancellation Note  Patient Details Name: DEVIKA DRAGOVICH MRN: [id number] DOB: March 21, 1956   Cancelled Treatment:    Reason Eval/Treat Not Completed: Pain limiting ability to participate;Fatigue/lethargy limiting ability to participate;Medical issues which prohibited therapy.  Attempted to see patient x2 today.  Patient receiving blood.  Patient reports she was OOB most of morning and is painful and fatigued.  Will return tomorrow for PT session.   Despina Pole 04/28/2014, 6:22 PM Carita Pian. Sanjuana Kava, [place] Pager [phone]

## 2014-04-28 NOTE — Plan of Care (Signed)
Problem: Phase I Progression Outcomes Goal: Incision/dressings dry and intact Outcome: Completed/Met Date Met:  04/28/14  Problem: Phase II Progression Outcomes Goal: Pain controlled Outcome: Completed/Met Date Met:  04/28/14 Goal: Vital signs stable Outcome: Completed/Met Date Met:  04/28/14 Goal: Dressings dry/intact Outcome: Completed/Met Date Met:  04/28/14 Goal: Tolerating diet Outcome: Completed/Met Date Met:  04/28/14

## 2014-04-28 NOTE — Progress Notes (Signed)
Physical medicine and rehabilitation consult requested with chart reviewed. Patient postop right comminuted distal femur fracture as well as right radius fracture. Physical and occupational therapy evaluations completed 04/27/2014 with recommendations of home health therapies as patient is progressing quite nicely ambulating min assist level and assistance to be provided by family at home. Patient will not need inpatient rehabilitation services. Hold on formal rehabilitation consult with this time with recommendations of discharge to home

## 2014-04-29 LAB — TYPE AND SCREEN
ABO/RH(D): A NEG
ANTIBODY SCREEN: POSITIVE
DAT, IGG: NEGATIVE
DONOR AG TYPE: NEGATIVE
DONOR AG TYPE: NEGATIVE
Unit division: 0
Unit division: 0

## 2014-04-29 LAB — CBC
HCT: 25.5 % — ABNORMAL LOW (ref 36.0–46.0)
Hemoglobin: 8.5 g/dL — ABNORMAL LOW (ref 12.0–15.0)
MCH: 29.1 pg (ref 26.0–34.0)
MCHC: 33.3 g/dL (ref 30.0–36.0)
MCV: 87.3 fL (ref 78.0–100.0)
Platelets: 121 10*3/uL — ABNORMAL LOW (ref 150–400)
RBC: 2.92 MIL/uL — ABNORMAL LOW (ref 3.87–5.11)
RDW: 14.4 % (ref 11.5–15.5)
WBC: 8.1 10*3/uL (ref 4.0–10.5)

## 2014-04-29 LAB — PROTIME-INR
INR: 4.39 — ABNORMAL HIGH (ref 0.00–1.49)
Prothrombin Time: 42.3 seconds — ABNORMAL HIGH (ref 11.6–15.2)

## 2014-04-29 NOTE — Progress Notes (Signed)
Subjective: 3 Days Post-Op Procedure(s) (LRB): INTRAMEDULLARY (IM) NAIL FEMORAL (Right) OPEN REDUCTION INTERNAL FIXATION (ORIF) WRIST FRACTURE (Right)   Patient stats that she is feeling a little better this morning but still a little tired and weak.She stats the the pain meds are controlling her pain well.    Activity level:  R LE touchdown weightbearing, R wrist WBAT Diet tolerance:  ok Voiding:  ok Patient reports pain as mild and moderate.    Objective: Vital signs in last 24 hours: Temp:  [97.9 F (36.6 C)-99.1 F (37.3 C)] 98.2 F (36.8 C) (11/07 0534) Pulse Rate:  [89-120] 111 (11/07 0534) Resp:  [16-20] 18 (11/07 0534) BP: (89-130)/(54-81) 130/57 mmHg (11/07 0534) SpO2:  [94 %-98 %] 95 % (11/07 0534)  Labs:  Recent Labs  04/26/14 1739 04/26/14 2207 04/27/14 0344 04/28/14 0422 04/29/14 0404  HGB 10.9* 9.1* 8.5* 7.2* 8.5*    Recent Labs  04/28/14 0422 04/29/14 0404  WBC 8.2 8.1  RBC 2.45* 2.92*  HCT 21.4* 25.5*  PLT 131* 121*    Recent Labs  04/26/14 1204 04/26/14 1739 04/26/14 2207  NA 138 138 137  K 4.2 4.3 4.3  CL 103  --  104  CO2 23  --  23  BUN 10  --  11  CREATININE 0.80  --  0.76  GLUCOSE 117* 115* 165*  CALCIUM 8.9  --  7.9*    Recent Labs  04/28/14 0422 04/29/14 0404  INR 6.96* 4.39*    Physical Exam:  Neurologically intact ABD soft Neurovascular intact Sensation intact distally Intact pulses distally Dorsiflexion/Plantar flexion intact No cellulitis present Compartment soft  Assessment/Plan:  3 Days Post-Op Procedure(s) (LRB): INTRAMEDULLARY (IM) NAIL FEMORAL (Right) OPEN REDUCTION INTERNAL FIXATION (ORIF) WRIST FRACTURE (Right) Advance diet Up with therapy Plan for discharge tomorrow Discharge home with home health if labs continuing to trend in the right direction and she continues to feel well. Continue Ice to right thigh. Recheck CBC and INR in the morning. Continue PTTDWB on Right LE and WBAT on Right  UE. Hold coumadin tonight.     Dhiya Smits, Larwance Sachs 04/29/2014, 8:05 AM

## 2014-04-29 NOTE — Progress Notes (Signed)
ANTICOAGULATION CONSULT NOTE - Follow Up Consult  Pharmacy Consult:  Coumadin Indication:  Afib + history of MVR  No Known Allergies  Patient Measurements: Height: 5\' 3"  (160 cm) Weight: 176 lb (79.833 kg) IBW/kg (Calculated) : 52.4  Vital Signs: Temp: 98.2 F (36.8 C) (11/07 0534) Temp Source: Oral (11/07 0534) BP: 130/57 mmHg (11/07 0534) Pulse Rate: 111 (11/07 0534)  Labs:  Recent Labs  04/26/14 1204  04/26/14 2207 04/27/14 0344 04/28/14 0422 04/29/14 0404  HGB 13.0  < > 9.1* 8.5* 7.2* 8.5*  HCT 40.2  < > 28.5* 26.4* 21.4* 25.5*  PLT 151  --  155 149* 131* 121*  LABPROT 32.6*  --   --  39.4* 60.5* 42.3*  INR 3.15*  --   --  4.01* 6.96* 4.39*  CREATININE 0.80  --  0.76  --   --   --   < > = values in this interval not displayed.  Estimated Creatinine Clearance: 74.8 mL/min (by C-G formula based on Cr of 0.76).      Assessment: [name] to continue on Coumadin from PTA for history of Afib, history of mechanical AVR, and VTE prophylaxis s/p ORIF of wrist fracture.  INR trending down but remains elevated.  No bleeding reported.   Goal of Therapy:  INR 3 - 3.5 per patient    Plan:  - Continue to hold Coumadin today - Daily PT / INR    Armonee Bojanowski D. Mina Marble, PharmD, BCPS Pager:  [phone] 04/29/2014, 9:28 AM

## 2014-04-29 NOTE — Progress Notes (Signed)
Physical Therapy Treatment Patient Details Name: [name] MRN: [id number] DOB: 1955-09-15 Today's Date: 04/29/2014    History of Present Illness Patient is a 58 y/o female with history of osteoporosis who fell earlier today (11/4) and suffered right wrist and femur fractures. S/p IM nail right femur and ORIF right wrist POD 1. WBAT RUE, TDWB RLE. PMH of MVR, chronic A-fib, HTN , pulmonary HTN and rheumatic heart disease.     PT Comments    Patient able to participate with PT today.  Patient able to maintain TDWB RLE and use Rt platform RW safely.  Patient fatigues quickly, and continues to have increased pain.  Agree with need for HHPT at discharge. Spoke with patient about getting from car to inside house.  Patient reports they have a w/c and will use that to get inside home at discharge.   Follow Up Recommendations  Home health PT;Supervision/Assistance - 24 hour     Equipment Recommendations  3in1 (PT) (Rt platform)    Recommendations for Other Services OT consult     Precautions / Restrictions Precautions Precautions: Fall Restrictions Weight Bearing Restrictions: Yes RUE Weight Bearing: Weight bearing as tolerated (with platform walker) RLE Weight Bearing: Touchdown weight bearing    Mobility  Bed Mobility Overal bed mobility: Needs Assistance Bed Mobility: Supine to Sit;Sit to Supine     Supine to sit: Min assist Sit to supine: Min assist   General bed mobility comments: Verbal cues for technique.  Assist to move RLE off of bed.  Once upright, patient with good balance.  Required assist to bring BLE's onto bed to return to supine.  Transfers Overall transfer level: Needs assistance Equipment used: Right platform walker Transfers: Sit to/from Stand Sit to Stand: Min assist         General transfer comment: Patient able to use correct hand placement.  Assist to rise to standing and for balance.  Ambulation/Gait Ambulation/Gait assistance: Min  assist Ambulation Distance (Feet): 20 Feet Assistive device: Right platform walker Gait Pattern/deviations: Step-to pattern;Decreased stance time - left;Decreased step length - right;Decreased step length - left;Decreased weight shift to left;Antalgic Gait velocity: slow Gait velocity interpretation: Below normal speed for age/gender General Gait Details: Verbal cues for TDWB on RLE - patient able to maintain during gait.  Somewhat unsteady.  Fatigues quickly.   Stairs            Wheelchair Mobility    Modified Rankin (Stroke Patients Only)       Balance                                    Cognition Arousal/Alertness: Awake/alert Behavior During Therapy: WFL for tasks assessed/performed Overall Cognitive Status: Within Functional Limits for tasks assessed                      Exercises      General Comments        Pertinent Vitals/Pain Pain Assessment: 0-10 Pain Score: 8  Pain Location: Rt thigh/leg Pain Descriptors / Indicators: Aching;Tender;Sore Pain Intervention(s): Limited activity within patient's tolerance;Premedicated before session;Repositioned;Ice applied    Home Living                      Prior Function            PT Goals (current goals can now be found in the care plan section) Acute  Rehab PT Goals Patient Stated Goal: to go home hopefully tomorrow Progress towards PT goals: Progressing toward goals    Frequency  Min 3X/week    PT Plan Current plan remains appropriate    Co-evaluation             End of Session Equipment Utilized During Treatment: Gait belt Activity Tolerance: Patient limited by fatigue;Patient limited by pain Patient left: in bed;with call bell/phone within reach;with bed alarm set     Time: 1636-1700 PT Time Calculation (min): 24 min  Charges:  $Gait Training: 23-37 mins                    G Codes:      Despina Pole 05-15-14, 6:13 PM Carita Pian. Sanjuana Kava, [place] Pager [phone]

## 2014-04-30 LAB — CBC
HCT: 24.8 % — ABNORMAL LOW (ref 36.0–46.0)
HEMOGLOBIN: 8.2 g/dL — AB (ref 12.0–15.0)
MCH: 28.9 pg (ref 26.0–34.0)
MCHC: 33.1 g/dL (ref 30.0–36.0)
MCV: 87.3 fL (ref 78.0–100.0)
Platelets: 152 10*3/uL (ref 150–400)
RBC: 2.84 MIL/uL — ABNORMAL LOW (ref 3.87–5.11)
RDW: 14.5 % (ref 11.5–15.5)
WBC: 8.1 10*3/uL (ref 4.0–10.5)

## 2014-04-30 LAB — PROTIME-INR
INR: 2.64 — ABNORMAL HIGH (ref 0.00–1.49)
Prothrombin Time: 28.4 seconds — ABNORMAL HIGH (ref 11.6–15.2)

## 2014-04-30 MED ORDER — METHOCARBAMOL 750 MG PO TABS: 750.0000 mg | ORAL_TABLET | Freq: Four times a day (QID) | ORAL | Status: DC | PRN

## 2014-04-30 MED ORDER — OXYCODONE-ACETAMINOPHEN 5-325 MG PO TABS: 1.0000 | ORAL_TABLET | ORAL | Status: DC | PRN

## 2014-04-30 MED ORDER — WARFARIN SODIUM 7.5 MG PO TABS
7.5000 mg | ORAL_TABLET | Freq: Once | ORAL | Status: AC
  Administered 2014-04-30: 7.5 mg via ORAL
  Filled 2014-04-30 (×2): qty 1

## 2014-04-30 NOTE — Discharge Summary (Signed)
Patient ID: [name] MRN: [id number] DOB/AGE: [birth year] 58 y.o.  Admit date: 04/26/2014 Discharge date: 04/30/2014  Admission Diagnoses:  Principal Problem:   Femur fracture, right Active Problems:   Distal radius fracture, right   Right femoral fracture   Acute blood loss anemia   Discharge Diagnoses:  Same  Past Medical History  Diagnosis Date  . Rheumatic heart disease     CARDIOLOGIST-- DR Marcello Moores [facility]  . HTN (hypertension)     unspecified  . Hypercholesterolemia   . GERD (gastroesophageal reflux disease)   . Goiter     multinodular  . Anticoagulated on warfarin   . Pulmonary hypertension, mild   . Hyperlipidemia   . History of hyperthyroidism     2003--  PTU [place]  . Chronic atrial fibrillation     SINCE 1997  . S/P MVR (mitral valve replacement)     1997  [facility]--  SECONDARY TO SEVERE MV STENOSIS    Surgeries: Procedure(s): INTRAMEDULLARY (IM) NAIL FEMORAL OPEN REDUCTION INTERNAL FIXATION (ORIF) WRIST FRACTURE on 04/26/2014   Consultants:    Discharged Condition: Improved  Hospital Course: [name] is an 58 y.o. female who was admitted 04/26/2014 for operative treatment ofFemur fracture, right. Patient has severe unremitting pain that affects sleep, daily activities, and work/hobbies. After pre-op clearance the patient was taken to the operating room on 04/26/2014 and underwent  Procedure(s): INTRAMEDULLARY (IM) NAIL FEMORAL OPEN REDUCTION INTERNAL FIXATION (ORIF) WRIST FRACTURE.    Patient was given perioperative antibiotics: Anti-infectives    Start     Dose/Rate Route Frequency Ordered Stop   04/26/14 2200  ceFAZolin (ANCEF) IVPB 2 g/50 mL premix     2 g100 mL/hr over 30 Minutes Intravenous Every 6 hours 04/26/14 2020 04/27/14 0444   04/26/14 2020  ceFAZolin (ANCEF) 2-3 GM-% IVPB SOLR    Comments:  Veneta Penton   : cabinet override      04/26/14 2020 04/27/14 0829       Patient was given sequential compression devices, early  ambulation, and chemoprophylaxis to prevent DVT.  Patient was given blood due to symptomatic acute blood loss anemia and responded well.   Patient benefited maximally from hospital stay and there were no complications.    Recent vital signs: Patient Vitals for the past 24 hrs:  BP Temp Temp src Pulse Resp SpO2  04/30/14 0517 113/61 mmHg 98 F (36.7 C) Oral (!) 111 18 99 %  04/29/14 2148 (!) 125/58 mmHg 98.1 F (36.7 C) Oral (!) 112 17 99 %  04/29/14 1315 113/75 mmHg 97.3 F (36.3 C) Oral (!) 106 18 98 %     Recent laboratory studies:  Recent Labs  04/29/14 0404 04/30/14 0445  WBC 8.1 8.1  HGB 8.5* 8.2*  HCT 25.5* 24.8*  PLT 121* 152  INR 4.39* 2.64*     Discharge Medications:     Medication List    TAKE these medications        atorvastatin 40 MG tablet  Commonly known as:  LIPITOR  TAKE ONE TABLET BY MOUTH EVERY DAY     atorvastatin 40 MG tablet  Commonly known as:  LIPITOR  Take 1 tablet (40 mg total) by mouth every evening.     CENTRUM SILVER ADULT 50+ PO  Take 1 tablet by mouth daily.     ferrous sulfate 325 (65 FE) MG tablet  Take 1 tablet (325 mg total) by mouth 2 (two) times daily with a meal.  methocarbamol 750 MG tablet  Commonly known as:  ROBAXIN-750  Take 1 tablet (750 mg total) by mouth every 6 (six) hours as needed for muscle spasms.     metoprolol succinate 50 MG 24 hr tablet  Commonly known as:  TOPROL-XL  Take 75 mg by mouth daily. Take with or immediately following a meal.     oxyCODONE-acetaminophen 5-325 MG per tablet  Commonly known as:  PERCOCET/ROXICET  Take 1-2 tablets by mouth every 4 (four) hours as needed for moderate pain.     PRILOSEC 20 MG capsule  Generic drug:  omeprazole  Take 20 mg by mouth every morning.     warfarin 5 MG tablet  Commonly known as:  COUMADIN  Take 5 mg by mouth daily.     warfarin 5 MG tablet  Commonly known as:  COUMADIN  AS DIRECTED        Diagnostic Studies: Dg Wrist Complete  Right  04/26/2014   CLINICAL DATA:  Lump lung distal ulna. Fall, distal ulnar pain at right wrist. Initial encounter  EXAM: RIGHT WRIST - COMPLETE 3+ VIEW  COMPARISON:  None.  FINDINGS: Soft tissue swelling noted over the distal ulna. Changes within the visualized distal ulnar shaft related to old healed fracture. There appears to be evidence of old, removed hardware.  Nondisplaced fracture noted through the distal radius, best seen at the base of the radial styloid. No additional acute fracture. No subluxation or dislocation.  IMPRESSION: Nondisplaced distal radial metaphyseal fracture.  Old healed ulnar shaft fracture   Electronically Signed   By: Rolm Baptise M.D.   On: 04/26/2014 13:08   Dg Femur Right  04/26/2014   CLINICAL DATA:  Intra medullary nail of right femur. Fluoro time 2 min and 43 seconds.  EXAM: DG C-ARM 61-120 MIN; RIGHT FEMUR - 2 VIEW  COMPARISON:  04/26/2014  FINDINGS: The patient has undergone intra medullary nail screw fixation of right mid femur fracture. The femoral head is located within the acetabulum. No new fractures are identified on these fluoroscopic images.  IMPRESSION: Status post ORIF right femur.   Electronically Signed   By: Shon Hale M.D.   On: 04/26/2014 19:09   Dg Femur Right  04/26/2014   CLINICAL DATA:  Fall, midshaft right femur pain.  Initial encounter  EXAM: RIGHT FEMUR - 2 VIEW  COMPARISON:  None.  FINDINGS: There is a comminuted, displaced and angulated fractures through the right femoral shaft extending into the metaphysis. The distal fragment is displaced 1 shaft with medially relative to the proximal shaft. Mild apex anterior angulation.  IMPRESSION: Comminuted, displaced and angulated spiral fracture within the mid to distal shaft of the right femur extending into the distal metaphysis.   Electronically Signed   By: Rolm Baptise M.D.   On: 04/26/2014 13:10   Dg C-arm 1-60 Min  04/26/2014   CLINICAL DATA:  Intra medullary nail of right femur. Fluoro time 2  min and 43 seconds.  EXAM: DG C-ARM 61-120 MIN; RIGHT FEMUR - 2 VIEW  COMPARISON:  04/26/2014  FINDINGS: The patient has undergone intra medullary nail screw fixation of right mid femur fracture. The femoral head is located within the acetabulum. No new fractures are identified on these fluoroscopic images.  IMPRESSION: Status post ORIF right femur.   Electronically Signed   By: Shon Hale M.D.   On: 04/26/2014 19:09    Disposition: 01-Home or Self Care      Discharge Instructions    Call MD /  Call 911    Complete by:  As directed   If you experience chest pain or shortness of breath, CALL 911 and be transported to the hospital emergency room.  If you develope a fever above 101 F, pus (white drainage) or increased drainage or redness at the wound, or calf pain, call your surgeon's office.     Constipation Prevention    Complete by:  As directed   Drink plenty of fluids.  Prune juice may be helpful.  You may use a stool softener, such as Colace (over the counter) 100 mg twice a day.  Use MiraLax (over the counter) for constipation as needed.     Diet - low sodium heart healthy    Complete by:  As directed      Increase activity slowly as tolerated    Complete by:  As directed      Touch down weight bearing    Complete by:  As directed   Laterality:  right  Extremity:  Lower     Weight bearing as tolerated    Complete by:  As directed   Laterality:  right  Extremity:  Upper  Using platform walker           Follow-up Information    Follow up with GRAVES,JOHN L, MD. Schedule an appointment as soon as possible for a visit in 2 weeks.   Specialty:  Orthopedic Surgery   Contact information:   [place] [id number] [phone]        Signed: Rich Fuchs 04/30/2014, 9:05 AM

## 2014-04-30 NOTE — Progress Notes (Signed)
Subjective: 4 Days Post-Op Procedure(s) (LRB): INTRAMEDULLARY (IM) NAIL FEMORAL (Right) OPEN REDUCTION INTERNAL FIXATION (ORIF) WRIST FRACTURE (Right)   Patient doing much better today. She states she would like to go home.  Activity level: R LE touchdown weightbearing, R wrist WBAT Diet tolerance: ok Voiding: ok Patient reports pain as mild and moderate.   Objective: Vital signs in last 24 hours: Temp:  [97.3 F (36.3 C)-98.1 F (36.7 C)] 98 F (36.7 C) (11/08 0517) Pulse Rate:  [106-112] 111 (11/08 0517) Resp:  [17-18] 18 (11/08 0517) BP: (113-125)/(58-75) 113/61 mmHg (11/08 0517) SpO2:  [98 %-99 %] 99 % (11/08 0517)  Labs:  Recent Labs  04/28/14 0422 04/29/14 0404 04/30/14 0445  HGB 7.2* 8.5* 8.2*    Recent Labs  04/29/14 0404 04/30/14 0445  WBC 8.1 8.1  RBC 2.92* 2.84*  HCT 25.5* 24.8*  PLT 121* 152   No results for input(s): NA, K, CL, CO2, BUN, CREATININE, GLUCOSE, CALCIUM in the last 72 hours.  Recent Labs  04/29/14 0404 04/30/14 0445  INR 4.39* 2.64*    Physical Exam:  Neurologically intact ABD soft Neurovascular intact Sensation intact distally Intact pulses distally Dorsiflexion/Plantar flexion intact Incision: dressing C/D/I No cellulitis present Compartment soft  Assessment/Plan:  4 Days Post-Op Procedure(s) (LRB): INTRAMEDULLARY (IM) NAIL FEMORAL (Right) OPEN REDUCTION INTERNAL FIXATION (ORIF) WRIST FRACTURE (Right) Up with therapy Discharge home with home health Today. Continue Ice to right thigh. Continue PTTDWB on Right LE and WBAT on Right UE. Continue on home dose of coumadin as INR has returned to therapeutic range. Follow up in office 10-14 days post op.     Caffie Sotto, Larwance Sachs 04/30/2014, 8:59 AM

## 2014-04-30 NOTE — Progress Notes (Addendum)
ANTICOAGULATION CONSULT NOTE - Follow Up Consult  Pharmacy Consult:  Coumadin Indication:  Afib + history of MVR  No Known Allergies  Patient Measurements: Height: 5\' 3"  (160 cm) Weight: 176 lb (79.833 kg) IBW/kg (Calculated) : 52.4  Vital Signs: Temp: 98 F (36.7 C) (11/08 0517) Temp Source: Oral (11/08 0517) BP: 113/61 mmHg (11/08 0517) Pulse Rate: 111 (11/08 0517)  Labs:  Recent Labs  04/28/14 0422 04/29/14 0404 04/30/14 0445  HGB 7.2* 8.5* 8.2*  HCT 21.4* 25.5* 24.8*  PLT 131* 121* 152  LABPROT 60.5* 42.3* 28.4*  INR 6.96* 4.39* 2.64*    Estimated Creatinine Clearance: 74.8 mL/min (by C-G formula based on Cr of 0.76).      Assessment: 48 YOF to continue on Coumadin from PTA for history of Afib, history of mechanical AVR, and VTE prophylaxis s/p ORIF of wrist fracture.  INR trended down significantly but remains therapeutic.  Concern that INR will decrease further tomorrow.  No bleeding reported.   Goal of Therapy:  INR 3 - 3.5 per patient    Plan:  - Coumadin 7.5mg  PO today prior to discharge (RN aware and to instruct patient not to take any more Coumadin today) - Daily PT / INR    Valary Manahan D. Mina Marble, PharmD, BCPS Pager:  [phone] 04/30/2014, 10:02 AM

## 2014-05-08 ENCOUNTER — Ambulatory Visit (INDEPENDENT_AMBULATORY_CARE_PROVIDER_SITE_OTHER): Payer: BC Managed Care – PPO | Admitting: *Deleted

## 2014-05-08 DIAGNOSIS — I482 Chronic atrial fibrillation, unspecified: Secondary | ICD-10-CM

## 2014-05-08 DIAGNOSIS — I4891 Unspecified atrial fibrillation: Secondary | ICD-10-CM

## 2014-05-08 DIAGNOSIS — Z9889 Other specified postprocedural states: Secondary | ICD-10-CM

## 2014-05-08 DIAGNOSIS — Z5181 Encounter for therapeutic drug level monitoring: Secondary | ICD-10-CM

## 2014-05-08 DIAGNOSIS — I059 Rheumatic mitral valve disease, unspecified: Secondary | ICD-10-CM

## 2014-05-08 LAB — POCT INR: INR: 3.5

## 2014-05-11 ENCOUNTER — Encounter: Payer: Self-pay | Admitting: Cardiology

## 2014-05-11 ENCOUNTER — Telehealth: Payer: Self-pay | Admitting: Cardiology

## 2014-05-11 NOTE — Telephone Encounter (Signed)
Spoke with cindy, she is concerned because the patient HGB is 7.8. She was recently in the hospital and received a blood transfusion. The CBC was ordered by dr graves. Spoke with pt, she reports she feels fine. Jenny Reichmann has also sent this lab work to dr graves and we will defer treatment to him. Jenny Reichmann is aware and is going to call dr graves office.

## 2014-05-11 NOTE — Telephone Encounter (Signed)
New Msg   Cindy from [employer] [employer] is calling about patient labs and would like a call back at [phone].

## 2014-05-11 NOTE — Telephone Encounter (Signed)
Dr Stanford Breed aware of labs and agrees dr graves should handle.

## 2014-05-17 ENCOUNTER — Ambulatory Visit (INDEPENDENT_AMBULATORY_CARE_PROVIDER_SITE_OTHER): Payer: BC Managed Care – PPO | Admitting: Internal Medicine

## 2014-05-17 DIAGNOSIS — Z9889 Other specified postprocedural states: Secondary | ICD-10-CM

## 2014-05-17 DIAGNOSIS — I059 Rheumatic mitral valve disease, unspecified: Secondary | ICD-10-CM

## 2014-05-17 DIAGNOSIS — Z5181 Encounter for therapeutic drug level monitoring: Secondary | ICD-10-CM

## 2014-05-17 DIAGNOSIS — I482 Chronic atrial fibrillation, unspecified: Secondary | ICD-10-CM

## 2014-05-17 LAB — POCT INR: INR: 2.8

## 2014-05-22 ENCOUNTER — Telehealth: Payer: Self-pay | Admitting: Cardiology

## 2014-05-22 MED ORDER — WARFARIN SODIUM 5 MG PO TABS: 5.0000 mg | ORAL_TABLET | ORAL | Status: DC

## 2014-05-22 NOTE — Telephone Encounter (Signed)
New message      Refill warfarin at [facility] [address] in [place]

## 2014-05-24 ENCOUNTER — Ambulatory Visit (INDEPENDENT_AMBULATORY_CARE_PROVIDER_SITE_OTHER): Payer: BC Managed Care – PPO | Admitting: Interventional Cardiology

## 2014-05-24 DIAGNOSIS — Z9889 Other specified postprocedural states: Secondary | ICD-10-CM

## 2014-05-24 DIAGNOSIS — I482 Chronic atrial fibrillation, unspecified: Secondary | ICD-10-CM

## 2014-05-24 DIAGNOSIS — I059 Rheumatic mitral valve disease, unspecified: Secondary | ICD-10-CM

## 2014-05-24 DIAGNOSIS — Z5181 Encounter for therapeutic drug level monitoring: Secondary | ICD-10-CM

## 2014-05-24 LAB — POCT INR: INR: 3

## 2014-05-31 NOTE — Progress Notes (Signed)
HPI: FU atrial fibrillation and previous mitral valve replacement for rheumatic heart disease. Patient had mitral valve replacement in 1997. Cardiac catheterization in 2008 showed normal coronary arteries. ABIs with Doppler in July of 2014 normal. Last echocardiogram December 2014 showed normal LV function, trace aortic insufficiency, normally functioning prosthetic mitral valve, severe left atrial enlargement. Holter monitor January 2015 showed heart rate was elevated and beta blocker was increased. Since she was last seen, she denies dyspnea, chest pain or syncope.  Current Outpatient Prescriptions  Medication Sig Dispense Refill  . atorvastatin (LIPITOR) 40 MG tablet TAKE ONE TABLET BY MOUTH EVERY DAY 90 tablet 1  . atorvastatin (LIPITOR) 40 MG tablet Take 1 tablet (40 mg total) by mouth every evening. 90 tablet 0  . ferrous sulfate 325 (65 FE) MG tablet Take 1 tablet (325 mg total) by mouth 2 (two) times daily with a meal. 40 tablet 0  . methocarbamol (ROBAXIN-750) 750 MG tablet Take 1 tablet (750 mg total) by mouth every 6 (six) hours as needed for muscle spasms. 50 tablet 0  . metoprolol succinate (TOPROL-XL) 50 MG 24 hr tablet Take 75 mg by mouth daily. Take with or immediately following a meal.    . Multiple Vitamins-Minerals (CENTRUM SILVER ADULT 50+ PO) Take 1 tablet by mouth daily.    Marland Kitchen omeprazole (PRILOSEC) 20 MG capsule Take 20 mg by mouth every morning.     Marland Kitchen oxyCODONE-acetaminophen (PERCOCET/ROXICET) 5-325 MG per tablet Take 1-2 tablets by mouth every 4 (four) hours as needed for moderate pain. 50 tablet 0  . warfarin (COUMADIN) 5 MG tablet Take 1 tablet (5 mg total) by mouth as directed. 30 tablet 3   No current facility-administered medications for this visit.     Past Medical History  Diagnosis Date  . Rheumatic heart disease     CARDIOLOGIST-- DR Marcello Moores [facility]  . HTN (hypertension)     unspecified  . Hypercholesterolemia   . GERD (gastroesophageal reflux disease)     . Goiter     multinodular  . Anticoagulated on warfarin   . Pulmonary hypertension, mild   . Hyperlipidemia   . History of hyperthyroidism     2003--  PTU [place]  . Chronic atrial fibrillation     SINCE 1997  . S/P MVR (mitral valve replacement)     1997  [facility]--  SECONDARY TO SEVERE MV STENOSIS    Past Surgical History  Procedure Laterality Date  . Wrist arthroscopy Right 12-10-2007    debridement and ulnar shortening osteotomy w/ plate  . Carpal tunnel release Right 01-20-2007  . Ercp  08/15/2011    Procedure: ENDOSCOPIC RETROGRADE CHOLANGIOPANCREATOGRAPHY (ERCP);  Surgeon: Jeryl Columbia, MD;  Location: [facility];  Service: Endoscopy;  Laterality: N/A;  . Orif ankle fracture Right 11/05/2012    Procedure: OPEN REDUCTION INTERNAL FIXATION (ORIF) ANKLE FRACTURE only operating on right;  Surgeon: Alta Corning, MD;  Location: [facility];  Service: Orthopedics;  Laterality: Right;  . Anterior cervical decomp/discectomy fusion  12-21-2001    C5 --  C6  . Laparoscopic cholecystectomy  04-08-2002  . Cardiac catheterization  12-28-2006  DR [facility]    NORMAL CORONARY ARTERIES  . Mitral valve replacement  1997    [facility]  . Orif right ulnar fx w/ iliac crest bone graft  10-06-2008  . Ercp      MULTIPLE--  INCLUDING STENTS, SPHINTEROTOMY'S  STONE REMOVAL   . Transthoracic echocardiogram  06-29-2009  dr wall  normal lvsf/ ef 55-60%/  normal bileaflet mechanical mvr/ moderated dilated la/ moderate tr  . I&d extremity Right 02/07/2013    Procedure: IRRIGATION AND DEBRIDEMENT OF RIGHT LOWER LEG WITH PLACEMENT OF ACELL AND WOULND VAC;  Surgeon: Theodoro Kos, DO;  Location: [facility];  Service: Plastics;  Laterality: Right;  . Femur im nail Right 04/26/2014    Procedure: INTRAMEDULLARY (IM) NAIL FEMORAL;  Surgeon: Alta Corning, MD;  Location: [facility];  Service: Orthopedics;  Laterality: Right;  . Orif wrist fracture Right 04/26/2014    Procedure: OPEN REDUCTION INTERNAL FIXATION  (ORIF) WRIST FRACTURE;  Surgeon: Alta Corning, MD;  Location: [facility];  Service: Orthopedics;  Laterality: Right;    History   Social History  . Marital Status: Divorced    Spouse Name: N/A    Number of Children: N/A  . Years of Education: N/A   Occupational History  . Control and instrumentation engineer    Social History Main Topics  . Smoking status: Never Smoker   . Smokeless tobacco: Not on file  . Alcohol Use: No  . Drug Use: No  . Sexual Activity: Not on file   Other Topics Concern  . Not on file   Social History Narrative    ROS: no fevers or chills, productive cough, hemoptysis, dysphasia, odynophagia, melena, hematochezia, dysuria, hematuria, rash, seizure activity, orthopnea, PND, pedal edema, claudication. Remaining systems are negative.  Physical Exam: Well-developed well-nourished in no acute distress.  Skin is warm and dry.  HEENT is normal.  Neck is supple.  Chest is clear to auscultation with normal expansion.  Cardiovascular exam is irregular, crisp mechanical valve sound Abdominal exam nontender or distended. No masses palpated. Extremities show no edema. Status post repair of upper and lower limb fracture neuro grossly intact  ECG 04/26/2014-atrial fibrillation.

## 2014-06-05 ENCOUNTER — Ambulatory Visit (INDEPENDENT_AMBULATORY_CARE_PROVIDER_SITE_OTHER): Payer: BC Managed Care – PPO | Admitting: Cardiology

## 2014-06-05 ENCOUNTER — Encounter: Payer: Self-pay | Admitting: Cardiology

## 2014-06-05 VITALS — BP 152/70 | HR 94 | Ht 64.0 in | Wt 182.7 lb

## 2014-06-05 DIAGNOSIS — I482 Chronic atrial fibrillation, unspecified: Secondary | ICD-10-CM

## 2014-06-05 DIAGNOSIS — E7800 Pure hypercholesterolemia, unspecified: Secondary | ICD-10-CM

## 2014-06-05 DIAGNOSIS — I059 Rheumatic mitral valve disease, unspecified: Secondary | ICD-10-CM

## 2014-06-05 DIAGNOSIS — I10 Essential (primary) hypertension: Secondary | ICD-10-CM

## 2014-06-05 MED ORDER — METOPROLOL SUCCINATE ER 100 MG PO TB24: 100.0000 mg | ORAL_TABLET | Freq: Every day | ORAL | Status: DC

## 2014-06-05 NOTE — Assessment & Plan Note (Signed)
Continue Toprol for rate control but increase dose to 100 mg daily. Continue Coumadin.

## 2014-06-05 NOTE — Assessment & Plan Note (Signed)
Continue statin. 

## 2014-06-05 NOTE — Assessment & Plan Note (Signed)
Chrisp mechanical valve sound on examination. Continue SBE prophylaxis.

## 2014-06-05 NOTE — Assessment & Plan Note (Signed)
Blood pressure elevated. Increase Toprol to 100 mg daily.

## 2014-06-05 NOTE — Patient Instructions (Signed)
Your physician wants you to follow-up in: [place] will receive a reminder letter in the mail two months in advance. If you don't receive a letter, please call our office to schedule the follow-up appointment.   INCREASE METOPROLOL TO 100 MG ONCE DAILY= 2 OF THE 50 MG TABLETS ONCE DAILY

## 2014-06-07 ENCOUNTER — Ambulatory Visit (INDEPENDENT_AMBULATORY_CARE_PROVIDER_SITE_OTHER): Payer: BC Managed Care – PPO | Admitting: Cardiology

## 2014-06-07 DIAGNOSIS — Z9889 Other specified postprocedural states: Secondary | ICD-10-CM

## 2014-06-07 DIAGNOSIS — I059 Rheumatic mitral valve disease, unspecified: Secondary | ICD-10-CM

## 2014-06-07 DIAGNOSIS — I482 Chronic atrial fibrillation, unspecified: Secondary | ICD-10-CM

## 2014-06-07 DIAGNOSIS — Z5181 Encounter for therapeutic drug level monitoring: Secondary | ICD-10-CM

## 2014-06-07 LAB — POCT INR: INR: 3.6

## 2014-06-22 ENCOUNTER — Ambulatory Visit (INDEPENDENT_AMBULATORY_CARE_PROVIDER_SITE_OTHER): Payer: BC Managed Care – PPO | Admitting: Pharmacist

## 2014-06-22 DIAGNOSIS — Z9889 Other specified postprocedural states: Secondary | ICD-10-CM

## 2014-06-22 DIAGNOSIS — I059 Rheumatic mitral valve disease, unspecified: Secondary | ICD-10-CM

## 2014-06-22 DIAGNOSIS — Z5181 Encounter for therapeutic drug level monitoring: Secondary | ICD-10-CM

## 2014-06-22 DIAGNOSIS — I482 Chronic atrial fibrillation, unspecified: Secondary | ICD-10-CM

## 2014-06-22 LAB — POCT INR: INR: 2.8

## 2014-07-06 ENCOUNTER — Ambulatory Visit (INDEPENDENT_AMBULATORY_CARE_PROVIDER_SITE_OTHER): Payer: BC Managed Care – PPO | Admitting: Pharmacist

## 2014-07-06 DIAGNOSIS — I482 Chronic atrial fibrillation, unspecified: Secondary | ICD-10-CM

## 2014-07-06 DIAGNOSIS — Z9889 Other specified postprocedural states: Secondary | ICD-10-CM

## 2014-07-06 DIAGNOSIS — I4891 Unspecified atrial fibrillation: Secondary | ICD-10-CM

## 2014-07-06 DIAGNOSIS — I059 Rheumatic mitral valve disease, unspecified: Secondary | ICD-10-CM

## 2014-07-06 DIAGNOSIS — Z5181 Encounter for therapeutic drug level monitoring: Secondary | ICD-10-CM

## 2014-07-06 LAB — POCT INR: INR: 2.7

## 2014-07-10 ENCOUNTER — Other Ambulatory Visit: Payer: Self-pay | Admitting: *Deleted

## 2014-07-10 ENCOUNTER — Telehealth: Payer: Self-pay | Admitting: Cardiology

## 2014-07-10 MED ORDER — ATORVASTATIN CALCIUM 40 MG PO TABS: 40.0000 mg | ORAL_TABLET | Freq: Every evening | ORAL | Status: DC

## 2014-07-10 NOTE — Telephone Encounter (Signed)
Pt still have not received her Atorvastatin, Please call this today to [phone].

## 2014-07-20 ENCOUNTER — Ambulatory Visit (INDEPENDENT_AMBULATORY_CARE_PROVIDER_SITE_OTHER): Payer: BC Managed Care – PPO | Admitting: Pharmacist Clinician (PhC)/ Clinical Pharmacy Specialist

## 2014-07-20 DIAGNOSIS — I482 Chronic atrial fibrillation, unspecified: Secondary | ICD-10-CM

## 2014-07-20 DIAGNOSIS — I059 Rheumatic mitral valve disease, unspecified: Secondary | ICD-10-CM

## 2014-07-20 DIAGNOSIS — I4891 Unspecified atrial fibrillation: Secondary | ICD-10-CM

## 2014-07-20 DIAGNOSIS — Z9889 Other specified postprocedural states: Secondary | ICD-10-CM

## 2014-07-20 DIAGNOSIS — Z5181 Encounter for therapeutic drug level monitoring: Secondary | ICD-10-CM

## 2014-07-20 LAB — POCT INR: INR: 3.3

## 2014-08-16 ENCOUNTER — Ambulatory Visit (INDEPENDENT_AMBULATORY_CARE_PROVIDER_SITE_OTHER): Payer: BC Managed Care – PPO

## 2014-08-16 DIAGNOSIS — Z5181 Encounter for therapeutic drug level monitoring: Secondary | ICD-10-CM

## 2014-08-16 DIAGNOSIS — Z9889 Other specified postprocedural states: Secondary | ICD-10-CM

## 2014-08-16 DIAGNOSIS — I4891 Unspecified atrial fibrillation: Secondary | ICD-10-CM

## 2014-08-16 DIAGNOSIS — I059 Rheumatic mitral valve disease, unspecified: Secondary | ICD-10-CM

## 2014-08-16 DIAGNOSIS — I482 Chronic atrial fibrillation, unspecified: Secondary | ICD-10-CM

## 2014-08-16 LAB — POCT INR: INR: 2.8

## 2014-09-13 ENCOUNTER — Ambulatory Visit (INDEPENDENT_AMBULATORY_CARE_PROVIDER_SITE_OTHER): Payer: BC Managed Care – PPO

## 2014-09-13 DIAGNOSIS — I482 Chronic atrial fibrillation, unspecified: Secondary | ICD-10-CM

## 2014-09-13 DIAGNOSIS — Z9889 Other specified postprocedural states: Secondary | ICD-10-CM

## 2014-09-13 DIAGNOSIS — I4891 Unspecified atrial fibrillation: Secondary | ICD-10-CM | POA: Diagnosis not present

## 2014-09-13 DIAGNOSIS — I059 Rheumatic mitral valve disease, unspecified: Secondary | ICD-10-CM | POA: Diagnosis not present

## 2014-09-13 DIAGNOSIS — Z5181 Encounter for therapeutic drug level monitoring: Secondary | ICD-10-CM | POA: Diagnosis not present

## 2014-09-13 LAB — POCT INR: INR: 3

## 2014-09-25 ENCOUNTER — Other Ambulatory Visit: Payer: Self-pay

## 2014-09-25 MED ORDER — WARFARIN SODIUM 5 MG PO TABS: 5.0000 mg | ORAL_TABLET | ORAL | Status: DC

## 2014-10-11 ENCOUNTER — Ambulatory Visit (INDEPENDENT_AMBULATORY_CARE_PROVIDER_SITE_OTHER): Payer: BC Managed Care – PPO

## 2014-10-11 DIAGNOSIS — I482 Chronic atrial fibrillation, unspecified: Secondary | ICD-10-CM

## 2014-10-11 DIAGNOSIS — Z9889 Other specified postprocedural states: Secondary | ICD-10-CM | POA: Diagnosis not present

## 2014-10-11 DIAGNOSIS — I059 Rheumatic mitral valve disease, unspecified: Secondary | ICD-10-CM

## 2014-10-11 DIAGNOSIS — I4891 Unspecified atrial fibrillation: Secondary | ICD-10-CM

## 2014-10-11 DIAGNOSIS — Z5181 Encounter for therapeutic drug level monitoring: Secondary | ICD-10-CM | POA: Diagnosis not present

## 2014-10-11 LAB — POCT INR: INR: 3.6

## 2014-11-08 ENCOUNTER — Ambulatory Visit (INDEPENDENT_AMBULATORY_CARE_PROVIDER_SITE_OTHER): Payer: BC Managed Care – PPO

## 2014-11-08 DIAGNOSIS — Z9889 Other specified postprocedural states: Secondary | ICD-10-CM | POA: Diagnosis not present

## 2014-11-08 DIAGNOSIS — I482 Chronic atrial fibrillation, unspecified: Secondary | ICD-10-CM

## 2014-11-08 DIAGNOSIS — Z5181 Encounter for therapeutic drug level monitoring: Secondary | ICD-10-CM | POA: Diagnosis not present

## 2014-11-08 DIAGNOSIS — I059 Rheumatic mitral valve disease, unspecified: Secondary | ICD-10-CM | POA: Diagnosis not present

## 2014-11-08 DIAGNOSIS — I4891 Unspecified atrial fibrillation: Secondary | ICD-10-CM | POA: Diagnosis not present

## 2014-11-08 LAB — POCT INR: INR: 3.7

## 2014-12-06 ENCOUNTER — Ambulatory Visit (INDEPENDENT_AMBULATORY_CARE_PROVIDER_SITE_OTHER): Payer: BC Managed Care – PPO

## 2014-12-06 DIAGNOSIS — I482 Chronic atrial fibrillation, unspecified: Secondary | ICD-10-CM

## 2014-12-06 DIAGNOSIS — Z5181 Encounter for therapeutic drug level monitoring: Secondary | ICD-10-CM

## 2014-12-06 DIAGNOSIS — Z9889 Other specified postprocedural states: Secondary | ICD-10-CM | POA: Diagnosis not present

## 2014-12-06 DIAGNOSIS — I059 Rheumatic mitral valve disease, unspecified: Secondary | ICD-10-CM

## 2014-12-06 DIAGNOSIS — I4891 Unspecified atrial fibrillation: Secondary | ICD-10-CM | POA: Diagnosis not present

## 2014-12-06 LAB — POCT INR: INR: 4.2

## 2014-12-13 ENCOUNTER — Telehealth: Payer: Self-pay | Admitting: *Deleted

## 2014-12-13 NOTE — Telephone Encounter (Signed)
Pt called stating was diagnosed with Shingles and had started taking Valacyclovir  1000 mg tid for 1 week and began taking this yesterday . Pt informed that this medication and coumadin did not have interaction but to be sure and keep her scheduled appt to have INR checked June 29th and she states understanding

## 2014-12-20 ENCOUNTER — Ambulatory Visit (INDEPENDENT_AMBULATORY_CARE_PROVIDER_SITE_OTHER): Payer: BC Managed Care – PPO

## 2014-12-20 DIAGNOSIS — Z9889 Other specified postprocedural states: Secondary | ICD-10-CM | POA: Diagnosis not present

## 2014-12-20 DIAGNOSIS — I4891 Unspecified atrial fibrillation: Secondary | ICD-10-CM

## 2014-12-20 DIAGNOSIS — I059 Rheumatic mitral valve disease, unspecified: Secondary | ICD-10-CM | POA: Diagnosis not present

## 2014-12-20 DIAGNOSIS — Z5181 Encounter for therapeutic drug level monitoring: Secondary | ICD-10-CM

## 2014-12-20 DIAGNOSIS — I482 Chronic atrial fibrillation, unspecified: Secondary | ICD-10-CM

## 2014-12-20 LAB — POCT INR: INR: 1.9

## 2015-01-03 ENCOUNTER — Ambulatory Visit (INDEPENDENT_AMBULATORY_CARE_PROVIDER_SITE_OTHER): Payer: BC Managed Care – PPO

## 2015-01-03 DIAGNOSIS — Z5181 Encounter for therapeutic drug level monitoring: Secondary | ICD-10-CM

## 2015-01-03 DIAGNOSIS — I482 Chronic atrial fibrillation, unspecified: Secondary | ICD-10-CM

## 2015-01-03 DIAGNOSIS — Z9889 Other specified postprocedural states: Secondary | ICD-10-CM

## 2015-01-03 DIAGNOSIS — I059 Rheumatic mitral valve disease, unspecified: Secondary | ICD-10-CM

## 2015-01-03 DIAGNOSIS — I4891 Unspecified atrial fibrillation: Secondary | ICD-10-CM | POA: Diagnosis not present

## 2015-01-03 LAB — POCT INR: INR: 3.7

## 2015-01-24 ENCOUNTER — Ambulatory Visit (INDEPENDENT_AMBULATORY_CARE_PROVIDER_SITE_OTHER): Payer: BC Managed Care – PPO | Admitting: *Deleted

## 2015-01-24 DIAGNOSIS — Z9889 Other specified postprocedural states: Secondary | ICD-10-CM

## 2015-01-24 DIAGNOSIS — Z5181 Encounter for therapeutic drug level monitoring: Secondary | ICD-10-CM

## 2015-01-24 DIAGNOSIS — I4891 Unspecified atrial fibrillation: Secondary | ICD-10-CM

## 2015-01-24 DIAGNOSIS — I059 Rheumatic mitral valve disease, unspecified: Secondary | ICD-10-CM

## 2015-01-24 DIAGNOSIS — I482 Chronic atrial fibrillation, unspecified: Secondary | ICD-10-CM

## 2015-01-24 LAB — POCT INR: INR: 4.4

## 2015-02-07 ENCOUNTER — Telehealth: Payer: Self-pay

## 2015-02-07 ENCOUNTER — Other Ambulatory Visit (INDEPENDENT_AMBULATORY_CARE_PROVIDER_SITE_OTHER): Payer: BC Managed Care – PPO

## 2015-02-07 ENCOUNTER — Ambulatory Visit (INDEPENDENT_AMBULATORY_CARE_PROVIDER_SITE_OTHER): Payer: BC Managed Care – PPO

## 2015-02-07 DIAGNOSIS — I059 Rheumatic mitral valve disease, unspecified: Secondary | ICD-10-CM

## 2015-02-07 DIAGNOSIS — Z9889 Other specified postprocedural states: Secondary | ICD-10-CM

## 2015-02-07 DIAGNOSIS — Z5181 Encounter for therapeutic drug level monitoring: Secondary | ICD-10-CM | POA: Diagnosis not present

## 2015-02-07 DIAGNOSIS — I482 Chronic atrial fibrillation, unspecified: Secondary | ICD-10-CM

## 2015-02-07 DIAGNOSIS — I4891 Unspecified atrial fibrillation: Secondary | ICD-10-CM

## 2015-02-07 LAB — POCT INR
INR: 6.9
INR: 5.3

## 2015-02-07 NOTE — Telephone Encounter (Signed)
[facility] reported the STAT INR of 5.3 and a PT of 60.5, the result will be faxed as well.

## 2015-02-07 NOTE — Telephone Encounter (Signed)
Result noted see anticoagulation note in Epic.  

## 2015-02-14 ENCOUNTER — Ambulatory Visit (INDEPENDENT_AMBULATORY_CARE_PROVIDER_SITE_OTHER): Payer: BC Managed Care – PPO | Admitting: Pharmacist

## 2015-02-14 DIAGNOSIS — I482 Chronic atrial fibrillation, unspecified: Secondary | ICD-10-CM

## 2015-02-14 DIAGNOSIS — Z9889 Other specified postprocedural states: Secondary | ICD-10-CM

## 2015-02-14 DIAGNOSIS — Z5181 Encounter for therapeutic drug level monitoring: Secondary | ICD-10-CM

## 2015-02-14 DIAGNOSIS — I059 Rheumatic mitral valve disease, unspecified: Secondary | ICD-10-CM

## 2015-02-14 LAB — POCT INR: INR: 2.2

## 2015-02-21 ENCOUNTER — Ambulatory Visit (INDEPENDENT_AMBULATORY_CARE_PROVIDER_SITE_OTHER): Payer: BC Managed Care – PPO

## 2015-02-21 DIAGNOSIS — I482 Chronic atrial fibrillation, unspecified: Secondary | ICD-10-CM

## 2015-02-21 DIAGNOSIS — I059 Rheumatic mitral valve disease, unspecified: Secondary | ICD-10-CM

## 2015-02-21 DIAGNOSIS — Z9889 Other specified postprocedural states: Secondary | ICD-10-CM

## 2015-02-21 DIAGNOSIS — Z5181 Encounter for therapeutic drug level monitoring: Secondary | ICD-10-CM

## 2015-02-21 LAB — POCT INR: INR: 3.6

## 2015-03-07 ENCOUNTER — Ambulatory Visit (INDEPENDENT_AMBULATORY_CARE_PROVIDER_SITE_OTHER): Payer: BC Managed Care – PPO

## 2015-03-07 DIAGNOSIS — I482 Chronic atrial fibrillation, unspecified: Secondary | ICD-10-CM

## 2015-03-07 DIAGNOSIS — I059 Rheumatic mitral valve disease, unspecified: Secondary | ICD-10-CM

## 2015-03-07 DIAGNOSIS — Z9889 Other specified postprocedural states: Secondary | ICD-10-CM

## 2015-03-07 DIAGNOSIS — I4891 Unspecified atrial fibrillation: Secondary | ICD-10-CM | POA: Diagnosis not present

## 2015-03-07 DIAGNOSIS — Z5181 Encounter for therapeutic drug level monitoring: Secondary | ICD-10-CM | POA: Diagnosis not present

## 2015-03-07 LAB — POCT INR: INR: 3.9

## 2015-03-11 ENCOUNTER — Other Ambulatory Visit: Payer: Self-pay | Admitting: Cardiology

## 2015-03-21 ENCOUNTER — Ambulatory Visit (INDEPENDENT_AMBULATORY_CARE_PROVIDER_SITE_OTHER): Payer: BC Managed Care – PPO

## 2015-03-21 DIAGNOSIS — I4891 Unspecified atrial fibrillation: Secondary | ICD-10-CM | POA: Diagnosis not present

## 2015-03-21 DIAGNOSIS — I482 Chronic atrial fibrillation, unspecified: Secondary | ICD-10-CM

## 2015-03-21 DIAGNOSIS — Z5181 Encounter for therapeutic drug level monitoring: Secondary | ICD-10-CM | POA: Diagnosis not present

## 2015-03-21 DIAGNOSIS — Z9889 Other specified postprocedural states: Secondary | ICD-10-CM

## 2015-03-21 DIAGNOSIS — I059 Rheumatic mitral valve disease, unspecified: Secondary | ICD-10-CM | POA: Diagnosis not present

## 2015-03-21 LAB — POCT INR: INR: 3

## 2015-04-11 ENCOUNTER — Ambulatory Visit (INDEPENDENT_AMBULATORY_CARE_PROVIDER_SITE_OTHER): Payer: BC Managed Care – PPO

## 2015-04-11 DIAGNOSIS — I059 Rheumatic mitral valve disease, unspecified: Secondary | ICD-10-CM

## 2015-04-11 DIAGNOSIS — Z9889 Other specified postprocedural states: Secondary | ICD-10-CM

## 2015-04-11 DIAGNOSIS — I4891 Unspecified atrial fibrillation: Secondary | ICD-10-CM

## 2015-04-11 DIAGNOSIS — I482 Chronic atrial fibrillation, unspecified: Secondary | ICD-10-CM

## 2015-04-11 DIAGNOSIS — Z5181 Encounter for therapeutic drug level monitoring: Secondary | ICD-10-CM

## 2015-04-11 LAB — POCT INR: INR: 4.4

## 2015-04-25 ENCOUNTER — Ambulatory Visit (INDEPENDENT_AMBULATORY_CARE_PROVIDER_SITE_OTHER): Payer: BC Managed Care – PPO

## 2015-04-25 DIAGNOSIS — Z9889 Other specified postprocedural states: Secondary | ICD-10-CM | POA: Diagnosis not present

## 2015-04-25 DIAGNOSIS — Z5181 Encounter for therapeutic drug level monitoring: Secondary | ICD-10-CM

## 2015-04-25 DIAGNOSIS — I059 Rheumatic mitral valve disease, unspecified: Secondary | ICD-10-CM | POA: Diagnosis not present

## 2015-04-25 DIAGNOSIS — I4891 Unspecified atrial fibrillation: Secondary | ICD-10-CM | POA: Diagnosis not present

## 2015-04-25 DIAGNOSIS — I482 Chronic atrial fibrillation, unspecified: Secondary | ICD-10-CM

## 2015-04-25 LAB — POCT INR: INR: 1.7

## 2015-04-27 ENCOUNTER — Other Ambulatory Visit: Payer: Self-pay | Admitting: Orthopedic Surgery

## 2015-04-27 DIAGNOSIS — M25551 Pain in right hip: Secondary | ICD-10-CM

## 2015-05-02 ENCOUNTER — Ambulatory Visit
Admission: RE | Admit: 2015-05-02 | Discharge: 2015-05-02 | Disposition: A | Discharge status: Home / Self Care | Payer: BC Managed Care – PPO | Source: Ambulatory Visit | Attending: Orthopedic Surgery | Admitting: Orthopedic Surgery

## 2015-05-02 DIAGNOSIS — M25551 Pain in right hip: Secondary | ICD-10-CM

## 2015-05-09 ENCOUNTER — Ambulatory Visit (INDEPENDENT_AMBULATORY_CARE_PROVIDER_SITE_OTHER): Payer: BC Managed Care – PPO

## 2015-05-09 DIAGNOSIS — I4891 Unspecified atrial fibrillation: Secondary | ICD-10-CM | POA: Diagnosis not present

## 2015-05-09 DIAGNOSIS — Z9889 Other specified postprocedural states: Secondary | ICD-10-CM | POA: Diagnosis not present

## 2015-05-09 DIAGNOSIS — Z5181 Encounter for therapeutic drug level monitoring: Secondary | ICD-10-CM

## 2015-05-09 DIAGNOSIS — I059 Rheumatic mitral valve disease, unspecified: Secondary | ICD-10-CM

## 2015-05-09 DIAGNOSIS — I482 Chronic atrial fibrillation, unspecified: Secondary | ICD-10-CM

## 2015-05-09 LAB — POCT INR: INR: 6.2

## 2015-05-16 ENCOUNTER — Ambulatory Visit (INDEPENDENT_AMBULATORY_CARE_PROVIDER_SITE_OTHER): Payer: BC Managed Care – PPO

## 2015-05-16 DIAGNOSIS — I059 Rheumatic mitral valve disease, unspecified: Secondary | ICD-10-CM | POA: Diagnosis not present

## 2015-05-16 DIAGNOSIS — Z9889 Other specified postprocedural states: Secondary | ICD-10-CM

## 2015-05-16 DIAGNOSIS — Z5181 Encounter for therapeutic drug level monitoring: Secondary | ICD-10-CM | POA: Diagnosis not present

## 2015-05-16 DIAGNOSIS — I4891 Unspecified atrial fibrillation: Secondary | ICD-10-CM | POA: Diagnosis not present

## 2015-05-16 DIAGNOSIS — I482 Chronic atrial fibrillation, unspecified: Secondary | ICD-10-CM

## 2015-05-16 LAB — POCT INR: INR: 3

## 2015-05-30 ENCOUNTER — Ambulatory Visit (INDEPENDENT_AMBULATORY_CARE_PROVIDER_SITE_OTHER): Payer: BC Managed Care – PPO

## 2015-05-30 DIAGNOSIS — I4891 Unspecified atrial fibrillation: Secondary | ICD-10-CM | POA: Diagnosis not present

## 2015-05-30 DIAGNOSIS — I059 Rheumatic mitral valve disease, unspecified: Secondary | ICD-10-CM

## 2015-05-30 DIAGNOSIS — I482 Chronic atrial fibrillation, unspecified: Secondary | ICD-10-CM

## 2015-05-30 DIAGNOSIS — Z9889 Other specified postprocedural states: Secondary | ICD-10-CM

## 2015-05-30 DIAGNOSIS — Z5181 Encounter for therapeutic drug level monitoring: Secondary | ICD-10-CM

## 2015-05-30 LAB — POCT INR: INR: 4.6

## 2015-06-04 NOTE — Progress Notes (Signed)
HPI: FU atrial fibrillation and previous mitral valve replacement for rheumatic heart disease. Patient had mitral valve replacement in 1997. Cardiac catheterization in 2008 showed normal coronary arteries. ABIs with Doppler in July of 2014 normal. Last echocardiogram December 2014 showed normal LV function, trace aortic insufficiency, normally functioning prosthetic mitral valve, severe left atrial enlargement. Holter monitor January 2015 showed heart rate was elevated and beta blocker was increased. Since last seen, There is no dyspnea, chest pain, palpitations or syncope.  Current Outpatient Prescriptions  Medication Sig Dispense Refill  . atorvastatin (LIPITOR) 40 MG tablet Take 1 tablet (40 mg total) by mouth every evening. 90 tablet 4  . ferrous sulfate 325 (65 FE) MG tablet Take 1 tablet (325 mg total) by mouth 2 (two) times daily with a meal. 40 tablet 0  . gabapentin (NEURONTIN) 300 MG capsule Take 1 capsule by mouth. Use as directed    . methocarbamol (ROBAXIN-750) 750 MG tablet Take 1 tablet (750 mg total) by mouth every 6 (six) hours as needed for muscle spasms. 50 tablet 0  . metoprolol succinate (TOPROL-XL) 100 MG 24 hr tablet Take 1 tablet (100 mg total) by mouth daily. Take with or immediately following a meal. 90 tablet 3  . Multiple Vitamins-Minerals (CENTRUM SILVER ADULT 50+ PO) Take 1 tablet by mouth daily.    Marland Kitchen omeprazole (PRILOSEC) 20 MG capsule Take 20 mg by mouth every morning.     Marland Kitchen oxyCODONE-acetaminophen (PERCOCET/ROXICET) 5-325 MG per tablet Take 1-2 tablets by mouth every 4 (four) hours as needed for moderate pain. 50 tablet 0  . warfarin (COUMADIN) 5 MG tablet TAKE ONE TABLET BY MOUTH AS DIRECTED 40 tablet 3   No current facility-administered medications for this visit.     Past Medical History  Diagnosis Date  . Rheumatic heart disease     CARDIOLOGIST-- DR Marcello Moores [facility]  . HTN (hypertension)     unspecified  . Hypercholesterolemia   . GERD  (gastroesophageal reflux disease)   . Goiter     multinodular  . Anticoagulated on warfarin   . Pulmonary hypertension, mild ([place])   . Hyperlipidemia   . History of hyperthyroidism     2003--  PTU [place]  . Chronic atrial fibrillation ([place])     SINCE 1997  . S/P MVR (mitral valve replacement)     1997  [facility]--  SECONDARY TO SEVERE MV STENOSIS    Past Surgical History  Procedure Laterality Date  . Wrist arthroscopy Right 12-10-2007    debridement and ulnar shortening osteotomy w/ plate  . Carpal tunnel release Right 01-20-2007  . Ercp  08/15/2011    Procedure: ENDOSCOPIC RETROGRADE CHOLANGIOPANCREATOGRAPHY (ERCP);  Surgeon: Jeryl Columbia, MD;  [facility]COPY;  Service: Endoscopy;  Laterality: N/A;  . Orif ankle fracture Right 11/05/2012    Procedure: OPEN REDUCTION INTERNAL FIXATION (ORIF) ANKLE FRACTURE only operating on right;  Surgeon: Alta Corning, MD;  Location: [place];  Service: Orthopedics;  Laterality: Right;  . Anterior cervical decomp/discectomy fusion  12-21-2001    C5 --  C6  . Laparoscopic cholecystectomy  04-08-2002  . Cardiac catheterization  12-28-2006  DR [facility]    NORMAL CORONARY ARTERIES  . Mitral valve replacement  1997    [facility]  . Orif right ulnar fx w/ iliac crest bone graft  10-06-2008  . Ercp      MULTIPLE--  INCLUDING STENTS, SPHINTEROTOMY'S  STONE REMOVAL   . Transthoracic echocardiogram  06-29-2009  dr wall    normal lvsf/ ef 55-60%/  normal bileaflet mechanical mvr/ moderated dilated la/ moderate tr  . I&d extremity Right 02/07/2013    Procedure: IRRIGATION AND DEBRIDEMENT OF RIGHT LOWER LEG WITH PLACEMENT OF ACELL AND WOULND VAC;  Surgeon: Theodoro Kos, DO;  Location: [place];  Service: Plastics;  Laterality: Right;  . Femur im nail Right 04/26/2014    Procedure: INTRAMEDULLARY (IM) NAIL FEMORAL;  Surgeon: Alta Corning, MD;  Location: [place];  Service: Orthopedics;  Laterality: Right;  . Orif wrist fracture Right 04/26/2014     Procedure: OPEN REDUCTION INTERNAL FIXATION (ORIF) WRIST FRACTURE;  Surgeon: Alta Corning, MD;  Location: [place];  Service: Orthopedics;  Laterality: Right;    Social History   Social History  . Marital Status: Divorced    Spouse Name: N/A  . Number of Children: N/A  . Years of Education: N/A   Occupational History  . Control and instrumentation engineer    Social History Main Topics  . Smoking status: Never Smoker   . Smokeless tobacco: Not on file  . Alcohol Use: No  . Drug Use: No  . Sexual Activity: Not on file   Other Topics Concern  . Not on file   Social History Narrative    ROS: no fevers or chills, productive cough, hemoptysis, dysphasia, odynophagia, melena, hematochezia, dysuria, hematuria, rash, seizure activity, orthopnea, PND, pedal edema, claudication. Remaining systems are negative.  Physical Exam: Well-developed well-nourished in no acute distress.  Skin is warm and dry.  HEENT is normal.  Neck is supple.  Chest is clear to auscultation with normal expansion.  Cardiovascular exam is irregular, Crisp mechanical valve Abdominal exam nontender or distended. No masses palpated. Extremities show no edema. neuro grossly intact  ECG Atrial fibrillation at a rate of 82. Nonspecific ST changes.

## 2015-06-05 ENCOUNTER — Ambulatory Visit (INDEPENDENT_AMBULATORY_CARE_PROVIDER_SITE_OTHER): Payer: BC Managed Care – PPO | Admitting: Cardiology

## 2015-06-05 ENCOUNTER — Encounter: Payer: Self-pay | Admitting: Cardiology

## 2015-06-05 VITALS — BP 160/90 | HR 82 | Ht 63.0 in | Wt 189.3 lb

## 2015-06-05 DIAGNOSIS — I482 Chronic atrial fibrillation, unspecified: Secondary | ICD-10-CM

## 2015-06-05 DIAGNOSIS — E785 Hyperlipidemia, unspecified: Secondary | ICD-10-CM

## 2015-06-05 DIAGNOSIS — Z79899 Other long term (current) drug therapy: Secondary | ICD-10-CM | POA: Diagnosis not present

## 2015-06-05 LAB — BASIC METABOLIC PANEL
BUN: 11 mg/dL (ref 7–25)
CALCIUM: 8.8 mg/dL (ref 8.6–10.4)
CHLORIDE: 104 mmol/L (ref 98–110)
CO2: 27 mmol/L (ref 20–31)
CREATININE: 0.75 mg/dL (ref 0.50–0.99)
Glucose, Bld: 84 mg/dL (ref 65–99)
Potassium: 4.3 mmol/L (ref 3.5–5.3)
Sodium: 140 mmol/L (ref 135–146)

## 2015-06-05 LAB — CBC
HCT: 37.7 % (ref 36.0–46.0)
Hemoglobin: 12.5 g/dL (ref 12.0–15.0)
MCH: 27.5 pg (ref 26.0–34.0)
MCHC: 33.2 g/dL (ref 30.0–36.0)
MCV: 83 fL (ref 78.0–100.0)
MPV: 11.4 fL (ref 8.6–12.4)
PLATELETS: 152 10*3/uL (ref 150–400)
RBC: 4.54 MIL/uL (ref 3.87–5.11)
RDW: 15.3 % (ref 11.5–15.5)
WBC: 5 10*3/uL (ref 4.0–10.5)

## 2015-06-05 LAB — LIPID PANEL
CHOLESTEROL: 130 mg/dL (ref 125–200)
HDL: 47 mg/dL (ref >=46)
LDL CALC: 63 mg/dL (ref <130)
TRIGLYCERIDES: 98 mg/dL (ref <150)
Total CHOL/HDL Ratio: 2.8 Ratio (ref <=5.0)
VLDL: 20 mg/dL (ref <30)

## 2015-06-05 LAB — HEPATIC FUNCTION PANEL
ALT: 11 U/L (ref 6–29)
AST: 17 U/L (ref 10–35)
Albumin: 3.6 g/dL (ref 3.6–5.1)
Alkaline Phosphatase: 106 U/L (ref 33–130)
BILIRUBIN DIRECT: 0.1 mg/dL (ref <=0.2)
Indirect Bilirubin: 0.6 mg/dL (ref 0.2–1.2)
TOTAL PROTEIN: 6.8 g/dL (ref 6.1–8.1)
Total Bilirubin: 0.7 mg/dL (ref 0.2–1.2)

## 2015-06-05 MED ORDER — WARFARIN SODIUM 5 MG PO TABS: 5.0000 mg | ORAL_TABLET | ORAL | Status: DC

## 2015-06-05 NOTE — Patient Instructions (Signed)
Dr Stanford Breed has made no changes today in your current medications or treatment plan.  Your physician recommends that you return for lab work TODAY.  Your physician recommends that you schedule a follow-up appointment in 1 year. You will receive a reminder letter in the mail two months in advance. If you don't receive a letter, please call our office to schedule the follow-up appointment.  If you need a refill on your cardiac medications before your next appointment, please call your pharmacy.

## 2015-06-05 NOTE — Assessment & Plan Note (Signed)
Blood pressure mildly elevated but she follows this at home and it is typically control. Continue present medications and follow. Check potassium and renal function.

## 2015-06-05 NOTE — Assessment & Plan Note (Signed)
Continue statin. Check lipids and liver. 

## 2015-06-05 NOTE — Assessment & Plan Note (Signed)
Patient remains in permanent atrial fibrillation. Continue beta blocker for rate control. Continue Coumadin. Check hemoglobin.

## 2015-06-05 NOTE — Assessment & Plan Note (Signed)
Continue Coumadin. Continue SBE prophylaxis.

## 2015-06-06 ENCOUNTER — Encounter: Payer: Self-pay | Admitting: *Deleted

## 2015-06-13 ENCOUNTER — Ambulatory Visit (INDEPENDENT_AMBULATORY_CARE_PROVIDER_SITE_OTHER): Payer: BC Managed Care – PPO

## 2015-06-13 DIAGNOSIS — I482 Chronic atrial fibrillation, unspecified: Secondary | ICD-10-CM

## 2015-06-13 DIAGNOSIS — Z9889 Other specified postprocedural states: Secondary | ICD-10-CM | POA: Diagnosis not present

## 2015-06-13 DIAGNOSIS — I4891 Unspecified atrial fibrillation: Secondary | ICD-10-CM | POA: Diagnosis not present

## 2015-06-13 DIAGNOSIS — Z5181 Encounter for therapeutic drug level monitoring: Secondary | ICD-10-CM | POA: Diagnosis not present

## 2015-06-13 DIAGNOSIS — I059 Rheumatic mitral valve disease, unspecified: Secondary | ICD-10-CM

## 2015-06-13 LAB — POCT INR: INR: 4.5

## 2015-06-27 ENCOUNTER — Ambulatory Visit (INDEPENDENT_AMBULATORY_CARE_PROVIDER_SITE_OTHER): Payer: BC Managed Care – PPO

## 2015-06-27 DIAGNOSIS — I482 Chronic atrial fibrillation, unspecified: Secondary | ICD-10-CM

## 2015-06-27 DIAGNOSIS — Z9889 Other specified postprocedural states: Secondary | ICD-10-CM

## 2015-06-27 DIAGNOSIS — I4891 Unspecified atrial fibrillation: Secondary | ICD-10-CM

## 2015-06-27 DIAGNOSIS — I059 Rheumatic mitral valve disease, unspecified: Secondary | ICD-10-CM

## 2015-06-27 DIAGNOSIS — Z5181 Encounter for therapeutic drug level monitoring: Secondary | ICD-10-CM

## 2015-06-27 LAB — POCT INR: INR: 2.3

## 2015-07-11 ENCOUNTER — Ambulatory Visit (INDEPENDENT_AMBULATORY_CARE_PROVIDER_SITE_OTHER): Payer: BC Managed Care – PPO | Admitting: *Deleted

## 2015-07-11 DIAGNOSIS — I059 Rheumatic mitral valve disease, unspecified: Secondary | ICD-10-CM

## 2015-07-11 DIAGNOSIS — Z9889 Other specified postprocedural states: Secondary | ICD-10-CM | POA: Diagnosis not present

## 2015-07-11 DIAGNOSIS — Z5181 Encounter for therapeutic drug level monitoring: Secondary | ICD-10-CM | POA: Diagnosis not present

## 2015-07-11 DIAGNOSIS — I482 Chronic atrial fibrillation, unspecified: Secondary | ICD-10-CM

## 2015-07-11 DIAGNOSIS — I4891 Unspecified atrial fibrillation: Secondary | ICD-10-CM | POA: Diagnosis not present

## 2015-07-11 LAB — POCT INR: INR: 2.9

## 2015-07-20 ENCOUNTER — Ambulatory Visit (INDEPENDENT_AMBULATORY_CARE_PROVIDER_SITE_OTHER): Payer: BC Managed Care – PPO | Admitting: *Deleted

## 2015-07-20 DIAGNOSIS — I059 Rheumatic mitral valve disease, unspecified: Secondary | ICD-10-CM | POA: Diagnosis not present

## 2015-07-20 DIAGNOSIS — I482 Chronic atrial fibrillation, unspecified: Secondary | ICD-10-CM

## 2015-07-20 DIAGNOSIS — I4891 Unspecified atrial fibrillation: Secondary | ICD-10-CM | POA: Diagnosis not present

## 2015-07-20 DIAGNOSIS — Z9889 Other specified postprocedural states: Secondary | ICD-10-CM

## 2015-07-20 DIAGNOSIS — Z5181 Encounter for therapeutic drug level monitoring: Secondary | ICD-10-CM

## 2015-07-20 LAB — POCT INR: INR: 2.4

## 2015-07-30 ENCOUNTER — Ambulatory Visit (INDEPENDENT_AMBULATORY_CARE_PROVIDER_SITE_OTHER): Payer: BC Managed Care – PPO | Admitting: *Deleted

## 2015-07-30 DIAGNOSIS — Z9889 Other specified postprocedural states: Secondary | ICD-10-CM

## 2015-07-30 DIAGNOSIS — I059 Rheumatic mitral valve disease, unspecified: Secondary | ICD-10-CM | POA: Diagnosis not present

## 2015-07-30 DIAGNOSIS — I4891 Unspecified atrial fibrillation: Secondary | ICD-10-CM | POA: Diagnosis not present

## 2015-07-30 DIAGNOSIS — I482 Chronic atrial fibrillation, unspecified: Secondary | ICD-10-CM

## 2015-07-30 DIAGNOSIS — Z5181 Encounter for therapeutic drug level monitoring: Secondary | ICD-10-CM | POA: Diagnosis not present

## 2015-07-30 LAB — POCT INR: INR: 2.8

## 2015-08-01 ENCOUNTER — Other Ambulatory Visit: Payer: Self-pay | Admitting: *Deleted

## 2015-08-01 MED ORDER — METOPROLOL SUCCINATE ER 100 MG PO TB24: 100.0000 mg | ORAL_TABLET | Freq: Every day | ORAL | Status: DC

## 2015-08-01 MED ORDER — ATORVASTATIN CALCIUM 40 MG PO TABS: 40.0000 mg | ORAL_TABLET | Freq: Every evening | ORAL | Status: DC

## 2015-08-15 ENCOUNTER — Ambulatory Visit (INDEPENDENT_AMBULATORY_CARE_PROVIDER_SITE_OTHER): Payer: BC Managed Care – PPO | Admitting: *Deleted

## 2015-08-15 DIAGNOSIS — I059 Rheumatic mitral valve disease, unspecified: Secondary | ICD-10-CM | POA: Diagnosis not present

## 2015-08-15 DIAGNOSIS — I482 Chronic atrial fibrillation, unspecified: Secondary | ICD-10-CM

## 2015-08-15 DIAGNOSIS — Z5181 Encounter for therapeutic drug level monitoring: Secondary | ICD-10-CM | POA: Diagnosis not present

## 2015-08-15 DIAGNOSIS — Z9889 Other specified postprocedural states: Secondary | ICD-10-CM

## 2015-08-15 DIAGNOSIS — I4891 Unspecified atrial fibrillation: Secondary | ICD-10-CM | POA: Diagnosis not present

## 2015-08-15 LAB — POCT INR: INR: 2.6

## 2015-08-29 ENCOUNTER — Ambulatory Visit (INDEPENDENT_AMBULATORY_CARE_PROVIDER_SITE_OTHER): Payer: BC Managed Care – PPO

## 2015-08-29 DIAGNOSIS — I059 Rheumatic mitral valve disease, unspecified: Secondary | ICD-10-CM | POA: Diagnosis not present

## 2015-08-29 DIAGNOSIS — Z9889 Other specified postprocedural states: Secondary | ICD-10-CM | POA: Diagnosis not present

## 2015-08-29 DIAGNOSIS — I4891 Unspecified atrial fibrillation: Secondary | ICD-10-CM | POA: Diagnosis not present

## 2015-08-29 DIAGNOSIS — Z5181 Encounter for therapeutic drug level monitoring: Secondary | ICD-10-CM

## 2015-08-29 DIAGNOSIS — I482 Chronic atrial fibrillation, unspecified: Secondary | ICD-10-CM

## 2015-08-29 LAB — POCT INR: INR: 4

## 2015-09-12 ENCOUNTER — Ambulatory Visit (INDEPENDENT_AMBULATORY_CARE_PROVIDER_SITE_OTHER): Payer: BC Managed Care – PPO | Admitting: *Deleted

## 2015-09-12 DIAGNOSIS — Z9889 Other specified postprocedural states: Secondary | ICD-10-CM | POA: Diagnosis not present

## 2015-09-12 DIAGNOSIS — I4891 Unspecified atrial fibrillation: Secondary | ICD-10-CM | POA: Diagnosis not present

## 2015-09-12 DIAGNOSIS — I482 Chronic atrial fibrillation, unspecified: Secondary | ICD-10-CM

## 2015-09-12 DIAGNOSIS — I059 Rheumatic mitral valve disease, unspecified: Secondary | ICD-10-CM | POA: Diagnosis not present

## 2015-09-12 DIAGNOSIS — Z5181 Encounter for therapeutic drug level monitoring: Secondary | ICD-10-CM

## 2015-09-12 LAB — POCT INR: INR: 2.9

## 2015-09-26 ENCOUNTER — Ambulatory Visit (INDEPENDENT_AMBULATORY_CARE_PROVIDER_SITE_OTHER): Payer: BC Managed Care – PPO

## 2015-09-26 DIAGNOSIS — Z9889 Other specified postprocedural states: Secondary | ICD-10-CM

## 2015-09-26 DIAGNOSIS — Z5181 Encounter for therapeutic drug level monitoring: Secondary | ICD-10-CM | POA: Diagnosis not present

## 2015-09-26 DIAGNOSIS — I482 Chronic atrial fibrillation, unspecified: Secondary | ICD-10-CM

## 2015-09-26 DIAGNOSIS — I4891 Unspecified atrial fibrillation: Secondary | ICD-10-CM | POA: Diagnosis not present

## 2015-09-26 DIAGNOSIS — I059 Rheumatic mitral valve disease, unspecified: Secondary | ICD-10-CM

## 2015-09-26 LAB — POCT INR: INR: 3.6

## 2015-10-02 ENCOUNTER — Ambulatory Visit (INDEPENDENT_AMBULATORY_CARE_PROVIDER_SITE_OTHER): Payer: BC Managed Care – PPO | Admitting: Family Medicine

## 2015-10-02 VITALS — BP 142/94 | HR 73 | Temp 98.1°F | Resp 16 | Ht 64.0 in | Wt 194.0 lb

## 2015-10-02 DIAGNOSIS — E7800 Pure hypercholesterolemia, unspecified: Secondary | ICD-10-CM | POA: Diagnosis not present

## 2015-10-02 DIAGNOSIS — J0100 Acute maxillary sinusitis, unspecified: Secondary | ICD-10-CM

## 2015-10-02 DIAGNOSIS — Z1239 Encounter for other screening for malignant neoplasm of breast: Secondary | ICD-10-CM | POA: Diagnosis not present

## 2015-10-02 DIAGNOSIS — G5702 Lesion of sciatic nerve, left lower limb: Secondary | ICD-10-CM | POA: Diagnosis not present

## 2015-10-02 DIAGNOSIS — Z13820 Encounter for screening for osteoporosis: Secondary | ICD-10-CM

## 2015-10-02 DIAGNOSIS — Z7901 Long term (current) use of anticoagulants: Secondary | ICD-10-CM | POA: Diagnosis not present

## 2015-10-02 DIAGNOSIS — G5700 Lesion of sciatic nerve, unspecified lower limb: Secondary | ICD-10-CM | POA: Insufficient documentation

## 2015-10-02 DIAGNOSIS — Z5181 Encounter for therapeutic drug level monitoring: Secondary | ICD-10-CM | POA: Diagnosis not present

## 2015-10-02 DIAGNOSIS — I10 Essential (primary) hypertension: Secondary | ICD-10-CM

## 2015-10-02 DIAGNOSIS — I482 Chronic atrial fibrillation, unspecified: Secondary | ICD-10-CM

## 2015-10-02 DIAGNOSIS — Z9889 Other specified postprocedural states: Secondary | ICD-10-CM

## 2015-10-02 DIAGNOSIS — L299 Pruritus, unspecified: Secondary | ICD-10-CM | POA: Diagnosis not present

## 2015-10-02 MED ORDER — FLUTICASONE PROPIONATE 50 MCG/ACT NA SUSP: 2.0000 | Freq: Every day | NASAL | Status: DC

## 2015-10-02 MED ORDER — AMOXICILLIN-POT CLAVULANATE 875-125 MG PO TABS: 1.0000 | ORAL_TABLET | Freq: Two times a day (BID) | ORAL | Status: AC

## 2015-10-02 MED ORDER — DIPHENHYDRAMINE HCL 25 MG PO TABS: 25.0000 mg | ORAL_TABLET | Freq: Four times a day (QID) | ORAL | Status: DC | PRN

## 2015-10-02 MED ORDER — DM-GUAIFENESIN ER 30-600 MG PO TB12: 1.0000 | ORAL_TABLET | Freq: Two times a day (BID) | ORAL | Status: DC

## 2015-10-02 NOTE — Assessment & Plan Note (Signed)
Anticoagulated and managed by cardiology. Last follow-up in 05/2015.

## 2015-10-02 NOTE — Assessment & Plan Note (Signed)
Managed by cardiology. Encouraged heart healthy diet.

## 2015-10-02 NOTE — Assessment & Plan Note (Signed)
BP elevated in the office.  Pt will check at home. Recheck in 1 mos. Consider adding ACE/ARB for better BP control.

## 2015-10-02 NOTE — Progress Notes (Signed)
Subjective:    Patient ID: [name], female    DOB: 1955-07-06, 60 y.o.   MRN: [id number]  HPI: [name] is a 60 y.o. female presenting on 10/02/2015 for Establish Care   HPI  Pt presents to establish care today. Previous care provider was Dr. Laurian Brim- PCP.  It has been 1 year since Her last PCP visit. Records from previous provider will be requested and reviewed. Also seeing [facility] for valve replacement, [facility]- Dr. Berenice Primas Current medical problems include:  Rheumatic Heart Disease- s/p Mitral valve replacement 1997- taking warfarin as anticoag- managed at [facility]. Sees cardiology regularly. No bloody stools, no bloody gums.  Having issues with her teeth since multple ortho fractures-  Orthopedic issues: Femur and radal fracture- November 2015- stills sees orthopedics for follow-up. Back hurts after fracture. Cannot have MRI. CT scans have not shown any issues. Pain with prolonged sitting in her buttocks. Radiates down the thigh.   Hyperlipidemia: Managed by cardiology. Taking atorvastatin daily.  Allergies and Sinus issues: Takes zyrtec daily. Helps with symptoms. R sideded facial pain. No fevers at home. Symptoms present x 3 weeks. Face hurts when she bends over. Has sinus drainage daily.  Has noticed she scratches daily since her mother passed away. She has created multiple skin tears. Doesn't itch. Feels somewhat irritable and trouble relaxing.    Health maintenance:  Mammogram- unsure. Needs.  Bone Density- never had.  Colonoscopy 2012- normal.  Pap smear- hasn't had for a while.    Past Medical History  Diagnosis Date  . Rheumatic heart disease     CARDIOLOGIST-- DR Marcello Moores [facility]  . HTN (hypertension)     unspecified  . Hypercholesterolemia   . GERD (gastroesophageal reflux disease)   . Goiter     multinodular  . Anticoagulated on warfarin   . Pulmonary hypertension, mild ([place])   . Hyperlipidemia   . History of  hyperthyroidism     2003--  PTU [place]  . Chronic atrial fibrillation ([place])     SINCE 1997  . S/P MVR (mitral valve replacement)     1997  ST JUDE--  SECONDARY TO SEVERE MV STENOSIS   Social History   Social History  . Marital Status: Divorced    Spouse Name: N/A  . Number of Children: N/A  . Years of Education: N/A   Occupational History  . Control and instrumentation engineer    Social History Main Topics  . Smoking status: Never Smoker   . Smokeless tobacco: Not on file  . Alcohol Use: No  . Drug Use: No  . Sexual Activity: Not on file   Other Topics Concern  . Not on file   Social History Narrative   Family History  Problem Relation Age of Onset  . Hypothyroidism Sister   . Gallbladder disease Sister   . Coronary artery disease    . Diabetes Mother   . Hypertension Mother   . Heart disease Father   . Stroke Father    Current Outpatient Prescriptions on File Prior to Visit  Medication Sig  . atorvastatin (LIPITOR) 40 MG tablet Take 1 tablet (40 mg total) by mouth every evening.  . ferrous sulfate 325 (65 FE) MG tablet Take 1 tablet (325 mg total) by mouth 2 (two) times daily with a meal.  . gabapentin (NEURONTIN) 300 MG capsule Take 1 capsule by mouth. Use as directed  . metoprolol succinate (TOPROL-XL) 100 MG 24 hr tablet Take 1 tablet (100  mg total) by mouth daily. Take with or immediately following a meal.  . Multiple Vitamins-Minerals (CENTRUM SILVER ADULT 50+ PO) Take 1 tablet by mouth daily.  Marland Kitchen omeprazole (PRILOSEC) 20 MG capsule Take 20 mg by mouth every morning.   . warfarin (COUMADIN) 5 MG tablet Take 1 tablet (5 mg total) by mouth See admin instructions.   No current facility-administered medications on file prior to visit.    Review of Systems  Constitutional: Negative for fever and chills.  HENT: Positive for congestion, postnasal drip, sinus pressure and sneezing.   Eyes: Positive for itching. Negative for photophobia and visual disturbance.  Respiratory: Negative  for cough, chest tightness and wheezing.   Cardiovascular: Negative for chest pain and leg swelling.  Gastrointestinal: Negative for nausea, vomiting, abdominal pain, diarrhea and constipation.  Endocrine: Negative.  Negative for cold intolerance, heat intolerance, polydipsia, polyphagia and polyuria.  Genitourinary: Negative for dysuria and difficulty urinating.  Musculoskeletal: Positive for back pain and gait problem.  Skin: Positive for rash.  Allergic/Immunologic: Positive for environmental allergies.  Neurological: Negative for dizziness, light-headedness and numbness.  Psychiatric/Behavioral: Negative for sleep disturbance. The patient is not nervous/anxious.    Per HPI unless specifically indicated above     Objective:     Depression screen [facility] 2/9 10/02/2015 10/02/2015  Decreased Interest 0 0  Down, Depressed, Hopeless 0 0  PHQ - 2 Score 0 0  Altered sleeping 1 -  Tired, decreased energy 0 -  Change in appetite 2 -  Feeling bad or failure about yourself  0 -  Trouble concentrating 0 -  Moving slowly or fidgety/restless 0 -  Suicidal thoughts 0 -  PHQ-9 Score 3 -  Difficult doing work/chores Somewhat difficult -     BP 142/94 mmHg  Pulse 73  Temp(Src) 98.1 F (36.7 C) (Oral)  Resp 16  Ht 5\' 4"  (1.626 m)  Wt 194 lb (87.998 kg)  BMI 33.28 kg/m2  Wt Readings from Last 3 Encounters:  10/02/15 194 lb (87.998 kg)  06/05/15 189 lb 5 oz (85.872 kg)  06/05/14 182 lb 11.2 oz (82.872 kg)    Physical Exam  Constitutional: She is oriented to person, place, and time. She appears well-developed and well-nourished.  HENT:  Head: Normocephalic and atraumatic.  Right Ear: Hearing and tympanic membrane normal.  Left Ear: Hearing and tympanic membrane normal.  Nose: Mucosal edema and rhinorrhea present. Right sinus exhibits maxillary sinus tenderness (exquisite tenderness.). Right sinus exhibits no frontal sinus tenderness. Left sinus exhibits no maxillary sinus tenderness and no  frontal sinus tenderness.  Mouth/Throat: Uvula is midline, oropharynx is clear and moist and mucous membranes are normal.  Neck: Neck supple.  Cardiovascular: Normal rate, regular rhythm, normal heart sounds and normal pulses.  Exam reveals no gallop and no friction rub.   No murmur heard. Click consistent with artificial valve.   Pulmonary/Chest: Effort normal and breath sounds normal. She has no wheezes. She exhibits no tenderness.  Abdominal: Soft. Normal appearance and bowel sounds are normal. She exhibits no distension and no mass. There is no tenderness. There is no rebound and no guarding.  Musculoskeletal: She exhibits no edema.       Right ankle: She exhibits decreased range of motion (due to previou fracture).       Left ankle: She exhibits decreased range of motion (due to previous fracture.).       Lumbar back: She exhibits tenderness (at level of piriformis muscle. ). She exhibits no bony tenderness, no  swelling, no pain and normal pulse.  Decreased ROM RLE due to femur fracture.   Lymphadenopathy:    She has no cervical adenopathy.  Neurological: She is alert and oriented to person, place, and time.  Skin: Skin is warm and dry.  Multiple skin tears on upper arms from scratching.    Psychiatric: She has a normal mood and affect. Her behavior is normal. Judgment and thought content normal.   Results for orders placed or performed in visit on 09/26/15  POCT INR  Result Value Ref Range   INR 3.6       Assessment & Plan:   Problem List Items Addressed This Visit      Cardiovascular and Mediastinum   Essential hypertension    BP elevated in the office.  Pt will check at home. Recheck in 1 mos. Consider adding ACE/ARB for better BP control.       Chronic atrial fibrillation ([place])    Managed by cardiology. Avoiding decongestants for her sinus infection at this time.         Nervous and Auditory   Piriformis syndrome - Primary    Symptoms consistent with piriformis  syndrome.  Pt unable to take NSAIDs due to warfarin. Discussed option of prednisone for inflammation- she declined due to possible impact on INR. Plan for gentle stretching and tylenol. Consider PT if not improving.         Other   HYPERCHOLESTEROLEMIA    Managed by cardiology. Encouraged heart healthy diet.       MITRAL VALVE REPLACEMENT, HX OF    Anticoagulated and managed by cardiology. Last follow-up in 05/2015.      Anticoagulated on Coumadin    On warfarin 5mg  once daily. Sees cardiology of coagulation management.       Other Visit Diagnoses    Acute maxillary sinusitis, recurrence not specified  : Treat for sinus infection due to 3 weeks persistent symp. Plan for Augmentin BID for 5 days. Mucinex DM and start flonase. Return if symptoms not improved.       Relevant Medications    dextromethorphan-guaiFENesin (MUCINEX DM) 30-600 MG 12hr tablet    amoxicillin-clavulanate (AUGMENTIN) 875-125 MG tablet    fluticasone (FLONASE) 50 MCG/ACT nasal spray    diphenhydrAMINE (BENADRYL) 25 MG tablet    Screening for osteoporosis        Bone density scan ordered. Pt will contact [facility].     Relevant Orders    DG Bone Density    Screening for breast cancer        Mammogram ordered. Pt will contact [facility].     Relevant Orders    MM DIGITAL SCREENING BILATERAL    Itching        Likely 2/2 stress reaction from Mother's death. Discussed options of counseling, medication, or trying gloves and benadryl at night. pt would like to try benadryl. Consider counseling or medication if not improving.     Relevant Medications    diphenhydrAMINE (BENADRYL) 25 MG tablet       Meds ordered this encounter  Medications  . calcium carbonate (OSCAL) 1500 (600 Ca) MG TABS tablet    Sig: Take by mouth daily.  Marland Kitchen dextromethorphan-guaiFENesin (MUCINEX DM) 30-600 MG 12hr tablet    Sig: Take 1 tablet by mouth 2 (two) times daily.    Dispense:  20 tablet    Refill:  0    Order  Specific Question:  Supervising Provider    Answer:  Philbert Riser,  JAMES H [id number]  . amoxicillin-clavulanate (AUGMENTIN) 875-125 MG tablet    Sig: Take 1 tablet by mouth 2 (two) times daily. For 5 days.    Dispense:  10 tablet    Refill:  0    Order Specific Question:  Supervising Provider    Answer:  Arlis Porta [phone]  . fluticasone (FLONASE) 50 MCG/ACT nasal spray    Sig: Place 2 sprays into both nostrils daily.    Dispense:  16 g    Refill:  11    Order Specific Question:  Supervising Provider    Answer:  Arlis Porta [phone]  . diphenhydrAMINE (BENADRYL) 25 MG tablet    Sig: Take 1 tablet (25 mg total) by mouth every 6 (six) hours as needed.    Dispense:  30 tablet    Refill:  0    Order Specific Question:  Supervising Provider    Answer:  Arlis Porta [id number]      Follow up plan: Return in about 4 weeks (around 10/30/2015) for BP check. Marland Kitchen

## 2015-10-02 NOTE — Assessment & Plan Note (Signed)
Managed by cardiology. Avoiding decongestants for her sinus infection at this time.

## 2015-10-02 NOTE — Patient Instructions (Addendum)
[facility] [address], [place], [place] [address] Phone: [phone] Fax: [phone] E-mail: [email or web address]  Hours of Operation 8:00 am to 5:00 pm, Monday - Friday  Your goal blood pressure is 140/90. Work on low salt/sodium diet - goal <1.5gm (1,500mg ) per day. Eat a diet high in fruits/vegetables and whole grains.  Look into mediterranean and DASH diet. Goal activity is 157min/wk of moderate intensity exercise.  This can be split into 30 minute chunks.  If you are not at this level, you can start with smaller 10-15 min increments and slowly build up activity. Look at [email or web address] for more resources  Please seek immediate medical attention at ER or Urgent Care if you develop: Chest pain, pressure or tightness. Shortness of breath accompanied by nausea or diaphoresis Visual changes Numbness or tingling on one side of the body Facial droop Altered mental status Or any concerning symptoms.

## 2015-10-02 NOTE — Assessment & Plan Note (Signed)
On warfarin 5mg  once daily. Sees cardiology of coagulation management.

## 2015-10-02 NOTE — Assessment & Plan Note (Signed)
Symptoms consistent with piriformis syndrome.  Pt unable to take NSAIDs due to warfarin. Discussed option of prednisone for inflammation- she declined due to possible impact on INR. Plan for gentle stretching and tylenol. Consider PT if not improving.

## 2015-10-03 ENCOUNTER — Telehealth: Payer: Self-pay | Admitting: Family Medicine

## 2015-10-03 NOTE — Telephone Encounter (Signed)
Spoke with [name] and assured order will be faxed in the am. Need signature.

## 2015-10-03 NOTE — Telephone Encounter (Signed)
Debbie from [facility] said a referral was sent for pt the have mammo and bone density.  She doesn't have to have an order for the mammo but it can be included on the bone density order.  Her call back number is [phone] and fax number is [phone]

## 2015-10-10 ENCOUNTER — Ambulatory Visit (INDEPENDENT_AMBULATORY_CARE_PROVIDER_SITE_OTHER): Payer: BC Managed Care – PPO

## 2015-10-10 DIAGNOSIS — I059 Rheumatic mitral valve disease, unspecified: Secondary | ICD-10-CM | POA: Diagnosis not present

## 2015-10-10 DIAGNOSIS — I482 Chronic atrial fibrillation, unspecified: Secondary | ICD-10-CM

## 2015-10-10 DIAGNOSIS — Z5181 Encounter for therapeutic drug level monitoring: Secondary | ICD-10-CM

## 2015-10-10 DIAGNOSIS — I4891 Unspecified atrial fibrillation: Secondary | ICD-10-CM

## 2015-10-10 DIAGNOSIS — Z9889 Other specified postprocedural states: Secondary | ICD-10-CM | POA: Diagnosis not present

## 2015-10-10 LAB — POCT INR: INR: 3.7

## 2015-10-11 ENCOUNTER — Other Ambulatory Visit: Payer: Self-pay | Admitting: Family Medicine

## 2015-10-11 DIAGNOSIS — R921 Mammographic calcification found on diagnostic imaging of breast: Secondary | ICD-10-CM

## 2015-10-15 ENCOUNTER — Ambulatory Visit (INDEPENDENT_AMBULATORY_CARE_PROVIDER_SITE_OTHER): Payer: BC Managed Care – PPO | Admitting: General Surgery

## 2015-10-15 ENCOUNTER — Encounter: Payer: Self-pay | Admitting: General Surgery

## 2015-10-15 VITALS — BP 128/74 | HR 74 | Resp 12 | Ht 64.0 in | Wt 197.0 lb

## 2015-10-15 DIAGNOSIS — R920 Mammographic microcalcification found on diagnostic imaging of breast: Secondary | ICD-10-CM | POA: Diagnosis not present

## 2015-10-15 NOTE — Progress Notes (Addendum)
Patient ID: [name], female   DOB: 10-14-1955, 60 y.o.   MRN: [id number]  Chief Complaint  Patient presents with  . Other    mammogram    HPI [name] is a 60 y.o. female who presents for a breast evaluation. The most recent mammogram was done on 10/08/15.  Patient does perform regular self breast checks and does not gets regular mammograms done. No family history of breast cancer.    I personally reviewed the patient's history. HPI  Past Medical History  Diagnosis Date  . Rheumatic heart disease     CARDIOLOGIST-- DR Marcello Moores WALL  . HTN (hypertension)     unspecified  . Hypercholesterolemia   . GERD (gastroesophageal reflux disease)   . Goiter     multinodular  . Anticoagulated on warfarin   . Pulmonary hypertension, mild ([place])   . Hyperlipidemia   . History of hyperthyroidism     2003--  PTU [place]  . Chronic atrial fibrillation ([place])     SINCE 1997  . S/P MVR (mitral valve replacement)     1997  ST JUDE--  SECONDARY TO SEVERE MV STENOSIS    Past Surgical History  Procedure Laterality Date  . Wrist arthroscopy Right 12-10-2007    debridement and ulnar shortening osteotomy w/ plate  . Carpal tunnel release Right 01-20-2007  . Ercp  08/15/2011    Procedure: ENDOSCOPIC RETROGRADE CHOLANGIOPANCREATOGRAPHY (ERCP);  Surgeon: Jeryl Columbia, MD;  Location: [facility] ENDOSCOPY;  Service: Endoscopy;  Laterality: N/A;  . Orif ankle fracture Right 11/05/2012    Procedure: OPEN REDUCTION INTERNAL FIXATION (ORIF) ANKLE FRACTURE only operating on right;  Surgeon: Alta [place], MD;  Location: [place];  Service: Orthopedics;  Laterality: Right;  . Anterior cervical decomp/discectomy fusion  12-21-2001    C5 --  C6  . Laparoscopic cholecystectomy  04-08-2002  . Cardiac catheterization  12-28-2006  DR [facility]    NORMAL CORONARY ARTERIES  . Mitral valve replacement  1997    ST JUDE  . Orif right ulnar fx w/ iliac crest bone graft  10-06-2008  . Ercp      MULTIPLE--  INCLUDING  STENTS, SPHINTEROTOMY'S  STONE REMOVAL   . Transthoracic echocardiogram  06-29-2009  dr wall    normal lvsf/ ef 55-60%/  normal bileaflet mechanical mvr/ moderated dilated la/ moderate tr  . I&d extremity Right 02/07/2013    Procedure: IRRIGATION AND DEBRIDEMENT OF RIGHT LOWER LEG WITH PLACEMENT OF ACELL AND WOULND VAC;  Surgeon: Theodoro Kos, DO;  Location: [place];  Service: Plastics;  Laterality: Right;  . Femur im nail Right 04/26/2014    Procedure: INTRAMEDULLARY (IM) NAIL FEMORAL;  Surgeon: Alta Corning, MD;  Location: [place];  Service: Orthopedics;  Laterality: Right;  . Orif wrist fracture Right 04/26/2014    Procedure: OPEN REDUCTION INTERNAL FIXATION (ORIF) WRIST FRACTURE;  Surgeon: Alta Corning, MD;  Location: [place];  Service: Orthopedics;  Laterality: Right;  . Colonoscopy  2015    Family History  Problem Relation Age of Onset  . Hypothyroidism Sister   . Gallbladder disease Sister   . Coronary artery disease    . Diabetes Mother   . Hypertension Mother   . Heart disease Father   . Stroke Father     Social History Social History  Substance Use Topics  . Smoking status: Never Smoker   . Smokeless tobacco: None  . Alcohol Use: No    No Known Allergies  Current  Outpatient Prescriptions  Medication Sig Dispense Refill  . atorvastatin (LIPITOR) 40 MG tablet Take 1 tablet (40 mg total) by mouth every evening. 90 tablet 3  . calcium carbonate (OSCAL) 1500 (600 Ca) MG TABS tablet Take by mouth daily.    . diphenhydrAMINE (BENADRYL) 25 MG tablet Take 1 tablet (25 mg total) by mouth every 6 (six) hours as needed. 30 tablet 0  . gabapentin (NEURONTIN) 300 MG capsule Take 1 capsule by mouth. Use as directed    . metoprolol succinate (TOPROL-XL) 100 MG 24 hr tablet Take 1 tablet (100 mg total) by mouth daily. Take with or immediately following a meal. 90 tablet 3  . Multiple Vitamins-Minerals (CENTRUM SILVER ADULT 50+ PO) Take 1 tablet by mouth daily.    Marland Kitchen  omeprazole (PRILOSEC) 20 MG capsule Take 20 mg by mouth every morning.     . warfarin (COUMADIN) 5 MG tablet Take 1 tablet (5 mg total) by mouth See admin instructions. 120 tablet 1  . fluticasone (FLONASE) 50 MCG/ACT nasal spray Place 2 sprays into both nostrils daily. (Patient not taking: Reported on 10/15/2015) 16 g 11   No current facility-administered medications for this visit.    Review of Systems Review of Systems  Constitutional: Negative.   Respiratory: Negative.   Cardiovascular: Negative.     Blood pressure 128/74, pulse 74, resp. rate 12, height 5\' 4"  (1.626 m), weight 197 lb (89.359 kg).  Physical Exam Physical Exam  Constitutional: She is oriented to person, place, and time. She appears well-developed and well-nourished.  Eyes: Conjunctivae are normal. No scleral icterus.  Neck: Neck supple.  Cardiovascular: Normal rate.  An irregular rhythm present.  Pulmonary/Chest: Effort normal and breath sounds normal. Right breast exhibits no inverted nipple, no mass, no nipple discharge, no skin change and no tenderness. Left breast exhibits no inverted nipple, no mass, no nipple discharge, no skin change and no tenderness.  Abdominal: Soft. Bowel sounds are normal. There is no tenderness.  Lymphadenopathy:    She has no cervical adenopathy.    She has no axillary adenopathy.  Neurological: She is alert and oriented to person, place, and time.  Skin: Skin is warm and dry.    Data Reviewed 10/10/2015 PT/INR: 3.7.  Left breast diagnostic mammogram dated 10/09/2015 was reviewed. Fine pleomorphic calcifications noted in the upper-outer quadrant of the left breast measuring 0.4 cm in diameter. BI-RADS-4.  Screening mammograms dated 10/08/2015 suggested new microcalcifications in the left breast. Prior exam greater than 10 years ago. BI-RADS-0.  Assessment    Microcalcifications left breast in a patient on chronic Coumadin therapy.    First screening study in 10  years.  Plan     options for management reviewed: 1) 6 month follow-up versus 2) biopsy on Coumadin. The calcifications are indeterminate. Risk for invasive malignancy is low. Chance for in situ carcinoma is low.  Considering her ongoing needs for anticoagulation with a St. Jude valve, I think it reasonable to undertake a 6 month follow-up study. The patient will call if valves concerns between now and October, otherwise follow up mammogram of the left breast in 6 months.     Patient to return in six months left breast diagnotic mammogram. PCP:  [facility],  This information has been scribed by Gaspar Cola CMA.   Robert Bellow 10/24/2015, 5:10 PM

## 2015-10-15 NOTE — Patient Instructions (Signed)
Patient to return in six months left breast diagnotic mammogram.  

## 2015-10-30 ENCOUNTER — Ambulatory Visit (INDEPENDENT_AMBULATORY_CARE_PROVIDER_SITE_OTHER): Payer: BC Managed Care – PPO | Admitting: Family Medicine

## 2015-10-30 ENCOUNTER — Encounter: Payer: Self-pay | Admitting: Family Medicine

## 2015-10-30 VITALS — BP 158/85 | HR 73 | Temp 98.1°F | Resp 16 | Ht 64.0 in | Wt 196.0 lb

## 2015-10-30 DIAGNOSIS — I10 Essential (primary) hypertension: Secondary | ICD-10-CM

## 2015-10-30 DIAGNOSIS — L299 Pruritus, unspecified: Secondary | ICD-10-CM | POA: Diagnosis not present

## 2015-10-30 MED ORDER — LORATADINE 10 MG PO TABS: 10.0000 mg | ORAL_TABLET | Freq: Every day | ORAL | Status: DC

## 2015-10-30 MED ORDER — LISINOPRIL 10 MG PO TABS
10.0000 mg | ORAL_TABLET | Freq: Every day | ORAL | Status: DC
Start: 2015-10-30 — End: 2016-03-13

## 2015-10-30 NOTE — Assessment & Plan Note (Signed)
Start ACE to control blood pressure. Lisinopril 10mg  daily. Recheck BP and potassium at next visit in 4 weeks. Pt encouraged to continue to check BP at home.

## 2015-10-30 NOTE — Patient Instructions (Addendum)
Your goal blood pressure is 100/60 to 140/90 Work on low salt/sodium diet - goal <1.5gm (1,500mg ) per day. Eat a diet high in fruits/vegetables and whole grains.  Look into mediterranean and DASH diet. Goal activity is 158min/wk of moderate intensity exercise.  This can be split into 30 minute chunks.  If you are not at this level, you can start with smaller 10-15 min increments and slowly build up activity. Look at [place].org for more resources  Please seek immediate medical attention at ER or Urgent Care if you develop: Chest pain, pressure or tightness. Shortness of breath accompanied by nausea or diaphoresis Visual changes Numbness or tingling on one side of the body Facial droop Altered mental status Or any concerning symptoms.  Itching: try claritin in the AM and benadryl at night to help with itching.

## 2015-10-30 NOTE — Progress Notes (Signed)
Subjective:    Patient ID: [name], female    DOB: 1956/05/27, 60 y.o.   MRN: [id number]  HPI: [name] is a 60 y.o. female presenting on 10/30/2015 for Hypertension   HPI  Pt presents for follow-up of hypertension.  Checking BP at home. Low 125/60- avg 150/90.  No HA, visual changes, or chest pain. Taking metoprolol daily to for rate control and blood pressure.  Pt also still having some itching at night. Improved with benadryl. Cannot take during the day, it makes her sleepy. Is not taking claritin.   Past Medical History  Diagnosis Date  . Rheumatic heart disease     CARDIOLOGIST-- DR Marcello Moores WALL  . HTN (hypertension)     unspecified  . Hypercholesterolemia   . GERD (gastroesophageal reflux disease)   . Goiter     multinodular  . Anticoagulated on warfarin   . Pulmonary hypertension, mild ([place])   . Hyperlipidemia   . History of hyperthyroidism     2003--  PTU [place]  . Chronic atrial fibrillation ([place])     SINCE 1997  . S/P MVR (mitral valve replacement)     1997  ST JUDE--  SECONDARY TO SEVERE MV STENOSIS    Current Outpatient Prescriptions on File Prior to Visit  Medication Sig  . atorvastatin (LIPITOR) 40 MG tablet Take 1 tablet (40 mg total) by mouth every evening.  . calcium carbonate (OSCAL) 1500 (600 Ca) MG TABS tablet Take by mouth daily.  . diphenhydrAMINE (BENADRYL) 25 MG tablet Take 1 tablet (25 mg total) by mouth every 6 (six) hours as needed.  . fluticasone (FLONASE) 50 MCG/ACT nasal spray Place 2 sprays into both nostrils daily.  Marland Kitchen gabapentin (NEURONTIN) 300 MG capsule Take 1 capsule by mouth. Use as directed  . metoprolol succinate (TOPROL-XL) 100 MG 24 hr tablet Take 1 tablet (100 mg total) by mouth daily. Take with or immediately following a meal.  . Multiple Vitamins-Minerals (CENTRUM SILVER ADULT 50+ PO) Take 1 tablet by mouth daily.  Marland Kitchen omeprazole (PRILOSEC) 20 MG capsule Take 20 mg by mouth every morning.   . warfarin (COUMADIN) 5 MG  tablet Take 1 tablet (5 mg total) by mouth See admin instructions.   No current facility-administered medications on file prior to visit.    Review of Systems  Constitutional: Negative for fever and chills.  HENT: Negative.   Respiratory: Negative for cough, chest tightness and wheezing.   Cardiovascular: Negative for chest pain and leg swelling.  Gastrointestinal: Negative for nausea, vomiting, abdominal pain, diarrhea and constipation.  Endocrine: Negative.  Negative for cold intolerance, heat intolerance, polydipsia, polyphagia and polyuria.  Genitourinary: Negative for dysuria and difficulty urinating.  Musculoskeletal: Negative.   Neurological: Negative for dizziness, light-headedness and numbness.  Psychiatric/Behavioral: Negative.    Per HPI unless specifically indicated above     Objective:    BP 158/85 mmHg  Pulse 73  Temp(Src) 98.1 F (36.7 C) (Oral)  Resp 16  Ht 5\' 4"  (1.626 m)  Wt 196 lb (88.905 kg)  BMI 33.63 kg/m2  Wt Readings from Last 3 Encounters:  10/30/15 196 lb (88.905 kg)  10/15/15 197 lb (89.359 kg)  10/02/15 194 lb (87.998 kg)    Physical Exam  Constitutional: She is oriented to person, place, and time. She appears well-developed and well-nourished.  HENT:  Head: Normocephalic and atraumatic.  Neck: Neck supple.  Cardiovascular: Normal rate, regular rhythm and normal heart sounds.  Exam reveals no gallop and no  friction rub.   No murmur heard. Click- heard best at LSB.   Pulmonary/Chest: Effort normal and breath sounds normal. She has no wheezes. She exhibits no tenderness.  Abdominal: Soft. Normal appearance and bowel sounds are normal. She exhibits no distension and no mass. There is no tenderness. There is no rebound and no guarding.  Musculoskeletal: Normal range of motion. She exhibits no edema or tenderness.  Lymphadenopathy:    She has no cervical adenopathy.  Neurological: She is alert and oriented to person, place, and time.  Skin: Skin  is warm and dry.   Results for orders placed or performed in visit on 10/10/15  POCT INR  Result Value Ref Range   INR 3.7       Assessment & Plan:   Problem List Items Addressed This Visit      Cardiovascular and Mediastinum   Essential hypertension - Primary    Start ACE to control blood pressure. Lisinopril 10mg  daily. Recheck BP and potassium at next visit in 4 weeks. Pt encouraged to continue to check BP at home.       Relevant Medications   lisinopril (PRINIVIL,ZESTRIL) 10 MG tablet    Other Visit Diagnoses    Itching        Add claritin in the AM.     Relevant Medications    loratadine (CLARITIN) 10 MG tablet       Meds ordered this encounter  Medications  . lisinopril (PRINIVIL,ZESTRIL) 10 MG tablet    Sig: Take 1 tablet (10 mg total) by mouth daily.    Dispense:  30 tablet    Refill:  11  . loratadine (CLARITIN) 10 MG tablet    Sig: Take 1 tablet (10 mg total) by mouth daily.    Dispense:  30 tablet    Refill:  11      Follow up plan: Return in about 4 weeks (around 11/27/2015) for blood pressure. Marland Kitchen

## 2015-11-07 ENCOUNTER — Ambulatory Visit (INDEPENDENT_AMBULATORY_CARE_PROVIDER_SITE_OTHER): Payer: BC Managed Care – PPO

## 2015-11-07 DIAGNOSIS — I4891 Unspecified atrial fibrillation: Secondary | ICD-10-CM | POA: Diagnosis not present

## 2015-11-07 DIAGNOSIS — I482 Chronic atrial fibrillation, unspecified: Secondary | ICD-10-CM

## 2015-11-07 DIAGNOSIS — Z9889 Other specified postprocedural states: Secondary | ICD-10-CM

## 2015-11-07 DIAGNOSIS — Z5181 Encounter for therapeutic drug level monitoring: Secondary | ICD-10-CM | POA: Diagnosis not present

## 2015-11-07 DIAGNOSIS — I059 Rheumatic mitral valve disease, unspecified: Secondary | ICD-10-CM | POA: Diagnosis not present

## 2015-11-07 LAB — POCT INR: INR: 2.8

## 2015-11-23 ENCOUNTER — Telehealth: Payer: Self-pay | Admitting: *Deleted

## 2015-11-23 NOTE — Telephone Encounter (Signed)
Amber from [employer] called and stated patient needs repeat colonoscopy. NPI was given .

## 2015-12-04 ENCOUNTER — Ambulatory Visit (INDEPENDENT_AMBULATORY_CARE_PROVIDER_SITE_OTHER): Payer: BC Managed Care – PPO | Admitting: Family Medicine

## 2015-12-04 ENCOUNTER — Encounter: Payer: Self-pay | Admitting: Family Medicine

## 2015-12-04 VITALS — BP 132/78 | HR 75 | Temp 98.3°F | Resp 16 | Ht 64.0 in | Wt 196.0 lb

## 2015-12-04 DIAGNOSIS — I10 Essential (primary) hypertension: Secondary | ICD-10-CM

## 2015-12-04 LAB — BASIC METABOLIC PANEL WITH GFR
BUN: 10 mg/dL (ref 7–25)
CO2: 25 mmol/L (ref 20–31)
CREATININE: 0.92 mg/dL (ref 0.50–0.99)
Calcium: 8.8 mg/dL (ref 8.6–10.4)
Chloride: 106 mmol/L (ref 98–110)
GFR, EST AFRICAN AMERICAN: 77 mL/min (ref >=60)
GFR, Est Non African American: 67 mL/min (ref >=60)
Glucose, Bld: 109 mg/dL — ABNORMAL HIGH (ref 65–99)
Potassium: 4.3 mmol/L (ref 3.5–5.3)
Sodium: 140 mmol/L (ref 135–146)

## 2015-12-04 NOTE — Progress Notes (Signed)
Subjective:    Patient ID: [name], female    DOB: Aug 05, 1955, 60 y.o.   MRN: [id number]  HPI: LADAYSHA [name] is a 60 y.o. female presenting on 12/04/2015 for Hypertension   HPI  Pt presents for blood pressure recheck. Doing well at home. No cough or dizziness. BP numbers at home- avg 135/80.  No chest pain. No SOB. Will need BMP today to check K levels with lisinopril.   Past Medical History  Diagnosis Date  . Rheumatic heart disease     CARDIOLOGIST-- DR Marcello Moores WALL  . HTN (hypertension)     unspecified  . Hypercholesterolemia   . GERD (gastroesophageal reflux disease)   . Goiter     multinodular  . Anticoagulated on warfarin   . Pulmonary hypertension, mild ([place])   . Hyperlipidemia   . History of hyperthyroidism     2003--  [place]U [place]  . Chronic atrial fibrillation ([place])     SINCE 1997  . S/P MVR (mitral valve replacement)     1997  ST JUDE--  SECONDARY TO SEVERE MV STENOSIS    Current Outpatient Prescriptions on File Prior to Visit  Medication Sig  . atorvastatin (LIPITOR) 40 MG tablet Take 1 tablet (40 mg total) by mouth every evening.  . calcium carbonate (OSCAL) 1500 (600 Ca) MG TABS tablet Take by mouth daily.  . diphenhydrAMINE (BENADRYL) 25 MG tablet Take 1 tablet (25 mg total) by mouth every 6 (six) hours as needed.  . fluticasone (FLONASE) 50 MCG/ACT nasal spray Place 2 sprays into both nostrils daily.  Marland Kitchen gabapentin (NEURONTIN) 300 MG capsule Take 1 capsule by mouth. Use as directed  . lisinopril (PRINIVIL,ZESTRIL) 10 MG tablet Take 1 tablet (10 mg total) by mouth daily.  Marland Kitchen loratadine (CLARITIN) 10 MG tablet Take 1 tablet (10 mg total) by mouth daily.  . metoprolol succinate (TOPROL-XL) 100 MG 24 hr tablet Take 1 tablet (100 mg total) by mouth daily. Take with or immediately following a meal.  . Multiple Vitamins-Minerals (CENTRUM SILVER ADULT 50+ PO) Take 1 tablet by mouth daily.  Marland Kitchen omeprazole (PRILOSEC) 20 MG capsule Take 20 mg by mouth every morning.    . warfarin (COUMADIN) 5 MG tablet Take 1 tablet (5 mg total) by mouth See admin instructions.   No current facility-administered medications on file prior to visit.    Review of Systems  Constitutional: Negative for fever and chills.  HENT: Negative.   Respiratory: Negative for cough, chest tightness and wheezing.   Cardiovascular: Negative for chest pain and leg swelling.  Gastrointestinal: Negative for nausea, vomiting, abdominal pain, diarrhea and constipation.  Endocrine: Negative.  Negative for cold intolerance, heat intolerance, polydipsia, polyphagia and polyuria.  Genitourinary: Negative for dysuria and difficulty urinating.  Musculoskeletal: Negative.   Neurological: Negative for dizziness, light-headedness and numbness.  Psychiatric/Behavioral: Negative.    Per HPI unless specifically indicated above     Objective:    BP 132/78 mmHg  Pulse 75  Temp(Src) 98.3 F (36.8 C) (Oral)  Resp 16  Ht 5\' 4"  (1.626 m)  Wt 196 lb (88.905 kg)  BMI 33.63 kg/m2  Wt Readings from Last 3 Encounters:  12/04/15 196 lb (88.905 kg)  10/30/15 196 lb (88.905 kg)  10/15/15 197 lb (89.359 kg)    Physical Exam  Constitutional: She is oriented to person, place, and time. She appears well-developed and well-nourished.  HENT:  Head: Normocephalic and atraumatic.  Neck: Neck supple.  Cardiovascular: Normal rate, regular rhythm, S1  normal and S2 normal.  Exam reveals no gallop and no friction rub.   No murmur heard. Click  Pulmonary/Chest: Effort normal and breath sounds normal. She has no wheezes. She exhibits no tenderness.  Abdominal: Soft. Normal appearance and bowel sounds are normal. She exhibits no distension and no mass. There is no tenderness. There is no rebound and no guarding.  Musculoskeletal: Normal range of motion. She exhibits no edema or tenderness.  Lymphadenopathy:    She has no cervical adenopathy.  Neurological: She is alert and oriented to person, place, and time.    Skin: Skin is warm and dry.  Psychiatric: She has a normal mood and affect. Her behavior is normal. Judgment and thought content normal.   Results for orders placed or performed in visit on 11/07/15  POCT INR  Result Value Ref Range   INR 2.8       Assessment & Plan:   Problem List Items Addressed This Visit      Cardiovascular and Mediastinum   Essential hypertension - Primary    Controlled today. Continue lisinopril once daily. Reviewed DASH diet. Check BMP for K levels. Recheck 3 mos.       Relevant Orders   BASIC METABOLIC PANEL WITH GFR      Meds ordered this encounter  Medications  . HYDROcodone-acetaminophen (NORCO/VICODIN) 5-325 MG tablet    Sig: Take 1 tablet by mouth as needed. Reported on 12/04/2015      Follow up plan: Return in about 3 months (around 03/05/2016) for BP check. Marland Kitchen

## 2015-12-04 NOTE — Patient Instructions (Signed)
Your goal blood pressure is 100/60 -140/90 . Work on low salt/sodium diet - goal <1.5gm (1,500mg ) per day. Eat a diet high in fruits/vegetables and whole grains.  Look into mediterranean and DASH diet. Goal activity is 153min/wk of moderate intensity exercise.  This can be split into 30 minute chunks.  If you are not at this level, you can start with smaller 10-15 min increments and slowly build up activity. Look at [place].org for more resources  Please seek immediate medical attention at ER or Urgent Care if you develop: Chest pain, pressure or tightness. Shortness of breath accompanied by nausea or diaphoresis Visual changes Numbness or tingling on one side of the body Facial droop Altered mental status Or any concerning symptoms.

## 2015-12-04 NOTE — Assessment & Plan Note (Signed)
Controlled today. Continue lisinopril once daily. Reviewed DASH diet. Check BMP for K levels. Recheck 3 mos.

## 2015-12-05 ENCOUNTER — Ambulatory Visit (INDEPENDENT_AMBULATORY_CARE_PROVIDER_SITE_OTHER): Payer: BC Managed Care – PPO

## 2015-12-05 DIAGNOSIS — I4891 Unspecified atrial fibrillation: Secondary | ICD-10-CM

## 2015-12-05 DIAGNOSIS — I482 Chronic atrial fibrillation, unspecified: Secondary | ICD-10-CM

## 2015-12-05 DIAGNOSIS — I059 Rheumatic mitral valve disease, unspecified: Secondary | ICD-10-CM

## 2015-12-05 DIAGNOSIS — Z9889 Other specified postprocedural states: Secondary | ICD-10-CM

## 2015-12-05 DIAGNOSIS — Z5181 Encounter for therapeutic drug level monitoring: Secondary | ICD-10-CM | POA: Diagnosis not present

## 2015-12-05 LAB — POCT INR: INR: 1.2

## 2015-12-12 ENCOUNTER — Ambulatory Visit (INDEPENDENT_AMBULATORY_CARE_PROVIDER_SITE_OTHER): Payer: BC Managed Care – PPO

## 2015-12-12 DIAGNOSIS — I482 Chronic atrial fibrillation, unspecified: Secondary | ICD-10-CM

## 2015-12-12 DIAGNOSIS — Z5181 Encounter for therapeutic drug level monitoring: Secondary | ICD-10-CM

## 2015-12-12 DIAGNOSIS — Z9889 Other specified postprocedural states: Secondary | ICD-10-CM

## 2015-12-12 DIAGNOSIS — I4891 Unspecified atrial fibrillation: Secondary | ICD-10-CM | POA: Diagnosis not present

## 2015-12-12 DIAGNOSIS — I059 Rheumatic mitral valve disease, unspecified: Secondary | ICD-10-CM

## 2015-12-12 LAB — POCT INR: INR: 3.6

## 2016-01-02 ENCOUNTER — Ambulatory Visit (INDEPENDENT_AMBULATORY_CARE_PROVIDER_SITE_OTHER): Payer: BC Managed Care – PPO | Admitting: *Deleted

## 2016-01-02 DIAGNOSIS — I4891 Unspecified atrial fibrillation: Secondary | ICD-10-CM | POA: Diagnosis not present

## 2016-01-02 DIAGNOSIS — I482 Chronic atrial fibrillation, unspecified: Secondary | ICD-10-CM

## 2016-01-02 DIAGNOSIS — Z9889 Other specified postprocedural states: Secondary | ICD-10-CM | POA: Diagnosis not present

## 2016-01-02 DIAGNOSIS — I059 Rheumatic mitral valve disease, unspecified: Secondary | ICD-10-CM | POA: Diagnosis not present

## 2016-01-02 DIAGNOSIS — Z5181 Encounter for therapeutic drug level monitoring: Secondary | ICD-10-CM | POA: Diagnosis not present

## 2016-01-02 LAB — POCT INR: INR: 2.5

## 2016-01-16 ENCOUNTER — Ambulatory Visit (INDEPENDENT_AMBULATORY_CARE_PROVIDER_SITE_OTHER): Payer: BC Managed Care – PPO

## 2016-01-16 DIAGNOSIS — Z9889 Other specified postprocedural states: Secondary | ICD-10-CM | POA: Diagnosis not present

## 2016-01-16 DIAGNOSIS — I4891 Unspecified atrial fibrillation: Secondary | ICD-10-CM

## 2016-01-16 DIAGNOSIS — I482 Chronic atrial fibrillation, unspecified: Secondary | ICD-10-CM

## 2016-01-16 DIAGNOSIS — I059 Rheumatic mitral valve disease, unspecified: Secondary | ICD-10-CM

## 2016-01-16 DIAGNOSIS — Z5181 Encounter for therapeutic drug level monitoring: Secondary | ICD-10-CM | POA: Diagnosis not present

## 2016-01-16 LAB — POCT INR: INR: 4.1

## 2016-01-24 ENCOUNTER — Telehealth: Payer: Self-pay | Admitting: *Deleted

## 2016-01-24 NOTE — Telephone Encounter (Signed)
Will forward this note to the number provided and to the [facility] clinic.

## 2016-01-24 NOTE — Telephone Encounter (Signed)
Hold coumadin 5 days prior to procedure and resume day of; lovenox bridge. Kirk Ruths

## 2016-01-24 NOTE — Telephone Encounter (Signed)
Pt is needing clearance for colonoscopy. They are also needing directions regarding warfarin for the procedure. Will forward for dr Stanford Breed review

## 2016-01-30 ENCOUNTER — Ambulatory Visit (INDEPENDENT_AMBULATORY_CARE_PROVIDER_SITE_OTHER): Payer: BC Managed Care – PPO

## 2016-01-30 DIAGNOSIS — I482 Chronic atrial fibrillation, unspecified: Secondary | ICD-10-CM

## 2016-01-30 DIAGNOSIS — Z5181 Encounter for therapeutic drug level monitoring: Secondary | ICD-10-CM

## 2016-01-30 DIAGNOSIS — I4891 Unspecified atrial fibrillation: Secondary | ICD-10-CM

## 2016-01-30 DIAGNOSIS — I059 Rheumatic mitral valve disease, unspecified: Secondary | ICD-10-CM

## 2016-01-30 DIAGNOSIS — Z9889 Other specified postprocedural states: Secondary | ICD-10-CM

## 2016-01-30 LAB — POCT INR: INR: 4.8

## 2016-01-30 MED ORDER — ENOXAPARIN SODIUM 80 MG/0.8ML ~~LOC~~ SOLN: 80.0000 mg | Freq: Two times a day (BID) | SUBCUTANEOUS | 1 refills | Status: DC

## 2016-01-30 NOTE — Patient Instructions (Signed)
01/31/16: Last dose of Coumadin  02/01/16: No coumadin or Lovenox   02/02/16: Inject Lovenox 80mg  in the fatty abdominal tissue at least 2 inches from the belly button twice a day about 12 hours apart, 8am and 8pm rotate sites. No Coumadin  02/03/16: Inject Lovenox in the fatty tissue every 12 hours, 8am and 8pm. No Coumadin  02/04/16: Inject Lovenox in the fatty tissue every 12 hours, 8am and 8pm. No Coumadin  02/05/16: Inject Lovenox in the fatty tissue in the morning at 8 am (No pm dose). No Coumadin  02/06/16: Procedure Day - No Lovenox - Resume Coumadin in the evening or as directed by doctor (take an extra half tablet with usual dose for 2 days then resume normal dose) 1.5 tablets x 2 doses then resume 1 tablet daily.  02/07/16: Resume Lovenox inject in the fatty tissue every 12 hours and take coumadin   02/08/16: Inject Lovenox in the fatty tissue every 12 hours and take coumadin  02/09/16: Inject Lovenox in the fatty tissue every 12 hours and take coumadin  02/10/16: Inject Lovenox in the fatty tissue every 12 hours and take coumadin  02/11/16: Inject Lovenox in the fatty tissue every 12 hours and take coumadin  02/12/16: Coumadin appt to check INR

## 2016-02-12 ENCOUNTER — Ambulatory Visit (INDEPENDENT_AMBULATORY_CARE_PROVIDER_SITE_OTHER): Payer: BC Managed Care – PPO | Admitting: *Deleted

## 2016-02-12 DIAGNOSIS — I059 Rheumatic mitral valve disease, unspecified: Secondary | ICD-10-CM

## 2016-02-12 DIAGNOSIS — I482 Chronic atrial fibrillation, unspecified: Secondary | ICD-10-CM

## 2016-02-12 DIAGNOSIS — Z5181 Encounter for therapeutic drug level monitoring: Secondary | ICD-10-CM

## 2016-02-12 DIAGNOSIS — Z9889 Other specified postprocedural states: Secondary | ICD-10-CM | POA: Diagnosis not present

## 2016-02-12 DIAGNOSIS — I4891 Unspecified atrial fibrillation: Secondary | ICD-10-CM

## 2016-02-12 LAB — POCT INR: INR: 2.1

## 2016-02-13 ENCOUNTER — Other Ambulatory Visit: Payer: Self-pay | Admitting: Pharmacist

## 2016-02-13 MED ORDER — WARFARIN SODIUM 5 MG PO TABS: ORAL_TABLET | ORAL | 1 refills | Status: DC

## 2016-02-19 ENCOUNTER — Ambulatory Visit (INDEPENDENT_AMBULATORY_CARE_PROVIDER_SITE_OTHER): Payer: BC Managed Care – PPO

## 2016-02-19 DIAGNOSIS — I482 Chronic atrial fibrillation, unspecified: Secondary | ICD-10-CM

## 2016-02-19 DIAGNOSIS — Z9889 Other specified postprocedural states: Secondary | ICD-10-CM | POA: Diagnosis not present

## 2016-02-19 DIAGNOSIS — I4891 Unspecified atrial fibrillation: Secondary | ICD-10-CM | POA: Diagnosis not present

## 2016-02-19 DIAGNOSIS — Z5181 Encounter for therapeutic drug level monitoring: Secondary | ICD-10-CM | POA: Diagnosis not present

## 2016-02-19 DIAGNOSIS — I059 Rheumatic mitral valve disease, unspecified: Secondary | ICD-10-CM | POA: Diagnosis not present

## 2016-02-19 LAB — POCT INR: INR: 3

## 2016-02-20 ENCOUNTER — Other Ambulatory Visit: Payer: Self-pay | Admitting: General Surgery

## 2016-02-20 DIAGNOSIS — R920 Mammographic microcalcification found on diagnostic imaging of breast: Secondary | ICD-10-CM

## 2016-03-03 ENCOUNTER — Inpatient Hospital Stay
Admission: RE | Admit: 2016-03-03 | Discharge: 2016-03-03 | Disposition: A | Discharge status: Home / Self Care | Payer: Self-pay | Source: Ambulatory Visit | Attending: *Deleted | Admitting: *Deleted

## 2016-03-03 ENCOUNTER — Other Ambulatory Visit: Payer: Self-pay | Admitting: *Deleted

## 2016-03-03 DIAGNOSIS — Z9289 Personal history of other medical treatment: Secondary | ICD-10-CM

## 2016-03-05 ENCOUNTER — Encounter: Payer: Self-pay | Admitting: *Deleted

## 2016-03-05 ENCOUNTER — Ambulatory Visit (INDEPENDENT_AMBULATORY_CARE_PROVIDER_SITE_OTHER): Payer: BC Managed Care – PPO | Admitting: *Deleted

## 2016-03-05 DIAGNOSIS — I4891 Unspecified atrial fibrillation: Secondary | ICD-10-CM

## 2016-03-05 DIAGNOSIS — I059 Rheumatic mitral valve disease, unspecified: Secondary | ICD-10-CM | POA: Diagnosis not present

## 2016-03-05 DIAGNOSIS — Z9889 Other specified postprocedural states: Secondary | ICD-10-CM | POA: Diagnosis not present

## 2016-03-05 DIAGNOSIS — I482 Chronic atrial fibrillation, unspecified: Secondary | ICD-10-CM

## 2016-03-05 DIAGNOSIS — Z5181 Encounter for therapeutic drug level monitoring: Secondary | ICD-10-CM

## 2016-03-05 LAB — POCT INR: INR: 3.1

## 2016-03-13 ENCOUNTER — Encounter: Payer: Self-pay | Admitting: Family Medicine

## 2016-03-13 ENCOUNTER — Ambulatory Visit (INDEPENDENT_AMBULATORY_CARE_PROVIDER_SITE_OTHER): Payer: BC Managed Care – PPO | Admitting: Family Medicine

## 2016-03-13 VITALS — BP 138/92 | HR 84 | Temp 98.0°F | Resp 16 | Ht 64.0 in | Wt 189.0 lb

## 2016-03-13 DIAGNOSIS — R7301 Impaired fasting glucose: Secondary | ICD-10-CM

## 2016-03-13 DIAGNOSIS — B001 Herpesviral vesicular dermatitis: Secondary | ICD-10-CM

## 2016-03-13 DIAGNOSIS — R1011 Right upper quadrant pain: Secondary | ICD-10-CM | POA: Diagnosis not present

## 2016-03-13 DIAGNOSIS — Z8719 Personal history of other diseases of the digestive system: Secondary | ICD-10-CM | POA: Insufficient documentation

## 2016-03-13 DIAGNOSIS — I10 Essential (primary) hypertension: Secondary | ICD-10-CM

## 2016-03-13 LAB — AMYLASE: AMYLASE: 30 U/L (ref 0–105)

## 2016-03-13 LAB — COMPLETE METABOLIC PANEL WITH GFR
ALBUMIN: 3.7 g/dL (ref 3.6–5.1)
ALT: 22 U/L (ref 6–29)
AST: 26 U/L (ref 10–35)
Alkaline Phosphatase: 88 U/L (ref 33–130)
BUN: 8 mg/dL (ref 7–25)
CHLORIDE: 104 mmol/L (ref 98–110)
CO2: 25 mmol/L (ref 20–31)
Calcium: 8.7 mg/dL (ref 8.6–10.4)
Creat: 0.81 mg/dL (ref 0.50–0.99)
GFR, Est African American: >89 mL/min (ref >=60)
GFR, Est Non African American: 78 mL/min (ref >=60)
Glucose, Bld: 107 mg/dL — ABNORMAL HIGH (ref 65–99)
POTASSIUM: 4.4 mmol/L (ref 3.5–5.3)
SODIUM: 138 mmol/L (ref 135–146)
Total Bilirubin: 0.7 mg/dL (ref 0.2–1.2)
Total Protein: 6.7 g/dL (ref 6.1–8.1)

## 2016-03-13 LAB — LIPASE: LIPASE: 32 U/L (ref 7–60)

## 2016-03-13 LAB — POCT GLYCOSYLATED HEMOGLOBIN (HGB A1C): Hemoglobin A1C: 4.4

## 2016-03-13 MED ORDER — ACYCLOVIR 400 MG PO TABS: 400.0000 mg | ORAL_TABLET | Freq: Every day | ORAL | 1 refills | Status: DC

## 2016-03-13 MED ORDER — LISINOPRIL 20 MG PO TABS: 20.0000 mg | ORAL_TABLET | Freq: Every day | ORAL | 11 refills | Status: DC

## 2016-03-13 NOTE — Progress Notes (Signed)
Subjective:    Patient ID: [name], female    DOB: 1956/02/11, 60 y.o.   MRN: [id number]  HPI: [name] is a 60 y.o. female presenting on 03/13/2016 for Hypertension   HPI  Pt presents for hypertension follow-up. Avg at home 145/80. No chest pain. No SOB. No visual changes. Taking 10 of lisinopril daily.  Trouble with her stomach last night- colciky pain. Happens every few years. Told in the past it was likely a gallstone- she had gallbladder out in 2006. Was told she would produce stones every 4-5 years. ERCP in 2013- did show a stone in the bile duct. No fevers at home. Pain only present for a few minutes. No nausea. No diarrhea. No change in bowel movements. Pt is not established with a GI provider.  Pt has a cold sore- L upper lip that started yesterday. Painful. Has a history of cold sores.   Past Medical History:  Diagnosis Date  . Anticoagulated on warfarin   . Chronic atrial fibrillation ([place])    SINCE 1997  . GERD (gastroesophageal reflux disease)   . Goiter    multinodular  . History of hyperthyroidism    2003--  PTU [place]  . HTN (hypertension)    unspecified  . Hypercholesterolemia   . Hyperlipidemia   . Pulmonary hypertension, mild ([place])   . Rheumatic heart disease    CARDIOLOGIST-- DR Marcello Moores WALL  . S/P MVR (mitral valve replacement)    [facility]  [facility]--  SECONDARY TO SEVERE MV STENOSIS   Past Surgical History:  Procedure Laterality Date  . ANTERIOR CERVICAL DECOMP/DISCECTOMY FUSION  12-21-2001   C5 --  C6  . CARDIAC CATHETERIZATION  12-28-2006  [facility]   NORMAL CORONARY ARTERIES  . CARPAL TUNNEL RELEASE Right 01-20-2007  . COLONOSCOPY  2015  . ERCP  08/15/2011   Procedure: ENDOSCOPIC RETROGRADE CHOLANGIOPANCREATOGRAPHY (ERCP);  Surgeon: Jeryl Columbia, MD;  Location: [facility] ENDOSCOPY;  Service: Endoscopy;  Laterality: N/A;  . ERCP     MULTIPLE--  INCLUDING STENTS, SPHINTEROTOMY'S  STONE REMOVAL   . FEMUR IM NAIL Right 04/26/2014   Procedure:  INTRAMEDULLARY (IM) NAIL FEMORAL;  Surgeon: Alta Corning, MD;  Location: [place];  Service: Orthopedics;  Laterality: Right;  . I&D EXTREMITY Right 02/07/2013   Procedure: IRRIGATION AND DEBRIDEMENT OF RIGHT LOWER LEG WITH PLACEMENT OF ACELL AND WOULND VAC;  Surgeon: Theodoro Kos, DO;  Location: [place];  Service: Plastics;  Laterality: Right;  . LAPAROSCOPIC CHOLECYSTECTOMY  04-08-2002  . MITRAL VALVE REPLACEMENT  1997   [facility] [place]  . ORIF ANKLE FRACTURE Right 11/05/2012   Procedure: OPEN REDUCTION INTERNAL FIXATION (ORIF) ANKLE FRACTURE only operating on right;  Surgeon: Alta Corning, MD;  Location: [place];  Service: Orthopedics;  Laterality: Right;  . ORIF RIGHT ULNAR FX W/ ILIAC CREST BONE GRAFT  10-06-2008  . ORIF WRIST FRACTURE Right 04/26/2014   Procedure: OPEN REDUCTION INTERNAL FIXATION (ORIF) WRIST FRACTURE;  Surgeon: Alta Corning, MD;  Location: [place];  Service: Orthopedics;  Laterality: Right;  . TRANSTHORACIC ECHOCARDIOGRAM  06-29-2009  dr wall   normal lvsf/ ef 55-60%/  normal bileaflet mechanical mvr/ moderated dilated la/ moderate tr  . WRIST ARTHROSCOPY Right 12-10-2007   debridement and ulnar shortening osteotomy w/ plate     Current Outpatient Prescriptions on File Prior to Visit  Medication Sig  . atorvastatin (LIPITOR) 40 MG tablet Take 1 tablet (40 mg total) by mouth every evening.  Marland Kitchen  calcium carbonate (OSCAL) 1500 (600 Ca) MG TABS tablet Take by mouth daily.  . diphenhydrAMINE (BENADRYL) 25 MG tablet Take 1 tablet (25 mg total) by mouth every 6 (six) hours as needed.  . enoxaparin (LOVENOX) 80 MG/0.8ML injection Inject 0.8 mLs (80 mg total) into the skin every 12 (twelve) hours. As instructed by [facility]madin Clinic  . fluticasone (FLONASE) 50 MCG/ACT nasal spray Place 2 sprays into both nostrils daily.  Marland Kitchen gabapentin (NEURONTIN) 300 MG capsule Take 1 capsule by mouth. Use as directed  . HYDROcodone-acetaminophen (NORCO/VICODIN) 5-325 MG tablet Take 1 tablet  by mouth as needed. Reported on 12/04/2015  . loratadine (CLARITIN) 10 MG tablet Take 1 tablet (10 mg total) by mouth daily.  . metoprolol succinate (TOPROL-XL) 100 MG 24 hr tablet Take 1 tablet (100 mg total) by mouth daily. Take with or immediately following a meal.  . Multiple Vitamins-Minerals (CENTRUM SILVER ADULT 50+ PO) Take 1 tablet by mouth daily.  Marland Kitchen omeprazole (PRILOSEC) 20 MG capsule Take 20 mg by mouth every morning.   . warfarin (COUMADIN) 5 MG tablet Take 1 tablet by mouth daily or as directed by Coumadin clinic.   No current facility-administered medications on file prior to visit.     Review of Systems  Constitutional: Negative for chills and fever.  HENT: Negative.   Respiratory: Negative for cough, chest tightness and wheezing.   Cardiovascular: Negative for chest pain and leg swelling.  Gastrointestinal: Positive for abdominal pain. Negative for blood in stool, constipation, diarrhea, nausea, rectal pain and vomiting.  Endocrine: Negative.  Negative for cold intolerance, heat intolerance, polydipsia, polyphagia and polyuria.  Genitourinary: Negative for difficulty urinating and dysuria.  Musculoskeletal: Negative.   Neurological: Negative for dizziness, light-headedness and numbness.  Psychiatric/Behavioral: Negative.    Per HPI unless specifically indicated above     Objective:    BP (!) 138/92 (BP Location: Right Arm, Cuff Size: Large)   Pulse 84   Temp 98 F (36.7 C) (Oral)   Resp 16   Ht 5\' 4"  (1.626 m)   Wt 189 lb (85.7 kg)   BMI 32.44 kg/m   Wt Readings from Last 3 Encounters:  03/13/16 189 lb (85.7 kg)  12/04/15 196 lb (88.9 kg)  10/30/15 196 lb (88.9 kg)    Physical Exam  Constitutional: She is oriented to person, place, and time. She appears well-developed and well-nourished.  HENT:  Head: Normocephalic and atraumatic.  Vesicular lesions on the L upper lip.   Neck: Neck supple.  Cardiovascular: Normal rate, regular rhythm and normal heart  sounds.  Exam reveals no gallop and no friction rub.   No murmur heard. Click present 2/2 artificial valve.   Pulmonary/Chest: Effort normal and breath sounds normal. She has no wheezes. She has no rhonchi. She has no rales. She exhibits no tenderness.  Abdominal: Soft. Normal appearance and bowel sounds are normal. She exhibits no distension and no mass. There is no tenderness. There is no rebound and no guarding.  Musculoskeletal: Normal range of motion. She exhibits no edema or tenderness.  Lymphadenopathy:    She has no cervical adenopathy.  Neurological: She is alert and oriented to person, place, and time.  Skin: Skin is warm and dry.   Results for orders placed or performed in visit on 03/13/16  POCT HgB A1C  Result Value Ref Range   Hemoglobin A1C 4.4       Assessment & Plan:   Problem List Items Addressed This Visit  Cardiovascular and Mediastinum   Essential hypertension - Primary    Increase lisinopril to 20mg  once daily. Repeat BMP prior to next visit. Reviewed DASH diet. Recheck 4 weeks.       Relevant Medications   lisinopril (PRINIVIL,ZESTRIL) 20 MG tablet   Other Relevant Orders   COMPLETE METABOLIC PANEL WITH GFR     Other   RUQ abdominal pain    Pt has had multiple issues with stones in the past. ERCP in 2013. Will obtain US of the abdomen to r/o stone. Alarm symptoms reviewed. Plan to refer to GI.       Relevant Orders   US Abdomen Limited RUQ   Amylase   Lipase    Other Visit Diagnoses    Elevated fasting blood sugar       A1c of 4.4% is normal.    Relevant Orders   POCT HgB A1C (Completed)   Cold sore       Treat with acyclovir 5 times daily.    Relevant Medications   acyclovir (ZOVIRAX) 400 MG tablet      Meds ordered this encounter  Medications  . amitriptyline (ELAVIL) 50 MG tablet  . lisinopril (PRINIVIL,ZESTRIL) 20 MG tablet    Sig: Take 1 tablet (20 mg total) by mouth daily.    Dispense:  30 tablet    Refill:  11    Order  Specific Question:   Supervising Provider    Answer:   Arlis Porta [phone]  . acyclovir (ZOVIRAX) 400 MG tablet    Sig: Take 1 tablet (400 mg total) by mouth 5 (five) times daily.    Dispense:  25 tablet    Refill:  1    Order Specific Question:   Supervising Provider    Answer:   Arlis Porta [id number]      Follow up plan: Return in about 4 weeks (around 04/10/2016), or if symptoms worsen or fail to improve, for HTN. Marland Kitchen

## 2016-03-13 NOTE — Assessment & Plan Note (Signed)
Increase lisinopril to 20mg  once daily. Repeat BMP prior to next visit. Reviewed DASH diet. Recheck 4 weeks.

## 2016-03-13 NOTE — Patient Instructions (Addendum)
We will go up on your lisinopril today. Take 2 pills daily until your 10 mg bottle is empty and then pick up the 20 mg   Your goal blood pressure is 140/90 Work on low salt/sodium diet - goal <1.5gm (1,500mg ) per day. Eat a diet high in fruits/vegetables and whole grains.  Look into mediterranean and DASH diet. Goal activity is 123min/wk of moderate intensity exercise.  This can be split into 30 minute chunks.  If you are not at this level, you can start with smaller 10-15 min increments and slowly build up activity. Look at [place].org for more resources  GO to the ER: Severe abdominal. Severe nausea and vomiting, vomiting blood, blood in the stool, fever, or other concerning symptoms.

## 2016-03-13 NOTE — Assessment & Plan Note (Signed)
Pt has had multiple issues with stones in the past. ERCP in 2013. Will obtain US of the abdomen to r/o stone. Alarm symptoms reviewed. Plan to refer to GI.

## 2016-03-19 ENCOUNTER — Other Ambulatory Visit: Payer: Self-pay | Admitting: Family Medicine

## 2016-03-19 ENCOUNTER — Ambulatory Visit
Admission: RE | Admit: 2016-03-19 | Discharge: 2016-03-19 | Disposition: A | Discharge status: Home / Self Care | Payer: BC Managed Care – PPO | Source: Ambulatory Visit | Attending: Family Medicine | Admitting: Family Medicine

## 2016-03-19 DIAGNOSIS — R932 Abnormal findings on diagnostic imaging of liver and biliary tract: Secondary | ICD-10-CM | POA: Insufficient documentation

## 2016-03-19 DIAGNOSIS — R1011 Right upper quadrant pain: Secondary | ICD-10-CM

## 2016-03-19 DIAGNOSIS — K760 Fatty (change of) liver, not elsewhere classified: Secondary | ICD-10-CM

## 2016-03-19 DIAGNOSIS — Z9049 Acquired absence of other specified parts of digestive tract: Secondary | ICD-10-CM | POA: Diagnosis not present

## 2016-03-26 ENCOUNTER — Ambulatory Visit (INDEPENDENT_AMBULATORY_CARE_PROVIDER_SITE_OTHER): Payer: BC Managed Care – PPO

## 2016-03-26 DIAGNOSIS — Z5181 Encounter for therapeutic drug level monitoring: Secondary | ICD-10-CM | POA: Diagnosis not present

## 2016-03-26 DIAGNOSIS — I4891 Unspecified atrial fibrillation: Secondary | ICD-10-CM

## 2016-03-26 DIAGNOSIS — Z9889 Other specified postprocedural states: Secondary | ICD-10-CM

## 2016-03-26 DIAGNOSIS — I059 Rheumatic mitral valve disease, unspecified: Secondary | ICD-10-CM

## 2016-03-26 DIAGNOSIS — I482 Chronic atrial fibrillation, unspecified: Secondary | ICD-10-CM

## 2016-03-26 LAB — POCT INR: INR: 3.5

## 2016-04-10 ENCOUNTER — Ambulatory Visit (INDEPENDENT_AMBULATORY_CARE_PROVIDER_SITE_OTHER): Payer: BC Managed Care – PPO | Admitting: Family Medicine

## 2016-04-10 ENCOUNTER — Other Ambulatory Visit: Payer: Self-pay | Admitting: Family Medicine

## 2016-04-10 ENCOUNTER — Encounter: Payer: Self-pay | Admitting: Family Medicine

## 2016-04-10 VITALS — BP 130/84 | HR 76 | Temp 98.0°F | Resp 16 | Ht 64.0 in | Wt 194.0 lb

## 2016-04-10 DIAGNOSIS — I482 Chronic atrial fibrillation, unspecified: Secondary | ICD-10-CM

## 2016-04-10 DIAGNOSIS — Z23 Encounter for immunization: Secondary | ICD-10-CM

## 2016-04-10 DIAGNOSIS — I10 Essential (primary) hypertension: Secondary | ICD-10-CM

## 2016-04-10 DIAGNOSIS — Z205 Contact with and (suspected) exposure to viral hepatitis: Secondary | ICD-10-CM | POA: Diagnosis not present

## 2016-04-10 DIAGNOSIS — R1011 Right upper quadrant pain: Secondary | ICD-10-CM | POA: Diagnosis not present

## 2016-04-10 MED ORDER — ZOSTER VACCINE LIVE 19400 UNT/0.65ML ~~LOC~~ SUSR: 0.6500 mL | Freq: Once | SUBCUTANEOUS | 0 refills | Status: AC

## 2016-04-10 MED ORDER — ZOSTER VACCINE LIVE 19400 UNT/0.65ML ~~LOC~~ SUSR
0.6500 mL | Freq: Once | SUBCUTANEOUS | 0 refills | Status: DC
Start: 2016-04-10 — End: 2016-04-10

## 2016-04-10 NOTE — Assessment & Plan Note (Signed)
Controlled on recheck today. Continue current regimen.  Reviewed DASH diet. Recheck 3 mos.   R arm BP is > than L consistently per patient. Only at 55mmHg difference. Recommend to patient to check frequently in both arms. Call if >24mmgHg difference L vs. R.  At that point consider a vein and vascular consult.

## 2016-04-10 NOTE — Assessment & Plan Note (Signed)
Will follow with Dr. Allen Norris on 10/31. RUQ [place] was negative. To ER with severe abdominal pain, nausea, vomiting, fever, or other concerning symptoms.

## 2016-04-10 NOTE — Assessment & Plan Note (Signed)
Continue to follow with cardiology. Continue Toprol XL for rate control.

## 2016-04-10 NOTE — Patient Instructions (Addendum)
Severe abdominal pain, nausea, or vomiting- please go to the ER.    Your goal blood pressure is 140/90. Work on low salt/sodium diet - goal <1.5gm (1,500mg ) per day. Eat a diet high in fruits/vegetables and whole grains.  Look into mediterranean and DASH diet. Goal activity is 178min/wk of moderate intensity exercise.  This can be split into 30 minute chunks.  If you are not at this level, you can start with smaller 10-15 min increments and slowly build up activity. Look at [place].org for more resources  Please seek immediate medical attention at ER or Urgent Care if you develop: Chest pain, pressure or tightness. Shortness of breath accompanied by nausea or diaphoresis Visual changes Numbness or tingling on one side of the body Facial droop Altered mental status  Or any concerning symptoms.

## 2016-04-10 NOTE — Progress Notes (Signed)
Subjective:    Patient ID: [name], female    DOB: 08-May-1956, 60 y.o.   MRN: [id number]  HPI: [name] is a 60 y.o. female presenting on 04/10/2016 for Hypertension   HPI  Pt presents for blood pressure follow-up. Took her meds this morning at 7am right before leaving. Taking lisinopril 20mg  and Toprol 100 for blood pressure. Checking at home- avg 130/80.  No chest pain. No sorthenss of breath, no visual changes.   Goes tomorrow for repeat mammogram. Will follow-up with Dr. Bary Castilla for her abnormal mammogram.  Sees Dr. Allen Norris on Halloween for abdominal pain. RUQ [place] was negative.  She has had 2 more episodes of intense abdominal pain since her ultrasound. No nausea. No vomiting. No blood in stool. No fevers.  Recheck L arm: 130/84 and R arm 140/90    Past Medical History:  Diagnosis Date  . Anticoagulated on warfarin   . Chronic atrial fibrillation ([place])    SINCE 1997  . GERD (gastroesophageal reflux disease)   . Goiter    multinodular  . History of hyperthyroidism    2003--  PTU [place]  . HTN (hypertension)    unspecified  . Hypercholesterolemia   . Hyperlipidemia   . Pulmonary hypertension, mild   . Rheumatic heart disease    CARDIOLOGIST-- DR Marcello Moores WALL  . S/P MVR (mitral valve replacement)    1997  [facility]--  SECONDARY TO SEVERE MV STENOSIS    Current Outpatient Prescriptions on File Prior to Visit  Medication Sig  . acyclovir (ZOVIRAX) 400 MG tablet Take 1 tablet (400 mg total) by mouth 5 (five) times daily.  Marland Kitchen amitriptyline (ELAVIL) 50 MG tablet   . atorvastatin (LIPITOR) 40 MG tablet Take 1 tablet (40 mg total) by mouth every evening.  . calcium carbonate (OSCAL) 1500 (600 Ca) MG TABS tablet Take by mouth daily.  . diphenhydrAMINE (BENADRYL) 25 MG tablet Take 1 tablet (25 mg total) by mouth every 6 (six) hours as needed.  . enoxaparin (LOVENOX) 80 MG/0.8ML injection Inject 0.8 mLs (80 mg total) into the skin every 12 (twelve) hours. As instructed by  Coumadin Clinic  . fluticasone (FLONASE) 50 MCG/ACT nasal spray Place 2 sprays into both nostrils daily.  Marland Kitchen gabapentin (NEURONTIN) 300 MG capsule Take 1 capsule by mouth. Use as directed  . HYDROcodone-acetaminophen (NORCO/VICODIN) 5-325 MG tablet Take 1 tablet by mouth as needed. Reported on 12/04/2015  . lisinopril (PRINIVIL,ZESTRIL) 20 MG tablet Take 1 tablet (20 mg total) by mouth daily.  Marland Kitchen loratadine (CLARITIN) 10 MG tablet Take 1 tablet (10 mg total) by mouth daily.  . metoprolol succinate (TOPROL-XL) 100 MG 24 hr tablet Take 1 tablet (100 mg total) by mouth daily. Take with or immediately following a meal.  . Multiple Vitamins-Minerals (CENTRUM SILVER ADULT 50+ PO) Take 1 tablet by mouth daily.  Marland Kitchen omeprazole (PRILOSEC) 20 MG capsule Take 20 mg by mouth every morning.   . warfarin (COUMADIN) 5 MG tablet Take 1 tablet by mouth daily or as directed by Coumadin clinic.   No current facility-administered medications on file prior to visit.     Review of Systems  Constitutional: Negative for chills and fever.  HENT: Negative.   Respiratory: Negative for cough, chest tightness and wheezing.   Cardiovascular: Negative for chest pain and leg swelling.  Gastrointestinal: Positive for abdominal pain. Negative for constipation, diarrhea, nausea and vomiting.  Endocrine: Negative.  Negative for cold intolerance, heat intolerance, polydipsia, polyphagia and polyuria.  Genitourinary: Negative for difficulty urinating and dysuria.  Musculoskeletal: Negative.   Neurological: Negative for dizziness, light-headedness and numbness.  Psychiatric/Behavioral: Negative.    Per HPI unless specifically indicated above     Objective:    BP 130/84 (BP Location: Left Arm)   Pulse 76   Temp 98 F (36.7 C) (Oral)   Resp 16   Ht 5\' 4"  (1.626 m)   Wt 194 lb (88 kg)   BMI 33.30 kg/m   Wt Readings from Last 3 Encounters:  04/10/16 194 lb (88 kg)  03/13/16 189 lb (85.7 kg)  12/04/15 196 lb (88.9 kg)      Physical Exam  Constitutional: She is oriented to person, place, and time. She appears well-developed and well-nourished.  HENT:  Head: Normocephalic and atraumatic.  Neck: Neck supple.  Cardiovascular: Normal rate, regular rhythm and normal heart sounds.  Exam reveals no gallop and no friction rub.   No murmur heard. Click heard 2/2 artificial valve.   Pulmonary/Chest: Effort normal and breath sounds normal. She has no wheezes. She exhibits no tenderness.  Abdominal: Soft. Normal appearance and bowel sounds are normal. She exhibits no distension and no mass. There is no tenderness. There is no rebound and no guarding.  Musculoskeletal: Normal range of motion. She exhibits no edema or tenderness.  Lymphadenopathy:    She has no cervical adenopathy.  Neurological: She is alert and oriented to person, place, and time.  Skin: Skin is warm and dry.   Results for orders placed or performed in visit on 03/26/16  POCT INR  Result Value Ref Range   INR 3.5       Assessment & Plan:   Problem List Items Addressed This Visit      Cardiovascular and Mediastinum   Essential hypertension - Primary    Controlled on recheck today. Continue current regimen.  Reviewed DASH diet. Recheck 3 mos.   R arm BP is > than L consistently per patient. Only at 18mmHg difference. Recommend to patient to check frequently in both arms. Call if >58mmgHg difference L vs. R.  At that point consider a vein and vascular consult.       Chronic atrial fibrillation ([facility])    Continue to follow with cardiology. Continue Toprol XL for rate control.         Other   RUQ abdominal pain    Will follow with Dr. Allen Norris on 10/31. RUQ [place] was negative. To ER with severe abdominal pain, nausea, vomiting, fever, or other concerning symptoms.        Other Visit Diagnoses    Exposure to hepatitis C       Relevant Orders   Hepatitis C Ab Reflex HCV RNA, QUANT   Need for shingles vaccine       Relevant Medications   Zoster  Vaccine Live, PF, (ZOSTAVAX) 57846 UNT/0.65ML injection      Meds ordered this encounter  Medications  . DISCONTD: Zoster Vaccine Live, PF, (ZOSTAVAX) 96295 UNT/0.65ML injection    Sig: Inject 19,400 Units into the skin once.    Dispense:  1 each    Refill:  0    Order Specific Question:   Supervising Provider    Answer:   Arlis Porta [phone]  . Zoster Vaccine Live, PF, (ZOSTAVAX) 28413 UNT/0.65ML injection    Sig: Inject 19,400 Units into the skin once.    Dispense:  1 each    Refill:  0    Order Specific Question:  Supervising Provider    Answer:   Arlis Porta [id number]      Follow up plan: Return in about 3 months (around 07/11/2016), or if symptoms worsen or fail to improve, for Hypertension.

## 2016-04-11 ENCOUNTER — Ambulatory Visit
Admission: RE | Admit: 2016-04-11 | Discharge: 2016-04-11 | Disposition: A | Discharge status: Home / Self Care | Payer: BC Managed Care – PPO | Source: Ambulatory Visit | Attending: General Surgery | Admitting: General Surgery

## 2016-04-11 ENCOUNTER — Other Ambulatory Visit: Payer: Self-pay | Admitting: General Surgery

## 2016-04-11 DIAGNOSIS — R920 Mammographic microcalcification found on diagnostic imaging of breast: Secondary | ICD-10-CM

## 2016-04-11 LAB — HEPATITIS C ANTIBODY: HCV Ab: NEGATIVE

## 2016-04-21 ENCOUNTER — Encounter: Payer: Self-pay | Admitting: General Surgery

## 2016-04-21 ENCOUNTER — Ambulatory Visit (INDEPENDENT_AMBULATORY_CARE_PROVIDER_SITE_OTHER): Payer: BC Managed Care – PPO | Admitting: General Surgery

## 2016-04-21 VITALS — BP 128/74 | HR 76 | Resp 12 | Ht 63.0 in | Wt 197.0 lb

## 2016-04-21 DIAGNOSIS — R920 Mammographic microcalcification found on diagnostic imaging of breast: Secondary | ICD-10-CM | POA: Diagnosis not present

## 2016-04-21 NOTE — Progress Notes (Signed)
Patient ID: [name], female   DOB: 1956/03/26, 60 y.o.   MRN: [id number]  Chief Complaint  Patient presents with  . Follow-up    mammogram    HPI [name] is a 61 y.o. female who presents for a breast evaluation. The most recent mammogram was done on 04/11/16 .  Patient does perform regular self breast checks and gets regular mammograms done. Patient aunt [name] is present .    HPI  Past Medical History:  Diagnosis Date  . Anticoagulated on warfarin   . Chronic atrial fibrillation ([place])    SINCE 1997  . GERD (gastroesophageal reflux disease)   . Goiter    multinodular  . History of hyperthyroidism    2003--  PTU [place]  . HTN (hypertension)    unspecified  . Hypercholesterolemia   . Hyperlipidemia   . Pulmonary hypertension, mild   . Rheumatic heart disease    CARDIOLOGIST-- DR Marcello Moores WALL  . S/P MVR (mitral valve replacement)    1997  [facility]--  SECONDARY TO SEVERE MV STENOSIS    Past Surgical History:  Procedure Laterality Date  . ANTERIOR CERVICAL DECOMP/DISCECTOMY FUSION  12-21-2001   C5 --  C6  . CARDIAC CATHETERIZATION  12-28-2006  DR [facility]   NORMAL CORONARY ARTERIES  . CARPAL TUNNEL RELEASE Right 01-20-2007  . COLONOSCOPY  2015  . ERCP  08/15/2011   Procedure: ENDOSCOPIC RETROGRADE CHOLANGIOPANCREATOGRAPHY (ERCP);  Surgeon: Jeryl Columbia, MD;  Location: [facility] ENDOSCOPY;  Service: Endoscopy;  Laterality: N/A;  . ERCP     MULTIPLE--  INCLUDING STENTS, SPHINTEROTOMY'S  STONE REMOVAL   . FEMUR IM NAIL Right 04/26/2014   Procedure: INTRAMEDULLARY (IM) NAIL FEMORAL;  Surgeon: Alta Corning, MD;  Location: [place];  Service: Orthopedics;  Laterality: Right;  . I&D EXTREMITY Right 02/07/2013   Procedure: IRRIGATION AND DEBRIDEMENT OF RIGHT LOWER LEG WITH PLACEMENT OF ACELL AND WOULND VAC;  Surgeon: Theodoro Kos, DO;  Location: [place];  Service: Plastics;  Laterality: Right;  . LAPAROSCOPIC CHOLECYSTECTOMY  04-08-2002  . MITRAL VALVE  REPLACEMENT  1997   [facility] [place]  . ORIF ANKLE FRACTURE Right 11/05/2012   Procedure: OPEN REDUCTION INTERNAL FIXATION (ORIF) ANKLE FRACTURE only operating on right;  Surgeon: Alta Corning, MD;  Location: [place];  Service: Orthopedics;  Laterality: Right;  . ORIF RIGHT ULNAR FX W/ ILIAC CREST BONE GRAFT  10-06-2008  . ORIF WRIST FRACTURE Right 04/26/2014   Procedure: OPEN REDUCTION INTERNAL FIXATION (ORIF) WRIST FRACTURE;  Surgeon: Alta Corning, MD;  Location: [place];  Service: Orthopedics;  Laterality: Right;  . TRANSTHORACIC ECHOCARDIOGRAM  06-29-2009  dr wall   normal lvsf/ ef 55-60%/  normal bileaflet mechanical mvr/ moderated dilated la/ moderate tr  . WRIST ARTHROSCOPY Right 12-10-2007   debridement and ulnar shortening osteotomy w/ plate    Family History  Problem Relation Age of Onset  . Hypothyroidism Sister   . Gallbladder disease Sister   . Coronary artery disease    . Diabetes Mother   . Hypertension Mother   . Heart disease Father   . Stroke Father   . Breast cancer Neg Hx     Social History Social History  Substance Use Topics  . Smoking status: Never Smoker  . Smokeless tobacco: Never Used  . Alcohol use No    No Known Allergies  Current Outpatient Prescriptions  Medication Sig Dispense Refill  . atorvastatin (LIPITOR) 40 MG tablet Take 1 tablet (40  mg total) by mouth every evening. 90 tablet 3  . calcium carbonate (OSCAL) 1500 (600 Ca) MG TABS tablet Take by mouth daily.    . fluticasone (FLONASE) 50 MCG/ACT nasal spray Place 2 sprays into both nostrils daily. 16 g 11  . gabapentin (NEURONTIN) 300 MG capsule Take 1 capsule by mouth. Use as directed    . lisinopril (PRINIVIL,ZESTRIL) 20 MG tablet Take 1 tablet (20 mg total) by mouth daily. 30 tablet 11  . loratadine (CLARITIN) 10 MG tablet Take 1 tablet (10 mg total) by mouth daily. 30 tablet 11  . metoprolol succinate (TOPROL-XL) 100 MG 24 hr tablet Take 1 tablet (100 mg total) by mouth daily. Take with or  immediately following a meal. 90 tablet 3  . Multiple Vitamins-Minerals (CENTRUM SILVER ADULT 50+ PO) Take 1 tablet by mouth daily.    Marland Kitchen omeprazole (PRILOSEC) 20 MG capsule Take 20 mg by mouth every morning.     . warfarin (COUMADIN) 5 MG tablet Take 1 tablet by mouth daily or as directed by Coumadin clinic. 95 tablet 1  . amitriptyline (ELAVIL) 50 MG tablet      No current facility-administered medications for this visit.     Review of Systems Review of Systems  Respiratory: Negative.   Cardiovascular: Negative.     Blood pressure 128/74, pulse 76, resp. rate 12, height 5\' 3"  (1.6 m), weight 197 lb (89.4 kg).  Physical Exam Physical Exam  Constitutional: She is oriented to person, place, and time. She appears well-developed and well-nourished.  Eyes: Conjunctivae are normal. No scleral icterus.  Neck: Neck supple.  Cardiovascular: Normal rate, regular rhythm and normal heart sounds.   Pulmonary/Chest: Effort normal and breath sounds normal. Right breast exhibits no inverted nipple, no mass, no nipple discharge, no skin change and no tenderness. Left breast exhibits no inverted nipple, no mass, no nipple discharge, no skin change and no tenderness.  Abdominal: Soft. Bowel sounds are normal. There is no tenderness.  Lymphadenopathy:    She has no cervical adenopathy.    She has no axillary adenopathy.  Neurological: She is alert and oriented to person, place, and time.  Skin: Skin is warm and dry.    Data Reviewed 03/26/2016  POCT INR   Dx:  Atrial fibrillation ([place]); MITRAL VAL...   3wk ago  INR 3.5        Left breast diagnostic mammogram dated 04/11/2016 was reviewed. Radiologist reports 1 mm increase in the focal microcalcifications in the lateral aspect of the breast. BI-RADS-4.  Assessment    Minimal change in breast microcalcifications.    Plan    Options for management were reviewed: 1) wire localization with formal excision due to the need to maintain ongoing  anticoagulation with prosthetic heart valve versus 2) six-month follow-up to assess for more dramatic change in the focal area of microcalcifications which at worse likely represents a T0, DCIS.  Reviewing the options, I recommendation would be for observation.  The patient reported that the radiologist told her that the procedure could be completed that day. I see him he was unaware of her present anticoagulation status.    The patient has been asked to return to the office in six months with a bilateral  diagnostic mammogram.  This information has been scribed by Gaspar Cola CMA.   Robert Bellow 04/22/2016, 12:40 PM

## 2016-04-22 ENCOUNTER — Ambulatory Visit (INDEPENDENT_AMBULATORY_CARE_PROVIDER_SITE_OTHER): Payer: BC Managed Care – PPO | Admitting: Gastroenterology

## 2016-04-22 ENCOUNTER — Encounter: Payer: Self-pay | Admitting: Gastroenterology

## 2016-04-22 VITALS — BP 166/87 | HR 98 | Temp 98.2°F | Ht 63.0 in | Wt 196.4 lb

## 2016-04-22 DIAGNOSIS — M791 Myalgia: Secondary | ICD-10-CM

## 2016-04-22 DIAGNOSIS — K589 Irritable bowel syndrome without diarrhea: Secondary | ICD-10-CM

## 2016-04-22 DIAGNOSIS — M7918 Myalgia, other site: Secondary | ICD-10-CM

## 2016-04-22 MED ORDER — DICYCLOMINE HCL 10 MG PO CAPS: 10.0000 mg | ORAL_CAPSULE | Freq: Three times a day (TID) | ORAL | 3 refills | Status: DC

## 2016-04-22 NOTE — Progress Notes (Addendum)
Gastroenterology Consultation  Referring Provider:     Luciana Axe, NP Primary Care Physician:  Nobie Putnam, DO Primary Gastroenterologist:  Dr. Allen Norris     Reason for Consultation:     Right-sided abdominal pain        HPI:   [name] is a 60 y.o. y/o female referred for consultation & management of Right sided abdominal pain with fatty liver by Dr. Nobie Putnam, DO.  This patient comes in today with a report of right upper quadrant pain. The patient also states that she has to run to the bathroom shortly after eating. This usually happens 15 minutes after eating. The patient states that her stool consistencies are normal when she does have bowel movements. The patient also reports that she had her gallbladder taken out and subsequently had an ERCP. The patient has also had colonoscopies in [place] in the past. The patient states that her brother had colon polyps and she needs a colonoscopy every 5 years. There is no report of any unexplained weight loss. The patient also reports that her abdominal pain is worse when she coughs and sneezes. There is no report of any black stools or bloody stools.  Past Medical History:  Diagnosis Date  . Anticoagulated on warfarin   . Chronic atrial fibrillation ([place])    SINCE 1997  . GERD (gastroesophageal reflux disease)   . Goiter    multinodular  . History of hyperthyroidism    2003--  PTU [place]  . HTN (hypertension)    unspecified  . Hypercholesterolemia   . Hyperlipidemia   . Pulmonary hypertension, mild   . Rheumatic heart disease    CARDIOLOGIST-- DR Marcello Moores WALL  . S/P MVR (mitral valve replacement)    1997  [facility]--  SECONDARY TO SEVERE MV STENOSIS    Past Surgical History:  Procedure Laterality Date  . ANTERIOR CERVICAL DECOMP/DISCECTOMY FUSION  12-21-2001   C5 --  C6  . CARDIAC CATHETERIZATION  12-28-2006  DR [facility]   NORMAL CORONARY ARTERIES  . CARPAL TUNNEL RELEASE Right 01-20-2007  .  COLONOSCOPY  2015  . ERCP  08/15/2011   Procedure: ENDOSCOPIC RETROGRADE CHOLANGIOPANCREATOGRAPHY (ERCP);  Surgeon: Jeryl Columbia, MD;  Location: [facility] ENDOSCOPY;  Service: Endoscopy;  Laterality: N/A;  . ERCP     MULTIPLE--  INCLUDING STENTS, SPHINTEROTOMY'S  STONE REMOVAL   . FEMUR IM NAIL Right 04/26/2014   Procedure: INTRAMEDULLARY (IM) NAIL FEMORAL;  Surgeon: Alta Corning, MD;  Location: [place];  Service: Orthopedics;  Laterality: Right;  . I&D EXTREMITY Right 02/07/2013   Procedure: IRRIGATION AND DEBRIDEMENT OF RIGHT LOWER LEG WITH PLACEMENT OF ACELL AND WOULND VAC;  Surgeon: Theodoro Kos, DO;  Location: [place];  Service: Plastics;  Laterality: Right;  . LAPAROSCOPIC CHOLECYSTECTOMY  04-08-2002  . MITRAL VALVE REPLACEMENT  1997   [facility]  . ORIF ANKLE FRACTURE Right 11/05/2012   Procedure: OPEN REDUCTION INTERNAL FIXATION (ORIF) ANKLE FRACTURE only operating on right;  Surgeon: Alta Corning, MD;  Location: [place];  Service: Orthopedics;  Laterality: Right;  . ORIF RIGHT ULNAR FX W/ ILIAC CREST BONE GRAFT  10-06-2008  . ORIF WRIST FRACTURE Right 04/26/2014   Procedure: OPEN REDUCTION INTERNAL FIXATION (ORIF) WRIST FRACTURE;  Surgeon: Alta Corning, MD;  Location: [place];  Service: Orthopedics;  Laterality: Right;  . TRANSTHORACIC ECHOCARDIOGRAM  06-29-2009  dr wall   normal lvsf/ ef 55-60%/  normal bileaflet mechanical mvr/ moderated dilated la/  moderate tr  . WRIST ARTHROSCOPY Right 12-10-2007   debridement and ulnar shortening osteotomy w/ plate    Prior to Admission medications   Medication Sig Start Date End Date Taking? Authorizing Provider  amitriptyline (ELAVIL) 50 MG tablet  01/03/16  Yes Historical Provider, MD  atorvastatin (LIPITOR) 40 MG tablet Take 1 tablet (40 mg total) by mouth every evening. 08/01/15  Yes Lelon Perla, MD  calcium carbonate (OSCAL) 1500 (600 Ca) MG TABS tablet Take by mouth daily.   Yes Historical Provider, MD  fluticasone (FLONASE) 50  MCG/ACT nasal spray Place 2 sprays into both nostrils daily. 10/02/15  Yes Amy Overton Mam, NP  gabapentin (NEURONTIN) 300 MG capsule Take 1 capsule by mouth. Use as directed 05/31/15  Yes Historical Provider, MD  lisinopril (PRINIVIL,ZESTRIL) 20 MG tablet Take 1 tablet (20 mg total) by mouth daily. 03/13/16  Yes Amy Overton Mam, NP  loratadine (CLARITIN) 10 MG tablet Take 1 tablet (10 mg total) by mouth daily. 10/30/15  Yes Amy Overton Mam, NP  metoprolol succinate (TOPROL-XL) 100 MG 24 hr tablet Take 1 tablet (100 mg total) by mouth daily. Take with or immediately following a meal. 08/01/15  Yes Lelon Perla, MD  Multiple Vitamins-Minerals (CENTRUM SILVER ADULT 50+ PO) Take 1 tablet by mouth daily.   Yes Historical Provider, MD  warfarin (COUMADIN) 5 MG tablet Take 1 tablet by mouth daily or as directed by Coumadin clinic. 02/13/16  Yes Lelon Perla, MD  dicyclomine (BENTYL) 10 MG capsule Take 1 capsule (10 mg total) by mouth 4 (four) times daily -  before meals and at bedtime. 04/22/16   Lucilla Lame, MD  omeprazole (PRILOSEC) 20 MG capsule Take 20 mg by mouth every morning.     Historical Provider, MD    Family History  Problem Relation Age of Onset  . Hypothyroidism Sister   . Gallbladder disease Sister   . Coronary artery disease    . Diabetes Mother   . Hypertension Mother   . Heart disease Father   . Stroke Father   . Breast cancer Neg Hx      Social History  Substance Use Topics  . Smoking status: Never Smoker  . Smokeless tobacco: Never Used  . Alcohol use No    Allergies as of 04/22/2016  . (No Known Allergies)    Review of Systems:    All systems reviewed and negative except where noted in HPI.   Physical Exam:  BP (!) 166/87   Pulse 98   Temp 98.2 F (36.8 C) (Oral)   Ht 5\' 3"  (1.6 m)   Wt 196 lb 6.4 oz (89.1 kg)   BMI 34.79 kg/m  No LMP recorded. Patient is postmenopausal. Psych:  Alert and cooperative. Normal mood and affect. General:   Alert,   Well-developed, well-nourished, pleasant and cooperative in NAD Head:  Normocephalic and atraumatic. Eyes:  Sclera clear, no icterus.   Conjunctiva pink. Ears:  Normal auditory acuity. Nose:  No deformity, discharge, or lesions. Mouth:  No deformity or lesions,oropharynx pink & moist. Neck:  Supple; no masses or thyromegaly. Lungs:  Respirations even and unlabored.  Clear throughout to auscultation.   No wheezes, crackles, or rhonchi. No acute distress. Heart:  Irregular rate and rhythm with mechanical heart sounds from her prosthetic valve. Abdomen:  Normal bowel sounds.  No bruits.  Soft, Positive tenderness in the right upper quadrant with 1 finger palpation while flexing the abdominal wall muscles. and non-distended without masses, hepatosplenomegaly  or hernias noted.  No guarding or rebound tenderness.  Positive Carnett sign.   Rectal:  Deferred.  Msk:  Symmetrical without gross deformities.  Good, equal movement & strength bilaterally. Pulses:  Normal pulses noted. Extremities:  No clubbing or edema.  No cyanosis. Neurologic:  Alert and oriented x3;  grossly normal neurologically. Skin:  Intact without significant lesions or rashes.  No jaundice. Lymph Nodes:  No significant cervical adenopathy. Psych:  Alert and cooperative. Normal mood and affect.  Imaging Studies: Mm Diag Breast Tomo Uni Left  Result Date: 04/11/2016 CLINICAL DATA:  Patient for short-term follow-up left breast calcifications. On prior diagnostic evaluation 10/09/2015, biopsy was recommended. The biopsy has not been performed as of current date. EXAM: 2D DIGITAL DIAGNOSTIC UNILATERAL LEFT MAMMOGRAM WITH CAD AND ADJUNCT TOMO COMPARISON:  Previous exam(s). ACR Breast Density Category c: The breast tissue is heterogeneously dense, which may obscure small masses. FINDINGS: Magnification CC and true lateral views of the left breast demonstrate an increasing 5 mm group of pleomorphic calcifications within the upper-outer  left breast. No additional suspicious abnormalities are identified within the left breast. Mammographic images were processed with CAD. IMPRESSION: Increasing pleomorphic calcifications upper-outer left breast. RECOMMENDATION: Stereotactic guided core needle biopsy increasing suspicious pleomorphic left breast calcifications. I have discussed the findings and recommendations with the patient. Results were also provided in writing at the conclusion of the visit. If applicable, a reminder letter will be sent to the patient regarding the next appointment. BI-RADS CATEGORY  4: Suspicious. Electronically Signed   By: Lovey Newcomer M.D.   On: 04/11/2016 10:33    Assessment and Plan:   [name] is a 60 y.o. y/o female comes in today with a report of right upper quadrant pain that from physical exam is reproducible by straight leg raise and flexing the abdominal wall muscles. The patient then reports the pain to be exacerbated with 1 finger palpation on the rectus abdominis muscles in the right upper quadrant. The patient does have fatty liver on imaging but her liver enzymes are normal. The patient has been told that fatty liver is caused by being overweight, diabetes and hypercholesterolemia most commonly. The patient has been told to try to reduce her weight. No further workup is needed for her fatty liver with normal liver enzymes. The patient has also been explained that the muscle pain should be treated with warm compresses and massage as to that area. As far as her postprandial defecation she was start a trial of low-dose dicyclomine for irritable bowel syndrome. The patient has been explained the plan and agrees with it.   Note: This dictation was prepared with Dragon dictation along with smaller phrase technology. Any transcriptional errors that result from this process are unintentional.

## 2016-04-22 NOTE — Patient Instructions (Signed)
You have been given a prescription for Dicyclomine 10mg  today which has been sent to your pharmacy. Please contact our office with any questions or concerns.

## 2016-04-23 ENCOUNTER — Ambulatory Visit (INDEPENDENT_AMBULATORY_CARE_PROVIDER_SITE_OTHER): Payer: BC Managed Care – PPO

## 2016-04-23 DIAGNOSIS — I482 Chronic atrial fibrillation, unspecified: Secondary | ICD-10-CM

## 2016-04-23 DIAGNOSIS — Z5181 Encounter for therapeutic drug level monitoring: Secondary | ICD-10-CM | POA: Diagnosis not present

## 2016-04-23 DIAGNOSIS — I4891 Unspecified atrial fibrillation: Secondary | ICD-10-CM | POA: Diagnosis not present

## 2016-04-23 DIAGNOSIS — Z9889 Other specified postprocedural states: Secondary | ICD-10-CM | POA: Diagnosis not present

## 2016-04-23 DIAGNOSIS — I059 Rheumatic mitral valve disease, unspecified: Secondary | ICD-10-CM

## 2016-04-23 LAB — POCT INR: INR: 2.5

## 2016-05-21 ENCOUNTER — Ambulatory Visit (INDEPENDENT_AMBULATORY_CARE_PROVIDER_SITE_OTHER): Payer: BC Managed Care – PPO

## 2016-05-21 ENCOUNTER — Other Ambulatory Visit: Payer: Self-pay | Admitting: Pharmacist Clinician (PhC)/ Clinical Pharmacy Specialist

## 2016-05-21 DIAGNOSIS — I482 Chronic atrial fibrillation, unspecified: Secondary | ICD-10-CM

## 2016-05-21 DIAGNOSIS — I059 Rheumatic mitral valve disease, unspecified: Secondary | ICD-10-CM

## 2016-05-21 DIAGNOSIS — I4891 Unspecified atrial fibrillation: Secondary | ICD-10-CM | POA: Diagnosis not present

## 2016-05-21 DIAGNOSIS — Z9889 Other specified postprocedural states: Secondary | ICD-10-CM

## 2016-05-21 DIAGNOSIS — Z5181 Encounter for therapeutic drug level monitoring: Secondary | ICD-10-CM | POA: Diagnosis not present

## 2016-05-21 LAB — POCT INR: INR: 3.2

## 2016-05-21 MED ORDER — WARFARIN SODIUM 5 MG PO TABS: ORAL_TABLET | ORAL | 1 refills | Status: DC

## 2016-05-22 ENCOUNTER — Other Ambulatory Visit: Payer: Self-pay

## 2016-05-22 MED ORDER — METOPROLOL SUCCINATE ER 100 MG PO TB24: 100.0000 mg | ORAL_TABLET | Freq: Every day | ORAL | 2 refills | Status: DC

## 2016-06-03 ENCOUNTER — Encounter: Payer: Self-pay | Admitting: Family Medicine

## 2016-06-03 ENCOUNTER — Ambulatory Visit (INDEPENDENT_AMBULATORY_CARE_PROVIDER_SITE_OTHER): Payer: Commercial Managed Care - HMO | Admitting: Family Medicine

## 2016-06-03 ENCOUNTER — Other Ambulatory Visit: Payer: Self-pay | Admitting: Family Medicine

## 2016-06-03 VITALS — BP 138/84 | HR 79 | Temp 97.7°F | Resp 16 | Ht 63.0 in | Wt 195.0 lb

## 2016-06-03 DIAGNOSIS — I059 Rheumatic mitral valve disease, unspecified: Secondary | ICD-10-CM

## 2016-06-03 DIAGNOSIS — Z5181 Encounter for therapeutic drug level monitoring: Secondary | ICD-10-CM

## 2016-06-03 DIAGNOSIS — E559 Vitamin D deficiency, unspecified: Secondary | ICD-10-CM

## 2016-06-03 DIAGNOSIS — Z124 Encounter for screening for malignant neoplasm of cervix: Secondary | ICD-10-CM | POA: Diagnosis not present

## 2016-06-03 DIAGNOSIS — Z8639 Personal history of other endocrine, nutritional and metabolic disease: Secondary | ICD-10-CM | POA: Diagnosis not present

## 2016-06-03 DIAGNOSIS — Z7901 Long term (current) use of anticoagulants: Secondary | ICD-10-CM

## 2016-06-03 DIAGNOSIS — E7800 Pure hypercholesterolemia, unspecified: Secondary | ICD-10-CM | POA: Diagnosis not present

## 2016-06-03 DIAGNOSIS — I10 Essential (primary) hypertension: Secondary | ICD-10-CM | POA: Diagnosis not present

## 2016-06-03 DIAGNOSIS — Z0000 Encounter for general adult medical examination without abnormal findings: Secondary | ICD-10-CM

## 2016-06-03 DIAGNOSIS — M810 Age-related osteoporosis without current pathological fracture: Secondary | ICD-10-CM | POA: Diagnosis not present

## 2016-06-03 DIAGNOSIS — M8580 Other specified disorders of bone density and structure, unspecified site: Secondary | ICD-10-CM

## 2016-06-03 DIAGNOSIS — Z114 Encounter for screening for human immunodeficiency virus [HIV]: Secondary | ICD-10-CM

## 2016-06-03 DIAGNOSIS — J3089 Other allergic rhinitis: Secondary | ICD-10-CM

## 2016-06-03 DIAGNOSIS — E042 Nontoxic multinodular goiter: Secondary | ICD-10-CM | POA: Diagnosis not present

## 2016-06-03 DIAGNOSIS — I482 Chronic atrial fibrillation, unspecified: Secondary | ICD-10-CM

## 2016-06-03 DIAGNOSIS — Z952 Presence of prosthetic heart valve: Secondary | ICD-10-CM | POA: Diagnosis not present

## 2016-06-03 MED ORDER — ALENDRONATE SODIUM 35 MG PO TABS: 35.0000 mg | ORAL_TABLET | ORAL | 3 refills | Status: DC

## 2016-06-03 NOTE — Assessment & Plan Note (Signed)
Stable, not on therapy x 3 past 3 years. H/o multi nodular goiter. Followed by Dr Renato Shin Endocrinology yearly

## 2016-06-03 NOTE — Assessment & Plan Note (Signed)
Controlled on Loratadine, Flonase

## 2016-06-03 NOTE — Assessment & Plan Note (Signed)
Stable, followed by [facility] Cardiology, on chronic warfarin anticoagulation for mechanical MVR (1997) with INR Goal 3.0 to 3.5

## 2016-06-03 NOTE — Assessment & Plan Note (Addendum)
Moderate initial elevated electronic BP reading >160/100, thought due to patient anxiety about new provider and pap smear today. Improved on manual re-check < 140/90  Plan: 1. No change to anti-HTN therapy today, continue Lisinopril 20 and Metoprolol-XL 100mg  daily 2. Monitor BP at home 3. Encouraged regular walking exercise 4. Follow-up as scheduled with Cardiology soon, no new labs needed for BP today 5. Monitor q 6-12 months

## 2016-06-03 NOTE — Progress Notes (Signed)
Subjective:    Patient ID: [name], female    DOB: 12/15/1955, 60 y.o.   MRN: [id number]  [name] is a 60 y.o. female presenting on 06/03/2016 for Annual Exam   HPI   Chronic Anticoagulation followed by Cardiology - Followed by Cardiology [facility]) Dr Stanford Breed, she has past history of diagnosed with Rheumatic fever and mitral valve problem around age 3, she is s/p Mitral Valve replacement with St Jude mechanical valve (1997), has been on chronic anticoagulation with Warfarin since 1997, has doing well but does have some fluctuating INR values, goal INR 3.0 to 3.5 - Sees Cardiology yearly, next apt in December 2017, awaiting further blood work  CHRONIC HTN / Pulmonary HTN (Mild) Reports doing well. Checks BP at home with good results, reported avg 130s/80s. Admits anxious today about pap smear, with some initial elevated BP Current Meds - Lisinopril 20mg , Metoprolol-XL 100mg  daily   Reports good compliance, took meds today. Tolerating well, w/o complaints Denies CP, dyspnea, HA, edema, dizziness / lightheadedness  HYPERLIPIDEMIA: - Reports no concerns. Last lipid panel 06/05/15, well controlled usually checked by Cardiology she is due for this in 1-2 weeks for follow-up at their office - Currently taking atorvastatin 40mg  daily, tolerating well without side effects or myalgias - Lifestyle: Limited exercise (prior history of ankle fractures in 2014 and femur fracture in 2 places in 2016) does some walking (limited by some R leg swelling)  Osteopenia: - Diagnosis of Osteopenia by DEXA screening in 09/2015, with recent history over past 1-2 years of bilateral ankle fracture and R femur fracture x 2 locations required surgical fixation. - She is treated with Calcium supplement, has not had Vitamin D testing and not on vitamin D supplement - Never on bisphosphonate therapy  Hyperthyroidism / H/o Multinodular Goiter - Followed by Endocrin[facility], Dr Fletcher Anon),  treated initially with medication but has been off now for about 3 years, just yearly follow-up  Chronic Seasonal and Environmental Allergies / Alleric Rhinosinusitis - Reports doing well currently, has some congestion but improved. Taking Loratadine and Flonase, does not need refills today.  Health Maintenance: - Due for pap smear, unsure of last pap smear. Denies any prior abnormal results - Last Colonoscopy this year 09/2015 [facility]) did not have any polyps on exam, but due to brothers having colon polyps was advised to return in 5 years, she will switch to Dr Allen Norris - Due for routine HIV screen today - DEXA 09/2015 with dx Osteopenia, has had fractures with ankles and R femur x 2 places over past 1-2 years - Last Mammo 04/11/16, with Left abnormality, see result below, next follow-up imaging due in 08/2016   Past Medical History:  Diagnosis Date  . Anticoagulated on warfarin   . Chronic atrial fibrillation ([place])    SINCE 1997  . GERD (gastroesophageal reflux disease)   . Goiter    multinodular  . History of hyperthyroidism    2003--  [place]U [place]  . HTN (hypertension)    unspecified  . Hypercholesterolemia   . Hyperlipidemia   . Pulmonary hypertension, mild   . Rheumatic heart disease    CARDIOLOGIST-- DR Marcello Moores [facility]  . S/P MVR (mitral valve replacement)    1997  [facility]--  SECONDARY TO SEVERE MV STENOSIS   Social History   Social History  . Marital status: Divorced    Spouse name: N/A  . Number of children: N/A  . Years of education: N/A   Occupational History  .  Control and instrumentation engineer    Social History Main Topics  . Smoking status: Never Smoker  . Smokeless tobacco: Never Used  . Alcohol use No  . Drug use: No  . Sexual activity: Not on file   Other Topics Concern  . Not on file   Social History Narrative  . No narrative on file   Family History  Problem Relation Age of Onset  . Hypothyroidism Sister   . Gallbladder disease Sister   . Coronary artery disease      . Diabetes Mother   . Hypertension Mother   . Heart disease Father   . Stroke Father   . Breast cancer Neg Hx    Current Outpatient Prescriptions on File Prior to Visit  Medication Sig  . amitriptyline (ELAVIL) 50 MG tablet   . atorvastatin (LIPITOR) 40 MG tablet Take 1 tablet (40 mg total) by mouth every evening.  . calcium carbonate (OSCAL) 1500 (600 Ca) MG TABS tablet Take by mouth daily.  Marland Kitchen dicyclomine (BENTYL) 10 MG capsule Take 1 capsule (10 mg total) by mouth 4 (four) times daily -  before meals and at bedtime.  . fluticasone (FLONASE) 50 MCG/ACT nasal spray Place 2 sprays into both nostrils daily.  Marland Kitchen gabapentin (NEURONTIN) 300 MG capsule Take 1 capsule by mouth. Use as directed  . lisinopril (PRINIVIL,ZESTRIL) 20 MG tablet Take 1 tablet (20 mg total) by mouth daily.  Marland Kitchen loratadine (CLARITIN) 10 MG tablet Take 1 tablet (10 mg total) by mouth daily.  . metoprolol succinate (TOPROL-XL) 100 MG 24 hr tablet Take 1 tablet (100 mg total) by mouth daily. Take with or immediately following a meal.  . Multiple Vitamins-Minerals (CENTRUM SILVER ADULT 50+ PO) Take 1 tablet by mouth daily.  Marland Kitchen omeprazole (PRILOSEC) 20 MG capsule Take 20 mg by mouth every morning.   . warfarin (COUMADIN) 5 MG tablet Take 1 tablet by mouth daily or as directed by Coumadin clinic.   No current facility-administered medications on file prior to visit.     Review of Systems  Constitutional: Negative for activity change, appetite change, chills, diaphoresis, fatigue, fever and unexpected weight change.  HENT: Negative for congestion, hearing loss and sinus pressure.   Eyes: Negative for visual disturbance.  Respiratory: Negative for cough, chest tightness, shortness of breath and wheezing.   Cardiovascular: Negative for chest pain, palpitations and leg swelling.  Gastrointestinal: Negative for abdominal pain, anal bleeding, blood in stool, constipation, diarrhea, nausea and vomiting.  Endocrine: Negative for cold  intolerance, polydipsia and polyuria.  Genitourinary: Negative for dysuria, frequency and hematuria.  Musculoskeletal: Negative for arthralgias, back pain, gait problem and neck pain.  Skin: Negative for rash.  Allergic/Immunologic: Positive for environmental allergies.  Neurological: Negative for dizziness, weakness, light-headedness, numbness and headaches.  Hematological: Negative for adenopathy. Bruises/bleeds easily (on coumadin).  Psychiatric/Behavioral: Negative for behavioral problems, dysphoric mood and sleep disturbance. The patient is not nervous/anxious.    Per HPI unless specifically indicated above     Objective:    BP 138/84 (BP Location: Left Arm, Cuff Size: Normal)   Pulse 79   Temp 97.7 F (36.5 C) (Oral)   Resp 16   Ht 5\' 3"  (1.6 m)   Wt 195 lb (88.5 kg)   BMI 34.54 kg/m   Wt Readings from Last 3 Encounters:  06/03/16 195 lb (88.5 kg)  04/22/16 196 lb 6.4 oz (89.1 kg)  04/21/16 197 lb (89.4 kg)    Physical Exam  Constitutional: She is oriented to person,  place, and time. She appears well-developed and well-nourished. No distress.  Well-appearing, comfortable, cooperative  HENT:  Head: Normocephalic and atraumatic.  Mouth/Throat: Oropharynx is clear and moist.  Eyes: Conjunctivae and EOM are normal. Pupils are equal, round, and reactive to light.  Neck: Normal range of motion. Neck supple. No thyromegaly present.  Cardiovascular: Regular rhythm and intact distal pulses.   No murmur heard. Irregularly irregular rhythm. Mechanical heart valve clicking.  Pulmonary/Chest: Effort normal and breath sounds normal. No respiratory distress. She has no wheezes. She has no rales.  Abdominal: Soft. Bowel sounds are normal. She exhibits no distension and no mass. There is no tenderness.  Genitourinary:  Genitourinary Comments: Normal external female genitalia. Vaginal canal with some normal appearing tissue atrophy, without lesions. Normal appearing cervix seems mildly  friable without lesions, mild bleeding on pap smear specimen collection. Physiologic discharge on exam. Bimanual exam without masses or cervical motion tenderness.  Pelvic Exam chaperoned by Frederich Cha, CMA  Musculoskeletal: Normal range of motion. She exhibits no edema or tenderness.  Lymphadenopathy:    She has no cervical adenopathy.  Neurological: She is alert and oriented to person, place, and time.  Skin: Skin is warm and dry. No rash noted. She is not diaphoretic.  Psychiatric: She has a normal mood and affect. Her behavior is normal.  Nursing note and vitals reviewed.    I have personally reviewed the radiology report from Mammogram (diagnostic Left) 04/11/16  CLINICAL DATA:  Patient for short-term follow-up left breast calcifications. On prior diagnostic evaluation 10/09/2015, biopsy was recommended. The biopsy has not been performed as of current date.  EXAM: 2D DIGITAL DIAGNOSTIC UNILATERAL LEFT MAMMOGRAM WITH CAD AND ADJUNCT TOMO  COMPARISON:  Previous exam(s).  ACR Breast Density Category c: The breast tissue is heterogeneously dense, which may obscure small masses.  FINDINGS: Magnification CC and true lateral views of the left breast demonstrate an increasing 5 mm group of pleomorphic calcifications within the upper-outer left breast. No additional suspicious abnormalities are identified within the left breast.  Mammographic images were processed with CAD.  IMPRESSION: Increasing pleomorphic calcifications upper-outer left breast.  RECOMMENDATION: Stereotactic guided core needle biopsy increasing suspicious pleomorphic left breast calcifications.  I have discussed the findings and recommendations with the patient. Results were also provided in writing at the conclusion of the visit. If applicable, a reminder letter will be sent to the patient regarding the next appointment.  BI-RADS CATEGORY  4: Suspicious.   Electronically Signed   By:  Lovey Newcomer M.D.   On: 04/11/2016 10:33  I have personally reviewed the following lab results    Chemistry      Component Value Date/Time   NA 138 03/13/2016 0846   K 4.4 03/13/2016 0846   CL 104 03/13/2016 0846   CO2 25 03/13/2016 0846   BUN 8 03/13/2016 0846   CREATININE 0.81 03/13/2016 0846      Component Value Date/Time   CALCIUM 8.7 03/13/2016 0846   ALKPHOS 88 03/13/2016 0846   AST 26 03/13/2016 0846   ALT 22 03/13/2016 0846   BILITOT 0.7 03/13/2016 0846     Lipid Panel     Component Value Date/Time   CHOL 130 06/05/2015 0926   TRIG 98 06/05/2015 0926   HDL 47 06/05/2015 0926   CHOLHDL 2.8 06/05/2015 0926   VLDL 20 06/05/2015 0926   LDLCALC 63 06/05/2015 0926   LDLDIRECT 192.5 11/04/2010 1131     Results for orders placed or performed in visit on  05/21/16  POCT INR  Result Value Ref Range   INR 3.2       Assessment & Plan:   Problem List Items Addressed This Visit    S/P MVR (mitral valve replacement)    Stable, followed by [facility]iology, on chronic warfarin anticoagulation      Osteopenia determined by x-ray    Osteopenia by DEXA 09/2015, with concern of prior history of fractures ankles and R femur x 2 locations over past 1-2 years, consider patient high risk for falls / fracture - On calcium supplement  Plan: 1. Check Vitamin D today - follow-up therapeutic dose, and advised future maintenance dosing Vitamin D3 2,000iu daily 2. Start Alendronate bisphosphonate 35mg  weekly (osteoporosis prophylaxis dose), trial for 1-3 years, reviewed side effects and risks 3. Follow-up DEXA after 09/2016, follow-up 6 months      Relevant Medications   alendronate (FOSAMAX) 35 MG tablet   Mitral valve disease    Stable, followed by [facility] Cardiology, on chronic warfarin anticoagulation      HYPERCHOLESTEROLEMIA    Controlled on atorvastatin 40mg  daily, last lipids 06/05/15 Followed by Cardiology, will defer lipids today since not full calendar year, and she is  due for future fasting labs per Cardiology in next 1-2 weeks anyways      History of hyperthyroidism    Stable, not on therapy x 3 past 3 years. H/o multi nodular goiter. Followed by Dr Renato Shin Endocrinology yearly      GOITER, MULTINODULAR    Followed by Dr Renato Shin Endocrinology H/o multi nodular goiter (has been off hyperthyroid therapy since 2014)      Essential hypertension    Moderate initial elevated electronic BP reading >160/100, thought due to patient anxiety about new provider and pap smear today. Improved on manual re-check < 140/90  Plan: 1. No change to anti-HTN therapy today, continue Lisinopril 20 and Metoprolol-XL 100mg  daily 2. Monitor BP at home 3. Encouraged regular walking exercise 4. Follow-up as scheduled with Cardiology soon, no new labs needed for BP today 5. Monitor q 6-12 months      Environmental and seasonal allergies    Controlled on Loratadine, Flonase      Chronic atrial fibrillation (HCC)    Stable, chronic AFib on chronic anticoagulation and rate control. Continue follow-up with [facility] Cardiology      Anticoagulated on Coumadin    Stable, followed by [facility] Cardiology, on chronic warfarin anticoagulation for mechanical MVR (1997) with INR Goal 3.0 to 3.5       Other Visit Diagnoses    Annual physical exam    -  Primary   Cervical cancer screening       Check Pap smear today w/ HPV co testing. Unsure last pap, no prior abnormal. If double negative today, may be last one, up to patient, >65 at that point.   Relevant Orders   PAP, Thin Prep w/HPV rflx HPV Type 16/18   Screening for HIV (human immunodeficiency virus)       Relevant Orders   HIV antibody      Meds ordered this encounter  Medications  . alendronate (FOSAMAX) 35 MG tablet    Sig: Take 1 tablet (35 mg total) by mouth every 7 (seven) days. Take with a full glass of water on an empty stomach.    Dispense:  12 tablet    Refill:  3      Follow up plan: Return in  about 6 months (around 12/02/2016) for osteopenia, Vitamin  Kellie Simmering, DO [facility] 06/03/2016, 9:57 AM

## 2016-06-03 NOTE — Assessment & Plan Note (Signed)
Followed by Dr Renato Shin Endocrinology H/o multi nodular goiter (has been off hyperthyroid therapy since 2014)

## 2016-06-03 NOTE — Assessment & Plan Note (Signed)
Controlled on atorvastatin 40mg  daily, last lipids 06/05/15 Followed by Cardiology, will defer lipids today since not full calendar year, and she is due for future fasting labs per Cardiology in next 1-2 weeks anyways

## 2016-06-03 NOTE — Assessment & Plan Note (Signed)
Stable, followed by [facility] Cardiology, on chronic warfarin anticoagulation

## 2016-06-03 NOTE — Assessment & Plan Note (Signed)
Stable, followed by [facility] Cardiology, on chronic warfarin anticoagulation

## 2016-06-03 NOTE — Patient Instructions (Signed)
Thank you for coming in to clinic today.  1. Overall it sounds like you are doing well. 2. BP is improved after re-check today. Keep track of this and follow-up with Cardiologist 3. You have osteopenia (not quite osteoporosis) - we will check Vitamin D level today, if low I will contact you with treatment dose of Vitamin D, and then when finished with that after 12 weeks start Vitamin D3 2,000 unit once a day (over the counter), continue Calcium supplement - Start new medication Alendronate 35mg  once a week with full glass of water, do not lay down after taking this, it can cause severe throat irritation, take this for up to 1 year, and max duration for now would be 3 years, this is half dose or preventative dose - Check DEXA scan again 09/2016  Pap smear today, will send results to your MyChart in few days if negative then good for 3-5 years, and most likely do not need to repeat  Follow-up with Cardiology for coumadin and INR, and make sure that they check CHOLESTEROL labs this month  Keep trying to do regular walking, this helps keep bones strong too  Please schedule a follow-up appointment with Dr. Parks Ranger in 6 months for follow-up Osteopenia / Vitamin D  If you have any other questions or concerns, please feel free to call the clinic or send a message through [place]. You may also schedule an earlier appointment if necessary.  Nobie Putnam, DO [place]

## 2016-06-03 NOTE — Assessment & Plan Note (Signed)
Stable, chronic AFib on chronic anticoagulation and rate control. Continue follow-up with [facility] Cardiology

## 2016-06-03 NOTE — Assessment & Plan Note (Signed)
Osteopenia by DEXA 09/2015, with concern of prior history of fractures ankles and R femur x 2 locations over past 1-2 years, consider patient high risk for falls / fracture - On calcium supplement  Plan: 1. Check Vitamin D today - follow-up therapeutic dose, and advised future maintenance dosing Vitamin D3 2,000iu daily 2. Start Alendronate bisphosphonate 35mg  weekly (osteoporosis prophylaxis dose), trial for 1-3 years, reviewed side effects and risks 3. Follow-up DEXA after 09/2016, follow-up 6 months

## 2016-06-04 LAB — VITAMIN D 25 HYDROXY (VIT D DEFICIENCY, FRACTURES): VIT D 25 HYDROXY: 12 ng/mL — AB (ref 30–100)

## 2016-06-04 LAB — HIV ANTIBODY (ROUTINE TESTING W REFLEX): HIV: NONREACTIVE

## 2016-06-04 MED ORDER — VITAMIN D3 1.25 MG (50000 UT) PO CAPS: 50000.0000 [IU] | ORAL_CAPSULE | ORAL | 0 refills | Status: DC

## 2016-06-04 NOTE — Addendum Note (Signed)
Addended by: Olin Hauser on: 06/04/2016 08:24 AM   Modules accepted: Orders

## 2016-06-05 LAB — PAP, THIN PREP W/HPV RFLX HPV TYPE 16/18: HPV DNA High Risk: NOT DETECTED

## 2016-06-23 HISTORY — PX: BREAST EXCISIONAL BIOPSY: SUR124

## 2016-06-25 ENCOUNTER — Ambulatory Visit (INDEPENDENT_AMBULATORY_CARE_PROVIDER_SITE_OTHER): Payer: Medicare Other | Admitting: *Deleted

## 2016-06-25 DIAGNOSIS — Z5181 Encounter for therapeutic drug level monitoring: Secondary | ICD-10-CM

## 2016-06-25 DIAGNOSIS — Z9889 Other specified postprocedural states: Secondary | ICD-10-CM | POA: Diagnosis not present

## 2016-06-25 DIAGNOSIS — I482 Chronic atrial fibrillation, unspecified: Secondary | ICD-10-CM

## 2016-06-25 DIAGNOSIS — I059 Rheumatic mitral valve disease, unspecified: Secondary | ICD-10-CM

## 2016-06-25 DIAGNOSIS — I4891 Unspecified atrial fibrillation: Secondary | ICD-10-CM | POA: Diagnosis not present

## 2016-06-25 LAB — POCT INR: INR: 4.4

## 2016-07-08 ENCOUNTER — Ambulatory Visit (INDEPENDENT_AMBULATORY_CARE_PROVIDER_SITE_OTHER): Payer: Medicare Other | Admitting: *Deleted

## 2016-07-08 DIAGNOSIS — Z9889 Other specified postprocedural states: Secondary | ICD-10-CM

## 2016-07-08 DIAGNOSIS — Z5181 Encounter for therapeutic drug level monitoring: Secondary | ICD-10-CM | POA: Diagnosis not present

## 2016-07-08 DIAGNOSIS — I059 Rheumatic mitral valve disease, unspecified: Secondary | ICD-10-CM

## 2016-07-08 DIAGNOSIS — I482 Chronic atrial fibrillation, unspecified: Secondary | ICD-10-CM

## 2016-07-08 DIAGNOSIS — I4891 Unspecified atrial fibrillation: Secondary | ICD-10-CM

## 2016-07-08 LAB — POCT INR: INR: 3.7

## 2016-07-09 NOTE — Progress Notes (Signed)
HPI: FU atrial fibrillation and previous mitral valve replacement for rheumatic heart disease. Patient had mitral valve replacement in 1997. Cardiac catheterization in 2008 showed normal coronary arteries. ABIs with Doppler in July of 2014 normal. Last echocardiogram December 2014 showed normal LV function, trace aortic insufficiency, normally functioning prosthetic mitral valve, severe left atrial enlargement. Holter monitor January 2015 showed heart rate was elevated and beta blocker was increased. Since last seen, patient denies dyspnea, chest pain, palpitations or syncope. No bleeding.  Current Outpatient Prescriptions  Medication Sig Dispense Refill  . alendronate (FOSAMAX) 35 MG tablet Take 1 tablet (35 mg total) by mouth every 7 (seven) days. Take with a full glass of water on an empty stomach. 12 tablet 3  . amitriptyline (ELAVIL) 50 MG tablet Take 50 mg by mouth daily.     Marland Kitchen atorvastatin (LIPITOR) 40 MG tablet Take 1 tablet (40 mg total) by mouth every evening. 90 tablet 3  . calcium carbonate (OSCAL) 1500 (600 Ca) MG TABS tablet Take 1 tablet by mouth daily.     . Cholecalciferol (VITAMIN D3) 50000 units CAPS Take 50,000 Units by mouth once a week. For 12 weeks, then switch to OTC Vitamin D3 2,000 unit daily 12 capsule 0  . dicyclomine (BENTYL) 10 MG capsule Take 1 capsule (10 mg total) by mouth 4 (four) times daily -  before meals and at bedtime. 90 capsule 3  . fluticasone (FLONASE) 50 MCG/ACT nasal spray Place 2 sprays into both nostrils daily. 16 g 11  . gabapentin (NEURONTIN) 300 MG capsule Take 1 capsule by mouth daily as needed (nerve pain).     Marland Kitchen lisinopril (PRINIVIL,ZESTRIL) 20 MG tablet Take 1 tablet (20 mg total) by mouth daily. 30 tablet 11  . loratadine (CLARITIN) 10 MG tablet Take 1 tablet (10 mg total) by mouth daily. 30 tablet 11  . metoprolol succinate (TOPROL-XL) 100 MG 24 hr tablet Take 1 tablet (100 mg total) by mouth daily. Take with or immediately following a  meal. 90 tablet 2  . Multiple Vitamins-Minerals (CENTRUM SILVER ADULT 50+ PO) Take 1 tablet by mouth daily.    Marland Kitchen omeprazole (PRILOSEC) 20 MG capsule Take 20 mg by mouth every morning.     . warfarin (COUMADIN) 5 MG tablet Take 1 tablet by mouth daily or as directed by Coumadin clinic. 95 tablet 1   No current facility-administered medications for this visit.      Past Medical History:  Diagnosis Date  . Anticoagulated on warfarin   . Chronic atrial fibrillation ([place])    SINCE 1997  . GERD (gastroesophageal reflux disease)   . Goiter    multinodular  . History of hyperthyroidism    2003--  [place]  . HTN (hypertension)    unspecified  . Hypercholesterolemia   . Hyperlipidemia   . Pulmonary hypertension, mild   . Rheumatic heart disease    CARDIOLOGIST-- DR Marcello Moores [facility]  . S/P MVR (mitral valve replacement)    1997  [facility]--  SECONDARY TO SEVERE MV STENOSIS    Past Surgical History:  Procedure Laterality Date  . ANTERIOR CERVICAL DECOMP/DISCECTOMY FUSION  12-21-2001   C5 --  C6  . CARDIAC CATHETERIZATION  12-28-2006  DR [facility]   NORMAL CORONARY ARTERIES  . CARPAL TUNNEL RELEASE Right 01-20-2007  . COLONOSCOPY  2015  . ERCP  08/15/2011   Procedure: ENDOSCOPIC RETROGRADE CHOLANGIOPANCREATOGRAPHY (ERCP);  Surgeon: Jeryl Columbia, MD;  Location: [facility] ENDOSCOPY;  Service: Endoscopy;  Laterality: N/A;  . ERCP     MULTIPLE--  INCLUDING STENTS, SPHINTEROTOMY'S  STONE REMOVAL   . FEMUR IM NAIL Right 04/26/2014   Procedure: INTRAMEDULLARY (IM) NAIL FEMORAL;  Surgeon: Alta Corning, MD;  Location: [place];  Service: Orthopedics;  Laterality: Right;  . I&D EXTREMITY Right 02/07/2013   Procedure: IRRIGATION AND DEBRIDEMENT OF RIGHT LOWER LEG WITH PLACEMENT OF ACELL AND WOULND VAC;  Surgeon: Theodoro Kos, DO;  Location: [place];  Service: Plastics;  Laterality: Right;  . LAPAROSCOPIC CHOLECYSTECTOMY  04-08-2002  . MITRAL VALVE REPLACEMENT  1997   [facility]  . ORIF ANKLE  FRACTURE Right 11/05/2012   Procedure: OPEN REDUCTION INTERNAL FIXATION (ORIF) ANKLE FRACTURE only operating on right;  Surgeon: Alta Corning, MD;  Location: [place];  Service: Orthopedics;  Laterality: Right;  . ORIF RIGHT ULNAR FX W/ ILIAC CREST BONE GRAFT  10-06-2008  . ORIF WRIST FRACTURE Right 04/26/2014   Procedure: OPEN REDUCTION INTERNAL FIXATION (ORIF) WRIST FRACTURE;  Surgeon: Alta Corning, MD;  Location: [place];  Service: Orthopedics;  Laterality: Right;  . TRANSTHORACIC ECHOCARDIOGRAM  06-29-2009  dr wall   normal lvsf/ ef 55-60%/  normal bileaflet mechanical mvr/ moderated dilated la/ moderate tr  . WRIST ARTHROSCOPY Right 12-10-2007   debridement and ulnar shortening osteotomy w/ plate    Social History   Social History  . Marital status: Divorced    Spouse name: N/A  . Number of children: N/A  . Years of education: N/A   Occupational History  . Control and instrumentation engineer    Social History Main Topics  . Smoking status: Never Smoker  . Smokeless tobacco: Never Used  . Alcohol use No  . Drug use: No  . Sexual activity: Not on file   Other Topics Concern  . Not on file   Social History Narrative  . No narrative on file    Family History  Problem Relation Age of Onset  . Hypothyroidism Sister   . Gallbladder disease Sister   . Coronary artery disease    . Diabetes Mother   . Hypertension Mother   . Heart disease Father   . Stroke Father   . Breast cancer Neg Hx     ROS: no fevers or chills, productive cough, hemoptysis, dysphasia, odynophagia, melena, hematochezia, dysuria, hematuria, rash, seizure activity, orthopnea, PND, pedal edema, claudication. Remaining systems are negative.  Physical Exam: Well-developed well-nourished in no acute distress.  Skin is warm and dry.  HEENT is normal.  Neck is supple.  Chest is clear to auscultation with normal expansion.  Cardiovascular exam is irregular, crisp mechanical valve sound  Abdominal exam nontender or distended.  No masses palpated. Extremities show no edema. neuro grossly intact  ECG-Atrial fibrillation at a rate of 90. Nonspecific ST changes.  A/P  1 Permanent atrial fibrillation-continue beta blocker for rate control. Continue Coumadin. Check CBC.  2 hypertension-blood pressure elevated. Continue present medications and increase lisinopril to 40 mg daily. Check potassium and renal function in one week.  3 hyperlipidemia-continue statin. Check lipids and liver.  4 status post mitral valve replacement-continue Coumadin. Add aspirin 81 mg daily. Continue SBE prophylaxis.  Kirk Ruths, MD

## 2016-07-17 ENCOUNTER — Encounter: Payer: Self-pay | Admitting: Cardiology

## 2016-07-17 ENCOUNTER — Ambulatory Visit (INDEPENDENT_AMBULATORY_CARE_PROVIDER_SITE_OTHER): Payer: Medicare Other | Admitting: Cardiology

## 2016-07-17 VITALS — BP 166/99 | HR 90 | Ht 64.0 in | Wt 194.0 lb

## 2016-07-17 DIAGNOSIS — I10 Essential (primary) hypertension: Secondary | ICD-10-CM

## 2016-07-17 DIAGNOSIS — I4891 Unspecified atrial fibrillation: Secondary | ICD-10-CM | POA: Diagnosis not present

## 2016-07-17 DIAGNOSIS — E7800 Pure hypercholesterolemia, unspecified: Secondary | ICD-10-CM

## 2016-07-17 MED ORDER — ASPIRIN EC 81 MG PO TBEC: 81.0000 mg | DELAYED_RELEASE_TABLET | Freq: Every day | ORAL | 3 refills | Status: AC

## 2016-07-17 MED ORDER — METOPROLOL SUCCINATE ER 100 MG PO TB24: 100.0000 mg | ORAL_TABLET | Freq: Every day | ORAL | 3 refills | Status: DC

## 2016-07-17 MED ORDER — LISINOPRIL 40 MG PO TABS: 40.0000 mg | ORAL_TABLET | Freq: Every day | ORAL | 3 refills | Status: DC

## 2016-07-17 NOTE — Patient Instructions (Signed)
Medication Instructions:   INCREASE LISINOPRIL TO 40 MG ONCE DAILY= 2 OF THE 20 MG TABLETS ONCE DAILY  START ASPIRIN 81 MG ONCE DAILY  Labwork:  Your physician recommends that you return for lab work WITH NEXT PROTIME CHECK IN [place]  Follow-Up:  Your physician wants you to follow-up in: [place] will receive a reminder letter in the mail two months in advance. If you don't receive a letter, please call our office to schedule the follow-up appointment.   If you need a refill on your cardiac medications before your next appointment, please call your pharmacy.

## 2016-07-23 ENCOUNTER — Ambulatory Visit (INDEPENDENT_AMBULATORY_CARE_PROVIDER_SITE_OTHER): Payer: Medicare Other

## 2016-07-23 ENCOUNTER — Other Ambulatory Visit: Payer: Medicare Other

## 2016-07-23 DIAGNOSIS — I4891 Unspecified atrial fibrillation: Secondary | ICD-10-CM

## 2016-07-23 DIAGNOSIS — Z9889 Other specified postprocedural states: Secondary | ICD-10-CM | POA: Diagnosis not present

## 2016-07-23 DIAGNOSIS — I059 Rheumatic mitral valve disease, unspecified: Secondary | ICD-10-CM | POA: Diagnosis not present

## 2016-07-23 DIAGNOSIS — Z5181 Encounter for therapeutic drug level monitoring: Secondary | ICD-10-CM | POA: Diagnosis not present

## 2016-07-23 DIAGNOSIS — I482 Chronic atrial fibrillation, unspecified: Secondary | ICD-10-CM

## 2016-07-23 DIAGNOSIS — E7800 Pure hypercholesterolemia, unspecified: Secondary | ICD-10-CM

## 2016-07-23 LAB — POCT INR: INR: 4.2

## 2016-07-24 LAB — BASIC METABOLIC PANEL
BUN / CREAT RATIO: 13 (ref 12–28)
BUN: 12 mg/dL (ref 8–27)
CHLORIDE: 103 mmol/L (ref 96–106)
CO2: 21 mmol/L (ref 18–29)
CREATININE: 0.89 mg/dL (ref 0.57–1.00)
Calcium: 8.8 mg/dL (ref 8.7–10.3)
GFR calc Af Amer: 80 mL/min/{1.73_m2} (ref >59)
GFR calc non Af Amer: 70 mL/min/{1.73_m2} (ref >59)
GLUCOSE: 122 mg/dL — AB (ref 65–99)
Potassium: 5 mmol/L (ref 3.5–5.2)
Sodium: 142 mmol/L (ref 134–144)

## 2016-07-30 ENCOUNTER — Other Ambulatory Visit: Payer: Self-pay

## 2016-07-30 MED ORDER — ATORVASTATIN CALCIUM 40 MG PO TABS: 40.0000 mg | ORAL_TABLET | Freq: Every evening | ORAL | 3 refills | Status: DC

## 2016-08-06 ENCOUNTER — Ambulatory Visit (INDEPENDENT_AMBULATORY_CARE_PROVIDER_SITE_OTHER): Payer: Medicare Other

## 2016-08-06 DIAGNOSIS — Z5181 Encounter for therapeutic drug level monitoring: Secondary | ICD-10-CM | POA: Diagnosis not present

## 2016-08-06 DIAGNOSIS — I059 Rheumatic mitral valve disease, unspecified: Secondary | ICD-10-CM | POA: Diagnosis not present

## 2016-08-06 DIAGNOSIS — I482 Chronic atrial fibrillation, unspecified: Secondary | ICD-10-CM

## 2016-08-06 DIAGNOSIS — Z9889 Other specified postprocedural states: Secondary | ICD-10-CM

## 2016-08-06 DIAGNOSIS — I4891 Unspecified atrial fibrillation: Secondary | ICD-10-CM

## 2016-08-06 LAB — POCT INR: INR: 1.8

## 2016-08-07 ENCOUNTER — Other Ambulatory Visit: Payer: Self-pay

## 2016-08-07 DIAGNOSIS — R920 Mammographic microcalcification found on diagnostic imaging of breast: Secondary | ICD-10-CM

## 2016-08-20 ENCOUNTER — Ambulatory Visit (INDEPENDENT_AMBULATORY_CARE_PROVIDER_SITE_OTHER): Payer: Medicare Other

## 2016-08-20 DIAGNOSIS — I059 Rheumatic mitral valve disease, unspecified: Secondary | ICD-10-CM

## 2016-08-20 DIAGNOSIS — Z5181 Encounter for therapeutic drug level monitoring: Secondary | ICD-10-CM | POA: Diagnosis not present

## 2016-08-20 DIAGNOSIS — Z9889 Other specified postprocedural states: Secondary | ICD-10-CM

## 2016-08-20 DIAGNOSIS — I482 Chronic atrial fibrillation, unspecified: Secondary | ICD-10-CM

## 2016-08-20 DIAGNOSIS — I4891 Unspecified atrial fibrillation: Secondary | ICD-10-CM

## 2016-08-20 LAB — POCT INR: INR: 3.6

## 2016-09-10 ENCOUNTER — Ambulatory Visit (INDEPENDENT_AMBULATORY_CARE_PROVIDER_SITE_OTHER): Payer: Medicare Other

## 2016-09-10 DIAGNOSIS — Z5181 Encounter for therapeutic drug level monitoring: Secondary | ICD-10-CM

## 2016-09-10 DIAGNOSIS — I482 Chronic atrial fibrillation, unspecified: Secondary | ICD-10-CM

## 2016-09-10 DIAGNOSIS — Z9889 Other specified postprocedural states: Secondary | ICD-10-CM

## 2016-09-10 DIAGNOSIS — I4891 Unspecified atrial fibrillation: Secondary | ICD-10-CM

## 2016-09-10 DIAGNOSIS — I059 Rheumatic mitral valve disease, unspecified: Secondary | ICD-10-CM

## 2016-09-10 LAB — POCT INR: INR: 4.1

## 2016-09-29 ENCOUNTER — Other Ambulatory Visit: Payer: Self-pay | Admitting: *Deleted

## 2016-09-29 DIAGNOSIS — I4891 Unspecified atrial fibrillation: Secondary | ICD-10-CM

## 2016-09-29 DIAGNOSIS — E785 Hyperlipidemia, unspecified: Secondary | ICD-10-CM

## 2016-10-01 ENCOUNTER — Ambulatory Visit (INDEPENDENT_AMBULATORY_CARE_PROVIDER_SITE_OTHER): Payer: Medicare Other

## 2016-10-01 ENCOUNTER — Other Ambulatory Visit (INDEPENDENT_AMBULATORY_CARE_PROVIDER_SITE_OTHER): Payer: Medicare Other

## 2016-10-01 DIAGNOSIS — I482 Chronic atrial fibrillation, unspecified: Secondary | ICD-10-CM

## 2016-10-01 DIAGNOSIS — Z9889 Other specified postprocedural states: Secondary | ICD-10-CM | POA: Diagnosis not present

## 2016-10-01 DIAGNOSIS — Z5181 Encounter for therapeutic drug level monitoring: Secondary | ICD-10-CM | POA: Diagnosis not present

## 2016-10-01 DIAGNOSIS — I4891 Unspecified atrial fibrillation: Secondary | ICD-10-CM

## 2016-10-01 DIAGNOSIS — I059 Rheumatic mitral valve disease, unspecified: Secondary | ICD-10-CM | POA: Diagnosis not present

## 2016-10-01 DIAGNOSIS — E785 Hyperlipidemia, unspecified: Secondary | ICD-10-CM

## 2016-10-01 LAB — POCT INR: INR: 3.7

## 2016-10-02 LAB — CBC
HEMATOCRIT: 36.7 % (ref 34.0–46.6)
HEMOGLOBIN: 12 g/dL (ref 11.1–15.9)
MCH: 27.3 pg (ref 26.6–33.0)
MCHC: 32.7 g/dL (ref 31.5–35.7)
MCV: 83 fL (ref 79–97)
PLATELETS: 170 10*3/uL (ref 150–379)
RBC: 4.4 x10E6/uL (ref 3.77–5.28)
RDW: 15.8 % — ABNORMAL HIGH (ref 12.3–15.4)
WBC: 5.1 10*3/uL (ref 3.4–10.8)

## 2016-10-02 LAB — LIPID PANEL
Chol/HDL Ratio: 3.4 ratio (ref 0.0–4.4)
Cholesterol, Total: 157 mg/dL (ref 100–199)
HDL: 46 mg/dL (ref >39)
LDL Calculated: 89 mg/dL (ref 0–99)
Triglycerides: 111 mg/dL (ref 0–149)
VLDL CHOLESTEROL CAL: 22 mg/dL (ref 5–40)

## 2016-10-02 LAB — HEPATIC FUNCTION PANEL
ALK PHOS: 111 IU/L (ref 39–117)
ALT: 21 IU/L (ref 0–32)
AST: 28 IU/L (ref 0–40)
Albumin: 3.8 g/dL (ref 3.6–4.8)
BILIRUBIN TOTAL: 0.7 mg/dL (ref 0.0–1.2)
BILIRUBIN, DIRECT: 0.17 mg/dL (ref 0.00–0.40)
Total Protein: 7.1 g/dL (ref 6.0–8.5)

## 2016-10-03 ENCOUNTER — Encounter: Payer: Self-pay | Admitting: *Deleted

## 2016-10-14 ENCOUNTER — Ambulatory Visit
Admission: RE | Admit: 2016-10-14 | Discharge: 2016-10-14 | Disposition: A | Discharge status: Home / Self Care | Payer: Medicare Other | Source: Ambulatory Visit | Attending: General Surgery | Admitting: General Surgery

## 2016-10-14 ENCOUNTER — Encounter: Payer: Self-pay | Admitting: *Deleted

## 2016-10-14 DIAGNOSIS — R920 Mammographic microcalcification found on diagnostic imaging of breast: Secondary | ICD-10-CM

## 2016-10-14 DIAGNOSIS — R921 Mammographic calcification found on diagnostic imaging of breast: Secondary | ICD-10-CM | POA: Insufficient documentation

## 2016-10-20 ENCOUNTER — Other Ambulatory Visit: Payer: Self-pay

## 2016-10-20 DIAGNOSIS — L299 Pruritus, unspecified: Secondary | ICD-10-CM

## 2016-10-20 MED ORDER — LORATADINE 10 MG PO TABS: 10.0000 mg | ORAL_TABLET | Freq: Every day | ORAL | 11 refills | Status: DC

## 2016-10-20 NOTE — Telephone Encounter (Signed)
Last ov 06/03/16 Last filled 10/30/15

## 2016-10-21 ENCOUNTER — Ambulatory Visit (INDEPENDENT_AMBULATORY_CARE_PROVIDER_SITE_OTHER): Payer: Medicare Other | Admitting: General Surgery

## 2016-10-21 VITALS — BP 140/66 | HR 91 | Resp 14 | Ht 63.0 in | Wt 193.0 lb

## 2016-10-21 DIAGNOSIS — R920 Mammographic microcalcification found on diagnostic imaging of breast: Secondary | ICD-10-CM | POA: Diagnosis not present

## 2016-10-21 NOTE — Patient Instructions (Addendum)
The patient has been asked to return to the office in six months with a left diagnostic mammogram.

## 2016-10-21 NOTE — Progress Notes (Signed)
Patient ID: [name], female   DOB: 27-Jan-1956, 61 y.o.   MRN: [id number]  Chief Complaint  Patient presents with  . Follow-up    mammogram    HPI [name] is a 61 y.o. female who presents for a breast evaluation. The most recent mammogram was done on 10/14/2016.  Patient does perform regular self breast checks and gets regular mammograms done.    HPI  Past Medical History:  Diagnosis Date  . Anticoagulated on warfarin   . Chronic atrial fibrillation ([place])    SINCE 1997  . GERD (gastroesophageal reflux disease)   . Goiter    multinodular  . History of hyperthyroidism    2003--  PTU [place]  . HTN (hypertension)    unspecified  . Hypercholesterolemia   . Hyperlipidemia   . Pulmonary hypertension, mild ([place])   . Rheumatic heart disease    CARDIOLOGIST-- DR Marcello Moores WALL  . S/P MVR (mitral valve replacement)    1997  [facility]--  SECONDARY TO SEVERE MV STENOSIS    Past Surgical History:  Procedure Laterality Date  . ANTERIOR CERVICAL DECOMP/DISCECTOMY FUSION  12-21-2001   C5 --  C6  . CARDIAC CATHETERIZATION  12-28-2006  DR [facility]   NORMAL CORONARY ARTERIES  . CARPAL TUNNEL RELEASE Right 01-20-2007  . COLONOSCOPY  2015  . ERCP  08/15/2011   Procedure: ENDOSCOPIC RETROGRADE CHOLANGIOPANCREATOGRAPHY (ERCP);  Surgeon: Jeryl Columbia, MD;  Location: [facility] ENDOSCOPY;  Service: Endoscopy;  Laterality: N/A;  . ERCP     MULTIPLE--  INCLUDING STENTS, SPHINTEROTOMY'S  STONE REMOVAL   . FEMUR IM NAIL Right 04/26/2014   Procedure: INTRAMEDULLARY (IM) NAIL FEMORAL;  Surgeon: Alta Corning, MD;  Location: [place];  Service: Orthopedics;  Laterality: Right;  . I&D EXTREMITY Right 02/07/2013   Procedure: IRRIGATION AND DEBRIDEMENT OF RIGHT LOWER LEG WITH PLACEMENT OF ACELL AND WOULND VAC;  Surgeon: Theodoro Kos, DO;  Location: [place];  Service: Plastics;  Laterality: Right;  . LAPAROSCOPIC CHOLECYSTECTOMY  04-08-2002  . MITRAL VALVE REPLACEMENT  1997   [facility]  .  ORIF ANKLE FRACTURE Right 11/05/2012   Procedure: OPEN REDUCTION INTERNAL FIXATION (ORIF) ANKLE FRACTURE only operating on right;  Surgeon: Alta Corning, MD;  Location: [place];  Service: Orthopedics;  Laterality: Right;  . ORIF RIGHT ULNAR FX W/ ILIAC CREST BONE GRAFT  10-06-2008  . ORIF WRIST FRACTURE Right 04/26/2014   Procedure: OPEN REDUCTION INTERNAL FIXATION (ORIF) WRIST FRACTURE;  Surgeon: Alta Corning, MD;  Location: [place];  Service: Orthopedics;  Laterality: Right;  . TRANSTHORACIC ECHOCARDIOGRAM  06-29-2009  dr wall   normal lvsf/ ef 55-60%/  normal bileaflet mechanical mvr/ moderated dilated la/ moderate tr  . WRIST ARTHROSCOPY Right 12-10-2007   debridement and ulnar shortening osteotomy w/ plate    Family History  Problem Relation Age of Onset  . Hypothyroidism Sister   . Gallbladder disease Sister   . Coronary artery disease    . Diabetes Mother   . Hypertension Mother   . Heart disease Father   . Stroke Father   . Breast cancer Neg Hx     Social History Social History  Substance Use Topics  . Smoking status: Never Smoker  . Smokeless tobacco: Never Used  . Alcohol use No    No Known Allergies  Current Outpatient Prescriptions  Medication Sig Dispense Refill  . alendronate (FOSAMAX) 35 MG tablet Take 1 tablet (35 mg total) by mouth every 7 (  seven) days. Take with a full glass of water on an empty stomach. 12 tablet 3  . amitriptyline (ELAVIL) 50 MG tablet Take 50 mg by mouth daily.     Marland Kitchen aspirin EC 81 MG tablet Take 1 tablet (81 mg total) by mouth daily. 90 tablet 3  . atorvastatin (LIPITOR) 40 MG tablet Take 1 tablet (40 mg total) by mouth every evening. 90 tablet 3  . calcium carbonate (OSCAL) 1500 (600 Ca) MG TABS tablet Take 1 tablet by mouth daily.     . Cholecalciferol (VITAMIN D3) 50000 units CAPS Take 50,000 Units by mouth once a week. For 12 weeks, then switch to OTC Vitamin D3 2,000 unit daily 12 capsule 0  . dicyclomine (BENTYL) 10 MG capsule Take 1  capsule (10 mg total) by mouth 4 (four) times daily -  before meals and at bedtime. 90 capsule 3  . fluticasone (FLONASE) 50 MCG/ACT nasal spray Place 2 sprays into both nostrils daily. 16 g 11  . gabapentin (NEURONTIN) 300 MG capsule Take 1 capsule by mouth daily as needed (nerve pain).     Marland Kitchen lisinopril (PRINIVIL,ZESTRIL) 40 MG tablet Take 1 tablet (40 mg total) by mouth daily. 90 tablet 3  . loratadine (CLARITIN) 10 MG tablet Take 1 tablet (10 mg total) by mouth daily. 30 tablet 11  . metoprolol succinate (TOPROL-XL) 100 MG 24 hr tablet Take 1 tablet (100 mg total) by mouth daily. Take with or immediately following a meal. 90 tablet 3  . Multiple Vitamins-Minerals (CENTRUM SILVER ADULT 50+ PO) Take 1 tablet by mouth daily.    Marland Kitchen omeprazole (PRILOSEC) 20 MG capsule Take 20 mg by mouth every morning.     . warfarin (COUMADIN) 5 MG tablet Take 1 tablet by mouth daily or as directed by Coumadin clinic. 95 tablet 1   No current facility-administered medications for this visit.     Review of Systems Review of Systems  Constitutional: Negative.   Respiratory: Negative.   Cardiovascular: Negative.     Blood pressure 140/66, pulse 91, resp. rate 14, height 5\' 3"  (1.6 m), weight 193 lb (87.5 kg).  Physical Exam Physical Exam  Constitutional: She is oriented to person, place, and time. She appears well-developed and well-nourished.  Eyes: Conjunctivae are normal. No scleral icterus.  Neck: Neck supple.  Cardiovascular: Normal rate and normal heart sounds.  An irregular rhythm present.  Mitral valve click  Pulmonary/Chest: Effort normal and breath sounds normal. Right breast exhibits no inverted nipple, no mass, no nipple discharge, no skin change and no tenderness. Left breast exhibits no inverted nipple, no mass, no nipple discharge, no skin change and no tenderness.  Lymphadenopathy:    She has no cervical adenopathy.    She has no axillary adenopathy.  Neurological: She is alert and  oriented to person, place, and time.  Skin: Skin is warm and dry.    Data Reviewed Bilateral diagnostic mammograms dated 10/14/2016 were reviewed and compared to prior studies. No interval change in the focal area of microcalcifications in the left breast. Recommendation for stereotactic biopsy noted. BI-RADS-4.  Assessment    No interval change and incidentally noted microcalcifications in the left breast area  Chronic anticoagulation therapy secondary to prosthetic mechanical valve.    Plan    The patient reports she was encouraged to have a biopsy completed the day of the mammogram noted above. In light of her need for ongoing anticoagulation, with her INR completed 10/01/2016 at 3.7-this would put the patient  at undue risk for bleeding and hematoma formation.  Our options at this time remain as in the past: 1) sees continued observation with stable calcifications versus 2) wire localization and open biopsy.  At this time, the patient denies both comfortable with observation.    The patient has been asked to return to the office in six months with a left diagnostic mammogram.  HPI, Physical Exam, Assessment and Plan have been scribed under the direction and in the presence of Hervey Ard, MD.  Gaspar Cola, CMA  I have completed the exam and reviewed the above documentation for accuracy and completeness.  I agree with the above.  Haematologist has been used and any errors in dictation or transcription are unintentional.  Hervey Ard, M.D., F.A.C.S.   Robert Bellow 10/21/2016, 1:43 PM

## 2016-10-22 ENCOUNTER — Other Ambulatory Visit: Payer: Self-pay | Admitting: General Surgery

## 2016-10-22 ENCOUNTER — Telehealth: Payer: Self-pay | Admitting: *Deleted

## 2016-10-22 ENCOUNTER — Other Ambulatory Visit: Payer: Self-pay | Admitting: *Deleted

## 2016-10-22 DIAGNOSIS — R920 Mammographic microcalcification found on diagnostic imaging of breast: Secondary | ICD-10-CM

## 2016-10-22 NOTE — Telephone Encounter (Signed)
Patient called and wishes to proceed with wire localization and open biopsy. She states her family encouraged her to proceed rather than wait.

## 2016-10-22 NOTE — Telephone Encounter (Signed)
Reviewed updates. Will follow-up procedure and results.

## 2016-10-22 NOTE — Telephone Encounter (Signed)
Patient has been scheduled for a left breast stereotactic biopsy at [facility] for 10-27-16 at 2 pm. She will check-in at the [facility] at 1:30 pm. This patient is aware of date, time, and instructions.   This patient will continue coumadin but has been asked to hold aspirin.  Patient verbalizes understanding.

## 2016-10-22 NOTE — Telephone Encounter (Signed)
I spoke with the patient about her desire to proceed to biopsy based on her family recommendation.  I spoke with Pamelia Hoit, M.D. from radiology regarding biopsies being completed while on Coumadin therapy. She reports her experience has been favorable with out an excessively high hematoma rate. They do hold aspirin therapy for these patients prior to biopsy.  I spoke with the patient about proceeding to a stereo biopsy which would be less invasive recognizing that we may have a complicating hematoma that would require incision and drainage. This would still likely be less morbid than open biopsy with wire localization.  We'll arrange to do this at the next available time. The patient did request that I complete the procedure.

## 2016-10-27 ENCOUNTER — Ambulatory Visit
Admission: RE | Admit: 2016-10-27 | Discharge: 2016-10-27 | Disposition: A | Discharge status: Home / Self Care | Payer: Medicare Other | Source: Ambulatory Visit | Attending: General Surgery | Admitting: General Surgery

## 2016-10-27 ENCOUNTER — Other Ambulatory Visit: Payer: Self-pay | Admitting: *Deleted

## 2016-10-27 DIAGNOSIS — R920 Mammographic microcalcification found on diagnostic imaging of breast: Secondary | ICD-10-CM

## 2016-10-28 ENCOUNTER — Other Ambulatory Visit: Payer: Self-pay | Admitting: General Surgery

## 2016-10-28 DIAGNOSIS — R920 Mammographic microcalcification found on diagnostic imaging of breast: Secondary | ICD-10-CM

## 2016-10-29 ENCOUNTER — Ambulatory Visit (INDEPENDENT_AMBULATORY_CARE_PROVIDER_SITE_OTHER): Payer: Medicare Other

## 2016-10-29 ENCOUNTER — Encounter
Admission: RE | Admit: 2016-10-29 | Discharge: 2016-10-29 | Disposition: A | Discharge status: Home / Self Care | Payer: Medicare Other | Source: Ambulatory Visit | Attending: General Surgery | Admitting: General Surgery

## 2016-10-29 ENCOUNTER — Encounter: Payer: Self-pay | Admitting: *Deleted

## 2016-10-29 DIAGNOSIS — I059 Rheumatic mitral valve disease, unspecified: Secondary | ICD-10-CM | POA: Diagnosis not present

## 2016-10-29 DIAGNOSIS — I482 Chronic atrial fibrillation, unspecified: Secondary | ICD-10-CM

## 2016-10-29 DIAGNOSIS — Z5181 Encounter for therapeutic drug level monitoring: Secondary | ICD-10-CM | POA: Diagnosis not present

## 2016-10-29 DIAGNOSIS — I4891 Unspecified atrial fibrillation: Secondary | ICD-10-CM

## 2016-10-29 DIAGNOSIS — Z9889 Other specified postprocedural states: Secondary | ICD-10-CM | POA: Diagnosis not present

## 2016-10-29 HISTORY — DX: Thyrotoxicosis, unspecified without thyrotoxic crisis or storm: E05.90

## 2016-10-29 HISTORY — DX: Cardiac murmur, unspecified: R01.1

## 2016-10-29 HISTORY — DX: Unspecified osteoarthritis, unspecified site: M19.90

## 2016-10-29 HISTORY — DX: Cardiac arrhythmia, unspecified: I49.9

## 2016-10-29 LAB — POCT INR: INR: 2.5

## 2016-10-29 NOTE — Progress Notes (Signed)
Patient's surgery has been scheduled for 11-03-16 at [facility].   This patient does NOT stop coumadin.

## 2016-10-29 NOTE — Pre-Procedure Instructions (Signed)
Progress Notes Encounter Date: 07/17/2016 Lelon Perla, MD  Cardiology    [] Hide copied text      HPI: FU atrial fibrillation and previous mitral valve replacement for rheumatic heart disease. Patient had mitral valve replacement in 1997. Cardiac catheterization in 2008 showed normal coronary arteries. ABIs with Doppler in July of 2014 normal. Last echocardiogram December 2014 showed normal LV function, trace aortic insufficiency, normally functioning prosthetic mitral valve, severe left atrial enlargement. Holter monitor January 2015 showed heart rate was elevated and beta blocker was increased. Since last seen, patient denies dyspnea, chest pain, palpitations or syncope. No bleeding.        Current Outpatient Prescriptions  Medication Sig Dispense Refill  . alendronate (FOSAMAX) 35 MG tablet Take 1 tablet (35 mg total) by mouth every 7 (seven) days. Take with a full glass of water on an empty stomach. 12 tablet 3  . amitriptyline (ELAVIL) 50 MG tablet Take 50 mg by mouth daily.     Marland Kitchen atorvastatin (LIPITOR) 40 MG tablet Take 1 tablet (40 mg total) by mouth every evening. 90 tablet 3  . calcium carbonate (OSCAL) 1500 (600 Ca) MG TABS tablet Take 1 tablet by mouth daily.     . Cholecalciferol (VITAMIN D3) 50000 units CAPS Take 50,000 Units by mouth once a week. For 12 weeks, then switch to OTC Vitamin D3 2,000 unit daily 12 capsule 0  . dicyclomine (BENTYL) 10 MG capsule Take 1 capsule (10 mg total) by mouth 4 (four) times daily -  before meals and at bedtime. 90 capsule 3  . fluticasone (FLONASE) 50 MCG/ACT nasal spray Place 2 sprays into both nostrils daily. 16 g 11  . gabapentin (NEURONTIN) 300 MG capsule Take 1 capsule by mouth daily as needed (nerve pain).     Marland Kitchen lisinopril (PRINIVIL,ZESTRIL) 20 MG tablet Take 1 tablet (20 mg total) by mouth daily. 30 tablet 11  . loratadine (CLARITIN) 10 MG tablet Take 1 tablet (10 mg total) by mouth daily. 30 tablet 11  . metoprolol  succinate (TOPROL-XL) 100 MG 24 hr tablet Take 1 tablet (100 mg total) by mouth daily. Take with or immediately following a meal. 90 tablet 2  . Multiple Vitamins-Minerals (CENTRUM SILVER ADULT 50+ PO) Take 1 tablet by mouth daily.    Marland Kitchen omeprazole (PRILOSEC) 20 MG capsule Take 20 mg by mouth every morning.     . warfarin (COUMADIN) 5 MG tablet Take 1 tablet by mouth daily or as directed by Coumadin clinic. 95 tablet 1   No current facility-administered medications for this visit.          Past Medical History:  Diagnosis Date  . Anticoagulated on warfarin   . Chronic atrial fibrillation ([place])    SINCE 1997  . GERD (gastroesophageal reflux disease)   . Goiter    multinodular  . History of hyperthyroidism    2003--  PTU [place]  . HTN (hypertension)    unspecified  . Hypercholesterolemia   . Hyperlipidemia   . Pulmonary hypertension, mild   . Rheumatic heart disease    CARDIOLOGIST-- DR Marcello Moores WALL  . S/P MVR (mitral valve replacement)    1997  [facility]--  SECONDARY TO SEVERE MV STENOSIS         Past Surgical History:  Procedure Laterality Date  . ANTERIOR CERVICAL DECOMP/DISCECTOMY FUSION  12-21-2001   C5 --  C6  . CARDIAC CATHETERIZATION  12-28-2006  DR [facility]   NORMAL CORONARY ARTERIES  . CARPAL TUNNEL RELEASE  Right 01-20-2007  . COLONOSCOPY  2015  . ERCP  08/15/2011   Procedure: ENDOSCOPIC RETROGRADE CHOLANGIOPANCREATOGRAPHY (ERCP);  Surgeon: Jeryl Columbia, MD;  Location: [facility] ENDOSCOPY;  Service: Endoscopy;  Laterality: N/A;  . ERCP     MULTIPLE--  INCLUDING STENTS, SPHINTEROTOMY'S  STONE REMOVAL   . FEMUR IM NAIL Right 04/26/2014   Procedure: INTRAMEDULLARY (IM) NAIL FEMORAL;  Surgeon: Alta Corning, MD;  Location: [place];  Service: Orthopedics;  Laterality: Right;  . I&D EXTREMITY Right 02/07/2013   Procedure: IRRIGATION AND DEBRIDEMENT OF RIGHT LOWER LEG WITH PLACEMENT OF ACELL AND WOULND VAC;  Surgeon: Theodoro Kos, DO;  Location:  [place];  Service: Plastics;  Laterality: Right;  . LAPAROSCOPIC CHOLECYSTECTOMY  04-08-2002  . MITRAL VALVE REPLACEMENT  1997   [facility] [place]  . ORIF ANKLE FRACTURE Right 11/05/2012   Procedure: OPEN REDUCTION INTERNAL FIXATION (ORIF) ANKLE FRACTURE only operating on right;  Surgeon: Alta Corning, MD;  Location: [place];  Service: Orthopedics;  Laterality: Right;  . ORIF RIGHT ULNAR FX W/ ILIAC CREST BONE GRAFT  10-06-2008  . ORIF WRIST FRACTURE Right 04/26/2014   Procedure: OPEN REDUCTION INTERNAL FIXATION (ORIF) WRIST FRACTURE;  Surgeon: Alta Corning, MD;  Location: [place];  Service: Orthopedics;  Laterality: Right;  . TRANSTHORACIC ECHOCARDIOGRAM  06-29-2009  dr wall   normal lvsf/ ef 55-60%/  normal bileaflet mechanical mvr/ moderated dilated la/ moderate tr  . WRIST ARTHROSCOPY Right 12-10-2007   debridement and ulnar shortening osteotomy w/ plate    Social History        Social History  . Marital status: Divorced    Spouse name: N/A  . Number of children: N/A  . Years of education: N/A       Occupational History  . Control and instrumentation engineer        Social History Main Topics  . Smoking status: Never Smoker  . Smokeless tobacco: Never Used  . Alcohol use No  . Drug use: No  . Sexual activity: Not on file       Other Topics Concern  . Not on file      Social History Narrative  . No narrative on file         Family History  Problem Relation Age of Onset  . Hypothyroidism Sister   . Gallbladder disease Sister   . Coronary artery disease    . Diabetes Mother   . Hypertension Mother   . Heart disease Father   . Stroke Father   . Breast cancer Neg Hx     ROS: no fevers or chills, productive cough, hemoptysis, dysphasia, odynophagia, melena, hematochezia, dysuria, hematuria, rash, seizure activity, orthopnea, PND, pedal edema, claudication. Remaining systems are negative.  Physical Exam: Well-developed well-nourished in no  acute distress.  Skin is warm and dry.  HEENT is normal.  Neck is supple.  Chest is clear to auscultation with normal expansion.  Cardiovascular exam is irregular, crisp mechanical valve sound  Abdominal exam nontender or distended. No masses palpated. Extremities show no edema. neuro grossly intact  ECG-Atrial fibrillation at a rate of 90. Nonspecific ST changes.  A/P  1 Permanent atrial fibrillation-continue beta blocker for rate control. Continue Coumadin. Check CBC.  2 hypertension-blood pressure elevated. Continue present medications and increase lisinopril to 40 mg daily. Check potassium and renal function in one week.  3 hyperlipidemia-continue statin. Check lipids and liver.  4 status post mitral valve replacement-continue Coumadin. Add aspirin 81 mg daily. Continue  SBE prophylaxis.  Kirk Ruths, MD     Electronically signed by Lelon Perla, MD at 07/17/2016 9:09 AM      Office Visit on 07/17/2016        Detailed Report

## 2016-10-29 NOTE — Patient Instructions (Signed)
  Your procedure is scheduled on: 11-03-16 Report to [place] AT 10:45 PER PT  Remember: Instructions that are not followed completely may result in serious medical risk, up to and including death, or upon the discretion of your surgeon and anesthesiologist your surgery may need to be rescheduled.    _x___ 1. Do not eat food or drink liquids after midnight. No gum chewing or  hard candies.     __x__ 2. No Alcohol for 24 hours before or after surgery.   __x__3. No Smoking for 24 prior to surgery.   ____  4. Bring all medications with you on the day of surgery if instructed.    __x__ 5. Notify your doctor if there is any change in your medical condition     (cold, fever, infections).     Do not wear jewelry, make-up, hairpins, clips or nail polish.  Do not wear lotions, powders, or perfumes. You may wear deodorant.  Do not shave 48 hours prior to surgery. Men may shave face and neck.  Do not bring valuables to the hospital.    [facility] is not responsible for any belongings or valuables.               Contacts, dentures or bridgework may not be worn into surgery.  Leave your suitcase in the car. After surgery it may be brought to your room.  For patients admitted to the hospital, discharge time is determined by your  treatment team.   Patients discharged the day of surgery will not be allowed to drive home.  You will need someone to drive you home and stay with you the night of your procedure.    Please read over the following fact sheets that you were given:     _x___ Take anti-hypertensive (unless it includes a diuretic), cardiac, seizure, asthma,     anti-reflux and psychiatric medicines. These include:  1. METOPROLOL  2.  3.  4.  5.  6.  ____Fleets enema or Magnesium Citrate as directed.   ____ Use CHG Soap or sage wipes as directed on instruction sheet   ____ Use inhalers on the day of surgery and bring to hospital day of surgery  ____ Stop Metformin and  Janumet 2 days prior to surgery.    ____ Take 1/2 of usual insulin dose the night before surgery and none on the morning surgery.   _x___ Follow recommendations from Cardiologist, Pulmonologist or PCP regarding stopping Aspirin, Coumadin, Pllavix ,Eliquis, Effient, or Pradaxa, and Pletal-PT STATES DR BYRNETT TOLD HER TO STOP ASA ON 10-19-16- PT TO CONTINUE COUMADIN PER DR BYRNETT-DO NOT TAKE AM OF SURGERY  ____Stop Anti-inflammatories such as Advil, Aleve, Ibuprofen, Motrin, Naproxen, Naprosyn, Goodies powders or aspirin products. OK to take Tylenol .   ____ Stop supplements until after surgery.  But may continue Vitamin D, Vitamin B,       and multivitamin.   ____ Bring C-Pap to the hospital.

## 2016-11-03 ENCOUNTER — Ambulatory Visit
Admission: RE | Admit: 2016-11-03 | Discharge: 2016-11-03 | Disposition: A | Discharge status: Home / Self Care | Payer: Medicare Other | Source: Ambulatory Visit | Attending: General Surgery | Admitting: General Surgery

## 2016-11-03 ENCOUNTER — Ambulatory Visit: Payer: Medicare Other | Admitting: Anesthesiology

## 2016-11-03 ENCOUNTER — Encounter
Admission: RE | Disposition: A | Discharge status: Home / Self Care | Payer: Self-pay | Source: Ambulatory Visit | Attending: General Surgery

## 2016-11-03 DIAGNOSIS — K219 Gastro-esophageal reflux disease without esophagitis: Secondary | ICD-10-CM | POA: Insufficient documentation

## 2016-11-03 DIAGNOSIS — I10 Essential (primary) hypertension: Secondary | ICD-10-CM | POA: Insufficient documentation

## 2016-11-03 DIAGNOSIS — R920 Mammographic microcalcification found on diagnostic imaging of breast: Secondary | ICD-10-CM | POA: Insufficient documentation

## 2016-11-03 DIAGNOSIS — E0590 Thyrotoxicosis, unspecified without thyrotoxic crisis or storm: Secondary | ICD-10-CM | POA: Diagnosis not present

## 2016-11-03 DIAGNOSIS — I4891 Unspecified atrial fibrillation: Secondary | ICD-10-CM | POA: Diagnosis not present

## 2016-11-03 DIAGNOSIS — R921 Mammographic calcification found on diagnostic imaging of breast: Secondary | ICD-10-CM | POA: Diagnosis not present

## 2016-11-03 DIAGNOSIS — M1990 Unspecified osteoarthritis, unspecified site: Secondary | ICD-10-CM | POA: Diagnosis not present

## 2016-11-03 HISTORY — PX: BREAST EXCISIONAL BIOPSY: SUR124

## 2016-11-03 HISTORY — PX: BREAST BIOPSY: SHX20

## 2016-11-03 SURGERY — BREAST BIOPSY WITH NEEDLE LOCALIZATION
Anesthesia: General | Laterality: Left | Wound class: Clean

## 2016-11-03 MED ORDER — WARFARIN SODIUM 5 MG PO TABS: ORAL_TABLET | ORAL | 1 refills | Status: DC

## 2016-11-03 MED ORDER — FENTANYL CITRATE (PF) 100 MCG/2ML IJ SOLN
INTRAMUSCULAR | Status: DC | PRN
  Administered 2016-11-03: 50 ug via INTRAVENOUS

## 2016-11-03 MED ORDER — PROPOFOL 10 MG/ML IV BOLUS
INTRAVENOUS | Status: AC
  Filled 2016-11-03: qty 20

## 2016-11-03 MED ORDER — PROPOFOL 10 MG/ML IV BOLUS
INTRAVENOUS | Status: DC | PRN
  Administered 2016-11-03: 120 mg via INTRAVENOUS

## 2016-11-03 MED ORDER — LACTATED RINGERS IV SOLN
INTRAVENOUS | Status: DC
  Administered 2016-11-03 (×2): via INTRAVENOUS

## 2016-11-03 MED ORDER — FENTANYL CITRATE (PF) 100 MCG/2ML IJ SOLN: 25.0000 ug | INTRAMUSCULAR | Status: DC | PRN

## 2016-11-03 MED ORDER — BUPIVACAINE-EPINEPHRINE 0.5% -1:200000 IJ SOLN
INTRAMUSCULAR | Status: DC | PRN
  Administered 2016-11-03: 30 mL

## 2016-11-03 MED ORDER — LIDOCAINE HCL (CARDIAC) 20 MG/ML IV SOLN
INTRAVENOUS | Status: DC | PRN
  Administered 2016-11-03: 80 mg via INTRAVENOUS

## 2016-11-03 MED ORDER — PHENYLEPHRINE HCL 10 MG/ML IJ SOLN
INTRAMUSCULAR | Status: DC | PRN
  Administered 2016-11-03: 200 ug via INTRAVENOUS
  Administered 2016-11-03 (×4): 100 ug via INTRAVENOUS
  Administered 2016-11-03: 200 ug via INTRAVENOUS
  Administered 2016-11-03: 100 ug via INTRAVENOUS

## 2016-11-03 MED ORDER — FENTANYL CITRATE (PF) 100 MCG/2ML IJ SOLN
INTRAMUSCULAR | Status: AC
  Filled 2016-11-03: qty 2

## 2016-11-03 MED ORDER — BUPIVACAINE-EPINEPHRINE (PF) 0.5% -1:200000 IJ SOLN
INTRAMUSCULAR | Status: AC
  Filled 2016-11-03: qty 30

## 2016-11-03 MED ORDER — ACETAMINOPHEN 10 MG/ML IV SOLN
INTRAVENOUS | Status: AC
Start: 2016-11-03 — End: 2016-11-03
  Filled 2016-11-03: qty 100

## 2016-11-03 MED ORDER — FAMOTIDINE 20 MG PO TABS
20.0000 mg | ORAL_TABLET | Freq: Once | ORAL | Status: AC
  Administered 2016-11-03: 20 mg via ORAL

## 2016-11-03 MED ORDER — GLYCOPYRROLATE 0.2 MG/ML IJ SOLN
INTRAMUSCULAR | Status: DC | PRN
  Administered 2016-11-03: 0.2 mg via INTRAVENOUS

## 2016-11-03 MED ORDER — FAMOTIDINE 20 MG PO TABS
ORAL_TABLET | ORAL | Status: AC
  Administered 2016-11-03: 20 mg via ORAL
  Filled 2016-11-03: qty 1

## 2016-11-03 MED ORDER — ACETAMINOPHEN 10 MG/ML IV SOLN
INTRAVENOUS | Status: DC | PRN
Start: 2016-11-03 — End: 2016-11-03
  Administered 2016-11-03: 1000 mg via INTRAVENOUS

## 2016-11-03 MED ORDER — DEXAMETHASONE SODIUM PHOSPHATE 10 MG/ML IJ SOLN
INTRAMUSCULAR | Status: DC | PRN
  Administered 2016-11-03: 4 mg via INTRAVENOUS

## 2016-11-03 MED ORDER — HYDROCODONE-ACETAMINOPHEN 5-325 MG PO TABS: 1.0000 | ORAL_TABLET | ORAL | 0 refills | Status: DC | PRN

## 2016-11-03 MED ORDER — ONDANSETRON HCL 4 MG/2ML IJ SOLN
INTRAMUSCULAR | Status: DC | PRN
  Administered 2016-11-03: 4 mg via INTRAVENOUS

## 2016-11-03 MED ORDER — ONDANSETRON HCL 4 MG/2ML IJ SOLN: 4.0000 mg | Freq: Once | INTRAMUSCULAR | Status: DC | PRN

## 2016-11-03 SURGICAL SUPPLY — 39 items
BANDAGE ELASTIC 6 LF NS (GAUZE/BANDAGES/DRESSINGS) ×3 IMPLANT
BLADE SURG 15 STRL SS SAFETY (BLADE) ×6 IMPLANT
BNDG GAUZE 4.5X4.1 6PLY STRL (MISCELLANEOUS) IMPLANT
CANISTER SUCT 1200ML W/VALVE (MISCELLANEOUS) ×3 IMPLANT
CHLORAPREP W/TINT 26ML (MISCELLANEOUS) ×3 IMPLANT
CLOSURE WOUND 1/2 X4 (GAUZE/BANDAGES/DRESSINGS) ×2
CNTNR SPEC 2.5X3XGRAD LEK (MISCELLANEOUS)
CONT SPEC 4OZ STER OR WHT (MISCELLANEOUS)
CONTAINER SPEC 2.5X3XGRAD LEK (MISCELLANEOUS) IMPLANT
COVER PROBE FLX POLY STRL (MISCELLANEOUS) ×3 IMPLANT
DEVICE DUBIN SPECIMEN MAMMOGRA (MISCELLANEOUS) ×3 IMPLANT
DRAPE CHEST BREAST 77X106 FENE (MISCELLANEOUS) IMPLANT
DRAPE LAPAROTOMY 100X77 ABD (DRAPES) ×3 IMPLANT
DRSG TELFA 4X3 1S NADH ST (GAUZE/BANDAGES/DRESSINGS) ×6 IMPLANT
ELECT CAUTERY BLADE TIP 2.5 (TIP) ×3
ELECT REM PT RETURN 9FT ADLT (ELECTROSURGICAL) ×3
ELECTRODE CAUTERY BLDE TIP 2.5 (TIP) ×1 IMPLANT
ELECTRODE REM PT RTRN 9FT ADLT (ELECTROSURGICAL) ×1 IMPLANT
GAUZE FLUFF 18X24 1PLY STRL (GAUZE/BANDAGES/DRESSINGS) ×3 IMPLANT
GLOVE BIO SURGEON STRL SZ7.5 (GLOVE) ×18 IMPLANT
GLOVE INDICATOR 8.0 STRL GRN (GLOVE) ×3 IMPLANT
GOWN STRL REUS W/ TWL LRG LVL3 (GOWN DISPOSABLE) ×3 IMPLANT
GOWN STRL REUS W/TWL LRG LVL3 (GOWN DISPOSABLE) ×6
KIT RM TURNOVER STRD PROC AR (KITS) ×3 IMPLANT
LABEL OR SOLS (LABEL) ×3 IMPLANT
MARGIN MAP 10MM (MISCELLANEOUS) ×3 IMPLANT
NDL SAFETY 22GX1.5 (NEEDLE) ×3 IMPLANT
NEEDLE HYPO 25X1 1.5 SAFETY (NEEDLE) ×3 IMPLANT
PACK BASIN MINOR ARMC (MISCELLANEOUS) ×3 IMPLANT
STRIP CLOSURE SKIN 1/2X4 (GAUZE/BANDAGES/DRESSINGS) ×4 IMPLANT
SUT ETHILON 3-0 FS-10 30 BLK (SUTURE) ×3
SUT VIC AB 2-0 CT1 27 (SUTURE) ×2
SUT VIC AB 2-0 CT1 TAPERPNT 27 (SUTURE) ×1 IMPLANT
SUT VIC AB 4-0 FS2 27 (SUTURE) ×3 IMPLANT
SUTURE EHLN 3-0 FS-10 30 BLK (SUTURE) ×1 IMPLANT
SWABSTK COMLB BENZOIN TINCTURE (MISCELLANEOUS) ×3 IMPLANT
SYR CONTROL 10ML (SYRINGE) ×3 IMPLANT
TAPE TRANSPORE STRL 2 31045 (GAUZE/BANDAGES/DRESSINGS) ×3 IMPLANT
WATER STERILE IRR 1000ML POUR (IV SOLUTION) ×3 IMPLANT

## 2016-11-03 NOTE — OR Nursing (Signed)
Dr Bary Castilla discontinued PT_INR.  Dr Marcello Moores aware.  No new orders at this time.

## 2016-11-03 NOTE — Anesthesia Preprocedure Evaluation (Addendum)
Anesthesia Evaluation  Patient identified by MRN, date of birth, ID band Patient awake    Reviewed: Allergy & Precautions, NPO status , Patient's Chart, lab work & pertinent test results, reviewed documented beta blocker date and time   Airway Mallampati: III  TM Distance: >3 FB     Dental  (+) Chipped, Poor Dentition, Missing   Pulmonary           Cardiovascular hypertension, + dysrhythmias Atrial Fibrillation + Valvular Problems/Murmurs      Neuro/Psych  Neuromuscular disease    GI/Hepatic GERD  Controlled,  Endo/Other  Hyperthyroidism   Renal/GU      Musculoskeletal  (+) Arthritis ,   Abdominal   Peds  Hematology   Anesthesia Other Findings Good neck movement. MVR.  Reproductive/Obstetrics                            Anesthesia Physical Anesthesia Plan  ASA: III  Anesthesia Plan: General   Post-op Pain Management:    Induction: Intravenous  Airway Management Planned: LMA  Additional Equipment:   Intra-op Plan:   Post-operative Plan:   Informed Consent: I have reviewed the patients History and Physical, chart, labs and discussed the procedure including the risks, benefits and alternatives for the proposed anesthesia with the patient or authorized representative who has indicated his/her understanding and acceptance.     Plan Discussed with: CRNA  Anesthesia Plan Comments:         Anesthesia Quick Evaluation

## 2016-11-03 NOTE — Op Note (Signed)
Preoperative diagnosis: Microcalcifications left breast, upper outer quadrant.  Postoperative diagnosis: Same.  Operative procedure: Left breast biopsy with needle localization.  Operating surgeon: Hervey Ard, M.D.  Anesthesia: Gen. by LMA, Marcaine 0.5% with 1-200,000 epinephrine, 30 mL.  Estimated blood loss: Less than 10 mL.  Clinical note: This 61 year old woman on chronic anticoagulation for mechanical heart valve was noted to have a cluster microcalcifications in the upper-outer quadrant left breast. These remained stable over the last 18 months. She still been recommended to have biopsy by the radiology service. Attempted stereotactic biopsy last week was unsuccessful due to inability to locate the area. She was brought to the operating having undergone needle localization.  Operative note: With the patient under adequate general anesthesia the area of the breast and axilla was prepped with ChloraPrep and draped. Marcaine was infiltrated for postoperative analgesia. A curvilinear incision anterior to the needle was made and carried down through skin and subcutaneous tissue with hemostasis achieved by electrocautery. The localizing wire was brought into the wound. A 5 cm long 1.5 cm diameter core of tissue was removed. Specimen radiograph confirmed the area of microcalcifications were contained within the [facility] sample. Hemostasis was with electrocautery. The deep tissue was approximated with interrupted 2-0 Vicryl figure-of-eight sutures. A medium Hemoclip was placed to locate the biopsy cavity. The adipose layer was approximated with 2-0 Vicryl figure-of-eight suture. Skin was closed with a running 4-0 Vicryl septic suture. Benzoin, Steri-Strips and Telfa pad were applied. Fluff gauze and a surgical bra were applied.  The patient tolerated the procedure well was taken to recovery in stable condition.

## 2016-11-03 NOTE — H&P (Signed)
No change in clinical exam or history.

## 2016-11-03 NOTE — Transfer of Care (Signed)
Immediate Anesthesia Transfer of Care Note  Patient: [name]  Procedure(s) Performed: Procedure(s): BREAST BIOPSY WITH NEEDLE LOCALIZATION (Left)  Patient Location: PACU  Anesthesia Type:General  Level of Consciousness: awake, alert  and oriented  Airway & Oxygen Therapy: Patient Spontanous Breathing and Patient connected to face mask oxygen  Post-op Assessment: Report given to RN, Post -op Vital signs reviewed and stable, Patient moving all extremities X 4 and Patient able to stick tongue midline  Post vital signs: stable  Last Vitals:  Vitals:   11/03/16 1139 11/03/16 1302  BP:  124/85  Pulse:  100  Resp:  (!) 32  Temp: 37 C 36.7 C    Last Pain:  Vitals:   11/03/16 1139  TempSrc: Oral         Complications: No apparent anesthesia complications

## 2016-11-03 NOTE — Discharge Instructions (Signed)

## 2016-11-03 NOTE — Anesthesia Post-op Follow-up Note (Cosign Needed)
Anesthesia QCDR form completed.        

## 2016-11-03 NOTE — Anesthesia Postprocedure Evaluation (Signed)
Anesthesia Post Note  Patient: [name]  Procedure(s) Performed: Procedure(s) (LRB): BREAST BIOPSY WITH NEEDLE LOCALIZATION (Left)  Patient location during evaluation: PACU Anesthesia Type: General Level of consciousness: awake and alert Pain management: pain level controlled Vital Signs Assessment: post-procedure vital signs reviewed and stable Respiratory status: spontaneous breathing, nonlabored ventilation, respiratory function stable and patient connected to nasal cannula oxygen Cardiovascular status: blood pressure returned to baseline and stable Postop Assessment: no signs of nausea or vomiting Anesthetic complications: no     Last Vitals:  Vitals:   11/03/16 1317 11/03/16 1327  BP: 120/62 109/72  Pulse: (!) 101 (!) 102  Resp: 19 (!) 22  Temp:  36.7 C    Last Pain:  Vitals:   11/03/16 1139  TempSrc: Oral                 Ocia Simek S

## 2016-11-03 NOTE — Anesthesia Procedure Notes (Signed)
Procedure Name: LMA Insertion Date/Time: 11/03/2016 12:10 PM Performed by: Tamala Julian, Mathis Cashman Pre-anesthesia Checklist: Patient identified, Emergency Drugs available, Suction available, Patient being monitored and Timeout performed Patient Re-evaluated:Patient Re-evaluated prior to inductionOxygen Delivery Method: Circle system utilized Preoxygenation: Pre-oxygenation with 100% oxygen Intubation Type: IV induction Ventilation: Mask ventilation without difficulty LMA: LMA inserted LMA Size: 4.0 Tube type: Oral Tube size: 4.0 mm Number of attempts: 1 Placement Confirmation: positive ETCO2 and breath sounds checked- equal and bilateral Tube secured with: Tape Dental Injury: Teeth and Oropharynx as per pre-operative assessment

## 2016-11-04 ENCOUNTER — Encounter: Payer: Self-pay | Admitting: General Surgery

## 2016-11-04 ENCOUNTER — Telehealth: Payer: Self-pay | Admitting: General Surgery

## 2016-11-04 LAB — SURGICAL PATHOLOGY

## 2016-11-04 NOTE — Telephone Encounter (Signed)
Notified that pathology from yesterday's breast biopsy was benign. She reports she took half of Norco last night. No narcotics today. Follow-up next week as scheduled.

## 2016-11-10 ENCOUNTER — Ambulatory Visit (INDEPENDENT_AMBULATORY_CARE_PROVIDER_SITE_OTHER): Payer: Medicare Other | Admitting: General Surgery

## 2016-11-10 ENCOUNTER — Encounter: Payer: Self-pay | Admitting: General Surgery

## 2016-11-10 VITALS — BP 144/83 | HR 89 | Resp 15 | Ht 63.0 in | Wt 190.0 lb

## 2016-11-10 DIAGNOSIS — R920 Mammographic microcalcification found on diagnostic imaging of breast: Secondary | ICD-10-CM

## 2016-11-10 NOTE — Progress Notes (Signed)
Patient ID: [name], female   DOB: 1955-11-26, 61 y.o.   MRN: [id number]  Chief Complaint  Patient presents with  . Routine Post Op    HPI [name] is a 61 y.o. female here for a post op exam from a left breast lumpectomy done on 11/03/16. She reports that she is doing well and is not needing to take her prescription pain medication.  HPI  Past Medical History:  Diagnosis Date  . Anticoagulated on warfarin   . Arthritis   . Chronic atrial fibrillation ([place])    SINCE 1997  . Dysrhythmia   . Goiter    multinodular  . Heart murmur   . History of hyperthyroidism    2003--  PTU [place]  . HTN (hypertension)    unspecified  . Hypercholesterolemia   . Hyperlipidemia   . Hyperthyroidism    H/O  . Pulmonary hypertension, mild ([place])   . Rheumatic heart disease    CARDIOLOGIST-- DR Marcello Moores [facility]  . S/P MVR (mitral valve replacement)    1997  [facility]--  SECONDARY TO SEVERE MV STENOSIS    Past Surgical History:  Procedure Laterality Date  . ANTERIOR CERVICAL DECOMP/DISCECTOMY FUSION  12-21-2001   C5 --  C6  . BREAST BIOPSY Left 11/03/2016   Procedure: BREAST BIOPSY WITH NEEDLE LOCALIZATION;  Surgeon: Robert Bellow, MD;  Location: [facility] ORS;  Service: General;  Laterality: Left;  . BREAST EXCISIONAL BIOPSY Left 11/03/2016   excision of calcs. Path pending  . CARDIAC CATHETERIZATION  12-28-2006  [facility]   NORMAL CORONARY ARTERIES  . CARPAL TUNNEL RELEASE Right 01-20-2007  . COLONOSCOPY  2015  . ERCP  08/15/2011   Procedure: ENDOSCOPIC RETROGRADE CHOLANGIOPANCREATOGRAPHY (ERCP);  Surgeon: Jeryl Columbia, MD;  Location: [facility] ENDOSCOPY;  Service: Endoscopy;  Laterality: N/A;  . ERCP     MULTIPLE--  INCLUDING STENTS, SPHINTEROTOMY'S  STONE REMOVAL   . FEMUR IM NAIL Right 04/26/2014   Procedure: INTRAMEDULLARY (IM) NAIL FEMORAL;  Surgeon: Alta Corning, MD;  Location: [place];  Service: Orthopedics;  Laterality: Right;  . I&D EXTREMITY Right 02/07/2013   Procedure: IRRIGATION  AND DEBRIDEMENT OF RIGHT LOWER LEG WITH PLACEMENT OF ACELL AND WOULND VAC;  Surgeon: Theodoro Kos, DO;  Location: [place];  Service: Plastics;  Laterality: Right;  . LAPAROSCOPIC CHOLECYSTECTOMY  04-08-2002  . MITRAL VALVE REPLACEMENT  1997   [facility]  . ORIF ANKLE FRACTURE Right 11/05/2012   Procedure: OPEN REDUCTION INTERNAL FIXATION (ORIF) ANKLE FRACTURE only operating on right;  Surgeon: Alta Corning, MD;  Location: [place];  Service: Orthopedics;  Laterality: Right;  . ORIF RIGHT ULNAR FX W/ ILIAC CREST BONE GRAFT  10-06-2008  . ORIF WRIST FRACTURE Right 04/26/2014   Procedure: OPEN REDUCTION INTERNAL FIXATION (ORIF) WRIST FRACTURE;  Surgeon: Alta Corning, MD;  Location: [place];  Service: Orthopedics;  Laterality: Right;  . TRANSTHORACIC ECHOCARDIOGRAM  06-29-2009  dr wall   normal lvsf/ ef 55-60%/  normal bileaflet mechanical mvr/ moderated dilated la/ moderate tr  . WRIST ARTHROSCOPY Right 12-10-2007   debridement and ulnar shortening osteotomy w/ plate    Family History  Problem Relation Age of Onset  . Hypothyroidism Sister   . Gallbladder disease Sister   . Coronary artery disease Unknown   . Diabetes Mother   . Hypertension Mother   . Heart disease Father   . Stroke Father   . Breast cancer Neg Hx  Social History Social History  Substance Use Topics  . Smoking status: Never Smoker  . Smokeless tobacco: Never Used  . Alcohol use No    No Known Allergies  Current Outpatient Prescriptions  Medication Sig Dispense Refill  . alendronate (FOSAMAX) 35 MG tablet Take 1 tablet (35 mg total) by mouth every 7 (seven) days. Take with a full glass of water on an empty stomach. (Patient taking differently: Take 35 mg by mouth every Tuesday. Take with a full glass of water on an empty stomach.) 12 tablet 3  . amitriptyline (ELAVIL) 50 MG tablet Take 50 mg by mouth at bedtime as needed for sleep.     Marland Kitchen aspirin EC 81 MG tablet Take 1 tablet (81 mg total) by mouth  daily. 90 tablet 3  . atorvastatin (LIPITOR) 40 MG tablet Take 1 tablet (40 mg total) by mouth every evening. 90 tablet 3  . calcium carbonate (OSCAL) 1500 (600 Ca) MG TABS tablet Take 1 tablet by mouth daily.     . Cholecalciferol (VITAMIN D3) 50000 units CAPS Take 50,000 Units by mouth once a week. For 12 weeks, then switch to OTC Vitamin D3 2,000 unit daily (Patient taking differently: Take 50,000 Units by mouth every Wednesday. For 12 weeks, then switch to OTC Vitamin D3 2,000 unit daily) 12 capsule 0  . cycloSPORINE (RESTASIS) 0.05 % ophthalmic emulsion Place 1 drop into both eyes 2 (two) times daily as needed (dry eyes).    Marland Kitchen dicyclomine (BENTYL) 10 MG capsule Take 1 capsule (10 mg total) by mouth 4 (four) times daily -  before meals and at bedtime. (Patient taking differently: Take 10 mg by mouth at bedtime. ) 90 capsule 3  . fluticasone (FLONASE) 50 MCG/ACT nasal spray Place 2 sprays into both nostrils daily. (Patient taking differently: Place 2 sprays into both nostrils daily as needed for allergies or rhinitis. ) 16 g 11  . ibuprofen (ADVIL,MOTRIN) 200 MG tablet Take 200 mg by mouth 2 (two) times daily as needed for headache or moderate pain.    Marland Kitchen lisinopril (PRINIVIL,ZESTRIL) 40 MG tablet Take 1 tablet (40 mg total) by mouth daily. (Patient taking differently: Take 40 mg by mouth every evening. ) 90 tablet 3  . loratadine (CLARITIN) 10 MG tablet Take 1 tablet (10 mg total) by mouth daily. 30 tablet 11  . metoprolol succinate (TOPROL-XL) 100 MG 24 hr tablet Take 1 tablet (100 mg total) by mouth daily. Take with or immediately following a meal. (Patient taking differently: Take 100 mg by mouth every morning. Take with or immediately following a meal.) 90 tablet 3  . Multiple Vitamins-Minerals (CENTRUM SILVER ADULT 50+ PO) Take 1 tablet by mouth daily.    Marland Kitchen warfarin (COUMADIN) 5 MG tablet Take 1 tablet by mouth except 2.5 mg on Tuesdays and Saturdays 95 tablet 1   No current  facility-administered medications for this visit.     Review of Systems Review of Systems  Constitutional: Negative.   Respiratory: Negative.   Cardiovascular: Negative.     Blood pressure (!) 144/83, pulse 89, resp. rate 15, height 5\' 3"  (1.6 m), weight 190 lb (86.2 kg).  Physical Exam Physical Exam  Constitutional: She is oriented to person, place, and time. She appears well-developed and well-nourished.  Pulmonary/Chest:  1.5 x 3 cm bruising noted left breast  Neurological: She is alert and oriented to person, place, and time.  Skin: Skin is warm and dry.  Psychiatric: She has a normal mood and affect.  Data Reviewed DIAGNOSIS:  A. LEFT BREAST, UPPER OUTER QUADRANT; NEEDLE LOCALIZED EXCISION:  - COLUMNAR CELL LESION WITH MICROCALCIFICATIONS.  - PSEUDO-ANGIOMATOUS STROMAL HYPERPLASIA.  - CHRONIC INFLAMMATION.  - NEGATIVE FOR ATYPIA AND MALIGNANCY.    Assessment    Doing well post biopsy.    Plan    Arrangements will be made for screening mammograms in one year.    HPI, Physical Exam, Assessment and Plan have been scribed under the direction and in the presence of Robert Bellow, MD  Concepcion Living, LPN  I have completed the exam and reviewed the above documentation for accuracy and completeness.  I agree with the above.  Haematologist has been used and any errors in dictation or transcription are unintentional.  Hervey Ard, M.D., F.A.C.S.   Robert Bellow 11/11/2016, 5:47 PM

## 2016-11-19 ENCOUNTER — Ambulatory Visit (INDEPENDENT_AMBULATORY_CARE_PROVIDER_SITE_OTHER): Payer: Medicare Other

## 2016-11-19 DIAGNOSIS — I482 Chronic atrial fibrillation, unspecified: Secondary | ICD-10-CM

## 2016-11-19 DIAGNOSIS — I059 Rheumatic mitral valve disease, unspecified: Secondary | ICD-10-CM | POA: Diagnosis not present

## 2016-11-19 DIAGNOSIS — Z9889 Other specified postprocedural states: Secondary | ICD-10-CM

## 2016-11-19 DIAGNOSIS — I4891 Unspecified atrial fibrillation: Secondary | ICD-10-CM

## 2016-11-19 DIAGNOSIS — Z5181 Encounter for therapeutic drug level monitoring: Secondary | ICD-10-CM

## 2016-11-19 LAB — POCT INR: INR: 3.5

## 2016-12-02 ENCOUNTER — Ambulatory Visit (INDEPENDENT_AMBULATORY_CARE_PROVIDER_SITE_OTHER): Payer: Medicare Other | Admitting: Family Medicine

## 2016-12-02 ENCOUNTER — Encounter: Payer: Self-pay | Admitting: Family Medicine

## 2016-12-02 ENCOUNTER — Telehealth: Payer: Self-pay | Admitting: Family Medicine

## 2016-12-02 ENCOUNTER — Other Ambulatory Visit: Payer: Self-pay | Admitting: Family Medicine

## 2016-12-02 VITALS — BP 138/80 | HR 80 | Temp 97.9°F | Resp 16 | Ht 63.0 in | Wt 190.0 lb

## 2016-12-02 DIAGNOSIS — E559 Vitamin D deficiency, unspecified: Secondary | ICD-10-CM | POA: Insufficient documentation

## 2016-12-02 DIAGNOSIS — E042 Nontoxic multinodular goiter: Secondary | ICD-10-CM | POA: Diagnosis not present

## 2016-12-02 DIAGNOSIS — I272 Pulmonary hypertension, unspecified: Secondary | ICD-10-CM | POA: Diagnosis not present

## 2016-12-02 DIAGNOSIS — M8580 Other specified disorders of bone density and structure, unspecified site: Secondary | ICD-10-CM

## 2016-12-02 DIAGNOSIS — J3089 Other allergic rhinitis: Secondary | ICD-10-CM

## 2016-12-02 DIAGNOSIS — I10 Essential (primary) hypertension: Secondary | ICD-10-CM | POA: Diagnosis not present

## 2016-12-02 DIAGNOSIS — J309 Allergic rhinitis, unspecified: Secondary | ICD-10-CM | POA: Insufficient documentation

## 2016-12-02 LAB — T4, FREE: Free T4: 1.2 ng/dL (ref 0.8–1.8)

## 2016-12-02 LAB — TSH: TSH: 0.71 mIU/L

## 2016-12-02 MED ORDER — CETIRIZINE HCL 10 MG PO TABS: 10.0000 mg | ORAL_TABLET | Freq: Every day | ORAL | 11 refills | Status: DC

## 2016-12-02 MED ORDER — IPRATROPIUM BROMIDE 0.06 % NA SOLN: 2.0000 | Freq: Four times a day (QID) | NASAL | 2 refills | Status: DC

## 2016-12-02 NOTE — Assessment & Plan Note (Signed)
Again mildly elevated initial BP, repeat manual check improved. - Home BP readings improved  Complication with AFib, MVR, chronic anticoagulation Followed by [facility] Cardiology   Plan:  1. Continue current BP regimen - Lisinopril 20mg  daily, Metoprolol-XL 100mg  daily 2. Encourage improved lifestyle - low sodium diet, regular exercise 3. Continue monitor BP outside office, bring readings to next visit, if persistently >140/90 or new symptoms notify office sooner 4. Follow-up - 6 months for Annual phys + labs

## 2016-12-02 NOTE — Assessment & Plan Note (Signed)
Vitamin D deficient, last level 12, in 05/2016 S/p Vitamin D3 50k weekly x 3 months, now on maintenance 2k daily Check vitamin D again today now 6 months later Follow-up result, may need 5k daily for another 3 months, then back to 2k daily

## 2016-12-02 NOTE — Patient Instructions (Addendum)
Thank you for coming to the clinic today.   1. Keep up the good work! 2. No changes to medicine 3. Keep taking Vitamin D3 2,000 unit daily - will check Vitamin D today and let you know if need to increase. 4. Check Thyroid lab today - will let you know about result and if need to follow-up with Endocrine Dr Fletcher Anon [facility])  [facility] [place], [place] [address] Phone: [phone] Call to schedule for October or November 2018, now that order has been placed.  You will be due for FASTING BLOOD WORK (no food or drink after midnight before, only water or coffee without cream/sugar on the morning of)  - Please go ahead and schedule a "Lab Only" visit in the morning at the clinic for lab draw in 6 months after 06/03/17  - Make sure Lab Only appointment is at least 1-2 weeks before your next appointment, so that results will be available  For Lab Results, once available within 2-3 days of blood draw, you can can log in to MyChart online to view your results and a brief explanation. Also, we can discuss results at next follow-up visit.  Please schedule a Follow-up Appointment to: Return in about 6 months (around 06/03/2017) for Annual Physical.  If you have any other questions or concerns, please feel free to call the clinic or send a message through [place]. You may also schedule an earlier appointment if necessary.  Nobie Putnam, DO [place]

## 2016-12-02 NOTE — Assessment & Plan Note (Signed)
Stable history of mild pulm HTN Followed by Cardiology, with MVR complication

## 2016-12-02 NOTE — Assessment & Plan Note (Signed)
Stable chronic problem Some mild increased congestion despite Flonase, Loratadine Advised may switch to Cetirizine instead and continue Flonase 2 sprays daily Add Atrovent PRN for nasal decongestant

## 2016-12-02 NOTE — Assessment & Plan Note (Addendum)
Stable osteopenia without complication or new fracture - Dx by DEXA 09/2015, now on bisphosphonate prophylaxis dose weekly since 05/2016 - On calcium supplement and vitamin D therapy (low vit D as well) - Concern high risk with prior hip and ankle fractures in past  Plan: 1. Re-check Vitamin D today - follow-up if need to inc back to therapeutic may do 5k daily instead, and advised future maintenance dosing Vitamin D3 2,000iu daily 2. Continue current Alendronate bisphosphonate 35mg  weekly (osteoporosis prophylaxis dose) - started 05/2016, anticipate trial for 1-3 years 3. Ordered DEXA to be done Nov/Dec 2018 about 1 year on bisphosphonate to follow-up 4. Follow-up q 6 months

## 2016-12-02 NOTE — Progress Notes (Signed)
Subjective:    Patient ID: [name], female    DOB: [birth year], 61 y.o.   MRN: [id number]  [name] is a 61 y.o. female presenting on 12/02/2016 for Hypertension and osteopenia   HPI   CHRONIC HTN / Pulmonary HTN (Mild) / S/p MVR on Chronic Anticoagulation Reports doing well. Still checking her BP at home with good results, reported avg 130-140s/70-80s Followed by Cardiology [facility] for routine Coumadin checks, goal INR 3 to 3.5 with mechanical valve / AFib Current Meds - Lisinopril 20mg , Metoprolol-XL 100mg  daily   Reports good compliance, took meds today. Tolerating well, w/o complaints - Lifestyle: Unchanged, still limited exercise (prior history of ankle fractures in 2014 and femur fracture in 2 places in 2016) does some walking (limited by some R leg swelling) Denies CP, dyspnea, HA, edema, dizziness / lightheadedness  Osteopenia / Vitamin D Deficiency: - Last visit with me 05/2016, discussed same problem, last DEXA was 09/2015 dx osteopenia, I started her in 05/2016 with treatment on Fosamax 35mg  weekly for prevention / osteopenia, see prior notes for background information. - Today patient reports doing well on Fosamax, tolerating well without complaints - No new fracture or injury or concerns. Prior fracture concerns with hip and ankles in past. - She is interested in repeat DEXA, agrees to wait 1 year, from start treatment - She is treated with Calcium supplement daily - Additionally last time had lab test Vitamin D low at 12 - she was started on Vitamin D3 50,000 weekly x 3 months then reduced to maintenance dose of Vitamin D3 2,000 daily - Due for Vitamin D testing today  Hyperthyroidism / H/o Multinodular Goiter - Previously followed by Endocrinology [facility], Dr Fletcher Anon), treated initially with medication but has been off now for about 3 years, admits she should probably schedule a follow-up visit - Agrees to re-check thyroid labs today  along  Chronic Seasonal and Environmental Allergies / Alleric Rhinosinusitis - Reports doing well currently, has some congestion but improved. Taking Loratadine and Flonase, does not need refills today.  Review of Systems   Per HPI unless specifically indicated above     Objective:    BP 138/80 (BP Location: Left Arm, Cuff Size: Normal)   Pulse 80   Temp 97.9 F (36.6 C) (Oral)   Resp 16   Ht 5\' 3"  (1.6 m)   Wt 190 lb (86.2 kg)   BMI 33.66 kg/m   Wt Readings from Last 3 Encounters:  12/02/16 190 lb (86.2 kg)  11/10/16 190 lb (86.2 kg)  11/03/16 193 lb (87.5 kg)    Physical Exam  Constitutional: She is oriented to person, place, and time. She appears well-developed and well-nourished. No distress.  Well-appearing, comfortable, cooperative  HENT:  Head: Normocephalic and atraumatic.  Mouth/Throat: Oropharynx is clear and moist.  Frontal / maxillary sinuses non-tender. Nares patent without purulence or edema. Bilateral TMs clear without erythema, effusion or bulging. Oropharynx clear without erythema, exudates, edema or asymmetry.  Eyes: Conjunctivae and EOM are normal. Pupils are equal, round, and reactive to light.  Neck: Normal range of motion. Neck supple. No thyromegaly present.  Cardiovascular: Regular rhythm and intact distal pulses.   No murmur heard. Irregularly irregular rhythm. Mechanical heart valve clicking.  Pulmonary/Chest: Effort normal and breath sounds normal. No respiratory distress. She has no wheezes. She has no rales.  Musculoskeletal: Normal range of motion. She exhibits no edema or tenderness.  Lymphadenopathy:    She has no cervical adenopathy.  Neurological: She is alert and oriented to person, place, and time.  Skin: Skin is warm and dry. No rash noted. She is not diaphoretic.  Psychiatric: She has a normal mood and affect. Her behavior is normal.  Well groomed, good eye contact, normal speech and thoughts  Nursing note and vitals reviewed.    Recent Results (from the past 2160 hour(s))  POCT INR     Status: None   Collection Time: 09/10/16 10:43 AM  Result Value Ref Range   INR 4.1   POCT INR     Status: None   Collection Time: 10/01/16  8:29 AM  Result Value Ref Range   INR 3.7   CBC     Status: Abnormal   Collection Time: 10/01/16  9:19 AM  Result Value Ref Range   WBC 5.1 3.4 - 10.8 x10E3/uL   RBC 4.40 3.77 - 5.28 x10E6/uL   Hemoglobin 12.0 11.1 - 15.9 g/dL   Hematocrit 36.7 34.0 - 46.6 %   MCV 83 79 - 97 fL   MCH 27.3 26.6 - 33.0 pg   MCHC 32.7 31.5 - 35.7 g/dL   RDW 15.8 (H) 12.3 - 15.4 %   Platelets 170 150 - 379 x10E3/uL  Hepatic function panel     Status: None   Collection Time: 10/01/16  9:19 AM  Result Value Ref Range   Total Protein 7.1 6.0 - 8.5 g/dL   Albumin 3.8 3.6 - 4.8 g/dL   Bilirubin Total 0.7 0.0 - 1.2 mg/dL   Bilirubin, Direct 0.17 0.00 - 0.40 mg/dL   Alkaline Phosphatase 111 39 - 117 IU/L   AST 28 0 - 40 IU/L   ALT 21 0 - 32 IU/L  Lipid panel     Status: None   Collection Time: 10/01/16  9:19 AM  Result Value Ref Range   Cholesterol, Total 157 100 - 199 mg/dL   Triglycerides 111 0 - 149 mg/dL   HDL 46 >39 mg/dL   VLDL Cholesterol Cal 22 5 - 40 mg/dL   LDL Calculated 89 0 - 99 mg/dL   Chol/HDL Ratio 3.4 0.0 - 4.4 ratio    Comment:                                   T. Chol/HDL Ratio                                             Men  Women                               1/2 Avg.Risk  3.4    3.3                                   Avg.Risk  5.0    4.4                                2X Avg.Risk  9.6    7.1  3X Avg.Risk 23.4   11.0   POCT INR     Status: None   Collection Time: 10/29/16  8:33 AM  Result Value Ref Range   INR 2.5   Surgical pathology     Status: None   Collection Time: 11/03/16 12:28 PM  Result Value Ref Range   SURGICAL PATHOLOGY      Surgical Pathology CASE: [id number] PATIENT: [facility] Surgical Pathology  Report     SPECIMEN SUBMITTED: A. Breast, left, UOQ; needle localization  CLINICAL HISTORY: None provided  PRE-OPERATIVE DIAGNOSIS: Micro calcification  POST-OPERATIVE DIAGNOSIS: Same as pre-op     DIAGNOSIS: A. LEFT BREAST, UPPER OUTER QUADRANT; NEEDLE LOCALIZED EXCISION: - COLUMNAR CELL LESION WITH MICROCALCIFICATIONS. - PSEUDO-ANGIOMATOUS STROMAL HYPERPLASIA. - CHRONIC INFLAMMATION. - NEGATIVE FOR ATYPIA AND MALIGNANCY.   GROSS DESCRIPTION:  A. Labeled: left breast needle localization upper outer quadrant  Time in fixative:   12:50 PM  Cold Ischemic Time: 11 minutes  Total formalin fixation 8 hours  Type of specimen: excision  Location of specimen: left  Size of specimen: 6.0 (medial-lateral) by 4.6 (superior-inferior) by 1.0 (anterior-posterior) centimeter  Skin: none present  Direction of compression: anterior-posterior  Needle localization: yes, 1   Orientation of specimen: caudal, medial and lateral metallic markers Superior = blue Inferior = green Medial = yellow Lateral = orange Posterior = black Anterior/Superficial = red  Plane of sectioning: medial-lateral  Biopsy site: not present  Presence/absence of discrete mass: no mass grossly identified  Number of discrete masses: no mass identified; focal calcification on radiograph  Size(s) of mass(es): no mass identified  Distance between masses/clips: not applicable  Description of remainder of tissue: yellow lobulated fatty focally fibrous  Other remarkable features: none noted  Tissue submitted for special investigation: not applicable  Block summary: 1-4-lateral to medial respectively in area of calcification (2-3-entire area with calcification from radiograph) 5-7- remaining fibrous area from lateral towards medial respectively   Final Diagnosis performed by Delorse Lek, MD.  Electronically signed 11/04/2016 3:50:03PM    The electronic  signature indicates that the  named Attending Pathologist has evaluated the specimen  Technical component performed at [facility], [address] [place], [place] [address] Lab: [phone] Dir: Darrick Penna. Evette Doffing, MD  Professional component performed at [facility], [facility], [place], [place], [place] [address] Lab: [phone] Dir: Dellia Nims. Rubinas, MD    POCT INR     Status: None   Collection Time: 11/19/16  8:15 AM  Result Value Ref Range   INR 3.5        Assessment & Plan:   Problem List Items Addressed This Visit    Vitamin D deficiency    Vitamin D deficient, last level 12, in 05/2016 S/p Vitamin D3 50k weekly x 3 months, now on maintenance 2k daily Check vitamin D again today now 6 months later Follow-up result, may need 5k daily for another 3 months, then back to 2k daily      Relevant Orders   VITAMIN D 25 Hydroxy (Vit-D Deficiency, Fractures)   Pulmonary hypertension (HCC)    Stable history of mild pulm HTN Followed by Cardiology, with MVR complication      Osteopenia determined by x-ray - Primary    Stable osteopenia without complication or new fracture - Dx by DEXA 09/2015, now on bisphosphonate prophylaxis dose weekly since 05/2016 - On calcium supplement and vitamin D therapy (low vit D as well) - Concern high risk with prior hip and ankle fractures in  past  Plan: 1. Re-check Vitamin D today - follow-up if need to inc back to therapeutic may do 5k daily instead, and advised future maintenance dosing Vitamin D3 2,000iu daily 2. Continue current Alendronate bisphosphonate 35mg  weekly (osteoporosis prophylaxis dose) - started 05/2016, anticipate trial for 1-3 years 3. Ordered DEXA to be done Nov/Dec 2018 about 1 year on bisphosphonate to follow-up 4. Follow-up q 6 months      Relevant Orders   VITAMIN D 25 Hydroxy (Vit-D Deficiency, Fractures)   DG Bone Density   GOITER, MULTINODULAR    Clinically stable, but >1 year since last thyroid test, no longer followed  by Endocrine in [facility] Check TSH, Free T4 today labs Remains off levothyroxine Based on results or even regardless, advised patient that she likely needed to check back in with prior Endocrinology Dr Fletcher Anon for follow-up      Relevant Orders   TSH   T4, free   Essential hypertension    Again mildly elevated initial BP, repeat manual check improved. - Home BP readings improved  Complication with AFib, MVR, chronic anticoagulation Followed by [facility] Cardiology   Plan:  1. Continue current BP regimen - Lisinopril 20mg  daily, Metoprolol-XL 100mg  daily 2. Encourage improved lifestyle - low sodium diet, regular exercise 3. Continue monitor BP outside office, bring readings to next visit, if persistently >140/90 or new symptoms notify office sooner 4. Follow-up - 6 months for Annual phys + labs      Allergic rhinitis    Stable chronic problem Some mild increased congestion despite Flonase, Loratadine Advised may switch to Cetirizine instead and continue Flonase 2 sprays daily Add Atrovent PRN for nasal decongestant      Relevant Medications   cetirizine (ZYRTEC) 10 MG tablet   ipratropium (ATROVENT) 0.06 % nasal spray      Meds ordered this encounter  Medications  .   . cetirizine (ZYRTEC) 10 MG tablet    Sig: Take 1 tablet (10 mg total) by mouth daily.    Dispense:  30 tablet    Refill:  11  . ipratropium (ATROVENT) 0.06 % nasal spray    Sig: Place 2 sprays into both nostrils 4 (four) times daily. For up to 5-7 days then stop.    Dispense:  15 mL    Refill:  2     Follow up plan: Return in about 6 months (around 06/03/2017) for Annual Physical.  Nobie Putnam, DO [facility] 12/02/2016, 1:17 PM

## 2016-12-02 NOTE — Telephone Encounter (Signed)
error 

## 2016-12-02 NOTE — Assessment & Plan Note (Signed)
Clinically stable, but >1 year since last thyroid test, no longer followed by Endocrine in [facility] Check TSH, Free T4 today labs Remains off levothyroxine Based on results or even regardless, advised patient that she likely needed to check back in with prior Endocrinology Dr Fletcher Anon for follow-up

## 2016-12-03 LAB — VITAMIN D 25 HYDROXY (VIT D DEFICIENCY, FRACTURES): Vit D, 25-Hydroxy: 51 ng/mL (ref 30–100)

## 2016-12-04 ENCOUNTER — Other Ambulatory Visit: Payer: Self-pay | Admitting: Family Medicine

## 2016-12-04 DIAGNOSIS — Z0000 Encounter for general adult medical examination without abnormal findings: Secondary | ICD-10-CM

## 2016-12-04 DIAGNOSIS — R799 Abnormal finding of blood chemistry, unspecified: Secondary | ICD-10-CM

## 2016-12-04 DIAGNOSIS — R7309 Other abnormal glucose: Secondary | ICD-10-CM

## 2016-12-04 DIAGNOSIS — I10 Essential (primary) hypertension: Secondary | ICD-10-CM

## 2016-12-04 DIAGNOSIS — E559 Vitamin D deficiency, unspecified: Secondary | ICD-10-CM

## 2016-12-04 DIAGNOSIS — E042 Nontoxic multinodular goiter: Secondary | ICD-10-CM

## 2016-12-04 DIAGNOSIS — E7800 Pure hypercholesterolemia, unspecified: Secondary | ICD-10-CM

## 2016-12-04 DIAGNOSIS — Z862 Personal history of diseases of the blood and blood-forming organs and certain disorders involving the immune mechanism: Secondary | ICD-10-CM

## 2016-12-17 ENCOUNTER — Ambulatory Visit (INDEPENDENT_AMBULATORY_CARE_PROVIDER_SITE_OTHER): Payer: Medicare Other

## 2016-12-17 DIAGNOSIS — I482 Chronic atrial fibrillation, unspecified: Secondary | ICD-10-CM

## 2016-12-17 DIAGNOSIS — Z9889 Other specified postprocedural states: Secondary | ICD-10-CM

## 2016-12-17 DIAGNOSIS — I059 Rheumatic mitral valve disease, unspecified: Secondary | ICD-10-CM | POA: Diagnosis not present

## 2016-12-17 DIAGNOSIS — Z5181 Encounter for therapeutic drug level monitoring: Secondary | ICD-10-CM | POA: Diagnosis not present

## 2016-12-17 DIAGNOSIS — I4891 Unspecified atrial fibrillation: Secondary | ICD-10-CM | POA: Diagnosis not present

## 2016-12-17 LAB — POCT INR: INR: 4.6

## 2016-12-31 ENCOUNTER — Ambulatory Visit (INDEPENDENT_AMBULATORY_CARE_PROVIDER_SITE_OTHER): Payer: Medicare Other | Admitting: *Deleted

## 2016-12-31 DIAGNOSIS — I4891 Unspecified atrial fibrillation: Secondary | ICD-10-CM

## 2016-12-31 DIAGNOSIS — I482 Chronic atrial fibrillation, unspecified: Secondary | ICD-10-CM

## 2016-12-31 DIAGNOSIS — Z9889 Other specified postprocedural states: Secondary | ICD-10-CM | POA: Diagnosis not present

## 2016-12-31 DIAGNOSIS — Z5181 Encounter for therapeutic drug level monitoring: Secondary | ICD-10-CM | POA: Diagnosis not present

## 2016-12-31 DIAGNOSIS — I059 Rheumatic mitral valve disease, unspecified: Secondary | ICD-10-CM

## 2016-12-31 LAB — POCT INR: INR: 5.4

## 2017-01-14 ENCOUNTER — Ambulatory Visit (INDEPENDENT_AMBULATORY_CARE_PROVIDER_SITE_OTHER): Payer: Medicare Other

## 2017-01-14 DIAGNOSIS — I482 Chronic atrial fibrillation, unspecified: Secondary | ICD-10-CM

## 2017-01-14 DIAGNOSIS — Z9889 Other specified postprocedural states: Secondary | ICD-10-CM | POA: Diagnosis not present

## 2017-01-14 DIAGNOSIS — I059 Rheumatic mitral valve disease, unspecified: Secondary | ICD-10-CM

## 2017-01-14 DIAGNOSIS — Z5181 Encounter for therapeutic drug level monitoring: Secondary | ICD-10-CM | POA: Diagnosis not present

## 2017-01-14 DIAGNOSIS — I4891 Unspecified atrial fibrillation: Secondary | ICD-10-CM | POA: Diagnosis not present

## 2017-01-14 LAB — POCT INR: INR: 3.4

## 2017-01-23 ENCOUNTER — Emergency Department: Payer: Medicare Other

## 2017-01-23 ENCOUNTER — Emergency Department
Admission: EM | Admit: 2017-01-23 | Discharge: 2017-01-23 | Disposition: A | Discharge status: Home / Self Care | Payer: Medicare Other | Attending: Emergency Medicine | Admitting: Emergency Medicine

## 2017-01-23 ENCOUNTER — Encounter: Payer: Self-pay | Admitting: Intensive Care

## 2017-01-23 DIAGNOSIS — I480 Paroxysmal atrial fibrillation: Secondary | ICD-10-CM | POA: Insufficient documentation

## 2017-01-23 DIAGNOSIS — I10 Essential (primary) hypertension: Secondary | ICD-10-CM | POA: Insufficient documentation

## 2017-01-23 DIAGNOSIS — R202 Paresthesia of skin: Secondary | ICD-10-CM

## 2017-01-23 DIAGNOSIS — R200 Anesthesia of skin: Secondary | ICD-10-CM | POA: Diagnosis present

## 2017-01-23 DIAGNOSIS — I4891 Unspecified atrial fibrillation: Secondary | ICD-10-CM

## 2017-01-23 LAB — COMPREHENSIVE METABOLIC PANEL
ALBUMIN: 3.9 g/dL (ref 3.5–5.0)
ALK PHOS: 106 U/L (ref 38–126)
ALT: 27 U/L (ref 14–54)
AST: 42 U/L — ABNORMAL HIGH (ref 15–41)
Anion gap: 9 (ref 5–15)
BUN: 12 mg/dL (ref 6–20)
CALCIUM: 9.4 mg/dL (ref 8.9–10.3)
CO2: 27 mmol/L (ref 22–32)
CREATININE: 0.97 mg/dL (ref 0.44–1.00)
Chloride: 102 mmol/L (ref 101–111)
GFR calc Af Amer: >60 mL/min (ref >60)
GFR calc non Af Amer: >60 mL/min (ref >60)
GLUCOSE: 115 mg/dL — AB (ref 65–99)
Potassium: 3.5 mmol/L (ref 3.5–5.1)
SODIUM: 138 mmol/L (ref 135–145)
Total Bilirubin: 1.3 mg/dL — ABNORMAL HIGH (ref 0.3–1.2)
Total Protein: 8.7 g/dL — ABNORMAL HIGH (ref 6.5–8.1)

## 2017-01-23 LAB — CBC WITH DIFFERENTIAL/PLATELET
BASOS ABS: 0 10*3/uL (ref 0–0.1)
Basophils Relative: 1 %
Eosinophils Absolute: 0 10*3/uL (ref 0–0.7)
Eosinophils Relative: 1 %
HEMATOCRIT: 40.9 % (ref 35.0–47.0)
Hemoglobin: 13.3 g/dL (ref 12.0–16.0)
LYMPHS ABS: 0.9 10*3/uL — AB (ref 1.0–3.6)
LYMPHS PCT: 16 %
MCH: 27.6 pg (ref 26.0–34.0)
MCHC: 32.5 g/dL (ref 32.0–36.0)
MCV: 84.8 fL (ref 80.0–100.0)
Monocytes Absolute: 0.6 10*3/uL (ref 0.2–0.9)
Monocytes Relative: 10 %
NEUTROS ABS: 4.3 10*3/uL (ref 1.4–6.5)
Neutrophils Relative %: 72 %
Platelets: 156 10*3/uL (ref 150–440)
RBC: 4.83 MIL/uL (ref 3.80–5.20)
RDW: 14.8 % — ABNORMAL HIGH (ref 11.5–14.5)
WBC: 6 10*3/uL (ref 3.6–11.0)

## 2017-01-23 LAB — PROTIME-INR
INR: 1.74
Prothrombin Time: 20.6 seconds — ABNORMAL HIGH (ref 11.4–15.2)

## 2017-01-23 LAB — APTT: APTT: 35 s (ref 24–36)

## 2017-01-23 MED ORDER — METOPROLOL TARTRATE 25 MG PO TABS
25.0000 mg | ORAL_TABLET | Freq: Once | ORAL | Status: AC
  Administered 2017-01-23: 25 mg via ORAL
  Filled 2017-01-23: qty 1

## 2017-01-23 MED ORDER — METOPROLOL TARTRATE 5 MG/5ML IV SOLN
5.0000 mg | Freq: Once | INTRAVENOUS | Status: AC
  Administered 2017-01-23: 5 mg via INTRAVENOUS

## 2017-01-23 MED ORDER — METOPROLOL TARTRATE 5 MG/5ML IV SOLN
INTRAVENOUS | Status: AC
Start: 2017-01-23 — End: 2017-01-23
  Administered 2017-01-23: 5 mg via INTRAVENOUS
  Filled 2017-01-23: qty 5

## 2017-01-23 MED ORDER — VERAPAMIL HCL 2.5 MG/ML IV SOLN
5.0000 mg | Freq: Once | INTRAVENOUS | Status: DC
  Filled 2017-01-23: qty 2

## 2017-01-23 MED ORDER — HYDROCODONE-ACETAMINOPHEN 5-325 MG PO TABS: 1.0000 | ORAL_TABLET | ORAL | 0 refills | Status: DC | PRN

## 2017-01-23 MED ORDER — SODIUM CHLORIDE 0.9 % IV SOLN
Freq: Once | INTRAVENOUS | Status: AC
  Administered 2017-01-23: 1 mL via INTRAVENOUS

## 2017-01-23 NOTE — ED Provider Notes (Signed)
[facility] Emergency Department Provider Note       Time seen: ----------------------------------------- 2:56 PM on 01/23/2017 -----------------------------------------     I have reviewed the triage vital signs and the nursing notes.   HISTORY   Chief Complaint Numbness    HPI [name] is a 61 y.o. female who presents to the ED for tingling in her left hand, mouth and tongue. Patient had a dental extraction yesterday while on Coumadin and reportedly did not have significant bleeding. She noted within the last 10 minutes she was having the numbness and tingling. Currently she takes Coumadin for atrial fibrillation. She denies fevers, chills, chest pain or trouble breathing.   Past Medical History:  Diagnosis Date  . Anticoagulated on warfarin   . Arthritis   . Chronic atrial fibrillation ([place])    SINCE 1997  . Dysrhythmia   . Goiter    multinodular  . Heart murmur   . History of hyperthyroidism    2003--  PTU [place]  . HTN (hypertension)    unspecified  . Hypercholesterolemia   . Hyperlipidemia   . Hyperthyroidism    H/O  . Pulmonary hypertension, mild ([place])   . Rheumatic heart disease    CARDIOLOGIST-- DR Marcello Moores [place]  . S/P MVR (mitral valve replacement)    [place]--  SECONDARY TO SEVERE MV STENOSIS    Patient Active Problem List   Diagnosis Date Noted  . Allergic rhinitis 12/02/2016  . Vitamin D deficiency 12/02/2016  . S/P MVR (mitral valve replacement) 06/03/2016  . Osteopenia determined by x-ray 06/03/2016  . Environmental and seasonal allergies 06/03/2016  . RUQ abdominal pain 03/13/2016  . Breast microcalcifications 10/15/2015  . Piriformis syndrome 10/02/2015  . History of femur fracture 04/26/2014  . Encounter for therapeutic drug monitoring 07/19/2013  . Choledocholithiasis 08/14/2011  . Anticoagulated on Coumadin 08/14/2011  . Mitral valve disease 08/15/2010  . MITRAL VALVE REPLACEMENT, HX OF 04/24/2009  .  History of hyperthyroidism 04/19/2009  . Essential hypertension 10/17/2008  . Pulmonary hypertension ([place]) 10/17/2008  . RHEUMATIC HEART DISEASE, HX OF 10/17/2008  . GOITER, MULTINODULAR 10/12/2007  . HYPERCHOLESTEROLEMIA 10/12/2007  . Chronic atrial fibrillation ([place]) 10/12/2007  . GERD 10/12/2007    Past Surgical History:  Procedure Laterality Date  . ANTERIOR CERVICAL DECOMP/DISCECTOMY FUSION  12-21-2001   C5 --  C6  . BREAST BIOPSY Left 11/03/2016   Procedure: BREAST BIOPSY WITH NEEDLE LOCALIZATION;  Surgeon: Robert Bellow, MD;  Location: [facility] ORS;  Service: General;  Laterality: Left;  . BREAST EXCISIONAL BIOPSY Left 11/03/2016   excision of calcs. Path pending  . CARDIAC CATHETERIZATION  12-28-2006  DR [facility]   NORMAL CORONARY ARTERIES  . CARPAL TUNNEL RELEASE Right 01-20-2007  . COLONOSCOPY  2015  . ERCP  08/15/2011   Procedure: ENDOSCOPIC RETROGRADE CHOLANGIOPANCREATOGRAPHY (ERCP);  Surgeon: Jeryl Columbia, MD;  Location: [facility] ENDOSCOPY;  Service: Endoscopy;  Laterality: N/A;  . ERCP     MULTIPLE--  INCLUDING STENTS, SPHINTEROTOMY'S  STONE REMOVAL   . FEMUR IM NAIL Right 04/26/2014   Procedure: INTRAMEDULLARY (IM) NAIL FEMORAL;  Surgeon: Alta Corning, MD;  Location: [place];  Service: Orthopedics;  Laterality: Right;  . I&D EXTREMITY Right 02/07/2013   Procedure: IRRIGATION AND DEBRIDEMENT OF RIGHT LOWER LEG WITH PLACEMENT OF ACELL AND WOULND VAC;  Surgeon: Theodoro Kos, DO;  Location: [place];  Service: Plastics;  Laterality: Right;  . LAPAROSCOPIC CHOLECYSTECTOMY  04-08-2002  . MITRAL VALVE  [place]  . ORIF ANKLE FRACTURE Right 11/05/2012   Procedure: OPEN REDUCTION INTERNAL FIXATION (ORIF) ANKLE FRACTURE only operating on right;  Surgeon: Alta Corning, MD;  Location: [place];  Service: Orthopedics;  Laterality: Right;  . ORIF RIGHT ULNAR FX W/ ILIAC CREST BONE GRAFT  10-06-2008  . ORIF WRIST FRACTURE Right 04/26/2014   Procedure: OPEN  REDUCTION INTERNAL FIXATION (ORIF) WRIST FRACTURE;  Surgeon: Alta Corning, MD;  Location: [place];  Service: Orthopedics;  Laterality: Right;  . TRANSTHORACIC ECHOCARDIOGRAM  06-29-2009  dr wall   normal lvsf/ ef 55-60%/  normal bileaflet mechanical mvr/ moderated dilated la/ moderate tr  . WRIST ARTHROSCOPY Right 12-10-2007   debridement and ulnar shortening osteotomy w/ plate    Allergies Patient has no known allergies.  Social History Social History  Substance Use Topics  . Smoking status: Never Smoker  . Smokeless tobacco: Never Used  . Alcohol use No    Review of Systems Constitutional: Negative for fever. Eyes: Negative for vision changes ENT:  Positive for tingling in the mouth and tongue Cardiovascular: Negative for chest pain. Respiratory: Negative for shortness of breath. Gastrointestinal: Negative for abdominal pain, vomiting and diarrhea. Genitourinary: Negative for dysuria. Musculoskeletal: Negative for back pain. Skin: Negative for rash. Neurological: Negative for headaches, positive for paresthesias  All systems negative/normal/unremarkable except as stated in the HPI  ____________________________________________   PHYSICAL EXAM:  VITAL SIGNS: ED Triage Vitals  Enc Vitals Group     BP 01/23/17 1438 (!) 189/112     Pulse Rate 01/23/17 1438 86     Resp 01/23/17 1438 16     Temp 01/23/17 1438 98.4 F (36.9 C)     Temp Source 01/23/17 1438 Oral     SpO2 01/23/17 1438 97 %     Weight 01/23/17 1439 179 lb (81.2 kg)     Height 01/23/17 1439 5\' 3"  (1.6 m)     Head Circumference --      Peak Flow --      Pain Score 01/23/17 1438 0     Pain Loc --      Pain Edu? --      Excl. in [place]? --     Constitutional: Alert and oriented. Well appearing and in no distress. Eyes: Conjunctivae are normal. Normal extraocular movements. ENT   Head: Normocephalic, Bruising on the right lower face externally   Nose: No congestion/rhinnorhea.   Mouth/Throat:  Mucous membranes are moist.   Neck: No stridor. Cardiovascular: Rapid rate, irregular rhythm. No murmurs, rubs, or gallops. Respiratory: Normal respiratory effort without tachypnea nor retractions. Breath sounds are clear and equal bilaterally. No wheezes/rales/rhonchi. Gastrointestinal: Soft and nontender. Normal bowel sounds Musculoskeletal: Nontender with normal range of motion in extremities. No lower extremity tenderness nor edema. Neurologic:  Normal speech and language. No gross focal neurologic deficits are appreciated.  Skin:  Skin is warm, dry and intact. No rash noted. Psychiatric: Mood and affect are normal. Speech and behavior are normal.  ____________________________________________  EKG: Interpreted by me. Atrial fibrillation with a rapid ventricular response, rate is 185 bpm, normal QRS size, normal QT. Nonspecific ST changes  ____________________________________________  ED COURSE:  Pertinent labs & imaging results that were available during my care of the patient were reviewed by me and considered in my medical decision making (see chart for details). Patient presents for tingling post dental extraction and was found to be in A. fib with RVR, we will assess with  labs and imaging as indicated.   Procedures ____________________________________________   LABS (pertinent positives/negatives)  Labs Reviewed  CBC WITH DIFFERENTIAL/PLATELET - Abnormal; Notable for the following:       Result Value   RDW 14.8 (*)    Lymphs Abs 0.9 (*)    All other components within normal limits  PROTIME-INR - Abnormal; Notable for the following:    Prothrombin Time 20.6 (*)    All other components within normal limits  COMPREHENSIVE METABOLIC PANEL - Abnormal; Notable for the following:    Glucose, Bld 115 (*)    Total Protein 8.7 (*)    AST 42 (*)    Total Bilirubin 1.3 (*)    All other components within normal limits  APTT  TROPONIN I  TSH    RADIOLOGY Images were viewed  by me  Chest x-ray IMPRESSION: Mild cardiomegaly and pulmonary vascular congestion. ____________________________________________  FINAL ASSESSMENT AND PLAN  Rapid atrial fibrillation, paresthesias  Plan: Patient's labs and imaging were dictated above. Patient had presented for Paresthesias which resolved after controlling the rate of her atrial fibrillation. She was given IV metoprolol as well as oral metoprolol for her heart rate. Family has requested an ultrasound of the right upper quadrant to evaluate for choledocholithiasis   Earleen Newport, MD   Note: This note was generated in part or whole with voice recognition software. Voice recognition is usually quite accurate but there are transcription errors that can and very often do occur. I apologize for any typographical errors that were not detected and corrected.     Earleen Newport, MD 01/23/17 [phone]

## 2017-01-23 NOTE — ED Provider Notes (Signed)
-----------------------------------------   5:13 PM on 01/23/2017 -----------------------------------------  Patient's heart rate remains controlled around 90-100 bpm. Patient's ultrasound is negative. Chest x-rays negative. Labs are largely at baseline. Patient is feeling well. We will discharge home with PCP follow-up. Patient agreeable to plan.   Harvest Dark, MD 01/23/17 [phone]

## 2017-01-23 NOTE — ED Notes (Signed)
Pt. Presents to ED with numbness to lt. Side of face and body that started around 14:15 today.  Pt. Also states a hx of A-fib.

## 2017-01-23 NOTE — ED Notes (Signed)
Pt. Going home with family. 

## 2017-01-23 NOTE — ED Triage Notes (Signed)
Patient had tooth extraction yesterday and is taking antibiotics currently. Within the last 10 minutes around 2:31pm patient reported numbness in her tongue, L side of face, and L fingers. Denies numbness in L leg.Takes coumadin daily for A-fib. No facial droop noted. Speech clear. Denies SOB or trouble swallowing

## 2017-01-27 ENCOUNTER — Encounter: Payer: Self-pay | Admitting: Family Medicine

## 2017-01-27 ENCOUNTER — Ambulatory Visit (INDEPENDENT_AMBULATORY_CARE_PROVIDER_SITE_OTHER): Payer: Medicare Other | Admitting: Family Medicine

## 2017-01-27 VITALS — BP 123/72 | HR 78 | Temp 97.9°F | Resp 16 | Ht 63.0 in | Wt 184.0 lb

## 2017-01-27 DIAGNOSIS — R201 Hypoesthesia of skin: Secondary | ICD-10-CM | POA: Diagnosis not present

## 2017-01-27 DIAGNOSIS — I482 Chronic atrial fibrillation, unspecified: Secondary | ICD-10-CM

## 2017-01-27 DIAGNOSIS — I10 Essential (primary) hypertension: Secondary | ICD-10-CM | POA: Diagnosis not present

## 2017-01-27 DIAGNOSIS — R202 Paresthesia of skin: Secondary | ICD-10-CM

## 2017-01-27 NOTE — Assessment & Plan Note (Signed)
Stable now with resolved AFib RVR after ED visit 8/3, suspected RVR trigger from recent dental procedure and tooth abscess likely infection - Hemodynamically stable now, normal vs today  Plan: 1. No change to BB, continue Metoprolol succ XL 100mg  daily 2. Reassurance 3. Follow-up with Dr Stanford Breed cardiology as planned 06/2017, advised if recurrence or home vitals abnormal reading HR >120 consistently or symptomatic, needs to follow-up sooner and notify Cardiology

## 2017-01-27 NOTE — Patient Instructions (Addendum)
Thank you for coming to the clinic today.  1. No further changes 2. Continue current Metoprolol dose 3. Likely related to dental procedure 4. If recurrence again or concerns with elevated HR abnormal BP or other - then contact Dr Stanford Breed earlier, otherwise see him 06/2017  Keep other scheduled apt and follow-up with me in 05/2018  If you have any other questions or concerns, please feel free to call the clinic or send a message through [place]. You may also schedule an earlier appointment if necessary.  Additionally, you may be receiving a survey about your experience at our clinic within a few days to 1 week by e-mail or mail. We value your feedback.  Nobie Putnam, DO [place]

## 2017-01-27 NOTE — Assessment & Plan Note (Signed)
Stable, controlled BP S/p resolved AFib RVR No changes to meds at this time Reassurance continue Lisnopril and Metoprolol XL 100mg  daily

## 2017-01-27 NOTE — Progress Notes (Signed)
Subjective:    Patient ID: [name], female    DOB: 04/13/1956, 61 y.o.   MRN: [id number]  [name] is a 61 y.o. female presenting on 01/27/2017 for Hospitalization Follow-up (afib)   HPI   ED FOLLOW-UP VISIT  Hospital/Location: [facility] ED Date of ED Visit: 01/23/17  Reason for Presenting to ED: Numbness mouth and tongue / L Shoulder Primary (+Secondary) Diagnosis: AFib with RVR, Paresthesia  FOLLOW-UP - ED provider note and record have been reviewed - Patient presents today about 4 days after recent ED visit. Brief summary of recent course, patient had symptoms of sudden onset numbness in various locations mouth, tongue, L shoulder for few minutes to < 1 hour and went directly to hospital ED, found to have elevated HR >185 with AFib with RVR, and had normal EKG otherwise, given Metoprolol IV x 2 doses with improvement, but she did not feel significant symptoms from the tachycardia. Testing in ED with labs unremarkable, had CXR and Abd [place] (prior had gallstones or choledocholithiasis even s/p cholecystectomy), given one Metoprolol 25mg  PO x 1 before leaving ED. - Also updated she states recently saw dentist on Weds 8/1 and had abscess tooth pulled (did not receive antibiotics before), she did not have significant numbness lasting after this procedure. She was given antibiotics with Amoxicillin for 10 days by dentist post-op, has 1 more day left - Today reports overall has done well after discharge from ED. Symptoms of paresthesias and peri-oral tingling have resolved. She never felt bad with the RVR and states her vitals have been stable after discharge, checking BP and HR at home with normal readings. Normal today as well. No new concerns. She is scheduled to see her Cardiologist in 06/2017 as anticipated (Dr Stanford Breed) - New medications on discharge: Hydrocodone Acetaminophen 5/325mg  #10 tabs, only took 1 dose, then done, was for back pain - Changes to current meds on discharge:  None - Denies any chest pain, shortness of breath, nausea vomiting fever/chills, abdominal pain or back pain, weakness numbness or tingling   Social History  Substance Use Topics  . Smoking status: Never Smoker  . Smokeless tobacco: Never Used  . Alcohol use No    Review of Systems Per HPI unless specifically indicated above     Objective:    BP 123/72   Pulse 78   Temp 97.9 F (36.6 C) (Oral)   Resp 16   Ht 5\' 3"  (1.6 m)   Wt 184 lb (83.5 kg)   BMI 32.59 kg/m   Wt Readings from Last 3 Encounters:  01/27/17 184 lb (83.5 kg)  01/23/17 179 lb (81.2 kg)  12/02/16 190 lb (86.2 kg)    Physical Exam  Constitutional: She is oriented to person, place, and time. She appears well-developed and well-nourished. No distress.  Well-appearing, comfortable, cooperative  HENT:  Head: Normocephalic and atraumatic.  Mouth/Throat: Oropharynx is clear and moist.  Eyes: Conjunctivae are normal. Right eye exhibits no discharge. Left eye exhibits no discharge.  Neck: Normal range of motion. Neck supple. No thyromegaly present.  Cardiovascular: Normal rate, normal heart sounds and intact distal pulses.   No murmur heard. Irregularly irregular rhythm. Mechanical heart valve clicking  Pulmonary/Chest: Effort normal and breath sounds normal. No respiratory distress. She has no wheezes. She has no rales.  Abdominal: Soft. She exhibits no distension. There is no tenderness.  Musculoskeletal: Normal range of motion. She exhibits no edema.  Lymphadenopathy:    She has no cervical adenopathy.  Neurological: She is alert and oriented to person, place, and time. No cranial nerve deficit.  Distal muscle strength and sensation intact, resolved peri-oral tingling  Skin: Skin is warm and dry. No rash noted. She is not diaphoretic. No erythema.  Psychiatric: She has a normal mood and affect. Her behavior is normal.  Well groomed, good eye contact, normal speech and thoughts  Nursing note and vitals  reviewed.    I have personally reviewed the radiology report from 01/23/17 CXR.  CLINICAL DATA:  61 y/o  F; tingling, atrial fibrillation with RVR.  EXAM: PORTABLE CHEST 1 VIEW  COMPARISON:  04/26/2010 chest radiograph  FINDINGS: Mild cardiomegaly, stable given projection and technique. Mitral valve replacement. Post median sternotomy with wires aligned. Partially visualized anterior cervical fusion hardware. Right upper quadrant surgical clips, likely cholecystectomy. Mild pulmonary vascular congestion. No consolidation, effusion, or pneumothorax. No acute osseous abnormality is evident.  IMPRESSION: Mild cardiomegaly and pulmonary vascular congestion.   Electronically Signed   By: Kristine Garbe M.D.   On: 01/23/2017 15:09  ----------------------------------  CLINICAL DATA:  Back pain.  Elevated bilirubin.  Cholecystectomy.  EXAM: ULTRASOUND ABDOMEN LIMITED RIGHT UPPER QUADRANT  COMPARISON:  03/19/2016 .  FINDINGS: Gallbladder:  No gallstones or wall thickening visualized. No sonographic Murphy sign noted by sonographer.  Common bile duct:  Diameter: 9.2 mm  Liver:  Increased echogenicity consistent with fatty infiltration and/or hepatocellular disease. No focal hepatic abnormality identified .  IMPRESSION: 1. Increase echogenicity consistent fatty infiltration and/or hepatocellular disease. No focal hepatic abnormality identified.  2. Cholecystectomy.  No biliary distention .   Electronically Signed   By: Marcello Moores  Register   On: 01/23/2017 16:34   Results for orders placed or performed during the hospital encounter of 01/23/17  CBC with Differential  Result Value Ref Range   WBC 6.0 3.6 - 11.0 K/uL   RBC 4.83 3.80 - 5.20 MIL/uL   Hemoglobin 13.3 12.0 - 16.0 g/dL   HCT 40.9 35.0 - 47.0 %   MCV 84.8 80.0 - 100.0 fL   MCH 27.6 26.0 - 34.0 pg   MCHC 32.5 32.0 - 36.0 g/dL   RDW 14.8 (H) 11.5 - 14.5 %   Platelets 156  150 - 440 K/uL   Neutrophils Relative % 72 %   Neutro Abs 4.3 1.4 - 6.5 K/uL   Lymphocytes Relative 16 %   Lymphs Abs 0.9 (L) 1.0 - 3.6 K/uL   Monocytes Relative 10 %   Monocytes Absolute 0.6 0.2 - 0.9 K/uL   Eosinophils Relative 1 %   Eosinophils Absolute 0.0 0 - 0.7 K/uL   Basophils Relative 1 %   Basophils Absolute 0.0 0 - 0.1 K/uL  Protime-INR  Result Value Ref Range   Prothrombin Time 20.6 (H) 11.4 - 15.2 seconds   INR 1.74   APTT  Result Value Ref Range   aPTT 35 24 - 36 seconds  Comprehensive metabolic panel  Result Value Ref Range   Sodium 138 135 - 145 mmol/L   Potassium 3.5 3.5 - 5.1 mmol/L   Chloride 102 101 - 111 mmol/L   CO2 27 22 - 32 mmol/L   Glucose, Bld 115 (H) 65 - 99 mg/dL   BUN 12 6 - 20 mg/dL   Creatinine, Ser 0.97 0.44 - 1.00 mg/dL   Calcium 9.4 8.9 - 10.3 mg/dL   Total Protein 8.7 (H) 6.5 - 8.1 g/dL   Albumin 3.9 3.5 - 5.0 g/dL   AST 42 (H) 15 - 41  U/L   ALT 27 14 - 54 U/L   Alkaline Phosphatase 106 38 - 126 U/L   Total Bilirubin 1.3 (H) 0.3 - 1.2 mg/dL   GFR calc non Af Amer >60 >60 mL/min   GFR calc Af Amer >60 >60 mL/min   Anion gap 9 5 - 15      Assessment & Plan:   Problem List Items Addressed This Visit    Essential hypertension    Stable, controlled BP S/p resolved AFib RVR No changes to meds at this time Reassurance continue Lisnopril and Metoprolol XL 100mg  daily      Chronic atrial fibrillation (H[facility])    Stable now with resolved AFib RVR after ED visit 8/3, suspected RVR trigger from recent dental procedure and tooth abscess likely infection - Hemodynamically stable now, normal vs today  Plan: 1. No change to BB, continue Metoprolol succ XL 100mg  daily 2. Reassurance 3. Follow-up with Dr Stanford Breed cardiology as planned 06/2017, advised if recurrence or home vitals abnormal reading HR >120 consistently or symptomatic, needs to follow-up sooner and notify Cardiology       Other Visit Diagnoses    Chronic atrial fibrillation with  rapid ventricular response ([place])    -  Primary   Resolved RVR after ED 8/3 with IV metop. Continue current PO daily metoprolol for now.   Facial tingling sensation       Resolved. Likely secondary to dental procedure. Negative neuro exam in ED and in office today.       I have reviewed the discharge medication list, and have reconciled the current and discharge medications today.   Current Outpatient Prescriptions:  .  alendronate (FOSAMAX) 35 MG tablet, Take 1 tablet (35 mg total) by mouth every 7 (seven) days. Take with a full glass of water on an empty stomach., Disp: 12 tablet, Rfl: 3 .  amitriptyline (ELAVIL) 50 MG tablet, Take 50 mg by mouth at bedtime as needed for sleep. , Disp: , Rfl:  .  amoxicillin (AMOXIL) 875 MG tablet, , Disp: , Rfl:  .  aspirin EC 81 MG tablet, Take 1 tablet (81 mg total) by mouth daily., Disp: 90 tablet, Rfl: 3 .  atorvastatin (LIPITOR) 40 MG tablet, Take 1 tablet (40 mg total) by mouth every evening., Disp: 90 tablet, Rfl: 3 .  calcium carbonate (OSCAL) 1500 (600 Ca) MG TABS tablet, Take 1 tablet by mouth daily. , Disp: , Rfl:  .  cetirizine (ZYRTEC) 10 MG tablet, Take 1 tablet (10 mg total) by mouth daily., Disp: 30 tablet, Rfl: 11 .  Cholecalciferol (VITAMIN D3) 50000 units CAPS, Take 50,000 Units by mouth once a week. For 12 weeks, then switch to OTC Vitamin D3 2,000 unit daily (Patient taking differently: Take 50,000 Units by mouth every Wednesday. For 12 weeks, then switch to OTC Vitamin D3 2,000 unit daily), Disp: 12 capsule, Rfl: 0 .  cycloSPORINE (RESTASIS) 0.05 % ophthalmic emulsion, Place 1 drop into both eyes 2 (two) times daily as needed (dry eyes)., Disp: , Rfl:  .  dicyclomine (BENTYL) 10 MG capsule, Take 1 capsule (10 mg total) by mouth 4 (four) times daily -  before meals and at bedtime. (Patient taking differently: Take 10 mg by mouth at bedtime. ), Disp: 90 capsule, Rfl: 3 .  fluticasone (FLONASE) 50 MCG/ACT nasal spray, Place 2 sprays into  both nostrils daily. (Patient taking differently: Place 2 sprays into both nostrils daily as needed for allergies or rhinitis. ), Disp: 16 g, Rfl:  11 .  ibuprofen (ADVIL,MOTRIN) 200 MG tablet, Take 200 mg by mouth 2 (two) times daily as needed for headache or moderate pain., Disp: , Rfl:  .  ipratropium (ATROVENT) 0.06 % nasal spray, Place 2 sprays into both nostrils 4 (four) times daily. For up to 5-7 days then stop., Disp: 15 mL, Rfl: 2 .  lisinopril (PRINIVIL,ZESTRIL) 40 MG tablet, Take 1 tablet (40 mg total) by mouth daily. (Patient taking differently: Take 40 mg by mouth every evening. ), Disp: 90 tablet, Rfl: 3 .  metoprolol succinate (TOPROL-XL) 100 MG 24 hr tablet, Take 1 tablet (100 mg total) by mouth daily. Take with or immediately following a meal. (Patient taking differently: Take 100 mg by mouth every morning. Take with or immediately following a meal.), Disp: 90 tablet, Rfl: 3 .  Multiple Vitamins-Minerals (CENTRUM SILVER ADULT 50+ PO), Take 1 tablet by mouth daily., Disp: , Rfl:  .  warfarin (COUMADIN) 5 MG tablet, Take 1 tablet by mouth except 2.5 mg on Tuesdays and Saturdays, Disp: 95 tablet, Rfl: 1   Follow up plan: Return in about 4 months (around 06/03/2017) for Annual Physical.  Nobie Putnam, [facility] 01/27/2017, 12:34 PM

## 2017-02-04 ENCOUNTER — Ambulatory Visit (INDEPENDENT_AMBULATORY_CARE_PROVIDER_SITE_OTHER): Payer: Medicare Other

## 2017-02-04 DIAGNOSIS — Z5181 Encounter for therapeutic drug level monitoring: Secondary | ICD-10-CM

## 2017-02-04 DIAGNOSIS — I059 Rheumatic mitral valve disease, unspecified: Secondary | ICD-10-CM

## 2017-02-04 DIAGNOSIS — Z9889 Other specified postprocedural states: Secondary | ICD-10-CM

## 2017-02-04 DIAGNOSIS — I4891 Unspecified atrial fibrillation: Secondary | ICD-10-CM

## 2017-02-04 DIAGNOSIS — I482 Chronic atrial fibrillation, unspecified: Secondary | ICD-10-CM

## 2017-02-04 LAB — POCT INR: INR: 2.3

## 2017-02-13 ENCOUNTER — Ambulatory Visit (INDEPENDENT_AMBULATORY_CARE_PROVIDER_SITE_OTHER): Payer: Medicare Other | Admitting: Endocrinology

## 2017-02-13 ENCOUNTER — Encounter: Payer: Self-pay | Admitting: Endocrinology

## 2017-02-13 VITALS — BP 146/82 | HR 88 | Wt 183.6 lb

## 2017-02-13 DIAGNOSIS — E042 Nontoxic multinodular goiter: Secondary | ICD-10-CM

## 2017-02-13 NOTE — Patient Instructions (Addendum)
No medication is needed for the thyroid now. most of the time, a "lumpy thyroid" will eventually become overactive again.  this is usually a slow process, happening over the span of many years. Let's check the ultrasound.  you will receive a phone call, about a day and time for an appointment.

## 2017-02-13 NOTE — Progress Notes (Signed)
Subjective:    Patient ID: [name], female    DOB: [birth year], 61 y.o.   MRN: [id number]  HPI Pt is referred by Dr Parks Ranger, for hyperthyroidism.  Pt reports he was dx'ed with hyperthyroidism in approx 2000.  she took an oral medication for this for a few years, but none since then.  she has never had XRT to the anterior neck, or thyroid surgery.   she does not consume kelp or any other prescribed or non-prescribed thyroid medication.  she has never been on amiodarone.  She has chronic palpitations in the chest, and assoc anxiety Past Medical History:  Diagnosis Date  . Anticoagulated on warfarin   . Arthritis   . Chronic atrial fibrillation ([place])    SINCE 1997  . Dysrhythmia   . Goiter    multinodular  . Heart murmur   . History of hyperthyroidism    2003--  PTU [place]  . HTN (hypertension)    unspecified  . Hypercholesterolemia   . Hyperlipidemia   . Hyperthyroidism    H/O  . Pulmonary hypertension, mild ([place])   . Rheumatic heart disease    CARDIOLOGIST-- DR Marcello Moores [facility]  . S/P MVR (mitral valve replacement)    1997  [facility]--  SECONDARY TO SEVERE MV STENOSIS    Past Surgical History:  Procedure Laterality Date  . ANTERIOR CERVICAL DECOMP/DISCECTOMY FUSION  12-21-2001   C5 --  C6  . BREAST BIOPSY Left 11/03/2016   Procedure: BREAST BIOPSY WITH NEEDLE LOCALIZATION;  Surgeon: Robert Bellow, MD;  Location: [facility] OR[facility];  Service: General;  Laterality: Left;  . BREAST EXCISIONAL BIOPSY Left 11/03/2016   excision of calcs. Path pending  . CARDIAC CATHETERIZATION  12-28-2006  [facility]   NORMAL CORONARY ARTERIES  . CARPAL TUNNEL RELEASE Right 01-20-2007  . COLONOSCOPY  2015  . ERCP  08/15/2011   Procedure: ENDOSCOPIC RETROGRADE CHOLANGIOPANCREATOGRAPHY (ERCP);  Surgeon: Jeryl Columbia, MD;  Location: [facility] ENDOSCOPY;  Service: Endoscopy;  Laterality: N/A;  . ERCP     MULTIPLE--  INCLUDING STENTS, SPHINTEROTOMY'S  STONE REMOVAL   . FEMUR IM NAIL Right 04/26/2014   Procedure: INTRAMEDULLARY (IM) NAIL FEMORAL;  Surgeon: Alta Corning, MD;  Location: [place];  Service: Orthopedics;  Laterality: Right;  . I&D EXTREMITY Right 02/07/2013   Procedure: IRRIGATION AND DEBRIDEMENT OF RIGHT LOWER LEG WITH PLACEMENT OF ACELL AND WOULND VAC;  Surgeon: Theodoro Kos, DO;  Location: [place];  Service: Plastics;  Laterality: Right;  . LAPAROSCOPIC CHOLECYSTECTOMY  04-08-2002  . MITRAL VALVE REPLACEMENT  1997   [facility]  . ORIF ANKLE FRACTURE Right 11/05/2012   Procedure: OPEN REDUCTION INTERNAL FIXATION (ORIF) ANKLE FRACTURE only operating on right;  Surgeon: Alta Corning, MD;  Location: [place];  Service: Orthopedics;  Laterality: Right;  . ORIF RIGHT ULNAR FX W/ ILIAC CREST BONE GRAFT  10-06-2008  . ORIF WRIST FRACTURE Right 04/26/2014   Procedure: OPEN REDUCTION INTERNAL FIXATION (ORIF) WRIST FRACTURE;  Surgeon: Alta Corning, MD;  Location: [place];  Service: Orthopedics;  Laterality: Right;  . TRANSTHORACIC ECHOCARDIOGRAM  06-29-2009  dr wall   normal lvsf/ ef 55-60%/  normal bileaflet mechanical mvr/ moderated dilated la/ moderate tr  . WRIST ARTHROSCOPY Right 12-10-2007   debridement and ulnar shortening osteotomy w/ plate    Social History   Social History  . Marital status: Divorced    Spouse name: N/A  . Number of children: N/A  . Years of  education: N/A   Occupational History  . Control and instrumentation engineer    Social History Main Topics  . Smoking status: Never Smoker  . Smokeless tobacco: Never Used  . Alcohol use No  . Drug use: No  . Sexual activity: Not on file   Other Topics Concern  . Not on file   Social History Narrative  . No narrative on file    Current Outpatient Prescriptions on File Prior to Visit  Medication Sig Dispense Refill  . alendronate (FOSAMAX) 35 MG tablet Take 1 tablet (35 mg total) by mouth every 7 (seven) days. Take with a full glass of water on an empty stomach. 12 tablet 3  . amitriptyline (ELAVIL) 50 MG  tablet Take 50 mg by mouth at bedtime as needed for sleep.     Marland Kitchen aspirin EC 81 MG tablet Take 1 tablet (81 mg total) by mouth daily. 90 tablet 3  . atorvastatin (LIPITOR) 40 MG tablet Take 1 tablet (40 mg total) by mouth every evening. 90 tablet 3  . calcium carbonate (OSCAL) 1500 (600 Ca) MG TABS tablet Take 1 tablet by mouth daily.     . cetirizine (ZYRTEC) 10 MG tablet Take 1 tablet (10 mg total) by mouth daily. 30 tablet 11  . Cholecalciferol (VITAMIN D3) 50000 units CAPS Take 50,000 Units by mouth once a week. For 12 weeks, then switch to OTC Vitamin D3 2,000 unit daily (Patient taking differently: Take 50,000 Units by mouth every Wednesday. For 12 weeks, then switch to OTC Vitamin D3 2,000 unit daily) 12 capsule 0  . cycloSPORINE (RESTASIS) 0.05 % ophthalmic emulsion Place 1 drop into both eyes 2 (two) times daily as needed (dry eyes).    Marland Kitchen dicyclomine (BENTYL) 10 MG capsule Take 1 capsule (10 mg total) by mouth 4 (four) times daily -  before meals and at bedtime. (Patient taking differently: Take 10 mg by mouth at bedtime. ) 90 capsule 3  . fluticasone (FLONASE) 50 MCG/ACT nasal spray Place 2 sprays into both nostrils daily. (Patient taking differently: Place 2 sprays into both nostrils daily as needed for allergies or rhinitis. ) 16 g 11  . ibuprofen (ADVIL,MOTRIN) 200 MG tablet Take 200 mg by mouth 2 (two) times daily as needed for headache or moderate pain.    Marland Kitchen ipratropium (ATROVENT) 0.06 % nasal spray Place 2 sprays into both nostrils 4 (four) times daily. For up to 5-7 days then stop. 15 mL 2  . lisinopril (PRINIVIL,ZESTRIL) 40 MG tablet Take 1 tablet (40 mg total) by mouth daily. (Patient taking differently: Take 40 mg by mouth every evening. ) 90 tablet 3  . metoprolol succinate (TOPROL-XL) 100 MG 24 hr tablet Take 1 tablet (100 mg total) by mouth daily. Take with or immediately following a meal. (Patient taking differently: Take 100 mg by mouth every morning. Take with or immediately  following a meal.) 90 tablet 3  . Multiple Vitamins-Minerals (CENTRUM SILVER ADULT 50+ PO) Take 1 tablet by mouth daily.    Marland Kitchen warfarin (COUMADIN) 5 MG tablet Take 1 tablet by mouth except 2.5 mg on Tuesdays and Saturdays 95 tablet 1  . amoxicillin (AMOXIL) 875 MG tablet      No current facility-administered medications on file prior to visit.     No Known Allergies  Family History  Problem Relation Age of Onset  . Hypothyroidism Sister   . Gallbladder disease Sister   . Coronary artery disease Unknown   . Diabetes Mother   . Hypertension  Mother   . Heart disease Father   . Stroke Father   . Breast cancer Neg Hx     BP (!) 146/82   Pulse 88   Wt 183 lb 9.6 oz (83.3 kg)   SpO2 96%   BMI 32.52 kg/m    Review of Systems Denies headache, hoarseness, diplopia, leg edema, sob, diarrhea, polyuria, muscle weakness, excessive diaphoresis, tremor, heat intolerance, and rhinorrhea. She has lost a few lbs. She has easy bruising.    Objective:   Physical Exam VS: see vs page GEN: no distress HEAD: head: no deformity eyes: no periorbital swelling, no proptosis external nose and ears are normal mouth: no lesion seen NECK: a healed scar is present (C-spine procedure).  possible left sided nodule, but I can't be sure. CHEST WALL: no deformity.  LUNGS: clear to auscultation CV: normal rate but irreg rhythm, no murmur.  ABD: abdomen is soft, nontender.  no hepatosplenomegaly.  not distended.  no hernia MUSCULOSKELETAL: muscle bulk and strength are grossly normal.  no obvious joint swelling.  gait is normal and steady EXTEMITIES: no deformity.  no ulcer on the feet.  feet are of normal color and temp.  Trace bilat leg edema PULSES: dorsalis pedis intact bilat.  no carotid bruit NEURO:  cn 2-12 grossly intact.   readily moves all 4's.  sensation is intact to touch on the feet.  No tremor SKIN:  Normal texture and temperature.  No rash or suspicious lesion is visible.  Old healed surgical  scars on both ankles.  Not diaphoretic NODES:  None palpable at the neck PSYCH: alert, well-oriented.  Does not appear anxious nor depressed.      Lab Results  Component Value Date   TSH 0.71 12/02/2016    Chest CT (2010): 8 mm low density nodule in the left thyroid lobe   nuc med (2000): 24 HOUR UPTAKE IS 10.6%.  NORMAL RANGE 10 TO 35%. THYROID GLAND SHOWS DIFFUSE HETEROGENEOUS TRACER ACCUMULATION WITH MARKED DIMINISHED ACTIVITYWITHIN THE RIGHT LOBE WITH FOCAL AREAS OF NORMAL ACTIVITY.  THE LEFT LOBE IS DIFFUSELY HETEROGENEOUS.  NO DISCERNIBLE LESIONS ARE SEEN. THE FEATURES LIKELY REFLECT MANIFESTATION OF A MULTINODULAR GOITER.  Lab Results  Component Value Date   TSH 0.71 12/02/2016   I have reviewed outside records, and summarized: Pt was noted to have goiter, and referred here.  He was also under rx for AF, but TSH was normal     Assessment & Plan:  Multinodular goiter, usually hereditary Hyperthyroidiism, intermittent, sometimes seen with MNG. AF: not thyroid-related.  Patient Instructions  No medication is needed for the thyroid now. most of the time, a "lumpy thyroid" will eventually become overactive again.  this is usually a slow process, happening over the span of many years. Let's check the ultrasound.  you will receive a phone call, about a day and time for an appointment.

## 2017-02-18 ENCOUNTER — Ambulatory Visit (INDEPENDENT_AMBULATORY_CARE_PROVIDER_SITE_OTHER): Payer: Medicare Other

## 2017-02-18 ENCOUNTER — Ambulatory Visit
Admission: RE | Admit: 2017-02-18 | Discharge: 2017-02-18 | Disposition: A | Discharge status: Home / Self Care | Payer: Medicare Other | Source: Ambulatory Visit | Attending: Endocrinology | Admitting: Endocrinology

## 2017-02-18 DIAGNOSIS — I059 Rheumatic mitral valve disease, unspecified: Secondary | ICD-10-CM

## 2017-02-18 DIAGNOSIS — I482 Chronic atrial fibrillation, unspecified: Secondary | ICD-10-CM

## 2017-02-18 DIAGNOSIS — Z5181 Encounter for therapeutic drug level monitoring: Secondary | ICD-10-CM

## 2017-02-18 DIAGNOSIS — E042 Nontoxic multinodular goiter: Secondary | ICD-10-CM

## 2017-02-18 DIAGNOSIS — I4891 Unspecified atrial fibrillation: Secondary | ICD-10-CM | POA: Diagnosis not present

## 2017-02-18 DIAGNOSIS — Z9889 Other specified postprocedural states: Secondary | ICD-10-CM

## 2017-02-18 LAB — POCT INR: INR: 4.3

## 2017-03-02 ENCOUNTER — Ambulatory Visit (INDEPENDENT_AMBULATORY_CARE_PROVIDER_SITE_OTHER): Payer: Medicare Other

## 2017-03-02 DIAGNOSIS — Z5181 Encounter for therapeutic drug level monitoring: Secondary | ICD-10-CM | POA: Diagnosis not present

## 2017-03-02 DIAGNOSIS — I4891 Unspecified atrial fibrillation: Secondary | ICD-10-CM | POA: Diagnosis not present

## 2017-03-02 DIAGNOSIS — I482 Chronic atrial fibrillation, unspecified: Secondary | ICD-10-CM

## 2017-03-02 DIAGNOSIS — Z9889 Other specified postprocedural states: Secondary | ICD-10-CM | POA: Diagnosis not present

## 2017-03-02 DIAGNOSIS — I059 Rheumatic mitral valve disease, unspecified: Secondary | ICD-10-CM

## 2017-03-02 LAB — POCT INR: INR: 4.1

## 2017-03-05 ENCOUNTER — Encounter: Payer: Self-pay | Admitting: Gastroenterology

## 2017-03-05 ENCOUNTER — Ambulatory Visit (INDEPENDENT_AMBULATORY_CARE_PROVIDER_SITE_OTHER): Payer: Medicare Other | Admitting: Gastroenterology

## 2017-03-05 ENCOUNTER — Other Ambulatory Visit: Payer: Self-pay

## 2017-03-05 VITALS — BP 151/88 | HR 96 | Temp 98.2°F | Ht 63.0 in | Wt 183.0 lb

## 2017-03-05 DIAGNOSIS — R109 Unspecified abdominal pain: Secondary | ICD-10-CM

## 2017-03-05 NOTE — Progress Notes (Addendum)
Primary Care Physician: Olin Hauser, DO  Primary Gastroenterologist:  Dr. Lucilla Lame  Chief Complaint  Patient presents with  . Abdominal Pain    HPI: [name] is a 61 y.o. female here for abdominal pain. This patient reports that she had abdominal pain that lasted a few hours last month and the month before that.  The patient denies ever having no symptoms in past.  She states that the abdominal pain is a midepigastric area and starts in the mid back and comes to the front.  The patient denies any unexplained weight loss fevers chills nausea or vomiting.  The patient has a history of irritable bowel syndrome.  The pain is not made better or worse with eating or drinking.  The pain is not associated with moving her bowels.  There is been no change in bowel habits with the pain.  She states that she was lying on the couch with the pain started. She reports she also has pain in the right lower quadrant.  Current Outpatient Prescriptions  Medication Sig Dispense Refill  . alendronate (FOSAMAX) 35 MG tablet Take 1 tablet (35 mg total) by mouth every 7 (seven) days. Take with a full glass of water on an empty stomach. 12 tablet 3  . amitriptyline (ELAVIL) 50 MG tablet Take 50 mg by mouth at bedtime as needed for sleep.     Marland Kitchen amoxicillin (AMOXIL) 875 MG tablet     . aspirin EC 81 MG tablet Take 1 tablet (81 mg total) by mouth daily. 90 tablet 3  . atorvastatin (LIPITOR) 40 MG tablet Take 1 tablet (40 mg total) by mouth every evening. 90 tablet 3  . calcium carbonate (OSCAL) 1500 (600 Ca) MG TABS tablet Take 1 tablet by mouth daily.     . cetirizine (ZYRTEC) 10 MG tablet Take 1 tablet (10 mg total) by mouth daily. 30 tablet 11  . Cholecalciferol (VITAMIN D3) 50000 units CAPS Take 50,000 Units by mouth once a week. For 12 weeks, then switch to OTC Vitamin D3 2,000 unit daily (Patient taking differently: Take 50,000 Units by mouth every Wednesday. For 12 weeks, then switch to  OTC Vitamin D3 2,000 unit daily) 12 capsule 0  . cycloSPORINE (RESTASIS) 0.05 % ophthalmic emulsion Place 1 drop into both eyes 2 (two) times daily as needed (dry eyes).    Marland Kitchen dicyclomine (BENTYL) 10 MG capsule Take 1 capsule (10 mg total) by mouth 4 (four) times daily -  before meals and at bedtime. (Patient taking differently: Take 10 mg by mouth at bedtime. ) 90 capsule 3  . fluticasone (FLONASE) 50 MCG/ACT nasal spray Place 2 sprays into both nostrils daily. (Patient taking differently: Place 2 sprays into both nostrils daily as needed for allergies or rhinitis. ) 16 g 11  . ibuprofen (ADVIL,MOTRIN) 200 MG tablet Take 200 mg by mouth 2 (two) times daily as needed for headache or moderate pain.    Marland Kitchen ipratropium (ATROVENT) 0.06 % nasal spray Place 2 sprays into both nostrils 4 (four) times daily. For up to 5-7 days then stop. 15 mL 2  . lisinopril (PRINIVIL,ZESTRIL) 40 MG tablet Take 1 tablet (40 mg total) by mouth daily. (Patient taking differently: Take 40 mg by mouth every evening. ) 90 tablet 3  . metoprolol succinate (TOPROL-XL) 100 MG 24 hr tablet Take 1 tablet (100 mg total) by mouth daily. Take with or immediately following a meal. (Patient taking differently: Take 100 mg by mouth every  morning. Take with or immediately following a meal.) 90 tablet 3  . Multiple Vitamins-Minerals (CENTRUM SILVER ADULT 50+ PO) Take 1 tablet by mouth daily.    Marland Kitchen warfarin (COUMADIN) 5 MG tablet Take 1 tablet by mouth except 2.5 mg on Tuesdays and Saturdays 95 tablet 1  . HYDROcodone-acetaminophen (NORCO/VICODIN) 5-325 MG tablet      No current facility-administered medications for this visit.     Allergies as of 03/05/2017  . (No Known Allergies)    ROS:  General: Negative for anorexia, weight loss, fever, chills, fatigue, weakness. ENT: Negative for hoarseness, difficulty swallowing , nasal congestion. CV: Negative for chest pain, angina, palpitations, dyspnea on exertion, peripheral edema.    Respiratory: Negative for dyspnea at rest, dyspnea on exertion, cough, sputum, wheezing.  GI: See history of present illness. GU:  Negative for dysuria, hematuria, urinary incontinence, urinary frequency, nocturnal urination.  Endo: Negative for unusual weight change.    Physical Examination:   BP (!) 151/88   Pulse 96   Temp 98.2 F (36.8 C) (Oral)   Ht 5\' 3"  (1.6 m)   Wt 183 lb (83 kg)   BMI 32.42 kg/m   General: Well-nourished, well-developed in no acute distress.  Eyes: No icterus. Conjunctivae pink. Mouth: Oropharyngeal mucosa moist and pink , no lesions erythema or exudate. Lungs: Clear to auscultation bilaterally. Non-labored. Heart: Regular rate and rhythm, A mechanical Heart sound was heard from a prosthetic valve.  Abdomen: Bowel sounds are normal, Positive tenderness to one finger palpation of the abdominal wall with the patient's legs lifted 6 inches above the exam table, nondistended, no hepatosplenomegaly or masses, no abdominal bruits or hernia , no rebound or guarding.   Extremities: No lower extremity edema. No clubbing or deformities. Neuro: Alert and oriented x 3.  Grossly intact. Skin: Warm and dry, no jaundice.   Psych: Alert and cooperative, normal mood and affect.  Labs:    Imaging Studies: US Soft Tissue Head And Neck  Result Date: 02/18/2017 CLINICAL DATA:  Hypothyroid. 61 year old female with chronic hypothyroidism and palpable nodules on physical exam EXAM: THYROID ULTRASOUND TECHNIQUE: Ultrasound examination of the thyroid gland and adjacent soft tissues was performed. COMPARISON:  None. FINDINGS: Parenchymal Echotexture: Moderately heterogenous Isthmus: 0.4 cm Right lobe: 4.2 x 1.6 x 1.8 cm Left lobe: 4.5 x 1.4 x 1.9 cm _________________________________________________________ Estimated total number of nodules >/= 1 cm: 2 Number of spongiform nodules >/=  2 cm not described below (TR1): 0 Number of mixed cystic and solid nodules >/= 1.5 cm not described  below ([place]): 0 _________________________________________________________ Nodule # 6: Location: Left; Inferior Maximum size: 1.9 cm; Other 2 dimensions: 1.3 x 1.1 cm Composition: solid/almost completely solid (2) Echogenicity: hypoechoic (2) Shape: not taller-than-wide (0) Margins: smooth (0) Echogenic foci: macrocalcifications (1) ACR TI-RADS total points: 5. ACR TI-RADS risk category: TR4 (4-6 points). ACR TI-RADS recommendations: **Given size (>/= 1.5 cm) and appearance, fine needle aspiration of this moderately suspicious nodule should be considered based on TI-RADS criteria. _________________________________________________________ Numerous additional nodules scattered throughout both the right and left gland are either benign in appearance (spongiform) or small (less than 1 cm) and without suspicious features. None of these nodules meet criteria for further evaluation. IMPRESSION: 1. The 1.9 cm TI-RADS category 4 nodule (#6) in the inferior aspect of the left gland meets criteria for consideration of fine-needle aspiration biopsy. 2. Numerous additional nodules bilaterally are either benign in appearance or small an without suspicious features. None of these nodules meet criteria for  further evaluation. The above is in keeping with the ACR TI-RADS recommendations - J Am Coll Radiol 2017;14:587-595. Electronically Signed   By: Jacqulynn Cadet M.D.   On: 02/18/2017 12:55    Assessment and Plan:   MAKEISHA JENTSCH is a 61 y.o. y/o female who comes in today with musculoskeletal pain that is reproducible with 1 finger light palpation while raising the patient's legs 6 inches above the exam table thereby flexing abdominal wall muscles. I will send off a lipase because of the report of the pain radiating from her back to her epigastric area but this may be due to muscular skeletal sources also.  The patient has been explained the plan and agrees with it.    Lucilla Lame, MD. Marval Regal   Note: This dictation  was prepared with Dragon dictation along with smaller phrase technology. Any transcriptional errors that result from this process are unintentional.

## 2017-03-06 ENCOUNTER — Telehealth: Payer: Self-pay

## 2017-03-06 LAB — LIPASE: Lipase: 45 U/L (ref 14–72)

## 2017-03-06 NOTE — Telephone Encounter (Signed)
Pt notified of lab results

## 2017-03-06 NOTE — Telephone Encounter (Signed)
-----   Message from Lucilla Lame, MD sent at 03/06/2017  9:08 AM EDT ----- Let the patient know her lipase is normal

## 2017-03-16 ENCOUNTER — Ambulatory Visit (INDEPENDENT_AMBULATORY_CARE_PROVIDER_SITE_OTHER): Payer: Medicare Other

## 2017-03-16 DIAGNOSIS — I059 Rheumatic mitral valve disease, unspecified: Secondary | ICD-10-CM

## 2017-03-16 DIAGNOSIS — I4891 Unspecified atrial fibrillation: Secondary | ICD-10-CM | POA: Diagnosis not present

## 2017-03-16 DIAGNOSIS — I482 Chronic atrial fibrillation, unspecified: Secondary | ICD-10-CM

## 2017-03-16 DIAGNOSIS — Z5181 Encounter for therapeutic drug level monitoring: Secondary | ICD-10-CM | POA: Diagnosis not present

## 2017-03-16 DIAGNOSIS — Z9889 Other specified postprocedural states: Secondary | ICD-10-CM

## 2017-03-16 LAB — POCT INR: INR: 3

## 2017-03-30 DIAGNOSIS — Z23 Encounter for immunization: Secondary | ICD-10-CM | POA: Diagnosis not present

## 2017-04-06 ENCOUNTER — Ambulatory Visit (INDEPENDENT_AMBULATORY_CARE_PROVIDER_SITE_OTHER): Payer: Medicare Other

## 2017-04-06 DIAGNOSIS — Z9889 Other specified postprocedural states: Secondary | ICD-10-CM

## 2017-04-06 DIAGNOSIS — Z5181 Encounter for therapeutic drug level monitoring: Secondary | ICD-10-CM | POA: Diagnosis not present

## 2017-04-06 DIAGNOSIS — I482 Chronic atrial fibrillation, unspecified: Secondary | ICD-10-CM

## 2017-04-06 DIAGNOSIS — I059 Rheumatic mitral valve disease, unspecified: Secondary | ICD-10-CM | POA: Diagnosis not present

## 2017-04-06 DIAGNOSIS — I4891 Unspecified atrial fibrillation: Secondary | ICD-10-CM | POA: Diagnosis not present

## 2017-04-06 LAB — POCT INR: INR: 2.9

## 2017-04-17 ENCOUNTER — Other Ambulatory Visit: Payer: Self-pay | Admitting: Pharmacist Clinician (PhC)/ Clinical Pharmacy Specialist

## 2017-04-17 MED ORDER — WARFARIN SODIUM 5 MG PO TABS: ORAL_TABLET | ORAL | 1 refills | Status: DC

## 2017-04-21 ENCOUNTER — Other Ambulatory Visit: Payer: Self-pay | Admitting: *Deleted

## 2017-04-21 MED ORDER — WARFARIN SODIUM 5 MG PO TABS: ORAL_TABLET | ORAL | 1 refills | Status: DC

## 2017-04-21 NOTE — Telephone Encounter (Signed)
Refill Request.  

## 2017-05-04 ENCOUNTER — Ambulatory Visit (INDEPENDENT_AMBULATORY_CARE_PROVIDER_SITE_OTHER): Payer: Medicare Other

## 2017-05-04 DIAGNOSIS — Z5181 Encounter for therapeutic drug level monitoring: Secondary | ICD-10-CM | POA: Diagnosis not present

## 2017-05-04 DIAGNOSIS — I4891 Unspecified atrial fibrillation: Secondary | ICD-10-CM

## 2017-05-04 DIAGNOSIS — Z9889 Other specified postprocedural states: Secondary | ICD-10-CM | POA: Diagnosis not present

## 2017-05-04 DIAGNOSIS — I482 Chronic atrial fibrillation, unspecified: Secondary | ICD-10-CM

## 2017-05-04 DIAGNOSIS — I059 Rheumatic mitral valve disease, unspecified: Secondary | ICD-10-CM

## 2017-05-04 LAB — POCT INR: INR: 3.7

## 2017-05-08 ENCOUNTER — Other Ambulatory Visit: Payer: Self-pay | Admitting: Family Medicine

## 2017-05-08 NOTE — Telephone Encounter (Signed)
Pt. Called requesting  Refill on acyclovir  [facility]. Pt called back # is  [phone]

## 2017-05-25 ENCOUNTER — Ambulatory Visit (INDEPENDENT_AMBULATORY_CARE_PROVIDER_SITE_OTHER): Payer: Medicare Other

## 2017-05-25 DIAGNOSIS — I482 Chronic atrial fibrillation, unspecified: Secondary | ICD-10-CM

## 2017-05-25 DIAGNOSIS — I059 Rheumatic mitral valve disease, unspecified: Secondary | ICD-10-CM

## 2017-05-25 DIAGNOSIS — I4891 Unspecified atrial fibrillation: Secondary | ICD-10-CM | POA: Diagnosis not present

## 2017-05-25 DIAGNOSIS — Z5181 Encounter for therapeutic drug level monitoring: Secondary | ICD-10-CM

## 2017-05-25 DIAGNOSIS — Z9889 Other specified postprocedural states: Secondary | ICD-10-CM | POA: Diagnosis not present

## 2017-05-25 LAB — POCT INR: INR: 3.1

## 2017-05-25 NOTE — Patient Instructions (Signed)
Please continue taking 1 tablet everyday except 1/2 tablet on Sundays and Thursdays.   Recheck INR in 4 weeks.

## 2017-05-26 ENCOUNTER — Ambulatory Visit (INDEPENDENT_AMBULATORY_CARE_PROVIDER_SITE_OTHER): Payer: Medicare Other

## 2017-05-26 ENCOUNTER — Other Ambulatory Visit: Payer: Medicare Other

## 2017-05-26 VITALS — BP 162/82 | HR 64 | Temp 98.0°F | Resp 16 | Ht 63.0 in | Wt 188.2 lb

## 2017-05-26 DIAGNOSIS — I10 Essential (primary) hypertension: Secondary | ICD-10-CM

## 2017-05-26 DIAGNOSIS — E042 Nontoxic multinodular goiter: Secondary | ICD-10-CM

## 2017-05-26 DIAGNOSIS — R799 Abnormal finding of blood chemistry, unspecified: Secondary | ICD-10-CM

## 2017-05-26 DIAGNOSIS — Z862 Personal history of diseases of the blood and blood-forming organs and certain disorders involving the immune mechanism: Secondary | ICD-10-CM | POA: Diagnosis not present

## 2017-05-26 DIAGNOSIS — E559 Vitamin D deficiency, unspecified: Secondary | ICD-10-CM

## 2017-05-26 DIAGNOSIS — Z0000 Encounter for general adult medical examination without abnormal findings: Secondary | ICD-10-CM

## 2017-05-26 DIAGNOSIS — E7800 Pure hypercholesterolemia, unspecified: Secondary | ICD-10-CM | POA: Diagnosis not present

## 2017-05-26 DIAGNOSIS — R7309 Other abnormal glucose: Secondary | ICD-10-CM

## 2017-05-26 NOTE — Progress Notes (Signed)
Subjective:   [name] is a 61 y.o. female who presents for an Initial Medicare Annual Wellness Visit.  Review of Systems     Cardiac Risk Factors include: hypertension;obesity (BMI >30kg/m2)     Objective:    Today's Vitals   05/26/17 0845  BP: (!) 162/82  Pulse: 64  Resp: 16  Temp: 98 F (36.7 C)  TempSrc: Oral  Weight: 188 lb 3.2 oz (85.4 kg)  Height: 5\' 3"  (1.6 m)   Body mass index is 33.34 kg/m.  Advanced Directives 05/26/2017 01/23/2017 11/03/2016 10/29/2016 04/26/2014 02/07/2013 11/05/2012  Does Patient Have a Medical Advance Directive? Yes No No No No Patient does not have advance directive;Patient would not like information Patient does not have advance directive  Does patient want to make changes to medical advance directive? Yes (MAU/Ambulatory/Procedural Areas - Information given) - - - - - -  Would patient like information on creating a medical advance directive? - No - Patient declined No - Patient declined - - - -  Pre-existing out of facility DNR order (yellow form or pink MOST form) - - - - - - No    Current Medications (verified) Outpatient Encounter Medications as of 05/26/2017  Medication Sig  . alendronate (FOSAMAX) 35 MG tablet Take 1 tablet (35 mg total) by mouth every 7 (seven) days. Take with a full glass of water on an empty stomach.  Marland Kitchen amitriptyline (ELAVIL) 50 MG tablet Take 50 mg by mouth at bedtime as needed for sleep.   Marland Kitchen aspirin EC 81 MG tablet Take 1 tablet (81 mg total) by mouth daily.  Marland Kitchen atorvastatin (LIPITOR) 40 MG tablet Take 1 tablet (40 mg total) by mouth every evening.  . calcium carbonate (OSCAL) 1500 (600 Ca) MG TABS tablet Take 1 tablet by mouth daily.   . cetirizine (ZYRTEC) 10 MG tablet Take 1 tablet (10 mg total) by mouth daily.  . Cholecalciferol (VITAMIN D3) 50000 units CAPS Take 50,000 Units by mouth once a week. For 12 weeks, then switch to OTC Vitamin D3 2,000 unit daily (Patient taking differently: Take 50,000 Units by  mouth every Wednesday. For 12 weeks, then switch to OTC Vitamin D3 2,000 unit daily)  . cycloSPORINE (RESTASIS) 0.05 % ophthalmic emulsion Place 1 drop into both eyes 2 (two) times daily as needed (dry eyes).  Marland Kitchen dicyclomine (BENTYL) 10 MG capsule Take 1 capsule (10 mg total) by mouth 4 (four) times daily -  before meals and at bedtime. (Patient taking differently: Take 10 mg by mouth at bedtime. )  . fluticasone (FLONASE) 50 MCG/ACT nasal spray Place 2 sprays into both nostrils daily. (Patient taking differently: Place 2 sprays into both nostrils daily as needed for allergies or rhinitis. )  . ibuprofen (ADVIL,MOTRIN) 200 MG tablet Take 200 mg by mouth 2 (two) times daily as needed for headache or moderate pain.  Marland Kitchen lisinopril (PRINIVIL,ZESTRIL) 40 MG tablet Take 1 tablet (40 mg total) by mouth daily. (Patient taking differently: Take 40 mg by mouth every evening. )  . metoprolol succinate (TOPROL-XL) 100 MG 24 hr tablet Take 1 tablet (100 mg total) by mouth daily. Take with or immediately following a meal. (Patient taking differently: Take 100 mg by mouth every morning. Take with or immediately following a meal.)  . Multiple Vitamins-Minerals (CENTRUM SILVER ADULT 50+ PO) Take 1 tablet by mouth daily.  Marland Kitchen warfarin (COUMADIN) 5 MG tablet Take 1/2 to 1 tablet by mouth daily as directed by coumadin clinic  .  ipratropium (ATROVENT) 0.06 % nasal spray Place 2 sprays into both nostrils 4 (four) times daily. For up to 5-7 days then stop. (Patient not taking: Reported on 05/26/2017)  . [DISCONTINUED] amoxicillin (AMOXIL) 875 MG tablet   . [DISCONTINUED] HYDROcodone-acetaminophen (NORCO/VICODIN) 5-325 MG tablet    No facility-administered encounter medications on file as of 05/26/2017.     Allergies (verified) Patient has no known allergies.   History: Past Medical History:  Diagnosis Date  . Anticoagulated on warfarin   . Arthritis   . Chronic atrial fibrillation ([place])    SINCE 1997  . Dysrhythmia     . Goiter    multinodular  . Heart murmur   . History of hyperthyroidism    2003--  PTU [place]  . HTN (hypertension)    unspecified  . Hypercholesterolemia   . Hyperlipidemia   . Hyperthyroidism    H/O  . Pulmonary hypertension, mild ([place])   . Rheumatic heart disease    CARDIOLOGIST-- DR Marcello Moores [facility]  . S/P MVR (mitral valve replacement)    1997  [facility]--  SECONDARY TO SEVERE MV STENOSIS   Past Surgical History:  Procedure Laterality Date  . ANTERIOR CERVICAL DECOMP/DISCECTOMY FUSION  12-21-2001   C5 --  C6  . BREAST BIOPSY Left 11/03/2016   Procedure: BREAST BIOPSY WITH NEEDLE LOCALIZATION;  Surgeon: Robert Bellow, MD;  Location: [facility] ORS;  Service: General;  Laterality: Left;  . BREAST EXCISIONAL BIOPSY Left 11/03/2016   excision of calcs. Path pending  . CARDIAC CATHETERIZATION  12-28-2006  [facility]   NORMAL CORONARY ARTERIES  . CARPAL TUNNEL RELEASE Right 01-20-2007  . COLONOSCOPY  2015  . ERCP  08/15/2011   Procedure: ENDOSCOPIC RETROGRADE CHOLANGIOPANCREATOGRAPHY (ERCP);  Surgeon: Jeryl Columbia, MD;  Location: [facility] ENDOSCOPY;  Service: Endoscopy;  Laterality: N/A;  . ERCP     MULTIPLE--  INCLUDING STENTS, SPHINTEROTOMY'S  STONE REMOVAL   . FEMUR IM NAIL Right 04/26/2014   Procedure: INTRAMEDULLARY (IM) NAIL FEMORAL;  Surgeon: Alta Corning, MD;  Location: [place];  Service: Orthopedics;  Laterality: Right;  . I&D EXTREMITY Right 02/07/2013   Procedure: IRRIGATION AND DEBRIDEMENT OF RIGHT LOWER LEG WITH PLACEMENT OF ACELL AND WOULND VAC;  Surgeon: Theodoro Kos, DO;  Location: [place];  Service: Plastics;  Laterality: Right;  . LAPAROSCOPIC CHOLECYSTECTOMY  04-08-2002  . MITRAL VALVE REPLACEMENT  1997   [facility]  . ORIF ANKLE FRACTURE Right 11/05/2012   Procedure: OPEN REDUCTION INTERNAL FIXATION (ORIF) ANKLE FRACTURE only operating on right;  Surgeon: Alta Corning, MD;  Location: [place];  Service: Orthopedics;  Laterality: Right;  . ORIF RIGHT ULNAR FX W/  ILIAC CREST BONE GRAFT  10-06-2008  . ORIF WRIST FRACTURE Right 04/26/2014   Procedure: OPEN REDUCTION INTERNAL FIXATION (ORIF) WRIST FRACTURE;  Surgeon: Alta Corning, MD;  Location: [place];  Service: Orthopedics;  Laterality: Right;  . TRANSTHORACIC ECHOCARDIOGRAM  06-29-2009  dr wall   normal lvsf/ ef 55-60%/  normal bileaflet mechanical mvr/ moderated dilated la/ moderate tr  . WRIST ARTHROSCOPY Right 12-10-2007   debridement and ulnar shortening osteotomy w/ plate   Family History  Problem Relation Age of Onset  . Hypothyroidism Sister   . Gallbladder disease Sister   . Coronary artery disease Unknown   . Diabetes Mother   . Hypertension Mother   . Heart disease Father   . Stroke Father   . Breast cancer Neg Hx    Social History  Socioeconomic History  . Marital status: Divorced    Spouse name: None  . Number of children: None  . Years of education: 69  . Highest education level: 12th grade  Social Needs  . Financial resource strain: Not very hard  . Food insecurity - worry: Never true  . Food insecurity - inability: Never true  . Transportation needs - medical: No  . Transportation needs - non-medical: No  Occupational History  . Occupation: Control and instrumentation engineer  Tobacco Use  . Smoking status: Never Smoker  . Smokeless tobacco: Never Used  Substance and Sexual Activity  . Alcohol use: No  . Drug use: No  . Sexual activity: None  Other Topics Concern  . None  Social History Narrative  . None    Tobacco Counseling Counseling given: No   Clinical Intake:  Pre-visit preparation completed: Yes  Pain : No/denies pain     Nutritional Status: BMI > 30  Obese Nutritional Risks: None Diabetes: No  Activities of Daily Living: Independent Ambulation: Independent with device- listed below Home Assistive Devices/Equipment: Eyeglasses Medication Administration: Independent Home Management: Independent  Barriers to Care Management & Learning: None  Do you  feel unsafe in your current relationship?: No(single) Do you feel physically threatened by others?: No Anyone hurting you at home, work, or school?: No Unable to ask?: No  How often do you need to have someone help you when you read instructions, pamphlets, or other written materials from your doctor or pharmacy?: 1 - Never What is the last grade level you completed in school?: 12th grade  Interpreter Needed?: No  Information entered by :: Tiffany Hill,LPN   Activities of Daily Living In your present state of health, do you have any difficulty performing the following activities: 05/26/2017 10/29/2016  Hearing? N N  Vision? N N  Difficulty concentrating or making decisions? N N  Walking or climbing stairs? Y N  Comment leg pain  -  Dressing or bathing? N N  Doing errands, shopping? N N  Preparing Food and eating ? N -  Using the Toilet? N -  In the past six months, have you accidently leaked urine? N -  Do you have problems with loss of bowel control? N -  Managing your Medications? N -  Managing your Finances? N -  Housekeeping or managing your Housekeeping? N -  Some recent data might be hidden    Timed Get Up and Go Performed 7 seconds with no assistive devices  Immunizations and Health Maintenance Immunization History  Administered Date(s) Administered  . Influenza-Unspecified 03/17/2016  . Zoster 04/29/2016   There are no preventive care reminders to display for this patient.  Patient Care Team: Olin Hauser, DO as PCP - General (Family Medicine) Renato Shin, MD as Attending Physician (Internal Medicine) Bary Castilla Forest Gleason, MD as Consulting Physician (General Surgery) Lucilla Lame, MD as Consulting Physician (Gastroenterology) Stanford Breed Denice Bors, MD as Consulting Physician (Cardiology)  Indicate any recent Medical Services you may have received from other than [facility] providers in the past year (date may be approximate).     Assessment:   This is a  routine wellness examination for [place].   Hearing/Vision screen Vision Screening Comments: Goes to eye Doctor in [place]   Dietary issues and exercise activities discussed: Current Exercise Habits: Home exercise routine, Time (Minutes): 60, Frequency (Times/Week): 1, Weekly Exercise (Minutes/Week): 60, Intensity: Mild, Exercise limited by: None identified  Goals    . DIET - INCREASE WATER INTAKE  Recommend drinking at least 6-8 glasses of water a day       Depression Screen PHQ 2/9 Scores 05/26/2017 01/27/2017 12/04/2015 10/02/2015 10/02/2015  PHQ - 2 Score 0 0 0 0 0  PHQ- 9 Score - - - 3 -    Fall Risk Fall Risk  05/26/2017 01/27/2017 12/04/2015 10/02/2015  Falls in the past year? No No No No    Is the patient's home free of loose throw rugs in walkways, pet beds, electrical cords, etc?   yes      Grab bars in the bathroom? yes      Handrails on the stairs?   yes      Adequate lighting?   yes  Cognitive Function:     6CIT Screen 05/26/2017  What Year? 0 points  What month? 0 points  What time? 0 points  Count back from 20 0 points  Months in reverse 0 points  Repeat phrase 2 points  Total Score 2    Screening Tests Health Maintenance  Topic Date Due  . MAMMOGRAM  10/15/2018  . PAP SMEAR  06/04/2019  . COLONOSCOPY  02/05/2021  . TETANUS/TDAP  04/26/2024  . INFLUENZA VACCINE  Completed  . Hepatitis C Screening  Completed  . HIV Screening  Completed    Cancer Screenings: Lung:  Low Dose CT Chest recommended if Age 50-80 years, 30 pack-year currently smoking OR have quit w/in 15years. Patient does not qualify. Breast:Up to date on Mammogram? Yes  Up to date of Bone Density/Dexa? Yes Colorectal: completed 02/06/2016  Additional Screenings:  Hepatitis B/HIV/Syphillis: hiv completed 06/03/2016, hep b and syphillis not indicated  Hepatitis C Screening: completed 04/10/2016     Plan:     I have personally reviewed and addressed the Medicare Annual Wellness  questionnaire and have noted the following in the patient's chart:  A. Medical and social history B. Use of alcohol, tobacco or illicit drugs  C. Current medications and supplements D. Functional ability and status E.  Nutritional status F.  Physical activity G. Advance directives H. List of other physicians I.  Hospitalizations, surgeries, and ER visits in previous 12 months J.  [place] such as hearing and vision if needed, cognitive and depression L. Referrals and appointments   In addition, I have reviewed and discussed with patient certain preventive protocols, quality metrics, and best practice recommendations. A written personalized care plan for preventive services as well as general preventive health recommendations were provided to patient.   Signed,  Tyler Aas, LPN Nurse Health Advisor   Nurse Notes:none

## 2017-05-26 NOTE — Patient Instructions (Addendum)
[name] , Thank you for taking time to come for your Medicare Wellness Visit. I appreciate your ongoing commitment to your health goals. Please review the following plan we discussed and let me know if I can assist you in the future.   Screening recommendations/referrals: Colonoscopy: completed 02/06/2016 Mammogram: completed 11/03/2016 Bone Density: completed 12/28/2015 Recommended yearly ophthalmology/optometry visit for glaucoma screening and checkup Recommended yearly dental visit for hygiene and checkup  Vaccinations: Influenza vaccine: up to date Pneumococcal vaccine: due at age 79 Tdap vaccine: up to date  Shingles vaccine: up to date   Advanced directives: Advance directive discussed with you today. I have provided a copy for you to complete at home and have notarized. Once this is complete please bring a copy in to our office so we can scan it into your chart.  Conditions/risks identified: Recommend drinking at least 6-8 glasses of water a day   Next appointment: Follow up on 1212/2018 at  9:00a with [place]. Follow up in one year for your annual wellness exam.   Preventive Care 40-64 Years, Female Preventive care refers to lifestyle choices and visits with your health care provider that can promote health and wellness. What does preventive care include?  A yearly physical exam. This is also called an annual well check.  Dental exams once or twice a year.  Routine eye exams. Ask your health care provider how often you should have your eyes checked.  Personal lifestyle choices, including:  Daily care of your teeth and gums.  Regular physical activity.  Eating a healthy diet.  Avoiding tobacco and drug use.  Limiting alcohol use.  Practicing safe sex.  Taking low-dose aspirin daily starting at age 34.  Taking vitamin and mineral supplements as recommended by your health care provider. What happens during an annual well check? The services and screenings  done by your health care provider during your annual well check will depend on your age, overall health, lifestyle risk factors, and family history of disease. Counseling  Your health care provider may ask you questions about your:  Alcohol use.  Tobacco use.  Drug use.  Emotional well-being.  Home and relationship well-being.  Sexual activity.  Eating habits.  Work and work Statistician.  Method of birth control.  Menstrual cycle.  Pregnancy history. Screening  You may have the following tests or measurements:  Height, weight, and BMI.  Blood pressure.  Lipid and cholesterol levels. These may be checked every 5 years, or more frequently if you are over 82 years old.  Skin check.  Lung cancer screening. You may have this screening every year starting at age 49 if you have a 30-pack-year history of smoking and currently smoke or have quit within the past 15 years.  Fecal occult blood test (FOBT) of the stool. You may have this test every year starting at age 82.  Flexible sigmoidoscopy or colonoscopy. You may have a sigmoidoscopy every 5 years or a colonoscopy every 10 years starting at age 78.  Hepatitis C blood test.  Hepatitis B blood test.  Sexually transmitted disease (STD) testing.  Diabetes screening. This is done by checking your blood sugar (glucose) after you have not eaten for a while (fasting). You may have this done every 1-3 years.  Mammogram. This may be done every 1-2 years. Talk to your health care provider about when you should start having regular mammograms. This may depend on whether you have a family history of breast cancer.  BRCA-related cancer screening. This  may be done if you have a family history of breast, ovarian, tubal, or peritoneal cancers.  Pelvic exam and Pap test. This may be done every 3 years starting at age 60. Starting at age 24, this may be done every 5 years if you have a Pap test in combination with an HPV test.  Bone  density scan. This is done to screen for osteoporosis. You may have this scan if you are at high risk for osteoporosis. Discuss your test results, treatment options, and if necessary, the need for more tests with your health care provider. Vaccines  Your health care provider may recommend certain vaccines, such as:  Influenza vaccine. This is recommended every year.  Tetanus, diphtheria, and acellular pertussis (Tdap, Td) vaccine. You may need a Td booster every 10 years.  Zoster vaccine. You may need this after age 12.  Pneumococcal 13-valent conjugate (PCV13) vaccine. You may need this if you have certain conditions and were not previously vaccinated.  Pneumococcal polysaccharide (PPSV23) vaccine. You may need one or two doses if you smoke cigarettes or if you have certain conditions. Talk to your health care provider about which screenings and vaccines you need and how often you need them. This information is not intended to replace advice given to you by your health care provider. Make sure you discuss any questions you have with your health care provider. Document Released: 07/06/2015 Document Revised: 02/27/2016 Document Reviewed: 04/10/2015 Elsevier Interactive Patient Education  2017 [employer] Prevention in the Home Falls can cause injuries. They can happen to people of all ages. There are many things you can do to make your home safe and to help prevent falls. What can I do on the outside of my home?  Regularly fix the edges of walkways and driveways and fix any cracks.  Remove anything that might make you trip as you walk through a door, such as a raised step or threshold.  Trim any bushes or trees on the path to your home.  Use bright outdoor lighting.  Clear any walking paths of anything that might make someone trip, such as rocks or tools.  Regularly check to see if handrails are loose or broken. Make sure that both sides of any steps have  handrails.  Any raised decks and porches should have guardrails on the edges.  Have any leaves, snow, or ice cleared regularly.  Use sand or salt on walking paths during winter.  Clean up any spills in your garage right away. This includes oil or grease spills. What can I do in the bathroom?  Use night lights.  Install grab bars by the toilet and in the tub and shower. Do not use towel bars as grab bars.  Use non-skid mats or decals in the tub or shower.  If you need to sit down in the shower, use a plastic, non-slip stool.  Keep the floor dry. Clean up any water that spills on the floor as soon as it happens.  Remove soap buildup in the tub or shower regularly.  Attach bath mats securely with double-sided non-slip rug tape.  Do not have throw rugs and other things on the floor that can make you trip. What can I do in the bedroom?  Use night lights.  Make sure that you have a light by your bed that is easy to reach.  Do not use any sheets or blankets that are too big for your bed. They should not hang down  onto the floor.  Have a firm chair that has side arms. You can use this for support while you get dressed.  Do not have throw rugs and other things on the floor that can make you trip. What can I do in the kitchen?  Clean up any spills right away.  Avoid walking on wet floors.  Keep items that you use a lot in easy-to-reach places.  If you need to reach something above you, use a strong step stool that has a grab bar.  Keep electrical cords out of the way.  Do not use floor polish or wax that makes floors slippery. If you must use wax, use non-skid floor wax.  Do not have throw rugs and other things on the floor that can make you trip. What can I do with my stairs?  Do not leave any items on the stairs.  Make sure that there are handrails on both sides of the stairs and use them. Fix handrails that are broken or loose. Make sure that handrails are as long as  the stairways.  Check any carpeting to make sure that it is firmly attached to the stairs. Fix any carpet that is loose or worn.  Avoid having throw rugs at the top or bottom of the stairs. If you do have throw rugs, attach them to the floor with carpet tape.  Make sure that you have a light switch at the top of the stairs and the bottom of the stairs. If you do not have them, ask someone to add them for you. What else can I do to help prevent falls?  Wear shoes that:  Do not have high heels.  Have rubber bottoms.  Are comfortable and fit you well.  Are closed at the toe. Do not wear sandals.  If you use a stepladder:  Make sure that it is fully opened. Do not climb a closed stepladder.  Make sure that both sides of the stepladder are locked into place.  Ask someone to hold it for you, if possible.  Clearly mark and make sure that you can see:  Any grab bars or handrails.  First and last steps.  Where the edge of each step is.  Use tools that help you move around (mobility aids) if they are needed. These include:  Canes.  Walkers.  Scooters.  Crutches.  Turn on the lights when you go into a dark area. Replace any light bulbs as soon as they burn out.  Set up your furniture so you have a clear path. Avoid moving your furniture around.  If any of your floors are uneven, fix them.  If there are any pets around you, be aware of where they are.  Review your medicines with your doctor. Some medicines can make you feel dizzy. This can increase your chance of falling. Ask your doctor what other things that you can do to help prevent falls. This information is not intended to replace advice given to you by your health care provider. Make sure you discuss any questions you have with your health care provider. Document Released: 04/05/2009 Document Revised: 11/15/2015 Document Reviewed: 07/14/2014 Elsevier Interactive Patient Education  2017 [employer].

## 2017-05-27 ENCOUNTER — Other Ambulatory Visit: Payer: Self-pay

## 2017-05-27 DIAGNOSIS — J0100 Acute maxillary sinusitis, unspecified: Secondary | ICD-10-CM

## 2017-05-27 LAB — CBC WITH DIFFERENTIAL/PLATELET
BASOS ABS: 48 {cells}/uL (ref 0–200)
Basophils Relative: 0.9 %
EOS ABS: 80 {cells}/uL (ref 15–500)
Eosinophils Relative: 1.5 %
HCT: 37 % (ref 35.0–45.0)
Hemoglobin: 12.1 g/dL (ref 11.7–15.5)
Lymphs Abs: 1256 cells/uL (ref 850–3900)
MCH: 27.6 pg (ref 27.0–33.0)
MCHC: 32.7 g/dL (ref 32.0–36.0)
MCV: 84.5 fL (ref 80.0–100.0)
MONOS PCT: 7.2 %
MPV: 12.8 fL — AB (ref 7.5–12.5)
NEUTROS ABS: 3535 {cells}/uL (ref 1500–7800)
NEUTROS PCT: 66.7 %
PLATELETS: 164 10*3/uL (ref 140–400)
RBC: 4.38 10*6/uL (ref 3.80–5.10)
RDW: 13.7 % (ref 11.0–15.0)
TOTAL LYMPHOCYTE: 23.7 %
WBC: 5.3 10*3/uL (ref 3.8–10.8)
WBCMIX: 382 {cells}/uL (ref 200–950)

## 2017-05-27 LAB — COMPLETE METABOLIC PANEL WITH GFR
AG RATIO: 1 (calc) (ref 1.0–2.5)
ALKALINE PHOSPHATASE (APISO): 97 U/L (ref 33–130)
ALT: 12 U/L (ref 6–29)
AST: 18 U/L (ref 10–35)
Albumin: 3.5 g/dL — ABNORMAL LOW (ref 3.6–5.1)
BILIRUBIN TOTAL: 0.7 mg/dL (ref 0.2–1.2)
BUN: 12 mg/dL (ref 7–25)
CHLORIDE: 104 mmol/L (ref 98–110)
CO2: 31 mmol/L (ref 20–32)
Calcium: 9.1 mg/dL (ref 8.6–10.4)
Creat: 0.89 mg/dL (ref 0.50–0.99)
GFR, Est African American: 80 mL/min/{1.73_m2} (ref >=60)
GFR, Est Non African American: 69 mL/min/{1.73_m2} (ref >=60)
GLUCOSE: 102 mg/dL (ref 65–139)
Globulin: 3.6 g/dL (calc) (ref 1.9–3.7)
POTASSIUM: 4.4 mmol/L (ref 3.5–5.3)
Sodium: 141 mmol/L (ref 135–146)
Total Protein: 7.1 g/dL (ref 6.1–8.1)

## 2017-05-27 LAB — TSH: TSH: 0.59 m[IU]/L (ref 0.40–4.50)

## 2017-05-27 LAB — LIPID PANEL
CHOLESTEROL: 150 mg/dL (ref <200)
HDL: 56 mg/dL (ref >50)
LDL Cholesterol (Calc): 75 mg/dL (calc)
NON-HDL CHOLESTEROL (CALC): 94 mg/dL (ref <130)
TRIGLYCERIDES: 100 mg/dL (ref <150)
Total CHOL/HDL Ratio: 2.7 (calc) (ref <5.0)

## 2017-05-27 LAB — HEMOGLOBIN A1C
Hgb A1c MFr Bld: 5.5 % of total Hgb (ref <5.7)
Mean Plasma Glucose: 111 (calc)
eAG (mmol/L): 6.2 (calc)

## 2017-05-27 LAB — VITAMIN D 25 HYDROXY (VIT D DEFICIENCY, FRACTURES): VIT D 25 HYDROXY: 35 ng/mL (ref 30–100)

## 2017-05-27 MED ORDER — FLUTICASONE PROPIONATE 50 MCG/ACT NA SUSP: 2.0000 | Freq: Every day | NASAL | 11 refills | Status: DC

## 2017-06-03 ENCOUNTER — Encounter: Payer: Medicaid Other | Admitting: Family Medicine

## 2017-06-18 ENCOUNTER — Ambulatory Visit (INDEPENDENT_AMBULATORY_CARE_PROVIDER_SITE_OTHER): Payer: Medicare Other | Admitting: Family Medicine

## 2017-06-18 ENCOUNTER — Encounter: Payer: Self-pay | Admitting: Family Medicine

## 2017-06-18 VITALS — BP 130/80 | HR 87 | Resp 15 | Ht 63.0 in | Wt 195.0 lb

## 2017-06-18 DIAGNOSIS — M8589 Other specified disorders of bone density and structure, multiple sites: Secondary | ICD-10-CM | POA: Diagnosis not present

## 2017-06-18 DIAGNOSIS — Z0000 Encounter for general adult medical examination without abnormal findings: Secondary | ICD-10-CM

## 2017-06-18 DIAGNOSIS — J3089 Other allergic rhinitis: Secondary | ICD-10-CM

## 2017-06-18 DIAGNOSIS — Z7901 Long term (current) use of anticoagulants: Secondary | ICD-10-CM

## 2017-06-18 DIAGNOSIS — E559 Vitamin D deficiency, unspecified: Secondary | ICD-10-CM

## 2017-06-18 DIAGNOSIS — Z5181 Encounter for therapeutic drug level monitoring: Secondary | ICD-10-CM | POA: Diagnosis not present

## 2017-06-18 DIAGNOSIS — Z8719 Personal history of other diseases of the digestive system: Secondary | ICD-10-CM

## 2017-06-18 DIAGNOSIS — I10 Essential (primary) hypertension: Secondary | ICD-10-CM | POA: Diagnosis not present

## 2017-06-18 DIAGNOSIS — I482 Chronic atrial fibrillation, unspecified: Secondary | ICD-10-CM

## 2017-06-18 DIAGNOSIS — R7309 Other abnormal glucose: Secondary | ICD-10-CM | POA: Insufficient documentation

## 2017-06-18 DIAGNOSIS — Z87898 Personal history of other specified conditions: Secondary | ICD-10-CM

## 2017-06-18 MED ORDER — IPRATROPIUM BROMIDE 0.06 % NA SOLN: 2.0000 | Freq: Four times a day (QID) | NASAL | 0 refills | Status: DC

## 2017-06-18 MED ORDER — VITAMIN D3 50 MCG (2000 UT) PO CAPS: 2000.0000 [IU] | ORAL_CAPSULE | Freq: Every day | ORAL | Status: AC

## 2017-06-18 NOTE — Progress Notes (Signed)
Subjective:    Patient ID: [name], female    DOB: 12-09-1955, 61 y.o.   MRN: [id number]  [name] is a 61 y.o. female presenting on 06/18/2017 for Annual Exam   HPI   Here for Annual Physical and Lab Review  Specialists: Cardiology - Dr Stanford Breed ([facility] [place]) / [facility] Cards Endocrinology - Dr Renato Shin [facility] Endocrinology) Gastroenterology - Dr Allen Norris ([facility])  Chronic Atrial Fibrillation / HTN Reports doing well.  Continues on Lisinopril and Metoprolol XL Followed by Cardiology  Elevated A1c - No prior history of Pre-Diabetes or Diabetes. Last yearly labs with A1c 5.5. In past had 4.4. - Today she attributes this increase in blood sugar to dietary habits, mainly increased eating chocolate regularly. No significant change in her exercise, still limited with history of lower extremity injuries  History of Insomnia - Reports chronic problem does not need as much sleep as usual. Has taken Elavil PRN for sleep. - No new concerns today.  History of Epigastric Pain / S/p cholecystectomy - Reports prior history of cholelithiasis and problem with gallbladder in past, is s/p cholecystectomy 2003. However has had episodic epigastric pain rarely, usually 1x every 4-6 months, in past quick onset sweating pain, wake her up, resolves within 24-48 hours. She has had other evaluation before, including imaging last CT 07/2011 showed some chronic inflammatory changes s/p choley affecting bile duct. She has seen surgery and GI in past. No further intervention. She was treated with Dicyclomine PRN for abdominal discomfort related to possible IBS with some relief.  Osteopenia / Vitamin D Deficiency: - Last DEXA was 09/2015 dx osteopenia, I started her in 05/2016 with treatment on Fosamax 35mg  weekly for prevention / osteopenia, see prior notes for background information. - Today patient reports doing well on Fosamax, tolerating well without complaints - No new  fracture or injury or concerns. Prior fracture concerns with hip and ankles in past. - She is due for repeat DEXA within 1-2 years of last result, she would like to re-check this in April 2019 - She is treated with Calcium supplement daily - With regards to Vitamin D deficiency, she has responded well to 50k daily in past, and now last Vitamin D check showed still normal but >30. She is taking Vitamin D3 2,000 iu daily for maintenance  Chronic Seasonal and Environmental Allergies / Alleric Rhinosinusitis - Reports doing well currently, has some congestion but improved. Taking Loratadine and Flonase, does not need refills today. - Would like refill on Atrovent for congestion - Denies symptoms of sinus infection  Health Maintenance: Breast CA Screening: Last mammogram result 09/2016 Bi-rads 4 suspicious, done at [facility], proceeded to core biopsy, see path results below, showed negative malignancy. No known family history of breast cancer. Currently asymptomatic.  UTD Colonoscopy 2017  UTD Flu Vaccine 02/21/17  UTD Routine Hep C and HIV screen  11/03/16 A. LEFT BREAST, UPPER OUTER QUADRANT; NEEDLE LOCALIZED EXCISION:  - COLUMNAR CELL LESION WITH MICROCALCIFICATIONS.  - PSEUDO-ANGIOMATOUS STROMAL HYPERPLASIA.  - CHRONIC INFLAMMATION.  - NEGATIVE FOR ATYPIA AND MALIGNANCY.   Depression screen [facility] 2/9 05/26/2017 01/27/2017 12/04/2015  Decreased Interest 0 0 0  Down, Depressed, Hopeless 0 0 0  PHQ - 2 Score 0 0 0  Altered sleeping - - -  Tired, decreased energy - - -  Change in appetite - - -  Feeling bad or failure about yourself  - - -  Trouble concentrating - - -  Moving slowly  or fidgety/restless - - -  Suicidal thoughts - - -  PHQ-9 Score - - -  Difficult doing work/chores - - -  Some recent data might be hidden    Past Medical History:  Diagnosis Date  . Anticoagulated on warfarin   . Arthritis   . Chronic atrial fibrillation ([place])    SINCE 1997  . Dysrhythmia   .  Goiter    multinodular  . Heart murmur   . History of hyperthyroidism    2003--  PTU [place]  . Hypercholesterolemia   . Hyperlipidemia   . Hyperthyroidism    H/O  . Pulmonary hypertension, mild ([place])   . Rheumatic heart disease    CARDIOLOGIST-- DR Marcello Moores [facility]  . S/P MVR (mitral valve replacement)    1997  [facility]--  SECONDARY TO SEVERE MV STENOSIS   Past Surgical History:  Procedure Laterality Date  . ANTERIOR CERVICAL DECOMP/DISCECTOMY FUSION  12-21-2001   C5 --  C6  . BREAST BIOPSY Left 11/03/2016   Procedure: BREAST BIOPSY WITH NEEDLE LOCALIZATION;  Surgeon: Robert Bellow, MD;  Location: [facility] ORS;  Service: General;  Laterality: Left;  . BREAST EXCISIONAL BIOPSY Left 11/03/2016   excision of calcs. Path pending  . CARDIAC CATHETERIZATION  12-28-2006  DR [facility]   NORMAL CORONARY ARTERIES  . CARPAL TUNNEL RELEASE Right 01-20-2007  . COLONOSCOPY  2015  . ERCP  08/15/2011   Procedure: ENDOSCOPIC RETROGRADE CHOLANGIOPANCREATOGRAPHY (ERCP);  Surgeon: Jeryl Columbia, MD;  Location: [facility] ENDOSCOPY;  Service: Endoscopy;  Laterality: N/A;  . ERCP     MULTIPLE--  INCLUDING STENTS, SPHINTEROTOMY'S  STONE REMOVAL   . FEMUR IM NAIL Right 04/26/2014   Procedure: INTRAMEDULLARY (IM) NAIL FEMORAL;  Surgeon: Alta Corning, MD;  Location: [place];  Service: Orthopedics;  Laterality: Right;  . I&D EXTREMITY Right 02/07/2013   Procedure: IRRIGATION AND DEBRIDEMENT OF RIGHT LOWER LEG WITH PLACEMENT OF ACELL AND WOULND VAC;  Surgeon: Theodoro Kos, DO;  Location: [place];  Service: Plastics;  Laterality: Right;  . LAPAROSCOPIC CHOLECYSTECTOMY  04-08-2002  . MITRAL VALVE REPLACEMENT  1997   [facility] [place]  . ORIF ANKLE FRACTURE Right 11/05/2012   Procedure: OPEN REDUCTION INTERNAL FIXATION (ORIF) ANKLE FRACTURE only operating on right;  Surgeon: Alta Corning, MD;  Location: [place];  Service: Orthopedics;  Laterality: Right;  . ORIF RIGHT ULNAR FX W/ ILIAC CREST BONE GRAFT  10-06-2008  . ORIF  WRIST FRACTURE Right 04/26/2014   Procedure: OPEN REDUCTION INTERNAL FIXATION (ORIF) WRIST FRACTURE;  Surgeon: Alta Corning, MD;  Location: [place];  Service: Orthopedics;  Laterality: Right;  . TRANSTHORACIC ECHOCARDIOGRAM  06-29-2009  dr wall   normal lvsf/ ef 55-60%/  normal bileaflet mechanical mvr/ moderated dilated la/ moderate tr  . WRIST ARTHROSCOPY Right 12-10-2007   debridement and ulnar shortening osteotomy w/ plate   Social History   Socioeconomic History  . Marital status: Divorced    Spouse name: Not on file  . Number of children: Not on file  . Years of education: 51  . Highest education level: 12th grade  Social Needs  . Financial resource strain: Not very hard  . Food insecurity - worry: Never true  . Food insecurity - inability: Never true  . Transportation needs - medical: No  . Transportation needs - non-medical: No  Occupational History  . Occupation: Control and instrumentation engineer  Tobacco Use  . Smoking status: Never Smoker  . Smokeless tobacco: Never Used  Substance  and Sexual Activity  . Alcohol use: No  . Drug use: No  . Sexual activity: Not on file  Other Topics Concern  . Not on file  Social History Narrative  . Not on file   Family History  Problem Relation Age of Onset  . Hypothyroidism Sister   . Gallbladder disease Sister   . Coronary artery disease Unknown   . Diabetes Mother   . Hypertension Mother   . Heart disease Father   . Stroke Father   . Breast cancer Neg Hx    Current Outpatient Medications on File Prior to Visit  Medication Sig  . alendronate (FOSAMAX) 35 MG tablet Take 1 tablet (35 mg total) by mouth every 7 (seven) days. Take with a full glass of water on an empty stomach.  Marland Kitchen amitriptyline (ELAVIL) 50 MG tablet Take 50 mg by mouth at bedtime as needed for sleep.   Marland Kitchen aspirin EC 81 MG tablet Take 1 tablet (81 mg total) by mouth daily.  Marland Kitchen atorvastatin (LIPITOR) 40 MG tablet Take 1 tablet (40 mg total) by mouth every evening.  . calcium  carbonate (OSCAL) 1500 (600 Ca) MG TABS tablet Take 1 tablet by mouth daily.   . cetirizine (ZYRTEC) 10 MG tablet Take 1 tablet (10 mg total) by mouth daily.  . cycloSPORINE (RESTASIS) 0.05 % ophthalmic emulsion Place 1 drop into both eyes 2 (two) times daily as needed (dry eyes).  Marland Kitchen dicyclomine (BENTYL) 10 MG capsule Take 1 capsule (10 mg total) by mouth 4 (four) times daily -  before meals and at bedtime. (Patient taking differently: Take 10 mg by mouth at bedtime. )  . fluticasone (FLONASE) 50 MCG/ACT nasal spray Place 2 sprays into both nostrils daily.  Marland Kitchen ibuprofen (ADVIL,MOTRIN) 200 MG tablet Take 200 mg by mouth 2 (two) times daily as needed for headache or moderate pain.  Marland Kitchen lisinopril (PRINIVIL,ZESTRIL) 40 MG tablet Take 1 tablet (40 mg total) by mouth daily. (Patient taking differently: Take 40 mg by mouth every evening. )  . metoprolol succinate (TOPROL-XL) 100 MG 24 hr tablet Take 1 tablet (100 mg total) by mouth daily. Take with or immediately following a meal. (Patient taking differently: Take 100 mg by mouth every morning. Take with or immediately following a meal.)  . Multiple Vitamins-Minerals (CENTRUM SILVER ADULT 50+ PO) Take 1 tablet by mouth daily.  Marland Kitchen warfarin (COUMADIN) 5 MG tablet Take 1/2 to 1 tablet by mouth daily as directed by coumadin clinic   No current facility-administered medications on file prior to visit.     Review of Systems  Constitutional: Negative for activity change, appetite change, chills, diaphoresis, fatigue and fever.  HENT: Positive for congestion. Negative for hearing loss, postnasal drip, rhinorrhea, sinus pressure and sinus pain.   Eyes: Negative for visual disturbance.  Respiratory: Negative for apnea, cough, choking, chest tightness, shortness of breath and wheezing.   Cardiovascular: Negative for chest pain, palpitations and leg swelling.  Gastrointestinal: Negative for abdominal pain, anal bleeding, blood in stool, constipation, diarrhea, nausea  and vomiting.  Endocrine: Negative for cold intolerance and polyuria.  Genitourinary: Negative for decreased urine volume, difficulty urinating, dysuria, frequency, hematuria and urgency.  Musculoskeletal: Negative for arthralgias, back pain, gait problem and neck pain.  Skin: Negative for rash.  Allergic/Immunologic: Positive for environmental allergies.  Neurological: Negative for dizziness, weakness, light-headedness, numbness and headaches.  Hematological: Negative for adenopathy.  Psychiatric/Behavioral: Negative for behavioral problems, dysphoric mood and sleep disturbance. The patient is not nervous/anxious.  Per HPI unless specifically indicated above     Objective:    BP 130/80 (BP Location: Left Arm, Patient Position: Sitting, Cuff Size: Normal)   Pulse 87   Resp 15   Ht 5\' 3"  (1.6 m)   Wt 195 lb (88.5 kg)   SpO2 95%   BMI 34.54 kg/m   Wt Readings from Last 3 Encounters:  06/18/17 195 lb (88.5 kg)  05/26/17 188 lb 3.2 oz (85.4 kg)  03/05/17 183 lb (83 kg)    Physical Exam  Constitutional: She is oriented to person, place, and time. She appears well-developed and well-nourished. No distress.  Well-appearing, comfortable, cooperative  HENT:  Head: Normocephalic and atraumatic.  Mouth/Throat: Oropharynx is clear and moist.  Frontal / maxillary sinuses non-tender. Nares patent without purulence or edema. Bilateral TMs clear without erythema, effusion or bulging. Oropharynx clear without erythema, exudates, edema or asymmetry.  Eyes: Conjunctivae and EOM are normal. Pupils are equal, round, and reactive to light. Right eye exhibits no discharge. Left eye exhibits no discharge.  Neck: Normal range of motion. Neck supple. No thyromegaly present.  Cardiovascular: Normal rate, normal heart sounds and intact distal pulses.  No murmur heard. Irregularly irregular rhythm. Mechanical heart valve clicking   Pulmonary/Chest: Effort normal and breath sounds normal. No respiratory  distress. She has no wheezes. She has no rales.  Abdominal: Soft. Bowel sounds are normal. She exhibits no distension and no mass. There is no tenderness. There is no rebound.  RUQ Murphy's negative. Some mild discomfort epigastric on deep breath.  Musculoskeletal: Normal range of motion. She exhibits no edema or tenderness.  Upper / Lower Extremities: - Normal muscle tone, strength bilateral upper extremities 5/5, lower extremities 5/5  Lymphadenopathy:    She has no cervical adenopathy.  Neurological: She is alert and oriented to person, place, and time.  Distal sensation intact to light touch all extremities  Skin: Skin is warm and dry. No rash noted. She is not diaphoretic. No erythema.  Psychiatric: She has a normal mood and affect. Her behavior is normal.  Well groomed, good eye contact, normal speech and thoughts  Nursing note and vitals reviewed.  Results for orders placed or performed in visit on 05/26/17  TSH  Result Value Ref Range   TSH 0.59 0.40 - 4.50 mIU/L  VITAMIN D 25 Hydroxy (Vit-D Deficiency, Fractures)  Result Value Ref Range   Vit D, 25-Hydroxy 35 30 - 100 ng/mL  Hemoglobin A1c  Result Value Ref Range   Hgb A1c MFr Bld 5.5 <5.7 % of total Hgb   Mean Plasma Glucose 111 (calc)   eAG (mmol/L) 6.2 (calc)  Lipid panel  Result Value Ref Range   Cholesterol 150 <200 mg/dL   HDL 56 >50 mg/dL   Triglycerides 100 <150 mg/dL   LDL Cholesterol (Calc) 75 mg/dL (calc)   Total CHOL/HDL Ratio 2.7 <5.0 (calc)   Non-HDL Cholesterol (Calc) 94 <130 mg/dL (calc)  CBC with Differential/Platelet  Result Value Ref Range   WBC 5.3 3.8 - 10.8 Thousand/uL   RBC 4.38 3.80 - 5.10 Million/uL   Hemoglobin 12.1 11.7 - 15.5 g/dL   HCT 37.0 35.0 - 45.0 %   MCV 84.5 80.0 - 100.0 fL   MCH 27.6 27.0 - 33.0 pg   MCHC 32.7 32.0 - 36.0 g/dL   RDW 13.7 11.0 - 15.0 %   Platelets 164 140 - 400 Thousand/uL   MPV 12.8 (H) 7.5 - 12.5 fL   Neutro Abs 3,535 1,500 -  7,800 cells/uL   Lymphs Abs  1,256 850 - 3,900 cells/uL   WBC mixed population 382 200 - 950 cells/uL   Eosinophils Absolute 80 15 - 500 cells/uL   Basophils Absolute 48 0 - 200 cells/uL   Neutrophils Relative % 66.7 %   Total Lymphocyte 23.7 %   Monocytes Relative 7.2 %   Eosinophils Relative 1.5 %   Basophils Relative 0.9 %  COMPLETE METABOLIC PANEL WITH GFR  Result Value Ref Range   Glucose, Bld 102 65 - 139 mg/dL   BUN 12 7 - 25 mg/dL   Creat 0.89 0.50 - 0.99 mg/dL   GFR, Est Non African American 69 > OR = 60 mL/min/1.21m2   GFR, Est African American 80 > OR = 60 mL/min/1.9m2   BUN/Creatinine Ratio NOT APPLICABLE 6 - 22 (calc)   Sodium 141 135 - 146 mmol/L   Potassium 4.4 3.5 - 5.3 mmol/L   Chloride 104 98 - 110 mmol/L   CO2 31 20 - 32 mmol/L   Calcium 9.1 8.6 - 10.4 mg/dL   Total Protein 7.1 6.1 - 8.1 g/dL   Albumin 3.5 (L) 3.6 - 5.1 g/dL   Globulin 3.6 1.9 - 3.7 g/dL (calc)   AG Ratio 1.0 1.0 - 2.5 (calc)   Total Bilirubin 0.7 0.2 - 1.2 mg/dL   Alkaline phosphatase (APISO) 97 33 - 130 U/L   AST 18 10 - 35 U/L   ALT 12 6 - 29 U/L      Assessment & Plan:   Problem List Items Addressed This Visit    Allergic rhinitis    Stable, recent flare, no sign of sinusitis Rx trial Atrovent PRN use      Relevant Medications   ipratropium (ATROVENT) 0.06 % nasal spray   Anticoagulated on Coumadin   Chronic atrial fibrillation (HCC)    Stable AFib, without recurrence RVR. On anticoagulation  Plan: 1. Continue Metoprolol succ XL 100mg  daily rate control - Continue Coumadin per [facility] Cardiology      Elevated hemoglobin A1c    Concern elevated A1c and at risk for possible new dx Pre-DM in future if still worsening A1c 5.5 up from 4.4, attributed to dietary non adherence  Plan:  1. Not on any therapy currently - remain off 2. Encourage improved lifestyle - low carb, low sugar diet, limit chocolate intake, reduce portion size, continue improving regular exercise 3. Follow-up 6 months POC A1c       Essential hypertension    Well-controlled HTN - Home BP readings stable  Complicated by chronic Atrial Fibrillation, MVR, Pulm HTN    Plan:  1. Continue current BP regimen Lisinopril 40mg  daily, Metoprolol XL 100mg  daily 2. Encourage improved lifestyle - low sodium diet, regular exercise 3. Continue monitor BP outside office, bring readings to next visit, if persistently >140/90 or new symptoms notify office sooner 4. Follow-up q 6 months, routinely with Cardiology      History of epigastric pain    Asymptomatic currently, history of severe episodic recurrent q 4-6 months RUQ vs epigastric Seems related to prior cholecystectomy 2003, has had prior imaging since CT showed some cholangitis vs inflammatory changes Followed by Gen Surgery and GI in past Reassuarnce and monitoring for now - can follow-up sooner if worsening. May take dicyclomine PRN      Osteopenia    Stable osteopenia without complication or new fracture - Dx by DEXA 09/2015, continues on bisphosphonate prophylaxis dose weekly since 05/2016 - On calcium supplement and vitamin  D therapy (low vit D as well) - Concern high risk with prior hip and ankle fractures in past  Plan: 1. Continue Alendronate bisphosphonate 35mg  weekly for now (start 05/2016 - goal for up to 3 years) 2. Re-check DEXA in 09/2017 April - she will contact [place] - we can fax order if needed 3. Continue Vitamin D3 2,000 iu daily maintenance 4. Follow-up q 6 months      Vitamin D deficiency    Controlled on maintenance Vit D 2k daily       Other Visit Diagnoses    Annual physical exam    -  Primary      Meds ordered this encounter  Medications  . Cholecalciferol (VITAMIN D3) 2000 units capsule    Sig: Take 1 capsule (2,000 Units total) by mouth daily.  Marland Kitchen ipratropium (ATROVENT) 0.06 % nasal spray    Sig: Place 2 sprays into both nostrils 4 (four) times daily. For up to 5-7 days then stop.    Dispense:  15 mL    Refill:  0     Follow up plan: Return in about 6 months (around 12/17/2017) for Elevated Sugar (Check A1c).  Nobie Putnam, [facility] 06/18/2017, 12:46 PM

## 2017-06-18 NOTE — Assessment & Plan Note (Signed)
Stable AFib, without recurrence RVR. On anticoagulation  Plan: 1. Continue Metoprolol succ XL 100mg  daily rate control - Continue Coumadin per [facility] Cardiology

## 2017-06-18 NOTE — Patient Instructions (Addendum)
Thank you for coming to the office today.  1. Elevated A1c up to 5.5, not quite at level of Pre-Diabetes, but mildly elevated. Try to be cautious with sweets / carbs / chocolate  2. Last DEXA Scan Bone Mineral Density in 09/2015, please call [place] around March 2019 to get updated scan, since you were dx with Osteopenia, and on Alendronate  3. Start Atrovent nasal spray decongestant 2 sprays in each nostril up to 4 times daily for 7 days  Please schedule a Follow-up Appointment to: Return in about 6 months (around 12/17/2017) for Elevated Sugar (Check A1c).  If you have any other questions or concerns, please feel free to call the office or send a message through [place]. You may also schedule an earlier appointment if necessary.  Additionally, you may be receiving a survey about your experience at our office within a few days to 1 week by e-mail or mail. We value your feedback.  Nobie Putnam, DO [place]

## 2017-06-18 NOTE — Assessment & Plan Note (Signed)
Stable, recent flare, no sign of sinusitis Rx trial Atrovent PRN use

## 2017-06-18 NOTE — Assessment & Plan Note (Signed)
Stable osteopenia without complication or new fracture - Dx by DEXA 09/2015, continues on bisphosphonate prophylaxis dose weekly since 05/2016 - On calcium supplement and vitamin D therapy (low vit D as well) - Concern high risk with prior hip and ankle fractures in past  Plan: 1. Continue Alendronate bisphosphonate 35mg  weekly for now (start 05/2016 - goal for up to 3 years) 2. Re-check DEXA in 09/2017 April - she will contact [place] - we can fax order if needed 3. Continue Vitamin D3 2,000 iu daily maintenance 4. Follow-up q 6 months

## 2017-06-18 NOTE — Assessment & Plan Note (Signed)
Concern elevated A1c and at risk for possible new dx Pre-DM in future if still worsening A1c 5.5 up from 4.4, attributed to dietary non adherence  Plan:  1. Not on any therapy currently - remain off 2. Encourage improved lifestyle - low carb, low sugar diet, limit chocolate intake, reduce portion size, continue improving regular exercise 3. Follow-up 6 months POC A1c

## 2017-06-18 NOTE — Assessment & Plan Note (Signed)
Asymptomatic currently, history of severe episodic recurrent q 4-6 months RUQ vs epigastric Seems related to prior cholecystectomy 2003, has had prior imaging since CT showed some cholangitis vs inflammatory changes Followed by Gen Surgery and GI in past Reassuarnce and monitoring for now - can follow-up sooner if worsening. May take dicyclomine PRN

## 2017-06-18 NOTE — Assessment & Plan Note (Signed)
Controlled on maintenance Vit D 2k daily

## 2017-06-18 NOTE — Assessment & Plan Note (Addendum)
Well-controlled HTN - Home BP readings stable  Complicated by chronic Atrial Fibrillation, MVR, Pulm HTN    Plan:  1. Continue current BP regimen Lisinopril 40mg  daily, Metoprolol XL 100mg  daily 2. Encourage improved lifestyle - low sodium diet, regular exercise 3. Continue monitor BP outside office, bring readings to next visit, if persistently >140/90 or new symptoms notify office sooner 4. Follow-up q 6 months, routinely with Cardiology

## 2017-06-22 ENCOUNTER — Ambulatory Visit (INDEPENDENT_AMBULATORY_CARE_PROVIDER_SITE_OTHER): Payer: Medicare Other

## 2017-06-22 DIAGNOSIS — I482 Chronic atrial fibrillation, unspecified: Secondary | ICD-10-CM

## 2017-06-22 DIAGNOSIS — I059 Rheumatic mitral valve disease, unspecified: Secondary | ICD-10-CM | POA: Diagnosis not present

## 2017-06-22 DIAGNOSIS — Z5181 Encounter for therapeutic drug level monitoring: Secondary | ICD-10-CM

## 2017-06-22 DIAGNOSIS — Z9889 Other specified postprocedural states: Secondary | ICD-10-CM

## 2017-06-22 DIAGNOSIS — I4891 Unspecified atrial fibrillation: Secondary | ICD-10-CM | POA: Diagnosis not present

## 2017-06-22 LAB — POCT INR: INR: 3.8

## 2017-06-22 NOTE — Patient Instructions (Signed)
Please take 1/2 pill today, then resume taking 1 tablet everyday except 1/2 tablet on Sundays and Thursdays.   Recheck INR in 4 weeks.

## 2017-07-20 ENCOUNTER — Ambulatory Visit (INDEPENDENT_AMBULATORY_CARE_PROVIDER_SITE_OTHER): Payer: Medicare Other

## 2017-07-20 DIAGNOSIS — I059 Rheumatic mitral valve disease, unspecified: Secondary | ICD-10-CM

## 2017-07-20 DIAGNOSIS — Z5181 Encounter for therapeutic drug level monitoring: Secondary | ICD-10-CM | POA: Diagnosis not present

## 2017-07-20 DIAGNOSIS — I482 Chronic atrial fibrillation, unspecified: Secondary | ICD-10-CM

## 2017-07-20 DIAGNOSIS — I4891 Unspecified atrial fibrillation: Secondary | ICD-10-CM

## 2017-07-20 DIAGNOSIS — Z9889 Other specified postprocedural states: Secondary | ICD-10-CM | POA: Diagnosis not present

## 2017-07-20 LAB — POCT INR: INR: 3.4

## 2017-07-20 NOTE — Patient Instructions (Signed)
Please continue taking 1 tablet everyday except 1/2 tablet on Sundays and Thursdays.   Recheck INR in 5 weeks.

## 2017-08-07 ENCOUNTER — Other Ambulatory Visit: Payer: Self-pay | Admitting: *Deleted

## 2017-08-07 DIAGNOSIS — I10 Essential (primary) hypertension: Secondary | ICD-10-CM

## 2017-08-07 MED ORDER — ATORVASTATIN CALCIUM 40 MG PO TABS: 40.0000 mg | ORAL_TABLET | Freq: Every evening | ORAL | 0 refills | Status: DC

## 2017-08-07 MED ORDER — LISINOPRIL 40 MG PO TABS
40.0000 mg | ORAL_TABLET | Freq: Every day | ORAL | 0 refills | Status: DC
Start: 2017-08-07 — End: 2017-11-12

## 2017-08-07 MED ORDER — METOPROLOL SUCCINATE ER 100 MG PO TB24: 100.0000 mg | ORAL_TABLET | Freq: Every day | ORAL | 0 refills | Status: DC

## 2017-08-11 ENCOUNTER — Ambulatory Visit (INDEPENDENT_AMBULATORY_CARE_PROVIDER_SITE_OTHER): Payer: Medicare Other | Admitting: Family Medicine

## 2017-08-11 ENCOUNTER — Encounter: Payer: Self-pay | Admitting: Family Medicine

## 2017-08-11 VITALS — BP 140/89 | HR 103 | Temp 100.2°F | Resp 16 | Ht 63.0 in | Wt 193.0 lb

## 2017-08-11 DIAGNOSIS — R509 Fever, unspecified: Secondary | ICD-10-CM | POA: Diagnosis not present

## 2017-08-11 DIAGNOSIS — Z20828 Contact with and (suspected) exposure to other viral communicable diseases: Secondary | ICD-10-CM

## 2017-08-11 DIAGNOSIS — J111 Influenza due to unidentified influenza virus with other respiratory manifestations: Secondary | ICD-10-CM | POA: Diagnosis not present

## 2017-08-11 LAB — POCT INFLUENZA A/B
INFLUENZA A, POC: NEGATIVE
INFLUENZA B, POC: NEGATIVE

## 2017-08-11 MED ORDER — OSELTAMIVIR PHOSPHATE 75 MG PO CAPS: 75.0000 mg | ORAL_CAPSULE | Freq: Two times a day (BID) | ORAL | 0 refills | Status: DC

## 2017-08-11 MED ORDER — BENZONATATE 100 MG PO CAPS: 100.0000 mg | ORAL_CAPSULE | Freq: Three times a day (TID) | ORAL | 0 refills | Status: DC | PRN

## 2017-08-11 MED ORDER — BALOXAVIR MARBOXIL(80 MG DOSE) 2 X 40 MG PO TBPK: 80.0000 mg | ORAL_TABLET | Freq: Once | ORAL | 0 refills | Status: DC

## 2017-08-11 NOTE — Progress Notes (Signed)
Subjective:    Patient ID: [name], female    DOB: [birth year], 62 y.o.   MRN: [id number]  [name] is a 62 y.o. female presenting on 08/11/2017 for Cough (ribs-back and head hurts with coughing, chills onset 2 days)  Patient presents for a same day appointment.  HPI   Suspected Influenza (Exposure to Flu A) / FLU-like illness vs Acute Bronchitis Reports symptoms started about 2 days ago sudden onset coughing with productive sputum yellow-green thicker sputum, worsening rib and back pain L>R worse with coughing also some headache associated with cough. Seems to have frequent coughing spells. - She lives at home with sister who was dx with Influenza A (works in school system) yesterday diagnosed and treated, she has been contact w/ her and she has been coughing for while before - Did not take any new medicines for current symptoms - She has an Atrovent nasal spray decongestant from prior rx, using with some good results - Admits some associated dyspnea with coughing spells only, not at rest - Admits some temperature instability last night - Denies chest pain pressure or tightness, only has rib/back pain with cough - Denies nausea vomiting abdominal pain, diarrhea, other muscle or body aches  Health Maintenance: UTD Flu Vaccine 02/2017  Depression screen [facility] 2/9 05/26/2017 01/27/2017 12/04/2015  Decreased Interest 0 0 0  Down, Depressed, Hopeless 0 0 0  PHQ - 2 Score 0 0 0  Altered sleeping - - -  Tired, decreased energy - - -  Change in appetite - - -  Feeling bad or failure about yourself  - - -  Trouble concentrating - - -  Moving slowly or fidgety/restless - - -  Suicidal thoughts - - -  PHQ-9 Score - - -  Difficult doing work/chores - - -  Some recent data might be hidden    Social History   Tobacco Use  . Smoking status: Never Smoker  . Smokeless tobacco: Never Used  Substance Use Topics  . Alcohol use: No  . Drug use: No    Review of Systems Per HPI  unless specifically indicated above     Objective:    BP 140/89   Pulse (!) 103   Temp 100.2 F (37.9 C) (Oral)   Resp 16   Ht 5\' 3"  (1.6 m)   Wt 193 lb (87.5 kg)   SpO2 100%   BMI 34.19 kg/m   Wt Readings from Last 3 Encounters:  08/11/17 193 lb (87.5 kg)  06/18/17 195 lb (88.5 kg)  05/26/17 188 lb 3.2 oz (85.4 kg)    Physical Exam  Constitutional: She is oriented to person, place, and time. She appears well-developed and well-nourished. No distress.  Mildly ill and tired appearing, uncomfortable with cough, cooperative  HENT:  Head: Normocephalic and atraumatic.  Mouth/Throat: Oropharynx is clear and moist.  Frontal sinuses mild tender non tender maxillary. Nares patent without purulence or edema. Bilateral TMs clear without erythema, effusion or bulging. Oropharynx clear without erythema, exudates, edema or asymmetry.  Eyes: Conjunctivae are normal. Right eye exhibits no discharge. Left eye exhibits no discharge.  Neck: Normal range of motion. Neck supple.  Cardiovascular: Regular rhythm and intact distal pulses.  No murmur heard. Tachycardic Irregularly irregular rhythm. Mechanical heart valve clicking    Pulmonary/Chest: Effort normal and breath sounds normal. No respiratory distress. She has no wheezes. She has no rales.  Mild reduced air movement bases R>L some coarse sounds clear with cough. No wheezing  or other focal abnormality. Good air movement. Occasional coughing spells  Musculoskeletal: Normal range of motion. She exhibits no edema.  Lymphadenopathy:    She has no cervical adenopathy.  Neurological: She is alert and oriented to person, place, and time.  Skin: Skin is warm and dry. No rash noted. She is not diaphoretic. No erythema.  Psychiatric: Her behavior is normal.  Nursing note and vitals reviewed.  Results for orders placed or performed in visit on 08/11/17  POCT Influenza A/B  Result Value Ref Range   Influenza A, POC Negative Negative   Influenza  B, POC Negative Negative      Assessment & Plan:   Problem List Items Addressed This Visit    None    Visit Diagnoses    Influenza    -  Primary   Relevant Medications   benzonatate (TESSALON) 100 MG capsule   oseltamivir (TAMIFLU) 75 MG capsule   Fever and chills       Relevant Orders   POCT Influenza A/B (Completed)   Exposure to the flu          Clinically diagnosed influenza despite negative rapid flu test today, concern for flu still due to significant known exposure to positive influenza A at home with family member. - Duration x 2 days, without complication. Tolerating PO and well hydrated - No other focal findings of infection today - S/p influenza vaccine this season  Plan: 1. Initial rx Xofluza 40mg  tabs x 2 for one dose 80mg  only - coupon given - we were notified that not covered by her [employer], they did not run it as cash pay and given voucher, therefore requested Tamiflu - Sent new rx Start Tamiflu 75mg  capsules BID x 5 days 2. Supportive care as advised with NSAID / Tylenol PRN fever/myalgias, improve hydration, may take OTC Cold/Flu meds 3. Start Tessalon Perls take 1 capsule up to 3 times a day as needed for cough - Return criteria given if significant worsening, consider post-influenza complications, otherwise follow-up if needed   Meds ordered this encounter  Medications  . NOT COVERED BY INSURANCE - SWITCHED RX TO TAMIFLU DISCONTD: Baloxavir Marboxil 80 MG Dose (XOFLUZA) 40 (2) MG TBPK    Sig: Take 80 mg by mouth once for 1 dose. For Flu    Dispense:  1 each    Refill:  0  . benzonatate (TESSALON) 100 MG capsule    Sig: Take 1 capsule (100 mg total) by mouth 3 (three) times daily as needed for cough.    Dispense:  30 capsule    Refill:  0  . oseltamivir (TAMIFLU) 75 MG capsule    Sig: Take 1 capsule (75 mg total) by mouth 2 (two) times daily. For 5 days    Dispense:  10 capsule    Refill:  0     Follow up plan: Return in about 1  week (around 08/18/2017), or if symptoms worsen or fail to improve, for flu.  Nobie Putnam, [facility] 08/11/2017, 12:22 PM

## 2017-08-11 NOTE — Patient Instructions (Addendum)
Thank you for coming to the office today.   Your flu test was NEGATIVE, this is not 100% though, and you can still have the flu with a negative test, otherwise it could be a different viral syndrome.  Start Xofluza 40mg  pills x 2 per one dose at one time then done = will stay in system for several days to fight the flu  - Wash hands and cover cough very well to avoid spread of infection - For symptom control:      - Take Ibuprofen / Advil 400-600mg  every 6-8 hours as needed for fever / muscle aches, and may also take Tylenol 500-1000mg  per dose every 6-8 hours or 3 times a day, can alternate dosing      - Start Tessalon perls one every 8 hours or 3 times a day as needed for cough      - Start Atrovent nasal spray decongestant 2 sprays in each nostril up to 4 times daily for 7 days      - Start OTC Mucinex for cough and congestion for up to 7 days - Improve hydration with plenty of clear fluids  If significant worsening with poor fluid intake, worsening fever, difficulty breathing due to coughing, worsening body aches, weakness, or other more concerning symptoms difficulty breathing you can seek treatment at Emergency Department. Also if improved flu symptoms and then worsening days to week later with concerns for bronchitis, productive cough fever chills again we may need to check for possible pneumonia that can occur after the flu  If not improved (but same) still coughing by Thursday - call back by noon, and we can send in new rx possibly cover with antibiotic - otherwise if improving continue current treatment.   Please schedule a Follow-up Appointment to: Return in about 1 week (around 08/18/2017), or if symptoms worsen or fail to improve, for flu.    If you have any other questions or concerns, please feel free to call the office or send a message through [place]. You may also schedule an earlier appointment if necessary.  Additionally, you may be receiving a survey about your  experience at our office within a few days to 1 week by e-mail or mail. We value your feedback.  Nobie Putnam, DO [place]

## 2017-08-13 ENCOUNTER — Telehealth: Payer: Self-pay | Admitting: Family Medicine

## 2017-08-13 DIAGNOSIS — B001 Herpesviral vesicular dermatitis: Secondary | ICD-10-CM

## 2017-08-13 DIAGNOSIS — J209 Acute bronchitis, unspecified: Secondary | ICD-10-CM

## 2017-08-13 MED ORDER — ACYCLOVIR 400 MG PO TABS: 400.0000 mg | ORAL_TABLET | Freq: Three times a day (TID) | ORAL | 1 refills | Status: DC

## 2017-08-13 MED ORDER — AZITHROMYCIN 250 MG PO TABS: ORAL_TABLET | ORAL | 0 refills | Status: DC

## 2017-08-13 NOTE — Telephone Encounter (Signed)
Pt. Called states that she was still coughing wanted you to  Call something in  [employer], She also have requested medication for  Fever blister Acyclovir. Pt  Call back # is  [phone]

## 2017-08-13 NOTE — Telephone Encounter (Signed)
Called patient. Medicines sent to [place], [facility] 08/13/2017, 12:36 PM

## 2017-08-17 ENCOUNTER — Emergency Department
Admission: EM | Admit: 2017-08-17 | Discharge: 2017-08-17 | Disposition: A | Discharge status: Home / Self Care | Payer: Medicare Other | Attending: Emergency Medicine | Admitting: Emergency Medicine

## 2017-08-17 ENCOUNTER — Encounter: Payer: Self-pay | Admitting: Emergency Medicine

## 2017-08-17 ENCOUNTER — Emergency Department: Payer: Medicare Other

## 2017-08-17 ENCOUNTER — Other Ambulatory Visit: Payer: Self-pay

## 2017-08-17 DIAGNOSIS — S93401A Sprain of unspecified ligament of right ankle, initial encounter: Secondary | ICD-10-CM | POA: Diagnosis not present

## 2017-08-17 DIAGNOSIS — Y998 Other external cause status: Secondary | ICD-10-CM | POA: Insufficient documentation

## 2017-08-17 DIAGNOSIS — Y939 Activity, unspecified: Secondary | ICD-10-CM | POA: Insufficient documentation

## 2017-08-17 DIAGNOSIS — M25571 Pain in right ankle and joints of right foot: Secondary | ICD-10-CM | POA: Diagnosis not present

## 2017-08-17 DIAGNOSIS — E785 Hyperlipidemia, unspecified: Secondary | ICD-10-CM | POA: Insufficient documentation

## 2017-08-17 DIAGNOSIS — Z79899 Other long term (current) drug therapy: Secondary | ICD-10-CM | POA: Diagnosis not present

## 2017-08-17 DIAGNOSIS — Y33XXXA Other specified events, undetermined intent, initial encounter: Secondary | ICD-10-CM | POA: Insufficient documentation

## 2017-08-17 DIAGNOSIS — Z7901 Long term (current) use of anticoagulants: Secondary | ICD-10-CM | POA: Diagnosis not present

## 2017-08-17 DIAGNOSIS — Y92008 Other place in unspecified non-institutional (private) residence as the place of occurrence of the external cause: Secondary | ICD-10-CM | POA: Insufficient documentation

## 2017-08-17 DIAGNOSIS — Z7982 Long term (current) use of aspirin: Secondary | ICD-10-CM | POA: Insufficient documentation

## 2017-08-17 DIAGNOSIS — I10 Essential (primary) hypertension: Secondary | ICD-10-CM | POA: Diagnosis not present

## 2017-08-17 DIAGNOSIS — I482 Chronic atrial fibrillation: Secondary | ICD-10-CM | POA: Insufficient documentation

## 2017-08-17 DIAGNOSIS — E7800 Pure hypercholesterolemia, unspecified: Secondary | ICD-10-CM | POA: Insufficient documentation

## 2017-08-17 DIAGNOSIS — S99911A Unspecified injury of right ankle, initial encounter: Secondary | ICD-10-CM | POA: Diagnosis not present

## 2017-08-17 MED ORDER — MELOXICAM 15 MG PO TABS: 15.0000 mg | ORAL_TABLET | Freq: Every day | ORAL | 0 refills | Status: DC

## 2017-08-17 MED ORDER — ONDANSETRON 8 MG PO TBDP
8.0000 mg | ORAL_TABLET | Freq: Once | ORAL | Status: AC
  Administered 2017-08-17: 8 mg via ORAL
  Filled 2017-08-17: qty 1

## 2017-08-17 MED ORDER — HYDROCODONE-ACETAMINOPHEN 5-325 MG PO TABS: 1.0000 | ORAL_TABLET | ORAL | 0 refills | Status: DC | PRN

## 2017-08-17 MED ORDER — HYDROCODONE-ACETAMINOPHEN 5-325 MG PO TABS
1.0000 | ORAL_TABLET | Freq: Once | ORAL | Status: AC
  Administered 2017-08-17: 1 via ORAL
  Filled 2017-08-17: qty 1

## 2017-08-17 NOTE — ED Triage Notes (Signed)
Fall, right ankle injury.  Right ankle swelling and pain.

## 2017-08-17 NOTE — ED Provider Notes (Signed)
[facility] Emergency Department Provider Note  ____________________________________________  Time seen: Approximately 5:34 PM  I have reviewed the triage vital signs and the nursing notes.   HISTORY  Chief Complaint Ankle Pain    HPI [name] is a 62 y.o. female presents emergency department complaining of right ankle pain.  Patient reports that she was coming down a ramp at her house, got to the last step, and rolled her ankle.  Patient reports that she did fall and barely strike the left temporal region.  She did not pass out.  She does not have a headache, visual changes, neck pain.  Patient had previous significant ankle fracture with reconstruction was still in place screws and plates.  Patient is concerned that she is now having increased pain and swelling to her ankle.  Surgery was several years prior.  Patient denies any knee or hip pain.  No medications for this complaint prior to arrival.  No other complaints at this time.  Patient has a history of osteopenia, hypertension, rheumatic heart disease with mitral valve replacement, dysrhythmia, hypercholesterolemia, hyperlipidemia, hyperthyroidism.  No complaints with chronic medical problem.  Past Medical History:  Diagnosis Date  . Anticoagulated on warfarin   . Arthritis   . Chronic atrial fibrillation ([place])    SINCE 1997  . Dysrhythmia   . Goiter    multinodular  . Heart murmur   . History of hyperthyroidism    2003--  PTU [place]  . Hypercholesterolemia   . Hyperlipidemia   . Hyperthyroidism    H/O  . Pulmonary hypertension, mild ([place])   . Rheumatic heart disease    CARDIOLOGIST-- DR Marcello Moores [facility]  . S/P MVR (mitral valve replacement)    [place]--  SECONDARY TO SEVERE MV STENOSIS    Patient Active Problem List   Diagnosis Date Noted  . Elevated hemoglobin A1c 06/18/2017  . Allergic rhinitis 12/02/2016  . Vitamin D deficiency 12/02/2016  . S/P MVR (mitral valve replacement)  06/03/2016  . Osteopenia 06/03/2016  . Environmental and seasonal allergies 06/03/2016  . History of epigastric pain 03/13/2016  . Breast microcalcifications 10/15/2015  . Piriformis syndrome 10/02/2015  . History of femur fracture 04/26/2014  . Encounter for therapeutic drug monitoring 07/19/2013  . Choledocholithiasis 08/14/2011  . Anticoagulated on Coumadin 08/14/2011  . Mitral valve disease 08/15/2010  . MITRAL VALVE REPLACEMENT, HX OF 04/24/2009  . History of hyperthyroidism 04/19/2009  . Essential hypertension 10/17/2008  . Pulmonary hypertension ([place]) 10/17/2008  . RHEUMATIC HEART DISEASE, HX OF 10/17/2008  . GOITER, MULTINODULAR 10/12/2007  . HYPERCHOLESTEROLEMIA 10/12/2007  . Chronic atrial fibrillation ([place]) 10/12/2007  . GERD 10/12/2007    Past Surgical History:  Procedure Laterality Date  . ANTERIOR CERVICAL DECOMP/DISCECTOMY FUSION  12-21-2001   C5 --  C6  . BREAST BIOPSY Left 11/03/2016   Procedure: BREAST BIOPSY WITH NEEDLE LOCALIZATION;  Surgeon: Robert Bellow, MD;  Location: [facility] ORS;  Service: General;  Laterality: Left;  . BREAST EXCISIONAL BIOPSY Left 11/03/2016   excision of calcs. Path pending  . CARDIAC CATHETERIZATION  12-28-2006  [facility]   NORMAL CORONARY ARTERIES  . CARPAL TUNNEL RELEASE Right 01-20-2007  . COLONOSCOPY  2015  . ERCP  08/15/2011   Procedure: ENDOSCOPIC RETROGRADE CHOLANGIOPANCREATOGRAPHY (ERCP);  Surgeon: Jeryl Columbia, MD;  Location: [facility] ENDOSCOPY;  Service: Endoscopy;  Laterality: N/A;  . ERCP     MULTIPLE--  INCLUDING STENTS, SPHINTEROTOMY'S  STONE REMOVAL   . FEMUR IM NAIL  Right 04/26/2014   Procedure: INTRAMEDULLARY (IM) NAIL FEMORAL;  Surgeon: Alta Corning, MD;  Location: [facility];  Service: Orthopedics;  Laterality: Right;  . I&D EXTREMITY Right 02/07/2013   Procedure: IRRIGATION AND DEBRIDEMENT OF RIGHT LOWER LEG WITH PLACEMENT OF ACELL AND WOULND VAC;  Surgeon: Theodoro Kos, DO;  Location: [facility];   Service: Plastics;  Laterality: Right;  . LAPAROSCOPIC CHOLECYSTECTOMY  04-08-2002  . MITRAL VALVE REPLACEMENT  1997   [facility]  . ORIF ANKLE FRACTURE Right 11/05/2012   Procedure: OPEN REDUCTION INTERNAL FIXATION (ORIF) ANKLE FRACTURE only operating on right;  Surgeon: Alta Corning, MD;  Location: [place];  Service: Orthopedics;  Laterality: Right;  . ORIF RIGHT ULNAR FX W/ ILIAC CREST BONE GRAFT  10-06-2008  . ORIF WRIST FRACTURE Right 04/26/2014   Procedure: OPEN REDUCTION INTERNAL FIXATION (ORIF) WRIST FRACTURE;  Surgeon: Alta Corning, MD;  Location: [facility];  Service: Orthopedics;  Laterality: Right;  . TRANSTHORACIC ECHOCARDIOGRAM  06-29-2009  dr wall   normal lvsf/ ef 55-60%/  normal bileaflet mechanical mvr/ moderated dilated la/ moderate tr  . WRIST ARTHROSCOPY Right 12-10-2007   debridement and ulnar shortening osteotomy w/ plate    Prior to Admission medications   Medication Sig Start Date End Date Taking? Authorizing Provider  acyclovir (ZOVIRAX) 400 MG tablet Take 1 tablet (400 mg total) by mouth 3 (three) times daily. For 5 to 10 days or until healed for fever blister 08/13/17   Karamalegos, Devonne Doughty, DO  alendronate (FOSAMAX) 35 MG tablet Take 1 tablet (35 mg total) by mouth every 7 (seven) days. Take with a full glass of water on an empty stomach. 06/03/16   Karamalegos, Devonne Doughty, DO  amitriptyline (ELAVIL) 50 MG tablet Take 50 mg by mouth at bedtime as needed for sleep.  01/03/16   [provider]  aspirin EC 81 MG tablet Take 1 tablet (81 mg total) by mouth daily. 07/17/16   Lelon Perla, MD  atorvastatin (LIPITOR) 40 MG tablet Take 1 tablet (40 mg total) by mouth every evening. NEED OV. 08/07/17   Lelon Perla, MD  azithromycin (ZITHROMAX Z-PAK) 250 MG tablet Take 2 tabs (500mg  total) on Day 1. Take 1 tab (250mg ) daily for next 4 days. 08/13/17   Karamalegos, Devonne Doughty, DO  benzonatate (TESSALON) 100 MG capsule Take 1 capsule (100 mg total) by mouth 3  (three) times daily as needed for cough. 08/11/17   Karamalegos, Devonne Doughty, DO  calcium carbonate (OSCAL) 1500 (600 Ca) MG TABS tablet Take 1 tablet by mouth daily.     [provider]  cetirizine (ZYRTEC) 10 MG tablet Take 1 tablet (10 mg total) by mouth daily. 12/02/16   Karamalegos, Devonne Doughty, DO  Cholecalciferol (VITAMIN D3) 2000 units capsule Take 1 capsule (2,000 Units total) by mouth daily. 06/18/17   Karamalegos, Devonne Doughty, DO  cycloSPORINE (RESTASIS) 0.05 % ophthalmic emulsion Place 1 drop into both eyes 2 (two) times daily as needed (dry eyes).    [provider]  dicyclomine (BENTYL) 10 MG capsule Take 1 capsule (10 mg total) by mouth 4 (four) times daily -  before meals and at bedtime. Patient taking differently: Take 10 mg by mouth at bedtime.  04/22/16   Lucilla Lame, MD  fluticasone (FLONASE) 50 MCG/ACT nasal spray Place 2 sprays into both nostrils daily. 05/27/17   Karamalegos, Devonne Doughty, DO  HYDROcodone-acetaminophen (NORCO/VICODIN) 5-325 MG tablet Take 1 tablet by mouth every 4 (four)  hours as needed for moderate pain. 08/17/17   Cuthriell, Charline Bills, PA-C  ibuprofen (ADVIL,MOTRIN) 200 MG tablet Take 200 mg by mouth 2 (two) times daily as needed for headache or moderate pain.    [provider]  ipratropium (ATROVENT) 0.06 % nasal spray Place 2 sprays into both nostrils 4 (four) times daily. For up to 5-7 days then stop. 06/18/17   Karamalegos, Devonne Doughty, DO  lisinopril (PRINIVIL,ZESTRIL) 40 MG tablet Take 1 tablet (40 mg total) by mouth daily. NEED OV. 08/07/17   Lelon Perla, MD  meloxicam (MOBIC) 15 MG tablet Take 1 tablet (15 mg total) by mouth daily. 08/17/17   Cuthriell, Charline Bills, PA-C  metoprolol succinate (TOPROL-XL) 100 MG 24 hr tablet Take 1 tablet (100 mg total) by mouth daily. NEED OV. 08/07/17   Lelon Perla, MD  Multiple Vitamins-Minerals (CENTRUM SILVER ADULT 50+ PO) Take 1 tablet by mouth daily.    [provider]   oseltamivir (TAMIFLU) 75 MG capsule Take 1 capsule (75 mg total) by mouth 2 (two) times daily. For 5 days 08/11/17   Olin Hauser, DO  warfarin (COUMADIN) 5 MG tablet Take 1/2 to 1 tablet by mouth daily as directed by coumadin clinic 04/21/17   Lelon Perla, MD    Allergies Patient has no known allergies.  Family History  Problem Relation Age of Onset  . Hypothyroidism Sister   . Gallbladder disease Sister   . Coronary artery disease Unknown   . Diabetes Mother   . Hypertension Mother   . Heart disease Father   . Stroke Father   . Breast cancer Neg Hx     Social History Social History   Tobacco Use  . Smoking status: Never Smoker  . Smokeless tobacco: Never Used  Substance Use Topics  . Alcohol use: No  . Drug use: No     Review of Systems  Constitutional: No fever/chills Eyes: No visual changes.  Cardiovascular: no chest pain. Respiratory: no cough. No SOB. Gastrointestinal: No abdominal pain.  No nausea, no vomiting.   Musculoskeletal: Positive for right ankle and foot pain.  Skin: Negative for rash, abrasions, lacerations, ecchymosis. Neurological: Negative for headaches, focal weakness or numbness. 10-point ROS otherwise negative.  ____________________________________________   PHYSICAL EXAM:  VITAL SIGNS: ED Triage Vitals  Enc Vitals Group     BP 08/17/17 1710 (!) 188/94     Pulse Rate 08/17/17 1710 94     Resp 08/17/17 1710 18     Temp 08/17/17 1710 98 F (36.7 C)     Temp Source 08/17/17 1710 Oral     SpO2 08/17/17 1710 96 %     Weight 08/17/17 1708 193 lb (87.5 kg)     Height 08/17/17 1708 5\' 3"  (1.6 m)     Head Circumference --      Peak Flow --      Pain Score 08/17/17 1708 6     Pain Loc --      Pain Edu? --      Excl. in [place]? --      Constitutional: Alert and oriented. Well appearing and in no acute distress. Eyes: Conjunctivae are normal. PERRL. EOMI. Head: Atraumatic.  No visible signs of trauma.  Patient is  nontender to palpation of the osseous structures of the head and face.  No raccoon eyes, battle signs, serosanguineous fluid drainage from the ears or nares. ENT:      Ears:  Nose: No congestion/rhinnorhea.      Mouth/Throat: Mucous membranes are moist.  Neck: No stridor.  No cervical spine tenderness to palpation  Cardiovascular: Normal rate, regular rhythm. Normal S1 and S2.  On auscultation, mechanical heart valve sounds are appreciated.  Good peripheral circulation. Respiratory: Normal respiratory effort without tachypnea or retractions. Lungs CTAB. Good air entry to the bases with no decreased or absent breath sounds. Musculoskeletal: Full range of motion to all extremities. No gross deformities appreciated.  Mild edema noted to ankle and midfoot.  No significant deformity.  Patient has limited range of motion at baseline due to previous fractures with in place hardware.  Surgical scars are present.  On palpation, patient is very tender to palpation along the talus and navicular bones.  Edema is present in this area.  No palpable abnormality.  On palpation of the ankle, patient is diffusely tender to bilateral aspects and anterior aspect of the ankle.  Patient with appreciable palpable screw head to the lateral aspect.  Patient reports that this is chronic and there is no changes in this area from baseline.  Dorsalis pedis pulse intact.  Sensation intact all 5 digits. Neurologic:  Normal speech and language. No gross focal neurologic deficits are appreciated. Nerves II through XII grossly intact. Skin:  Skin is warm, dry and intact. No rash noted. Psychiatric: Mood and affect are normal. Speech and behavior are normal. Patient exhibits appropriate insight and judgement.   ____________________________________________   LABS (all labs ordered are listed, but only abnormal results are displayed)  Labs Reviewed - No data to  display ____________________________________________  EKG   ____________________________________________  RADIOLOGY Diamantina Providence Cuthriell, personally viewed and evaluated these images (plain radiographs) as part of my medical decision making, as well as reviewing the written report by the radiologist.  Dg Ankle Complete Right  Result Date: 08/17/2017 CLINICAL DATA:  Fall with ankle pain EXAM: RIGHT ANKLE - COMPLETE 3+ VIEW COMPARISON:  11/05/2012 FINDINGS: Surgical plate and screw fixation of the distal fibula across old fracture deformity. Screw fixation of the medial malleolus across old fracture deformity. No definite acute displaced fracture is seen. Mortise symmetric. Mild degenerative changes medially and laterally. Vascular calcification. Small plantar calcaneal spur IMPRESSION: 1. Postsurgical changes of the distal fibula and tibia. No definite acute osseous abnormality Electronically Signed   By: Donavan Foil M.D.   On: 08/17/2017 18:04    ____________________________________________    PROCEDURES  Procedure(s) performed:    Procedures    Medications  HYDROcodone-acetaminophen (NORCO/VICODIN) 5-325 MG per tablet 1 tablet (1 tablet Oral Given 08/17/17 1752)  ondansetron (ZOFRAN-ODT) disintegrating tablet 8 mg (8 mg Oral Given 08/17/17 1752)     ____________________________________________   INITIAL IMPRESSION / ASSESSMENT AND PLAN / ED COURSE  Pertinent labs & imaging results that were available during my care of the patient were reviewed by me and considered in my medical decision making (see chart for details).  Review of the [place] CSRS was performed in accordance of the [facility] prior to dispensing any controlled drugs.     Patient's diagnosis is consistent with right ankle sprain.  Patient presents the emergency department with pain and swelling to the right foot.  Patient has a history of previous surgeries from significant ankle injury.  On exam, exam was mostly  reassuring.  Differential included fracture versus dislocation versus sprain versus loosening of in place hardware.  X-ray reveals no acute osseous abnormal lucent hardware.  No further imaging deemed necessary at this time.  Patient has multiple braces at home and will wear 1 of these.  Patient is to use cane or walker at home for ambulation.. Patient will be discharged home with prescriptions for meloxicam and #10 of Vicodin.  Patient reports that she is able to take NSAIDs even with mechanical heart valve.. Patient is to follow up with orthopedics as needed or otherwise directed. Patient is given ED precautions to return to the ED for any worsening or new symptoms.     ____________________________________________  FINAL CLINICAL IMPRESSION(S) / ED DIAGNOSES  Final diagnoses:  Sprain of right ankle, unspecified ligament, initial encounter      NEW MEDICATIONS STARTED DURING THIS VISIT:  ED Discharge Orders        Ordered    meloxicam (MOBIC) 15 MG tablet  Daily     08/17/17 1857    HYDROcodone-acetaminophen (NORCO/VICODIN) 5-325 MG tablet  Every 4 hours PRN     08/17/17 1857          This chart was dictated using voice recognition software/Dragon. Despite best efforts to proofread, errors can occur which can change the meaning. Any change was purely unintentional.    Darletta Moll, PA-C 08/17/17 1859    Orbie Pyo, MD 08/17/17 2325

## 2017-08-17 NOTE — ED Notes (Addendum)
See triage note  States she slipped and fell  Having pain to right ankle no deformity noted  Positive pulses

## 2017-08-18 ENCOUNTER — Other Ambulatory Visit: Payer: Self-pay

## 2017-08-20 DIAGNOSIS — M25571 Pain in right ankle and joints of right foot: Secondary | ICD-10-CM | POA: Diagnosis not present

## 2017-08-24 ENCOUNTER — Ambulatory Visit (INDEPENDENT_AMBULATORY_CARE_PROVIDER_SITE_OTHER): Payer: Medicare Other

## 2017-08-24 DIAGNOSIS — I059 Rheumatic mitral valve disease, unspecified: Secondary | ICD-10-CM

## 2017-08-24 DIAGNOSIS — Z5181 Encounter for therapeutic drug level monitoring: Secondary | ICD-10-CM

## 2017-08-24 DIAGNOSIS — I4891 Unspecified atrial fibrillation: Secondary | ICD-10-CM | POA: Diagnosis not present

## 2017-08-24 DIAGNOSIS — I482 Chronic atrial fibrillation, unspecified: Secondary | ICD-10-CM

## 2017-08-24 DIAGNOSIS — Z9889 Other specified postprocedural states: Secondary | ICD-10-CM | POA: Diagnosis not present

## 2017-08-24 LAB — POCT INR: INR: 4

## 2017-08-24 NOTE — Patient Instructions (Signed)
Please take 1/2 tablet tonight, then continue taking 1 tablet everyday except 1/2 tablet on Sundays and Thursdays.   Try to have some greens today or tomorrow.  Recheck INR in 5 weeks.

## 2017-09-03 DIAGNOSIS — M79671 Pain in right foot: Secondary | ICD-10-CM | POA: Diagnosis not present

## 2017-09-03 DIAGNOSIS — M25571 Pain in right ankle and joints of right foot: Secondary | ICD-10-CM | POA: Diagnosis not present

## 2017-09-10 ENCOUNTER — Other Ambulatory Visit: Payer: Self-pay

## 2017-09-10 DIAGNOSIS — Z1231 Encounter for screening mammogram for malignant neoplasm of breast: Secondary | ICD-10-CM

## 2017-09-28 ENCOUNTER — Ambulatory Visit (INDEPENDENT_AMBULATORY_CARE_PROVIDER_SITE_OTHER): Payer: Medicare Other

## 2017-09-28 DIAGNOSIS — Z9889 Other specified postprocedural states: Secondary | ICD-10-CM | POA: Diagnosis not present

## 2017-09-28 DIAGNOSIS — I059 Rheumatic mitral valve disease, unspecified: Secondary | ICD-10-CM

## 2017-09-28 DIAGNOSIS — I4891 Unspecified atrial fibrillation: Secondary | ICD-10-CM | POA: Diagnosis not present

## 2017-09-28 DIAGNOSIS — I482 Chronic atrial fibrillation, unspecified: Secondary | ICD-10-CM

## 2017-09-28 DIAGNOSIS — Z5181 Encounter for therapeutic drug level monitoring: Secondary | ICD-10-CM | POA: Diagnosis not present

## 2017-09-28 LAB — POCT INR: INR: 4.3

## 2017-09-28 NOTE — Patient Instructions (Signed)
Please skip coumadin tonight, then continue taking 1 tablet everyday except 1/2 tablet on Sundays and Thursdays.   Be consistent w/ your greens intake.  Recheck INR in 3 weeks.

## 2017-10-19 ENCOUNTER — Ambulatory Visit (INDEPENDENT_AMBULATORY_CARE_PROVIDER_SITE_OTHER): Payer: Medicare Other

## 2017-10-19 DIAGNOSIS — Z5181 Encounter for therapeutic drug level monitoring: Secondary | ICD-10-CM | POA: Diagnosis not present

## 2017-10-19 DIAGNOSIS — I482 Chronic atrial fibrillation, unspecified: Secondary | ICD-10-CM

## 2017-10-19 DIAGNOSIS — I4891 Unspecified atrial fibrillation: Secondary | ICD-10-CM | POA: Diagnosis not present

## 2017-10-19 DIAGNOSIS — I059 Rheumatic mitral valve disease, unspecified: Secondary | ICD-10-CM | POA: Diagnosis not present

## 2017-10-19 DIAGNOSIS — Z9889 Other specified postprocedural states: Secondary | ICD-10-CM | POA: Diagnosis not present

## 2017-10-19 LAB — POCT INR: INR: 3.2

## 2017-10-19 NOTE — Patient Instructions (Signed)
Please continue taking 1 tablet everyday except 1/2 tablet on Sundays and Thursdays.   Be consistent w/ your greens intake.  Recheck INR in 3 weeks.

## 2017-10-21 ENCOUNTER — Encounter: Payer: Self-pay | Admitting: Cardiology

## 2017-10-29 ENCOUNTER — Ambulatory Visit: Payer: Medicare Other | Admitting: General Surgery

## 2017-11-02 NOTE — Progress Notes (Signed)
HPI: FU atrial fibrillation and previous mitral valve replacement for rheumatic heart disease. Patient had mitral valve replacement in 1997. Cardiac catheterization in 2008 showed normal coronary arteries. ABIs with Doppler in July of 2014 normal. Last echocardiogram December 2014 showed normal LV function, trace aortic insufficiency, normally functioning prosthetic mitral valve, severe left atrial enlargement. Holter monitor January 2015 showed heart rate was elevated and beta blocker was increased. Since last seen, the patient has dyspnea with more extreme activities but not with routine activities. It is relieved with rest. It is not associated with chest pain. There is no orthopnea, PND or pedal edema. There is no syncope or palpitations. There is no exertional chest pain.    Current Outpatient Medications  Medication Sig Dispense Refill  . acyclovir (ZOVIRAX) 400 MG tablet Take 1 tablet (400 mg total) by mouth 3 (three) times daily. For 5 to 10 days or until healed for fever blister 30 tablet 1  . alendronate (FOSAMAX) 35 MG tablet Take 1 tablet (35 mg total) by mouth every 7 (seven) days. Take with a full glass of water on an empty stomach. 12 tablet 3  . amitriptyline (ELAVIL) 50 MG tablet Take 50 mg by mouth at bedtime as needed for sleep.     Marland Kitchen aspirin EC 81 MG tablet Take 1 tablet (81 mg total) by mouth daily. 90 tablet 3  . atorvastatin (LIPITOR) 40 MG tablet Take 1 tablet (40 mg total) by mouth every evening. NEED OV. 90 tablet 0  . calcium carbonate (OSCAL) 1500 (600 Ca) MG TABS tablet Take 1 tablet by mouth daily.     . cetirizine (ZYRTEC) 10 MG tablet Take 1 tablet (10 mg total) by mouth daily. 30 tablet 11  . Cholecalciferol (VITAMIN D3) 2000 units capsule Take 1 capsule (2,000 Units total) by mouth daily.    . cycloSPORINE (RESTASIS) 0.05 % ophthalmic emulsion Place 1 drop into both eyes 2 (two) times daily as needed (dry eyes).    Marland Kitchen dicyclomine (BENTYL) 10 MG capsule Take 1  capsule (10 mg total) by mouth 4 (four) times daily -  before meals and at bedtime. (Patient taking differently: Take 10 mg by mouth at bedtime. ) 90 capsule 3  . fluticasone (FLONASE) 50 MCG/ACT nasal spray Place 2 sprays into both nostrils daily. 16 g 11  . ibuprofen (ADVIL,MOTRIN) 200 MG tablet Take 200 mg by mouth 2 (two) times daily as needed for headache or moderate pain.    Marland Kitchen lisinopril (PRINIVIL,ZESTRIL) 40 MG tablet Take 1 tablet (40 mg total) by mouth daily. NEED OV. 90 tablet 0  . Multiple Vitamins-Minerals (CENTRUM SILVER ADULT 50+ PO) Take 1 tablet by mouth daily.    Marland Kitchen warfarin (COUMADIN) 5 MG tablet Take 1/2 to 1 tablet by mouth daily as directed by coumadin clinic 90 tablet 1   No current facility-administered medications for this visit.      Past Medical History:  Diagnosis Date  . Anticoagulated on warfarin   . Arthritis   . Chronic atrial fibrillation ([place])    SINCE 1997  . Dysrhythmia   . Goiter    multinodular  . Heart murmur   . History of hyperthyroidism    2003--  PTU [place]  . Hypercholesterolemia   . Hyperlipidemia   . Hyperthyroidism    H/O  . Pulmonary hypertension, mild ([place])   . Rheumatic heart disease    CARDIOLOGIST-- DR Marcello Moores WALL  . S/P MVR (mitral valve replacement)  1997  [facility]--  SECONDARY TO SEVERE MV STENOSIS    Past Surgical History:  Procedure Laterality Date  . ANTERIOR CERVICAL DECOMP/DISCECTOMY FUSION  12-21-2001   C5 --  C6  . BREAST BIOPSY Left 11/03/2016   Procedure: BREAST BIOPSY WITH NEEDLE LOCALIZATION;  Surgeon: Robert Bellow, MD;  Location: [facility] ORS;  Service: General;  Laterality: Left;  . BREAST EXCISIONAL BIOPSY Left 11/03/2016   excision of calcs. benign  . CARDIAC CATHETERIZATION  12-28-2006  [facility]   NORMAL CORONARY ARTERIES  . CARPAL TUNNEL RELEASE Right 01-20-2007  . COLONOSCOPY  2015  . ERCP  08/15/2011   Procedure: ENDOSCOPIC RETROGRADE CHOLANGIOPANCREATOGRAPHY (ERCP);  Surgeon: Jeryl Columbia, MD;   Location: [facility] ENDOSCOPY;  Service: Endoscopy;  Laterality: N/A;  . ERCP     MULTIPLE--  INCLUDING STENTS, SPHINTEROTOMY'S  STONE REMOVAL   . FEMUR IM NAIL Right 04/26/2014   Procedure: INTRAMEDULLARY (IM) NAIL FEMORAL;  Surgeon: Alta Corning, MD;  Location: [place];  Service: Orthopedics;  Laterality: Right;  . I&D EXTREMITY Right 02/07/2013   Procedure: IRRIGATION AND DEBRIDEMENT OF RIGHT LOWER LEG WITH PLACEMENT OF ACELL AND WOULND VAC;  Surgeon: Theodoro Kos, DO;  Location: [place];  Service: Plastics;  Laterality: Right;  . LAPAROSCOPIC CHOLECYSTECTOMY  04-08-2002  . MITRAL VALVE REPLACEMENT  1997   [facility]  . ORIF ANKLE FRACTURE Right 11/05/2012   Procedure: OPEN REDUCTION INTERNAL FIXATION (ORIF) ANKLE FRACTURE only operating on right;  Surgeon: Alta Corning, MD;  Location: [place];  Service: Orthopedics;  Laterality: Right;  . ORIF RIGHT ULNAR FX W/ ILIAC CREST BONE GRAFT  10-06-2008  . ORIF WRIST FRACTURE Right 04/26/2014   Procedure: OPEN REDUCTION INTERNAL FIXATION (ORIF) WRIST FRACTURE;  Surgeon: Alta Corning, MD;  Location: [place];  Service: Orthopedics;  Laterality: Right;  . TRANSTHORACIC ECHOCARDIOGRAM  06-29-2009  dr wall   normal lvsf/ ef 55-60%/  normal bileaflet mechanical mvr/ moderated dilated la/ moderate tr  . WRIST ARTHROSCOPY Right 12-10-2007   debridement and ulnar shortening osteotomy w/ plate    Social History   Socioeconomic History  . Marital status: Divorced    Spouse name: Not on file  . Number of children: Not on file  . Years of education: 28  . Highest education level: 12th grade  Occupational History  . Occupation: Control and instrumentation engineer  Social Needs  . Financial resource strain: Not very hard  . Food insecurity:    Worry: Never true    Inability: Never true  . Transportation needs:    Medical: No    Non-medical: No  Tobacco Use  . Smoking status: Never Smoker  . Smokeless tobacco: Never Used  Substance and Sexual Activity  .  Alcohol use: No  . Drug use: No  . Sexual activity: Not on file  Lifestyle  . Physical activity:    Days per week: 1 day    Minutes per session: 60 min  . Stress: Not at all  Relationships  . Social connections:    Talks on phone: More than three times a week    Gets together: More than three times a week    Attends religious service: More than 4 times per year    Active member of club or organization: Yes    Attends meetings of clubs or organizations: More than 4 times per year    Relationship status: Divorced  . Intimate partner violence:    Fear of current or  ex partner: No    Emotionally abused: No    Physically abused: No    Forced sexual activity: No  Other Topics Concern  . Not on file  Social History Narrative  . Not on file    Family History  Problem Relation Age of Onset  . Hypothyroidism Sister   . Gallbladder disease Sister   . Coronary artery disease Unknown   . Diabetes Mother   . Hypertension Mother   . Heart disease Father   . Stroke Father   . Breast cancer Neg Hx     ROS: no fevers or chills, productive cough, hemoptysis, dysphasia, odynophagia, melena, hematochezia, dysuria, hematuria, rash, seizure activity, orthopnea, PND, pedal edema, claudication. Remaining systems are negative.  Physical Exam: Well-developed well-nourished in no acute distress.  Skin is warm and dry.  HEENT is normal.  Neck is supple.  Chest is clear to auscultation with normal expansion.  Cardiovascular exam is irregular; crisp mechanical valve sound Abdominal exam nontender or distended. No masses palpated. Extremities show no edema. neuro grossly intact  ECG-atrial fibrillation at a rate of 92.  No ST changes.  Personally reviewed  A/P  1 prior mitral valve replacement-continue Coumadin and aspirin.  Continue SBE prophylaxis.  Repeat echocardiogram.  2 permanent atrial fibrillation-heart rate is controlled.  Continue present dose of beta-blockade.  Continue  Coumadin.    3 hypertension-blood pressure is controlled.  Continue present medications.   4 hyperlipidemia-continue statin.    Kirk Ruths, MD

## 2017-11-06 ENCOUNTER — Ambulatory Visit
Admission: RE | Admit: 2017-11-06 | Discharge: 2017-11-06 | Disposition: A | Discharge status: Home / Self Care | Payer: Medicare Other | Source: Ambulatory Visit | Attending: General Surgery | Admitting: General Surgery

## 2017-11-06 DIAGNOSIS — Z1231 Encounter for screening mammogram for malignant neoplasm of breast: Secondary | ICD-10-CM

## 2017-11-09 ENCOUNTER — Ambulatory Visit (INDEPENDENT_AMBULATORY_CARE_PROVIDER_SITE_OTHER): Payer: Medicare Other

## 2017-11-09 ENCOUNTER — Telehealth: Payer: Self-pay | Admitting: General Surgery

## 2017-11-09 DIAGNOSIS — J3089 Other allergic rhinitis: Secondary | ICD-10-CM

## 2017-11-09 DIAGNOSIS — I4891 Unspecified atrial fibrillation: Secondary | ICD-10-CM | POA: Diagnosis not present

## 2017-11-09 DIAGNOSIS — Z9889 Other specified postprocedural states: Secondary | ICD-10-CM | POA: Diagnosis not present

## 2017-11-09 DIAGNOSIS — I482 Chronic atrial fibrillation, unspecified: Secondary | ICD-10-CM

## 2017-11-09 DIAGNOSIS — Z5181 Encounter for therapeutic drug level monitoring: Secondary | ICD-10-CM

## 2017-11-09 DIAGNOSIS — I059 Rheumatic mitral valve disease, unspecified: Secondary | ICD-10-CM

## 2017-11-09 LAB — POCT INR: INR: 2.8

## 2017-11-09 NOTE — Patient Instructions (Signed)
Please take extra 1/2 tablets tonight, then continue taking 1 tablet everyday except 1/2 tablet on Sundays and Thursdays.   Be consistent w/ your greens intake.  Recheck INR in 3 weeks.

## 2017-11-09 NOTE — Telephone Encounter (Signed)
PATIENT HAS AN APPOINTMENT SCHEDULED FOR 11-09-17 TO F/U ON A CAT 1 MAMMO.I HAVE L/M FOR HER TO CALL & RESCHEDULE DUE TO AN EMERGENCY HERE.(ONLY 30 PT'S CAN BE ON SCHEDULE PER MB/MTB)

## 2017-11-10 ENCOUNTER — Ambulatory Visit: Payer: Medicare Other | Admitting: Cardiology

## 2017-11-10 ENCOUNTER — Encounter: Payer: Self-pay | Admitting: Cardiology

## 2017-11-10 VITALS — BP 128/66 | HR 92 | Ht 64.0 in | Wt 190.0 lb

## 2017-11-10 DIAGNOSIS — I10 Essential (primary) hypertension: Secondary | ICD-10-CM

## 2017-11-10 DIAGNOSIS — I482 Chronic atrial fibrillation, unspecified: Secondary | ICD-10-CM

## 2017-11-10 DIAGNOSIS — E7800 Pure hypercholesterolemia, unspecified: Secondary | ICD-10-CM

## 2017-11-10 DIAGNOSIS — Z9889 Other specified postprocedural states: Secondary | ICD-10-CM

## 2017-11-10 MED ORDER — METOPROLOL SUCCINATE ER 100 MG PO TB24: 100.0000 mg | ORAL_TABLET | Freq: Every day | ORAL | 3 refills | Status: DC

## 2017-11-10 MED ORDER — AMOXICILLIN 500 MG PO TABS: ORAL_TABLET | ORAL | 6 refills | Status: DC

## 2017-11-10 NOTE — Patient Instructions (Signed)
Medication Instructions:   SCRIPT FOR AMOXICILLIN SENT TO THE PHARMACY   Testing/Procedures:  Your physician has requested that you have an echocardiogram. Echocardiography is a painless test that uses sound waves to create images of your heart. It provides your doctor with information about the size and shape of your heart and how well your heart's chambers and valves are working. This procedure takes approximately one hour. There are no restrictions for this procedure.    Follow-Up:  Your physician wants you to follow-up in: [place] will receive a reminder letter in the mail two months in advance. If you don't receive a letter, please call our office to schedule the follow-up appointment.   If you need a refill on your cardiac medications before your next appointment, please call your pharmacy.

## 2017-11-12 ENCOUNTER — Other Ambulatory Visit: Payer: Self-pay

## 2017-11-12 ENCOUNTER — Ambulatory Visit (HOSPITAL_COMMUNITY): Payer: Medicare Other | Attending: Cardiology

## 2017-11-12 ENCOUNTER — Other Ambulatory Visit: Payer: Self-pay | Admitting: *Deleted

## 2017-11-12 DIAGNOSIS — Z952 Presence of prosthetic heart valve: Secondary | ICD-10-CM | POA: Insufficient documentation

## 2017-11-12 DIAGNOSIS — Z9889 Other specified postprocedural states: Secondary | ICD-10-CM | POA: Insufficient documentation

## 2017-11-12 DIAGNOSIS — I272 Pulmonary hypertension, unspecified: Secondary | ICD-10-CM | POA: Insufficient documentation

## 2017-11-12 DIAGNOSIS — M25571 Pain in right ankle and joints of right foot: Secondary | ICD-10-CM | POA: Diagnosis not present

## 2017-11-12 DIAGNOSIS — I10 Essential (primary) hypertension: Secondary | ICD-10-CM

## 2017-11-12 DIAGNOSIS — I517 Cardiomegaly: Secondary | ICD-10-CM | POA: Diagnosis not present

## 2017-11-12 MED ORDER — LISINOPRIL 40 MG PO TABS: 40.0000 mg | ORAL_TABLET | Freq: Every day | ORAL | 0 refills | Status: DC

## 2017-11-12 MED ORDER — ATORVASTATIN CALCIUM 40 MG PO TABS: 40.0000 mg | ORAL_TABLET | Freq: Every evening | ORAL | 0 refills | Status: DC

## 2017-11-12 NOTE — Telephone Encounter (Signed)
Rx sent to pharmacy   

## 2017-11-13 ENCOUNTER — Telehealth: Payer: Self-pay | Admitting: *Deleted

## 2017-11-13 DIAGNOSIS — R0609 Other forms of dyspnea: Principal | ICD-10-CM

## 2017-11-13 MED ORDER — FUROSEMIDE 20 MG PO TABS: 20.0000 mg | ORAL_TABLET | ORAL | 3 refills | Status: DC

## 2017-11-13 NOTE — Telephone Encounter (Signed)
Spoke with pt, Aware of dr crenshaw's recommendations. New script sent to the pharmacy and Lab orders mailed to the pt  

## 2017-11-13 NOTE — Telephone Encounter (Addendum)
Left message for pt to call   ----- Message from Lelon Perla, MD sent at 11/12/2017 11:39 AM EDT ----- Normal LV function; MVR ok Add lasix 20 mg every other day; bmet 2 weeks Kirk Ruths

## 2017-11-17 ENCOUNTER — Ambulatory Visit: Payer: Medicare Other | Admitting: General Surgery

## 2017-11-18 ENCOUNTER — Telehealth: Payer: Self-pay | Admitting: *Deleted

## 2017-11-18 NOTE — Telephone Encounter (Signed)
   [facility] Pre-operative Risk Assessment    Request for surgical clearance:  1. What type of surgery is being performed? HARDWARE REMOVAL RIGHT ANKLE    2. When is this surgery scheduled? 12/21/17   3. What type of clearance is required (medical clearance vs. Pharmacy clearance to hold med vs. Both)? BOTH  4. Are there any medications that need to be held prior to surgery and how long?COUMADIN   5. Practice name and name of physician performing surgery? [facility]   6. What is your office phone number [phone]     7.   What is your office fax number  [phone] [place]   8.   Anesthesia type (None, local, MAC,  general) ? CHOICE

## 2017-11-20 NOTE — Telephone Encounter (Signed)
Pt takes warfarin for mechanical mitral valve replacement for rheumatic heart disease and afib with CHADS2VASc score of 2 (sex, HTN). Pt will require bridging with Lovenox 1mg /kg BID in order to hold her warfarin for 5 days prior to procedure.  Will route message to Coumadin clinic in [place] who manages patient's INR to help coordinate Lovenox bridge.

## 2017-11-23 NOTE — Telephone Encounter (Signed)
   Primary Cardiologist: Dr Stanford Breed  Chart reviewed and patient interviewed over the phone as part of pre-operative protocol coverage. Given past medical history and time since last visit, based on ACC/AHA guidelines, [name] would be at acceptable risk for the planned procedure without further cardiovascular testing.   The [facility] will contact the patient about Lovenox to Heparin bridging pre op.   I will route this recommendation to the requesting party via Epic fax function and remove from pre-op pool.  Please call with questions.  Kerin Ransom, PA-C 11/23/2017, 2:15 PM

## 2017-11-23 NOTE — Telephone Encounter (Signed)
Left message TCB

## 2017-11-30 ENCOUNTER — Telehealth: Payer: Self-pay | Admitting: Cardiovascular Disease

## 2017-11-30 ENCOUNTER — Other Ambulatory Visit
Admission: RE | Admit: 2017-11-30 | Discharge: 2017-11-30 | Disposition: A | Discharge status: Home / Self Care | Payer: Medicare Other | Source: Ambulatory Visit | Attending: Cardiovascular Disease | Admitting: Cardiovascular Disease

## 2017-11-30 ENCOUNTER — Other Ambulatory Visit
Admission: RE | Admit: 2017-11-30 | Discharge: 2017-11-30 | Disposition: A | Discharge status: Home / Self Care | Payer: Medicare Other | Source: Ambulatory Visit | Attending: Cardiology | Admitting: Cardiology

## 2017-11-30 ENCOUNTER — Ambulatory Visit (INDEPENDENT_AMBULATORY_CARE_PROVIDER_SITE_OTHER): Payer: Medicare Other

## 2017-11-30 DIAGNOSIS — R0609 Other forms of dyspnea: Secondary | ICD-10-CM | POA: Diagnosis not present

## 2017-11-30 DIAGNOSIS — R791 Abnormal coagulation profile: Secondary | ICD-10-CM | POA: Insufficient documentation

## 2017-11-30 DIAGNOSIS — Z5181 Encounter for therapeutic drug level monitoring: Secondary | ICD-10-CM | POA: Diagnosis not present

## 2017-11-30 DIAGNOSIS — I4891 Unspecified atrial fibrillation: Secondary | ICD-10-CM

## 2017-11-30 DIAGNOSIS — I059 Rheumatic mitral valve disease, unspecified: Secondary | ICD-10-CM | POA: Insufficient documentation

## 2017-11-30 DIAGNOSIS — I482 Chronic atrial fibrillation, unspecified: Secondary | ICD-10-CM

## 2017-11-30 DIAGNOSIS — Z952 Presence of prosthetic heart valve: Secondary | ICD-10-CM | POA: Insufficient documentation

## 2017-11-30 DIAGNOSIS — Z9889 Other specified postprocedural states: Secondary | ICD-10-CM | POA: Diagnosis not present

## 2017-11-30 LAB — BASIC METABOLIC PANEL
Anion gap: 7 (ref 5–15)
BUN: 13 mg/dL (ref 6–20)
CHLORIDE: 103 mmol/L (ref 101–111)
CO2: 27 mmol/L (ref 22–32)
Calcium: 8.7 mg/dL — ABNORMAL LOW (ref 8.9–10.3)
Creatinine, Ser: 0.82 mg/dL (ref 0.44–1.00)
GFR calc Af Amer: >60 mL/min (ref >60)
GFR calc non Af Amer: >60 mL/min (ref >60)
Glucose, Bld: 100 mg/dL — ABNORMAL HIGH (ref 65–99)
Potassium: 4.1 mmol/L (ref 3.5–5.1)
SODIUM: 137 mmol/L (ref 135–145)

## 2017-11-30 LAB — POCT INR: INR: 7.7 — AB (ref 2.0–3.0)

## 2017-11-30 LAB — PROTIME-INR
INR: 4.88 — AB
Prothrombin Time: 45.2 seconds — ABNORMAL HIGH (ref 11.4–15.2)

## 2017-11-30 MED ORDER — ENOXAPARIN SODIUM 80 MG/0.8ML ~~LOC~~ SOLN: 80.0000 mg | Freq: Two times a day (BID) | SUBCUTANEOUS | 0 refills | Status: DC

## 2017-11-30 MED ORDER — ENOXAPARIN SODIUM 80 MG/0.8ML ~~LOC~~ SOLN: 80.0000 mg | Freq: Two times a day (BID) | SUBCUTANEOUS | 24 refills | Status: DC

## 2017-11-30 NOTE — Telephone Encounter (Signed)
Spoke w/ Lovena Le.  Clarified that pt is to be on 80mg /ml of lovenox, #20 syringes and that pt has been provided w/ instructions.  She is appreciative and asks that I resend rx over for documentation.

## 2017-11-30 NOTE — Telephone Encounter (Signed)
Todd from [employer] for insurance purposes they will need a clarification They will need an escript with the quantity and how long she will be using it  Please advise

## 2017-11-30 NOTE — Patient Instructions (Signed)
6/25: Last dose of Coumadin.  6/26: No Coumadin or Lovenox.  6/27: Inject Lovenox 80mg  in the fatty abdominal tissue at least 2 inches from the belly button twice a day about 12 hours apart, 8am and 8pm rotate sites. No Coumadin.  6/28: Inject Lovenox in the fatty tissue every 12 hours, 8am and 8pm. No Coumadin.  6/29: Inject Lovenox in the fatty tissue every 12 hours, 8am and 8pm. No Coumadin.  6/30: Inject Lovenox in the fatty tissue in the morning at 8 am (No PM dose). No Coumadin.   7/1: Procedure Day - No Lovenox - Resume Coumadin in the evening or as directed by doctor (take an extra half tablet with usual dose for 2 days then resume normal dose).  7/2: Resume Lovenox inject in the fatty tissue every 12 hours and take Coumadin.  7/3: Inject Lovenox in the fatty tissue every 12 hours and take Coumadin.  7/4: Inject Lovenox in the fatty tissue every 12 hours and take Coumadin.  7/5: Inject Lovenox in the fatty tissue every 12 hours and take Coumadin.  7/6: Inject Lovenox in the fatty tissue every 12 hours and take Coumadin.  7/7: Inject Lovenox in the fatty tissue every 12 hours and take Coumadin.  7/8: Coumadin appt to check INR.

## 2017-12-07 ENCOUNTER — Ambulatory Visit (INDEPENDENT_AMBULATORY_CARE_PROVIDER_SITE_OTHER): Payer: Medicare Other

## 2017-12-07 DIAGNOSIS — I482 Chronic atrial fibrillation, unspecified: Secondary | ICD-10-CM

## 2017-12-07 DIAGNOSIS — Z5181 Encounter for therapeutic drug level monitoring: Secondary | ICD-10-CM | POA: Diagnosis not present

## 2017-12-07 DIAGNOSIS — I4891 Unspecified atrial fibrillation: Secondary | ICD-10-CM | POA: Diagnosis not present

## 2017-12-07 DIAGNOSIS — Z9889 Other specified postprocedural states: Secondary | ICD-10-CM

## 2017-12-07 DIAGNOSIS — I059 Rheumatic mitral valve disease, unspecified: Secondary | ICD-10-CM

## 2017-12-07 LAB — POCT INR: INR: 3 (ref 2.0–3.0)

## 2017-12-07 NOTE — Patient Instructions (Signed)
Please continue previous dosage of 1 tablet daily except 1/2 tablet on Sundays & Thursdays.  For upcoming knee surgery on 7/1, please follow Lovenox bridging instructions provided at last visit. Recheck INR on 12/28/17.

## 2017-12-10 ENCOUNTER — Telehealth: Payer: Self-pay | Admitting: Cardiology

## 2017-12-10 NOTE — Telephone Encounter (Signed)
Returned call to pt, ankle surgery has been rescheduled from 12/21/17 to 12/28/17, advised pt to get instructions [facility] provided at last appt.  We changed dates on sheet, last dosage of Warfarin on 12/22/17, changed instruction sheet day by day will hold Warfarin 7/3-7/7, start Lovenox on 12/24/17 every 12 hrs, last dosage of Lovenox on 12/27/17 prior to surgery in the am.  Resume Warfarin per MD instruction on 12/28/17, Resume Lovenox BID on 12/29/17 continue until recheck on 01/01/18 at [place] in [place] office.  Pt edited instruction sheet from [place] on 12/07/17, verbalized understanding.

## 2017-12-10 NOTE — Telephone Encounter (Signed)
Pt calling stating she was to have knee surgery on 12/21/17 She was already given a schedule for when to take her Lovenox  But not that is has been rescheduled to 12/28/17 she needs a call back with new instructions on that  Please call back

## 2017-12-15 ENCOUNTER — Encounter: Payer: Self-pay | Admitting: General Surgery

## 2017-12-15 ENCOUNTER — Ambulatory Visit: Payer: Medicare Other | Admitting: General Surgery

## 2017-12-15 VITALS — BP 140/84 | HR 80 | Resp 14 | Ht 64.0 in | Wt 186.0 lb

## 2017-12-15 DIAGNOSIS — Z1231 Encounter for screening mammogram for malignant neoplasm of breast: Secondary | ICD-10-CM

## 2017-12-15 DIAGNOSIS — R920 Mammographic microcalcification found on diagnostic imaging of breast: Secondary | ICD-10-CM | POA: Diagnosis not present

## 2017-12-15 NOTE — Progress Notes (Signed)
Patient ID: [name], female   DOB: 1956/04/08, 62 y.o.   MRN: [id number]  Chief Complaint  Patient presents with  . Follow-up    HPI [name] is a 62 y.o. female who presents for a breast evaluation. The most recent mammogram was done on 11/06/2017 .  Patient does perform regular self breast checks and gets regular mammograms done. Patient will have surgery on her right ankle in July.   HPI  Past Medical History:  Diagnosis Date  . Anticoagulated on warfarin   . Arthritis   . Chronic atrial fibrillation ([place])    SINCE 1997  . Dysrhythmia   . Goiter    multinodular  . Heart murmur   . History of hyperthyroidism    2003--  PTU [place]  . Hypercholesterolemia   . Hyperlipidemia   . Hyperthyroidism    H/O  . Pulmonary hypertension, mild ([place])   . Rheumatic heart disease    CARDIOLOGIST-- DR Marcello Moores WALL  . S/P MVR (mitral valve replacement)    1997  [facility]--  SECONDARY TO SEVERE MV STENOSIS    Past Surgical History:  Procedure Laterality Date  . ANTERIOR CERVICAL DECOMP/DISCECTOMY FUSION  12-21-2001   C5 --  C6  . BREAST BIOPSY Left 11/03/2016   Procedure: BREAST BIOPSY WITH NEEDLE LOCALIZATION;  Surgeon: Robert Bellow, MD;  Location: [facility] ORS;  Service: General;  Laterality: Left;  . BREAST EXCISIONAL BIOPSY Left 11/03/2016   excision of calcs. benign  . CARDIAC CATHETERIZATION  12-28-2006  DR [facility]   NORMAL CORONARY ARTERIES  . CARPAL TUNNEL RELEASE Right 01-20-2007  . COLONOSCOPY  2015  . ERCP  08/15/2011   Procedure: ENDOSCOPIC RETROGRADE CHOLANGIOPANCREATOGRAPHY (ERCP);  Surgeon: Jeryl Columbia, MD;  Location: [facility] ENDOSCOPY;  Service: Endoscopy;  Laterality: N/A;  . ERCP     MULTIPLE--  INCLUDING STENTS, SPHINTEROTOMY'S  STONE REMOVAL   . FEMUR IM NAIL Right 04/26/2014   Procedure: INTRAMEDULLARY (IM) NAIL FEMORAL;  Surgeon: Alta Corning, MD;  Location: [place];  Service: Orthopedics;  Laterality: Right;  . I&D EXTREMITY Right 02/07/2013   Procedure:  IRRIGATION AND DEBRIDEMENT OF RIGHT LOWER LEG WITH PLACEMENT OF ACELL AND WOULND VAC;  Surgeon: Theodoro Kos, DO;  Location: [place];  Service: Plastics;  Laterality: Right;  . LAPAROSCOPIC CHOLECYSTECTOMY  04-08-2002  . MITRAL VALVE REPLACEMENT  1997   [facility] [place]  . ORIF ANKLE FRACTURE Right 11/05/2012   Procedure: OPEN REDUCTION INTERNAL FIXATION (ORIF) ANKLE FRACTURE only operating on right;  Surgeon: Alta Corning, MD;  Location: [place];  Service: Orthopedics;  Laterality: Right;  . ORIF RIGHT ULNAR FX W/ ILIAC CREST BONE GRAFT  10-06-2008  . ORIF WRIST FRACTURE Right 04/26/2014   Procedure: OPEN REDUCTION INTERNAL FIXATION (ORIF) WRIST FRACTURE;  Surgeon: Alta Corning, MD;  Location: [employer];  Service: Orthopedics;  Laterality: Right;  . TRANSTHORACIC ECHOCARDIOGRAM  06-29-2009  dr wall   normal lvsf/ ef 55-60%/  normal bileaflet mechanical mvr/ moderated dilated la/ moderate tr  . WRIST ARTHROSCOPY Right 12-10-2007   debridement and ulnar shortening osteotomy w/ plate    Family History  Problem Relation Age of Onset  . Hypothyroidism Sister   . Gallbladder disease Sister   . Coronary artery disease Unknown   . Diabetes Mother   . Hypertension Mother   . Heart disease Father   . Stroke Father   . Breast cancer Neg Hx     Social History Social  History   Tobacco Use  . Smoking status: Never Smoker  . Smokeless tobacco: Never Used  Substance Use Topics  . Alcohol use: No  . Drug use: No    No Known Allergies  Current Outpatient Medications  Medication Sig Dispense Refill  . acyclovir (ZOVIRAX) 400 MG tablet Take 1 tablet (400 mg total) by mouth 3 (three) times daily. For 5 to 10 days or until healed for fever blister 30 tablet 1  . alendronate (FOSAMAX) 35 MG tablet Take 1 tablet (35 mg total) by mouth every 7 (seven) days. Take with a full glass of water on an empty stomach. 12 tablet 3  . amitriptyline (ELAVIL) 50 MG tablet Take 50 mg by mouth at bedtime  as needed for sleep.     Marland Kitchen amoxicillin (AMOXIL) 500 MG tablet Take all 4 tablets one hour prior to procedure 4 tablet 6  . aspirin EC 81 MG tablet Take 1 tablet (81 mg total) by mouth daily. 90 tablet 3  . atorvastatin (LIPITOR) 40 MG tablet Take 1 tablet (40 mg total) by mouth every evening. 90 tablet 0  . calcium carbonate (OSCAL) 1500 (600 Ca) MG TABS tablet Take 1 tablet by mouth daily.     . cetirizine (ZYRTEC) 10 MG tablet Take 1 tablet (10 mg total) by mouth daily. 30 tablet 11  . Cholecalciferol (VITAMIN D3) 2000 units capsule Take 1 capsule (2,000 Units total) by mouth daily.    . cycloSPORINE (RESTASIS) 0.05 % ophthalmic emulsion Place 1 drop into both eyes 2 (two) times daily as needed (dry eyes).    Marland Kitchen dicyclomine (BENTYL) 10 MG capsule Take 1 capsule (10 mg total) by mouth 4 (four) times daily -  before meals and at bedtime. (Patient taking differently: Take 10 mg by mouth at bedtime. ) 90 capsule 3  . enoxaparin (LOVENOX) 80 MG/0.8ML injection Inject 0.8 mLs (80 mg total) into the skin every 12 (twelve) hours. 20 Syringe 0  . fluticasone (FLONASE) 50 MCG/ACT nasal spray Place 2 sprays into both nostrils daily. 16 g 11  . furosemide (LASIX) 20 MG tablet Take 1 tablet (20 mg total) by mouth every other day. 45 tablet 3  . ibuprofen (ADVIL,MOTRIN) 200 MG tablet Take 200 mg by mouth 2 (two) times daily as needed for headache or moderate pain.    Marland Kitchen lisinopril (PRINIVIL,ZESTRIL) 40 MG tablet Take 1 tablet (40 mg total) by mouth daily. 90 tablet 0  . metoprolol succinate (TOPROL-XL) 100 MG 24 hr tablet Take 1 tablet (100 mg total) by mouth daily. Take with or immediately following a meal. 90 tablet 3  . metoprolol succinate (TOPROL-XL) 100 MG 24 hr tablet Take 100 mg by mouth daily. Take with or immediately following a meal.    . Multiple Vitamins-Minerals (CENTRUM SILVER ADULT 50+ PO) Take 1 tablet by mouth daily.    Marland Kitchen warfarin (COUMADIN) 5 MG tablet Take 1/2 to 1 tablet by mouth daily as  directed by coumadin clinic 90 tablet 1   No current facility-administered medications for this visit.     Review of Systems Review of Systems  Constitutional: Negative.   Respiratory: Negative.   Cardiovascular: Negative.     Blood pressure 140/84, pulse 80, resp. rate 14, height 5\' 4"  (1.626 m), weight 186 lb (84.4 kg).  Physical Exam Physical Exam  Constitutional: She is oriented to person, place, and time. She appears well-developed and well-nourished.  Eyes: Conjunctivae are normal. No scleral icterus.  Neck: Neck supple.  Cardiovascular: Normal rate, regular rhythm and normal heart sounds.  Pulmonary/Chest: Effort normal and breath sounds normal. Right breast exhibits no inverted nipple, no mass, no nipple discharge, no skin change and no tenderness. Left breast exhibits no inverted nipple, no mass, no nipple discharge, no skin change and no tenderness.    Lymphadenopathy:    She has no cervical adenopathy.    She has no axillary adenopathy.  Neurological: She is alert and oriented to person, place, and time.  Skin: Skin is warm and dry.    Data Reviewed Nov 03, 2016 biopsy completed for microcalcifications:  DIAGNOSIS:  A. LEFT BREAST, UPPER OUTER QUADRANT; NEEDLE LOCALIZED EXCISION:  - COLUMNAR CELL LESION WITH MICROCALCIFICATIONS.  - PSEUDO-ANGIOMATOUS STROMAL HYPERPLASIA.  - CHRONIC INFLAMMATION.  - NEGATIVE FOR ATYPIA AND MALIGNANCY.  Nov 06, 2017 bilateral screening mammograms: These films were reviewed.  Resolution of previously noted microcalcifications.  BI-RADS-1.  Assessment    Benign breast exam.    Plan Patient to return to her PCP for mammograms and breast checks. The patient is aware to call back for any questions or concerns   HPI, Physical Exam, Assessment and Plan have been scribed under the direction and in the presence of Hervey Ard, MD.  Gaspar Cola, CMA   I have completed the exam and reviewed the above documentation for  accuracy and completeness.  I agree with the above.  Haematologist has been used and any errors in dictation or transcription are unintentional.  Hervey Ard, M.D., F.A.C.S.  Forest Gleason [place] 12/15/2017, 8:43 PM

## 2017-12-15 NOTE — Patient Instructions (Addendum)
Patient to return to her PCP for mammograms and breast checks.  The patient is aware to call back for any questions or concerns.  

## 2017-12-17 ENCOUNTER — Ambulatory Visit (INDEPENDENT_AMBULATORY_CARE_PROVIDER_SITE_OTHER): Payer: Medicare Other | Admitting: Family Medicine

## 2017-12-17 ENCOUNTER — Encounter: Payer: Self-pay | Admitting: Family Medicine

## 2017-12-17 ENCOUNTER — Telehealth: Payer: Self-pay | Admitting: Cardiology

## 2017-12-17 ENCOUNTER — Other Ambulatory Visit: Payer: Self-pay | Admitting: Family Medicine

## 2017-12-17 VITALS — BP 122/77 | HR 77 | Temp 98.3°F | Resp 16 | Ht 64.0 in | Wt 187.0 lb

## 2017-12-17 DIAGNOSIS — E7800 Pure hypercholesterolemia, unspecified: Secondary | ICD-10-CM

## 2017-12-17 DIAGNOSIS — M8589 Other specified disorders of bone density and structure, multiple sites: Secondary | ICD-10-CM | POA: Diagnosis not present

## 2017-12-17 DIAGNOSIS — Z79890 Hormone replacement therapy (postmenopausal): Secondary | ICD-10-CM | POA: Diagnosis not present

## 2017-12-17 DIAGNOSIS — R7309 Other abnormal glucose: Secondary | ICD-10-CM

## 2017-12-17 DIAGNOSIS — I10 Essential (primary) hypertension: Secondary | ICD-10-CM

## 2017-12-17 DIAGNOSIS — I272 Pulmonary hypertension, unspecified: Secondary | ICD-10-CM

## 2017-12-17 DIAGNOSIS — J3089 Other allergic rhinitis: Secondary | ICD-10-CM | POA: Diagnosis not present

## 2017-12-17 DIAGNOSIS — Z7901 Long term (current) use of anticoagulants: Secondary | ICD-10-CM

## 2017-12-17 DIAGNOSIS — E559 Vitamin D deficiency, unspecified: Secondary | ICD-10-CM

## 2017-12-17 DIAGNOSIS — Z952 Presence of prosthetic heart valve: Secondary | ICD-10-CM

## 2017-12-17 DIAGNOSIS — Z13820 Encounter for screening for osteoporosis: Secondary | ICD-10-CM | POA: Diagnosis not present

## 2017-12-17 DIAGNOSIS — M85852 Other specified disorders of bone density and structure, left thigh: Secondary | ICD-10-CM | POA: Diagnosis not present

## 2017-12-17 DIAGNOSIS — Z0000 Encounter for general adult medical examination without abnormal findings: Secondary | ICD-10-CM

## 2017-12-17 DIAGNOSIS — I482 Chronic atrial fibrillation, unspecified: Secondary | ICD-10-CM

## 2017-12-17 DIAGNOSIS — Z780 Asymptomatic menopausal state: Secondary | ICD-10-CM | POA: Diagnosis not present

## 2017-12-17 DIAGNOSIS — Z8639 Personal history of other endocrine, nutritional and metabolic disease: Secondary | ICD-10-CM

## 2017-12-17 DIAGNOSIS — M8588 Other specified disorders of bone density and structure, other site: Secondary | ICD-10-CM | POA: Diagnosis not present

## 2017-12-17 LAB — POCT GLYCOSYLATED HEMOGLOBIN (HGB A1C): Hemoglobin A1C: 5.6 % (ref 4.0–5.6)

## 2017-12-17 LAB — HM DEXA SCAN

## 2017-12-17 MED ORDER — FLUTICASONE PROPIONATE 50 MCG/ACT NA SUSP: 2.0000 | Freq: Every day | NASAL | 11 refills | Status: DC

## 2017-12-17 MED ORDER — CETIRIZINE HCL 10 MG PO TABS: 10.0000 mg | ORAL_TABLET | Freq: Every day | ORAL | 11 refills | Status: DC

## 2017-12-17 MED ORDER — ALENDRONATE SODIUM 35 MG PO TABS: 35.0000 mg | ORAL_TABLET | ORAL | 3 refills | Status: DC

## 2017-12-17 NOTE — Assessment & Plan Note (Signed)
Stable. Refill Flonase.  

## 2017-12-17 NOTE — Telephone Encounter (Signed)
Pt calling stating she was to have surgery on 12/28/17 She was already given a schedule for when to take her Lovenox   But not that is has been rescheduled to 01/11/18 she needs a call back with new instructions on that  Please call back

## 2017-12-17 NOTE — Patient Instructions (Addendum)
Thank you for coming to the office today.  A1c 5.6  For DEXA Scan (Bone mineral density) screening for osteoporosis  Call the [place] below anytime to schedule your own appointment now that order has been placed.  [facility] [place], [place] [address] Phone: [phone]  They may need record from [place] for [place] (no food or drink after midnight before the lab appointment, only water or coffee without cream/sugar on the morning of)  SCHEDULE "Lab Only" visit in the morning at the clinic for lab draw in 6 MONTHS   - Make sure Lab Only appointment is at about 1 week before your next appointment, so that results will be available  For Lab Results, once available within 2-3 days of blood draw, you can can log in to MyChart online to view your results and a brief explanation. Also, we can discuss results at next follow-up visit.   Please schedule a Follow-up Appointment to: Return in about 6 months (around 06/18/2018) for Annual Physical.  If you have any other questions or concerns, please feel free to call the office or send a message through [place]. You may also schedule an earlier appointment if necessary.  Additionally, you may be receiving a survey about your experience at our office within a few days to 1 week by e-mail or mail. We value your feedback.  Nobie Putnam, DO [place]

## 2017-12-17 NOTE — Assessment & Plan Note (Signed)
Stable osteopenia without complication or new fracture - Dx by DEXA 09/2015, continues on bisphosphonate prophylaxis dose weekly since 05/2016 - On calcium supplement and vitamin D therapy (low vit D as well) - Concern high risk with prior hip and ankle fractures in past  Plan: 1. Continue Alendronate bisphosphonate 35mg  weekly (started 05/2016) 2. Re-order DEXA today - to be done at [facility] instead of [facility] - she will call to schedule 3. Continue Vitamin D3 2,000 iu daily maintenance 4. Follow-up q 6 months

## 2017-12-17 NOTE — Assessment & Plan Note (Signed)
Stable to mild elevated A1c 5.5 up to 5.6, with evidence of hyperglycemia At risk for possible new dx Pre-DM in future if still worsening  Plan:  1. Not on any therapy currently - remain off 2. Encourage improved lifestyle - low carb, low sugar diet, limit sweet tea intake, reduce portion size, continue improving regular exercise 3. Follow-up 6 months annual and labs

## 2017-12-17 NOTE — Progress Notes (Signed)
Subjective:    Patient ID: [name], female    DOB: 06-12-1956, 62 y.o.   MRN: [id number]  [name] is a 62 y.o. female presenting on 12/17/2017 for Hyperglycemia   HPI   Follow-up Elevated A1c - No prior history of Pre-Diabetes or Diabetes. Recent trend increased A1c from 4.4 to 5.5 in 05/2017 - Today POC A1c similar 5.6 - She endorses trying to improve diet overall low carb, still admits drinking sweet tea, but trying to drink more water - Not regularly exercising due to problem with Right Ankle and upcoming proposed surgery ortho to remove hardware Denies numbness tingling weakness polyuria, hypoglycemia  Osteopenia Last DEXA 09/2015, [facility], with T score -1.5 to -2.3 She has remained on prophylaxis dose of Alendronate 35mg  weekly, tolerating well, needs refill Due for next DEXA q 2 years, asking if can switch to [facility] Denies fall or fracture  Allergic Rhinitis - Needs refill Flonase  Additional updates - Followed by [facility] Dr Stanford Breed last seen 10/2017, continues to f/u with [facility], she had ECHO on 11/12/17 with normal LVEF and mild mod pulm HTN, lasix 20mg  every other day was added. Otherwise remains on anticoagulation with coumadin for s/p MVR, rate control for permanent AFib.   Depression screen [facility] 2/9 12/17/2017 05/26/2017 01/27/2017  Decreased Interest 0 0 0  Down, Depressed, Hopeless 0 0 0  PHQ - 2 Score 0 0 0  Altered sleeping - - -  Tired, decreased energy - - -  Change in appetite - - -  Feeling bad or failure about yourself  - - -  Trouble concentrating - - -  Moving slowly or fidgety/restless - - -  Suicidal thoughts - - -  PHQ-9 Score - - -  Difficult doing work/chores - - -  Some recent data might be hidden    Social History   Tobacco Use  . Smoking status: Never Smoker  . Smokeless tobacco: Never Used  Substance Use Topics  . Alcohol use: No  . Drug use: No    Review of Systems Per HPI  unless specifically indicated above     Objective:    BP 122/77   Pulse 77   Temp 98.3 F (36.8 C) (Oral)   Resp 16   Ht 5\' 4"  (1.626 m)   Wt 187 lb (84.8 kg)   BMI 32.10 kg/m   Wt Readings from Last 3 Encounters:  12/17/17 187 lb (84.8 kg)  12/15/17 186 lb (84.4 kg)  11/10/17 190 lb (86.2 kg)    Physical Exam  Constitutional: She is oriented to person, place, and time. She appears well-developed and well-nourished. No distress.  Well-appearing, comfortable, cooperative  HENT:  Head: Normocephalic and atraumatic.  Mouth/Throat: Oropharynx is clear and moist.  Eyes: Conjunctivae are normal. Right eye exhibits no discharge. Left eye exhibits no discharge.  Neck: Neck supple. No thyromegaly present.  Cardiovascular: Normal rate, normal heart sounds and intact distal pulses.  No murmur heard. Irregularly irregular rhythm. Mechanical heart valve clicking   Pulmonary/Chest: Effort normal. No respiratory distress.  Musculoskeletal: Normal range of motion. She exhibits no tenderness.  Neurological: She is alert and oriented to person, place, and time.  Skin: Skin is warm and dry. No rash noted. She is not diaphoretic. No erythema.  Psychiatric: She has a normal mood and affect. Her behavior is normal.  Well groomed, good eye contact, normal speech and thoughts  Nursing note and vitals reviewed.  Recent Labs    05/26/17 0809 12/17/17 0827  HGBA1C 5.5 5.6    Results for orders placed or performed in visit on 12/17/17  POCT HgB A1C  Result Value Ref Range   Hemoglobin A1C 5.6 4.0 - 5.6 %   HbA1c POC (<> result, manual entry)  4.0 - 5.6 %   HbA1c, POC (prediabetic range)  5.7 - 6.4 %   HbA1c, POC (controlled diabetic range)  0.0 - 7.0 %      Assessment & Plan:   Problem List Items Addressed This Visit    Allergic rhinitis    Stable Refill Flonase      Relevant Medications   fluticasone (FLONASE) 50 MCG/ACT nasal spray   cetirizine (ZYRTEC) 10 MG tablet   Elevated  hemoglobin A1c - Primary    Stable to mild elevated A1c 5.5 up to 5.6, with evidence of hyperglycemia At risk for possible new dx Pre-DM in future if still worsening  Plan:  1. Not on any therapy currently - remain off 2. Encourage improved lifestyle - low carb, low sugar diet, limit sweet tea intake, reduce portion size, continue improving regular exercise 3. Follow-up 6 months annual and labs      Relevant Orders   POCT HgB A1C (Completed)   Osteopenia    Stable osteopenia without complication or new fracture - Dx by DEXA 09/2015, continues on bisphosphonate prophylaxis dose weekly since 05/2016 - On calcium supplement and vitamin D therapy (low vit D as well) - Concern high risk with prior hip and ankle fractures in past  Plan: 1. Continue Alendronate bisphosphonate 35mg  weekly (started 05/2016) 2. Re-order DEXA today - to be done at [facility] instead of [facility] - she will call to schedule 3. Continue Vitamin D3 2,000 iu daily maintenance 4. Follow-up q 6 months      Relevant Medications   alendronate (FOSAMAX) 35 MG tablet   Other Relevant Orders   DG BONE DENSITY (DXA)      Meds ordered this encounter  Medications  . fluticasone (FLONASE) 50 MCG/ACT nasal spray    Sig: Place 2 sprays into both nostrils daily.    Dispense:  16 g    Refill:  11  . alendronate (FOSAMAX) 35 MG tablet    Sig: Take 1 tablet (35 mg total) by mouth every 7 (seven) days. Take with a full glass of water on an empty stomach.    Dispense:  12 tablet    Refill:  3  . cetirizine (ZYRTEC) 10 MG tablet    Sig: Take 1 tablet (10 mg total) by mouth daily.    Dispense:  30 tablet    Refill:  11   Orders Placed This Encounter  Procedures  . DG BONE DENSITY (DXA)    Standing Status:   Future    Standing Expiration Date:   02/17/2019    Order Specific Question:   Reason for Exam (SYMPTOM  OR DIAGNOSIS REQUIRED)    Answer:   osteopenia, last DEXA done at [facility] 2017, on bisphosphonate    Order  Specific Question:   Preferred imaging location?    Answer:   [facility]  . POCT HgB A1C    Follow up plan: Return in about 6 months (around 06/18/2018) for Annual Physical.  Future labs ordered for 06/01/18  Nobie Putnam, [facility] 12/17/2017, 4:14 PM

## 2017-12-17 NOTE — Telephone Encounter (Signed)
Spoke with patient and will continue usual coumadin dose and bring her into clinic to recheck INR on 7/10 and go over lovenox dosing instructions at that time. She states understanding.

## 2017-12-23 ENCOUNTER — Encounter: Payer: Self-pay | Admitting: Family Medicine

## 2017-12-30 ENCOUNTER — Ambulatory Visit (INDEPENDENT_AMBULATORY_CARE_PROVIDER_SITE_OTHER): Payer: Medicare Other | Admitting: Pharmacist

## 2017-12-30 DIAGNOSIS — I4891 Unspecified atrial fibrillation: Secondary | ICD-10-CM

## 2017-12-30 DIAGNOSIS — Z7901 Long term (current) use of anticoagulants: Secondary | ICD-10-CM

## 2017-12-30 DIAGNOSIS — Z9889 Other specified postprocedural states: Secondary | ICD-10-CM | POA: Diagnosis not present

## 2017-12-30 DIAGNOSIS — Z5181 Encounter for therapeutic drug level monitoring: Secondary | ICD-10-CM

## 2017-12-30 DIAGNOSIS — I482 Chronic atrial fibrillation, unspecified: Secondary | ICD-10-CM

## 2017-12-30 DIAGNOSIS — I059 Rheumatic mitral valve disease, unspecified: Secondary | ICD-10-CM

## 2017-12-30 LAB — POCT INR: INR: 2.6 (ref 2.0–3.0)

## 2017-12-30 NOTE — Patient Instructions (Addendum)
Description   Please Take 1.5 tablets today then continue previous dosage of 1 tablet daily except 1/2 tablet on Sundays & Thursdays.  For upcoming knee surgery on 7/22, please follow Lovenox bridging instructions. Recheck INR on 01/18/18 post procedure.      01/05/18: Last dose of Coumadin.  01/06/18: No Coumadin or Lovenox.  01/07/18: Inject Lovenox 80mg  in the fatty abdominal tissue at least 2 inches from the belly button twice a day about 12 hours apart, 8am and 8pm rotate sites. No Coumadin.  01/08/18: Inject Lovenox in the fatty tissue every 12 hours, 8am and 8pm. No Coumadin.  01/09/18: Inject Lovenox in the fatty tissue every 12 hours, 8am and 8pm. No Coumadin.  01/10/18: Inject Lovenox in the fatty tissue in the AM, 8am (No PM dose). No Coumadin.  01/11/18: Procedure Day - No Lovenox - Resume Coumadin in the evening or as directed by doctor (take an extra half tablet with usual dose for 2 days then resume normal dose).  01/12/18: Resume Lovenox inject in the fatty tissue every 12 hours and take Coumadin.  01/13/18: Inject Lovenox in the fatty tissue every 12 hours and take Coumadin.  01/14/18: Inject Lovenox in the fatty tissue every 12 hours and take Coumadin.  01/15/18: Inject Lovenox in the fatty tissue every 12 hours and take Coumadin.  01/16/18: Inject Lovenox in the fatty tissue every 12 hours and take Coumadin.  01/17/18: Inject Lovenox in the fatty tissue every 12 hours and take Coumadin.  01/18/18: Coumadin appt to check INR.

## 2018-01-06 ENCOUNTER — Telehealth: Payer: Self-pay | Admitting: Cardiology

## 2018-01-06 NOTE — Telephone Encounter (Signed)
Initial clearance call did not mention aspirin (phone note from 12/21/17). Will route to preop pool for clearance of hold for aspirin.

## 2018-01-06 NOTE — Telephone Encounter (Signed)
New Message   Patient is calling because she is having an upcoming surgery. She is aware of the instructions with the Lovenox. But she wants to know about stopping the aspirin.

## 2018-01-06 NOTE — Telephone Encounter (Signed)
Did not need this chart 

## 2018-01-06 NOTE — Telephone Encounter (Signed)
Will ask Dr Stanford Breed to comment on holding ASA pre op.  Kerin Ransom PA-C 01/06/2018 2:45 PM

## 2018-01-07 NOTE — Telephone Encounter (Signed)
Ok to hold asa prior to surgery and resume after [facility]

## 2018-01-08 NOTE — Telephone Encounter (Signed)
Ok to let pt know to hold asa prior to surgery and resume after.

## 2018-01-11 DIAGNOSIS — T8489XA Other specified complication of internal orthopedic prosthetic devices, implants and grafts, initial encounter: Secondary | ICD-10-CM | POA: Diagnosis not present

## 2018-01-11 DIAGNOSIS — Z96661 Presence of right artificial ankle joint: Secondary | ICD-10-CM | POA: Diagnosis not present

## 2018-01-11 DIAGNOSIS — Z967 Presence of other bone and tendon implants: Secondary | ICD-10-CM | POA: Diagnosis not present

## 2018-01-11 DIAGNOSIS — Z472 Encounter for removal of internal fixation device: Secondary | ICD-10-CM | POA: Diagnosis not present

## 2018-01-11 DIAGNOSIS — M79671 Pain in right foot: Secondary | ICD-10-CM | POA: Diagnosis not present

## 2018-01-11 NOTE — Telephone Encounter (Signed)
I s/w Pharm-d Eino Farber to confirm how many days did ASA need to be held. While s/w Pharm D pt's surgery was scheduled for today. I will remove from Pre Op Call back pool.

## 2018-01-18 ENCOUNTER — Ambulatory Visit (INDEPENDENT_AMBULATORY_CARE_PROVIDER_SITE_OTHER): Payer: Medicare Other

## 2018-01-18 DIAGNOSIS — Z9889 Other specified postprocedural states: Secondary | ICD-10-CM | POA: Diagnosis not present

## 2018-01-18 DIAGNOSIS — I059 Rheumatic mitral valve disease, unspecified: Secondary | ICD-10-CM | POA: Diagnosis not present

## 2018-01-18 DIAGNOSIS — I482 Chronic atrial fibrillation, unspecified: Secondary | ICD-10-CM

## 2018-01-18 DIAGNOSIS — I4891 Unspecified atrial fibrillation: Secondary | ICD-10-CM | POA: Diagnosis not present

## 2018-01-18 DIAGNOSIS — Z5181 Encounter for therapeutic drug level monitoring: Secondary | ICD-10-CM

## 2018-01-18 LAB — POCT INR: INR: 2.6 (ref 2.0–3.0)

## 2018-01-18 NOTE — Patient Instructions (Signed)
Please take 1.5 tablets today then continue previous dosage of 1 tablet daily except 1/2 tablet on Sundays & Thursdays.  Recheck INR in 2 weeks.

## 2018-01-19 DIAGNOSIS — Z9889 Other specified postprocedural states: Secondary | ICD-10-CM | POA: Diagnosis not present

## 2018-01-25 DIAGNOSIS — M25571 Pain in right ankle and joints of right foot: Secondary | ICD-10-CM | POA: Diagnosis not present

## 2018-02-01 ENCOUNTER — Ambulatory Visit (INDEPENDENT_AMBULATORY_CARE_PROVIDER_SITE_OTHER): Payer: Medicare Other

## 2018-02-01 DIAGNOSIS — I4891 Unspecified atrial fibrillation: Secondary | ICD-10-CM

## 2018-02-01 DIAGNOSIS — Z9889 Other specified postprocedural states: Secondary | ICD-10-CM

## 2018-02-01 DIAGNOSIS — Z5181 Encounter for therapeutic drug level monitoring: Secondary | ICD-10-CM

## 2018-02-01 DIAGNOSIS — I482 Chronic atrial fibrillation, unspecified: Secondary | ICD-10-CM

## 2018-02-01 DIAGNOSIS — I059 Rheumatic mitral valve disease, unspecified: Secondary | ICD-10-CM

## 2018-02-01 LAB — POCT INR: INR: 2.7 (ref 2.0–3.0)

## 2018-02-01 NOTE — Patient Instructions (Signed)
Please take 1.5 tablets today then START NEW DOSAGE of 1 tablet every day. Recheck INR in 2 weeks.

## 2018-02-02 DIAGNOSIS — M25571 Pain in right ankle and joints of right foot: Secondary | ICD-10-CM | POA: Diagnosis not present

## 2018-02-10 ENCOUNTER — Other Ambulatory Visit: Payer: Self-pay

## 2018-02-10 DIAGNOSIS — I10 Essential (primary) hypertension: Secondary | ICD-10-CM

## 2018-02-10 MED ORDER — LISINOPRIL 40 MG PO TABS: 40.0000 mg | ORAL_TABLET | Freq: Every day | ORAL | 2 refills | Status: DC

## 2018-02-11 ENCOUNTER — Other Ambulatory Visit: Payer: Self-pay | Admitting: Pharmacist Clinician (PhC)/ Clinical Pharmacy Specialist

## 2018-02-11 MED ORDER — WARFARIN SODIUM 5 MG PO TABS: ORAL_TABLET | ORAL | 1 refills | Status: DC

## 2018-02-15 ENCOUNTER — Ambulatory Visit (INDEPENDENT_AMBULATORY_CARE_PROVIDER_SITE_OTHER): Payer: Medicare Other

## 2018-02-15 ENCOUNTER — Other Ambulatory Visit: Payer: Self-pay | Admitting: Cardiology

## 2018-02-15 DIAGNOSIS — I059 Rheumatic mitral valve disease, unspecified: Secondary | ICD-10-CM

## 2018-02-15 DIAGNOSIS — I482 Chronic atrial fibrillation, unspecified: Secondary | ICD-10-CM

## 2018-02-15 DIAGNOSIS — Z9889 Other specified postprocedural states: Secondary | ICD-10-CM

## 2018-02-15 DIAGNOSIS — J3089 Other allergic rhinitis: Secondary | ICD-10-CM | POA: Diagnosis not present

## 2018-02-15 DIAGNOSIS — I4891 Unspecified atrial fibrillation: Secondary | ICD-10-CM

## 2018-02-15 DIAGNOSIS — Z5181 Encounter for therapeutic drug level monitoring: Secondary | ICD-10-CM

## 2018-02-15 LAB — POCT INR: INR: 3.8 — AB (ref 2.0–3.0)

## 2018-02-15 NOTE — Patient Instructions (Signed)
Please have a large serving of greens today, then resume dosage of 1 tablet every day. Recheck INR in 2 weeks.

## 2018-02-23 DIAGNOSIS — M25571 Pain in right ankle and joints of right foot: Secondary | ICD-10-CM | POA: Diagnosis not present

## 2018-03-01 ENCOUNTER — Ambulatory Visit (INDEPENDENT_AMBULATORY_CARE_PROVIDER_SITE_OTHER): Payer: Medicare Other

## 2018-03-01 DIAGNOSIS — Z9889 Other specified postprocedural states: Secondary | ICD-10-CM

## 2018-03-01 DIAGNOSIS — I059 Rheumatic mitral valve disease, unspecified: Secondary | ICD-10-CM

## 2018-03-01 DIAGNOSIS — Z5181 Encounter for therapeutic drug level monitoring: Secondary | ICD-10-CM | POA: Diagnosis not present

## 2018-03-01 DIAGNOSIS — I482 Chronic atrial fibrillation, unspecified: Secondary | ICD-10-CM

## 2018-03-01 DIAGNOSIS — I4891 Unspecified atrial fibrillation: Secondary | ICD-10-CM | POA: Diagnosis not present

## 2018-03-01 LAB — POCT INR: INR: 5.3 — AB (ref 2.0–3.0)

## 2018-03-01 NOTE — Patient Instructions (Signed)
Please skip coumadin today and tomorrow, then resume dosage of 1 tablet every day. Recheck INR in 2 weeks.

## 2018-03-12 LAB — FECAL OCCULT BLOOD, GUAIAC: Fecal Occult Blood: NEGATIVE

## 2018-03-15 ENCOUNTER — Ambulatory Visit (INDEPENDENT_AMBULATORY_CARE_PROVIDER_SITE_OTHER): Payer: Medicare Other

## 2018-03-15 DIAGNOSIS — I4891 Unspecified atrial fibrillation: Secondary | ICD-10-CM | POA: Diagnosis not present

## 2018-03-15 DIAGNOSIS — Z5181 Encounter for therapeutic drug level monitoring: Secondary | ICD-10-CM

## 2018-03-15 DIAGNOSIS — I482 Chronic atrial fibrillation, unspecified: Secondary | ICD-10-CM

## 2018-03-15 DIAGNOSIS — Z9889 Other specified postprocedural states: Secondary | ICD-10-CM | POA: Diagnosis not present

## 2018-03-15 DIAGNOSIS — I059 Rheumatic mitral valve disease, unspecified: Secondary | ICD-10-CM | POA: Diagnosis not present

## 2018-03-15 LAB — POCT INR: INR: 3.7 — AB (ref 2.0–3.0)

## 2018-03-15 NOTE — Patient Instructions (Signed)
Please skip coumadin today, then resume dosage of 1 tablet every day. Recheck INR in 2 weeks.

## 2018-03-25 ENCOUNTER — Encounter: Payer: Self-pay | Admitting: Family Medicine

## 2018-03-29 ENCOUNTER — Ambulatory Visit (INDEPENDENT_AMBULATORY_CARE_PROVIDER_SITE_OTHER): Payer: Medicare Other

## 2018-03-29 ENCOUNTER — Other Ambulatory Visit: Payer: Self-pay

## 2018-03-29 ENCOUNTER — Other Ambulatory Visit
Admission: RE | Admit: 2018-03-29 | Discharge: 2018-03-29 | Disposition: A | Discharge status: Home / Self Care | Payer: Medicare Other | Source: Ambulatory Visit | Attending: Cardiology | Admitting: Cardiology

## 2018-03-29 DIAGNOSIS — Z9889 Other specified postprocedural states: Secondary | ICD-10-CM | POA: Diagnosis not present

## 2018-03-29 DIAGNOSIS — I4891 Unspecified atrial fibrillation: Secondary | ICD-10-CM | POA: Diagnosis not present

## 2018-03-29 DIAGNOSIS — I482 Chronic atrial fibrillation, unspecified: Secondary | ICD-10-CM

## 2018-03-29 DIAGNOSIS — Z5181 Encounter for therapeutic drug level monitoring: Secondary | ICD-10-CM

## 2018-03-29 DIAGNOSIS — I059 Rheumatic mitral valve disease, unspecified: Secondary | ICD-10-CM

## 2018-03-29 DIAGNOSIS — R791 Abnormal coagulation profile: Secondary | ICD-10-CM

## 2018-03-29 DIAGNOSIS — Z7901 Long term (current) use of anticoagulants: Secondary | ICD-10-CM

## 2018-03-29 LAB — PROTIME-INR
INR: 4.2 — AB (ref <=1.1)
INR: 4.24
Prothrombin Time: 40.5 seconds — ABNORMAL HIGH (ref 11.4–15.2)

## 2018-03-29 LAB — POCT INR: INR: 6.7 — AB (ref 2.0–3.0)

## 2018-03-29 NOTE — Progress Notes (Signed)
Pt

## 2018-03-29 NOTE — Patient Instructions (Addendum)
Please skip coumadin today, then resume previous dosage of 1 tablet every day. Try to be more consistent w/ your greens intake. Recheck in 2 weeks.

## 2018-04-06 DIAGNOSIS — M19171 Post-traumatic osteoarthritis, right ankle and foot: Secondary | ICD-10-CM | POA: Diagnosis not present

## 2018-04-12 ENCOUNTER — Ambulatory Visit (INDEPENDENT_AMBULATORY_CARE_PROVIDER_SITE_OTHER): Payer: Medicare Other

## 2018-04-12 DIAGNOSIS — I4891 Unspecified atrial fibrillation: Secondary | ICD-10-CM

## 2018-04-12 DIAGNOSIS — I059 Rheumatic mitral valve disease, unspecified: Secondary | ICD-10-CM | POA: Diagnosis not present

## 2018-04-12 DIAGNOSIS — Z5181 Encounter for therapeutic drug level monitoring: Secondary | ICD-10-CM

## 2018-04-12 DIAGNOSIS — Z9889 Other specified postprocedural states: Secondary | ICD-10-CM

## 2018-04-12 DIAGNOSIS — I482 Chronic atrial fibrillation, unspecified: Secondary | ICD-10-CM | POA: Diagnosis not present

## 2018-04-12 LAB — POCT INR: INR: 4.7 — AB (ref 2.0–3.0)

## 2018-04-12 NOTE — Patient Instructions (Signed)
Please skip coumadin today & tomorrow, then START NEW DOSAGE of 1 tablet every day, EXCEPT 1/2 TABLET ON [place]. Recheck in 2 weeks.

## 2018-04-26 ENCOUNTER — Ambulatory Visit (INDEPENDENT_AMBULATORY_CARE_PROVIDER_SITE_OTHER): Payer: Medicare Other

## 2018-04-26 DIAGNOSIS — Z5181 Encounter for therapeutic drug level monitoring: Secondary | ICD-10-CM | POA: Diagnosis not present

## 2018-04-26 DIAGNOSIS — I4891 Unspecified atrial fibrillation: Secondary | ICD-10-CM

## 2018-04-26 DIAGNOSIS — I059 Rheumatic mitral valve disease, unspecified: Secondary | ICD-10-CM

## 2018-04-26 DIAGNOSIS — I482 Chronic atrial fibrillation, unspecified: Secondary | ICD-10-CM

## 2018-04-26 DIAGNOSIS — Z9889 Other specified postprocedural states: Secondary | ICD-10-CM

## 2018-04-26 LAB — POCT INR: INR: 2.1 (ref 2.0–3.0)

## 2018-04-26 NOTE — Patient Instructions (Signed)
Please take 1 tablet today, then resume dosage of 1 tablet every day, EXCEPT 1/2 TABLET ON [place]. Please pick a day to have your greens and have them every week on that day. Recheck in 2 weeks.

## 2018-05-06 DIAGNOSIS — M19071 Primary osteoarthritis, right ankle and foot: Secondary | ICD-10-CM | POA: Diagnosis not present

## 2018-05-06 DIAGNOSIS — M79671 Pain in right foot: Secondary | ICD-10-CM | POA: Diagnosis not present

## 2018-05-07 ENCOUNTER — Ambulatory Visit (INDEPENDENT_AMBULATORY_CARE_PROVIDER_SITE_OTHER): Payer: Medicare Other | Admitting: Family Medicine

## 2018-05-07 ENCOUNTER — Encounter: Payer: Self-pay | Admitting: Family Medicine

## 2018-05-07 VITALS — BP 158/80 | HR 68 | Temp 98.1°F | Resp 16 | Ht 64.0 in | Wt 191.0 lb

## 2018-05-07 DIAGNOSIS — H6981 Other specified disorders of Eustachian tube, right ear: Secondary | ICD-10-CM

## 2018-05-07 DIAGNOSIS — H9201 Otalgia, right ear: Secondary | ICD-10-CM

## 2018-05-07 MED ORDER — PREDNISONE 10 MG PO TABS: ORAL_TABLET | ORAL | 0 refills | Status: DC

## 2018-05-07 NOTE — Progress Notes (Signed)
Subjective:    Patient ID: [name], female    DOB: 03-Sep-1955, 62 y.o.   MRN: [id number]  [name] is a 62 y.o. female presenting on 05/07/2018 for Ear Pain (right side onset weeks but notice knot on Right side cheek)  Patient presents for a same day appointment.  HPI   Right Ear Pain / Pressure Reports symptoms started about 1 week, no injury, will have episodes of sharp shooting pain into R ear, and feels pressure and popping at times like "in the mountains" feels like fluid. She has not had recent ear infection or other infection, does admit sinus congestion and allergies - Using Flonase spray 2 each side every day, causing some burning each use with epistaxis at times - Admits sinus congestion and pressure - Admits swelling below ear on face yesterday, with some tender - Denies any hearing loss, fevers, ear drainage  Health Maintenance: UTD Flu vaccine already at pharmacy 03/2018  Depression screen [facility] 2/9 05/07/2018 12/17/2017 05/26/2017  Decreased Interest 0 0 0  Down, Depressed, Hopeless 0 0 0  PHQ - 2 Score 0 0 0  Altered sleeping - - -  Tired, decreased energy - - -  Change in appetite - - -  Feeling bad or failure about yourself  - - -  Trouble concentrating - - -  Moving slowly or fidgety/restless - - -  Suicidal thoughts - - -  PHQ-9 Score - - -  Difficult doing work/chores - - -  Some recent data might be hidden    Social History   Tobacco Use  . Smoking status: Never Smoker  . Smokeless tobacco: Never Used  Substance Use Topics  . Alcohol use: No  . Drug use: No    Review of Systems Per HPI unless specifically indicated above     Objective:    BP (!) 158/80   Pulse 68   Temp 98.1 F (36.7 C)   Resp 16   Ht 5\' 4"  (1.626 m)   Wt 191 lb (86.6 kg)   BMI 32.79 kg/m   Wt Readings from Last 3 Encounters:  05/07/18 191 lb (86.6 kg)  12/17/17 187 lb (84.8 kg)  12/15/17 186 lb (84.4 kg)    Physical Exam  Constitutional: She is  oriented to person, place, and time. She appears well-developed and well-nourished. No distress.  Well-appearing, comfortable, cooperative  HENT:  Head: Normocephalic and atraumatic.  Mouth/Throat: Oropharynx is clear and moist.  Frontal / maxillary sinuses non-tender. Nares with turbinate edema and congestion and some evidence of recent epistaxis with scab bilateral.  Bilateral TMs with R>L mild opaque effusion but mostly still clear without erythema or bulging. - R Ear mild tender over tragus and inferiorly, without obvious LAD, some localized soft tissue swelling inferiorly  Oropharynx clear without erythema, exudates, edema or asymmetry.  Eyes: Conjunctivae are normal. Right eye exhibits no discharge. Left eye exhibits no discharge.  Neck: Normal range of motion. Neck supple.  Cardiovascular: Normal rate.  Pulmonary/Chest: Effort normal.  Musculoskeletal: She exhibits no edema.  Lymphadenopathy:    She has no cervical adenopathy.  Neurological: She is alert and oriented to person, place, and time.  Skin: Skin is warm and dry. No rash noted. She is not diaphoretic. No erythema.  Nursing note and vitals reviewed.  Results for orders placed or performed in visit on 04/26/18  POCT INR  Result Value Ref Range   INR 2.1 2.0 - 3.0  Assessment & Plan:   Problem List Items Addressed This Visit    None    Visit Diagnoses    Acute dysfunction of right eustachian tube    -  Primary   Relevant Medications   predniSONE (DELTASONE) 10 MG tablet   Right ear pain       Relevant Medications   predniSONE (DELTASONE) 10 MG tablet      Acute R eustachian tube dysfunction with secondary R>L ear effusion, without loss of hearing or evidence of AOM Possible rhinosinusitis underlying  Plan: 1. START Prednisone taper 60 to 10mg  over 6 day for reducing swelling / pressure - Not consistent with bacterial infection, defer antibiotics 2. Continue Flonase 2 sprays each nare daily for up to  4-6 weeks or longer 3. Continue OTC oral anti-histamine daily 4. Recommend may consider trial OTC oral decongestant for up to 1 week or less 5. AVOID Afrin nasal decongestant, counseled if uses, max dose up to 3 days or less, avoid rebound rhinitis 6. Follow-up if not improved or worsening, return criteria  May consider sending in antibiotic within next 3-7 days if worsening concern for sinus infection, patient will f/u or call  If still limited improvement may consider refer ENT   Meds ordered this encounter  Medications  . predniSONE (DELTASONE) 10 MG tablet    Sig: Take 6 tabs with breakfast Day 1, 5 tabs Day 2, 4 tabs Day 3, 3 tabs Day 4, 2 tabs Day 5, 1 tab Day 6.    Dispense:  21 tablet    Refill:  0      Follow up plan: Return in about 2 weeks (around 05/21/2018), or if symptoms worsen or fail to improve, for 1-2 weeks sinus ear.   Nobie Putnam, DO [facility] 05/07/2018, 10:23 AM

## 2018-05-07 NOTE — Patient Instructions (Addendum)
Thank you for coming to the office today.  You have some Eustachian Tube Dysfunction, this problem is usually caused by some deeper sinus swelling and pressure, causing difficulty of eustachian tubes to clear fluid from behind ear drum. You can have ear pain, pressure, fullness, loss of hearing. Often related to sinus symptoms and sometimes with sinusitis or infection or allergy symptoms.  Treatment: START Prednisone steroid to reduce swelling and pain.  - KEEP using nasal steroid spray, Flonase 2 sprays in each nostril every day for at least 4-6 weeks, and maybe longer - KEEP OTC allergy medicine (Claritin, Zyrtec, or Allegra - or generics) once daily - For significant ear/sinus pressure, try OTC decongestant such as Phenylephrine or similar medicines, included in DayQuil, take this for up to 1 week or less, no longer - can try Behind the Sudafed - AVOID Afrin nasal spray, if you use this do not use more than 3 days or can make sinus swelling worse  May try Nasal Saline OTC - Simply Saline to flush nasal passages  Look into "Galbreath Technique" for ear effusion, can do this simple massage 1-2 x daily for few days then stop. And use as needed. May also use a warm moist wash cloth to help loosen up this tissue before or after doing this to help open up eustachian tube.  If any significant worsening, loss of hearing, constant pain, fever/chills, or concern for infection - notify office and we can send in an antibiotic   Please schedule a Follow-up Appointment to: Return in about 2 weeks (around 05/21/2018), or if symptoms worsen or fail to improve, for 1-2 weeks sinus ear.  If you have any other questions or concerns, please feel free to call the office or send a message through [place]. You may also schedule an earlier appointment if necessary.  Additionally, you may be receiving a survey about your experience at our office within a few days to 1 week by e-mail or mail. We value your  feedback.  Nobie Putnam, DO [place]

## 2018-05-10 ENCOUNTER — Ambulatory Visit (INDEPENDENT_AMBULATORY_CARE_PROVIDER_SITE_OTHER): Payer: Medicare Other

## 2018-05-10 DIAGNOSIS — I482 Chronic atrial fibrillation, unspecified: Secondary | ICD-10-CM

## 2018-05-10 DIAGNOSIS — I4891 Unspecified atrial fibrillation: Secondary | ICD-10-CM

## 2018-05-10 DIAGNOSIS — Z9889 Other specified postprocedural states: Secondary | ICD-10-CM

## 2018-05-10 DIAGNOSIS — I059 Rheumatic mitral valve disease, unspecified: Secondary | ICD-10-CM

## 2018-05-10 DIAGNOSIS — Z5181 Encounter for therapeutic drug level monitoring: Secondary | ICD-10-CM

## 2018-05-10 LAB — POCT INR: INR: 4.5 — AB (ref 2.0–3.0)

## 2018-05-10 NOTE — Patient Instructions (Signed)
Since you are on prednisone, please skip coumadin today and tomorrow, then resume dosage of 1 tablet every day, EXCEPT 1/2 [place]. Recheck in 2 weeks.

## 2018-05-13 ENCOUNTER — Telehealth: Payer: Self-pay | Admitting: Family Medicine

## 2018-05-13 DIAGNOSIS — J0100 Acute maxillary sinusitis, unspecified: Secondary | ICD-10-CM

## 2018-05-13 MED ORDER — BENZONATATE 100 MG PO CAPS: 100.0000 mg | ORAL_CAPSULE | Freq: Three times a day (TID) | ORAL | 0 refills | Status: DC | PRN

## 2018-05-13 MED ORDER — AMOXICILLIN-POT CLAVULANATE 875-125 MG PO TABS: 1.0000 | ORAL_TABLET | Freq: Two times a day (BID) | ORAL | 0 refills | Status: DC

## 2018-05-13 NOTE — Telephone Encounter (Signed)
Pt is coughing and losing her voice.  She was told if she wasn't better something could be called in for her.  Her number is [phone]

## 2018-05-13 NOTE — Telephone Encounter (Signed)
Called patient, see prior office visit note, now she describes acute sinusitis symptoms, will send augmentin and tessalon perls.  Nobie Putnam, DO [facility] 05/13/2018, 2:26 PM

## 2018-05-24 ENCOUNTER — Ambulatory Visit (INDEPENDENT_AMBULATORY_CARE_PROVIDER_SITE_OTHER): Payer: Medicare Other

## 2018-05-24 DIAGNOSIS — Z9889 Other specified postprocedural states: Secondary | ICD-10-CM | POA: Diagnosis not present

## 2018-05-24 DIAGNOSIS — I4891 Unspecified atrial fibrillation: Secondary | ICD-10-CM | POA: Diagnosis not present

## 2018-05-24 DIAGNOSIS — I482 Chronic atrial fibrillation, unspecified: Secondary | ICD-10-CM | POA: Diagnosis not present

## 2018-05-24 DIAGNOSIS — Z5181 Encounter for therapeutic drug level monitoring: Secondary | ICD-10-CM | POA: Diagnosis not present

## 2018-05-24 DIAGNOSIS — I059 Rheumatic mitral valve disease, unspecified: Secondary | ICD-10-CM | POA: Diagnosis not present

## 2018-05-24 LAB — POCT INR: INR: 2.7 (ref 2.0–3.0)

## 2018-05-24 NOTE — Patient Instructions (Signed)
Please take a whole tablet tonight, then resume dosage of 1 tablet every day, EXCEPT 1/2 TABLET ON [place]. Recheck in 2 weeks.

## 2018-06-01 ENCOUNTER — Encounter: Payer: Medicare Other | Admitting: Family Medicine

## 2018-06-01 ENCOUNTER — Other Ambulatory Visit: Payer: Medicare Other

## 2018-06-01 ENCOUNTER — Ambulatory Visit (INDEPENDENT_AMBULATORY_CARE_PROVIDER_SITE_OTHER): Payer: Medicare Other

## 2018-06-01 VITALS — BP 132/74 | HR 81 | Temp 98.2°F | Ht 64.0 in | Wt 191.2 lb

## 2018-06-01 DIAGNOSIS — E7800 Pure hypercholesterolemia, unspecified: Secondary | ICD-10-CM

## 2018-06-01 DIAGNOSIS — Z0000 Encounter for general adult medical examination without abnormal findings: Secondary | ICD-10-CM

## 2018-06-01 DIAGNOSIS — Z8639 Personal history of other endocrine, nutritional and metabolic disease: Secondary | ICD-10-CM

## 2018-06-01 DIAGNOSIS — I482 Chronic atrial fibrillation, unspecified: Secondary | ICD-10-CM

## 2018-06-01 DIAGNOSIS — R7309 Other abnormal glucose: Secondary | ICD-10-CM

## 2018-06-01 DIAGNOSIS — I10 Essential (primary) hypertension: Secondary | ICD-10-CM | POA: Diagnosis not present

## 2018-06-01 DIAGNOSIS — E559 Vitamin D deficiency, unspecified: Secondary | ICD-10-CM

## 2018-06-01 DIAGNOSIS — Z7901 Long term (current) use of anticoagulants: Secondary | ICD-10-CM

## 2018-06-01 NOTE — Progress Notes (Signed)
Subjective:   [name] is a 62 y.o. female who presents for Medicare Annual (Subsequent) preventive examination.  Review of Systems:  N/A  Cardiac Risk Factors include: advanced age (>32men, >32 women);dyslipidemia;hypertension;obesity (BMI >30kg/m2)     Objective:     Vitals: BP 132/74 (BP Location: Right Arm)   Pulse 81   Temp 98.2 F (36.8 C) (Oral)   Ht 5\' 4"  (1.626 m)   Wt 191 lb 3.2 oz (86.7 kg)   BMI 32.82 kg/m   Body mass index is 32.82 kg/m.  Advanced Directives 06/01/2018 08/17/2017 05/26/2017 01/23/2017 11/03/2016 10/29/2016 04/26/2014  Does Patient Have a Medical Advance Directive? No No Yes No No No No  Does patient want to make changes to medical advance directive? - - Yes (MAU/Ambulatory/Procedural Areas - Information given) - - - -  Would patient like information on creating a medical advance directive? No - Patient declined No - Patient declined - No - Patient declined No - Patient declined - -  Pre-existing out of facility DNR order (yellow form or pink MOST form) - - - - - - -    Tobacco Social History   Tobacco Use  Smoking Status Never Smoker  Smokeless Tobacco Never Used     Counseling given: Not Answered   Clinical Intake:  Pre-visit preparation completed: Yes  Pain : No/denies pain Pain Score: 0-No pain     Nutritional Status: BMI > 30  Obese Nutritional Risks: None Diabetes: No  How often do you need to have someone help you when you read instructions, pamphlets, or other written materials from your doctor or pharmacy?: 1 - Never  Interpreter Needed?: No  Information entered by :: [facility], LPN  Past Medical History:  Diagnosis Date  . Anticoagulated on warfarin   . Arthritis   . Chronic atrial fibrillation    SINCE 1997  . Dysrhythmia   . Goiter    multinodular  . Heart murmur   . History of hyperthyroidism    2003--  PTU [place]  . Hypercholesterolemia   . Hyperlipidemia   . Hyperthyroidism    H/O  . Pulmonary  hypertension, mild ([place])   . Rheumatic heart disease    CARDIOLOGIST-- DR Marcello Moores [facility]  . S/P MVR (mitral valve replacement)    1997  [facility]--  SECONDARY TO SEVERE MV STENOSIS   Past Surgical History:  Procedure Laterality Date  . ANTERIOR CERVICAL DECOMP/DISCECTOMY FUSION  12-21-2001   C5 --  C6  . BREAST BIOPSY Left 11/03/2016   Procedure: BREAST BIOPSY WITH NEEDLE LOCALIZATION;  Surgeon: Robert Bellow, MD;  Location: [facility] ORS;  Service: General;  Laterality: Left;  . BREAST EXCISIONAL BIOPSY Left 11/03/2016   excision of calcs. benign  . CARDIAC CATHETERIZATION  12-28-2006  DR [facility]   NORMAL CORONARY ARTERIES  . CARPAL TUNNEL RELEASE Right 01-20-2007  . COLONOSCOPY  2015  . ERCP  08/15/2011   Procedure: ENDOSCOPIC RETROGRADE CHOLANGIOPANCREATOGRAPHY (ERCP);  Surgeon: Jeryl Columbia, MD;  Location: [facility] ENDOSCOPY;  Service: Endoscopy;  Laterality: N/A;  . ERCP     MULTIPLE--  INCLUDING STENTS, SPHINTEROTOMY'S  STONE REMOVAL   . FEMUR IM NAIL Right 04/26/2014   Procedure: INTRAMEDULLARY (IM) NAIL FEMORAL;  Surgeon: Alta Corning, MD;  Location: [place];  Service: Orthopedics;  Laterality: Right;  . I&D EXTREMITY Right 02/07/2013   Procedure: IRRIGATION AND DEBRIDEMENT OF RIGHT LOWER LEG WITH PLACEMENT OF ACELL AND WOULND VAC;  Surgeon: Theodoro Kos, DO;  Location:  [place];  Service: Plastics;  Laterality: Right;  . LAPAROSCOPIC CHOLECYSTECTOMY  04-08-2002  . MITRAL VALVE REPLACEMENT  1997   [facility] [place]  . ORIF ANKLE FRACTURE Right 11/05/2012   Procedure: OPEN REDUCTION INTERNAL FIXATION (ORIF) ANKLE FRACTURE only operating on right;  Surgeon: Alta Corning, MD;  Location: [place];  Service: Orthopedics;  Laterality: Right;  . ORIF RIGHT ULNAR FX W/ ILIAC CREST BONE GRAFT  10-06-2008  . ORIF WRIST FRACTURE Right 04/26/2014   Procedure: OPEN REDUCTION INTERNAL FIXATION (ORIF) WRIST FRACTURE;  Surgeon: Alta Corning, MD;  Location: [place];  Service: Orthopedics;  Laterality:  Right;  . TRANSTHORACIC ECHOCARDIOGRAM  06-29-2009  dr wall   normal lvsf/ ef 55-60%/  normal bileaflet mechanical mvr/ moderated dilated la/ moderate tr  . WRIST ARTHROSCOPY Right 12-10-2007   debridement and ulnar shortening osteotomy w/ plate   Family History  Problem Relation Age of Onset  . Hypothyroidism Sister   . Gallbladder disease Sister   . Coronary artery disease Unknown   . Diabetes Mother   . Hypertension Mother   . Heart disease Father   . Stroke Father   . Breast cancer Neg Hx    Social History   Socioeconomic History  . Marital status: Divorced    Spouse name: Not on file  . Number of children: 0  . Years of education: 52  . Highest education level: 12th grade  Occupational History  . 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 Financial resource strain: Not very hard  . Food insecurity:    Worry: Never true    Inability: Never true  . Transportation needs:    Medical: No    Non-medical: No  Tobacco Use  . Smoking status: Never Smoker  . Smokeless tobacco: Never Used  Substance and Sexual Activity  . Alcohol use: No  . Drug use: No  . Sexual activity: Not on file  Lifestyle  . Physical activity:    Days per week: 1 day    Minutes per session: 60 min  . Stress: Not at all  Relationships  . Social connections:    Talks on phone: More than three times a week    Gets together: More than three times a week    Attends religious service: More than 4 times per year    Active member of club or organization: Yes    Attends meetings of clubs or organizations: More than 4 times per year    Relationship status: Divorced  Other Topics Concern  . Not on file  Social History Narrative  . Not on file    Outpatient Encounter Medications as of 06/01/2018  Medication Sig  . acyclovir (ZOVIRAX) 400 MG tablet Take 1 tablet (400 mg total) by mouth 3 (three) times daily. For 5 to 10 days or until healed for fever blister  . alendronate  (FOSAMAX) 35 MG tablet Take 1 tablet (35 mg total) by mouth every 7 (Joanna) days. Take with a full glass of water on an empty stomach.  Marland Kitchen amitriptyline (ELAVIL) 50 MG tablet Take 50 mg by mouth at bedtime as needed for sleep.   Marland Kitchen amoxicillin (AMOXIL) 500 MG tablet Take all 4 tablets one hour prior to procedure  . aspirin EC 81 MG tablet Take 1 tablet (81 mg total) by mouth daily.  Marland Kitchen atorvastatin (LIPITOR) 40 MG tablet TAKE 1 TABLET BY MOUTH EVERY EVENING.  . calcium carbonate (OSCAL) 1500 (  600 Ca) MG TABS tablet Take 1 tablet by mouth daily.   . cetirizine (ZYRTEC) 10 MG tablet Take 1 tablet (10 mg total) by mouth daily.  . Cholecalciferol (VITAMIN D3) 2000 units capsule Take 1 capsule (2,000 Units total) by mouth daily.  . cycloSPORINE (RESTASIS) 0.05 % ophthalmic emulsion Place 1 drop into both eyes 2 (two) times daily as needed (dry eyes).  Marland Kitchen dicyclomine (BENTYL) 10 MG capsule Take 1 capsule (10 mg total) by mouth 4 (four) times daily -  before meals and at bedtime. (Patient taking differently: Take 10 mg by mouth at bedtime. )  . fluticasone (FLONASE) 50 MCG/ACT nasal spray Place 2 sprays into both nostrils daily.  . furosemide (LASIX) 20 MG tablet Take 1 tablet (20 mg total) by mouth every other day.  . ibuprofen (ADVIL,MOTRIN) 200 MG tablet Take 200 mg by mouth 2 (two) times daily as needed for headache or moderate pain.  Marland Kitchen lisinopril (PRINIVIL,ZESTRIL) 40 MG tablet Take 1 tablet (40 mg total) by mouth daily.  . metoprolol succinate (TOPROL-XL) 100 MG 24 hr tablet Take 1 tablet (100 mg total) by mouth daily. Take with or immediately following a meal.  . Multiple Vitamins-Minerals (CENTRUM SILVER ADULT 50+ PO) Take 1 tablet by mouth daily.  Marland Kitchen warfarin (COUMADIN) 5 MG tablet Take 1 tablet by mouth daily or as directed by coumadin clinic  . amoxicillin-clavulanate (AUGMENTIN) 875-125 MG tablet Take 1 tablet by mouth 2 (two) times daily. (Patient not taking: Reported on 06/01/2018)  . benzonatate  (TESSALON) 100 MG capsule Take 1 capsule (100 mg total) by mouth 3 (three) times daily as needed for cough. (Patient not taking: Reported on 06/01/2018)  . enoxaparin (LOVENOX) 80 MG/0.8ML injection Inject 0.8 mLs (80 mg total) into the skin every 12 (twelve) hours. (Patient not taking: Reported on 06/01/2018)  . predniSONE (DELTASONE) 10 MG tablet Take 6 tabs with breakfast Day 1, 5 tabs Day 2, 4 tabs Day 3, 3 tabs Day 4, 2 tabs Day 5, 1 tab Day 6. (Patient not taking: Reported on 06/01/2018)   No facility-administered encounter medications on file as of 06/01/2018.     Activities of Daily Living In your present state of health, do you have any difficulty performing the following activities: 06/01/2018  Hearing? N  Vision? N  Comment Wears eye glasses.   Difficulty concentrating or making decisions? N  Walking or climbing stairs? Y  Comment Due to right leg pain.   Dressing or bathing? N  Doing errands, shopping? N  Preparing Food and eating ? N  Using the Toilet? N  In the past six months, have you accidently leaked urine? N  Do you have problems with loss of bowel control? N  Managing your Medications? N  Managing your Finances? N  Housekeeping or managing your Housekeeping? N  Some recent data might be hidden    Patient Care Team: Olin Hauser, DO as PCP - General (Family Medicine) Renato Shin, MD as Attending Physician (Internal Medicine) Bary Castilla, Forest Gleason, MD as Consulting Physician (General Surgery) Lucilla Lame, MD as Consulting Physician (Gastroenterology) Lelon Perla, MD as Consulting Physician (Cardiology)    Assessment:   This is a routine wellness examination for Morrisa.  Exercise Activities and Dietary recommendations Current Exercise Habits: Structured exercise class, Type of exercise: Other - see comments(water aerobics), Time (Minutes): 60, Frequency (Times/Week): 1, Weekly Exercise (Minutes/Week): 60, Intensity: Mild, Exercise limited by:  orthopedic condition(s)  Goals    . DIET - INCREASE  WATER INTAKE     Recommend drinking at least 6-8 glasses of water a day     . Prevent falls     Recommend to remove any items from the home that may cause slips or trips.         Fall Risk Fall Risk  06/01/2018 05/07/2018 12/17/2017 05/26/2017 01/27/2017  Falls in the past year? 1 0 No No No  Number falls in past yr: 1 - - - -  Injury with Fall? 0 - - - -  Follow up Falls prevention discussed Falls evaluation completed - - -   FALL RISK PREVENTION PERTAINING TO THE HOME:  Any stairs in or around the home WITH handrails? No  Home free of loose throw rugs in walkways, pet beds, electrical cords, etc? Yes  Adequate lighting in your home to reduce risk of falls? Yes   ASSISTIVE DEVICES UTILIZED TO PREVENT FALLS:  Life alert? No  Use of a cane, walker or w/c? Yes  Grab bars in the bathroom? Yes  Shower chair or bench in shower? Yes  Elevated toilet seat or a handicapped toilet? Yes    TIMED UP AND GO:  Was the test performed? No .    Depression Screen PHQ 2/9 Scores 06/01/2018 05/07/2018 12/17/2017 05/26/2017  PHQ - 2 Score 0 0 0 0  PHQ- 9 Score - - - -     Cognitive Function     6CIT Screen 06/01/2018 05/26/2017  What Year? 0 points 0 points  What month? 0 points 0 points  What time? 0 points 0 points  Count back from 20 0 points 0 points  Months in reverse 0 points 0 points  Repeat phrase 0 points 2 points  Total Score 0 2    Immunization History  Administered Date(s) Administered  . Influenza-Unspecified 03/17/2016, 03/30/2018  . Zoster 04/29/2016    Qualifies for Shingles Vaccine? Yes  Zostavax completed 04/29/16. Due for Shingrix. Education has been provided regarding the importance of this vaccine. Pt has been advised to call insurance company to determine out of pocket expense. Advised may also receive vaccine at local pharmacy or Health Dept. Verbalized acceptance and understanding.  Tdap: Up to  date  Flu Vaccine: Up to date    Screening Tests Health Maintenance  Topic Date Due  . COLON CANCER SCREENING ANNUAL FOBT  03/13/2019  . PAP SMEAR  06/04/2019  . MAMMOGRAM  11/07/2019  . DEXA SCAN  12/18/2019  . COLONOSCOPY  02/05/2021  . TETANUS/TDAP  04/26/2024  . INFLUENZA VACCINE  Completed  . Hepatitis C Screening  Completed  . HIV Screening  Completed    Cancer Screenings:  Colorectal Screening: Completed 02/06/16. Repeat every 5 years.  Mammogram: Completed 11/06/17. Repeat every year.  Bone Density: Completed 12/17/17. Results reflect OSTEOPOROSIS. Repeat every 2 years.   Lung Cancer Screening: (Low Dose CT Chest recommended if Age 44-80 years, 30 pack-year currently smoking OR have quit w/in 15years.) does not qualify.    Additional Screening:  Hepatitis C Screening: Up to date  Vision Screening: Recommended annual ophthalmology exams for early detection of glaucoma and other disorders of the eye.  Dental Screening: Recommended annual dental exams for proper oral hygiene  Community Resource Referral:  CRR required this visit?  No       Plan:  I have personally reviewed and addressed the Medicare Annual Wellness questionnaire and have noted the following in the patient's chart:  A. Medical and social history B. Use of alcohol,  tobacco or illicit drugs  C. Current medications and supplements D. Functional ability and status E.  Nutritional status F.  Physical activity G. Advance directives H. List of other physicians I.  Hospitalizations, surgeries, and ER visits in previous 12 months J.  [place] such as hearing and vision if needed, cognitive and depression L. Referrals and appointments - none  In addition, I have reviewed and discussed with patient certain preventive protocols, quality metrics, and best practice recommendations. A written personalized care plan for preventive services as well as general preventive health  recommendations were provided to patient.  See attached scanned questionnaire for additional information.   Signed,  Fabio Neighbors, LPN Nurse Health Advisor   Nurse Recommendations: None.

## 2018-06-01 NOTE — Patient Instructions (Addendum)
[name] , Thank you for taking time to come for your Medicare Wellness Visit. I appreciate your ongoing commitment to your health goals. Please review the following plan we discussed and let me know if I can assist you in the future.   Screening recommendations/referrals: Colonoscopy: Up to date, due 01/2026 Mammogram: Up to date, due 10/2018 Bone Density: Up to date, due 11/2019 Recommended yearly ophthalmology/optometry visit for glaucoma screening and checkup Recommended yearly dental visit for hygiene and checkup  Vaccinations: Influenza vaccine: Up to date Pneumococcal vaccine: Not required until age 73  Tdap vaccine: Up to date, due 04/2024 Shingles vaccine: Pt declines today.     Advanced directives: Advance directive discussed with you today. Even though you declined this today please call our office should you change your mind and we can give you the proper paperwork for you to fill out.  Conditions/risks identified: Obesity; Fall risk prevention  Next appointment: 06/08/18 @ 8:00 AM  Preventive Care 40-64 Years, Female Preventive care refers to lifestyle choices and visits with your health care provider that can promote health and wellness. What does preventive care include?  A yearly physical exam. This is also called an annual well check.  Dental exams once or twice a year.  Routine eye exams. Ask your health care provider how often you should have your eyes checked.  Personal lifestyle choices, including:  Daily care of your teeth and gums.  Regular physical activity.  Eating a healthy diet.  Avoiding tobacco and drug use.  Limiting alcohol use.  Practicing safe sex.  Taking low-dose aspirin daily starting at age 80.  Taking vitamin and mineral supplements as recommended by your health care provider. What happens during an annual well check? The services and screenings done by your health care provider during your annual well check will depend on your  age, overall health, lifestyle risk factors, and family history of disease. Counseling  Your health care provider may ask you questions about your:  Alcohol use.  Tobacco use.  Drug use.  Emotional well-being.  Home and relationship well-being.  Sexual activity.  Eating habits.  Work and work Statistician.  Method of birth control.  Menstrual cycle.  Pregnancy history. Screening  You may have the following tests or measurements:  Height, weight, and BMI.  Blood pressure.  Lipid and cholesterol levels. These may be checked every 5 years, or more frequently if you are over 80 years old.  Skin check.  Lung cancer screening. You may have this screening every year starting at age 61 if you have a 30-pack-year history of smoking and currently smoke or have quit within the past 15 years.  Fecal occult blood test (FOBT) of the stool. You may have this test every year starting at age 64.  Flexible sigmoidoscopy or colonoscopy. You may have a sigmoidoscopy every 5 years or a colonoscopy every 10 years starting at age 32.  Hepatitis C blood test.  Hepatitis B blood test.  Sexually transmitted disease (STD) testing.  Diabetes screening. This is done by checking your blood sugar (glucose) after you have not eaten for a while (fasting). You may have this done every 1-3 years.  Mammogram. This may be done every 1-2 years. Talk to your health care provider about when you should start having regular mammograms. This may depend on whether you have a family history of breast cancer.  BRCA-related cancer screening. This may be done if you have a family history of breast, ovarian, tubal, or peritoneal  cancers.  Pelvic exam and Pap test. This may be done every 3 years starting at age 92. Starting at age 69, this may be done every 5 years if you have a Pap test in combination with an HPV test.  Bone density scan. This is done to screen for osteoporosis. You may have this scan if you  are at high risk for osteoporosis. Discuss your test results, treatment options, and if necessary, the need for more tests with your health care provider. Vaccines  Your health care provider may recommend certain vaccines, such as:  Influenza vaccine. This is recommended every year.  Tetanus, diphtheria, and acellular pertussis (Tdap, Td) vaccine. You may need a Td booster every 10 years.  Zoster vaccine. You may need this after age 70.  Pneumococcal 13-valent conjugate (PCV13) vaccine. You may need this if you have certain conditions and were not previously vaccinated.  Pneumococcal polysaccharide (PPSV23) vaccine. You may need one or two doses if you smoke cigarettes or if you have certain conditions. Talk to your health care provider about which screenings and vaccines you need and how often you need them. This information is not intended to replace advice given to you by your health care provider. Make sure you discuss any questions you have with your health care provider. Document Released: 07/06/2015 Document Revised: 02/27/2016 Document Reviewed: 04/10/2015 Elsevier Interactive Patient Education  2017 [place] Prevention in the Home Falls can cause injuries. They can happen to people of all ages. There are many things you can do to make your home safe and to help prevent falls. What can I do on the outside of my home?  Regularly fix the edges of walkways and driveways and fix any cracks.  Remove anything that might make you trip as you walk through a door, such as a raised step or threshold.  Trim any bushes or trees on the path to your home.  Use bright outdoor lighting.  Clear any walking paths of anything that might make someone trip, such as rocks or tools.  Regularly check to see if handrails are loose or broken. Make sure that both sides of any steps have handrails.  Any raised decks and porches should have guardrails on the edges.  Have any leaves,  snow, or ice cleared regularly.  Use sand or salt on walking paths during winter.  Clean up any spills in your garage right away. This includes oil or grease spills. What can I do in the bathroom?  Use night lights.  Install grab bars by the toilet and in the tub and shower. Do not use towel bars as grab bars.  Use non-skid mats or decals in the tub or shower.  If you need to sit down in the shower, use a plastic, non-slip stool.  Keep the floor dry. Clean up any water that spills on the floor as soon as it happens.  Remove soap buildup in the tub or shower regularly.  Attach bath mats securely with double-sided non-slip rug tape.  Do not have throw rugs and other things on the floor that can make you trip. What can I do in the bedroom?  Use night lights.  Make sure that you have a light by your bed that is easy to reach.  Do not use any sheets or blankets that are too big for your bed. They should not hang down onto the floor.  Have a firm chair that has side arms. You can use  this for support while you get dressed.  Do not have throw rugs and other things on the floor that can make you trip. What can I do in the kitchen?  Clean up any spills right away.  Avoid walking on wet floors.  Keep items that you use a lot in easy-to-reach places.  If you need to reach something above you, use a strong step stool that has a grab bar.  Keep electrical cords out of the way.  Do not use floor polish or wax that makes floors slippery. If you must use wax, use non-skid floor wax.  Do not have throw rugs and other things on the floor that can make you trip. What can I do with my stairs?  Do not leave any items on the stairs.  Make sure that there are handrails on both sides of the stairs and use them. Fix handrails that are broken or loose. Make sure that handrails are as long as the stairways.  Check any carpeting to make sure that it is firmly attached to the stairs. Fix any  carpet that is loose or worn.  Avoid having throw rugs at the top or bottom of the stairs. If you do have throw rugs, attach them to the floor with carpet tape.  Make sure that you have a light switch at the top of the stairs and the bottom of the stairs. If you do not have them, ask someone to add them for you. What else can I do to help prevent falls?  Wear shoes that:  Do not have high heels.  Have rubber bottoms.  Are comfortable and fit you well.  Are closed at the toe. Do not wear sandals.  If you use a stepladder:  Make sure that it is fully opened. Do not climb a closed stepladder.  Make sure that both sides of the stepladder are locked into place.  Ask someone to hold it for you, if possible.  Clearly mark and make sure that you can see:  Any grab bars or handrails.  First and last steps.  Where the edge of each step is.  Use tools that help you move around (mobility aids) if they are needed. These include:  Canes.  Walkers.  Scooters.  Crutches.  Turn on the lights when you go into a dark area. Replace any light bulbs as soon as they burn out.  Set up your furniture so you have a clear path. Avoid moving your furniture around.  If any of your floors are uneven, fix them.  If there are any pets around you, be aware of where they are.  Review your medicines with your doctor. Some medicines can make you feel dizzy. This can increase your chance of falling. Ask your doctor what other things that you can do to help prevent falls. This information is not intended to replace advice given to you by your health care provider. Make sure you discuss any questions you have with your health care provider. Document Released: 04/05/2009 Document Revised: 11/15/2015 Document Reviewed: 07/14/2014 Elsevier Interactive Patient Education  2017 [employer].

## 2018-06-02 LAB — CBC WITH DIFFERENTIAL/PLATELET
BASOS PCT: 0.4 %
Basophils Absolute: 19 cells/uL (ref 0–200)
EOS PCT: 1.7 %
Eosinophils Absolute: 80 cells/uL (ref 15–500)
HCT: 38.5 % (ref 35.0–45.0)
HEMOGLOBIN: 12.2 g/dL (ref 11.7–15.5)
Lymphs Abs: 1278 cells/uL (ref 850–3900)
MCH: 27.7 pg (ref 27.0–33.0)
MCHC: 31.7 g/dL — ABNORMAL LOW (ref 32.0–36.0)
MCV: 87.3 fL (ref 80.0–100.0)
MPV: 12.3 fL (ref 7.5–12.5)
Monocytes Relative: 7.7 %
NEUTROS ABS: 2961 {cells}/uL (ref 1500–7800)
Neutrophils Relative %: 63 %
Platelets: 192 10*3/uL (ref 140–400)
RBC: 4.41 10*6/uL (ref 3.80–5.10)
RDW: 14.2 % (ref 11.0–15.0)
Total Lymphocyte: 27.2 %
WBC mixed population: 362 cells/uL (ref 200–950)
WBC: 4.7 10*3/uL (ref 3.8–10.8)

## 2018-06-02 LAB — LIPID PANEL
Cholesterol: 170 mg/dL (ref <200)
HDL: 50 mg/dL — AB (ref >50)
LDL Cholesterol (Calc): 97 mg/dL (calc)
Non-HDL Cholesterol (Calc): 120 mg/dL (calc) (ref <130)
TRIGLYCERIDES: 129 mg/dL (ref <150)
Total CHOL/HDL Ratio: 3.4 (calc) (ref <5.0)

## 2018-06-02 LAB — HEMOGLOBIN A1C
EAG (MMOL/L): 6.5 (calc)
HEMOGLOBIN A1C: 5.7 %{Hb} — AB (ref <5.7)
MEAN PLASMA GLUCOSE: 117 (calc)

## 2018-06-02 LAB — T4, FREE: Free T4: 0.9 ng/dL (ref 0.8–1.8)

## 2018-06-02 LAB — COMPLETE METABOLIC PANEL WITH GFR
AG Ratio: 1.1 (calc) (ref 1.0–2.5)
ALT: 12 U/L (ref 6–29)
AST: 17 U/L (ref 10–35)
Albumin: 3.7 g/dL (ref 3.6–5.1)
Alkaline phosphatase (APISO): 90 U/L (ref 33–130)
BUN: 12 mg/dL (ref 7–25)
CALCIUM: 9 mg/dL (ref 8.6–10.4)
CHLORIDE: 105 mmol/L (ref 98–110)
CO2: 28 mmol/L (ref 20–32)
Creat: 0.87 mg/dL (ref 0.50–0.99)
GFR, Est African American: 82 mL/min/{1.73_m2} (ref >=60)
GFR, Est Non African American: 70 mL/min/{1.73_m2} (ref >=60)
Globulin: 3.4 g/dL (calc) (ref 1.9–3.7)
Glucose, Bld: 99 mg/dL (ref 65–139)
POTASSIUM: 4.3 mmol/L (ref 3.5–5.3)
SODIUM: 140 mmol/L (ref 135–146)
TOTAL PROTEIN: 7.1 g/dL (ref 6.1–8.1)
Total Bilirubin: 0.6 mg/dL (ref 0.2–1.2)

## 2018-06-02 LAB — VITAMIN D 25 HYDROXY (VIT D DEFICIENCY, FRACTURES): Vit D, 25-Hydroxy: 28 ng/mL — ABNORMAL LOW (ref 30–100)

## 2018-06-02 LAB — TSH: TSH: 0.43 m[IU]/L (ref 0.40–4.50)

## 2018-06-08 ENCOUNTER — Ambulatory Visit (INDEPENDENT_AMBULATORY_CARE_PROVIDER_SITE_OTHER): Payer: Medicare Other | Admitting: Family Medicine

## 2018-06-08 ENCOUNTER — Encounter: Payer: Self-pay | Admitting: Family Medicine

## 2018-06-08 VITALS — BP 127/72 | HR 74 | Temp 98.1°F | Resp 16 | Ht 64.0 in | Wt 189.6 lb

## 2018-06-08 DIAGNOSIS — E042 Nontoxic multinodular goiter: Secondary | ICD-10-CM

## 2018-06-08 DIAGNOSIS — I272 Pulmonary hypertension, unspecified: Secondary | ICD-10-CM | POA: Diagnosis not present

## 2018-06-08 DIAGNOSIS — R7309 Other abnormal glucose: Secondary | ICD-10-CM

## 2018-06-08 DIAGNOSIS — J3089 Other allergic rhinitis: Secondary | ICD-10-CM

## 2018-06-08 DIAGNOSIS — I10 Essential (primary) hypertension: Secondary | ICD-10-CM

## 2018-06-08 DIAGNOSIS — E669 Obesity, unspecified: Secondary | ICD-10-CM | POA: Insufficient documentation

## 2018-06-08 DIAGNOSIS — E66811 Obesity, class 1: Secondary | ICD-10-CM

## 2018-06-08 DIAGNOSIS — E559 Vitamin D deficiency, unspecified: Secondary | ICD-10-CM

## 2018-06-08 DIAGNOSIS — Z952 Presence of prosthetic heart valve: Secondary | ICD-10-CM

## 2018-06-08 DIAGNOSIS — I482 Chronic atrial fibrillation, unspecified: Secondary | ICD-10-CM

## 2018-06-08 DIAGNOSIS — Z0000 Encounter for general adult medical examination without abnormal findings: Secondary | ICD-10-CM

## 2018-06-08 DIAGNOSIS — E663 Overweight: Secondary | ICD-10-CM | POA: Insufficient documentation

## 2018-06-08 DIAGNOSIS — M8589 Other specified disorders of bone density and structure, multiple sites: Secondary | ICD-10-CM

## 2018-06-08 NOTE — Assessment & Plan Note (Signed)
Previously improved, now mild low VIt D 28 On Vitamin D3 2,000 iu daily - continue current therapy, re-check 1 year

## 2018-06-08 NOTE — Assessment & Plan Note (Addendum)
Clinically stable Last thyroid labs normal, negative No longer followed by Endocrine in [facility] off levothyroxine Future may return if needed to Endocrinology Dr Fletcher Anon for follow-up

## 2018-06-08 NOTE — Patient Instructions (Addendum)
Thank you for coming to the office today.  Keep up the good work  Recent Labs    12/17/17 0827 06/01/18 0856  HGBA1C 5.6 5.7*   Try to limit additional sweets / carbs. Keep on goal to reduce to unsweet tea and more water.  Stay active  No change to medications.  Monitor BP  DUE for FASTING BLOOD WORK (no food or drink after midnight before the lab appointment, only water or coffee without cream/sugar on the morning of)  SCHEDULE "Lab Only" visit in the morning at the clinic for lab draw in 6 MONTHS   - Make sure Lab Only appointment is at about 1 week before your next appointment, so that results will be available  For Lab Results, once available within 2-3 days of blood draw, you can can log in to MyChart online to view your results and a brief explanation. Also, we can discuss results at next follow-up visit.   Please schedule a Follow-up Appointment to: Return in about 6 months (around 12/08/2018) for 6 month follow-up PreDM A1c, HTN.  If you have any other questions or concerns, please feel free to call the office or send a message through [place]. You may also schedule an earlier appointment if necessary.  Additionally, you may be receiving a survey about your experience at our office within a few days to 1 week by e-mail or mail. We value your feedback.  Nobie Putnam, DO [place]

## 2018-06-08 NOTE — Assessment & Plan Note (Signed)
Stable AFib, without recurrence RVR. On anticoagulation Followed by [facility] on anticoagulation Coumadin clinic Continue rate control Metoprolol 

## 2018-06-08 NOTE — Assessment & Plan Note (Signed)
Stable to mild elevated A1c 5.7 from 5.5 to 5.6  Plan:  1. Not on any therapy currently - remain off 2. Encourage improved lifestyle - low carb, low sugar diet, limit sweet tea intake, reduce portion size, continue improving regular exercise 3. Follow-up 6 months PreDM A1c

## 2018-06-08 NOTE — Assessment & Plan Note (Signed)
Stable history of mild pulm HTN Followed by Cardiology, with MVR complication

## 2018-06-08 NOTE — Assessment & Plan Note (Signed)
Wt down 2-3 lbs in past few months Encourage improve diet and exercise lifestyle

## 2018-06-08 NOTE — Progress Notes (Signed)
Subjective:    Patient ID: [name], female    DOB: [birth year], 62 y.o.   MRN: [id number]  [name] is a 62 y.o. female presenting on 06/08/2018 for Annual Exam   HPI   Here for Annual Physical and Lab Review  Elevated A1c vs Pre-Diabetes / Obesity BMI >32 - No prior history of Diabetes. Recent trend increased A1c from 5.5 to 5.7 now. She  Lifestyle: - Diet: She admits recently not following particular diet, she admits eating more sweets recently with holidays, she will try to reduce this. Drinks half and half tea often, in future plans to go to [place], working on improving water - Exercise: Increased walking and activity, still has some pain within R femur, has hardware prior surgery  Osteopenia Last DEXA 11/2017, [facility], with T score stable in osteopenia range She has remained on prophylaxis dose of Alendronate 35mg  weekly, tolerating well, needs refill Denies fall or fracture  CHRONIC HTN: Reports no new concerns, some recent SBP readings 140s Current Meds - Lisinopril 40mg  daily, Metoprolol XL 100mg  daily   Reports good compliance, took meds today. Tolerating well, w/o complaints.  Allergic Rhinitis Reports overall ear pain and sinus has improved. Now she just has residual nasal congestion and post nasal drainage, seems episodic, not daily. Continues Flonase  Chronic Atrial Fibrillation, s/p Mitral Valve Replacement, chronic anticoagulation Pulmonary HTN - Followed [facility]iology Dr Stanford Breed last seen 10/2017, continues to f/u with [facility] Coumadin Clinic, she had ECHO on 11/12/17 with normal LVEF and mild mod pulm HTN, lasix 20mg  every other day was added. Otherwise remains on anticoagulation with coumadin for s/p MVR, rate control for permanent AFib.  Health Maintenance: UTD Flu vaccine 03/2018 UTD routine Hep C and HIV screen UTD Mammogram 11/06/17 negative UTD Colonoscopy 2017, UTD Colon FIT Testing 03/12/18 negative  Depression screen  [facility] 2/9 06/08/2018 06/01/2018 05/07/2018  Decreased Interest 0 0 0  Down, Depressed, Hopeless 0 0 0  PHQ - 2 Score 0 0 0  Altered sleeping - - -  Tired, decreased energy - - -  Change in appetite - - -  Feeling bad or failure about yourself  - - -  Trouble concentrating - - -  Moving slowly or fidgety/restless - - -  Suicidal thoughts - - -  PHQ-9 Score - - -  Difficult doing work/chores - - -  Some recent data might be hidden    Past Medical History:  Diagnosis Date  . Anticoagulated on warfarin   . Arthritis   . Chronic atrial fibrillation    SINCE 1997  . Dysrhythmia   . Goiter    multinodular  . Heart murmur   . History of hyperthyroidism    2003--  PTU [place]  . Hypercholesterolemia   . Hyperlipidemia   . Hyperthyroidism    H/O  . Pulmonary hypertension, mild ([place])   . Rheumatic heart disease    CARDIOLOGIST-- DR Marcello Moores [place]  . S/P MVR (mitral valve replacement)    1997  [facility]--  SECONDARY TO SEVERE MV STENOSIS   Past Surgical History:  Procedure Laterality Date  . ANTERIOR CERVICAL DECOMP/DISCECTOMY FUSION  12-21-2001   C5 --  C6  . BREAST BIOPSY Left 11/03/2016   Procedure: BREAST BIOPSY WITH NEEDLE LOCALIZATION;  Surgeon: Robert Bellow, MD;  Location: [facility] ORS;  Service: General;  Laterality: Left;  . BREAST EXCISIONAL BIOPSY Left 11/03/2016   excision of calcs. benign  . CARDIAC CATHETERIZATION  12-28-2006  DR [place]   NORMAL CORONARY ARTERIES  . CARPAL TUNNEL RELEASE Right 01-20-2007  . COLONOSCOPY  2015  . ERCP  08/15/2011   Procedure: ENDOSCOPIC RETROGRADE CHOLANGIOPANCREATOGRAPHY (ERCP);  Surgeon: Jeryl Columbia, MD;  Location: [facility] ENDOSCOPY;  Service: Endoscopy;  Laterality: N/A;  . ERCP     MULTIPLE--  INCLUDING STENTS, SPHINTEROTOMY'S  STONE REMOVAL   . FEMUR IM NAIL Right 04/26/2014   Procedure: INTRAMEDULLARY (IM) NAIL FEMORAL;  Surgeon: Alta Corning, MD;  Location: [place];  Service: Orthopedics;  Laterality: Right;  . I&D EXTREMITY Right  02/07/2013   Procedure: IRRIGATION AND DEBRIDEMENT OF RIGHT LOWER LEG WITH PLACEMENT OF ACELL AND WOULND VAC;  Surgeon: Theodoro Kos, DO;  Location: [place];  Service: Plastics;  Laterality: Right;  . LAPAROSCOPIC CHOLECYSTECTOMY  04-08-2002  . MITRAL VALVE REPLACEMENT  1997   [facility]  . ORIF ANKLE FRACTURE Right 11/05/2012   Procedure: OPEN REDUCTION INTERNAL FIXATION (ORIF) ANKLE FRACTURE only operating on right;  Surgeon: Alta Corning, MD;  Location: [place];  Service: Orthopedics;  Laterality: Right;  . ORIF RIGHT ULNAR FX W/ ILIAC CREST BONE GRAFT  10-06-2008  . ORIF WRIST FRACTURE Right 04/26/2014   Procedure: OPEN REDUCTION INTERNAL FIXATION (ORIF) WRIST FRACTURE;  Surgeon: Alta Corning, MD;  Location: [place];  Service: Orthopedics;  Laterality: Right;  . TRANSTHORACIC ECHOCARDIOGRAM  06-29-2009  dr wall   normal lvsf/ ef 55-60%/  normal bileaflet mechanical mvr/ moderated dilated la/ moderate tr  . WRIST ARTHROSCOPY Right 12-10-2007   debridement and ulnar shortening osteotomy w/ plate   Social History   Socioeconomic History  . Marital status: Divorced    Spouse name: Not on file  . Number of children: 0  . Years of education: 30  . Highest education level: 12th grade  Occupational History  . 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 Financial resource strain: Not very hard  . Food insecurity:    Worry: Never true    Inability: Never true  . Transportation needs:    Medical: No    Non-medical: No  Tobacco Use  . Smoking status: Never Smoker  . Smokeless tobacco: Never Used  Substance and Sexual Activity  . Alcohol use: No  . Drug use: No  . Sexual activity: Not on file  Lifestyle  . Physical activity:    Days per week: 1 day    Minutes per session: 60 min  . Stress: Not at all  Relationships  . Social connections:    Talks on phone: More than three times a week    Gets together: More than three times a week    Attends  religious service: More than 4 times per year    Active member of club or organization: Yes    Attends meetings of clubs or organizations: More than 4 times per year    Relationship status: Divorced  . Intimate partner violence:    Fear of current or ex partner: No    Emotionally abused: No    Physically abused: No    Forced sexual activity: No  Other Topics Concern  . Not on file  Social History Narrative  . Not on file   Family History  Problem Relation Age of Onset  . Hypothyroidism Sister   . Gallbladder disease Sister   . Coronary artery disease Unknown   . Diabetes Mother   . Hypertension Mother   .  Heart disease Father   . Stroke Father   . Breast cancer Neg Hx    Current Outpatient Medications on File Prior to Visit  Medication Sig  . acyclovir (ZOVIRAX) 400 MG tablet Take 1 tablet (400 mg total) by mouth 3 (three) times daily. For 5 to 10 days or until healed for fever blister  . alendronate (FOSAMAX) 35 MG tablet Take 1 tablet (35 mg total) by mouth every 7 (seven) days. Take with a full glass of water on an empty stomach.  Marland Kitchen amitriptyline (ELAVIL) 50 MG tablet Take 50 mg by mouth at bedtime as needed for sleep.   Marland Kitchen amoxicillin (AMOXIL) 500 MG tablet Take all 4 tablets one hour prior to procedure  . aspirin EC 81 MG tablet Take 1 tablet (81 mg total) by mouth daily.  Marland Kitchen atorvastatin (LIPITOR) 40 MG tablet TAKE 1 TABLET BY MOUTH EVERY EVENING.  . calcium carbonate (OSCAL) 1500 (600 Ca) MG TABS tablet Take 1 tablet by mouth daily.   . cetirizine (ZYRTEC) 10 MG tablet Take 1 tablet (10 mg total) by mouth daily.  . Cholecalciferol (VITAMIN D3) 2000 units capsule Take 1 capsule (2,000 Units total) by mouth daily.  . cycloSPORINE (RESTASIS) 0.05 % ophthalmic emulsion Place 1 drop into both eyes 2 (two) times daily as needed (dry eyes).  Marland Kitchen dicyclomine (BENTYL) 10 MG capsule Take 1 capsule (10 mg total) by mouth 4 (four) times daily -  before meals and at bedtime. (Patient  taking differently: Take 10 mg by mouth at bedtime. )  . fluticasone (FLONASE) 50 MCG/ACT nasal spray Place 2 sprays into both nostrils daily.  . furosemide (LASIX) 20 MG tablet Take 1 tablet (20 mg total) by mouth every other day.  . ibuprofen (ADVIL,MOTRIN) 200 MG tablet Take 200 mg by mouth 2 (two) times daily as needed for headache or moderate pain.  Marland Kitchen lisinopril (PRINIVIL,ZESTRIL) 40 MG tablet Take 1 tablet (40 mg total) by mouth daily.  . metoprolol succinate (TOPROL-XL) 100 MG 24 hr tablet Take 1 tablet (100 mg total) by mouth daily. Take with or immediately following a meal.  . Multiple Vitamins-Minerals (CENTRUM SILVER ADULT 50+ PO) Take 1 tablet by mouth daily.  Marland Kitchen warfarin (COUMADIN) 5 MG tablet Take 1 tablet by mouth daily or as directed by coumadin clinic   No current facility-administered medications on file prior to visit.     Review of Systems  Constitutional: Negative for activity change, appetite change, chills, diaphoresis, fatigue and fever.  HENT: Positive for postnasal drip. Negative for congestion and hearing loss.   Eyes: Negative for visual disturbance.  Respiratory: Negative for apnea, cough, choking, chest tightness, shortness of breath and wheezing.   Cardiovascular: Negative for chest pain, palpitations and leg swelling.  Gastrointestinal: Negative for abdominal pain, anal bleeding, blood in stool, constipation, diarrhea, nausea and vomiting.  Endocrine: Negative for cold intolerance.  Genitourinary: Negative for decreased urine volume, difficulty urinating, dysuria, frequency and hematuria.  Musculoskeletal: Negative for arthralgias, back pain and neck pain.  Skin: Negative for rash.  Allergic/Immunologic: Positive for environmental allergies.  Neurological: Negative for dizziness, weakness, light-headedness, numbness and headaches.  Hematological: Negative for adenopathy.  Psychiatric/Behavioral: Negative for behavioral problems, dysphoric mood and sleep  disturbance. The patient is not nervous/anxious.    Per HPI unless specifically indicated above     Objective:    BP 127/72 (BP Location: Left Arm, Cuff Size: Normal)   Pulse 74   Temp 98.1 F (36.7 C) (Oral)  Resp 16   Ht 5\' 4"  (1.626 m)   Wt 189 lb 9.6 oz (86 kg)   BMI 32.54 kg/m   Wt Readings from Last 3 Encounters:  06/08/18 189 lb 9.6 oz (86 kg)  06/01/18 191 lb 3.2 oz (86.7 kg)  05/07/18 191 lb (86.6 kg)    Physical Exam Vitals signs and nursing note reviewed.  Constitutional:      General: She is not in acute distress.    Appearance: She is well-developed. She is not diaphoretic.     Comments: Well-appearing, comfortable, cooperative  HENT:     Head: Normocephalic and atraumatic.  Eyes:     General:        Right eye: No discharge.        Left eye: No discharge.     Conjunctiva/sclera: Conjunctivae normal.     Pupils: Pupils are equal, round, and reactive to light.  Neck:     Musculoskeletal: Normal range of motion and neck supple.     Thyroid: No thyromegaly.  Cardiovascular:     Rate and Rhythm: Normal rate and regular rhythm.     Heart sounds: Normal heart sounds. No murmur.     Comments: Irregularly irregular rhythm. Mechanical heart valve clicking  Pulmonary:     Effort: Pulmonary effort is normal. No respiratory distress.     Breath sounds: Normal breath sounds. No wheezing or rales.  Abdominal:     General: Bowel sounds are normal. There is no distension.     Palpations: Abdomen is soft. There is no mass.     Tenderness: There is no abdominal tenderness.  Musculoskeletal: Normal range of motion.        General: No tenderness.     Comments: Upper / Lower Extremities: - Normal muscle tone, strength bilateral upper extremities 5/5, lower extremities 5/5  Lymphadenopathy:     Cervical: No cervical adenopathy.  Skin:    General: Skin is warm and dry.     Findings: No erythema or rash.  Neurological:     Mental Status: She is alert and oriented to  person, place, and time.     Comments: Distal sensation intact to light touch all extremities  Psychiatric:        Behavior: Behavior normal.     Comments: Well groomed, good eye contact, normal speech and thoughts     Recent Labs    12/17/17 0827 06/01/18 0856  HGBA1C 5.6 5.7*     Results for orders placed or performed in visit on 06/01/18  CBC with Differential/Platelet  Result Value Ref Range   WBC 4.7 3.8 - 10.8 Thousand/uL   RBC 4.41 3.80 - 5.10 Million/uL   Hemoglobin 12.2 11.7 - 15.5 g/dL   HCT 38.5 35.0 - 45.0 %   MCV 87.3 80.0 - 100.0 fL   MCH 27.7 27.0 - 33.0 pg   MCHC 31.7 (L) 32.0 - 36.0 g/dL   RDW 14.2 11.0 - 15.0 %   Platelets 192 140 - 400 Thousand/uL   MPV 12.3 7.5 - 12.5 fL   Neutro Abs 2,961 1,500 - 7,800 cells/uL   Lymphs Abs 1,278 850 - 3,900 cells/uL   WBC mixed population 362 200 - 950 cells/uL   Eosinophils Absolute 80 15 - 500 cells/uL   Basophils Absolute 19 0 - 200 cells/uL   Neutrophils Relative % 63 %   Total Lymphocyte 27.2 %   Monocytes Relative 7.7 %   Eosinophils Relative 1.7 %   Basophils Relative 0.4 %  COMPLETE METABOLIC PANEL WITH GFR  Result Value Ref Range   Glucose, Bld 99 65 - 139 mg/dL   BUN 12 7 - 25 mg/dL   Creat 0.87 0.50 - 0.99 mg/dL   GFR, Est Non African American 70 > OR = 60 mL/min/1.73m2   GFR, Est African American 82 > OR = 60 mL/min/1.35m2   BUN/Creatinine Ratio NOT APPLICABLE 6 - 22 (calc)   Sodium 140 135 - 146 mmol/L   Potassium 4.3 3.5 - 5.3 mmol/L   Chloride 105 98 - 110 mmol/L   CO2 28 20 - 32 mmol/L   Calcium 9.0 8.6 - 10.4 mg/dL   Total Protein 7.1 6.1 - 8.1 g/dL   Albumin 3.7 3.6 - 5.1 g/dL   Globulin 3.4 1.9 - 3.7 g/dL (calc)   AG Ratio 1.1 1.0 - 2.5 (calc)   Total Bilirubin 0.6 0.2 - 1.2 mg/dL   Alkaline phosphatase (APISO) 90 33 - 130 U/L   AST 17 10 - 35 U/L   ALT 12 6 - 29 U/L  Hemoglobin A1c  Result Value Ref Range   Hgb A1c MFr Bld 5.7 (H) <5.7 % of total Hgb   Mean Plasma Glucose 117  (calc)   eAG (mmol/L) 6.5 (calc)  Lipid panel  Result Value Ref Range   Cholesterol 170 <200 mg/dL   HDL 50 (L) >50 mg/dL   Triglycerides 129 <150 mg/dL   LDL Cholesterol (Calc) 97 mg/dL (calc)   Total CHOL/HDL Ratio 3.4 <5.0 (calc)   Non-HDL Cholesterol (Calc) 120 <130 mg/dL (calc)  T4, free  Result Value Ref Range   Free T4 0.9 0.8 - 1.8 ng/dL  TSH  Result Value Ref Range   TSH 0.43 0.40 - 4.50 mIU/L  VITAMIN D 25 Hydroxy (Vit-D Deficiency, Fractures)  Result Value Ref Range   Vit D, 25-Hydroxy 28 (L) 30 - 100 ng/mL   Recent Labs    12/17/17 0827 06/01/18 0856  HGBA1C 5.6 5.7*       Assessment & Plan:   Problem List Items Addressed This Visit    Allergic rhinitis    Persistent problem Improved on Flonase Without complication or sign of secondary infection      Chronic atrial fibrillation    Stable AFib, without recurrence RVR. On anticoagulation Follow[facility] by [facility] Cardiology on anticoagulation Coumadin clinic Continue rate control Metoprolol      Elevated hemoglobin A1c    Stable to mild elevated A1c 5.7 from 5.5 to 5.6  Plan:  1. Not on any therapy currently - remain off 2. Encourage improved lifestyle - low carb, low sugar diet, limit sweet tea intake, reduce portion size, continue improving regular exercise 3. Follow-up 6 months PreDM A1c      Essential hypertension    Controlled HTN. Mild elevated readings at first, repeat is normal - Home BP readings stable  Complicated by chronic Atrial Fibrillation, MVR, Pulm HTN    Plan:  1. Continue current BP regimen Lisinopril 40mg  daily, Metoprolol XL 100mg  daily 2. Encourage improved lifestyle - low sodium diet, regular exercise 3. Continue monitor BP outside office, bring readings to next visit, if persistently >140/90 or new symptoms notify office sooner 4. Follow-up q 6 months      GOITER, MULTINODULAR    Clinically stable Last thyroid labs normal, negative No longer followed by Endocrine in  [place] off levothyroxine Future may return if needed to Endocrinology Dr Fletcher Anon for follow-up      Obesity (BMI 30.0-34.9)  Wt down 2-3 lbs in past few months Encourage improve diet and exercise lifestyle      Osteopenia    Stable osteopenia without complication or new fracture Last DEXA 11/2017 - stable [facility] imaging - On calcium supplement and vitamin D therapy (low vit D as well) - Concern high risk with prior hip and ankle fractures in past  Plan: Continue Alendronate bisphosphonate 35mg  weekly (started 05/2016) Continue Vitamin D3 2,000 iu daily maintenance Repeat DEXA q 2 yr      Pulmonary hypertension ([place])    Stable history of mild pulm HTN Followed by Cardiology, with MVR complication      S/P MVR (mitral valve replacement)    Stable Followed by [facility]iology On chronic anticoagulation, Coumadin clinic      Vitamin D deficiency    Previously improved, now mild low VIt D 28 On Vitamin D3 2,000 iu daily - continue current therapy, re-check 1 year       Other Visit Diagnoses    Annual physical exam    -  Primary      Updated Health Maintenance information Reviewed recent lab results with patient Encouraged improvement to lifestyle with diet and exercise - Goal of weight loss   No orders of the defined types were placed in this encounter.   Follow up plan: Return in about 6 months (around 12/08/2018) for 6 month follow-up PreDM A1c, HTN.  Nobie Putnam, DO [facility] 06/08/2018, 8:11 AM

## 2018-06-08 NOTE — Assessment & Plan Note (Signed)
Stable osteopenia without complication or new fracture Last DEXA 11/2017 - stable [facility] imaging - On calcium supplement and vitamin D therapy (low vit D as well) - Concern high risk with prior hip and ankle fractures in past  Plan: Continue Alendronate bisphosphonate 35mg  weekly (started 05/2016) Continue Vitamin D3 2,000 iu daily maintenance Repeat DEXA q 2 yr

## 2018-06-08 NOTE — Assessment & Plan Note (Addendum)
Stable Followed by [facility] On chronic anticoagulation, Coumadin clinic 

## 2018-06-08 NOTE — Assessment & Plan Note (Signed)
Controlled HTN. Mild elevated readings at first, repeat is normal - Home BP readings stable  Complicated by chronic Atrial Fibrillation, MVR, Pulm HTN    Plan:  1. Continue current BP regimen Lisinopril 40mg  daily, Metoprolol XL 100mg  daily 2. Encourage improved lifestyle - low sodium diet, regular exercise 3. Continue monitor BP outside office, bring readings to next visit, if persistently >140/90 or new symptoms notify office sooner 4. Follow-up q 6 months

## 2018-06-08 NOTE — Assessment & Plan Note (Signed)
Persistent problem Improved on Flonase Without complication or sign of secondary infection

## 2018-06-09 ENCOUNTER — Ambulatory Visit (INDEPENDENT_AMBULATORY_CARE_PROVIDER_SITE_OTHER): Payer: Medicare Other

## 2018-06-09 DIAGNOSIS — Z5181 Encounter for therapeutic drug level monitoring: Secondary | ICD-10-CM

## 2018-06-09 DIAGNOSIS — I482 Chronic atrial fibrillation, unspecified: Secondary | ICD-10-CM | POA: Diagnosis not present

## 2018-06-09 DIAGNOSIS — I059 Rheumatic mitral valve disease, unspecified: Secondary | ICD-10-CM | POA: Diagnosis not present

## 2018-06-09 DIAGNOSIS — I4891 Unspecified atrial fibrillation: Secondary | ICD-10-CM | POA: Diagnosis not present

## 2018-06-09 DIAGNOSIS — Z9889 Other specified postprocedural states: Secondary | ICD-10-CM | POA: Diagnosis not present

## 2018-06-09 LAB — POCT INR: INR: 3 (ref 2.0–3.0)

## 2018-06-09 NOTE — Patient Instructions (Signed)
Please resume dosage of 1 tablet every day, EXCEPT 1/2 TABLET ON [place]. Recheck in 3 weeks.

## 2018-06-18 ENCOUNTER — Encounter: Payer: Medicare Other | Admitting: Family Medicine

## 2018-06-30 ENCOUNTER — Ambulatory Visit (INDEPENDENT_AMBULATORY_CARE_PROVIDER_SITE_OTHER): Payer: Medicare Other

## 2018-06-30 DIAGNOSIS — I482 Chronic atrial fibrillation, unspecified: Secondary | ICD-10-CM | POA: Diagnosis not present

## 2018-06-30 DIAGNOSIS — Z9889 Other specified postprocedural states: Secondary | ICD-10-CM | POA: Diagnosis not present

## 2018-06-30 DIAGNOSIS — I4891 Unspecified atrial fibrillation: Secondary | ICD-10-CM | POA: Diagnosis not present

## 2018-06-30 DIAGNOSIS — I059 Rheumatic mitral valve disease, unspecified: Secondary | ICD-10-CM | POA: Diagnosis not present

## 2018-06-30 DIAGNOSIS — Z5181 Encounter for therapeutic drug level monitoring: Secondary | ICD-10-CM

## 2018-06-30 LAB — POCT INR: INR: 2.9 (ref 2.0–3.0)

## 2018-06-30 NOTE — Patient Instructions (Signed)
Please START NEW DOSAGE of 1 tablet every day, EXCEPT 1/2 TABLET ON MONDAYS. Recheck in 3 weeks.

## 2018-07-21 ENCOUNTER — Ambulatory Visit (INDEPENDENT_AMBULATORY_CARE_PROVIDER_SITE_OTHER): Payer: Medicare Other

## 2018-07-21 DIAGNOSIS — Z5181 Encounter for therapeutic drug level monitoring: Secondary | ICD-10-CM | POA: Diagnosis not present

## 2018-07-21 DIAGNOSIS — I4891 Unspecified atrial fibrillation: Secondary | ICD-10-CM | POA: Diagnosis not present

## 2018-07-21 DIAGNOSIS — I482 Chronic atrial fibrillation, unspecified: Secondary | ICD-10-CM | POA: Diagnosis not present

## 2018-07-21 DIAGNOSIS — I059 Rheumatic mitral valve disease, unspecified: Secondary | ICD-10-CM

## 2018-07-21 DIAGNOSIS — Z9889 Other specified postprocedural states: Secondary | ICD-10-CM

## 2018-07-21 LAB — POCT INR: INR: 3.1 — AB (ref 2.0–3.0)

## 2018-07-21 NOTE — Patient Instructions (Signed)
Please continue dosage of 1 tablet every day, EXCEPT 1/2 TABLET ON MONDAYS. Recheck in 4 weeks.

## 2018-08-16 ENCOUNTER — Ambulatory Visit (INDEPENDENT_AMBULATORY_CARE_PROVIDER_SITE_OTHER): Payer: Medicare Other

## 2018-08-16 DIAGNOSIS — Z9889 Other specified postprocedural states: Secondary | ICD-10-CM | POA: Diagnosis not present

## 2018-08-16 DIAGNOSIS — I059 Rheumatic mitral valve disease, unspecified: Secondary | ICD-10-CM | POA: Diagnosis not present

## 2018-08-16 DIAGNOSIS — I482 Chronic atrial fibrillation, unspecified: Secondary | ICD-10-CM

## 2018-08-16 DIAGNOSIS — I4891 Unspecified atrial fibrillation: Secondary | ICD-10-CM | POA: Diagnosis not present

## 2018-08-16 DIAGNOSIS — Z5181 Encounter for therapeutic drug level monitoring: Secondary | ICD-10-CM | POA: Diagnosis not present

## 2018-08-16 LAB — POCT INR: INR: 4.7 — AB (ref 2.0–3.0)

## 2018-08-16 NOTE — Patient Instructions (Signed)
Please skip coumadin tonight, take 1/2 tablet tomorrow, then continue dosage of 1 tablet every day, EXCEPT 1/2 TABLET ON MONDAYS. Recheck in 4 weeks.

## 2018-08-17 ENCOUNTER — Other Ambulatory Visit: Payer: Self-pay | Admitting: Cardiology

## 2018-08-17 ENCOUNTER — Other Ambulatory Visit: Payer: Self-pay | Admitting: Gastroenterology

## 2018-08-17 ENCOUNTER — Other Ambulatory Visit: Payer: Self-pay

## 2018-08-17 DIAGNOSIS — I10 Essential (primary) hypertension: Secondary | ICD-10-CM

## 2018-08-18 MED ORDER — WARFARIN SODIUM 5 MG PO TABS: ORAL_TABLET | ORAL | 1 refills | Status: DC

## 2018-09-12 ENCOUNTER — Telehealth: Payer: Self-pay | Admitting: Pharmacist

## 2018-09-12 NOTE — Telephone Encounter (Signed)

## 2018-09-13 ENCOUNTER — Other Ambulatory Visit: Payer: Self-pay

## 2018-09-13 ENCOUNTER — Ambulatory Visit (INDEPENDENT_AMBULATORY_CARE_PROVIDER_SITE_OTHER): Payer: Medicare Other

## 2018-09-13 DIAGNOSIS — Z5181 Encounter for therapeutic drug level monitoring: Secondary | ICD-10-CM | POA: Diagnosis not present

## 2018-09-13 DIAGNOSIS — I4891 Unspecified atrial fibrillation: Secondary | ICD-10-CM | POA: Diagnosis not present

## 2018-09-13 DIAGNOSIS — I059 Rheumatic mitral valve disease, unspecified: Secondary | ICD-10-CM | POA: Diagnosis not present

## 2018-09-13 DIAGNOSIS — Z9889 Other specified postprocedural states: Secondary | ICD-10-CM | POA: Diagnosis not present

## 2018-09-13 DIAGNOSIS — I482 Chronic atrial fibrillation, unspecified: Secondary | ICD-10-CM

## 2018-09-13 LAB — POCT INR: INR: 6.2 — AB (ref 2.0–3.0)

## 2018-09-13 NOTE — Patient Instructions (Signed)
Please skip coumadin tonight & tomorrow, then START NEW DOSAGE of 1 tablet every day, EXCEPT 1/2 TABLET ON MONDAYS, [place]. Recheck in 4 weeks.

## 2018-10-08 ENCOUNTER — Telehealth: Payer: Self-pay

## 2018-10-08 NOTE — Telephone Encounter (Signed)
1. Do you currently have a fever? No 2. Have you recently travelled on a cruise, internationally, or to [place], [place], [place], [place], [place], or [place], [place] ([place]) ? No 3. Have you been in contact with someone that is currently pending confirmation of Covid19 testing or has been confirmed to have the Covid19 virus?  No Are you currently experiencing fatigue or cough?No  Spoke w/ pt. Advised that we are restricting visitors at this time and anyone present in the vehicle should meet the above criteria as well. Advised that visit will be at curbside for finger stick ONLY and will receive call with instructions. Pt also advised to please bring own pen for signature of arrival document.   

## 2018-10-11 ENCOUNTER — Ambulatory Visit (INDEPENDENT_AMBULATORY_CARE_PROVIDER_SITE_OTHER): Payer: Medicare Other

## 2018-10-11 ENCOUNTER — Other Ambulatory Visit: Payer: Self-pay

## 2018-10-11 DIAGNOSIS — I059 Rheumatic mitral valve disease, unspecified: Secondary | ICD-10-CM

## 2018-10-11 DIAGNOSIS — I482 Chronic atrial fibrillation, unspecified: Secondary | ICD-10-CM | POA: Diagnosis not present

## 2018-10-11 DIAGNOSIS — Z5181 Encounter for therapeutic drug level monitoring: Secondary | ICD-10-CM | POA: Diagnosis not present

## 2018-10-11 DIAGNOSIS — I4891 Unspecified atrial fibrillation: Secondary | ICD-10-CM

## 2018-10-11 DIAGNOSIS — Z9889 Other specified postprocedural states: Secondary | ICD-10-CM | POA: Diagnosis not present

## 2018-10-11 DIAGNOSIS — R791 Abnormal coagulation profile: Secondary | ICD-10-CM

## 2018-10-11 LAB — POCT INR: INR: 3.9 — AB (ref 2.0–3.0)

## 2018-10-11 NOTE — Patient Instructions (Signed)
Please skip coumadin tonight, then resume dosage of 1 tablet every day, EXCEPT 1/2 TABLET ON MONDAYS, [place]. Recheck in 4 weeks.

## 2018-10-20 ENCOUNTER — Telehealth: Payer: Self-pay | Admitting: Cardiology

## 2018-10-20 NOTE — Telephone Encounter (Signed)
Spoke with pt, Follow up rescheduled

## 2018-10-20 NOTE — Telephone Encounter (Signed)
Spoke with patient and she says that she isn't comfortable with trying a video visit. Advised patient that she would be given clear instructions for a virtual visit if she is given one. Patient advised that due to the pandemic, we are doing VV at this time. Patient says that she is not having any cardiac problems at this time and wishes to wait to be seen in the office.

## 2018-10-20 NOTE — Telephone Encounter (Signed)
New Message    Pt is calling and would like to keep her appt an office visit appt    Please call

## 2018-11-05 ENCOUNTER — Telehealth: Payer: Self-pay

## 2018-11-05 NOTE — Telephone Encounter (Signed)

## 2018-11-08 ENCOUNTER — Other Ambulatory Visit: Payer: Self-pay

## 2018-11-08 ENCOUNTER — Ambulatory Visit (INDEPENDENT_AMBULATORY_CARE_PROVIDER_SITE_OTHER): Payer: Medicare Other

## 2018-11-08 DIAGNOSIS — I482 Chronic atrial fibrillation, unspecified: Secondary | ICD-10-CM

## 2018-11-08 DIAGNOSIS — Z9889 Other specified postprocedural states: Secondary | ICD-10-CM | POA: Diagnosis not present

## 2018-11-08 DIAGNOSIS — I059 Rheumatic mitral valve disease, unspecified: Secondary | ICD-10-CM | POA: Diagnosis not present

## 2018-11-08 DIAGNOSIS — Z5181 Encounter for therapeutic drug level monitoring: Secondary | ICD-10-CM

## 2018-11-08 DIAGNOSIS — I4891 Unspecified atrial fibrillation: Secondary | ICD-10-CM | POA: Diagnosis not present

## 2018-11-08 LAB — POCT INR: INR: 3.2 — AB (ref 2.0–3.0)

## 2018-11-08 NOTE — Patient Instructions (Signed)
Please continue dosage of 1 tablet every day, EXCEPT 1/2 TABLET ON MONDAYS, [place]. Recheck in 5 weeks.

## 2018-11-11 ENCOUNTER — Ambulatory Visit: Payer: Medicare Other | Admitting: Cardiology

## 2018-11-12 ENCOUNTER — Other Ambulatory Visit: Payer: Self-pay | Admitting: Cardiology

## 2018-11-12 ENCOUNTER — Other Ambulatory Visit: Payer: Self-pay | Admitting: Gastroenterology

## 2018-12-07 ENCOUNTER — Other Ambulatory Visit: Payer: Self-pay

## 2018-12-10 ENCOUNTER — Other Ambulatory Visit: Payer: Self-pay | Admitting: Family Medicine

## 2018-12-10 ENCOUNTER — Ambulatory Visit (INDEPENDENT_AMBULATORY_CARE_PROVIDER_SITE_OTHER): Payer: Medicare Other | Admitting: Family Medicine

## 2018-12-10 ENCOUNTER — Other Ambulatory Visit: Payer: Self-pay

## 2018-12-10 ENCOUNTER — Telehealth: Payer: Self-pay

## 2018-12-10 ENCOUNTER — Encounter: Payer: Self-pay | Admitting: Family Medicine

## 2018-12-10 VITALS — BP 129/73 | HR 76 | Temp 98.4°F | Resp 16 | Ht 64.0 in | Wt 185.0 lb

## 2018-12-10 DIAGNOSIS — I10 Essential (primary) hypertension: Secondary | ICD-10-CM

## 2018-12-10 DIAGNOSIS — R7309 Other abnormal glucose: Secondary | ICD-10-CM

## 2018-12-10 DIAGNOSIS — M8589 Other specified disorders of bone density and structure, multiple sites: Secondary | ICD-10-CM | POA: Diagnosis not present

## 2018-12-10 DIAGNOSIS — Z952 Presence of prosthetic heart valve: Secondary | ICD-10-CM

## 2018-12-10 DIAGNOSIS — E7800 Pure hypercholesterolemia, unspecified: Secondary | ICD-10-CM

## 2018-12-10 DIAGNOSIS — Z7901 Long term (current) use of anticoagulants: Secondary | ICD-10-CM

## 2018-12-10 DIAGNOSIS — E669 Obesity, unspecified: Secondary | ICD-10-CM

## 2018-12-10 DIAGNOSIS — J3089 Other allergic rhinitis: Secondary | ICD-10-CM | POA: Diagnosis not present

## 2018-12-10 DIAGNOSIS — Z0000 Encounter for general adult medical examination without abnormal findings: Secondary | ICD-10-CM

## 2018-12-10 DIAGNOSIS — E559 Vitamin D deficiency, unspecified: Secondary | ICD-10-CM

## 2018-12-10 LAB — POCT GLYCOSYLATED HEMOGLOBIN (HGB A1C): Hemoglobin A1C: 5.4 % (ref 4.0–5.6)

## 2018-12-10 MED ORDER — ALENDRONATE SODIUM 35 MG PO TABS: 35.0000 mg | ORAL_TABLET | ORAL | 3 refills | Status: DC

## 2018-12-10 MED ORDER — CETIRIZINE HCL 10 MG PO TABS: 10.0000 mg | ORAL_TABLET | Freq: Every day | ORAL | 3 refills | Status: DC

## 2018-12-10 NOTE — Progress Notes (Signed)
Subjective:    Patient ID: [name], female    DOB: [birth year], 63 y.o.   MRN: [id number]  [name] is a 63 y.o. female presenting on 12/10/2018 for Pre-Diabetes   HPI   Elevated A1c / Obesity BMI >31 - No prior history of Diabetes - Recent trend inc A1c up to 5.7, now today lab result shows 5.4 with improvement Lifestyle: - Weight down 4-5 lbs in 6 months - Diet: No particular diet. Drinks half and half tea often, in future plans to go to [employer], working on improving water - Exercise: Trying to walk when she can, cannot if rains, still has some pain within R femur, has hardware prior surgery  O[facility], with T score stable in osteopenia range She has remained on prophylaxis dose of Alendronate 35mg  weekly, tolerating well, needs refill Denies fall or fracture  CHRONIC HTN: Reports no new concerns, some recent SBP readings 130s avg Current Meds - Lisinopril 40mg  daily, Metoprolol XL 100mg  daily   Reports good compliance, took meds today. Tolerating well, w/o complaints. Denies CP, dyspnea, HA, edema, dizziness / lightheadedness  Allergic Rhinitis Request re order Cetirizine refill  Chronic Atrial Fibrillation, s/p Mitral Valve Replacement, chronic anticoagulation Pulmonary HTN - Followed [facility] Dr Stanford Breed, continues to f/u with [facility], she had ECHO on 11/12/17 with normal LVEF and mild mod pulm HTN, lasix 20mg  every other day was added. Otherwise remains on anticoagulation with coumadin for s/p MVR, rate control for permanent AFib. - Next apt 01/26/19   Depression screen [facility] 2/9 12/10/2018 06/08/2018 06/01/2018  Decreased Interest 0 0 0  Down, Depressed, Hopeless 0 0 0  PHQ - 2 Score 0 0 0  Altered sleeping - - -  Tired, decreased energy - - -  Change in appetite - - -  Feeling bad or failure about yourself  - - -  Trouble concentrating - - -  Moving slowly or fidgety/restless - - -   Suicidal thoughts - - -  PHQ-9 Score - - -  Difficult doing work/chores - - -  Some recent data might be hidden    Social History   Tobacco Use  . Smoking status: Never Smoker  . Smokeless tobacco: Never Used  Substance Use Topics  . Alcohol use: No  . Drug use: No    Review of Systems Per HPI unless specifically indicated above     Objective:    BP 129/73   Pulse 76   Temp 98.4 F (36.9 C) (Oral)   Resp 16   Ht 5\' 4"  (1.626 m)   Wt 185 lb (83.9 kg)   BMI 31.76 kg/m   Wt Readings from Last 3 Encounters:  12/10/18 185 lb (83.9 kg)  06/08/18 189 lb 9.6 oz (86 kg)  06/01/18 191 lb 3.2 oz (86.7 kg)    Physical Exam Vitals signs and nursing note reviewed.  Constitutional:      General: She is not in acute distress.    Appearance: She is well-developed. She is not diaphoretic.     Comments: Well-appearing, comfortable, cooperative  HENT:     Head: Normocephalic and atraumatic.  Eyes:     General:        Right eye: No discharge.        Left eye: No discharge.     Conjunctiva/sclera: Conjunctivae normal.  Neck:     Musculoskeletal: Normal range of motion and neck supple.  Thyroid: No thyromegaly.  Cardiovascular:     Rate and Rhythm: Normal rate.     Heart sounds: Normal heart sounds. No murmur.     Comments: Irregularly irregular rhythm. Mechanical heart valve clicking Pulmonary:     Effort: Pulmonary effort is normal. No respiratory distress.     Breath sounds: Normal breath sounds. No wheezing or rales.  Musculoskeletal: Normal range of motion.  Lymphadenopathy:     Cervical: No cervical adenopathy.  Skin:    General: Skin is warm and dry.     Findings: No erythema or rash.  Neurological:     Mental Status: She is alert and oriented to person, place, and time.  Psychiatric:        Behavior: Behavior normal.     Comments: Well groomed, good eye contact, normal speech and thoughts       Recent Labs    12/17/17 0827 06/01/18 0856 12/10/18 0825   HGBA1C 5.6 5.7* 5.4     Results for orders placed or performed in visit on 12/10/18  POCT HgB A1C  Result Value Ref Range   Hemoglobin A1C 5.4 4.0 - 5.6 %      Assessment & Plan:   Problem List Items Addressed This Visit    Allergic rhinitis    Stable Re order Cetirizine      Relevant Medications   cetirizine (ZYRTEC) 10 MG tablet   Elevated hemoglobin A1c - Primary    Significantly improved now A1c 5.4, below range of PreDM Weight loss  Plan:  1. Not on any therapy currently - remain off 2. Encourage improved lifestyle - low carb, low sugar diet, limit sweet tea intake, reduce portion size, continue improving regular exercise 3. Follow-up 6 months labs A1c      Relevant Orders   POCT HgB A1C (Completed)   Essential hypertension    Controlled HTN. Mild elevated readings at first, repeat is normal - Home BP readings stable  Complicated by chronic Atrial Fibrillation, MVR, Pulm HTN    Plan:  1. Continue current BP regimen Lisinopril 40mg  daily, Metoprolol XL 100mg  daily 2. Encourage improved lifestyle - low sodium diet, regular exercise 3. Continue monitor BP outside office, bring readings to next visit, if persistently >140/90 or new symptoms notify office sooner      Osteopenia    Stable osteopenia without complication or new fracture Last DEXA 11/2017 - [facility] - On calcium supplement and vitamin D therapy (low vit D as well) - Concern high risk with prior hip and ankle fractures in past  Plan: Refill Alendronate bisphosphonate 35mg  weekly (started 05/2016 - anticipate rx for 3-5 years) Continue Vitamin D3 2,000 iu daily maintenance Repeat DEXA q 2 yr - NEXT due 2021      Relevant Medications   alendronate (FOSAMAX) 35 MG tablet      Meds ordered this encounter  Medications  . cetirizine (ZYRTEC) 10 MG tablet    Sig: Take 1 tablet (10 mg total) by mouth daily.    Dispense:  90 tablet    Refill:  3  . alendronate (FOSAMAX) 35 MG tablet     Sig: Take 1 tablet (35 mg total) by mouth every 7 (seven) days. Take with a full glass of water on an empty stomach.    Dispense:  12 tablet    Refill:  3     Follow up plan: Return in about 6 months (around 06/11/2019) for Annual Physical.  Future labs ordered for 05/2019  Nobie Putnam, [facility] 12/10/2018, 8:18 AM

## 2018-12-10 NOTE — Assessment & Plan Note (Addendum)
Stable osteopenia without complication or new fracture Last DEXA 11/2017 - stable [facility] - On calcium supplement and vitamin D therapy (low vit D as well) - Concern high risk with prior hip and ankle fractures in past  Plan: Refill Alendronate bisphosphonate 35mg  weekly (started 05/2016 - anticipate rx for 3-5 years) Continue Vitamin D3 2,000 iu daily maintenance Repeat DEXA q 2 yr - NEXT due 2021

## 2018-12-10 NOTE — Assessment & Plan Note (Signed)
Controlled HTN. Mild elevated readings at first, repeat is normal - Home BP readings stable  Complicated by chronic Atrial Fibrillation, MVR, Pulm HTN    Plan:  1. Continue current BP regimen Lisinopril 40mg  daily, Metoprolol XL 100mg  daily 2. Encourage improved lifestyle - low sodium diet, regular exercise 3. Continue monitor BP outside office, bring readings to next visit, if persistently >140/90 or new symptoms notify office sooner

## 2018-12-10 NOTE — Assessment & Plan Note (Signed)
Stable Re order Cetirizine

## 2018-12-10 NOTE — Telephone Encounter (Signed)
Attempted to contact pt to prescreen for COVID19 prior to her INR check on Monday, 6/22. Left detailed message advising her that I will be seeing her back in the office, that she will need to enter thru the [place] entrance of [facility], wear a mask, and to arrive early enough to be at her appt on time.  Asked her to call back w/ any questions or concerns.

## 2018-12-10 NOTE — Assessment & Plan Note (Signed)
Significantly improved now A1c 5.4, below range of PreDM Weight loss  Plan:  1. Not on any therapy currently - remain off 2. Encourage improved lifestyle - low carb, low sugar diet, limit sweet tea intake, reduce portion size, continue improving regular exercise 3. Follow-up 6 months labs A1c

## 2018-12-10 NOTE — Patient Instructions (Addendum)
Thank you for coming to the office today.  Keep up the great work  Not a pre diabetic  Recent Labs    12/17/17 0827 06/01/18 0856 12/10/18 0825  HGBA1C 5.6 5.7* 5.4   BP is controlled  Refilled Zyrtec and Alendronate for bones  DUE for FASTING BLOOD WORK (no food or drink after midnight before the lab appointment, only water or coffee without cream/sugar on the morning of)  SCHEDULE "Lab Only" visit in the morning at the clinic for lab draw in 6 MONTHS   - Make sure Lab Only appointment is at about 1 week before your next appointment, so that results will be available  For Lab Results, once available within 2-3 days of blood draw, you can can log in to MyChart online to view your results and a brief explanation. Also, we can discuss results at next follow-up visit.   Please schedule a Follow-up Appointment to: Return in about 6 months (around 06/11/2019) for Annual Physical.  If you have any other questions or concerns, please feel free to call the office or send a message through [place]. You may also schedule an earlier appointment if necessary.  Additionally, you may be receiving a survey about your experience at our office within a few days to 1 week by e-mail or mail. We value your feedback.  Nobie Putnam, DO [place]

## 2018-12-13 ENCOUNTER — Other Ambulatory Visit: Payer: Self-pay

## 2018-12-13 ENCOUNTER — Encounter: Payer: Self-pay | Admitting: Family Medicine

## 2018-12-13 ENCOUNTER — Ambulatory Visit: Payer: Medicare Other

## 2018-12-13 ENCOUNTER — Ambulatory Visit (INDEPENDENT_AMBULATORY_CARE_PROVIDER_SITE_OTHER): Payer: Medicare Other | Admitting: Family Medicine

## 2018-12-13 DIAGNOSIS — H9202 Otalgia, left ear: Secondary | ICD-10-CM | POA: Diagnosis not present

## 2018-12-13 DIAGNOSIS — I059 Rheumatic mitral valve disease, unspecified: Secondary | ICD-10-CM

## 2018-12-13 DIAGNOSIS — Z5181 Encounter for therapeutic drug level monitoring: Secondary | ICD-10-CM

## 2018-12-13 DIAGNOSIS — Z9889 Other specified postprocedural states: Secondary | ICD-10-CM | POA: Diagnosis not present

## 2018-12-13 DIAGNOSIS — I4891 Unspecified atrial fibrillation: Secondary | ICD-10-CM | POA: Diagnosis not present

## 2018-12-13 DIAGNOSIS — I482 Chronic atrial fibrillation, unspecified: Secondary | ICD-10-CM

## 2018-12-13 LAB — POCT INR: INR: 3.7 — AB (ref 2.0–3.0)

## 2018-12-13 NOTE — Progress Notes (Signed)
Virtual Visit via Telephone The purpose of this virtual visit is to provide medical care while limiting exposure to the novel coronavirus (COVID19) for both patient and office staff.  Consent was obtained for phone visit:  Yes.   Answered questions that patient had about telehealth interaction:  Yes.   I discussed the limitations, risks, security and privacy concerns of performing an evaluation and management service by telephone. I also discussed with the patient that there may be a patient responsible charge related to this service. The patient expressed understanding and agreed to proceed.  Patient Location: Home Provider Location: [facility])  ---------------------------------------------------------------------- Chief Complaint  Patient presents with  . Ear Pain    left side onset today was just morning     S: Reviewed CMA documentation. I have called patient and gathered additional HPI as follows:  ACUTE LEFT EAR PAIN Reports that symptoms started this morning, developed an acute sharp pain behind Left ear, seems to be behind ear near where lobe is, she has been sleeping on left side, and has sharp pains when chew or swallow. Seems to be fine now without any pain or symptoms. She uses topical deep blue and used that in the area and it resolved. - No known prior history of TMJ or arthritis in this area - Admits mild discharge out of ear - Denies any sinus pain or pressure or congestion  Denies any fevers, chills, sweats, body ache, cough, shortness of breath, sinus pain or pressure, headache, abdominal pain, diarrhea  Past Medical History:  Diagnosis Date  . Anticoagulated on warfarin   . Arthritis   . Chronic atrial fibrillation    SINCE 1997  . Dysrhythmia   . Goiter    multinodular  . Heart murmur   . History of hyperthyroidism    2003--  [place]  . Hypercholesterolemia   . Hyperlipidemia   . Hyperthyroidism    H/O  . Pulmonary hypertension,  mild ([place])   . Rheumatic heart disease    CARDIOLOGIST-- DR Marcello [place] [place]  . S/P MVR (mitral valve replacement)    1997  [facility]--  SECONDARY TO SEVERE MV STENOSIS   Social History   Tobacco Use  . Smoking status: Never Smoker  . Smokeless tobacco: Never Used  Substance Use Topics  . Alcohol use: No  . Drug use: No    Current Outpatient Medications:  .  acyclovir (ZOVIRAX) 400 MG tablet, Take 1 tablet (400 mg total) by mouth 3 (three) times daily. For 5 to 10 days or until healed for fever blister, Disp: 30 tablet, Rfl: 1 .  alendronate (FOSAMAX) 35 MG tablet, Take 1 tablet (35 mg total) by mouth every 7 (seven) days. Take with a full glass of water on an empty stomach., Disp: 12 tablet, Rfl: 3 .  amitriptyline (ELAVIL) 50 MG tablet, Take 50 mg by mouth at bedtime as needed for sleep. , Disp: , Rfl:  .  aspirin EC 81 MG tablet, Take 1 tablet (81 mg total) by mouth daily., Disp: 90 tablet, Rfl: 3 .  atorvastatin (LIPITOR) 40 MG tablet, TAKE 1 TABLET BY MOUTH EVERY EVENING., Disp: 90 tablet, Rfl: 3 .  calcium carbonate (OSCAL) 1500 (600 Ca) MG TABS tablet, Take 1 tablet by mouth daily. , Disp: , Rfl:  .  cetirizine (ZYRTEC) 10 MG tablet, Take 1 tablet (10 mg total) by mouth daily., Disp: 90 tablet, Rfl: 3 .  Cholecalciferol (VITAMIN D3) 2000 units capsule, Take 1 capsule (  2,000 Units total) by mouth daily., Disp: , Rfl:  .  cycloSPORINE (RESTASIS) 0.05 % ophthalmic emulsion, Place 1 drop into both eyes 2 (two) times daily as needed (dry eyes)., Disp: , Rfl:  .  dicyclomine (BENTYL) 10 MG capsule, TAKE 1 CAPSULE BY MOUTH 4 TIMES A DAY BEFORE MEALS AND AT BEDTIME., Disp: 90 capsule, Rfl: 1 .  fluticasone (FLONASE) 50 MCG/ACT nasal spray, Place 2 sprays into both nostrils daily., Disp: 16 g, Rfl: 11 .  ibuprofen (ADVIL,MOTRIN) 200 MG tablet, Take 200 mg by mouth 2 (two) times daily as needed for headache or moderate pain., Disp: , Rfl:  .  lisinopril (PRINIVIL,ZESTRIL) 40 MG tablet, TAKE 1  TABLET BY MOUTH ONCE DAILY, Disp: 90 tablet, Rfl: 0 .  metoprolol succinate (TOPROL-XL) 100 MG 24 hr tablet, TAKE 1 TABLET BY MOUTH ONCE A DAY ** TAKE WITH OR IMMEDIATELY FOLLOWING A MEAL, Disp: 90 tablet, Rfl: 0 .  Multiple Vitamins-Minerals (CENTRUM SILVER ADULT 50+ PO), Take 1 tablet by mouth daily., Disp: , Rfl:  .  warfarin (COUMADIN) 5 MG tablet, TAKE 1 TABLET BY MOUTH DAILY OR AS DIRECTED BY COUMADIN CLINIC, Disp: 90 tablet, Rfl: 1 .  amoxicillin (AMOXIL) 500 MG tablet, Take all 4 tablets one hour prior to procedure, Disp: 4 tablet, Rfl: 6 .  furosemide (LASIX) 20 MG tablet, Take 1 tablet (20 mg total) by mouth every other day., Disp: 45 tablet, Rfl: 3 .  warfarin (COUMADIN) 5 MG tablet, TAKE 1 TABLET BY MOUTH ONCE DAILY OR AS DIRECTED BY COUMADIN CLINIC, Disp: 90 tablet, Rfl: 1  Depression screen [facility] 2/9 12/13/2018 12/10/2018 06/08/2018  Decreased Interest 0 0 0  Down, Depressed, Hopeless 0 0 0  PHQ - 2 Score 0 0 0  Altered sleeping - - -  Tired, decreased energy - - -  Change in appetite - - -  Feeling bad or failure about yourself  - - -  Trouble concentrating - - -  Moving slowly or fidgety/restless - - -  Suicidal thoughts - - -  PHQ-9 Score - - -  Difficult doing work/chores - - -  Some recent data might be hidden    GAD 7 : Generalized Anxiety Score 10/02/2015  Nervous, Anxious, on Edge 1  Control/stop worrying 0  Worry too much - different things 0  Trouble relaxing 2  Restless 2  Easily annoyed or irritable 0  Afraid - awful might happen 0  Total GAD 7 Score 5  Anxiety Difficulty Somewhat difficult    -------------------------------------------------------------------------- O: No physical exam performed due to remote telephone encounter.  Lab results reviewed.  Recent Results (from the past 2160 hour(s))  POCT INR     Status: Abnormal   Collection Time: 10/11/18 11:57 AM  Result Value Ref Range   INR 3.9 (A) 2.0 - 3.0  POCT INR     Status: Abnormal    Collection Time: 11/08/18 11:53 AM  Result Value Ref Range   INR 3.2 (A) 2.0 - 3.0  POCT HgB A1C     Status: Normal   Collection Time: 12/10/18  8:25 AM  Result Value Ref Range   Hemoglobin A1C 5.4 4.0 - 5.6 %  POCT INR     Status: Abnormal   Collection Time: 12/13/18  8:23 AM  Result Value Ref Range   INR 3.7 (A) 2.0 - 3.0    -------------------------------------------------------------------------- A&P:  Problem List Items Addressed This Visit    None    Visit Diagnoses  Acute ear pain, left    -  Primary     Clinically uncertain if acute ear infection vs sinus / eustachian tube related, otherwise could be possible TMJ or joint related pain, since improved w/ topical muscle rub  Plan - Closely monitor symptoms for next 48-72 hours - Conservative care options reviewed - HOLD Antibiotics right now - Notify us mychart or phone if worsening or new concerns, we can send Antibiotic amoxicillin if develops sinus or ear infection symptoms or other sick symptoms - Consider dentist if concern for TMJ Follow-up as advised if not improve  No orders of the defined types were placed in this encounter.   Follow-up: - Return in 1 week as needed if not improved  Patient verbalizes understanding with the above medical recommendations including the limitation of remote medical advice.  Specific follow-up and call-back criteria were given for patient to follow-up or seek medical care more urgently if needed.   - Time spent in direct consultation with patient on phone: 9 minutes  Nobie Putnam, [facility] 12/13/2018, 4:34 PM

## 2018-12-13 NOTE — Patient Instructions (Addendum)
It may be too early to tell if you have an ear infection or if it is the jaw - such as TMJ or jaw joint arthritis or pain.  May do warm compress or muscle rub as needed  Monitor symptoms for now  If you develop signs of ear or sinus infection, notify me on MyChart or call and I can send in an amoxicillin antibiotic for you.  May need to check with Dentist if jaw pain, can do x-rays and evaluate for jaw dysfunction or arthritis.   Please schedule a Follow-up Appointment to: Return in about 1 week (around 12/20/2018), or if symptoms worsen or fail to improve, for ear pain.  If you have any other questions or concerns, please feel free to call the office or send a message through [place]. You may also schedule an earlier appointment if necessary.  Additionally, you may be receiving a survey about your experience at our office within a few days to 1 week by e-mail or mail. We value your feedback.  Nobie Putnam, DO [place]

## 2018-12-13 NOTE — Patient Instructions (Signed)
Please have a large serving of greens today and continue dosage of 1 tablet every day, EXCEPT 1/2 TABLET ON MONDAYS, [place]. Recheck in 6 weeks.

## 2018-12-31 ENCOUNTER — Other Ambulatory Visit: Payer: Self-pay | Admitting: Cardiology

## 2018-12-31 NOTE — Telephone Encounter (Signed)
Rx(s) sent to pharmacy electronically.  

## 2019-01-20 NOTE — Progress Notes (Signed)
HPI: FU atrial fibrillation and previous mitral valve replacement for rheumatic heart disease. Patient had mitral valve replacement in 1997. Cardiac catheterization in 2008 showed normal coronary arteries. ABIs with Doppler in July of 2014 normal. Holter monitor January 2015 showed heart rate was elevated and beta blocker was increased.  Echocardiogram May 2019 showed normal LV function, mechanical mitral valve prosthesis with mean gradient 5 mmHg, severe left atrial enlargement and mild right ventricular enlargement.  Since last seen, the patient denies any dyspnea on exertion, orthopnea, PND, pedal edema, palpitations, syncope or chest pain.    Current Outpatient Medications  Medication Sig Dispense Refill  . acyclovir (ZOVIRAX) 400 MG tablet Take 1 tablet (400 mg total) by mouth 3 (three) times daily. For 5 to 10 days or until healed for fever blister 30 tablet 1  . alendronate (FOSAMAX) 35 MG tablet Take 1 tablet (35 mg total) by mouth every 7 (seven) days. Take with a full glass of water on an empty stomach. 12 tablet 3  . amitriptyline (ELAVIL) 50 MG tablet Take 50 mg by mouth at bedtime as needed for sleep.     Marland Kitchen amoxicillin (AMOXIL) 500 MG capsule Take 4 capsules (2,000 mg total) by mouth once as needed for up to 1 dose (1 hour prior to procedure). 4 capsule 0  . aspirin EC 81 MG tablet Take 1 tablet (81 mg total) by mouth daily. 90 tablet 3  . atorvastatin (LIPITOR) 40 MG tablet TAKE 1 TABLET BY MOUTH EVERY EVENING. 90 tablet 3  . calcium carbonate (OSCAL) 1500 (600 Ca) MG TABS tablet Take 1 tablet by mouth daily.     . cetirizine (ZYRTEC) 10 MG tablet Take 1 tablet (10 mg total) by mouth daily. 90 tablet 3  . Cholecalciferol (VITAMIN D3) 2000 units capsule Take 1 capsule (2,000 Units total) by mouth daily.    . cycloSPORINE (RESTASIS) 0.05 % ophthalmic emulsion Place 1 drop into both eyes 2 (two) times daily as needed (dry eyes).    Marland Kitchen dicyclomine (BENTYL) 10 MG capsule TAKE 1  CAPSULE BY MOUTH 4 TIMES A DAY BEFORE MEALS AND AT BEDTIME. 90 capsule 1  . fluticasone (FLONASE) 50 MCG/ACT nasal spray Place 2 sprays into both nostrils daily. 16 g 11  . ibuprofen (ADVIL,MOTRIN) 200 MG tablet Take 200 mg by mouth 2 (two) times daily as needed for headache or moderate pain.    Marland Kitchen lisinopril (PRINIVIL,ZESTRIL) 40 MG tablet TAKE 1 TABLET BY MOUTH ONCE DAILY 90 tablet 0  . metoprolol succinate (TOPROL-XL) 100 MG 24 hr tablet TAKE 1 TABLET BY MOUTH ONCE A DAY ** TAKE WITH OR IMMEDIATELY FOLLOWING A MEAL 90 tablet 0  . Multiple Vitamins-Minerals (CENTRUM SILVER ADULT 50+ PO) Take 1 tablet by mouth daily.    Marland Kitchen warfarin (COUMADIN) 5 MG tablet TAKE 1 TABLET BY MOUTH DAILY OR AS DIRECTED BY COUMADIN CLINIC 90 tablet 1  . warfarin (COUMADIN) 5 MG tablet TAKE 1 TABLET BY MOUTH ONCE DAILY OR AS DIRECTED BY COUMADIN CLINIC 90 tablet 1  . furosemide (LASIX) 20 MG tablet Take 1 tablet (20 mg total) by mouth every other day. 45 tablet 3   No current facility-administered medications for this visit.      Past Medical History:  Diagnosis Date  . Anticoagulated on warfarin   . Arthritis   . Chronic atrial fibrillation    SINCE 1997  . Dysrhythmia   . Goiter    multinodular  . Heart murmur   .  History of hyperthyroidism    2003--  PTU [place]  . Hypercholesterolemia   . Hyperlipidemia   . Hyperthyroidism    H/O  . Pulmonary hypertension, mild ([place])   . Rheumatic heart disease    CARDIOLOGIST-- DR Marcello Moores [place]  . S/P MVR (mitral valve replacement)    1997  [facility]--  SECONDARY TO SEVERE MV STENOSIS    Past Surgical History:  Procedure Laterality Date  . ANTERIOR CERVICAL DECOMP/DISCECTOMY FUSION  12-21-2001   C5 --  C6  . BREAST BIOPSY Left 11/03/2016   Procedure: BREAST BIOPSY WITH NEEDLE LOCALIZATION;  Surgeon: Robert Bellow, MD;  Location: [facility] ORS;  Service: General;  Laterality: Left;  . BREAST EXCISIONAL BIOPSY Left 11/03/2016   excision of calcs. benign  . CARDIAC  CATHETERIZATION  12-28-2006  DR [facility]   NORMAL CORONARY ARTERIES  . CARPAL TUNNEL RELEASE Right 01-20-2007  . COLONOSCOPY  2015  . ERCP  08/15/2011   Procedure: ENDOSCOPIC RETROGRADE CHOLANGIOPANCREATOGRAPHY (ERCP);  Surgeon: Jeryl Columbia, MD;  Location[facility] ENDOSCOPY;  Service: Endoscopy;  Laterality: N/A;  . ERCP     MULTIPLE--  INCLUDING STENTS, SPHINTEROTOMY'S  STONE REMOVAL   . FEMUR IM NAIL Right 04/26/2014   Procedure: INTRAMEDULLARY (IM) NAIL FEMORAL;  Surgeon: Alta Corning, MD;  Location: [place];  Service: Orthopedics;  Laterality: Right;  . I&D EXTREMITY Right 02/07/2013   Procedure: IRRIGATION AND DEBRIDEMENT OF RIGHT LOWER LEG WITH PLACEMENT OF ACELL AND WOULND VAC;  Surgeon: Theodoro Kos, DO;  Location: [place];  Service: Plastics;  Laterality: Right;  . LAPAROSCOPIC CHOLECYSTECTOMY  04-08-2002  . MITRAL VALVE REPLACEMENT  1997   [facility]  . ORIF ANKLE FRACTURE Right 11/05/2012   Procedure: OPEN REDUCTION INTERNAL FIXATION (ORIF) ANKLE FRACTURE only operating on right;  Surgeon: Alta Corning, MD;  Location: [place];  Service: Orthopedics;  Laterality: Right;  . ORIF RIGHT ULNAR FX W/ ILIAC CREST BONE GRAFT  10-06-2008  . ORIF WRIST FRACTURE Right 04/26/2014   Procedure: OPEN REDUCTION INTERNAL FIXATION (ORIF) WRIST FRACTURE;  Surgeon: Alta Corning, MD;  Location: [place];  Service: Orthopedics;  Laterality: Right;  . TRANSTHORACIC ECHOCARDIOGRAM  06-29-2009  dr wall   normal lvsf/ ef 55-60%/  normal bileaflet mechanical mvr/ moderated dilated la/ moderate tr  . WRIST ARTHROSCOPY Right 12-10-2007   debridement and ulnar shortening osteotomy w/ plate    Social History   Socioeconomic History  . Marital status: Divorced    Spouse name: Not on file  . Number of children: 0  . Years of education: 51  . Highest education level: 12th grade  Occupational History  . 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 Financial resource strain:  Not very hard  . Food insecurity    Worry: Never true    Inability: Never true  . Transportation needs    Medical: No    Non-medical: No  Tobacco Use  . Smoking status: Never Smoker  . Smokeless tobacco: Never Used  Substance and Sexual Activity  . Alcohol use: No  . Drug use: No  . Sexual activity: Not on file  Lifestyle  . Physical activity    Days per week: 1 day    Minutes per session: 60 min  . Stress: Not at all  Relationships  . Social connections    Talks on phone: More than three times a week    Gets together: More than three times  a week    Attends religious service: More than 4 times per year    Active member of club or organization: Yes    Attends meetings of clubs or organizations: More than 4 times per year    Relationship status: Divorced  . Intimate partner violence    Fear of current or ex partner: No    Emotionally abused: No    Physically abused: No    Forced sexual activity: No  Other Topics Concern  . Not on file  Social History Narrative  . Not on file    Family History  Problem Relation Age of Onset  . Hypothyroidism Sister   . Gallbladder disease Sister   . Coronary artery disease Other   . Diabetes Mother   . Hypertension Mother   . Heart disease Father   . Stroke Father   . Breast cancer Neg Hx     ROS: no fevers or chills, productive cough, hemoptysis, dysphasia, odynophagia, melena, hematochezia, dysuria, hematuria, rash, seizure activity, orthopnea, PND, pedal edema, claudication. Remaining systems are negative.  Physical Exam: Well-developed well-nourished in no acute distress.  Skin is warm and dry.  HEENT is normal.  Neck is supple.  Chest is clear to auscultation with normal expansion.  Cardiovascular exam is irregular, crisp mechanical valve Abdominal exam nontender or distended. No masses palpated. Extremities show no edema. neuro grossly intact  ECG-atrial fibrillation at a rate of 80, no ST changes.  Personally  reviewed  A/P  1 prior mitral valve replacement-continue SBE prophylaxis.  Continue Coumadin and aspirin.  Patient will have dental extraction in the next several months.  Given that she has a prosthetic valve in the mitral position she will need a Lovenox bridge if Coumadin is held.  We will arrange.  We also discussed the importance of SBE prophylaxis prior to her procedure.  2 hypertension-patient's blood pressure is controlled.  Continue present medications and follow.  3 permanent atrial fibrillation-we will continue with beta-blocker for rate control.  Continue Coumadin.  4 Hyperlipidemia-continue statin.    Kirk Ruths, MD

## 2019-01-24 ENCOUNTER — Ambulatory Visit (INDEPENDENT_AMBULATORY_CARE_PROVIDER_SITE_OTHER): Payer: Medicare Other

## 2019-01-24 ENCOUNTER — Other Ambulatory Visit: Payer: Self-pay

## 2019-01-24 DIAGNOSIS — Z9889 Other specified postprocedural states: Secondary | ICD-10-CM

## 2019-01-24 DIAGNOSIS — Z5181 Encounter for therapeutic drug level monitoring: Secondary | ICD-10-CM

## 2019-01-24 DIAGNOSIS — I059 Rheumatic mitral valve disease, unspecified: Secondary | ICD-10-CM | POA: Diagnosis not present

## 2019-01-24 DIAGNOSIS — I482 Chronic atrial fibrillation, unspecified: Secondary | ICD-10-CM | POA: Diagnosis not present

## 2019-01-24 DIAGNOSIS — I4891 Unspecified atrial fibrillation: Secondary | ICD-10-CM

## 2019-01-24 LAB — POCT INR: INR: 3.7 — AB (ref 2.0–3.0)

## 2019-01-24 NOTE — Patient Instructions (Signed)
Please skip coumadin today, then continue dosage of 1 tablet every day, EXCEPT 1/2 TABLET ON MONDAYS, [place]. Recheck in 4 weeks.

## 2019-01-26 ENCOUNTER — Other Ambulatory Visit: Payer: Self-pay

## 2019-01-26 ENCOUNTER — Encounter: Payer: Self-pay | Admitting: Cardiology

## 2019-01-26 ENCOUNTER — Ambulatory Visit (INDEPENDENT_AMBULATORY_CARE_PROVIDER_SITE_OTHER): Payer: Medicare Other | Admitting: Cardiology

## 2019-01-26 VITALS — BP 126/78 | HR 80 | Temp 97.7°F | Ht 64.0 in | Wt 183.0 lb

## 2019-01-26 DIAGNOSIS — I4821 Permanent atrial fibrillation: Secondary | ICD-10-CM

## 2019-01-26 DIAGNOSIS — Z9889 Other specified postprocedural states: Secondary | ICD-10-CM | POA: Diagnosis not present

## 2019-01-26 DIAGNOSIS — I10 Essential (primary) hypertension: Secondary | ICD-10-CM | POA: Diagnosis not present

## 2019-01-26 DIAGNOSIS — E7800 Pure hypercholesterolemia, unspecified: Secondary | ICD-10-CM | POA: Diagnosis not present

## 2019-01-26 NOTE — Patient Instructions (Signed)

## 2019-02-08 ENCOUNTER — Other Ambulatory Visit: Payer: Self-pay | Admitting: Cardiology

## 2019-02-08 DIAGNOSIS — I10 Essential (primary) hypertension: Secondary | ICD-10-CM

## 2019-02-08 DIAGNOSIS — R0609 Other forms of dyspnea: Secondary | ICD-10-CM

## 2019-02-21 ENCOUNTER — Other Ambulatory Visit: Payer: Self-pay

## 2019-02-21 ENCOUNTER — Ambulatory Visit (INDEPENDENT_AMBULATORY_CARE_PROVIDER_SITE_OTHER): Payer: Medicare Other

## 2019-02-21 DIAGNOSIS — Z9889 Other specified postprocedural states: Secondary | ICD-10-CM

## 2019-02-21 DIAGNOSIS — I059 Rheumatic mitral valve disease, unspecified: Secondary | ICD-10-CM

## 2019-02-21 DIAGNOSIS — Z5181 Encounter for therapeutic drug level monitoring: Secondary | ICD-10-CM

## 2019-02-21 DIAGNOSIS — I4891 Unspecified atrial fibrillation: Secondary | ICD-10-CM

## 2019-02-21 DIAGNOSIS — I482 Chronic atrial fibrillation, unspecified: Secondary | ICD-10-CM | POA: Diagnosis not present

## 2019-02-21 LAB — POCT INR: INR: 3.5 — AB (ref 2.0–3.0)

## 2019-02-21 MED ORDER — ENOXAPARIN SODIUM 80 MG/0.8ML ~~LOC~~ SOLN: 80.0000 mg | Freq: Two times a day (BID) | SUBCUTANEOUS | 1 refills | Status: DC

## 2019-02-21 NOTE — Patient Instructions (Addendum)
Please continue dosage of 1 tablet every day, EXCEPT 1/2 TABLET ON MONDAYS, [place]. Follow Lovenox bridging instructions for dental procedure on 03/07/19.   Tuesday, 9/8: Last dose of Coumadin.  Wednesday, 9/9: No Coumadin or Lovenox.  Thursday, 9/10: Inject Lovenox 80mg  in the fatty abdominal tissue at least 2 inches from the belly button twice a day about 12 hours apart, 8am and 8pm rotate sites. No Coumadin.  Friday, 9/11: Inject Lovenox in the fatty tissue every 12 hours, 8am and 8pm. No Coumadin.  Saturday, 9/12: Inject Lovenox in the fatty tissue every 12 hours, 8am and 8pm. No Coumadin.  Sunday, 9/13: Inject Lovenox in the fatty tissue in the morning at 8 am (No PM dose). No Coumadin.  Monday, 9/14: Procedure Day - No Lovenox - Resume Coumadin in the evening or as directed by doctor (take an extra half tablet with usual dose for 2 days then resume normal dose).  Tuesday, 9/15: Resume Lovenox injection in the fatty tissue every 12 hours and take Coumadin (w/ extra 1/2 dose)  Wednesday, 9/16: Inject Lovenox in the fatty tissue every 12 hours and take Coumadin.  Thursday, 9/17: Inject Lovenox in the fatty tissue every 12 hours and take Coumadin.  Friday, 9/18: Inject Lovenox in the fatty tissue every 12 hours and take Coumadin.  Saturday, 9/19: Inject Lovenox in the fatty tissue every 12 hours and take Coumadin.  Sunday, 9/20:  Inject Lovenox in the fatty tissue every 12 hours and take Coumadin..  Monday, 9/21: Inject Lovenox in the fatty tissue in the AM.  Recheck INR.

## 2019-02-26 DIAGNOSIS — Z23 Encounter for immunization: Secondary | ICD-10-CM | POA: Diagnosis not present

## 2019-03-07 DIAGNOSIS — R6889 Other general symptoms and signs: Secondary | ICD-10-CM | POA: Diagnosis not present

## 2019-03-08 ENCOUNTER — Telehealth: Payer: Self-pay | Admitting: Cardiology

## 2019-03-08 NOTE — Telephone Encounter (Signed)
Called Cathy back, but she did not answer- left message advising more information on why the Warfarin should be held for one more day to advise with MD and PharmD. Left call back number.

## 2019-03-08 NOTE — Telephone Encounter (Signed)
New Message      Tye [place]  Is the Dentist calling and would like for the pt to hold Warfarin for one more day, she is suppose to start it back tonight but she would feel better with the clotting if the pt waiting one more day     Please call

## 2019-03-08 NOTE — Telephone Encounter (Signed)
Dr Ronnald Ramp spoke with Dr Gwenlyn Found  In regards to patient   - patient had some teeth pulled -  Patient is clotting form extraction but still has little oozing from area - wanting to know if can hold warafarin one more day.  INFORMATION WAS ALSO DISCUSSED WITH [facility] -  DR BERRY QUESTION ABOUT LOVENOX BRIDGING PER PHARMACIST, RESTART BOTH WARFARIN AND LOVENOX TOMORROW   CALLED PATIENT AND MADE HER AWARE . SHE VERBALIZED UNDERSTNADING

## 2019-03-14 ENCOUNTER — Other Ambulatory Visit: Payer: Self-pay

## 2019-03-14 ENCOUNTER — Ambulatory Visit (INDEPENDENT_AMBULATORY_CARE_PROVIDER_SITE_OTHER): Payer: Medicare Other

## 2019-03-14 DIAGNOSIS — Z9889 Other specified postprocedural states: Secondary | ICD-10-CM

## 2019-03-14 DIAGNOSIS — I4891 Unspecified atrial fibrillation: Secondary | ICD-10-CM | POA: Diagnosis not present

## 2019-03-14 DIAGNOSIS — I482 Chronic atrial fibrillation, unspecified: Secondary | ICD-10-CM | POA: Diagnosis not present

## 2019-03-14 DIAGNOSIS — Z5181 Encounter for therapeutic drug level monitoring: Secondary | ICD-10-CM

## 2019-03-14 DIAGNOSIS — I059 Rheumatic mitral valve disease, unspecified: Secondary | ICD-10-CM | POA: Diagnosis not present

## 2019-03-14 LAB — POCT INR: INR: 4.5 — AB (ref 2.0–3.0)

## 2019-03-14 NOTE — Patient Instructions (Signed)
STOP LOVENOX!  ; ) Please skip warfarin tonight & tomorrow, then START NEW DOSAGE of 1/2 tablet every day, EXCEPT 1 TABLET ON MONDAYS, [place]. Recheck in 2 weeks - we may have to reduce your dosage again until you are able to eat regular foods.

## 2019-03-28 ENCOUNTER — Other Ambulatory Visit: Payer: Self-pay

## 2019-03-28 ENCOUNTER — Ambulatory Visit (INDEPENDENT_AMBULATORY_CARE_PROVIDER_SITE_OTHER): Payer: Medicare Other

## 2019-03-28 DIAGNOSIS — I482 Chronic atrial fibrillation, unspecified: Secondary | ICD-10-CM

## 2019-03-28 DIAGNOSIS — I4891 Unspecified atrial fibrillation: Secondary | ICD-10-CM

## 2019-03-28 DIAGNOSIS — Z9889 Other specified postprocedural states: Secondary | ICD-10-CM

## 2019-03-28 DIAGNOSIS — I059 Rheumatic mitral valve disease, unspecified: Secondary | ICD-10-CM

## 2019-03-28 DIAGNOSIS — Z5181 Encounter for therapeutic drug level monitoring: Secondary | ICD-10-CM | POA: Diagnosis not present

## 2019-03-28 LAB — POCT INR: INR: 3 (ref 2.0–3.0)

## 2019-03-28 NOTE — Patient Instructions (Signed)
Please continue dosage of 1/2 tablet every day, EXCEPT 1 TABLET ON MONDAYS, [place]. Recheck in 3 weeks.

## 2019-04-12 ENCOUNTER — Telehealth: Payer: Self-pay | Admitting: Cardiology

## 2019-04-12 NOTE — Telephone Encounter (Signed)
Patient requesting a letter from Dr Stanford Breed stating the her teeth being pulled and getting dentures were needed secondary to her valve disease. He teeth were breaking off Her insurance will not pay for them unless she has a note from physician. Will forward to Dr Stanford Breed for review

## 2019-04-12 NOTE — Telephone Encounter (Signed)
New message  Patient would like a call to discuss having a letter written for dental surgery. Please call.

## 2019-04-13 NOTE — Telephone Encounter (Signed)
Okay for note stating that she needed teeth pulled and dentures because of mitral valve replacement (increased risk of bacteremia and prosthetic valve endocarditis with dental issues/infected teeth). Kirk Ruths

## 2019-04-14 ENCOUNTER — Encounter: Payer: Self-pay | Admitting: *Deleted

## 2019-04-14 NOTE — Telephone Encounter (Signed)
Spoke with pt, aware letter generated and placed at the front desk for patient pick up.

## 2019-04-18 ENCOUNTER — Other Ambulatory Visit: Payer: Self-pay

## 2019-04-18 ENCOUNTER — Ambulatory Visit (INDEPENDENT_AMBULATORY_CARE_PROVIDER_SITE_OTHER): Payer: Medicare Other

## 2019-04-18 DIAGNOSIS — Z5181 Encounter for therapeutic drug level monitoring: Secondary | ICD-10-CM | POA: Diagnosis not present

## 2019-04-18 DIAGNOSIS — I4891 Unspecified atrial fibrillation: Secondary | ICD-10-CM | POA: Diagnosis not present

## 2019-04-18 DIAGNOSIS — I059 Rheumatic mitral valve disease, unspecified: Secondary | ICD-10-CM

## 2019-04-18 DIAGNOSIS — Z9889 Other specified postprocedural states: Secondary | ICD-10-CM | POA: Diagnosis not present

## 2019-04-18 DIAGNOSIS — I482 Chronic atrial fibrillation, unspecified: Secondary | ICD-10-CM | POA: Diagnosis not present

## 2019-04-18 LAB — POCT INR: INR: 4 — AB (ref 2.0–3.0)

## 2019-04-18 NOTE — Patient Instructions (Signed)
This week, take 1/2 tablet every day until resuming w/ your whole tablet on Friday, then continue dosage of 1/2 tablet every day, EXCEPT 1 TABLET ON MONDAYS, [place]. Recheck in 3 weeks.

## 2019-05-05 ENCOUNTER — Other Ambulatory Visit: Payer: Self-pay | Admitting: Family Medicine

## 2019-05-05 DIAGNOSIS — J3089 Other allergic rhinitis: Secondary | ICD-10-CM

## 2019-05-05 DIAGNOSIS — B001 Herpesviral vesicular dermatitis: Secondary | ICD-10-CM

## 2019-05-09 ENCOUNTER — Other Ambulatory Visit: Payer: Self-pay

## 2019-05-09 ENCOUNTER — Ambulatory Visit (INDEPENDENT_AMBULATORY_CARE_PROVIDER_SITE_OTHER): Payer: Medicare Other

## 2019-05-09 DIAGNOSIS — I059 Rheumatic mitral valve disease, unspecified: Secondary | ICD-10-CM

## 2019-05-09 DIAGNOSIS — I482 Chronic atrial fibrillation, unspecified: Secondary | ICD-10-CM

## 2019-05-09 DIAGNOSIS — Z5181 Encounter for therapeutic drug level monitoring: Secondary | ICD-10-CM | POA: Diagnosis not present

## 2019-05-09 DIAGNOSIS — I4891 Unspecified atrial fibrillation: Secondary | ICD-10-CM | POA: Diagnosis not present

## 2019-05-09 DIAGNOSIS — Z9889 Other specified postprocedural states: Secondary | ICD-10-CM | POA: Diagnosis not present

## 2019-05-09 LAB — POCT INR: INR: 3.8 — AB (ref 2.0–3.0)

## 2019-05-09 NOTE — Patient Instructions (Signed)
PLEASE START NEW DOSAGE of 1/2 tablet every day, EXCEPT 1 TABLET on WEDNESDAYS.  Recheck in 2 weeks.

## 2019-05-25 ENCOUNTER — Ambulatory Visit (INDEPENDENT_AMBULATORY_CARE_PROVIDER_SITE_OTHER): Payer: Medicare Other

## 2019-05-25 ENCOUNTER — Other Ambulatory Visit: Payer: Self-pay

## 2019-05-25 DIAGNOSIS — Z9889 Other specified postprocedural states: Secondary | ICD-10-CM

## 2019-05-25 DIAGNOSIS — I4891 Unspecified atrial fibrillation: Secondary | ICD-10-CM | POA: Diagnosis not present

## 2019-05-25 DIAGNOSIS — I059 Rheumatic mitral valve disease, unspecified: Secondary | ICD-10-CM | POA: Diagnosis not present

## 2019-05-25 DIAGNOSIS — Z5181 Encounter for therapeutic drug level monitoring: Secondary | ICD-10-CM

## 2019-05-25 DIAGNOSIS — I482 Chronic atrial fibrillation, unspecified: Secondary | ICD-10-CM | POA: Diagnosis not present

## 2019-05-25 LAB — POCT INR: INR: 2.7 (ref 2.0–3.0)

## 2019-05-25 NOTE — Patient Instructions (Signed)
PLEASE START NEW DOSAGE of 1/2 tablet every day, EXCEPT 1 TABLET on  SUNDAYS & WEDNESDAYS.  Recheck in 2 weeks.

## 2019-05-31 ENCOUNTER — Other Ambulatory Visit: Payer: Self-pay

## 2019-05-31 ENCOUNTER — Other Ambulatory Visit: Payer: Medicare Other

## 2019-05-31 DIAGNOSIS — Z952 Presence of prosthetic heart valve: Secondary | ICD-10-CM

## 2019-05-31 DIAGNOSIS — E559 Vitamin D deficiency, unspecified: Secondary | ICD-10-CM | POA: Diagnosis not present

## 2019-05-31 DIAGNOSIS — I10 Essential (primary) hypertension: Secondary | ICD-10-CM

## 2019-05-31 DIAGNOSIS — E7800 Pure hypercholesterolemia, unspecified: Secondary | ICD-10-CM

## 2019-05-31 DIAGNOSIS — R7309 Other abnormal glucose: Secondary | ICD-10-CM | POA: Diagnosis not present

## 2019-05-31 DIAGNOSIS — Z0000 Encounter for general adult medical examination without abnormal findings: Secondary | ICD-10-CM

## 2019-05-31 DIAGNOSIS — Z7901 Long term (current) use of anticoagulants: Secondary | ICD-10-CM

## 2019-06-01 LAB — CBC WITH DIFFERENTIAL/PLATELET
Absolute Monocytes: 403 cells/uL (ref 200–950)
Basophils Absolute: 29 cells/uL (ref 0–200)
Basophils Relative: 0.6 %
Eosinophils Absolute: 91 cells/uL (ref 15–500)
Eosinophils Relative: 1.9 %
HCT: 36.5 % (ref 35.0–45.0)
Hemoglobin: 11.5 g/dL — ABNORMAL LOW (ref 11.7–15.5)
Lymphs Abs: 1176 cells/uL (ref 850–3900)
MCH: 27 pg (ref 27.0–33.0)
MCHC: 31.5 g/dL — ABNORMAL LOW (ref 32.0–36.0)
MCV: 85.7 fL (ref 80.0–100.0)
MPV: 13.2 fL — ABNORMAL HIGH (ref 7.5–12.5)
Monocytes Relative: 8.4 %
Neutro Abs: 3101 cells/uL (ref 1500–7800)
Neutrophils Relative %: 64.6 %
Platelets: 175 10*3/uL (ref 140–400)
RBC: 4.26 10*6/uL (ref 3.80–5.10)
RDW: 13.7 % (ref 11.0–15.0)
Total Lymphocyte: 24.5 %
WBC: 4.8 10*3/uL (ref 3.8–10.8)

## 2019-06-01 LAB — COMPLETE METABOLIC PANEL WITH GFR
AG Ratio: 1.1 (calc) (ref 1.0–2.5)
ALT: 14 U/L (ref 6–29)
AST: 21 U/L (ref 10–35)
Albumin: 3.7 g/dL (ref 3.6–5.1)
Alkaline phosphatase (APISO): 93 U/L (ref 37–153)
BUN: 10 mg/dL (ref 7–25)
CO2: 25 mmol/L (ref 20–32)
Calcium: 9 mg/dL (ref 8.6–10.4)
Chloride: 107 mmol/L (ref 98–110)
Creat: 0.96 mg/dL (ref 0.50–0.99)
GFR, Est African American: 72 mL/min/{1.73_m2} (ref >=60)
GFR, Est Non African American: 62 mL/min/{1.73_m2} (ref >=60)
Globulin: 3.4 g/dL (calc) (ref 1.9–3.7)
Glucose, Bld: 97 mg/dL (ref 65–99)
Potassium: 4.1 mmol/L (ref 3.5–5.3)
Sodium: 143 mmol/L (ref 135–146)
Total Bilirubin: 0.6 mg/dL (ref 0.2–1.2)
Total Protein: 7.1 g/dL (ref 6.1–8.1)

## 2019-06-01 LAB — LIPID PANEL
Cholesterol: 126 mg/dL (ref <200)
HDL: 48 mg/dL — ABNORMAL LOW (ref >=50)
LDL Cholesterol (Calc): 61 mg/dL (calc)
Non-HDL Cholesterol (Calc): 78 mg/dL (calc) (ref <130)
Total CHOL/HDL Ratio: 2.6 (calc) (ref <5.0)
Triglycerides: 85 mg/dL (ref <150)

## 2019-06-01 LAB — HEMOGLOBIN A1C
Hgb A1c MFr Bld: 5.5 % of total Hgb (ref <5.7)
Mean Plasma Glucose: 111 (calc)
eAG (mmol/L): 6.2 (calc)

## 2019-06-01 LAB — VITAMIN D 25 HYDROXY (VIT D DEFICIENCY, FRACTURES): Vit D, 25-Hydroxy: 37 ng/mL (ref 30–100)

## 2019-06-07 ENCOUNTER — Encounter: Payer: Self-pay | Admitting: Family Medicine

## 2019-06-07 ENCOUNTER — Other Ambulatory Visit: Payer: Self-pay

## 2019-06-07 ENCOUNTER — Ambulatory Visit (INDEPENDENT_AMBULATORY_CARE_PROVIDER_SITE_OTHER): Payer: Medicare Other | Admitting: Family Medicine

## 2019-06-07 ENCOUNTER — Ambulatory Visit (INDEPENDENT_AMBULATORY_CARE_PROVIDER_SITE_OTHER): Payer: Medicare Other

## 2019-06-07 VITALS — BP 136/80 | HR 92 | Resp 16 | Ht 64.0 in | Wt 185.0 lb

## 2019-06-07 VITALS — BP 136/80 | HR 92 | Ht 64.0 in | Wt 185.0 lb

## 2019-06-07 DIAGNOSIS — R7309 Other abnormal glucose: Secondary | ICD-10-CM

## 2019-06-07 DIAGNOSIS — Z952 Presence of prosthetic heart valve: Secondary | ICD-10-CM

## 2019-06-07 DIAGNOSIS — J3089 Other allergic rhinitis: Secondary | ICD-10-CM

## 2019-06-07 DIAGNOSIS — E669 Obesity, unspecified: Secondary | ICD-10-CM | POA: Diagnosis not present

## 2019-06-07 DIAGNOSIS — Z1211 Encounter for screening for malignant neoplasm of colon: Secondary | ICD-10-CM

## 2019-06-07 DIAGNOSIS — I482 Chronic atrial fibrillation, unspecified: Secondary | ICD-10-CM

## 2019-06-07 DIAGNOSIS — Z0000 Encounter for general adult medical examination without abnormal findings: Secondary | ICD-10-CM | POA: Diagnosis not present

## 2019-06-07 DIAGNOSIS — I272 Pulmonary hypertension, unspecified: Secondary | ICD-10-CM

## 2019-06-07 DIAGNOSIS — Z7901 Long term (current) use of anticoagulants: Secondary | ICD-10-CM

## 2019-06-07 DIAGNOSIS — E7800 Pure hypercholesterolemia, unspecified: Secondary | ICD-10-CM

## 2019-06-07 DIAGNOSIS — I10 Essential (primary) hypertension: Secondary | ICD-10-CM

## 2019-06-07 NOTE — Assessment & Plan Note (Signed)
Stable history of mild pulm HTN Followed by Cardiology, with MVR complication

## 2019-06-07 NOTE — Assessment & Plan Note (Signed)
Stable A1c at 5.5, below range of PreDM  Plan:  1. Not on any therapy currently - remain off 2. Encourage improved lifestyle - low carb, low sugar diet, limit sweet tea intake, reduce portion size, continue improving regular exercise 3. Follow-up 6 months labs A1c

## 2019-06-07 NOTE — Assessment & Plan Note (Signed)
Controlled HTN - Home BP readings stable  Complicated by chronic Atrial Fibrillation, MVR, Pulm HTN    Plan:  1. Continue current BP regimen Lisinopril 40mg daily, Metoprolol XL 100mg daily 2. Encourage improved lifestyle - low sodium diet, regular exercise 3. Continue monitor BP outside office, bring readings to next visit, if persistently >140/90 or new symptoms notify office sooner 

## 2019-06-07 NOTE — Assessment & Plan Note (Signed)
Improving weight loss Lifestyle

## 2019-06-07 NOTE — Assessment & Plan Note (Signed)
Stable Controlled on Cetirizine, Flonase

## 2019-06-07 NOTE — Assessment & Plan Note (Signed)
Stable AFib, without recurrence RVR. On anticoagulation Followed by [facility] on anticoagulation Coumadin clinic Continue rate control Metoprolol 

## 2019-06-07 NOTE — Progress Notes (Signed)
Virtual Visit via Telephone The purpose of this virtual visit is to provide medical care while limiting exposure to the novel coronavirus (COVID19) for both patient and office staff.  Consent was obtained for phone visit:  Yes.   Answered questions that patient had about telehealth interaction:  Yes.   I discussed the limitations, risks, security and privacy concerns of performing an evaluation and management service by telephone. I also discussed with the patient that there may be a patient responsible charge related to this service. The patient expressed understanding and agreed to proceed.  Patient Location: Home Provider Location: [facility])  ---------------------------------------------------------------------- Chief Complaint  Patient presents with  . Annual Exam    S: Reviewed CMA documentation. I have called patient and gathered additional HPI as follows:  Here for Annual Physical and Lab Review.  Elevated A1c/ Obesity BMI >31 - No prior history of Diabetes Improved A1c down to 5.5 on last lab Lifestyle: Improved weight - Diet: No particular diet. Reducing tea intake, working on improving water - Exercise: Trying to walk when she can, cannot if rains, still has some pain within R femur, has hardware prior surgery  CHRONIC HTN: Reportsno new concerns, some recent SBP readings 130s avg Current Meds -Lisinopril 40mg  daily, Metoprolol XL 100mg  daily Reports good compliance, took meds today. Tolerating well, w/o complaints. Denies CP, dyspnea, HA, edema, dizziness / lightheadedness  Allergic Rhinitis Controlled on Cetirizine / Flonase, has refills  Chronic Atrial Fibrillation, s/p Mitral Valve Replacement, chronic anticoagulation Pulmonary HTN / PAD - Followed by [facility] Cardiology Dr Stanford Breed, continues to f/u with [facility], she had ECHO on 11/12/17 with normal LVEF and mild mod pulm HTN, lasix 20mg  every other day was added.  Otherwise remains on anticoagulation with coumadin for s/p MVR, rate control for permanent AFib.  HYPERLIPIDEMIA: - Reports no concerns. Last lipid panel 05/2019, controlled  - Currently taking Atorvastatin 40mg , tolerating well without side effects or myalgias  Mild Anemia, normocytic Hemoglobin mild low at 11.5. Had episode of prolonged bleeding after dental extractions, otherwise doing well, no other bleeding concerns. Denies any fatigue or dyspnea or chest pain or symptoms.   Denies any high risk travel to areas of current concern for COVID19. Denies any known or suspected exposure to person with or possibly with COVID19.  Denies any fevers, chills, sweats, body ache, cough, shortness of breath, sinus pain or pressure, headache, abdominal pain, diarrhea  Health Maintenance  Due for colon CA screening, last done 1 year ago FOBT negative Due for initial pneumonia vaccine at age 2 - will return to receive Prevnar-13 soon within 1-2 week     Past Medical History:  Diagnosis Date  . Anticoagulated on warfarin   . Arthritis   . Chronic atrial fibrillation ([place])    SINCE 1997  . Dysrhythmia   . Goiter    multinodular  . Heart murmur   . History of hyperthyroidism    2003--  [place]  . Hypercholesterolemia   . Hyperlipidemia   . Hyperthyroidism    H/O  . Pulmonary hypertension, mild ([place])   . Rheumatic heart disease    CARDIOLOGIST-- DR Marcello Moores [place]  . S/P MVR (mitral valve replacement)    [place]  [facility] [place]--  SECONDARY TO SEVERE MV STENOSIS   Social History   Tobacco Use  . Smoking status: Never Smoker  . Smokeless tobacco: Never Used  Substance Use Topics  . Alcohol use: No  . Drug use: No  Current Outpatient Medications:  .  acyclovir (ZOVIRAX) 400 MG tablet, TAKE 1 TABLET BY MOUTH 3 TIMES DAILY FORFIVE TO TEN DAYS OR UNTIL HEALED. FOR FEVER BLISTER., Disp: 30 tablet, Rfl: 1 .  alendronate (FOSAMAX) 35 MG tablet, Take 1 tablet (35 mg total) by mouth every 7  (seven) days. Take with a full glass of water on an empty stomach., Disp: 12 tablet, Rfl: 3 .  amitriptyline (ELAVIL) 50 MG tablet, Take 50 mg by mouth at bedtime as needed for sleep. , Disp: , Rfl:  .  aspirin EC 81 MG tablet, Take 1 tablet (81 mg total) by mouth daily., Disp: 90 tablet, Rfl: 3 .  atorvastatin (LIPITOR) 40 MG tablet, TAKE 1 TABLET BY MOUTH ONCE EVERY EVENING, Disp: 90 tablet, Rfl: 3 .  calcium carbonate (OSCAL) 1500 (600 Ca) MG TABS tablet, Take 1 tablet by mouth daily. , Disp: , Rfl:  .  cetirizine (ZYRTEC) 10 MG tablet, Take 1 tablet (10 mg total) by mouth daily., Disp: 90 tablet, Rfl: 3 .  Cholecalciferol (VITAMIN D3) 2000 units capsule, Take 1 capsule (2,000 Units total) by mouth daily., Disp: , Rfl:  .  cycloSPORINE (RESTASIS) 0.05 % ophthalmic emulsion, Place 1 drop into both eyes 2 (two) times daily as needed (dry eyes)., Disp: , Rfl:  .  dicyclomine (BENTYL) 10 MG capsule, TAKE 1 CAPSULE BY MOUTH 4 TIMES A DAY BEFORE MEALS AND AT BEDTIME., Disp: 90 capsule, Rfl: 1 .  enoxaparin (LOVENOX) 80 MG/0.8ML injection, Inject 0.8 mLs (80 mg total) into the skin every 12 (twelve) hours., Disp: 16 mL, Rfl: 1 .  fluticasone (FLONASE) 50 MCG/ACT nasal spray, PLACE 2 SPRAYS INTO BOTH NOSTRILS DAILY, Disp: 16 g, Rfl: 11 .  furosemide (LASIX) 20 MG tablet, TAKE 1 TABLET BY MOUTH EVERY OTHER DAY, Disp: 45 tablet, Rfl: 3 .  ibuprofen (ADVIL,MOTRIN) 200 MG tablet, Take 200 mg by mouth 2 (two) times daily as needed for headache or moderate pain., Disp: , Rfl:  .  lisinopril (ZESTRIL) 40 MG tablet, TAKE 1 TABLET BY MOUTH ONCE A DAY, Disp: 90 tablet, Rfl: 3 .  metoprolol succinate (TOPROL-XL) 100 MG 24 hr tablet, TAKE 1 TABLET BY MOUTH ONCE A DAY **TAKEOR IMMEDIATELY FOLLOWING A MEAL**, Disp: 90 tablet, Rfl: 3 .  Multiple Vitamins-Minerals (CENTRUM SILVER ADULT 50+ PO), Take 1 tablet by mouth daily., Disp: , Rfl:  .  warfarin (COUMADIN) 5 MG tablet, TAKE 1 TABLET BY MOUTH DAILY OR AS DIRECTED BY  COUMADIN CLINIC, Disp: 90 tablet, Rfl: 1 .  gabapentin (NEURONTIN) 100 MG capsule, , Disp: , Rfl:  .  warfarin (COUMADIN) 5 MG tablet, TAKE 1 TABLET BY MOUTH ONCE DAILY OR AS DIRECTED BY COUMADIN CLINIC, Disp: 90 tablet, Rfl: 1  Depression screen [facility] 2/9 06/07/2019 12/13/2018 12/10/2018  Decreased Interest 0 0 0  Down, Depressed, Hopeless 0 0 0  PHQ - 2 Score 0 0 0  Altered sleeping - - -  Tired, decreased energy - - -  Change in appetite - - -  Feeling bad or failure about yourself  - - -  Trouble concentrating - - -  Moving slowly or fidgety/restless - - -  Suicidal thoughts - - -  PHQ-9 Score - - -  Difficult doing work/chores - - -  Some recent data might be hidden    GAD 7 : Generalized Anxiety Score 10/02/2015  Nervous, Anxious, on Edge 1  Control/stop worrying 0  Worry too much - different things 0  Trouble relaxing 2  Restless 2  Easily annoyed or irritable 0  Afraid - awful might happen 0  Total GAD 7 Score 5  Anxiety Difficulty Somewhat difficult    -------------------------------------------------------------------------- O: No physical exam performed due to remote telephone encounter.  Lab results reviewed.  Recent Results (from the past 2160 hour(s))  POCT INR     Status: Abnormal   Collection Time: 03/14/19  8:09 AM  Result Value Ref Range   INR 4.5 (A) 2.0 - 3.0  POCT INR     Status: None   Collection Time: 03/28/19  2:31 PM  Result Value Ref Range   INR 3.0 2.0 - 3.0  POCT INR     Status: Abnormal   Collection Time: 04/18/19  8:39 AM  Result Value Ref Range   INR 4.0 (A) 2.0 - 3.0  POCT INR     Status: Abnormal   Collection Time: 05/09/19 11:46 AM  Result Value Ref Range   INR 3.8 (A) 2.0 - 3.0  POCT INR     Status: None   Collection Time: 05/25/19  2:30 PM  Result Value Ref Range   INR 2.7 2.0 - 3.0  VITAMIN D 25 Hydroxy (Vit-D Deficiency, Fractures)     Status: None   Collection Time: 05/31/19  8:02 AM  Result Value Ref Range   Vit D,  25-Hydroxy 37 30 - 100 ng/mL    Comment: Vitamin D Status         25-OH Vitamin D: . Deficiency:                    <20 ng/mL Insufficiency:             20 - 29 ng/mL Optimal:                 > or = 30 ng/mL . For 25-OH Vitamin D testing on patients on  D2-supplementation and patients for whom quantitation  of D2 and D3 fractions is required, the Quest[facility]ureD(TM) 25-OH VIT D, (D2,D3), LC/MS/MS is recommended: order  code [phone] (patients >23yrs). See Note 1 . Note 1 . For additional information, please refer to  http://education.[facility].com/faq/FAQ199  (This link is being provided for informational/ educational purposes only.)   Lipid panel     Status: Abnormal   Collection Time: 05/31/19  8:02 AM  Result Value Ref Range   Cholesterol 126 <200 mg/dL   HDL 48 (L) > OR = 50 mg/dL   Triglycerides 85 <150 mg/dL   LDL Cholesterol (Calc) 61 mg/dL (calc)    Comment: Reference range: <100 . Desirable range <100 mg/dL for primary prevention;   <70 mg/dL for patients with CHD or diabetic patients  with > or = 2 CHD risk factors. Marland Kitchen LDL-C is now calculated using the Martin-Hopkins  calculation, which is a validated novel method providing  better accuracy than the Friedewald equation in the  estimation of LDL-C.  Cresenciano Genre et al. Annamaria Helling. [id number]): 2061-2068  (http://education.[facility].com/faq/FAQ164)    Total CHOL/HDL Ratio 2.6 <5.0 (calc)   Non-HDL Cholesterol (Calc) 78 <130 mg/dL (calc)    Comment: For patients with diabetes plus 1 major ASCVD risk  factor, treating to a non-HDL-C goal of <100 mg/dL  (LDL-C of <70 mg/dL) is considered a therapeutic  option.   COMPLETE METABOLIC PANEL WITH GFR     Status: None   Collection Time: 05/31/19  8:02 AM  Result Value Ref Range   Glucose, Bld 97 65 - 99  mg/dL    Comment: .            Fasting reference interval .    BUN 10 7 - 25 mg/dL   Creat 0.96 0.50 - 0.99 mg/dL    Comment: For patients >31 years of age, the  reference limit for Creatinine is approximately 13% higher for people identified as African-American. .    GFR, Est Non African American 62 > OR = 60 mL/min/1.98m2   GFR, Est African American 72 > OR = 60 mL/min/1.73m2   BUN/Creatinine Ratio NOT APPLICABLE 6 - 22 (calc)   Sodium 143 135 - 146 mmol/L   Potassium 4.1 3.5 - 5.3 mmol/L   Chloride 107 98 - 110 mmol/L   CO2 25 20 - 32 mmol/L   Calcium 9.0 8.6 - 10.4 mg/dL   Total Protein 7.1 6.1 - 8.1 g/dL   Albumin 3.7 3.6 - 5.1 g/dL   Globulin 3.4 1.9 - 3.7 g/dL (calc)   AG Ratio 1.1 1.0 - 2.5 (calc)   Total Bilirubin 0.6 0.2 - 1.2 mg/dL   Alkaline phosphatase (APISO) 93 37 - 153 U/L   AST 21 10 - 35 U/L   ALT 14 6 - 29 U/L  CBC with Differential/Platelet     Status: Abnormal   Collection Time: 05/31/19  8:02 AM  Result Value Ref Range   WBC 4.8 3.8 - 10.8 Thousand/uL   RBC 4.26 3.80 - 5.10 Million/uL   Hemoglobin 11.5 (L) 11.7 - 15.5 g/dL   HCT 36.5 35.0 - 45.0 %   MCV 85.7 80.0 - 100.0 fL   MCH 27.0 27.0 - 33.0 pg   MCHC 31.5 (L) 32.0 - 36.0 g/dL   RDW 13.7 11.0 - 15.0 %   Platelets 175 140 - 400 Thousand/uL   MPV 13.2 (H) 7.5 - 12.5 fL   Neutro Abs 3,101 1,500 - 7,800 cells/uL   Lymphs Abs 1,176 850 - 3,900 cells/uL   Absolute Monocytes 403 200 - 950 cells/uL   Eosinophils Absolute 91 15 - 500 cells/uL   Basophils Absolute 29 0 - 200 cells/uL   Neutrophils Relative % 64.6 %   Total Lymphocyte 24.5 %   Monocytes Relative 8.4 %   Eosinophils Relative 1.9 %   Basophils Relative 0.6 %  Hemoglobin A1c     Status: None   Collection Time: 05/31/19  8:02 AM  Result Value Ref Range   Hgb A1c MFr Bld 5.5 <5.7 % of total Hgb    Comment: For the purpose of screening for the presence of diabetes: . <5.7%       Consistent with the absence of diabetes 5.7-6.4%    Consistent with increased risk for diabetes             (prediabetes) > or =6.5%  Consistent with diabetes . This assay result is consistent with a decreased risk of  diabetes. . Currently, no consensus exists regarding use of hemoglobin A1c for diagnosis of diabetes in children. . According to American Diabetes Association (ADA) guidelines, hemoglobin A1c <7.0% represents optimal control in non-pregnant diabetic patients. Different metrics may apply to specific patient populations.  Standards of Medical Care in Diabetes(ADA). .    Mean Plasma Glucose 111 (calc)   eAG (mmol/L) 6.2 (calc)    -------------------------------------------------------------------------- A&P:  Problem List Items Addressed This Visit    S/P MVR (mitral valve replacement)   Pulmonary hypertension (HCC)    Stable history of mild pulm HTN Followed by Cardiology, with MVR  complication      Obesity (BMI 30.0-34.9)    Improving weight loss Lifestyle      HYPERCHOLESTEROLEMIA    Controlled cholesterol on statin and lifestyle Last lipid panel 05/2019  Plan: 1. Continue current meds - Atorvastatin 40mg  2. Encourage improved lifestyle - low carb/cholesterol, reduce portion size, continue improving regular exercise      Essential hypertension    Controlled HTN - Home BP readings stable  Complicated by chronic Atrial Fibrillation, MVR, Pulm HTN    Plan:  1. Continue current BP regimen Lisinopril 40mg  daily, Metoprolol XL 100mg  daily 2. Encourage improved lifestyle - low sodium diet, regular exercise 3. Continue monitor BP outside office, bring readings to next visit, if persistently >140/90 or new symptoms notify office sooner      Elevated hemoglobin A1c    Stable A1c at 5.5, below range of PreDM  Plan:  1. Not on any therapy currently - remain off 2. Encourage improved lifestyle - low carb, low sugar diet, limit sweet tea intake, reduce portion size, continue improving regular exercise 3. Follow-up 6 months labs A1c      Chronic atrial fibrillation (HCC)    Stable AFib, without recurrence RVR. On anticoagulation Followed by [facility] Cardiology on  anticoagulation Coumadin clinic Continue rate control Metoprolol      Anticoagulated on Coumadin   Allergic rhinitis    Stable Controlled on Cetirizine, Flonase       Other Visit Diagnoses    Annual physical exam    -  Primary   Screening for colon cancer       Relevant Orders   [facility] - Cologuard     Updated Health Maintenance information Reviewed recent lab results with patient Encouraged improvement to lifestyle with diet and exercise - Goal of weight loss  Due for routine colon cancer screening. Last FOBT 1 year ago negative - Discussion today about recommendations for either Colonoscopy or Cologuard screening, benefits and risks of screening, interested in Cologuard, understands that if positive then recommendation is for diagnostic colonoscopy to follow-up. - Ordered Cologuard today  No orders of the defined types were placed in this encounter.   Follow-up: - Return in 6 month follow-up HTN, AFib, PreDM A1c  Patient verbalizes understanding with the above medical recommendations including the limitation of remote medical advice.  Specific follow-up and call-back criteria were given for patient to follow-up or seek medical care more urgently if needed.   - Time spent in direct consultation with patient on phone: 18 minutes   Nobie Putnam, [facility] 06/07/2019, 8:19 AM

## 2019-06-07 NOTE — Progress Notes (Signed)
Subjective:   [name] is a 63 y.o. female who presents for Medicare Annual (Subsequent) preventive examination.  This visit is being conducted via phone call  - after an attmept to do on video chat - due to the COVID-19 pandemic. This patient has given me verbal consent via phone to conduct this visit, patient states they are participating from their home address. Some vital signs may be absent or patient reported.   Patient identification: identified by name, DOB, and current address.    Review of Systems:  Cardiac Risk Factors include: advanced age (>26men, >43 women);dyslipidemia;hypertension     Objective:     Vitals: BP 136/80   Pulse 92   Ht 5\' 4"  (1.626 m)   Wt 185 lb (83.9 kg)   BMI 31.76 kg/m   Body mass index is 31.76 kg/m.  Advanced Directives 06/07/2019 06/01/2018 08/17/2017 05/26/2017 01/23/2017 11/03/2016 10/29/2016  Does Patient Have a Medical Advance Directive? No No No Yes No No No  Does patient want to make changes to medical advance directive? - - - Yes (MAU/Ambulatory/Procedural Areas - Information given) - - -  Would patient like information on creating a medical advance directive? - No - Patient declined No - Patient declined - No - Patient declined No - Patient declined -  Pre-existing out of facility DNR order (yellow form or pink MOST form) - - - - - - -    Tobacco Social History   Tobacco Use  Smoking Status Never Smoker  Smokeless Tobacco Never Used     Counseling given: Not Answered   Clinical Intake:  Pre-visit preparation completed: Yes  Pain : No/denies pain     Nutritional Risks: None Diabetes: No  How often do you need to have someone help you when you read instructions, pamphlets, or other written materials from your doctor or pharmacy?: 1 - Never  Interpreter Needed?: No  Information entered by :: Deetya Drouillard,LPN  Past Medical History:  Diagnosis Date  . Anticoagulated on warfarin   . Arthritis   . Chronic atrial  fibrillation ([place])    SINCE 1997  . Dysrhythmia   . Goiter    multinodular  . Heart murmur   . History of hyperthyroidism    2003--  PTU [place]  . Hypercholesterolemia   . Hyperlipidemia   . Hyperthyroidism    H/O  . Pulmonary hypertension, mild ([place])   . Rheumatic heart disease    CARDIOLOGIST-- DR Marcello Moores [facility]  . S/P MVR (mitral valve replacement)    1997  [facility]--  SECONDARY TO SEVERE MV STENOSIS   Past Surgical History:  Procedure Laterality Date  . ANTERIOR CERVICAL DECOMP/DISCECTOMY FUSION  12-21-2001   C5 --  C6  . BREAST BIOPSY Left 11/03/2016   Procedure: BREAST BIOPSY WITH NEEDLE LOCALIZATION;  Surgeon: Robert Bellow, MD;  Location: [facility] ORS;  Service: General;  Laterality: Left;  . BREAST EXCISIONAL BIOPSY Left 11/03/2016   excision of calcs. benign  . CARDIAC CATHETERIZATION  12-28-2006  [facility]   NORMAL CORONARY ARTERIES  . CARPAL TUNNEL RELEASE Right 01-20-2007  . COLONOSCOPY  2015  . ERCP  08/15/2011   Procedure: ENDOSCOPIC RETROGRADE CHOLANGIOPANCREATOGRAPHY (ERCP);  Surgeon: Jeryl Columbia, MD;  Location: [facility] ENDOSCOPY;  Service: Endoscopy;  Laterality: N/A;  . ERCP     MULTIPLE--  INCLUDING STENTS, SPHINTEROTOMY'S  STONE REMOVAL   . FEMUR IM NAIL Right 04/26/2014   Procedure: INTRAMEDULLARY (IM) NAIL FEMORAL;  Surgeon: Alta Corning,  MD;  Location: [place];  Service: Orthopedics;  Laterality: Right;  . I&D EXTREMITY Right 02/07/2013   Procedure: IRRIGATION AND DEBRIDEMENT OF RIGHT LOWER LEG WITH PLACEMENT OF ACELL AND WOULND VAC;  Surgeon: Theodoro Kos, DO;  Location: [place];  Service: Plastics;  Laterality: Right;  . LAPAROSCOPIC CHOLECYSTECTOMY  04-08-2002  . MITRAL VALVE REPLACEMENT  1997   [facility] [place]  . ORIF ANKLE FRACTURE Right 11/05/2012   Procedure: OPEN REDUCTION INTERNAL FIXATION (ORIF) ANKLE FRACTURE only operating on right;  Surgeon: Alta Corning, MD;  Location: [place];  Service: Orthopedics;  Laterality: Right;  . ORIF RIGHT ULNAR  FX W/ ILIAC CREST BONE GRAFT  10-06-2008  . ORIF WRIST FRACTURE Right 04/26/2014   Procedure: OPEN REDUCTION INTERNAL FIXATION (ORIF) WRIST FRACTURE;  Surgeon: Alta Corning, MD;  Location: [place];  Service: Orthopedics;  Laterality: Right;  . TRANSTHORACIC ECHOCARDIOGRAM  06-29-2009  dr wall   normal lvsf/ ef 55-60%/  normal bileaflet mechanical mvr/ moderated dilated la/ moderate tr  . WRIST ARTHROSCOPY Right 12-10-2007   debridement and ulnar shortening osteotomy w/ plate   Family History  Problem Relation Age of Onset  . Hypothyroidism Sister   . Gallbladder disease Sister   . Coronary artery disease Other   . Diabetes Mother   . Hypertension Mother   . Heart disease Father   . Stroke Father   . Breast cancer Neg Hx    Social History   Socioeconomic History  . Marital status: Divorced    Spouse name: Not on file  . Number of children: 0  . Years of education: 51  . Highest education level: 12th grade  Occupational History  . Occupation: Control and instrumentation engineer    Comment: retired  Tobacco Use  . Smoking status: Never Smoker  . Smokeless tobacco: Never Used  Substance and Sexual Activity  . Alcohol use: No  . Drug use: No  . Sexual activity: Not on file  Other Topics Concern  . Not on file  Social History Narrative  . Not on file   Social Determinants of Health   Financial Resource Strain:   . Difficulty of Paying Living Expenses: Not on file  Food Insecurity:   . Worried About Charity fundraiser in the Last Year: Not on file  . Ran Out of Food in the Last Year: Not on file  Transportation Needs:   . Lack of Transportation (Medical): Not on file  . Lack of Transportation (Non-Medical): Not on file  Physical Activity:   . Days of Exercise per Week: Not on file  . Minutes of Exercise per Session: Not on file  Stress:   . Feeling of Stress : Not on file  Social Connections:   . Frequency of Communication with Friends and Family: Not on file  . Frequency of Social  Gatherings with Friends and Family: Not on file  . Attends Religious Services: Not on file  . Active Member of Clubs or Organizations: Not on file  . Attends Archivist Meetings: Not on file  . Marital Status: Not on file    Outpatient Encounter Medications as of 06/07/2019  Medication Sig  . acyclovir (ZOVIRAX) 400 MG tablet TAKE 1 TABLET BY MOUTH 3 TIMES DAILY FORFIVE TO TEN DAYS OR UNTIL HEALED. FOR FEVER BLISTER.  Marland Kitchen alendronate (FOSAMAX) 35 MG tablet Take 1 tablet (35 mg total) by mouth every 7 (seven) days. Take with a full glass of water on an empty  stomach.  Marland Kitchen amitriptyline (ELAVIL) 50 MG tablet Take 50 mg by mouth at bedtime as needed for sleep.   Marland Kitchen aspirin EC 81 MG tablet Take 1 tablet (81 mg total) by mouth daily.  Marland Kitchen atorvastatin (LIPITOR) 40 MG tablet TAKE 1 TABLET BY MOUTH ONCE EVERY EVENING  . calcium carbonate (OSCAL) 1500 (600 Ca) MG TABS tablet Take 1 tablet by mouth daily.   . cetirizine (ZYRTEC) 10 MG tablet Take 1 tablet (10 mg total) by mouth daily.  . Cholecalciferol (VITAMIN D3) 2000 units capsule Take 1 capsule (2,000 Units total) by mouth daily.  . cycloSPORINE (RESTASIS) 0.05 % ophthalmic emulsion Place 1 drop into both eyes 2 (two) times daily as needed (dry eyes).  Marland Kitchen dicyclomine (BENTYL) 10 MG capsule TAKE 1 CAPSULE BY MOUTH 4 TIMES A DAY BEFORE MEALS AND AT BEDTIME.  Marland Kitchen enoxaparin (LOVENOX) 80 MG/0.8ML injection Inject 0.8 mLs (80 mg total) into the skin every 12 (twelve) hours.  . fluticasone (FLONASE) 50 MCG/ACT nasal spray PLACE 2 SPRAYS INTO BOTH NOSTRILS DAILY  . furosemide (LASIX) 20 MG tablet TAKE 1 TABLET BY MOUTH EVERY OTHER DAY  . gabapentin (NEURONTIN) 100 MG capsule   . ibuprofen (ADVIL,MOTRIN) 200 MG tablet Take 200 mg by mouth 2 (two) times daily as needed for headache or moderate pain.  Marland Kitchen lisinopril (ZESTRIL) 40 MG tablet TAKE 1 TABLET BY MOUTH ONCE A DAY  . metoprolol succinate (TOPROL-XL) 100 MG 24 hr tablet TAKE 1 TABLET BY MOUTH ONCE A  DAY **TAKEOR IMMEDIATELY FOLLOWING A MEAL**  . Multiple Vitamins-Minerals (CENTRUM SILVER ADULT 50+ PO) Take 1 tablet by mouth daily.  Marland Kitchen warfarin (COUMADIN) 5 MG tablet TAKE 1 TABLET BY MOUTH DAILY OR AS DIRECTED BY COUMADIN CLINIC   No facility-administered encounter medications on file as of 06/07/2019.    Activities of Daily Living In your present state of health, do you have any difficulty performing the following activities: 06/07/2019 06/07/2019  Hearing? N N  Vision? N N  Difficulty concentrating or making decisions? N N  Walking or climbing stairs? N N  Dressing or bathing? N N  Doing errands, shopping? N N  Preparing Food and eating ? N -  Using the Toilet? N -  In the past six months, have you accidently leaked urine? N -  Do you have problems with loss of bowel control? N -  Managing your Medications? N -  Managing your Finances? N -  Housekeeping or managing your Housekeeping? N -  Some recent data might be hidden    Patient Care Team: Olin Hauser, DO as PCP - General (Family Medicine) Renato Shin, MD as Attending Physician (Internal Medicine) Bary Castilla, Forest Gleason, MD as Consulting Physician (General Surgery) Lucilla Lame, MD as Consulting Physician (Gastroenterology) Lelon Perla, MD as Consulting Physician (Cardiology)    Assessment:   This is a routine wellness examination for Christinamarie.  Exercise Activities and Dietary recommendations Current Exercise Habits: Home exercise routine, Type of exercise: walking(3000 steps), Frequency (Times/Week): 7, Intensity: Mild, Exercise limited by: None identified  Goals    . DIET - INCREASE WATER INTAKE     Recommend drinking at least 6-8 glasses of water a day     . Prevent falls     Recommend to remove any items from the home that may cause slips or trips.         Fall Risk: Fall Risk  06/07/2019 12/13/2018 12/10/2018 06/08/2018 06/01/2018  Falls in the past year? 0 0  0 0 1  Number falls in past  yr: 0 - - - 1  Injury with Fall? - - - - 0  Follow up Falls evaluation completed Falls evaluation completed Falls evaluation completed Falls evaluation completed Falls prevention discussed    FALL RISK PREVENTION PERTAINING TO THE HOME:  Any stairs in or around the home? No  If so, are there any without handrails? No   Home free of loose throw rugs in walkways, pet beds, electrical cords, etc? Yes  Adequate lighting in your home to reduce risk of falls? Yes   ASSISTIVE DEVICES UTILIZED TO PREVENT FALLS:  Life alert? No  Use of a cane, walker or w/c? Yes  cnae as needed Grab bars in the bathroom? Yes  Shower chair or bench in shower? No  Elevated toilet seat or a handicapped toilet? No   DME ORDERS:  DME order needed?  No   TIMED UP AND GO:  Unable to perform    Depression Screen PHQ 2/9 Scores 06/07/2019 12/13/2018 12/10/2018 06/08/2018  PHQ - 2 Score 0 0 0 0  PHQ- 9 Score - - - -     Cognitive Function     6CIT Screen 06/01/2018 05/26/2017  What Year? 0 points 0 points  What month? 0 points 0 points  What time? 0 points 0 points  Count back from 20 0 points 0 points  Months in reverse 0 points 0 points  Repeat phrase 0 points 2 points  Total Score 0 2    Immunization History  Administered Date(s) Administered  . Influenza, High Dose Seasonal PF 03/02/2019  . Influenza-Unspecified 03/17/2016, 03/30/2018  . Zoster 04/29/2016    Qualifies for Shingles Vaccine? Yes  Zostavax completed 2017. Due for Shingrix. Education has been provided regarding the importance of this vaccine. Pt has been advised to call insurance company to determine out of pocket expense. Advised may also receive vaccine at local pharmacy or Health Dept. Verbalized acceptance and understanding.  Tdap: up to date   Flu Vaccine: up to date   Pneumococcal Vaccine: Due for Pneumococcal vaccine.   Screening Tests Health Maintenance  Topic Date Due  . Fecal DNA (Cologuard)  08/15/2003  . PNA  vac Low Risk Adult (1 of 2 - PCV13) 08/14/2018  . MAMMOGRAM  11/07/2019  . DEXA SCAN  12/18/2019  . TETANUS/TDAP  04/26/2024  . INFLUENZA VACCINE  Completed  . Hepatitis C Screening  Completed  . HIV Screening  Completed    Cancer Screenings:  Colorectal Screening: cologuard ordered   Mammogram: Completed 11/06/2017  Bone Density: Completed 12/17/2017  Lung Cancer Screening: (Low Dose CT Chest recommended if Age 65-80 years, 30 pack-year currently smoking OR have quit w/in 15years.) does not qualify.    Additional Screening:  Hepatitis C Screening: does qualify; Completed 04/10/2016  Vision Screening: Recommended annual ophthalmology exams for early detection of glaucoma and other disorders of the eye. Is the patient up to date with their annual eye exam?  Yes  Who is the provider or what is the name of the office in which the pt attends annual eye exams? [facility]  Dental Screening: Recommended annual dental exams for proper oral hygiene  Community Resource Referral:  CRR required this visit?  No       Plan:  I have personally reviewed and addressed the Medicare Annual Wellness questionnaire and have noted the following in the patient's chart:  A. Medical and social history B. Use of alcohol, tobacco or  illicit drugs  C. Current medications and supplements D. Functional ability and status E.  Nutritional status F.  Physical activity G. Advance directives H. List of other physicians I.  Hospitalizations, surgeries, and ER visits in previous 12 months J.  [place] such as hearing and vision if needed, cognitive and depression L. Referrals and appointments   In addition, I have reviewed and discussed with patient certain preventive protocols, quality metrics, and best practice recommendations. A written personalized care plan for preventive services as well as general preventive health recommendations were provided to patient.  Signed,    Bevelyn Ngo, LPN  [id number] Nurse Health Advisor   Nurse Notes: none   .

## 2019-06-07 NOTE — Assessment & Plan Note (Signed)
Controlled cholesterol on statin and lifestyle Last lipid panel 05/2019  Plan: 1. Continue current meds - Atorvastatin 40mg  2. Encourage improved lifestyle - low carb/cholesterol, reduce portion size, continue improving regular exercise

## 2019-06-07 NOTE — Patient Instructions (Addendum)
Thank you for coming to the office today.  Please schedule and return for a NURSE ONLY VISIT for VACCINE - Approximately within 1-2 weeks - Need Prevnar-13 (initial Pneumonia Vaccine >age 65) - then 1 year due for Pneumovax-23 - 2nd final dose  Keep up good work overall  Let me know if need refills  Colon Cancer Screening: - For all adults age 33+ routine colon cancer screening is highly recommended.     - Recent guidelines from [place] recommend starting age of 44 - Early detection of colon cancer is important, because often there are no warning signs or symptoms, also if found early usually it can be cured. Late stage is hard to treat.  - If you are not interested in Colonoscopy screening (if done and normal you could be cleared for 5 to 10 years until next due), then Cologuard is an excellent alternative for screening test for Colon Cancer. It is highly sensitive for detecting DNA of colon cancer from even the earliest stages. Also, there is NO bowel prep required. - If Cologuard is NEGATIVE, then it is good for 3 years before next due - If Cologuard is POSITIVE, then it is strongly advised to get a Colonoscopy, which allows the GI doctor to locate the source of the cancer or polyp (even very early stage) and treat it by removing it. ------------------------- ORDERED TODAY  . Follow instructions to collect sample, you may call the company for any help or questions, 24/7 telephone support at [phone].   Please schedule a Follow-up Appointment to: Return in about 6 months (around 12/06/2019) for 6 month follow-up HTN, AFib, PreDM A1c.  If you have any other questions or concerns, please feel free to call the office or send a message through [place]. You may also schedule an earlier appointment if necessary.  Additionally, you may be receiving a survey about your experience at our office within a few days to 1 week by e-mail or mail. We value your  feedback.  Nobie Putnam, DO [place]

## 2019-06-07 NOTE — Patient Instructions (Signed)
[name] , Thank you for taking time to come for your Medicare Wellness Visit. I appreciate your ongoing commitment to your health goals. Please review the following plan we discussed and let me know if I can assist you in the future.   Screening recommendations/referrals: Colonoscopy: cologuard ordered  Mammogram: completed 11/06/2017 Bone Density: completed 12/17/2017 Recommended yearly ophthalmology/optometry visit for glaucoma screening and checkup Recommended yearly dental visit for hygiene and checkup  Vaccinations: Influenza vaccine: up to date Pneumococcal vaccine: due now  Tdap vaccine: up to date Shingles vaccine: shingrix eligible     Advanced directives: please pick up a copy of this information next time you are in the office   Conditions/risks identified: please call me if you need anything- [phone]. If it is urgent please call office directly   Next appointment: Follow up in one year for your annual wellness visit    Preventive Care 65 Years and Older, Female Preventive care refers to lifestyle choices and visits with your health care provider that can promote health and wellness. What does preventive care include?  A yearly physical exam. This is also called an annual well check.  Dental exams once or twice a year.  Routine eye exams. Ask your health care provider how often you should have your eyes checked.  Personal lifestyle choices, including:  Daily care of your teeth and gums.  Regular physical activity.  Eating a healthy diet.  Avoiding tobacco and drug use.  Limiting alcohol use.  Practicing safe sex.  Taking low-dose aspirin every day.  Taking vitamin and mineral supplements as recommended by your health care provider. What happens during an annual well check? The services and screenings done by your health care provider during your annual well check will depend on your age, overall health, lifestyle risk factors, and family history of  disease. Counseling  Your health care provider may ask you questions about your:  Alcohol use.  Tobacco use.  Drug use.  Emotional well-being.  Home and relationship well-being.  Sexual activity.  Eating habits.  History of falls.  Memory and ability to understand (cognition).  Work and work Statistician.  Reproductive health. Screening  You may have the following tests or measurements:  Height, weight, and BMI.  Blood pressure.  Lipid and cholesterol levels. These may be checked every 5 years, or more frequently if you are over 43 years old.  Skin check.  Lung cancer screening. You may have this screening every year starting at age 60 if you have a 30-pack-year history of smoking and currently smoke or have quit within the past 15 years.  Fecal occult blood test (FOBT) of the stool. You may have this test every year starting at age 65.  Flexible sigmoidoscopy or colonoscopy. You may have a sigmoidoscopy every 5 years or a colonoscopy every 10 years starting at age 77.  Hepatitis C blood test.  Hepatitis B blood test.  Sexually transmitted disease (STD) testing.  Diabetes screening. This is done by checking your blood sugar (glucose) after you have not eaten for a while (fasting). You may have this done every 1-3 years.  Bone density scan. This is done to screen for osteoporosis. You may have this done starting at age 80.  Mammogram. This may be done every 1-2 years. Talk to your health care provider about how often you should have regular mammograms. Talk with your health care provider about your test results, treatment options, and if necessary, the need for more tests. Vaccines  Your health care provider may recommend certain vaccines, such as:  Influenza vaccine. This is recommended every year.  Tetanus, diphtheria, and acellular pertussis (Tdap, Td) vaccine. You may need a Td booster every 10 years.  Zoster vaccine. You may need this after age  74.  Pneumococcal 13-valent conjugate (PCV13) vaccine. One dose is recommended after age 19.  Pneumococcal polysaccharide (PPSV23) vaccine. One dose is recommended after age 2. Talk to your health care provider about which screenings and vaccines you need and how often you need them. This information is not intended to replace advice given to you by your health care provider. Make sure you discuss any questions you have with your health care provider. Document Released: 07/06/2015 Document Revised: 02/27/2016 Document Reviewed: 04/10/2015 Elsevier Interactive Patient Education  2017 [employer] Prevention in the Home Falls can cause injuries. They can happen to people of all ages. There are many things you can do to make your home safe and to help prevent falls. What can I do on the outside of my home?  Regularly fix the edges of walkways and driveways and fix any cracks.  Remove anything that might make you trip as you walk through a door, such as a raised step or threshold.  Trim any bushes or trees on the path to your home.  Use bright outdoor lighting.  Clear any walking paths of anything that might make someone trip, such as rocks or tools.  Regularly check to see if handrails are loose or broken. Make sure that both sides of any steps have handrails.  Any raised decks and porches should have guardrails on the edges.  Have any leaves, snow, or ice cleared regularly.  Use sand or salt on walking paths during winter.  Clean up any spills in your garage right away. This includes oil or grease spills. What can I do in the bathroom?  Use night lights.  Install grab bars by the toilet and in the tub and shower. Do not use towel bars as grab bars.  Use non-skid mats or decals in the tub or shower.  If you need to sit down in the shower, use a plastic, non-slip stool.  Keep the floor dry. Clean up any water that spills on the floor as soon as it happens.  Remove  soap buildup in the tub or shower regularly.  Attach bath mats securely with double-sided non-slip rug tape.  Do not have throw rugs and other things on the floor that can make you trip. What can I do in the bedroom?  Use night lights.  Make sure that you have a light by your bed that is easy to reach.  Do not use any sheets or blankets that are too big for your bed. They should not hang down onto the floor.  Have a firm chair that has side arms. You can use this for support while you get dressed.  Do not have throw rugs and other things on the floor that can make you trip. What can I do in the kitchen?  Clean up any spills right away.  Avoid walking on wet floors.  Keep items that you use a lot in easy-to-reach places.  If you need to reach something above you, use a strong step stool that has a grab bar.  Keep electrical cords out of the way.  Do not use floor polish or wax that makes floors slippery. If you must use wax, use non-skid floor wax.  Do  not have throw rugs and other things on the floor that can make you trip. What can I do with my stairs?  Do not leave any items on the stairs.  Make sure that there are handrails on both sides of the stairs and use them. Fix handrails that are broken or loose. Make sure that handrails are as long as the stairways.  Check any carpeting to make sure that it is firmly attached to the stairs. Fix any carpet that is loose or worn.  Avoid having throw rugs at the top or bottom of the stairs. If you do have throw rugs, attach them to the floor with carpet tape.  Make sure that you have a light switch at the top of the stairs and the bottom of the stairs. If you do not have them, ask someone to add them for you. What else can I do to help prevent falls?  Wear shoes that:  Do not have high heels.  Have rubber bottoms.  Are comfortable and fit you well.  Are closed at the toe. Do not wear sandals.  If you use a  stepladder:  Make sure that it is fully opened. Do not climb a closed stepladder.  Make sure that both sides of the stepladder are locked into place.  Ask someone to hold it for you, if possible.  Clearly mark and make sure that you can see:  Any grab bars or handrails.  First and last steps.  Where the edge of each step is.  Use tools that help you move around (mobility aids) if they are needed. These include:  Canes.  Walkers.  Scooters.  Crutches.  Turn on the lights when you go into a dark area. Replace any light bulbs as soon as they burn out.  Set up your furniture so you have a clear path. Avoid moving your furniture around.  If any of your floors are uneven, fix them.  If there are any pets around you, be aware of where they are.  Review your medicines with your doctor. Some medicines can make you feel dizzy. This can increase your chance of falling. Ask your doctor what other things that you can do to help prevent falls. This information is not intended to replace advice given to you by your health care provider. Make sure you discuss any questions you have with your health care provider. Document Released: 04/05/2009 Document Revised: 11/15/2015 Document Reviewed: 07/14/2014 Elsevier Interactive Patient Education  2017 [employer]

## 2019-06-27 ENCOUNTER — Other Ambulatory Visit: Payer: Self-pay

## 2019-06-27 ENCOUNTER — Ambulatory Visit (INDEPENDENT_AMBULATORY_CARE_PROVIDER_SITE_OTHER): Payer: Medicare HMO

## 2019-06-27 DIAGNOSIS — I059 Rheumatic mitral valve disease, unspecified: Secondary | ICD-10-CM

## 2019-06-27 DIAGNOSIS — I4891 Unspecified atrial fibrillation: Secondary | ICD-10-CM | POA: Diagnosis not present

## 2019-06-27 DIAGNOSIS — Z9889 Other specified postprocedural states: Secondary | ICD-10-CM

## 2019-06-27 DIAGNOSIS — I482 Chronic atrial fibrillation, unspecified: Secondary | ICD-10-CM

## 2019-06-27 DIAGNOSIS — Z5181 Encounter for therapeutic drug level monitoring: Secondary | ICD-10-CM

## 2019-06-27 LAB — POCT INR: INR: 2.3 (ref 2.0–3.0)

## 2019-06-27 NOTE — Patient Instructions (Signed)
-   Take 2 tablets today & tomorrow, then resume dosage of 1/2 tablet every day, EXCEPT 1 TABLET on [place].  - Recheck in 2 weeks.

## 2019-07-11 ENCOUNTER — Ambulatory Visit (INDEPENDENT_AMBULATORY_CARE_PROVIDER_SITE_OTHER): Payer: Medicare HMO

## 2019-07-11 ENCOUNTER — Other Ambulatory Visit: Payer: Self-pay

## 2019-07-11 DIAGNOSIS — I482 Chronic atrial fibrillation, unspecified: Secondary | ICD-10-CM | POA: Diagnosis not present

## 2019-07-11 DIAGNOSIS — Z5181 Encounter for therapeutic drug level monitoring: Secondary | ICD-10-CM | POA: Diagnosis not present

## 2019-07-11 DIAGNOSIS — I059 Rheumatic mitral valve disease, unspecified: Secondary | ICD-10-CM | POA: Diagnosis not present

## 2019-07-11 DIAGNOSIS — I4891 Unspecified atrial fibrillation: Secondary | ICD-10-CM

## 2019-07-11 DIAGNOSIS — Z9889 Other specified postprocedural states: Secondary | ICD-10-CM

## 2019-07-11 LAB — POCT INR: INR: 2.9 (ref 2.0–3.0)

## 2019-07-11 NOTE — Patient Instructions (Signed)
-   Take 1 tablet tonight, then resume dosage of 1/2 tablet every day, EXCEPT 1 TABLET on SUNDAYS & WEDNESDAYS.  - Recheck in 3 weeks.

## 2019-08-01 ENCOUNTER — Other Ambulatory Visit: Payer: Self-pay

## 2019-08-01 ENCOUNTER — Ambulatory Visit (INDEPENDENT_AMBULATORY_CARE_PROVIDER_SITE_OTHER): Payer: Medicare HMO

## 2019-08-01 DIAGNOSIS — I059 Rheumatic mitral valve disease, unspecified: Secondary | ICD-10-CM

## 2019-08-01 DIAGNOSIS — I482 Chronic atrial fibrillation, unspecified: Secondary | ICD-10-CM | POA: Diagnosis not present

## 2019-08-01 DIAGNOSIS — Z5181 Encounter for therapeutic drug level monitoring: Secondary | ICD-10-CM

## 2019-08-01 DIAGNOSIS — I4891 Unspecified atrial fibrillation: Secondary | ICD-10-CM

## 2019-08-01 DIAGNOSIS — Z9889 Other specified postprocedural states: Secondary | ICD-10-CM | POA: Diagnosis not present

## 2019-08-01 LAB — POCT INR: INR: 3 (ref 2.0–3.0)

## 2019-08-01 NOTE — Patient Instructions (Signed)
-   Continue dosage of 1/2 tablet every day, EXCEPT 1 TABLET on SUNDAYS & WEDNESDAYS.  - Recheck in 4 weeks.

## 2019-08-06 ENCOUNTER — Other Ambulatory Visit: Payer: Self-pay | Admitting: Cardiology

## 2019-08-12 ENCOUNTER — Telehealth: Payer: Self-pay | Admitting: Family Medicine

## 2019-08-12 NOTE — Chronic Care Management (AMB) (Signed)
Chronic Care Management   Note  08/12/2019 Name: [name] MRN: [id number] DOB: 1955/09/18  [name] is a 64 y.o. year old female who is a primary care patient of Olin Hauser, DO. I reached out to [name] by phone today in response to a referral sent by Ms. Wendi Maya Gilland's health plan.     Ms. Treloar was given information about Chronic Care Management services today including:  1. CCM service includes personalized support from designated clinical staff supervised by her physician, including individualized plan of care and coordination with other care providers 2. 24/7 contact phone numbers for assistance for urgent and routine care needs. 3. Service will only be billed when office clinical staff spend 20 minutes or more in a month to coordinate care. 4. Only one practitioner may furnish and bill the service in a calendar month. 5. The patient may stop CCM services at any time (effective at the end of the month) by phone call to the office staff. 6. The patient will be responsible for cost sharing (co-pay) of up to 20% of the service fee (after annual deductible is met).  Patient agreed to services and verbal consent obtained.   Follow up plan: Telephone appointment with care management team member scheduled for:09/15/2019  Noreene Larsson, [place], [place], [place] [address] Direct Dial: [phone] [email or web address] Website: [email or web address]

## 2019-08-29 ENCOUNTER — Other Ambulatory Visit: Payer: Self-pay

## 2019-08-29 ENCOUNTER — Ambulatory Visit (INDEPENDENT_AMBULATORY_CARE_PROVIDER_SITE_OTHER): Payer: Medicare HMO

## 2019-08-29 DIAGNOSIS — Z5181 Encounter for therapeutic drug level monitoring: Secondary | ICD-10-CM

## 2019-08-29 DIAGNOSIS — Z9889 Other specified postprocedural states: Secondary | ICD-10-CM

## 2019-08-29 DIAGNOSIS — I482 Chronic atrial fibrillation, unspecified: Secondary | ICD-10-CM

## 2019-08-29 DIAGNOSIS — I059 Rheumatic mitral valve disease, unspecified: Secondary | ICD-10-CM | POA: Diagnosis not present

## 2019-08-29 DIAGNOSIS — I4891 Unspecified atrial fibrillation: Secondary | ICD-10-CM

## 2019-08-29 LAB — POCT INR: INR: 1.8 — AB (ref 2.0–3.0)

## 2019-08-29 NOTE — Patient Instructions (Signed)
-   take 1 whole tablet warfarin tonight and tomorrow - on Wednesday, resume dosage of 1/2 tablet every day, EXCEPT 1 TABLET on [place].  - Recheck in 3 weeks.

## 2019-09-15 ENCOUNTER — Ambulatory Visit: Payer: Self-pay | Admitting: General Practice

## 2019-09-15 ENCOUNTER — Telehealth: Payer: Self-pay

## 2019-09-15 NOTE — Chronic Care Management (AMB) (Signed)
  Chronic Care Management   Outreach Note  09/15/2019 Name: [name] MRN: [id number] DOB: 08-10-1955  Referred by: Olin Hauser, DO Reason for referral : Chronic Care Management (Initial outreach call)   An unsuccessful telephone outreach was attempted today. The patient was referred to the case management team for assistance with care management and care coordination.   Follow Up Plan: A HIPPA compliant phone message was left for the patient providing contact information and requesting a return call.   Noreene Larsson RN, MSN, [place] Mobile: [phone]

## 2019-09-19 ENCOUNTER — Ambulatory Visit (INDEPENDENT_AMBULATORY_CARE_PROVIDER_SITE_OTHER): Payer: Medicare HMO

## 2019-09-19 ENCOUNTER — Other Ambulatory Visit: Payer: Self-pay

## 2019-09-19 DIAGNOSIS — I4891 Unspecified atrial fibrillation: Secondary | ICD-10-CM | POA: Diagnosis not present

## 2019-09-19 DIAGNOSIS — Z5181 Encounter for therapeutic drug level monitoring: Secondary | ICD-10-CM

## 2019-09-19 DIAGNOSIS — I059 Rheumatic mitral valve disease, unspecified: Secondary | ICD-10-CM

## 2019-09-19 DIAGNOSIS — Z9889 Other specified postprocedural states: Secondary | ICD-10-CM

## 2019-09-19 DIAGNOSIS — I482 Chronic atrial fibrillation, unspecified: Secondary | ICD-10-CM | POA: Diagnosis not present

## 2019-09-19 LAB — POCT INR: INR: 1.8 — AB (ref 2.0–3.0)

## 2019-09-19 NOTE — Patient Instructions (Signed)
-   take 2 tablets warfarin tonight -tomorrow START NEW DOSAGE of 1/2 tablet every day, EXCEPT 1 TABLET on MONDAYS, [place]. - Recheck in 3 weeks.

## 2019-10-10 ENCOUNTER — Other Ambulatory Visit: Payer: Self-pay

## 2019-10-10 ENCOUNTER — Ambulatory Visit (INDEPENDENT_AMBULATORY_CARE_PROVIDER_SITE_OTHER): Payer: Medicare HMO

## 2019-10-10 DIAGNOSIS — I059 Rheumatic mitral valve disease, unspecified: Secondary | ICD-10-CM | POA: Diagnosis not present

## 2019-10-10 DIAGNOSIS — I4891 Unspecified atrial fibrillation: Secondary | ICD-10-CM

## 2019-10-10 DIAGNOSIS — I482 Chronic atrial fibrillation, unspecified: Secondary | ICD-10-CM

## 2019-10-10 DIAGNOSIS — Z9889 Other specified postprocedural states: Secondary | ICD-10-CM | POA: Diagnosis not present

## 2019-10-10 DIAGNOSIS — Z5181 Encounter for therapeutic drug level monitoring: Secondary | ICD-10-CM | POA: Diagnosis not present

## 2019-10-10 LAB — POCT INR: INR: 4.7 — AB (ref 2.0–3.0)

## 2019-10-10 NOTE — Patient Instructions (Signed)
-    Skip warfarin tonight, have a serving of greens today, - tomorrow resume warfarin dosage of 1/2 tablet every day, EXCEPT 1 TABLET on MONDAYS, [place]. - Recheck in 3 weeks.

## 2019-10-13 ENCOUNTER — Ambulatory Visit: Payer: Self-pay | Admitting: General Practice

## 2019-10-13 ENCOUNTER — Telehealth: Payer: Self-pay

## 2019-10-13 NOTE — Chronic Care Management (AMB) (Signed)
  Chronic Care Management   Outreach Note  10/13/2019 Name: [name] MRN: [id number] DOB: 08-26-1955  Referred by: Olin Hauser, DO Reason for referral : Chronic Care Management (2nd attempt: Initial outreach for Chronic Disease Management and Care coordination needs)   A second unsuccessful telephone outreach was attempted today. The patient was referred to the case management team for assistance with care management and care coordination.   Follow Up Plan: A HIPPA compliant phone message was left for the patient providing contact information and requesting a return call.   Noreene Larsson RN, MSN, [place] Mobile: [phone]

## 2019-10-31 ENCOUNTER — Other Ambulatory Visit: Payer: Self-pay

## 2019-10-31 ENCOUNTER — Ambulatory Visit (INDEPENDENT_AMBULATORY_CARE_PROVIDER_SITE_OTHER): Payer: Medicare HMO

## 2019-10-31 DIAGNOSIS — Z5181 Encounter for therapeutic drug level monitoring: Secondary | ICD-10-CM | POA: Diagnosis not present

## 2019-10-31 DIAGNOSIS — I4891 Unspecified atrial fibrillation: Secondary | ICD-10-CM

## 2019-10-31 DIAGNOSIS — Z9889 Other specified postprocedural states: Secondary | ICD-10-CM | POA: Diagnosis not present

## 2019-10-31 DIAGNOSIS — I059 Rheumatic mitral valve disease, unspecified: Secondary | ICD-10-CM

## 2019-10-31 DIAGNOSIS — I482 Chronic atrial fibrillation, unspecified: Secondary | ICD-10-CM | POA: Diagnosis not present

## 2019-10-31 LAB — POCT INR: INR: 2.8 (ref 2.0–3.0)

## 2019-10-31 NOTE — Patient Instructions (Signed)
-   continue warfarin dosage of 1/2 tablet every day, EXCEPT 1 TABLET on MONDAYS, [place]. - Don't have any greens for the next few days - Recheck in 3 weeks.

## 2019-11-07 ENCOUNTER — Ambulatory Visit (INDEPENDENT_AMBULATORY_CARE_PROVIDER_SITE_OTHER): Payer: Medicare HMO | Admitting: General Practice

## 2019-11-07 ENCOUNTER — Telehealth: Payer: Self-pay | Admitting: General Practice

## 2019-11-07 DIAGNOSIS — I10 Essential (primary) hypertension: Secondary | ICD-10-CM

## 2019-11-07 DIAGNOSIS — I482 Chronic atrial fibrillation, unspecified: Secondary | ICD-10-CM | POA: Diagnosis not present

## 2019-11-07 DIAGNOSIS — E7800 Pure hypercholesterolemia, unspecified: Secondary | ICD-10-CM

## 2019-11-07 NOTE — Chronic Care Management (AMB) (Signed)
Chronic Care Management   Initial Visit Note  11/07/2019 Name: [name] MRN: [id number] DOB: 02-Feb-1956  Referred by: Joanna Hauser, DO Reason for referral : Chronic Care Management (Initial: RNCM Chronic Disease Management and Care coordination needs. )   [name] is a 64 y.o. year old female who is a primary care patient of Joanna Hauser, DO. The CCM team was consulted for assistance with chronic disease management and care coordination needs related to Atrial Fibrillation, HTN and HLD  Review of patient status, including review of consultants reports, relevant laboratory and other test results, and collaboration with appropriate care team members and the patient's provider was performed as part of comprehensive patient evaluation and provision of chronic care management services.    SDOH (Social Determinants of Health) assessments performed: Yes See Care Plan activities for detailed interventions related to SDOH  SDOH Interventions     Most Recent Value  SDOH Interventions  SDOH Interventions for the Following Domains  Physical Activity, Social Connections  Physical Activity Interventions  Other (Comments) [does try to walk sometimes but does not do alot due to swelling in her legs]  Social Connections Interventions  Other (Comment) [good support system, denies any concerns at this time]       Medications: Outpatient Encounter Medications as of 11/07/2019  Medication Sig  . acyclovir (ZOVIRAX) 400 MG tablet TAKE 1 TABLET BY MOUTH 3 TIMES DAILY FORFIVE TO TEN DAYS OR UNTIL HEALED. FOR FEVER BLISTER.  Marland Kitchen alendronate (FOSAMAX) 35 MG tablet Take 1 tablet (35 mg total) by mouth every 7 (seven) days. Take with a full glass of water on an empty stomach.  Marland Kitchen amitriptyline (ELAVIL) 50 MG tablet Take 50 mg by mouth at bedtime as needed for sleep.   Marland Kitchen aspirin EC 81 MG tablet Take 1 tablet (81 mg total) by mouth daily.  Marland Kitchen atorvastatin (LIPITOR) 40 MG tablet  TAKE 1 TABLET BY MOUTH ONCE EVERY EVENING  . calcium carbonate (OSCAL) 1500 (600 Ca) MG TABS tablet Take 1 tablet by mouth daily.   . cetirizine (ZYRTEC) 10 MG tablet Take 1 tablet (10 mg total) by mouth daily.  . Cholecalciferol (VITAMIN D3) 2000 units capsule Take 1 capsule (2,000 Units total) by mouth daily.  . cycloSPORINE (RESTASIS) 0.05 % ophthalmic emulsion Place 1 drop into both eyes 2 (two) times daily as needed (dry eyes).  Marland Kitchen dicyclomine (BENTYL) 10 MG capsule TAKE 1 CAPSULE BY MOUTH 4 TIMES A DAY BEFORE MEALS AND AT BEDTIME.  Marland Kitchen enoxaparin (LOVENOX) 80 MG/0.8ML injection Inject 0.8 mLs (80 mg total) into the skin every 12 (twelve) hours. (Patient not taking: Reported on 11/07/2019)  . fluticasone (FLONASE) 50 MCG/ACT nasal spray PLACE 2 SPRAYS INTO BOTH NOSTRILS DAILY  . furosemide (LASIX) 20 MG tablet TAKE 1 TABLET BY MOUTH EVERY OTHER DAY  . gabapentin (NEURONTIN) 100 MG capsule   . ibuprofen (ADVIL,MOTRIN) 200 MG tablet Take 200 mg by mouth 2 (two) times daily as needed for headache or moderate pain.  Marland Kitchen lisinopril (ZESTRIL) 40 MG tablet TAKE 1 TABLET BY MOUTH ONCE A DAY  . metoprolol succinate (TOPROL-XL) 100 MG 24 hr tablet TAKE 1 TABLET BY MOUTH ONCE A DAY **TAKEOR IMMEDIATELY FOLLOWING A MEAL**  . Multiple Vitamins-Minerals (CENTRUM SILVER ADULT 50+ PO) Take 1 tablet by mouth daily.  Marland Kitchen warfarin (COUMADIN) 5 MG tablet TAKE 1 TABLET BY MOUTH ONCE A DAY OR AS DIRECTED BY COUMADIN CLINIC.   No facility-administered encounter medications on file as  of 11/07/2019.     Objective:  BP Readings from Last 3 Encounters:  06/07/19 136/80  06/07/19 136/80  01/26/19 126/78    Goals Addressed            This Visit's Progress   . RNCM: Pt- "I could do better with the salt in my diet" (pt-stated)       CARE PLAN ENTRY (see longtitudinal plan of care for additional care plan information)  Current Barriers:  . Chronic Disease Management support, education, and care coordination needs  related to Atrial Fibrillation, HTN, and HLD  Clinical Goal(s) related to Atrial Fibrillation, HTN, and HLD:  Over the next 120days, patient will:  . Work with the care management team to address educational, disease management, and care coordination needs  . Begin or continue self health monitoring activities as directed today Measure and record blood pressure 5 times per week and adhere to a heart healthy diet . Call provider office for new or worsened signs and symptoms Blood pressure findings outside established parameters, Chest pain, and New or worsened symptom related to AFIB/HLD and other chronic conditions  . Call care management team with questions or concerns . Verbalize basic understanding of patient centered plan of care established today  Interventions related to Atrial Fibrillation, HTN, and HLD:  . Evaluation of current treatment plans and patient's adherence to plan as established by provider . Assessed patient understanding of disease states . Assessed patient's education and care coordination needs.  The patient does not have an AD/LW- written information sent via mail to the patient today on AD/LW . Provided disease specific education to patient.  Discussed heart healthy diet- will send information via my Chart system and EMMI on DASH diet.  [name] with appropriate clinical care team members regarding patient needs.  Review of LCSW and pharmacist available for needs. Denies any needs at this time.   Patient Self Care Activities related to Atrial Fibrillation, HTN, and HLD:  . Patient is unable to independently self-manage chronic health conditions  Initial goal documentation         [name] was given information about Chronic Care Management services today including:  1. CCM service includes personalized support from designated clinical staff supervised by her physician, including individualized plan of care and coordination with other care providers 2. 24/7  contact phone numbers for assistance for urgent and routine care needs. 3. Service will only be billed when office clinical staff spend 20 minutes or more in a month to coordinate care. 4. Only one practitioner may furnish and bill the service in a calendar month. 5. The patient may stop CCM services at any time (effective at the end of the month) by phone call to the office staff. 6. The patient will be responsible for cost sharing (co-pay) of up to 20% of the service fee (after annual deductible is met).  Patient agreed to services and verbal consent obtained.   Plan:   The care management team will reach out to the patient again over the next 60 days.   Noreene Larsson RN, MSN, [place] Mobile: [phone]

## 2019-11-07 NOTE — Patient Instructions (Signed)
Visit Information  Goals Addressed            This Visit's Progress    RNCM: Pt- "I could do better with the salt in my diet" (pt-stated)       CARE PLAN ENTRY (see longtitudinal plan of care for additional care plan information)  Current Barriers:   Chronic Disease Management support, education, and care coordination needs related to Atrial Fibrillation, HTN, and HLD  Clinical Goal(s) related to Atrial Fibrillation, HTN, and HLD:  Over the next 120days, patient will:   Work with the care management team to address educational, disease management, and care coordination needs   Begin or continue self health monitoring activities as directed today Measure and record blood pressure 5 times per week and adhere to a heart healthy diet  Call provider office for new or worsened signs and symptoms Blood pressure findings outside established parameters, Chest pain, and New or worsened symptom related to AFIB/HLD and other chronic conditions   Call care management team with questions or concerns  Verbalize basic understanding of patient centered plan of care established today  Interventions related to Atrial Fibrillation, HTN, and HLD:   Evaluation of current treatment plans and patient's adherence to plan as established by provider  Assessed patient understanding of disease states  Assessed patient's education and care coordination needs.  The patient does not have an AD/LW- written information sent via mail to the patient today on AD/LW  Provided disease specific education to patient.  Discussed heart healthy diet- will send information via my Chart system and EMMI on DASH diet.   Collaborated with appropriate clinical care team members regarding patient needs.  Review of LCSW and pharmacist available for needs. Denies any needs at this time.   Patient Self Care Activities related to Atrial Fibrillation, HTN, and HLD:   Patient is unable to independently self-manage chronic health  conditions  Initial goal documentation        Ms. Collier was given information about Chronic Care Management services today including:  1. CCM service includes personalized support from designated clinical staff supervised by her physician, including individualized plan of care and coordination with other care providers 2. 24/7 contact phone numbers for assistance for urgent and routine care needs. 3. Service will only be billed when office clinical staff spend 20 minutes or more in a month to coordinate care. 4. Only one practitioner may furnish and bill the service in a calendar month. 5. The patient may stop CCM services at any time (effective at the end of the month) by phone call to the office staff. 6. The patient will be responsible for cost sharing (co-pay) of up to 20% of the service fee (after annual deductible is met).  Patient agreed to services and verbal consent obtained.   Patient verbalizes understanding of instructions provided today.   The care management team will reach out to the patient again over the next 60 days.   Noreene Larsson RN, MSN, [facility]: [phone]   DASH Eating Plan DASH stands for "Dietary Approaches to Stop Hypertension." The DASH eating plan is a healthy eating plan that has been shown to reduce high blood pressure (hypertension). It may also reduce your risk for type 2 diabetes, heart disease, and stroke. The DASH eating plan may also help with weight loss. What are tips for following this plan?  General guidelines  Avoid eating more than 2,300  mg (milligrams) of salt (sodium) a day. If you have hypertension, you may need to reduce your sodium intake to 1,500 mg a day.  Limit alcohol intake to no more than 1 drink a day for nonpregnant women and 2 drinks a day for men. One drink equals 12 oz of beer, 5 oz of wine, or 1 oz of hard liquor.  Work with  your health care provider to maintain a healthy body weight or to lose weight. Ask what an ideal weight is for you.  Get at least 30 minutes of exercise that causes your heart to beat faster (aerobic exercise) most days of the week. Activities may include walking, swimming, or biking.  Work with your health care provider or diet and nutrition specialist (dietitian) to adjust your eating plan to your individual calorie needs. Reading food labels   Check food labels for the amount of sodium per serving. Choose foods with less than 5 percent of the Daily Value of sodium. Generally, foods with less than 300 mg of sodium per serving fit into this eating plan.  To find whole grains, look for the word "whole" as the first word in the ingredient list. Shopping  Buy products labeled as "low-sodium" or "no salt added."  Buy fresh foods. Avoid canned foods and premade or frozen meals. Cooking  Avoid adding salt when cooking. Use salt-free seasonings or herbs instead of table salt or sea salt. Check with your health care provider or pharmacist before using salt substitutes.  Do not fry foods. Cook foods using healthy methods such as baking, boiling, grilling, and broiling instead.  Cook with heart-healthy oils, such as olive, canola, soybean, or sunflower oil. Meal planning  Eat a balanced diet that includes: ? 5 or more servings of fruits and vegetables each day. At each meal, try to fill half of your plate with fruits and vegetables. ? Up to 6-8 servings of whole grains each day. ? Less than 6 oz of lean meat, poultry, or fish each day. A 3-oz serving of meat is about the same size as a deck of cards. One egg equals 1 oz. ? 2 servings of low-fat dairy each day. ? A serving of nuts, seeds, or beans 5 times each week. ? Heart-healthy fats. Healthy fats called Omega-3 fatty acids are found in foods such as flaxseeds and coldwater fish, like sardines, salmon, and mackerel.  Limit how much you eat  of the following: ? Canned or prepackaged foods. ? Food that is high in trans fat, such as fried foods. ? Food that is high in saturated fat, such as fatty meat. ? Sweets, desserts, sugary drinks, and other foods with added sugar. ? Full-fat dairy products.  Do not salt foods before eating.  Try to eat at least 2 vegetarian meals each week.  Eat more home-cooked food and less restaurant, buffet, and fast food.  When eating at a restaurant, ask that your food be prepared with less salt or no salt, if possible. What foods are recommended? The items listed may not be a complete list. Talk with your dietitian about what dietary choices are best for you. Grains Whole-grain or whole-wheat bread. Whole-grain or whole-wheat pasta. Brown rice. [place]. Bulgur. Whole-grain and low-sodium cereals. Pita bread. Low-fat, low-sodium crackers. Whole-wheat flour tortillas. Vegetables Fresh or frozen vegetables (raw, steamed, roasted, or grilled). Low-sodium or reduced-sodium tomato and vegetable juice. Low-sodium or reduced-sodium tomato sauce and tomato paste. Low-sodium or reduced-sodium canned vegetables. Fruits All fresh, dried, or frozen  fruit. Canned fruit in natural juice (without added sugar). Meat and other protein foods Skinless chicken or [place]. Ground chicken or [place]. Pork with fat trimmed off. Fish and seafood. Egg whites. Dried beans, peas, or lentils. Unsalted nuts, nut butters, and seeds. Unsalted canned beans. Lean cuts of beef with fat trimmed off. Low-sodium, lean deli meat. Dairy Low-fat (1%) or fat-free (skim) milk. Fat-free, low-fat, or reduced-fat cheeses. Nonfat, low-sodium ricotta or cottage cheese. Low-fat or nonfat yogurt. Low-fat, low-sodium cheese. Fats and oils Soft margarine without trans fats. Vegetable oil. Low-fat, reduced-fat, or light mayonnaise and salad dressings (reduced-sodium). Canola, safflower, olive, soybean, and sunflower oils. Avocado. Seasoning and  other foods Herbs. Spices. Seasoning mixes without salt. Unsalted popcorn and pretzels. Fat-free sweets. What foods are not recommended? The items listed may not be a complete list. Talk with your dietitian about what dietary choices are best for you. Grains Baked goods made with fat, such as croissants, muffins, or some breads. Dry pasta or rice meal packs. Vegetables Creamed or fried vegetables. Vegetables in a cheese sauce. Regular canned vegetables (not low-sodium or reduced-sodium). Regular canned tomato sauce and paste (not low-sodium or reduced-sodium). Regular tomato and vegetable juice (not low-sodium or reduced-sodium). Angie Fava. Olives. Fruits Canned fruit in a light or heavy syrup. Fried fruit. Fruit in cream or butter sauce. Meat and other protein foods Fatty cuts of meat. Ribs. Fried meat. Berniece Salines. Sausage. Bologna and other processed lunch meats. Salami. Fatback. Hotdogs. Bratwurst. Salted nuts and seeds. Canned beans with added salt. Canned or smoked fish. Whole eggs or egg yolks. Chicken or [place] with skin. Dairy Whole or 2% milk, cream, and half-and-half. Whole or full-fat cream cheese. Whole-fat or sweetened yogurt. Full-fat cheese. Nondairy creamers. Whipped toppings. Processed cheese and cheese spreads. Fats and oils Butter. Stick margarine. Lard. Shortening. Ghee. Bacon fat. Tropical oils, such as coconut, palm kernel, or palm oil. Seasoning and other foods Salted popcorn and pretzels. Onion salt, garlic salt, seasoned salt, table salt, and sea salt. Worcestershire sauce. Tartar sauce. Barbecue sauce. Teriyaki sauce. Soy sauce, including reduced-sodium. Steak sauce. Canned and packaged gravies. Fish sauce. Oyster sauce. Cocktail sauce. Horseradish that you find on the shelf. Ketchup. Mustard. Meat flavorings and tenderizers. Bouillon cubes. Hot sauce and Tabasco sauce. Premade or packaged marinades. Premade or packaged taco seasonings. Relishes. Regular salad dressings. Where to  find more information:  National Heart, Lung, and [place]: https://wilson-eaton.com/  American Heart Association: www.heart.org Summary  The DASH eating plan is a healthy eating plan that has been shown to reduce high blood pressure (hypertension). It may also reduce your risk for type 2 diabetes, heart disease, and stroke.  With the DASH eating plan, you should limit salt (sodium) intake to 2,300 mg a day. If you have hypertension, you may need to reduce your sodium intake to 1,500 mg a day.  When on the DASH eating plan, aim to eat more fresh fruits and vegetables, whole grains, lean proteins, low-fat dairy, and heart-healthy fats.  Work with your health care provider or diet and nutrition specialist (dietitian) to adjust your eating plan to your individual calorie needs. This information is not intended to replace advice given to you by your health care provider. Make sure you discuss any questions you have with your health care provider. Document Revised: 05/22/2017 Document Reviewed: 06/02/2016 Elsevier Patient Education  2020 [employer].  Advance Directive  Advance directives are legal documents that let you make choices ahead of time about your health care and medical treatment  in case you become unable to communicate for yourself. Advance directives are a way for you to make known your wishes to family, friends, and health care providers. This can let others know about your end-of-life care if you become unable to communicate. Discussing and writing advance directives should happen over time rather than all at once. Advance directives can be changed depending on your situation and what you want, even after you have signed the advance directives. There are different types of advance directives, such as:  Medical power of attorney.  Living will.  Do not resuscitate (DNR) or do not attempt resuscitation (DNAR) order. Health care proxy and medical power of attorney A health care  proxy is also called a health care agent. This is a person who is appointed to make medical decisions for you in cases where you are unable to make the decisions yourself. Generally, people choose someone they know well and trust to represent their preferences. Make sure to ask this person for an agreement to act as your proxy. A proxy may have to exercise judgment in the event of a medical decision for which your wishes are not known. A medical power of attorney is a legal document that names your health care proxy. Depending on the laws in your state, after the document is written, it may also need to be:  Signed.  Notarized.  Dated.  Copied.  Witnessed.  Incorporated into your medical record. You may also want to appoint someone to manage your money in a situation in which you are unable to do so. This is called a durable power of attorney for finances. It is a separate legal document from the durable power of attorney for health care. You may choose the same person or someone different from your health care proxy to act as your agent in money matters. If you do not appoint a proxy, or if there is a concern that the proxy is not acting in your best interests, a court may appoint a guardian to act on your behalf. Living will A living will is a set of instructions that state your wishes about medical care when you cannot express them yourself. Health care providers should keep a copy of your living will in your medical record. You may want to give a copy to family members or friends. To alert caregivers in case of an emergency, you can place a card in your wallet to let them know that you have a living will and where they can find it. A living will is used if you become:  Terminally ill.  Disabled.  Unable to communicate or make decisions. Items to consider in your living will include:  To use or not to use life-support equipment, such as dialysis machines and breathing machines  (ventilators).  A DNR or DNAR order. This tells health care providers not to use cardiopulmonary resuscitation (CPR) if breathing or heartbeat stops.  To use or not to use tube feeding.  To be given or not to be given food and fluids.  Comfort (palliative) care when the goal becomes comfort rather than a cure.  Donation of organs and tissues. A living will does not give instructions for distributing your money and property if you should pass away. DNR or DNAR A DNR or DNAR order is a request not to have CPR in the event that your heart stops beating or you stop breathing. If a DNR or DNAR order has not been made and shared, a health  care provider will try to help any patient whose heart has stopped or who has stopped breathing. If you plan to have surgery, talk with your health care provider about how your DNR or DNAR order will be followed if problems occur. What if I do not have an advance directive? If you do not have an advance directive, some states assign family decision makers to act on your behalf based on how closely you are related to them. Each state has its own laws about advance directives. You may want to check with your health care provider, attorney, or state representative about the laws in your state. Summary  Advance directives are the legal documents that allow you to make choices ahead of time about your health care and medical treatment in case you become unable to tell others about your care.  The process of discussing and writing advance directives should happen over time. You can change the advance directives, even after you have signed them.  Advance directives include DNR or DNAR orders, living wills, and designating an agent as your medical power of attorney. This information is not intended to replace advice given to you by your health care provider. Make sure you discuss any questions you have with your health care provider. Document Revised: 01/06/2019 Document  Reviewed: 01/06/2019 Elsevier Patient Education  [place].

## 2019-11-16 ENCOUNTER — Other Ambulatory Visit: Payer: Self-pay

## 2019-11-16 ENCOUNTER — Ambulatory Visit (INDEPENDENT_AMBULATORY_CARE_PROVIDER_SITE_OTHER): Payer: Medicare HMO

## 2019-11-16 DIAGNOSIS — Z9889 Other specified postprocedural states: Secondary | ICD-10-CM

## 2019-11-16 DIAGNOSIS — Z5181 Encounter for therapeutic drug level monitoring: Secondary | ICD-10-CM | POA: Diagnosis not present

## 2019-11-16 DIAGNOSIS — I4891 Unspecified atrial fibrillation: Secondary | ICD-10-CM | POA: Diagnosis not present

## 2019-11-16 DIAGNOSIS — I482 Chronic atrial fibrillation, unspecified: Secondary | ICD-10-CM | POA: Diagnosis not present

## 2019-11-16 DIAGNOSIS — I059 Rheumatic mitral valve disease, unspecified: Secondary | ICD-10-CM | POA: Diagnosis not present

## 2019-11-16 LAB — POCT INR: INR: 2.3 (ref 2.0–3.0)

## 2019-11-16 NOTE — Patient Instructions (Signed)
-   take 1.5 tablets warfarin tonight, 1 tablet tomorrow, then  - on Friday, resume warfarin dosage of 1/2 tablet every day, EXCEPT 1 TABLET on MONDAYS, [place]. - Don't have any greens for the next few days - Recheck in 3 weeks.

## 2019-11-28 ENCOUNTER — Telehealth: Payer: Self-pay

## 2019-11-28 NOTE — Telephone Encounter (Signed)
We can review specifically at her upcoming apt 12/01/19  Nobie Putnam, [facility] 11/28/2019, 3:31 PM

## 2019-11-28 NOTE — Telephone Encounter (Signed)
Copied from [place] [phone]. Topic: Quick Communication - See Telephone Encounter >> Nov 28, 2019 10:57 AM [place] wrote: CRM for notification. See Telephone encounter for: 11/28/19. Pt needs a referral for mammogram, tried to make herself, states sorry to bother...from discussion re:CPE in December. To: [facility]

## 2019-11-28 NOTE — Telephone Encounter (Signed)
Patient is due for 6 month check up Apt scheduled for 12/01/2019 around 9:40 pm.

## 2019-12-01 ENCOUNTER — Encounter: Payer: Self-pay | Admitting: Family Medicine

## 2019-12-01 ENCOUNTER — Ambulatory Visit (INDEPENDENT_AMBULATORY_CARE_PROVIDER_SITE_OTHER): Payer: Medicare HMO | Admitting: Family Medicine

## 2019-12-01 ENCOUNTER — Other Ambulatory Visit: Payer: Self-pay | Admitting: Family Medicine

## 2019-12-01 ENCOUNTER — Other Ambulatory Visit: Payer: Self-pay

## 2019-12-01 VITALS — BP 134/77 | HR 83 | Temp 97.5°F | Resp 16 | Ht 64.0 in | Wt 179.6 lb

## 2019-12-01 DIAGNOSIS — M7712 Lateral epicondylitis, left elbow: Secondary | ICD-10-CM | POA: Diagnosis not present

## 2019-12-01 DIAGNOSIS — I10 Essential (primary) hypertension: Secondary | ICD-10-CM | POA: Diagnosis not present

## 2019-12-01 DIAGNOSIS — R7309 Other abnormal glucose: Secondary | ICD-10-CM | POA: Diagnosis not present

## 2019-12-01 DIAGNOSIS — Z1231 Encounter for screening mammogram for malignant neoplasm of breast: Secondary | ICD-10-CM

## 2019-12-01 DIAGNOSIS — R920 Mammographic microcalcification found on diagnostic imaging of breast: Secondary | ICD-10-CM | POA: Diagnosis not present

## 2019-12-01 DIAGNOSIS — I272 Pulmonary hypertension, unspecified: Secondary | ICD-10-CM

## 2019-12-01 DIAGNOSIS — R921 Mammographic calcification found on diagnostic imaging of breast: Secondary | ICD-10-CM

## 2019-12-01 DIAGNOSIS — Z1211 Encounter for screening for malignant neoplasm of colon: Secondary | ICD-10-CM | POA: Diagnosis not present

## 2019-12-01 DIAGNOSIS — E559 Vitamin D deficiency, unspecified: Secondary | ICD-10-CM

## 2019-12-01 DIAGNOSIS — I482 Chronic atrial fibrillation, unspecified: Secondary | ICD-10-CM

## 2019-12-01 DIAGNOSIS — E7800 Pure hypercholesterolemia, unspecified: Secondary | ICD-10-CM

## 2019-12-01 DIAGNOSIS — E669 Obesity, unspecified: Secondary | ICD-10-CM

## 2019-12-01 DIAGNOSIS — Z0000 Encounter for general adult medical examination without abnormal findings: Secondary | ICD-10-CM

## 2019-12-01 NOTE — Patient Instructions (Addendum)
Thank you for coming to the office today.   Follow instructions to collect sample, you may call the company for any help or questions, 24/7 telephone support at [phone].  Call them to confirm that they received the order and have the correct shipping address.  Mammogram was ordered Last done 2019. Prior breast biopsy 2018  For Mammogram screening for breast cancer   Call the [facility] below anytime to schedule your own appointment now that order has been placed.  [facility] [address], [place] [address] Phone: [phone]  -----------  You most likely have "Tennis Elbow" or "Lateral Epicondylitis" - This is inflammation or tendonitis affecting the muscles of your forearm - Usually it is caused by frequent or daily repetitive activities using your arms, lifting, rotating, pulling, twisting - it can happen over days to months or longer from repetitive strain  One of the most important parts of treatment is rest and avoiding the activities that make it worse.  Recommend TOPICAL Voltaren gel 1-3 times a day as needed  - It is safe to take Tylenol Ext Str 500mg  tabs - take 1 to 2 (max dose 1000mg ) every 6 hours as needed for breakthrough pain, max 24 hour daily dose is 6 to 8 tablets or 4000mg   Use RICE therapy: - R - Rest / relative rest with activity modification avoid overuse of joint - I - Ice packs (make sure you use a towel or sock / something to protect skin) - C - Compression with ACE wrap to apply pressure and reduce swelling allowing more support  Try a Forearm Tennis Elbow Strap over muscle as demonstrated - use with repetitive activity to reduce strain of muscle    - E - Elevation - if significant swelling, lift leg above heart level (toes above your nose) to help reduce swelling, most helpful at night after day of being on your feet  May consider Physical Therapy referral if  interested  ----------------------------  DUE for FASTING BLOOD WORK (no food or drink after midnight before the lab appointment, only water or coffee without cream/sugar on the morning of)  SCHEDULE "Lab Only" visit in the morning at the clinic for lab draw in 6 MONTHS   - Make sure Lab Only appointment is at about 1 week before your next appointment, so that results will be available  For Lab Results, once available within 2-3 days of blood draw, you can can log in to MyChart online to view your results and a brief explanation. Also, we can discuss results at next follow-up visit.    Please schedule a Follow-up Appointment to: Return in about 6 months (around 06/01/2020) for Annual Physical.  If you have any other questions or concerns, please feel free to call the office or send a message through [place]. You may also schedule an earlier appointment if necessary.  Additionally, you may be receiving a survey about your experience at our office within a few days to 1 week by e-mail or mail. We value your feedback.  Nobie Putnam, DO [place]

## 2019-12-01 NOTE — Assessment & Plan Note (Signed)
Controlled HTN - Home BP readings stable  Complicated by chronic Atrial Fibrillation, MVR, Pulm HTN    Plan:  1. Continue current BP regimen Lisinopril 40mg  daily, Metoprolol XL 100mg  daily 2. Encourage improved lifestyle - low sodium diet, regular exercise 3. Continue monitor BP outside office, bring readings to next visit, if persistently >140/90 or new symptoms notify office sooner

## 2019-12-01 NOTE — Progress Notes (Signed)
Subjective:    Patient ID: [name], female    DOB: 05/15/1956, 64 y.o.   MRN: [id number]  [name] is a 64 y.o. female presenting on 12/01/2019 for Hypertension   HPI   Left Elbow pain / Tendonitis Reports symptoms onset within past few weeks with cooler temperature change Taking Tylenol Ext STr 500mg  1-2 times a day PRN some relief. Cannot take NSAIDs. Tried Deep Blue topical PRN  Elevated A1c/ Obesity BMI >30 - No prior history of Diabetes Improved A1c down to 5.5 on last lab 6 months ago. No lab recently. Lifestyle: Improved weight - Diet:No particular diet. Reducing tea intake, working on improving water - Exercise:Trying to walk when she can, cannot if rains,still has some pain within R femur, has hardware prior surgery  CHRONIC HTN: Reportsno new concerns, reviewed readings. Current Meds -Lisinopril 40mg  daily, Metoprolol XL 100mg  daily Reports good compliance, took meds today. Tolerating well, w/o complaints. Denies CP, dyspnea, HA, edema, dizziness / lightheadedness  Mild Anemia, normocytic Hemoglobin mild low at 11.5. Had episode of prolonged bleeding after dental extractions, otherwise doing well, no other bleeding concerns. Denies any fatigue or dyspnea or chest pain or symptoms.   Denies any fevers, chills, sweats, body ache, cough, shortness of breath, sinus pain or pressure, headache, abdominal pain, diarrhea  Health Maintenance  Mammogram due. Previously w/ Dr Bary Castilla gen surg, for abnormal calcification on mammogram in 2018 had biopsy, negative mammo since. Due re order now. No history of breast cancer or fam history breast cancer.  Due for colon CA screening, last done 1 year ago FOBT negative. Re order Cologuard, never received.  Future defer PNA vaccine. She declines.    Depression screen [facility] 2/9 12/01/2019 11/07/2019 06/07/2019  Decreased Interest 0 0 0  Down, Depressed, Hopeless 0 0 0  PHQ - 2 Score 0 0 0  Altered  sleeping - - -  Tired, decreased energy - - -  Change in appetite - - -  Feeling bad or failure about yourself  - - -  Trouble concentrating - - -  Moving slowly or fidgety/restless - - -  Suicidal thoughts - - -  PHQ-9 Score - - -  Difficult doing work/chores - - -  Some recent data might be hidden    Social History   Tobacco Use  . Smoking status: Never Smoker  . Smokeless tobacco: Never Used  Vaping Use  . Vaping Use: Never used  Substance Use Topics  . Alcohol use: No  . Drug use: No   Past Surgical History:  Procedure Laterality Date  . ANTERIOR CERVICAL DECOMP/DISCECTOMY FUSION  12-21-2001   C5 --  C6  . BREAST BIOPSY Left 11/03/2016   Procedure: BREAST BIOPSY WITH NEEDLE LOCALIZATION;  Surgeon: Robert Bellow, MD;  Location: [facility] ORS;  Service: General;  Laterality: Left;  . BREAST EXCISIONAL BIOPSY Left 11/03/2016   excision of calcs. benign  . CARDIAC CATHETERIZATION  12-28-2006  [facility]   NORMAL CORONARY ARTERIES  . CARPAL TUNNEL RELEASE Right 01-20-2007  . COLONOSCOPY  2015  . ERCP  08/15/2011   Procedure: ENDOSCOPIC RETROGRADE CHOLANGIOPANCREATOGRAPHY (ERCP);  Surgeon: Jeryl Columbia, MD;  Location: [facility] ENDOSCOPY;  Service: Endoscopy;  Laterality: N/A;  . ERCP     MULTIPLE--  INCLUDING STENTS, SPHINTEROTOMY'S  STONE REMOVAL   . FEMUR IM NAIL Right 04/26/2014   Procedure: INTRAMEDULLARY (IM) NAIL FEMORAL;  Surgeon: Alta Corning, MD;  Location: [place];  Service: Orthopedics;  Laterality: Right;  . I & D EXTREMITY Right 02/07/2013   Procedure: IRRIGATION AND DEBRIDEMENT OF RIGHT LOWER LEG WITH PLACEMENT OF ACELL AND WOULND VAC;  Surgeon: Theodoro Kos, DO;  Location: [place];  Service: Plastics;  Laterality: Right;  . LAPAROSCOPIC CHOLECYSTECTOMY  04-08-2002  . MITRAL VALVE REPLACEMENT  1997   [facility]  . ORIF ANKLE FRACTURE Right 11/05/2012   Procedure: OPEN REDUCTION INTERNAL FIXATION (ORIF) ANKLE FRACTURE only operating on right;  Surgeon:  Alta Corning, MD;  Location: [place];  Service: Orthopedics;  Laterality: Right;  . ORIF RIGHT ULNAR FX W/ ILIAC CREST BONE GRAFT  10-06-2008  . ORIF WRIST FRACTURE Right 04/26/2014   Procedure: OPEN REDUCTION INTERNAL FIXATION (ORIF) WRIST FRACTURE;  Surgeon: Alta Corning, MD;  Location: [place];  Service: Orthopedics;  Laterality: Right;  . TRANSTHORACIC ECHOCARDIOGRAM  06-29-2009  dr wall   normal lvsf/ ef 55-60%/  normal bileaflet mechanical mvr/ moderated dilated la/ moderate tr  . WRIST ARTHROSCOPY Right 12-10-2007   debridement and ulnar shortening osteotomy w/ plate     Review of Systems Per HPI unless specifically indicated above     Objective:    BP 134/77   Pulse 83   Temp (!) 97.5 F (36.4 C) (Temporal)   Resp 16   Ht 5\' 4"  (1.626 m)   Wt 179 lb 9.6 oz (81.5 kg)   SpO2 100%   BMI 30.83 kg/m   Wt Readings from Last 3 Encounters:  12/01/19 179 lb 9.6 oz (81.5 kg)  06/07/19 185 lb (83.9 kg)  06/07/19 185 lb (83.9 kg)    Physical Exam Vitals and nursing note reviewed.  Constitutional:      General: She is not in acute distress.    Appearance: She is well-developed. She is not diaphoretic.     Comments: Well-appearing, comfortable, cooperative  HENT:     Head: Normocephalic and atraumatic.  Eyes:     General:        Right eye: No discharge.        Left eye: No discharge.     Conjunctiva/sclera: Conjunctivae normal.  Cardiovascular:     Rate and Rhythm: Normal rate.  Pulmonary:     Effort: Pulmonary effort is normal.  Musculoskeletal:     Comments: Left upper ext elbow Localized lateral aspect lateral epicondyl and muscle bulk with tenderness, no other deformity, no other bony tenderness no ecchymosis, no erythema or edema, no olecranon bursitis. Full ROM active. No reduced sensation or strength.  Skin:    General: Skin is warm and dry.     Findings: No erythema or rash.  Neurological:     Mental Status: She is alert and oriented to person, place, and time.    Psychiatric:        Behavior: Behavior normal.     Comments: Well groomed, good eye contact, normal speech and thoughts      CLINICAL DATA:  Screening.  EXAM: DIGITAL SCREENING BILATERAL MAMMOGRAM WITH TOMO AND CAD  COMPARISON:  Previous exam(s).  ACR Breast Density Category c: The breast tissue is heterogeneously dense, which may obscure small masses.  FINDINGS: There are no findings suspicious for malignancy. Images were processed with CAD.  IMPRESSION: No mammographic evidence of malignancy. A result letter of this screening mammogram will be mailed directly to the patient.  RECOMMENDATION: Screening mammogram in one year. (Code:[id number]-B-01Y)  BI-RADS CATEGORY  1: Negative.   Electronically Signed   By: Fidela Salisbury  M.D.   On: 11/06/2017 15:46 Results for orders placed or performed in visit on 11/16/19  POCT INR  Result Value Ref Range   INR 2.3 2.0 - 3.0      Assessment & Plan:   Problem List Items Addressed This Visit    Essential hypertension    Controlled HTN - Home BP readings stable  Complicated by chronic Atrial Fibrillation, MVR, Pulm HTN    Plan:  1. Continue current BP regimen Lisinopril 40mg  daily, Metoprolol XL 100mg  daily 2. Encourage improved lifestyle - low sodium diet, regular exercise 3. Continue monitor BP outside office, bring readings to next visit, if persistently >140/90 or new symptoms notify office sooner      Elevated hemoglobin A1c - Primary    Stable A1c at 5.5 previously, below range of PreDM Today defer A1c - will get with lab in 6 months Not on any therapy currently - remain off Encourage improved lifestyle - low carb, low sugar diet, limit sweet tea intake, reduce portion size, continue improving regular exercise       Other Visit Diagnoses    Encounter for screening mammogram for malignant neoplasm of breast       Relevant Orders   MM 3D SCREEN BREAST BILATERAL   Breast calcification, left        Relevant Orders   MM 3D SCREEN BREAST BILATERAL   Screening for colon cancer       Relevant Orders   Cologuard   Left lateral epicondylitis          Clinically L forearm tendonitis, more specifically lateral epicondylitis given location and provoking factors on exam - Likely acute on chronic flare due to repetitive daily activity. Suspect some underlying OA/DJD as well. - Unlikely to be acute muscle tear or fracture  Plan - Reviewed precautions with diagnosis and goal to allow it to heal / modify activity - Trial OTC Topical Voltaren gel PRN, avoid oral NSAID - Take Tylenol up to 1g TID PRN breakthrough - Recommend RICE therapy, emphasis on Forearm Strap to help support the muscle limit repetitive strain, may use ACE wrap for compression - Offered PT referral - declined today will reconsider if needed - Handout given on exercises at home --------  Re ordered Cologuard. Previous order 05/2019 never received by patient. Will provide new address for her requested shipping only [place], [place] [address]  Ordered Mammogram 3D Tomo screening. Due. Prior history of breast abnormality calcification on mammogram negative biopsy, was followed by Dr Bary Castilla no longer now.  Orders Placed This Encounter  Procedures  . MM 3D SCREEN BREAST BILATERAL    Standing Status:   Future    Standing Expiration Date:   06/01/2020    Order Specific Question:   Reason for Exam (SYMPTOM  OR DIAGNOSIS REQUIRED)    Answer:   Screening bilateral 3D Mammogram Tomo    Order Specific Question:   Preferred imaging location?    Answer:   [facility]  . Cologuard     No orders of the defined types were placed in this encounter.   Follow up plan: Return in about 6 months (around 06/01/2020) for Annual Physical.  Future labs ordered for 05/25/20   Nobie Putnam, [facility] 12/01/2019, 10:05 AM

## 2019-12-01 NOTE — Assessment & Plan Note (Signed)
Stable A1c at 5.5 previously, below range of PreDM Today defer A1c - will get with lab in 6 months Not on any therapy currently - remain off Encourage improved lifestyle - low carb, low sugar diet, limit sweet tea intake, reduce portion size, continue improving regular exercise

## 2019-12-07 ENCOUNTER — Other Ambulatory Visit: Payer: Self-pay

## 2019-12-07 ENCOUNTER — Ambulatory Visit (INDEPENDENT_AMBULATORY_CARE_PROVIDER_SITE_OTHER): Payer: Medicare HMO

## 2019-12-07 DIAGNOSIS — Z9889 Other specified postprocedural states: Secondary | ICD-10-CM

## 2019-12-07 DIAGNOSIS — I059 Rheumatic mitral valve disease, unspecified: Secondary | ICD-10-CM | POA: Diagnosis not present

## 2019-12-07 DIAGNOSIS — I4891 Unspecified atrial fibrillation: Secondary | ICD-10-CM | POA: Diagnosis not present

## 2019-12-07 DIAGNOSIS — Z5181 Encounter for therapeutic drug level monitoring: Secondary | ICD-10-CM

## 2019-12-07 DIAGNOSIS — I482 Chronic atrial fibrillation, unspecified: Secondary | ICD-10-CM

## 2019-12-07 LAB — POCT INR: INR: 3.6 — AB (ref 2.0–3.0)

## 2019-12-07 NOTE — Patient Instructions (Signed)
-    have a serving of greens today -  continue dosage of warfarin dosage of 1/2 tablet every day, EXCEPT 1 TABLET on MONDAYS, [place]. - Recheck in 3 weeks.

## 2019-12-08 ENCOUNTER — Ambulatory Visit
Admission: RE | Admit: 2019-12-08 | Discharge: 2019-12-08 | Disposition: A | Discharge status: Home / Self Care | Payer: Medicare HMO | Source: Ambulatory Visit | Attending: Family Medicine | Admitting: Family Medicine

## 2019-12-08 DIAGNOSIS — Z1231 Encounter for screening mammogram for malignant neoplasm of breast: Secondary | ICD-10-CM | POA: Diagnosis not present

## 2019-12-08 DIAGNOSIS — R921 Mammographic calcification found on diagnostic imaging of breast: Secondary | ICD-10-CM | POA: Diagnosis not present

## 2019-12-21 ENCOUNTER — Telehealth: Payer: Self-pay | Admitting: Family Medicine

## 2019-12-21 NOTE — Chronic Care Management (AMB) (Signed)
  Care Management   Note  12/21/2019 Name: [name] MRN: [id number] DOB: [birth year]  [name] is a 64 y.o. year old female who is a primary care patient of Olin Hauser, DO and is actively engaged with the care management team. I reached out to [name] by phone today to assist with re-scheduling a follow up visit with the RN Case Manager.  Follow up plan: Telephone appointment with care management team member scheduled for: 01/23/2020.  [place], [place] [id number] Direct Dial: [phone] [email or web address]

## 2019-12-28 ENCOUNTER — Other Ambulatory Visit: Payer: Self-pay

## 2019-12-28 ENCOUNTER — Ambulatory Visit (INDEPENDENT_AMBULATORY_CARE_PROVIDER_SITE_OTHER): Payer: Medicare HMO

## 2019-12-28 DIAGNOSIS — Z5181 Encounter for therapeutic drug level monitoring: Secondary | ICD-10-CM

## 2019-12-28 DIAGNOSIS — I482 Chronic atrial fibrillation, unspecified: Secondary | ICD-10-CM | POA: Diagnosis not present

## 2019-12-28 DIAGNOSIS — Z9889 Other specified postprocedural states: Secondary | ICD-10-CM

## 2019-12-28 DIAGNOSIS — I059 Rheumatic mitral valve disease, unspecified: Secondary | ICD-10-CM | POA: Diagnosis not present

## 2019-12-28 DIAGNOSIS — I4891 Unspecified atrial fibrillation: Secondary | ICD-10-CM

## 2019-12-28 LAB — POCT INR: INR: 4.3 — AB (ref 2.0–3.0)

## 2019-12-28 NOTE — Patient Instructions (Signed)
-    skip warfarin today and tomorrow -  on Friday, continue dosage of warfarin dosage of 1/2 tablet every day, EXCEPT 1 TABLET on MONDAYS, [place]. - Recheck in 3 weeks.  * HAVE YOUR GREENS ON A SCHEDULE

## 2020-01-09 ENCOUNTER — Telehealth: Payer: Self-pay

## 2020-01-18 ENCOUNTER — Other Ambulatory Visit: Payer: Self-pay

## 2020-01-18 ENCOUNTER — Ambulatory Visit (INDEPENDENT_AMBULATORY_CARE_PROVIDER_SITE_OTHER): Payer: Medicare HMO

## 2020-01-18 DIAGNOSIS — Z5181 Encounter for therapeutic drug level monitoring: Secondary | ICD-10-CM | POA: Diagnosis not present

## 2020-01-18 DIAGNOSIS — I482 Chronic atrial fibrillation, unspecified: Secondary | ICD-10-CM | POA: Diagnosis not present

## 2020-01-18 DIAGNOSIS — I059 Rheumatic mitral valve disease, unspecified: Secondary | ICD-10-CM | POA: Diagnosis not present

## 2020-01-18 DIAGNOSIS — I4891 Unspecified atrial fibrillation: Secondary | ICD-10-CM

## 2020-01-18 DIAGNOSIS — Z9889 Other specified postprocedural states: Secondary | ICD-10-CM

## 2020-01-18 LAB — POCT INR: INR: 4.5 — AB (ref 2.0–3.0)

## 2020-01-18 NOTE — Patient Instructions (Signed)
-    skip warfarin today and tomorrow -  on Friday, START NEW DOSAGE of warfarin dosage of 1/2 tablet every day, EXCEPT 1 TABLET on [place]. - Recheck in 2 weeks.  * HAVE YOUR GREENS ON A SCHEDULE

## 2020-01-23 ENCOUNTER — Telehealth: Payer: Self-pay | Admitting: General Practice

## 2020-01-23 ENCOUNTER — Ambulatory Visit (INDEPENDENT_AMBULATORY_CARE_PROVIDER_SITE_OTHER): Payer: Medicare HMO | Admitting: General Practice

## 2020-01-23 DIAGNOSIS — E7800 Pure hypercholesterolemia, unspecified: Secondary | ICD-10-CM | POA: Diagnosis not present

## 2020-01-23 DIAGNOSIS — I482 Chronic atrial fibrillation, unspecified: Secondary | ICD-10-CM

## 2020-01-23 DIAGNOSIS — M25522 Pain in left elbow: Secondary | ICD-10-CM

## 2020-01-23 DIAGNOSIS — I10 Essential (primary) hypertension: Secondary | ICD-10-CM | POA: Diagnosis not present

## 2020-01-23 NOTE — Patient Instructions (Signed)
Visit Information  Goals Addressed              This Visit's Progress     RNCM: Pt- "I could do better with the salt in my diet" (pt-stated)        CARE PLAN ENTRY (see longtitudinal plan of care for additional care plan information)  Current Barriers:   Chronic Disease Management support, education, and care coordination needs related to Atrial Fibrillation, HTN, and HLD  Clinical Goal(s) related to Atrial Fibrillation, HTN, and HLD:  Over the next 120days, patient will:   Work with the care management team to address educational, disease management, and care coordination needs   Begin or continue self health monitoring activities as directed today Measure and record blood pressure 5 times per week and adhere to a heart healthy diet  Call provider office for new or worsened signs and symptoms Blood pressure findings outside established parameters, Chest pain, and New or worsened symptom related to AFIB/HLD and other chronic conditions   Call care management team with questions or concerns  Verbalize basic understanding of patient centered plan of care established today  Interventions related to Atrial Fibrillation, HTN, and HLD:   Evaluation of current treatment plans and patient's adherence to plan as established by provider> 01-22-2020: the patient feels she is doing well at taking care of her HTN, AFIB, and HLD. The patient says her blood pressures are good.   Assessed patient understanding of disease states.  The patient has a good understanding of her chronic conditions and how to effectively manage.  Assessed patient's education and care coordination needs.  The patient does not have an AD/LW- written information sent via mail to the patient today on AD/LW  Provided disease specific education to patient.  Discussed heart healthy diet- will send information via my Chart system and EMMI on DASH diet.   Collaborated with appropriate clinical care team members regarding  patient needs.  Review of LCSW and pharmacist available for needs. Denies any needs at this time.   Evaluation of next appointment with the pcp: 06-01-2020.  Patient Self Care Activities related to Atrial Fibrillation, HTN, and HLD:   Patient is unable to independently self-manage chronic health conditions  Initial goal documentation and Please see past updates related to this goal by clicking on the "Past Updates" button in the selected goal        RNCM: Pt-"I am still having a lot of problems with my elbow" (pt-stated)        CARE PLAN ENTRY (see longitudinal plan of care for additional care plan information)  Current Barriers:   Knowledge Deficits related to pain in left elbow.  Per the patient the only thing that makes it feel better is "heat"  Nurse Case Manager Clinical Goal(s):   Over the next 120 days, patient will verbalize understanding of plan for finding the etiology of elbow pain and having a plan of care to help with relief of pain.  Over the next 120 days, patient will work with RNCM, pcp, and specialist  to address needs related to unrelieved elbow pain.   Over the next 120 days, patient will attend all scheduled medical appointments: sees specialist for elbow pain on 01-24-2020.  Over the next 120 days, patient will demonstrate improved adherence to prescribed treatment plan for pain relief of left elbow  as evidenced byreduced pain and discomfort.   Interventions:   Inter-disciplinary care team collaboration (see longitudinal plan of care)  Evaluation of current treatment  plan related to pain in left elbow and patient's adherence to plan as established by provider.  Advised patient to call the provider for worsening level or intensity of pain  Provided education to patient re: factors that may exacerbate pain and alternative pain relief measures.   Reviewed medications with patient and discussed the patient states the ultram has not helped with her pain at all  and the brace that she got has not been beneficial either.   Collaborated with pcp  regarding unresolved pain in her left elbow.   Discussed plans with patient for ongoing care management follow up and provided patient with direct contact information for care management team  Reviewed scheduled/upcoming provider appointments including: has an appointment with the specialist on 01-24-2020. Does not see the pcp again until December 2021.  Patient Self Care Activities:   Patient verbalizes understanding of plan to work with specialist, pcp and RNCM to help with pain in left elbow.   Attends all scheduled provider appointments  Calls provider office for new concerns or questions  Unable to independently mange chronic pain and discomfort.   Initial goal documentation        Patient verbalizes understanding of instructions provided today.   Telephone follow up appointment with care management team member scheduled for: 03-19-2020 at 0900  [place], MSN, [place] Mobile: [phone]

## 2020-01-23 NOTE — Chronic Care Management (AMB) (Signed)
Chronic Care Management   Follow Up Note   01/23/2020 Name: [name] MRN: [id number] DOB: [birth year]  Referred by: Joanna Hauser, DO Reason for referral : Chronic Care Management (RNCM: Folow up call for Chronic Disease Management and care coordination needs)   ANALYS [name] is a 64 y.o. year old female who is a primary care patient of Joanna Hauser, DO. The CCM team was consulted for assistance with chronic disease management and care coordination needs.    Review of patient status, including review of consultants reports, relevant laboratory and other test results, and collaboration with appropriate care team members and the patient's provider was performed as part of comprehensive patient evaluation and provision of chronic care management services.    SDOH (Social Determinants of Health) assessments performed: Yes See Care Plan activities for detailed interventions related to [facility])     Outpatient Encounter Medications as of 01/23/2020  Medication Sig   acyclovir (ZOVIRAX) 400 MG tablet TAKE 1 TABLET BY MOUTH 3 TIMES DAILY FORFIVE TO TEN DAYS OR UNTIL HEALED. FOR FEVER BLISTER.   alendronate (FOSAMAX) 35 MG tablet Take 1 tablet (35 mg total) by mouth every 7 (seven) days. Take with a full glass of water on an empty stomach.   amitriptyline (ELAVIL) 50 MG tablet Take 50 mg by mouth at bedtime as needed for sleep.    aspirin EC 81 MG tablet Take 1 tablet (81 mg total) by mouth daily.   atorvastatin (LIPITOR) 40 MG tablet TAKE 1 TABLET BY MOUTH ONCE EVERY EVENING   calcium carbonate (OSCAL) 1500 (600 Ca) MG TABS tablet Take 1 tablet by mouth daily.    cetirizine (ZYRTEC) 10 MG tablet Take 1 tablet (10 mg total) by mouth daily.   Cholecalciferol (VITAMIN D3) 2000 units capsule Take 1 capsule (2,000 Units total) by mouth daily.   cycloSPORINE (RESTASIS) 0.05 % ophthalmic emulsion Place 1 drop into both eyes 2 (two) times daily as needed (dry eyes).     dicyclomine (BENTYL) 10 MG capsule TAKE 1 CAPSULE BY MOUTH 4 TIMES A DAY BEFORE MEALS AND AT BEDTIME.   enoxaparin (LOVENOX) 80 MG/0.8ML injection Inject 0.8 mLs (80 mg total) into the skin every 12 (twelve) hours. (Patient not taking: Reported on 11/07/2019)   fluticasone (FLONASE) 50 MCG/ACT nasal spray PLACE 2 SPRAYS INTO BOTH NOSTRILS DAILY   furosemide (LASIX) 20 MG tablet TAKE 1 TABLET BY MOUTH EVERY OTHER DAY   gabapentin (NEURONTIN) 100 MG capsule  (Patient not taking: Reported on 12/01/2019)   ibuprofen (ADVIL,MOTRIN) 200 MG tablet Take 200 mg by mouth 2 (two) times daily as needed for headache or moderate pain.   lisinopril (ZESTRIL) 40 MG tablet TAKE 1 TABLET BY MOUTH ONCE A DAY   metoprolol succinate (TOPROL-XL) 100 MG 24 hr tablet TAKE 1 TABLET BY MOUTH ONCE A DAY **TAKEOR IMMEDIATELY FOLLOWING A MEAL**   Multiple Vitamins-Minerals (CENTRUM SILVER ADULT 50+ PO) Take 1 tablet by mouth daily.   warfarin (COUMADIN) 5 MG tablet TAKE 1 TABLET BY MOUTH ONCE A DAY OR AS DIRECTED BY COUMADIN CLINIC.   No facility-administered encounter medications on file as of 01/23/2020.     Objective:  BP Readings from Last 3 Encounters:  12/01/19 134/77  06/07/19 136/80  06/07/19 136/80    Goals Addressed              This Visit's Progress     RNCM: Pt- "I could do better with the salt in my diet" (pt-stated)  CARE PLAN ENTRY (see longtitudinal plan of care for additional care plan information)  Current Barriers:   Chronic Disease Management support, education, and care coordination needs related to Atrial Fibrillation, HTN, and HLD  Clinical Goal(s) related to Atrial Fibrillation, HTN, and HLD:  Over the next 120days, patient will:   Work with the care management team to address educational, disease management, and care coordination needs   Begin or continue self health monitoring activities as directed today Measure and record blood pressure 5 times per week and  adhere to a heart healthy diet  Call provider office for new or worsened signs and symptoms Blood pressure findings outside established parameters, Chest pain, and New or worsened symptom related to AFIB/HLD and other chronic conditions   Call care management team with questions or concerns  Verbalize basic understanding of patient centered plan of care established today  Interventions related to Atrial Fibrillation, HTN, and HLD:   Evaluation of current treatment plans and patient's adherence to plan as established by provider> 01-22-2020: the patient feels she is doing well at taking care of her HTN, AFIB, and HLD. The patient says her blood pressures are good.   Assessed patient understanding of disease states.  The patient has a good understanding of her chronic conditions and how to effectively manage.  Assessed patient's education and care coordination needs.  The patient does not have an AD/LW- written information sent via mail to the patient today on AD/LW  Provided disease specific education to patient.  Discussed heart healthy diet- will send information via my Chart system and EMMI on DASH diet.   Collaborated with appropriate clinical care team members regarding patient needs.  Review of LCSW and pharmacist available for needs. Denies any needs at this time.   Evaluation of next appointment with the pcp: 06-01-2020.  Patient Self Care Activities related to Atrial Fibrillation, HTN, and HLD:   Patient is unable to independently self-manage chronic health conditions  Initial goal documentation and Please see past updates related to this goal by clicking on the "Past Updates" button in the selected goal        RNCM: Pt-"I am still having a lot of problems with my elbow" (pt-stated)        CARE PLAN ENTRY (see longitudinal plan of care for additional care plan information)  Current Barriers:   Knowledge Deficits related to pain in left elbow.  Per the patient the only thing  that makes it feel better is "heat"  Nurse Case Manager Clinical Goal(s):   Over the next 120 days, patient will verbalize understanding of plan for finding the etiology of elbow pain and having a plan of care to help with relief of pain.  Over the next 120 days, patient will work with RNCM, pcp, and specialist  to address needs related to unrelieved elbow pain.   Over the next 120 days, patient will attend all scheduled medical appointments: sees specialist for elbow pain on 01-24-2020.  Over the next 120 days, patient will demonstrate improved adherence to prescribed treatment plan for pain relief of left elbow  as evidenced byreduced pain and discomfort.   Interventions:   Inter-disciplinary care team collaboration (see longitudinal plan of care)  Evaluation of current treatment plan related to pain in left elbow and patient's adherence to plan as established by provider.  Advised patient to call the provider for worsening level or intensity of pain  Provided education to patient re: factors that may exacerbate pain and alternative pain  relief measures.   Reviewed medications with patient and discussed the patient states the ultram has not helped with her pain at all and the brace that she got has not been beneficial either.   Collaborated with pcp  regarding unresolved pain in her left elbow.   Discussed plans with patient for ongoing care management follow up and provided patient with direct contact information for care management team  Reviewed scheduled/upcoming provider appointments including: has an appointment with the specialist on 01-24-2020. Does not see the pcp again until December 2021.  Patient Self Care Activities:   Patient verbalizes understanding of plan to work with specialist, pcp and RNCM to help with pain in left elbow.   Attends all scheduled provider appointments  Calls provider office for new concerns or questions  Unable to independently mange chronic  pain and discomfort.   Initial goal documentation         Plan:   Telephone follow up appointment with care management team member scheduled for: 03-19-2020 at 0900   [place], MSN, [place] Mobile: [phone]

## 2020-01-24 DIAGNOSIS — M7712 Lateral epicondylitis, left elbow: Secondary | ICD-10-CM | POA: Diagnosis not present

## 2020-01-24 DIAGNOSIS — M79602 Pain in left arm: Secondary | ICD-10-CM | POA: Diagnosis not present

## 2020-01-25 ENCOUNTER — Telehealth: Payer: Self-pay

## 2020-01-25 DIAGNOSIS — M7712 Lateral epicondylitis, left elbow: Secondary | ICD-10-CM

## 2020-01-25 MED ORDER — METHOCARBAMOL 500 MG PO TABS: 500.0000 mg | ORAL_TABLET | Freq: Four times a day (QID) | ORAL | 0 refills | Status: DC | PRN

## 2020-01-25 NOTE — Telephone Encounter (Signed)
Called patient.  Originally diagnosed lateral epicondylitis, 11/2019, she has now seen [facility] yesterday on 8/3 Dr Berenice Primas had injection cortisone, now has more pain, she called ortho, they are in surgery. She asks for a muscle relaxant to help pain and help rest. She has taken Robaxin methocarbamol in past, on chart confirmed, will re order short course, send to pharmacy, she is to call ortho back and f/u with them within 24-48 hours  [name], [facility] 01/25/2020, 4:49 PM

## 2020-01-25 NOTE — Telephone Encounter (Signed)
Copied from [place] [id number]. Topic: General - Other >> Jan 25, 2020  1:46 PM Leward Quan A wrote: Reason for CRM: Patient called to inform Dr [name] that she saw the orthopedic Dr and does have tennis elbow say that she received a cortisone shot on 01/24/20 but that today her elbow hurts even worse and need to know what Dr [name] can do to help with her pain. Please advise Ph# [phone]

## 2020-01-31 DIAGNOSIS — M4722 Other spondylosis with radiculopathy, cervical region: Secondary | ICD-10-CM | POA: Diagnosis not present

## 2020-01-31 DIAGNOSIS — M25512 Pain in left shoulder: Secondary | ICD-10-CM | POA: Diagnosis not present

## 2020-01-31 DIAGNOSIS — M67912 Unspecified disorder of synovium and tendon, left shoulder: Secondary | ICD-10-CM | POA: Diagnosis not present

## 2020-01-31 DIAGNOSIS — M542 Cervicalgia: Secondary | ICD-10-CM | POA: Diagnosis not present

## 2020-02-01 ENCOUNTER — Ambulatory Visit (INDEPENDENT_AMBULATORY_CARE_PROVIDER_SITE_OTHER): Payer: Medicare HMO

## 2020-02-01 ENCOUNTER — Other Ambulatory Visit: Payer: Self-pay

## 2020-02-01 DIAGNOSIS — I059 Rheumatic mitral valve disease, unspecified: Secondary | ICD-10-CM

## 2020-02-01 DIAGNOSIS — Z5181 Encounter for therapeutic drug level monitoring: Secondary | ICD-10-CM

## 2020-02-01 DIAGNOSIS — I482 Chronic atrial fibrillation, unspecified: Secondary | ICD-10-CM | POA: Diagnosis not present

## 2020-02-01 DIAGNOSIS — Z9889 Other specified postprocedural states: Secondary | ICD-10-CM | POA: Diagnosis not present

## 2020-02-01 DIAGNOSIS — I4891 Unspecified atrial fibrillation: Secondary | ICD-10-CM

## 2020-02-01 LAB — POCT INR: INR: 4.1 — AB (ref 2.0–3.0)

## 2020-02-01 NOTE — Patient Instructions (Signed)
-   skip warfarin today - START NEW DOSAGE of warfarin dosage of 1/2 tablet every day - Recheck in 2 weeks.  * HAVE YOUR GREENS ON A SCHEDULE

## 2020-02-06 ENCOUNTER — Other Ambulatory Visit: Payer: Self-pay | Admitting: Cardiology

## 2020-02-06 ENCOUNTER — Other Ambulatory Visit: Payer: Self-pay | Admitting: Family Medicine

## 2020-02-06 DIAGNOSIS — I10 Essential (primary) hypertension: Secondary | ICD-10-CM

## 2020-02-06 DIAGNOSIS — J3089 Other allergic rhinitis: Secondary | ICD-10-CM

## 2020-02-06 DIAGNOSIS — M8589 Other specified disorders of bone density and structure, multiple sites: Secondary | ICD-10-CM

## 2020-02-13 NOTE — Progress Notes (Signed)
HPI: FU atrial fibrillation and previous mitral valve replacement for rheumatic heart disease. Patient had mitral valve replacement in 1997. Cardiac catheterization in 2008 showed normal coronary arteries. ABIs with Doppler in July of 2014 normal. Holter monitor January 2015 showed heart rate was elevated and beta blocker was increased.  Echocardiogram May 2019 showed normal LV function, mechanical mitral valve prosthesis with mean gradient 5 mmHg, severe left atrial enlargement and mild right ventricular enlargement.  Since last seen,  she has dyspnea with more vigorous activities but not routine activities.  She denies orthopnea, PND, pedal edema, chest pain or syncope.  No bleeding.  Current Outpatient Medications  Medication Sig Dispense Refill  . acyclovir (ZOVIRAX) 400 MG tablet TAKE 1 TABLET BY MOUTH 3 TIMES DAILY FORFIVE TO TEN DAYS OR UNTIL HEALED. FOR FEVER BLISTER. 30 tablet 1  . alendronate (FOSAMAX) 35 MG tablet TAKE 1 TABLET BY MOUTH EVERY 7 DAYS. TAKE WITH A FULL GLASS OF WATER ON AN EMPTY STOMACH. 12 tablet 1  . amitriptyline (ELAVIL) 50 MG tablet Take 50 mg by mouth at bedtime as needed for sleep.     Marland Kitchen aspirin EC 81 MG tablet Take 1 tablet (81 mg total) by mouth daily. 90 tablet 3  . atorvastatin (LIPITOR) 40 MG tablet TAKE 1 TABLET BY MOUTH ONCE EVERY EVENING 90 tablet 3  . calcium carbonate (OSCAL) 1500 (600 Ca) MG TABS tablet Take 1 tablet by mouth daily.     . cetirizine (ZYRTEC) 10 MG tablet TAKE 1 TABLET BY MOUTH ONCE A DAY 90 tablet 3  . Cholecalciferol (VITAMIN D3) 2000 units capsule Take 1 capsule (2,000 Units total) by mouth daily.    . cycloSPORINE (RESTASIS) 0.05 % ophthalmic emulsion Place 1 drop into both eyes 2 (two) times daily as needed (dry eyes).    Marland Kitchen dicyclomine (BENTYL) 10 MG capsule TAKE 1 CAPSULE BY MOUTH 4 TIMES A DAY BEFORE MEALS AND AT BEDTIME. 90 capsule 1  . enoxaparin (LOVENOX) 80 MG/0.8ML injection Inject 0.8 mLs (80 mg total) into the skin  every 12 (twelve) hours. 16 mL 1  . fluticasone (FLONASE) 50 MCG/ACT nasal spray PLACE 2 SPRAYS INTO BOTH NOSTRILS DAILY 16 g 11  . furosemide (LASIX) 20 MG tablet TAKE 1 TABLET BY MOUTH EVERY OTHER DAY 45 tablet 3  . gabapentin (NEURONTIN) 100 MG capsule     . ibuprofen (ADVIL,MOTRIN) 200 MG tablet Take 200 mg by mouth 2 (two) times daily as needed for headache or moderate pain.    Marland Kitchen lisinopril (ZESTRIL) 40 MG tablet TAKE 1 TABLET BY MOUTH ONCE A DAY 30 tablet 1  . methocarbamol (ROBAXIN) 500 MG tablet Take 1 tablet (500 mg total) by mouth 4 (four) times daily as needed for muscle spasms (pain). 30 tablet 0  . metoprolol succinate (TOPROL-XL) 100 MG 24 hr tablet TAKE 1 TABLET BY MOUTH ONCE A DAY ** TAKE WITH OR IMMEDIATELY FOLLOWING A MEAL ** 90 tablet 3  . Multiple Vitamins-Minerals (CENTRUM SILVER ADULT 50+ PO) Take 1 tablet by mouth daily.    Marland Kitchen warfarin (COUMADIN) 5 MG tablet TAKE 1 TABLET BY MOUTH ONCE A DAY OR AS DIRECTED BY COUMADIN CLINIC. 90 tablet 1   No current facility-administered medications for this visit.     Past Medical History:  Diagnosis Date  . Anticoagulated on warfarin   . Arthritis   . Chronic atrial fibrillation ([place])    SINCE 1997  . Dysrhythmia   . Goiter  multinodular  . Heart murmur   . History of hyperthyroidism    2003--  PTU [place]  . Hypercholesterolemia   . Hyperlipidemia   . Hyperthyroidism    H/O  . Pulmonary hypertension, mild ([employer])   . Rheumatic heart disease    CARDIOLOGIST-- DR Marcello Moores [place]  . S/P MVR (mitral valve replacement)    1997  [facility]--  SECONDARY TO SEVERE MV STENOSIS    Past Surgical History:  Procedure Laterality Date  . ANTERIOR CERVICAL DECOMP/DISCECTOMY FUSION  12-21-2001   C5 --  C6  . BREAST BIOPSY Left 11/03/2016   Procedure: BREAST BIOPSY WITH NEEDLE LOCALIZATION;  Surgeon: Robert Bellow, MD;  Location: [facility] ORS;  Service: General;  Laterality: Left;  . BREAST EXCISIONAL BIOPSY Left 2018  . CARDIAC  CATHETERIZATION  12-28-2006  [facility]   NORMAL CORONARY ARTERIES  . CARPAL TUNNEL RELEASE Right 01-20-2007  . COLONOSCOPY  2015  . ERCP  08/15/2011   Procedure: ENDOSCOPIC RETROGRADE CHOLANGIOPANCREATOGRAPHY (ERCP);  Surgeon: Jeryl Columbia, MD;  Location: [facility] ENDOSCOPY;  Service: Endoscopy;  Laterality: N/A;  . ERCP     MULTIPLE--  INCLUDING STENTS, SPHINTEROTOMY'S  STONE REMOVAL   . FEMUR IM NAIL Right 04/26/2014   Procedure: INTRAMEDULLARY (IM) NAIL FEMORAL;  Surgeon: Alta Corning, MD;  Location: [place];  Service: Orthopedics;  Laterality: Right;  . I & D EXTREMITY Right 02/07/2013   Procedure: IRRIGATION AND DEBRIDEMENT OF RIGHT LOWER LEG WITH PLACEMENT OF ACELL AND WOULND VAC;  Surgeon: Theodoro Kos, DO;  Location: [place];  Service: Plastics;  Laterality: Right;  . LAPAROSCOPIC CHOLECYSTECTOMY  04-08-2002  . MITRAL VALVE REPLACEMENT  1997   [facility] [place] [facility]  . ORIF ANKLE FRACTURE Right 11/05/2012   Procedure: OPEN REDUCTION INTERNAL FIXATION (ORIF) ANKLE FRACTURE only operating on right;  Surgeon: Alta Corning, MD;  Location: [place];  Service: Orthopedics;  Laterality: Right;  . ORIF RIGHT ULNAR FX W/ ILIAC CREST BONE GRAFT  10-06-2008  . ORIF WRIST FRACTURE Right 04/26/2014   Procedure: OPEN REDUCTION INTERNAL FIXATION (ORIF) WRIST FRACTURE;  Surgeon: Alta Corning, MD;  Location: [place];  Service: Orthopedics;  Laterality: Right;  . TRANSTHORACIC ECHOCARDIOGRAM  06-29-2009  dr wall   normal lvsf/ ef 55-60%/  normal bileaflet mechanical mvr/ moderated dilated la/ moderate tr  . WRIST ARTHROSCOPY Right 12-10-2007   debridement and ulnar shortening osteotomy w/ plate    Social History   Socioeconomic History  . Marital status: Divorced    Spouse name: Not on file  . Number of children: 0  . Years of education: 22  . Highest education level: 12th grade  Occupational History  . Occupation: Control and instrumentation engineer    Comment: retired  Tobacco Use  . Smoking status: Never Smoker   . Smokeless tobacco: Never Used  Vaping Use  . Vaping Use: Never used  Substance and Sexual Activity  . Alcohol use: No  . Drug use: No  . Sexual activity: Not on file  Other Topics Concern  . Not on file  Social History Narrative  . Not on file   Social Determinants of Health   Financial Resource Strain: Low Risk   . Difficulty of Paying Living Expenses: Not very hard  Food Insecurity: No Food Insecurity  . Worried About Charity fundraiser in the Last Year: Never true  . Ran Out of Food in the Last Year: Never true  Transportation Needs: No Transportation Needs  . Lack  of Transportation (Medical): No  . Lack of Transportation (Non-Medical): No  Physical Activity: Inactive  . Days of Exercise per Week: 0 days  . Minutes of Exercise per Session: 0 min  Stress: No Stress Concern Present  . Feeling of Stress : Not at all  Social Connections: Moderately Integrated  . Frequency of Communication with Friends and Family: More than three times a week  . Frequency of Social Gatherings with Friends and Family: More than three times a week  . Attends Religious Services: More than 4 times per year  . Active Member of Clubs or Organizations: Yes  . Attends Archivist Meetings: More than 4 times per year  . Marital Status: Divorced  Human resources officer Violence: Not At Risk  . Fear of Current or Ex-Partner: No  . Emotionally Abused: No  . Physically Abused: No  . Sexually Abused: No    Family History  Problem Relation Age of Onset  . Hypothyroidism Sister   . Gallbladder disease Sister   . Coronary artery disease Other   . Diabetes Mother   . Hypertension Mother   . Heart disease Father   . Stroke Father   . Breast cancer Neg Hx     ROS: no fevers or chills, productive cough, hemoptysis, dysphasia, odynophagia, melena, hematochezia, dysuria, hematuria, rash, seizure activity, orthopnea, PND, pedal edema, claudication. Remaining systems are negative.  Physical  Exam: Well-developed well-nourished in no acute distress.  Skin is warm and dry.  HEENT is normal.  Neck is supple.  Chest is clear to auscultation with normal expansion.  Cardiovascular exam is irregular; crisp mechanical valve sound Abdominal exam nontender or distended. No masses palpated. Extremities show no edema. neuro grossly intact  ECG-atrial fibrillation at a rate of 79, no ST changes.  Personally reviewed  A/P  1 prior mitral valve replacement-continue Coumadin, aspirin and SBE prophylaxis.  2 hypertension-blood pressure controlled.  Continue present medical regimen.  3 permanent atrial fibrillation-continue beta-blocker for rate control.  Continue Coumadin.  4 hyperlipidemia continue statin.  Kirk Ruths, MD

## 2020-02-15 ENCOUNTER — Ambulatory Visit (INDEPENDENT_AMBULATORY_CARE_PROVIDER_SITE_OTHER): Payer: Medicare HMO

## 2020-02-15 ENCOUNTER — Other Ambulatory Visit: Payer: Self-pay

## 2020-02-15 DIAGNOSIS — Z9889 Other specified postprocedural states: Secondary | ICD-10-CM

## 2020-02-15 DIAGNOSIS — I4891 Unspecified atrial fibrillation: Secondary | ICD-10-CM

## 2020-02-15 DIAGNOSIS — I482 Chronic atrial fibrillation, unspecified: Secondary | ICD-10-CM

## 2020-02-15 DIAGNOSIS — Z5181 Encounter for therapeutic drug level monitoring: Secondary | ICD-10-CM

## 2020-02-15 DIAGNOSIS — I059 Rheumatic mitral valve disease, unspecified: Secondary | ICD-10-CM

## 2020-02-15 LAB — POCT INR: INR: 1.9 — AB (ref 2.0–3.0)

## 2020-02-15 NOTE — Patient Instructions (Signed)
-   START NEW DOSAGE of warfarin dosage of 1/2 tablet every day EXCEPT 1 WHOLE TABLET ON WEDNESDAYS - Recheck in 2 weeks.  * HAVE YOUR GREENS ON A SCHEDULE

## 2020-02-20 ENCOUNTER — Ambulatory Visit: Payer: Medicare HMO | Admitting: Cardiology

## 2020-02-20 ENCOUNTER — Encounter: Payer: Self-pay | Admitting: Cardiology

## 2020-02-20 ENCOUNTER — Other Ambulatory Visit: Payer: Self-pay

## 2020-02-20 VITALS — BP 114/86 | HR 79 | Ht 64.0 in | Wt 173.6 lb

## 2020-02-20 DIAGNOSIS — I4821 Permanent atrial fibrillation: Secondary | ICD-10-CM

## 2020-02-20 DIAGNOSIS — Z9889 Other specified postprocedural states: Secondary | ICD-10-CM

## 2020-02-20 DIAGNOSIS — I10 Essential (primary) hypertension: Secondary | ICD-10-CM | POA: Diagnosis not present

## 2020-02-20 NOTE — Patient Instructions (Signed)
Medication Instructions:  NO CHANGE *If you need a refill on your cardiac medications before your next appointment, please call your pharmacy*   Lab Work: If you have labs (blood work) drawn today and your tests are completely normal, you will receive your results only by: . MyChart Message (if you have MyChart) OR . A paper copy in the mail If you have any lab test that is abnormal or we need to change your treatment, we will call you to review the results.   Follow-Up: At [facility], you and your health needs are our priority.  As part of our continuing mission to provide you with exceptional heart care, we have created designated Provider Care Teams.  These Care Teams include your primary Cardiologist (physician) and Advanced Practice Providers (APPs -  Physician Assistants and Nurse Practitioners) who all work together to provide you with the care you need, when you need it.  We recommend signing up for the patient portal called "MyChart".  Sign up information is provided on this After Visit Summary.  MyChart is used to connect with patients for Virtual Visits (Telemedicine).  Patients are able to view lab/test results, encounter notes, upcoming appointments, etc.  Non-urgent messages can be sent to your provider as well.   To learn more about what you can do with MyChart, go to [email or web address].com.    Your next appointment:   12 month(s)  The format for your next appointment:   In Person  Provider:   You may see BRIAN CRENSHAW MD or one of the following Advanced Practice Providers on your designated Care Team:    Luke Kilroy, PA-C  Callie Goodrich, PA-C  Jesse Cleaver, FNP     

## 2020-02-29 ENCOUNTER — Ambulatory Visit (INDEPENDENT_AMBULATORY_CARE_PROVIDER_SITE_OTHER): Payer: Medicare HMO

## 2020-02-29 ENCOUNTER — Other Ambulatory Visit: Payer: Self-pay

## 2020-02-29 DIAGNOSIS — I059 Rheumatic mitral valve disease, unspecified: Secondary | ICD-10-CM | POA: Diagnosis not present

## 2020-02-29 DIAGNOSIS — Z9889 Other specified postprocedural states: Secondary | ICD-10-CM

## 2020-02-29 DIAGNOSIS — I4891 Unspecified atrial fibrillation: Secondary | ICD-10-CM

## 2020-02-29 DIAGNOSIS — Z5181 Encounter for therapeutic drug level monitoring: Secondary | ICD-10-CM | POA: Diagnosis not present

## 2020-02-29 DIAGNOSIS — I482 Chronic atrial fibrillation, unspecified: Secondary | ICD-10-CM

## 2020-02-29 LAB — POCT INR: INR: 2.6 (ref 2.0–3.0)

## 2020-02-29 NOTE — Patient Instructions (Signed)
-   take 1 whole tablet today & tomorrow, then  - on Friday, resume dosage of warfarin dosage of 1/2 tablet every day EXCEPT 1 WHOLE TABLET ON WEDNESDAYS - Recheck in 2 weeks.  * HAVE YOUR GREENS ON A SCHEDULE

## 2020-03-13 ENCOUNTER — Other Ambulatory Visit: Payer: Self-pay | Admitting: Cardiology

## 2020-03-13 DIAGNOSIS — I10 Essential (primary) hypertension: Secondary | ICD-10-CM

## 2020-03-14 ENCOUNTER — Other Ambulatory Visit: Payer: Self-pay

## 2020-03-14 ENCOUNTER — Ambulatory Visit (INDEPENDENT_AMBULATORY_CARE_PROVIDER_SITE_OTHER): Payer: Medicare HMO

## 2020-03-14 DIAGNOSIS — Z5181 Encounter for therapeutic drug level monitoring: Secondary | ICD-10-CM

## 2020-03-14 DIAGNOSIS — I059 Rheumatic mitral valve disease, unspecified: Secondary | ICD-10-CM | POA: Diagnosis not present

## 2020-03-14 DIAGNOSIS — Z9889 Other specified postprocedural states: Secondary | ICD-10-CM | POA: Diagnosis not present

## 2020-03-14 DIAGNOSIS — I4891 Unspecified atrial fibrillation: Secondary | ICD-10-CM | POA: Diagnosis not present

## 2020-03-14 DIAGNOSIS — I482 Chronic atrial fibrillation, unspecified: Secondary | ICD-10-CM | POA: Diagnosis not present

## 2020-03-14 DIAGNOSIS — E559 Vitamin D deficiency, unspecified: Secondary | ICD-10-CM

## 2020-03-14 LAB — POCT INR: INR: 4.6 — AB (ref 2.0–3.0)

## 2020-03-14 NOTE — Patient Instructions (Signed)
-   skip warfarin today & tomorrow - on Friday, resume dosage of warfarin dosage of 1/2 tablet every day EXCEPT 1 WHOLE TABLET ON WEDNESDAYS - Recheck in 2 weeks.  * HAVE YOUR GREENS ON A SCHEDULE

## 2020-03-19 ENCOUNTER — Ambulatory Visit (INDEPENDENT_AMBULATORY_CARE_PROVIDER_SITE_OTHER): Payer: Medicare HMO | Admitting: General Practice

## 2020-03-19 ENCOUNTER — Telehealth: Payer: Self-pay | Admitting: General Practice

## 2020-03-19 DIAGNOSIS — M25522 Pain in left elbow: Secondary | ICD-10-CM

## 2020-03-19 DIAGNOSIS — I482 Chronic atrial fibrillation, unspecified: Secondary | ICD-10-CM

## 2020-03-19 DIAGNOSIS — E7800 Pure hypercholesterolemia, unspecified: Secondary | ICD-10-CM | POA: Diagnosis not present

## 2020-03-19 DIAGNOSIS — I10 Essential (primary) hypertension: Secondary | ICD-10-CM | POA: Diagnosis not present

## 2020-03-19 NOTE — Chronic Care Management (AMB) (Signed)
Chronic Care Management   Follow Up Note   03/19/2020 Name: [name] MRN: [id number] DOB: [birth year]  Referred by: Olin Hauser, DO Reason for referral : Chronic Care Management (RNCM Follow up call for Chronic Disease Management and Care Coordination Needs)   [name] is a 64 y.o. year old female who is a primary care patient of Olin Hauser, DO. The CCM team was consulted for assistance with chronic disease management and care coordination needs.    Review of patient status, including review of consultants reports, relevant laboratory and other test results, and collaboration with appropriate care team members and the patient's provider was performed as part of comprehensive patient evaluation and provision of chronic care management services.    SDOH (Social Determinants of Health) assessments performed: Yes See Care Plan activities for detailed interventions related to [facility])     Outpatient Encounter Medications as of 03/19/2020  Medication Sig  . acyclovir (ZOVIRAX) 400 MG tablet TAKE 1 TABLET BY MOUTH 3 TIMES DAILY FORFIVE TO TEN DAYS OR UNTIL HEALED. FOR FEVER BLISTER.  Marland Kitchen alendronate (FOSAMAX) 35 MG tablet TAKE 1 TABLET BY MOUTH EVERY 7 DAYS. TAKE WITH A FULL GLASS OF WATER ON AN EMPTY STOMACH.  Marland Kitchen amitriptyline (ELAVIL) 50 MG tablet Take 50 mg by mouth at bedtime as needed for sleep.   Marland Kitchen aspirin EC 81 MG tablet Take 1 tablet (81 mg total) by mouth daily.  Marland Kitchen atorvastatin (LIPITOR) 40 MG tablet TAKE 1 TABLET BY MOUTH ONCE EVERY EVENING  . calcium carbonate (OSCAL) 1500 (600 Ca) MG TABS tablet Take 1 tablet by mouth daily.   . cetirizine (ZYRTEC) 10 MG tablet TAKE 1 TABLET BY MOUTH ONCE A DAY  . Cholecalciferol (VITAMIN D3) 2000 units capsule Take 1 capsule (2,000 Units total) by mouth daily.  . cycloSPORINE (RESTASIS) 0.05 % ophthalmic emulsion Place 1 drop into both eyes 2 (two) times daily as needed (dry eyes).  Marland Kitchen dicyclomine (BENTYL) 10 MG  capsule TAKE 1 CAPSULE BY MOUTH 4 TIMES A DAY BEFORE MEALS AND AT BEDTIME.  Marland Kitchen enoxaparin (LOVENOX) 80 MG/0.8ML injection Inject 0.8 mLs (80 mg total) into the skin every 12 (twelve) hours.  . fluticasone (FLONASE) 50 MCG/ACT nasal spray PLACE 2 SPRAYS INTO BOTH NOSTRILS DAILY  . furosemide (LASIX) 20 MG tablet TAKE 1 TABLET BY MOUTH EVERY OTHER DAY  . gabapentin (NEURONTIN) 100 MG capsule   . ibuprofen (ADVIL,MOTRIN) 200 MG tablet Take 200 mg by mouth 2 (two) times daily as needed for headache or moderate pain.  Marland Kitchen lisinopril (ZESTRIL) 40 MG tablet TAKE 1 TABLET BY MOUTH ONCE A DAY  . methocarbamol (ROBAXIN) 500 MG tablet Take 1 tablet (500 mg total) by mouth 4 (four) times daily as needed for muscle spasms (pain).  . metoprolol succinate (TOPROL-XL) 100 MG 24 hr tablet TAKE 1 TABLET BY MOUTH ONCE A DAY ** TAKE WITH OR IMMEDIATELY FOLLOWING A MEAL **  . Multiple Vitamins-Minerals (CENTRUM SILVER ADULT 50+ PO) Take 1 tablet by mouth daily.  Marland Kitchen warfarin (COUMADIN) 5 MG tablet TAKE 1 TABLET BY MOUTH ONCE A DAY OR AS DIRECTED BY COUMADIN CLINIC.   No facility-administered encounter medications on file as of 03/19/2020.     Objective:  BP Readings from Last 3 Encounters:  02/20/20 114/86  12/01/19 134/77  06/07/19 136/80    Goals Addressed              This Visit's Progress   .  RNCM: Pt- "I  could do better with the salt in my diet" (pt-stated)        CARE PLAN ENTRY (see longtitudinal plan of care for additional care plan information)  Current Barriers:  . Chronic Disease Management support, education, and care coordination needs related to Atrial Fibrillation, HTN, and HLD  Clinical Goal(s) related to Atrial Fibrillation, HTN, and HLD:  Over the next 120days, patient will:  . Work with the care management team to address educational, disease management, and care coordination needs  . Begin or continue self health monitoring activities as directed today Measure and record blood  pressure 5 times per week and adhere to a heart healthy diet . Call provider office for new or worsened signs and symptoms Blood pressure findings outside established parameters, Chest pain, and New or worsened symptom related to AFIB/HLD and other chronic conditions  . Call care management team with questions or concerns . Verbalize basic understanding of patient centered plan of care established today  Interventions related to Atrial Fibrillation, HTN, and HLD:  . Evaluation of current treatment plans and patient's adherence to plan as established by provider 03-19-2020: the patient feels she is doing well at taking care of her HTN, AFIB, and HLD. The patient says her blood pressures are good. She is having issues with her INR and having to have it checked more frequently. She states they want it to be between 3.0 and 3.5, however hers has been thinner.  Education and support given.  . Assessed patient understanding of disease states.  The patient has a good understanding of her chronic conditions and how to effectively manage.  03-19-2020: The patient has been experiencing some elbow pain but is working with the provider and has had shots in her elbow and shoulder that have helped.  . Assessed patient's education and care coordination needs.  The patient does not have an AD/LW- written information sent via mail to the patient today on AD/LW . Provided disease specific education to patient.  Discussed heart healthy diet- will send information via my Chart system and EMMI on DASH diet. 03-19-2020: The patients states that since being intubated in 2010 she hates food. She eats but it is not her favorite thing to do. The patient states that she eats and stays hydrated but does not have issues with maintaining her weight or over eating. Endorses following sodium restrictions.  Nash Dimmer with appropriate clinical care team members regarding patient needs.  Review of LCSW and pharmacist available for needs.  Denies any needs at this time.  . Evaluation of next appointment with the pcp: 06-01-2020.  Patient Self Care Activities related to Atrial Fibrillation, HTN, and HLD:  . Patient is unable to independently self-manage chronic health conditions  Initial goal documentation and Please see past updates related to this goal by clicking on the "Past Updates" button in the selected goal      .  RNCM: Pt-"I am still having a lot of problems with my elbow" (pt-stated)        CARE PLAN ENTRY (see longitudinal plan of care for additional care plan information)  Current Barriers:  Marland Kitchen Knowledge Deficits related to pain in left elbow.  Per the patient the only thing that makes it feel better is "heat"  Nurse Case Manager Clinical Goal(s):  Marland Kitchen Over the next 120 days, patient will verbalize understanding of plan for finding the etiology of elbow pain and having a plan of care to help with relief of pain. . Over the next  120 days, patient will work with RNCM, pcp, and specialist  to address needs related to unrelieved elbow pain.  . Over the next 120 days, patient will attend all scheduled medical appointments: sees specialist for elbow pain on 01-24-2020. Marland Kitchen Over the next 120 days, patient will demonstrate improved adherence to prescribed treatment plan for pain relief of left elbow  as evidenced byreduced pain and discomfort.   Interventions:  . Inter-disciplinary care team collaboration (see longitudinal plan of care) . Evaluation of current treatment plan related to pain in left elbow and patient's adherence to plan as established by provider. 03-19-2020: The patient had shots in her elbow and in her shoulder and the shot in her shoulder helped her pain more than the shot in her elbow. She states the doctor feels it is because of arthritis and where she had neck surgery. The patient also states that the weather change exacerbates it but she is having a good day today.  . Advised patient to call the provider for  worsening level or intensity of pain.  03-19-2020: The patient denies any pain or discomfort today. The patient knows to call the provider for changes in level of intensity of pain.  . Provided education to patient re: factors that may exacerbate pain and alternative pain relief measures. 03-19-2020: the patient is using the volteran gel to help when she is having pain flare ups.  . Reviewed medications with patient and discussed the patient states the ultram has not helped with her pain at all and the brace that she got has not been beneficial either. 03-19-2020: The patient states the cortisone shots in her elbow and shoulder have been most beneficial for the elbow pain.  Marland Kitchen Collaborated with pcp  regarding unresolved pain in her left elbow.  . Discussed plans with patient for ongoing care management follow up and provided patient with direct contact information for care management team . Reviewed scheduled/upcoming provider appointments including: Does not see the pcp again until June 01, 2020.  Patient Self Care Activities:  . Patient verbalizes understanding of plan to work with specialist, pcp and RNCM to help with pain in left elbow.  . Attends all scheduled provider appointments . Calls provider office for new concerns or questions . Unable to independently mange chronic pain and discomfort.   Please see past updates related to this goal by clicking on the "Past Updates" button in the selected goal          Plan:   Telephone follow up appointment with care management team member scheduled for: 06-04-2020 at 0900 am   Noreene Larsson RN, MSN, [place] Mobile: [phone]

## 2020-03-19 NOTE — Patient Instructions (Signed)
Visit Information  Goals Addressed              This Visit's Progress   .  RNCM: Pt- "I could do better with the salt in my diet" (pt-stated)        CARE PLAN ENTRY (see longtitudinal plan of care for additional care plan information)  Current Barriers:  . Chronic Disease Management support, education, and care coordination needs related to Atrial Fibrillation, HTN, and HLD  Clinical Goal(s) related to Atrial Fibrillation, HTN, and HLD:  Over the next 120days, patient will:  . Work with the care management team to address educational, disease management, and care coordination needs  . Begin or continue self health monitoring activities as directed today Measure and record blood pressure 5 times per week and adhere to a heart healthy diet . Call provider office for new or worsened signs and symptoms Blood pressure findings outside established parameters, Chest pain, and New or worsened symptom related to AFIB/HLD and other chronic conditions  . Call care management team with questions or concerns . Verbalize basic understanding of patient centered plan of care established today  Interventions related to Atrial Fibrillation, HTN, and HLD:  . Evaluation of current treatment plans and patient's adherence to plan as established by provider 03-19-2020: the patient feels she is doing well at taking care of her HTN, AFIB, and HLD. The patient says her blood pressures are good. She is having issues with her INR and having to have it checked more frequently. She states they want it to be between 3.0 and 3.5, however hers has been thinner.  Education and support given.  . Assessed patient understanding of disease states.  The patient has a good understanding of her chronic conditions and how to effectively manage.  03-19-2020: The patient has been experiencing some elbow pain but is working with the provider and has had shots in her elbow and shoulder that have helped.  . Assessed patient's education  and care coordination needs.  The patient does not have an AD/LW- written information sent via mail to the patient today on AD/LW . Provided disease specific education to patient.  Discussed heart healthy diet- will send information via my Chart system and EMMI on DASH diet. 03-19-2020: The patients states that since being intubated in 2010 she hates food. She eats but it is not her favorite thing to do. The patient states that she eats and stays hydrated but does not have issues with maintaining her weight or over eating. Endorses following sodium restrictions.  Nash Dimmer with appropriate clinical care team members regarding patient needs.  Review of LCSW and pharmacist available for needs. Denies any needs at this time.  . Evaluation of next appointment with the pcp: 06-01-2020.  Patient Self Care Activities related to Atrial Fibrillation, HTN, and HLD:  . Patient is unable to independently self-manage chronic health conditions  Initial goal documentation and Please see past updates related to this goal by clicking on the "Past Updates" button in the selected goal      .  RNCM: Pt-"I am still having a lot of problems with my elbow" (pt-stated)        CARE PLAN ENTRY (see longitudinal plan of care for additional care plan information)  Current Barriers:  Marland Kitchen Knowledge Deficits related to pain in left elbow.  Per the patient the only thing that makes it feel better is "heat"  Nurse Case Manager Clinical Goal(s):  Marland Kitchen Over the next 120 days, patient will  verbalize understanding of plan for finding the etiology of elbow pain and having a plan of care to help with relief of pain. . Over the next 120 days, patient will work with RNCM, pcp, and specialist  to address needs related to unrelieved elbow pain.  . Over the next 120 days, patient will attend all scheduled medical appointments: sees specialist for elbow pain on 01-24-2020. Marland Kitchen Over the next 120 days, patient will demonstrate improved adherence  to prescribed treatment plan for pain relief of left elbow  as evidenced byreduced pain and discomfort.   Interventions:  . Inter-disciplinary care team collaboration (see longitudinal plan of care) . Evaluation of current treatment plan related to pain in left elbow and patient's adherence to plan as established by provider. 03-19-2020: The patient had shots in her elbow and in her shoulder and the shot in her shoulder helped her pain more than the shot in her elbow. She states the doctor feels it is because of arthritis and where she had neck surgery. The patient also states that the weather change exacerbates it but she is having a good day today.  . Advised patient to call the provider for worsening level or intensity of pain.  03-19-2020: The patient denies any pain or discomfort today. The patient knows to call the provider for changes in level of intensity of pain.  . Provided education to patient re: factors that may exacerbate pain and alternative pain relief measures. 03-19-2020: the patient is using the volteran gel to help when she is having pain flare ups.  . Reviewed medications with patient and discussed the patient states the ultram has not helped with her pain at all and the brace that she got has not been beneficial either. 03-19-2020: The patient states the cortisone shots in her elbow and shoulder have been most beneficial for the elbow pain.  Marland Kitchen Collaborated with pcp  regarding unresolved pain in her left elbow.  . Discussed plans with patient for ongoing care management follow up and provided patient with direct contact information for care management team . Reviewed scheduled/upcoming provider appointments including: Does not see the pcp again until June 01, 2020.  Patient Self Care Activities:  . Patient verbalizes understanding of plan to work with specialist, pcp and RNCM to help with pain in left elbow.  . Attends all scheduled provider appointments . Calls provider office  for new concerns or questions . Unable to independently mange chronic pain and discomfort.   Please see past updates related to this goal by clicking on the "Past Updates" button in the selected goal         Patient verbalizes understanding of instructions provided today.   Telephone follow up appointment with care management team member scheduled for: 06-04-2020 at 0900 am  Noreene Larsson RN, MSN, [place] Mobile: [phone]

## 2020-03-28 ENCOUNTER — Other Ambulatory Visit: Payer: Self-pay

## 2020-03-28 ENCOUNTER — Ambulatory Visit (INDEPENDENT_AMBULATORY_CARE_PROVIDER_SITE_OTHER): Payer: Medicare HMO

## 2020-03-28 DIAGNOSIS — I482 Chronic atrial fibrillation, unspecified: Secondary | ICD-10-CM | POA: Diagnosis not present

## 2020-03-28 DIAGNOSIS — Z9889 Other specified postprocedural states: Secondary | ICD-10-CM | POA: Diagnosis not present

## 2020-03-28 DIAGNOSIS — Z5181 Encounter for therapeutic drug level monitoring: Secondary | ICD-10-CM | POA: Diagnosis not present

## 2020-03-28 DIAGNOSIS — I059 Rheumatic mitral valve disease, unspecified: Secondary | ICD-10-CM

## 2020-03-28 LAB — POCT INR: INR: 1.8 — AB (ref 2.0–3.0)

## 2020-03-28 NOTE — Patient Instructions (Signed)
-   take 1.5 tablets warfarin, - take 1 tablet tomorrow (Thursday),  - on Friday, resume dosage of warfarin dosage of 1/2 tablet every day EXCEPT 1 WHOLE TABLET ON WEDNESDAYS - Recheck in 2 weeks.  * HAVE YOUR GREENS ON A SCHEDULE

## 2020-04-11 ENCOUNTER — Other Ambulatory Visit: Payer: Self-pay

## 2020-04-11 ENCOUNTER — Ambulatory Visit (INDEPENDENT_AMBULATORY_CARE_PROVIDER_SITE_OTHER): Payer: Medicare HMO

## 2020-04-11 DIAGNOSIS — I4891 Unspecified atrial fibrillation: Secondary | ICD-10-CM

## 2020-04-11 DIAGNOSIS — I059 Rheumatic mitral valve disease, unspecified: Secondary | ICD-10-CM | POA: Diagnosis not present

## 2020-04-11 DIAGNOSIS — Z5181 Encounter for therapeutic drug level monitoring: Secondary | ICD-10-CM | POA: Diagnosis not present

## 2020-04-11 DIAGNOSIS — I482 Chronic atrial fibrillation, unspecified: Secondary | ICD-10-CM | POA: Diagnosis not present

## 2020-04-11 DIAGNOSIS — Z9889 Other specified postprocedural states: Secondary | ICD-10-CM

## 2020-04-11 DIAGNOSIS — Z7901 Long term (current) use of anticoagulants: Secondary | ICD-10-CM | POA: Diagnosis not present

## 2020-04-11 DIAGNOSIS — Z952 Presence of prosthetic heart valve: Secondary | ICD-10-CM

## 2020-04-11 LAB — POCT INR: INR: 2 (ref 2.0–3.0)

## 2020-04-11 NOTE — Patient Instructions (Signed)
-   take 1.5 tablets warfarin, - take 1 tablet tomorrow (Thursday),  - on Friday, resume dosage of warfarin dosage of 1/2 tablet every day EXCEPT 1 WHOLE TABLET ON WEDNESDAYS - Recheck in 2 weeks.

## 2020-04-25 ENCOUNTER — Other Ambulatory Visit: Payer: Self-pay

## 2020-04-25 ENCOUNTER — Ambulatory Visit (INDEPENDENT_AMBULATORY_CARE_PROVIDER_SITE_OTHER): Payer: Medicare HMO

## 2020-04-25 DIAGNOSIS — Z9889 Other specified postprocedural states: Secondary | ICD-10-CM | POA: Diagnosis not present

## 2020-04-25 DIAGNOSIS — I482 Chronic atrial fibrillation, unspecified: Secondary | ICD-10-CM | POA: Diagnosis not present

## 2020-04-25 DIAGNOSIS — I4891 Unspecified atrial fibrillation: Secondary | ICD-10-CM | POA: Diagnosis not present

## 2020-04-25 DIAGNOSIS — I059 Rheumatic mitral valve disease, unspecified: Secondary | ICD-10-CM | POA: Diagnosis not present

## 2020-04-25 DIAGNOSIS — Z5181 Encounter for therapeutic drug level monitoring: Secondary | ICD-10-CM

## 2020-04-25 LAB — POCT INR
INR: 2 (ref 2.0–3.0)
INR: 2 (ref 2.0–3.0)

## 2020-04-25 NOTE — Patient Instructions (Signed)
-   take 1.5 tablets warfarin, - tomorrow, START NEW DOSAGE of warfarin dosage of 1/2 tablet every day EXCEPT 1 WHOLE TABLET ON [place].  - Recheck in 1 week  * HAVE YOUR GREENS ON A SCHEDULE

## 2020-05-02 ENCOUNTER — Ambulatory Visit (INDEPENDENT_AMBULATORY_CARE_PROVIDER_SITE_OTHER): Payer: Medicare HMO

## 2020-05-02 ENCOUNTER — Other Ambulatory Visit: Payer: Self-pay

## 2020-05-02 DIAGNOSIS — Z9889 Other specified postprocedural states: Secondary | ICD-10-CM

## 2020-05-02 DIAGNOSIS — Z5181 Encounter for therapeutic drug level monitoring: Secondary | ICD-10-CM

## 2020-05-02 DIAGNOSIS — I4891 Unspecified atrial fibrillation: Secondary | ICD-10-CM

## 2020-05-02 DIAGNOSIS — I482 Chronic atrial fibrillation, unspecified: Secondary | ICD-10-CM

## 2020-05-02 DIAGNOSIS — I059 Rheumatic mitral valve disease, unspecified: Secondary | ICD-10-CM

## 2020-05-02 LAB — POCT INR: INR: 3.1 — AB (ref 2.0–3.0)

## 2020-05-02 NOTE — Patient Instructions (Signed)
-   continue dosage of warfarin of 1/2 tablet every day EXCEPT 1 WHOLE TABLET ON [place].  - Recheck in 2 weeks  * HAVE YOUR GREENS ON A SCHEDULE

## 2020-05-03 ENCOUNTER — Other Ambulatory Visit: Payer: Self-pay | Admitting: Cardiology

## 2020-05-03 DIAGNOSIS — R0600 Dyspnea, unspecified: Secondary | ICD-10-CM

## 2020-05-03 DIAGNOSIS — R0609 Other forms of dyspnea: Secondary | ICD-10-CM

## 2020-05-16 ENCOUNTER — Ambulatory Visit (INDEPENDENT_AMBULATORY_CARE_PROVIDER_SITE_OTHER): Payer: Medicare HMO

## 2020-05-16 ENCOUNTER — Other Ambulatory Visit: Payer: Self-pay

## 2020-05-16 DIAGNOSIS — I482 Chronic atrial fibrillation, unspecified: Secondary | ICD-10-CM | POA: Diagnosis not present

## 2020-05-16 DIAGNOSIS — Z5181 Encounter for therapeutic drug level monitoring: Secondary | ICD-10-CM | POA: Diagnosis not present

## 2020-05-16 DIAGNOSIS — I4891 Unspecified atrial fibrillation: Secondary | ICD-10-CM | POA: Diagnosis not present

## 2020-05-16 DIAGNOSIS — I059 Rheumatic mitral valve disease, unspecified: Secondary | ICD-10-CM

## 2020-05-16 DIAGNOSIS — Z9889 Other specified postprocedural states: Secondary | ICD-10-CM | POA: Diagnosis not present

## 2020-05-16 LAB — POCT INR: INR: 3.3 — AB (ref 2.0–3.0)

## 2020-05-16 NOTE — Patient Instructions (Signed)
-   continue dosage of warfarin of 1/2 tablet every day EXCEPT 1 WHOLE TABLET ON [place].  - Recheck in 3 weeks  * HAVE YOUR GREENS ON A SCHEDULE

## 2020-05-24 ENCOUNTER — Other Ambulatory Visit: Payer: Self-pay | Admitting: *Deleted

## 2020-05-24 DIAGNOSIS — E669 Obesity, unspecified: Secondary | ICD-10-CM

## 2020-05-24 DIAGNOSIS — R7309 Other abnormal glucose: Secondary | ICD-10-CM

## 2020-05-24 DIAGNOSIS — E7800 Pure hypercholesterolemia, unspecified: Secondary | ICD-10-CM

## 2020-05-24 DIAGNOSIS — I272 Pulmonary hypertension, unspecified: Secondary | ICD-10-CM

## 2020-05-24 DIAGNOSIS — I482 Chronic atrial fibrillation, unspecified: Secondary | ICD-10-CM

## 2020-05-24 DIAGNOSIS — Z0000 Encounter for general adult medical examination without abnormal findings: Secondary | ICD-10-CM

## 2020-05-24 DIAGNOSIS — I10 Essential (primary) hypertension: Secondary | ICD-10-CM

## 2020-05-25 ENCOUNTER — Other Ambulatory Visit: Payer: Medicare HMO

## 2020-05-25 ENCOUNTER — Other Ambulatory Visit: Payer: Self-pay

## 2020-05-25 DIAGNOSIS — E7800 Pure hypercholesterolemia, unspecified: Secondary | ICD-10-CM | POA: Diagnosis not present

## 2020-05-25 DIAGNOSIS — E669 Obesity, unspecified: Secondary | ICD-10-CM | POA: Diagnosis not present

## 2020-05-25 DIAGNOSIS — I10 Essential (primary) hypertension: Secondary | ICD-10-CM | POA: Diagnosis not present

## 2020-05-25 DIAGNOSIS — Z0000 Encounter for general adult medical examination without abnormal findings: Secondary | ICD-10-CM | POA: Diagnosis not present

## 2020-05-25 DIAGNOSIS — I482 Chronic atrial fibrillation, unspecified: Secondary | ICD-10-CM | POA: Diagnosis not present

## 2020-05-25 DIAGNOSIS — I272 Pulmonary hypertension, unspecified: Secondary | ICD-10-CM | POA: Diagnosis not present

## 2020-05-25 DIAGNOSIS — R7309 Other abnormal glucose: Secondary | ICD-10-CM | POA: Diagnosis not present

## 2020-05-26 LAB — CBC WITH DIFFERENTIAL/PLATELET
Absolute Monocytes: 382 cells/uL (ref 200–950)
Basophils Absolute: 32 cells/uL (ref 0–200)
Basophils Relative: 0.7 %
Eosinophils Absolute: 51 cells/uL (ref 15–500)
Eosinophils Relative: 1.1 %
HCT: 36 % (ref 35.0–45.0)
Hemoglobin: 11.7 g/dL (ref 11.7–15.5)
Lymphs Abs: 984 cells/uL (ref 850–3900)
MCH: 28.5 pg (ref 27.0–33.0)
MCHC: 32.5 g/dL (ref 32.0–36.0)
MCV: 87.8 fL (ref 80.0–100.0)
MPV: 12.7 fL — ABNORMAL HIGH (ref 7.5–12.5)
Monocytes Relative: 8.3 %
Neutro Abs: 3151 cells/uL (ref 1500–7800)
Neutrophils Relative %: 68.5 %
Platelets: 158 10*3/uL (ref 140–400)
RBC: 4.1 10*6/uL (ref 3.80–5.10)
RDW: 13.2 % (ref 11.0–15.0)
Total Lymphocyte: 21.4 %
WBC: 4.6 10*3/uL (ref 3.8–10.8)

## 2020-05-26 LAB — COMPLETE METABOLIC PANEL WITH GFR
AG Ratio: 1.3 (calc) (ref 1.0–2.5)
ALT: 12 U/L (ref 6–29)
AST: 19 U/L (ref 10–35)
Albumin: 3.9 g/dL (ref 3.6–5.1)
Alkaline phosphatase (APISO): 92 U/L (ref 37–153)
BUN: 10 mg/dL (ref 7–25)
CO2: 30 mmol/L (ref 20–32)
Calcium: 8.9 mg/dL (ref 8.6–10.4)
Chloride: 103 mmol/L (ref 98–110)
Creat: 0.82 mg/dL (ref 0.50–0.99)
GFR, Est African American: 86 mL/min/{1.73_m2} (ref >=60)
GFR, Est Non African American: 75 mL/min/{1.73_m2} (ref >=60)
Globulin: 3 g/dL (calc) (ref 1.9–3.7)
Glucose, Bld: 105 mg/dL — ABNORMAL HIGH (ref 65–99)
Potassium: 3.4 mmol/L — ABNORMAL LOW (ref 3.5–5.3)
Sodium: 141 mmol/L (ref 135–146)
Total Bilirubin: 1 mg/dL (ref 0.2–1.2)
Total Protein: 6.9 g/dL (ref 6.1–8.1)

## 2020-05-26 LAB — LIPID PANEL
Cholesterol: 134 mg/dL (ref <200)
HDL: 45 mg/dL — ABNORMAL LOW (ref >=50)
LDL Cholesterol (Calc): 70 mg/dL (calc)
Non-HDL Cholesterol (Calc): 89 mg/dL (calc) (ref <130)
Total CHOL/HDL Ratio: 3 (calc) (ref <5.0)
Triglycerides: 103 mg/dL (ref <150)

## 2020-05-26 LAB — HEMOGLOBIN A1C
Hgb A1c MFr Bld: 5.4 % of total Hgb (ref <5.7)
Mean Plasma Glucose: 108 (calc)
eAG (mmol/L): 6 (calc)

## 2020-05-26 LAB — TSH: TSH: 0.24 mIU/L — ABNORMAL LOW (ref 0.40–4.50)

## 2020-06-01 ENCOUNTER — Encounter: Payer: Self-pay | Admitting: Family Medicine

## 2020-06-01 ENCOUNTER — Ambulatory Visit (INDEPENDENT_AMBULATORY_CARE_PROVIDER_SITE_OTHER): Payer: Medicare HMO | Admitting: Family Medicine

## 2020-06-01 ENCOUNTER — Other Ambulatory Visit: Payer: Self-pay

## 2020-06-01 ENCOUNTER — Other Ambulatory Visit: Payer: Self-pay | Admitting: Family Medicine

## 2020-06-01 VITALS — BP 136/80 | HR 85 | Temp 96.6°F | Resp 16 | Ht 64.0 in | Wt 176.0 lb

## 2020-06-01 DIAGNOSIS — I482 Chronic atrial fibrillation, unspecified: Secondary | ICD-10-CM | POA: Diagnosis not present

## 2020-06-01 DIAGNOSIS — Z0000 Encounter for general adult medical examination without abnormal findings: Secondary | ICD-10-CM | POA: Diagnosis not present

## 2020-06-01 DIAGNOSIS — E669 Obesity, unspecified: Secondary | ICD-10-CM

## 2020-06-01 DIAGNOSIS — R7309 Other abnormal glucose: Secondary | ICD-10-CM

## 2020-06-01 DIAGNOSIS — Z952 Presence of prosthetic heart valve: Secondary | ICD-10-CM | POA: Diagnosis not present

## 2020-06-01 DIAGNOSIS — M25512 Pain in left shoulder: Secondary | ICD-10-CM

## 2020-06-01 DIAGNOSIS — E876 Hypokalemia: Secondary | ICD-10-CM

## 2020-06-01 DIAGNOSIS — I10 Essential (primary) hypertension: Secondary | ICD-10-CM

## 2020-06-01 DIAGNOSIS — E7800 Pure hypercholesterolemia, unspecified: Secondary | ICD-10-CM

## 2020-06-01 DIAGNOSIS — R7989 Other specified abnormal findings of blood chemistry: Secondary | ICD-10-CM

## 2020-06-01 DIAGNOSIS — I272 Pulmonary hypertension, unspecified: Secondary | ICD-10-CM

## 2020-06-01 DIAGNOSIS — Z8639 Personal history of other endocrine, nutritional and metabolic disease: Secondary | ICD-10-CM

## 2020-06-01 DIAGNOSIS — E559 Vitamin D deficiency, unspecified: Secondary | ICD-10-CM | POA: Diagnosis not present

## 2020-06-01 DIAGNOSIS — G8929 Other chronic pain: Secondary | ICD-10-CM

## 2020-06-01 MED ORDER — METHOCARBAMOL 500 MG PO TABS: 500.0000 mg | ORAL_TABLET | Freq: Four times a day (QID) | ORAL | 3 refills | Status: DC | PRN

## 2020-06-01 MED ORDER — LISINOPRIL 40 MG PO TABS: 40.0000 mg | ORAL_TABLET | Freq: Every day | ORAL | 3 refills | Status: DC

## 2020-06-01 NOTE — Progress Notes (Signed)
Subjective:    Patient ID: [name], female    DOB: [birth year], 64 y.o.   MRN: [id number]  [name] is a 64 y.o. female presenting on 06/01/2020 for Annual Exam   HPI   Here for Annual Physical and Lab Review.  Elevated A1c/ Obesity BMI >30 - No prior history of Diabetes Improved A1c down to 5.4 on last lab Lifestyle: Improved weight - Diet:No particular diet. Reducing tea intake, working on improving water - Exercise: improving gradual  CHRONIC HTN: Reportsno new concerns, some recent SBP readings130s avg. Elevated BP initially. Current Meds -Lisinopril 29m daily, Metoprolol XL 1063mdaily Reports good compliance, took meds today. Tolerating well, w/o complaints. Denies CP, dyspnea, HA, edema, dizziness / lightheadedness  Allergic Rhinitis Controlled on Cetirizine / Flonase, has refills  Chronic Atrial Fibrillation, s/p Mitral Valve Replacement, chronic anticoagulation Pulmonary HTN / PAD - [facility] Cr[facility]ford Breedcontinues to f/u with [facility], she had ECHO on 11/12/17 with normal LVEF and mild mod pulm HTN, lasix 2074mvery other day was added. Otherwise remains on anticoagulation with coumadin for s/p MVR, rate control for permanent AFib. - They said her heart valve is expired but functioning well  HYPERLIPIDEMIA: - Reports no concerns. Last lipid panel 05/2020, controlled  - Currently taking Atorvastatin 14m7molerating well without side effects or myalgias  Mild Anemia, normocytic Hemoglobin mild low at 11.5 previously, has improved now.  Had episode of prolonged bleeding after dental extractions, otherwise doing well, no other bleeding concerns. Denies any fatigue or dyspnea or chest pain or symptoms.   Updates  Received steroid shot in Left shoulder improved, likely caused by cervical spinal disc and neck/back causing radiating symptoms.   Health Maintenance  Due for colon CA screening, never  received cologuard kit, will call to check status Due for initial pneumonia vaccine at age 41 -42ill return to receive Prevnar-13 soon  UTD Flu Vaccine    Depression screen PHQ [facility] 06/01/2020 12/01/2019 11/07/2019  Decreased Interest 0 0 0  Down, Depressed, Hopeless 0 0 0  PHQ - 2 Score 0 0 0  Altered sleeping - - -  Tired, decreased energy - - -  Change in appetite - - -  Feeling bad or failure about yourself  - - -  Trouble concentrating - - -  Moving slowly or fidgety/restless - - -  Suicidal thoughts - - -  PHQ-9 Score - - -  Difficult doing work/chores - - -  Some recent data might be hidden    Past Medical History:  Diagnosis Date  . Anticoagulated on warfarin   . Arthritis   . Chronic atrial fibrillation (HCC)[place] SINCE 1997  . Dysrhythmia   . Goiter    multinodular  . Heart murmur   . History of hyperthyroidism    2003--  PTU [place]  . Hypercholesterolemia   . Hyperlipidemia   . Hyperthyroidism    H/O  . Pulmonary hypertension, mild (HCC)[place]. Rheumatic heart disease    CARDIOLOGIST-- DR THOMMarcello MooresL  . S/P MVR (mitral valve replacement)    [place]  [facility]--  SECONDARY TO SEVERE MV STENOSIS   Past Surgical History:  Procedure Laterality Date  . ANTERIOR CERVICAL DECOMP/DISCECTOMY FUSION  12-21-2001   C5 --  C6  . BREAST BIOPSY Left 11/03/2016   Procedure: BREAST BIOPSY WITH NEEDLE LOCALIZATION;  Surgeon: ByrnRobert Bellow;  Location: [facility] ORS;  Service: General;  Laterality: Left;  . BREAST EXCISIONAL BIOPSY Left 2018  . CARDIAC CATHETERIZATION  12-28-2006  [facility]   NORMAL CORONARY ARTERIES  . CARPAL TUNNEL RELEASE Right 01-20-2007  . COLONOSCOPY  2015  . ERCP  08/15/2011   Procedure: ENDOSCOPIC RETROGRADE CHOLANGIOPANCREATOGRAPHY (ERCP);  Surgeon: Jeryl Columbia, MD;  Location: [facility] ENDOSCOPY;  Service: Endoscopy;  Laterality: N/A;  . ERCP     MULTIPLE--  INCLUDING STENTS, SPHINTEROTOMY'S  STONE REMOVAL   . FEMUR IM NAIL Right 04/26/2014   Procedure:  INTRAMEDULLARY (IM) NAIL FEMORAL;  Surgeon: Alta Corning, MD;  Location: [place];  Service: Orthopedics;  Laterality: Right;  . I & D EXTREMITY Right 02/07/2013   Procedure: IRRIGATION AND DEBRIDEMENT OF RIGHT LOWER LEG WITH PLACEMENT OF ACELL AND WOULND VAC;  Surgeon: Theodoro Kos, DO;  Location: [place];  Service: Plastics;  Laterality: Right;  . LAPAROSCOPIC CHOLECYSTECTOMY  04-08-2002  . MITRAL VALVE REPLACEMENT  1997   [facility] [place]  . ORIF ANKLE FRACTURE Right 11/05/2012   Procedure: OPEN REDUCTION INTERNAL FIXATION (ORIF) ANKLE FRACTURE only operating on right;  Surgeon: Alta Corning, MD;  Location: [place];  Service: Orthopedics;  Laterality: Right;  . ORIF RIGHT ULNAR FX W/ ILIAC CREST BONE GRAFT  10-06-2008  . ORIF WRIST FRACTURE Right 04/26/2014   Procedure: OPEN REDUCTION INTERNAL FIXATION (ORIF) WRIST FRACTURE;  Surgeon: Alta Corning, MD;  Location: [place];  Service: Orthopedics;  Laterality: Right;  . TRANSTHORACIC ECHOCARDIOGRAM  06-29-2009  dr wall   normal lvsf/ ef 55-60%/  normal bileaflet mechanical mvr/ moderated dilated la/ moderate tr  . WRIST ARTHROSCOPY Right 12-10-2007   debridement and ulnar shortening osteotomy w/ plate   Social History   Socioeconomic History  . Marital status: Divorced    Spouse name: Not on file  . Number of children: 0  . Years of education: 6  . Highest education level: 12th grade  Occupational History  . Occupation: Control and instrumentation engineer    Comment: retired  Tobacco Use  . Smoking status: Never Smoker  . Smokeless tobacco: Never Used  Vaping Use  . Vaping Use: Never used  Substance and Sexual Activity  . Alcohol use: No  . Drug use: No  . Sexual activity: Not on file  Other Topics Concern  . Not on file  Social History Narrative  . Not on file   Social Determinants of Health   Financial Resource Strain: Low Risk   . Difficulty of Paying Living Expenses: Not very hard  Food Insecurity: No Food Insecurity  . Worried  About Charity fundraiser in the Last Year: Never true  . Ran Out of Food in the Last Year: Never true  Transportation Needs: No Transportation Needs  . Lack of Transportation (Medical): No  . Lack of Transportation (Non-Medical): No  Physical Activity: Inactive  . Days of Exercise per Week: 0 days  . Minutes of Exercise per Session: 0 min  Stress: No Stress Concern Present  . Feeling of Stress : Not at all  Social Connections: Moderately Integrated  . Frequency of Communication with Friends and Family: More than three times a week  . Frequency of Social Gatherings with Friends and Family: More than three times a week  . Attends Religious Services: More than 4 times per year  . Active Member of Clubs or Organizations: Yes  . Attends Archivist Meetings: More than 4 times per year  . Marital Status: Divorced  Human resources officer  Violence: Not At Risk  . Fear of Current or Ex-Partner: No  . Emotionally Abused: No  . Physically Abused: No  . Sexually Abused: No   Family History  Problem Relation Age of Onset  . Hypothyroidism Sister   . Gallbladder disease Sister   . Coronary artery disease Other   . Diabetes Mother   . Hypertension Mother   . Heart disease Father   . Stroke Father   . Breast cancer Neg Hx    Current Outpatient Medications on File Prior to Visit  Medication Sig  . acyclovir (ZOVIRAX) 400 MG tablet TAKE 1 TABLET BY MOUTH 3 TIMES DAILY FORFIVE TO TEN DAYS OR UNTIL HEALED. FOR FEVER BLISTER.  Marland Kitchen alendronate (FOSAMAX) 35 MG tablet TAKE 1 TABLET BY MOUTH EVERY 7 DAYS. TAKE WITH A FULL GLASS OF WATER ON AN EMPTY STOMACH.  Marland Kitchen amitriptyline (ELAVIL) 50 MG tablet Take 50 mg by mouth at bedtime as needed for sleep.   Marland Kitchen aspirin EC 81 MG tablet Take 1 tablet (81 mg total) by mouth daily.  Marland Kitchen atorvastatin (LIPITOR) 40 MG tablet TAKE 1 TABLET BY MOUTH ONCE EVERY EVENING  . calcium carbonate (OSCAL) 1500 (600 Ca) MG TABS tablet Take 1 tablet by mouth daily.   . cetirizine  (ZYRTEC) 10 MG tablet TAKE 1 TABLET BY MOUTH ONCE A DAY  . Cholecalciferol (VITAMIN D3) 2000 units capsule Take 1 capsule (2,000 Units total) by mouth daily.  . cycloSPORINE (RESTASIS) 0.05 % ophthalmic emulsion Place 1 drop into both eyes 2 (two) times daily as needed (dry eyes).  Marland Kitchen dicyclomine (BENTYL) 10 MG capsule TAKE 1 CAPSULE BY MOUTH 4 TIMES A DAY BEFORE MEALS AND AT BEDTIME.  Marland Kitchen enoxaparin (LOVENOX) 80 MG/0.8ML injection Inject 0.8 mLs (80 mg total) into the skin every 12 (twelve) hours.  . fluticasone (FLONASE) 50 MCG/ACT nasal spray PLACE 2 SPRAYS INTO BOTH NOSTRILS DAILY  . furosemide (LASIX) 20 MG tablet TAKE 1 TABLET BY MOUTH EVERY OTHER DAY  . gabapentin (NEURONTIN) 100 MG capsule   . ibuprofen (ADVIL,MOTRIN) 200 MG tablet Take 200 mg by mouth 2 (two) times daily as needed for headache or moderate pain.  . metoprolol succinate (TOPROL-XL) 100 MG 24 hr tablet TAKE 1 TABLET BY MOUTH ONCE A DAY ** TAKE WITH OR IMMEDIATELY FOLLOWING A MEAL **  . Multiple Vitamins-Minerals (CENTRUM SILVER ADULT 50+ PO) Take 1 tablet by mouth daily.  Marland Kitchen warfarin (COUMADIN) 5 MG tablet TAKE 1 TABLET BY MOUTH ONCE A DAY OR AS DIRECTED BY COUMADIN CLINIC.   No current facility-administered medications on file prior to visit.    Review of Systems  Constitutional: Negative for activity change, appetite change, chills, diaphoresis, fatigue and fever.  HENT: Negative for congestion and hearing loss.   Eyes: Negative for visual disturbance.  Respiratory: Negative for cough, chest tightness, shortness of breath and wheezing.   Cardiovascular: Negative for chest pain, palpitations and leg swelling.  Gastrointestinal: Negative for abdominal pain, constipation, diarrhea, nausea and vomiting.  Genitourinary: Negative for dysuria, frequency and hematuria.  Musculoskeletal: Negative for arthralgias and neck pain.  Skin: Negative for rash.  Allergic/Immunologic: Negative for environmental allergies.  Neurological:  Negative for dizziness, weakness, light-headedness, numbness and headaches.  Hematological: Negative for adenopathy.  Psychiatric/Behavioral: Negative for behavioral problems, dysphoric mood and sleep disturbance.   Per HPI unless specifically indicated above      Objective:    BP 136/80 (BP Location: Left Arm, Cuff Size: Normal)   Pulse 85  Temp (!) 96.6 F (35.9 C) (Temporal)   Resp 16   Ht 5' 4" (1.626 m)   Wt 176 lb (79.8 kg)   SpO2 100%   BMI 30.21 kg/m   Wt Readings from Last 3 Encounters:  06/01/20 176 lb (79.8 kg)  02/20/20 173 lb 9.6 oz (78.7 kg)  12/01/19 179 lb 9.6 oz (81.5 kg)    Physical Exam Vitals and nursing note reviewed.  Constitutional:      General: She is not in acute distress.    Appearance: She is well-developed and well-nourished. She is not diaphoretic.     Comments: Well-appearing, comfortable, cooperative  HENT:     Head: Normocephalic and atraumatic.     Mouth/Throat:     Mouth: Oropharynx is clear and moist.  Eyes:     General:        Right eye: No discharge.        Left eye: No discharge.     Extraocular Movements: EOM normal.     Conjunctiva/sclera: Conjunctivae normal.     Pupils: Pupils are equal, round, and reactive to light.  Neck:     Thyroid: No thyromegaly.     Vascular: No carotid bruit.  Cardiovascular:     Rate and Rhythm: Normal rate.     Pulses: Intact distal pulses.     Heart sounds: Normal heart sounds. No murmur heard.     Comments:  Irregularly irregular rhythm. Mechanical heart valve clicking  Pulmonary:     Effort: Pulmonary effort is normal. No respiratory distress.     Breath sounds: Normal breath sounds. No wheezing or rales.  Abdominal:     General: Bowel sounds are normal. There is no distension.     Palpations: Abdomen is soft. There is no mass.     Tenderness: There is no abdominal tenderness.  Musculoskeletal:        General: No tenderness or edema. Normal range of motion.     Cervical back: Normal  range of motion and neck supple.     Right lower leg: No edema.     Left lower leg: No edema.     Comments: Upper / Lower Extremities: - Normal muscle tone, strength bilateral upper extremities 5/5, lower extremities 5/5  Lymphadenopathy:     Cervical: No cervical adenopathy.  Skin:    General: Skin is warm and dry.     Findings: No erythema or rash.  Neurological:     Mental Status: She is alert and oriented to person, place, and time.     Comments: Distal sensation intact to light touch all extremities  Psychiatric:        Mood and Affect: Mood and affect normal.        Behavior: Behavior normal.     Comments: Well groomed, good eye contact, normal speech and thoughts       Results for orders placed or performed in visit on 05/24/20  TSH  Result Value Ref Range   TSH 0.24 (L) 0.40 - 4.50 mIU/L  Lipid panel  Result Value Ref Range   Cholesterol 134 <200 mg/dL   HDL 45 (L) > OR = 50 mg/dL   Triglycerides 103 <150 mg/dL   LDL Cholesterol (Calc) 70 mg/dL (calc)   Total CHOL/HDL Ratio 3.0 <5.0 (calc)   Non-HDL Cholesterol (Calc) 89 <130 mg/dL (calc)  COMPLETE METABOLIC PANEL WITH GFR  Result Value Ref Range   Glucose, Bld 105 (H) 65 - 99 mg/dL   BUN 10  7 - 25 mg/dL   Creat 0.82 0.50 - 0.99 mg/dL   GFR, Est Non African American 75 > OR = 60 mL/min/1.39m   GFR, Est African American 86 > OR = 60 mL/min/1.767m  BUN/Creatinine Ratio NOT APPLICABLE 6 - 22 (calc)   Sodium 141 135 - 146 mmol/L   Potassium 3.4 (L) 3.5 - 5.3 mmol/L   Chloride 103 98 - 110 mmol/L   CO2 30 20 - 32 mmol/L   Calcium 8.9 8.6 - 10.4 mg/dL   Total Protein 6.9 6.1 - 8.1 g/dL   Albumin 3.9 3.6 - 5.1 g/dL   Globulin 3.0 1.9 - 3.7 g/dL (calc)   AG Ratio 1.3 1.0 - 2.5 (calc)   Total Bilirubin 1.0 0.2 - 1.2 mg/dL   Alkaline phosphatase (APISO) 92 37 - 153 U/L   AST 19 10 - 35 U/L   ALT 12 6 - 29 U/L  CBC with Differential/Platelet  Result Value Ref Range   WBC 4.6 3.8 - 10.8 Thousand/uL   RBC 4.10  3.80 - 5.10 Million/uL   Hemoglobin 11.7 11.7 - 15.5 g/dL   HCT 36.0 35.0 - 45.0 %   MCV 87.8 80.0 - 100.0 fL   MCH 28.5 27.0 - 33.0 pg   MCHC 32.5 32.0 - 36.0 g/dL   RDW 13.2 11.0 - 15.0 %   Platelets 158 140 - 400 Thousand/uL   MPV 12.7 (H) 7.5 - 12.5 fL   Neutro Abs 3,151 1,500 - 7,800 cells/uL   Lymphs Abs 984 850 - 3,900 cells/uL   Absolute Monocytes 382 200 - 950 cells/uL   Eosinophils Absolute 51 15 - 500 cells/uL   Basophils Absolute 32 0 - 200 cells/uL   Neutrophils Relative % 68.5 %   Total Lymphocyte 21.4 %   Monocytes Relative 8.3 %   Eosinophils Relative 1.1 %   Basophils Relative 0.7 %  Hemoglobin A1c  Result Value Ref Range   Hgb A1c MFr Bld 5.4 <5.7 % of total Hgb   Mean Plasma Glucose 108 (calc)   eAG (mmol/L) 6.0 (calc)      Assessment & Plan:   Problem List Items Addressed This Visit    Vitamin D deficiency   S/P MVR (mitral valve replacement)    [facility]      [place] ([facility] [place]   Stable history of mild pulm HTN Followed by Cardiology, with MVR complication      Relevant Medications   lisinopril (ZESTRIL) 40 MG tablet   Obesity (BMI 30.0-34.9)   HYPERCHOLESTEROLEMIA    Controlled cholesterol on statin and lifestyle Last lipid panel 05/2020  Plan: 1. Continue current meds - Atorvastatin 4021m. Encourage improved lifestyle - low carb/cholesterol, reduce portion size, continue improving regular exercise      Relevant Medications   lisinopril (ZESTRIL) 40 MG tablet   History of hyperthyroidism    Mild low TSH. Repeat 6 month      Essential hypertension    Controlled HTN repeat manual improved, stressed this AM - Home BP readings stable  Complicated by chronic Atrial Fibrillation, MVR, Pulm HTN    Plan:  1. Continue current BP regimen Lisinopril 43m38mily, Metoprolol XL 100mg12mly 2. Encourage improved lifestyle - low sodium diet, regular exercise 3.  Continue monitor BP outside office, bring readings to next visit, if persistently >140/90 or new symptoms notify office sooner      Relevant Medications   lisinopril (ZESTRIL)  40 MG tablet   Elevated hemoglobin A1c    Stable A1c at 5.4 Below PreDM Not on any therapy currently - remain off Encourage improved lifestyle - low carb, low sugar diet, limit sweet tea intake, reduce portion size, continue improving regular exercise      Chronic atrial fibrillation (HCC)    Stable AFib, without recurrence RVR. On anticoagulation Followed by [facility] Cardiology on anticoagulation Coumadin clinic Continue rate control Metoprolol      Relevant Medications   lisinopril (ZESTRIL) 40 MG tablet    Other Visit Diagnoses    Annual physical exam    -  Primary   Chronic left shoulder pain       Relevant Medications   methocarbamol (ROBAXIN) 500 MG tablet      Updated Health Maintenance information Reviewed recent lab results with patient Encouraged improvement to lifestyle with diet and exercise - Goal of weight loss   Meds ordered this encounter  Medications  . lisinopril (ZESTRIL) 40 MG tablet    Sig: Take 1 tablet (40 mg total) by mouth daily.    Dispense:  90 tablet    Refill:  3  . methocarbamol (ROBAXIN) 500 MG tablet    Sig: Take 1 tablet (500 mg total) by mouth 4 (four) times daily as needed for muscle spasms (pain).    Dispense:  30 tablet    Refill:  3    Follow up plan:  Return in about 6 months (around 11/30/2020) for 6 month fasting lab only then 1 week later Follow-up Lab results Thyroid, Potassium, HTN.  Future 6 month labs BMET + TSH Free T4  Nobie Putnam, DO [facility] 06/01/2020, 9:21 AM

## 2020-06-01 NOTE — Assessment & Plan Note (Signed)
Mild low TSH. Repeat 6 month

## 2020-06-01 NOTE — Patient Instructions (Addendum)
Thank you for coming to the office today.  CALL [facility] to see if they can send a new one to your other address  [place],  [id number]  24/7 telephone support at [phone].  Slightly low potassium at 3.4, keep improving balanced diet if possible.  Recheck in 6 months.  Heart valve sounds good!  DUE for FASTING BLOOD WORK (no food or drink after midnight before the lab appointment, only water or coffee without cream/sugar on the morning of)  SCHEDULE "Lab Only" visit in the morning at the clinic for lab draw in 6 MONTHS   - Make sure Lab Only appointment is at about 1 week before your next appointment, so that results will be available  For Lab Results, once available within 2-3 days of blood draw, you can can log in to MyChart online to view your results and a brief explanation. Also, we can discuss results at next follow-up visit.    Please schedule a Follow-up Appointment to: Return in about 6 months (around 11/30/2020) for 6 month fasting lab only then 1 week later Follow-up Lab results Thyroid, Potassium, HTN.  If you have any other questions or concerns, please feel free to call the office or send a message through [place]. You may also schedule an earlier appointment if necessary.  Additionally, you may be receiving a survey about your experience at our office within a few days to 1 week by e-mail or mail. We value your feedback.  Nobie Putnam, DO [place]

## 2020-06-01 NOTE — Assessment & Plan Note (Signed)
Controlled cholesterol on statin and lifestyle Last lipid panel 05/2020  Plan: 1. Continue current meds - Atorvastatin 40mg  2. Encourage improved lifestyle - low carb/cholesterol, reduce portion size, continue improving regular exercise

## 2020-06-01 NOTE — Assessment & Plan Note (Signed)
Stable A1c at 5.4 Below PreDM Not on any therapy currently - remain off Encourage improved lifestyle - low carb, low sugar diet, limit sweet tea intake, reduce portion size, continue improving regular exercise

## 2020-06-01 NOTE — Assessment & Plan Note (Signed)
Controlled HTN repeat manual improved, stressed this AM - Home BP readings stable  Complicated by chronic Atrial Fibrillation, MVR, Pulm HTN    Plan:  1. Continue current BP regimen Lisinopril 40mg  daily, Metoprolol XL 100mg  daily 2. Encourage improved lifestyle - low sodium diet, regular exercise 3. Continue monitor BP outside office, bring readings to next visit, if persistently >140/90 or new symptoms notify office sooner

## 2020-06-01 NOTE — Assessment & Plan Note (Signed)
Stable AFib, without recurrence RVR. On anticoagulation Followed by [facility] on anticoagulation Coumadin clinic Continue rate control Metoprolol 

## 2020-06-01 NOTE — Assessment & Plan Note (Signed)
Stable history of mild pulm HTN Followed by Cardiology, with MVR complication

## 2020-06-01 NOTE — Assessment & Plan Note (Signed)
Stable Followed by [facility] Cardiology On chronic anticoagulation, Coumadin clinic

## 2020-06-04 ENCOUNTER — Telehealth: Payer: Self-pay | Admitting: General Practice

## 2020-06-04 ENCOUNTER — Ambulatory Visit: Payer: Self-pay | Admitting: General Practice

## 2020-06-04 DIAGNOSIS — M25522 Pain in left elbow: Secondary | ICD-10-CM

## 2020-06-04 DIAGNOSIS — E7800 Pure hypercholesterolemia, unspecified: Secondary | ICD-10-CM

## 2020-06-04 DIAGNOSIS — I482 Chronic atrial fibrillation, unspecified: Secondary | ICD-10-CM

## 2020-06-04 DIAGNOSIS — I10 Essential (primary) hypertension: Secondary | ICD-10-CM

## 2020-06-04 NOTE — Patient Instructions (Signed)
Visit Information  Goals Addressed              This Visit's Progress   .  RNCM: Pt- "I could do better with the salt in my diet" (pt-stated)        CARE PLAN ENTRY (see longtitudinal plan of care for additional care plan information)  Current Barriers:  . Chronic Disease Management support, education, and care coordination needs related to Atrial Fibrillation, HTN, and HLD  Clinical Goal(s) related to Atrial Fibrillation, HTN, and HLD:  Over the next 120days, patient will:  . Work with the care management team to address educational, disease management, and care coordination needs  . Begin or continue self health monitoring activities as directed today Measure and record blood pressure 5 times per week and adhere to a heart healthy diet . Call provider office for new or worsened signs and symptoms Blood pressure findings outside established parameters, Chest pain, and New or worsened symptom related to AFIB/HLD and other chronic conditions  . Call care management team with questions or concerns . Verbalize basic understanding of patient centered plan of care established today  Interventions related to Atrial Fibrillation, HTN, and HLD:  . Evaluation of current treatment plans and patient's adherence to plan as established by provider 06-04-2020: the patient feels she is doing well at taking care of her HTN, AFIB, and HLD. The patient says her blood pressures are good. She is having issues with her INR and having to have it checked more frequently. She states they want it to be between 3.0 and 3.5, however hers has been thinner.  Education and support given. Saw pcp on 06-01-2020 and received a good report. . Assessed patient understanding of disease states.  The patient has a good understanding of her chronic conditions and how to effectively manage.  06-04-2020: The patient has been experiencing some elbow pain but is working with the provider and has had shots in her elbow and shoulder  that have helped. Is having a flare up of elbow pain. Heat helps. Will call the orthopedic provider to schedule an appointment for evaluation and treatment. . Assessed patient's education and care coordination needs.  The patient does not have an AD/LW- written information sent via mail to the patient today on AD/LW . Provided disease specific education to patient.  Discussed heart healthy diet- will send information via my Chart system and EMMI on DASH diet. 06-04-2020: The patients states that since being intubated in 2010 she hates food. She eats but it is not her favorite thing to do. The patient states that she eats and stays hydrated but does not have issues with maintaining her weight or over eating. Endorses following sodium restrictions.  Nash Dimmer with appropriate clinical care team members regarding patient needs.  Review of LCSW and pharmacist available for needs. Denies any needs at this time.  . Evaluation of next appointment with the pcp: 12-05-2020 at 0800  Patient Self Care Activities related to Atrial Fibrillation, HTN, and HLD:  . Patient is unable to independently self-manage chronic health conditions  Initial goal documentation and Please see past updates related to this goal by clicking on the "Past Updates" button in the selected goal      .  RNCM: Pt-"I am still having a lot of problems with my elbow" (pt-stated)        CARE PLAN ENTRY (see longitudinal plan of care for additional care plan information)  Current Barriers:  Marland Kitchen Knowledge Deficits related to pain  in left elbow.  Per the patient the only thing that makes it feel better is "heat"  Nurse Case Manager Clinical Goal(s):  Marland Kitchen Over the next 120 days, patient will verbalize understanding of plan for finding the etiology of elbow pain and having a plan of care to help with relief of pain. . Over the next 120 days, patient will work with RNCM, pcp, and specialist  to address needs related to unrelieved elbow pain.   . Over the next 120 days, patient will attend all scheduled medical appointments: sees specialist for elbow pain on 01-24-2020. Marland Kitchen Over the next 120 days, patient will demonstrate improved adherence to prescribed treatment plan for pain relief of left elbow  as evidenced byreduced pain and discomfort.   Interventions:  . Inter-disciplinary care team collaboration (see longitudinal plan of care) . Evaluation of current treatment plan related to pain in left elbow and patient's adherence to plan as established by provider. 06-04-2020: The patient had shots in her elbow and in her shoulder and the shot in her shoulder helped her pain more than the shot in her elbow. She states the doctor feels it is because of arthritis and where she had neck surgery. The patient also states that the weather change exacerbates it.  The patient states she is having a flare up.  Using heat to help. . Advised patient to call the provider for worsening level or intensity of pain.  06-04-2020: The patient is having a flare up and says she is going to call the orthopedic provider and get an appointment for evaluation. . Provided education to patient re: factors that may exacerbate pain and alternative pain relief measures. 06-04-2020: the patient is using the volteran gel to help when she is having pain flare ups also heat helps also. . Reviewed medications with patient and discussed the patient states the ultram has not helped with her pain at all and the brace that she got has not been beneficial either. 06-04-2020: The patient states the cortisone shots in her elbow and shoulder have been most beneficial for the elbow pain. Is going to schedule an appointment to be evaluated soon by orthopedic provider. Marland Kitchen Collaborated with pcp  regarding unresolved pain in her left elbow. Saw pcp on 06-01-2012. Is also going to get appointment with the orthopedic provider. . Discussed plans with patient for ongoing care management follow up and  provided patient with direct contact information for care management team . Reviewed scheduled/upcoming provider appointments including: Does not see the pcp again until 12-05-2020 at 0800 am.  Patient Self Care Activities:  . Patient verbalizes understanding of plan to work with specialist, pcp and RNCM to help with pain in left elbow.  . Attends all scheduled provider appointments . Calls provider office for new concerns or questions . Unable to independently mange chronic pain and discomfort.   Please see past updates related to this goal by clicking on the "Past Updates" button in the selected goal         The patient verbalized understanding of instructions, educational materials, and care plan provided today and declined offer to receive copy of patient instructions, educational materials, and care plan.   Telephone follow up appointment with care management team member scheduled for: 08-06-2020 at 0900 am  Noreene Larsson RN, MSN, [place] [address] Mobile: [phone]

## 2020-06-04 NOTE — Chronic Care Management (AMB) (Signed)
Chronic Care Management   Follow Up Note   06/04/2020 Name: [name] MRN: [id number] DOB: [birth year]  Referred by: Olin Hauser, DO Reason for referral : Chronic Care Management (RNCM: Chronic Disease Management and Care Coordination Needs)   [name] is a 64 y.o. year old female who is a primary care patient of Olin Hauser, DO. The CCM team was consulted for assistance with chronic disease management and care coordination needs.    Review of patient status, including review of consultants reports, relevant laboratory and other test results, and collaboration with appropriate care team members and the patient's provider was performed as part of comprehensive patient evaluation and provision of chronic care management services.    SDOH (Social Determinants of Health) assessments performed: Yes See Care Plan activities for detailed interventions related to [facility])     Outpatient Encounter Medications as of 06/04/2020  Medication Sig   acyclovir (ZOVIRAX) 400 MG tablet TAKE 1 TABLET BY MOUTH 3 TIMES DAILY FORFIVE TO TEN DAYS OR UNTIL HEALED. FOR FEVER BLISTER.   alendronate (FOSAMAX) 35 MG tablet TAKE 1 TABLET BY MOUTH EVERY 7 DAYS. TAKE WITH A FULL GLASS OF WATER ON AN EMPTY STOMACH.   amitriptyline (ELAVIL) 50 MG tablet Take 50 mg by mouth at bedtime as needed for sleep.    aspirin EC 81 MG tablet Take 1 tablet (81 mg total) by mouth daily.   atorvastatin (LIPITOR) 40 MG tablet TAKE 1 TABLET BY MOUTH ONCE EVERY EVENING   calcium carbonate (OSCAL) 1500 (600 Ca) MG TABS tablet Take 1 tablet by mouth daily.    cetirizine (ZYRTEC) 10 MG tablet TAKE 1 TABLET BY MOUTH ONCE A DAY   Cholecalciferol (VITAMIN D3) 2000 units capsule Take 1 capsule (2,000 Units total) by mouth daily.   cycloSPORINE (RESTASIS) 0.05 % ophthalmic emulsion Place 1 drop into both eyes 2 (two) times daily as needed (dry eyes).   dicyclomine (BENTYL) 10 MG capsule TAKE 1  CAPSULE BY MOUTH 4 TIMES A DAY BEFORE MEALS AND AT BEDTIME.   enoxaparin (LOVENOX) 80 MG/0.8ML injection Inject 0.8 mLs (80 mg total) into the skin every 12 (twelve) hours.   fluticasone (FLONASE) 50 MCG/ACT nasal spray PLACE 2 SPRAYS INTO BOTH NOSTRILS DAILY   furosemide (LASIX) 20 MG tablet TAKE 1 TABLET BY MOUTH EVERY OTHER DAY   gabapentin (NEURONTIN) 100 MG capsule    ibuprofen (ADVIL,MOTRIN) 200 MG tablet Take 200 mg by mouth 2 (two) times daily as needed for headache or moderate pain.   lisinopril (ZESTRIL) 40 MG tablet Take 1 tablet (40 mg total) by mouth daily.   methocarbamol (ROBAXIN) 500 MG tablet Take 1 tablet (500 mg total) by mouth 4 (four) times daily as needed for muscle spasms (pain).   metoprolol succinate (TOPROL-XL) 100 MG 24 hr tablet TAKE 1 TABLET BY MOUTH ONCE A DAY ** TAKE WITH OR IMMEDIATELY FOLLOWING A MEAL **   Multiple Vitamins-Minerals (CENTRUM SILVER ADULT 50+ PO) Take 1 tablet by mouth daily.   warfarin (COUMADIN) 5 MG tablet TAKE 1 TABLET BY MOUTH ONCE A DAY OR AS DIRECTED BY COUMADIN CLINIC.   No facility-administered encounter medications on file as of 06/04/2020.     Objective:  BP Readings from Last 3 Encounters:  06/01/20 136/80  02/20/20 114/86  12/01/19 134/77    Goals Addressed              This Visit's Progress     RNCM: Pt- "I could do better  with the salt in my diet" (pt-stated)        CARE PLAN ENTRY (see longtitudinal plan of care for additional care plan information)  Current Barriers:   Chronic Disease Management support, education, and care coordination needs related to Atrial Fibrillation, HTN, and HLD  Clinical Goal(s) related to Atrial Fibrillation, HTN, and HLD:  Over the next 120days, patient will:   Work with the care management team to address educational, disease management, and care coordination needs   Begin or continue self health monitoring activities as directed today Measure and record blood pressure 5  times per week and adhere to a heart healthy diet  Call provider office for new or worsened signs and symptoms Blood pressure findings outside established parameters, Chest pain, and New or worsened symptom related to AFIB/HLD and other chronic conditions   Call care management team with questions or concerns  Verbalize basic understanding of patient centered plan of care established today  Interventions related to Atrial Fibrillation, HTN, and HLD:   Evaluation of current treatment plans and patient's adherence to plan as established by provider 06-04-2020: the patient feels she is doing well at taking care of her HTN, AFIB, and HLD. The patient says her blood pressures are good. She is having issues with her INR and having to have it checked more frequently. She states they want it to be between 3.0 and 3.5, however hers has been thinner.  Education and support given. Saw pcp on 06-01-2020 and received a good report.  Assessed patient understanding of disease states.  The patient has a good understanding of her chronic conditions and how to effectively manage.  06-04-2020: The patient has been experiencing some elbow pain but is working with the provider and has had shots in her elbow and shoulder that have helped. Is having a flare up of elbow pain. Heat helps. Will call the orthopedic provider to schedule an appointment for evaluation and treatment.  Assessed patient's education and care coordination needs.  The patient does not have an AD/LW- written information sent via mail to the patient today on AD/LW  Provided disease specific education to patient.  Discussed heart healthy diet- will send information via my Chart system and EMMI on DASH diet. 06-04-2020: The patients states that since being intubated in 2010 she hates food. She eats but it is not her favorite thing to do. The patient states that she eats and stays hydrated but does not have issues with maintaining her weight or over eating.  Endorses following sodium restrictions.   Collaborated with appropriate clinical care team members regarding patient needs.  Review of LCSW and pharmacist available for needs. Denies any needs at this time.   Evaluation of next appointment with the pcp: 12-05-2020 at 0800  Patient Self Care Activities related to Atrial Fibrillation, HTN, and HLD:   Patient is unable to independently self-manage chronic health conditions  Initial goal documentation and Please see past updates related to this goal by clicking on the "Past Updates" button in the selected goal        RNCM: Pt-"I am still having a lot of problems with my elbow" (pt-stated)        CARE PLAN ENTRY (see longitudinal plan of care for additional care plan information)  Current Barriers:   Knowledge Deficits related to pain in left elbow.  Per the patient the only thing that makes it feel better is "heat"  Nurse Case Manager Clinical Goal(s):   Over the next 120 days,  patient will verbalize understanding of plan for finding the etiology of elbow pain and having a plan of care to help with relief of pain.  Over the next 120 days, patient will work with RNCM, pcp, and specialist  to address needs related to unrelieved elbow pain.   Over the next 120 days, patient will attend all scheduled medical appointments: sees specialist for elbow pain on 01-24-2020.  Over the next 120 days, patient will demonstrate improved adherence to prescribed treatment plan for pain relief of left elbow  as evidenced byreduced pain and discomfort.   Interventions:   Inter-disciplinary care team collaboration (see longitudinal plan of care)  Evaluation of current treatment plan related to pain in left elbow and patient's adherence to plan as established by provider. 06-04-2020: The patient had shots in her elbow and in her shoulder and the shot in her shoulder helped her pain more than the shot in her elbow. She states the doctor feels it is because of  arthritis and where she had neck surgery. The patient also states that the weather change exacerbates it.  The patient states she is having a flare up.  Using heat to help.  Advised patient to call the provider for worsening level or intensity of pain.  06-04-2020: The patient is having a flare up and says she is going to call the orthopedic provider and get an appointment for evaluation.  Provided education to patient re: factors that may exacerbate pain and alternative pain relief measures. 06-04-2020: the patient is using the volteran gel to help when she is having pain flare ups also heat helps also.  Reviewed medications with patient and discussed the patient states the ultram has not helped with her pain at all and the brace that she got has not been beneficial either. 06-04-2020: The patient states the cortisone shots in her elbow and shoulder have been most beneficial for the elbow pain. Is going to schedule an appointment to be evaluated soon by orthopedic provider.  Collaborated with pcp  regarding unresolved pain in her left elbow. Saw pcp on 06-01-2012. Is also going to get appointment with the orthopedic provider.  Discussed plans with patient for ongoing care management follow up and provided patient with direct contact information for care management team  Reviewed scheduled/upcoming provider appointments including: Does not see the pcp again until 12-05-2020 at 0800 am.  Patient Self Care Activities:   Patient verbalizes understanding of plan to work with specialist, pcp and RNCM to help with pain in left elbow.   Attends all scheduled provider appointments  Calls provider office for new concerns or questions  Unable to independently mange chronic pain and discomfort.   Please see past updates related to this goal by clicking on the "Past Updates" button in the selected goal           Plan:   Telephone follow up appointment with care management team member scheduled  for: 08-06-2020 at 0900 am   [place], MSN, [place] Mobile: [phone]

## 2020-06-06 ENCOUNTER — Ambulatory Visit (INDEPENDENT_AMBULATORY_CARE_PROVIDER_SITE_OTHER): Payer: Medicare HMO

## 2020-06-06 ENCOUNTER — Other Ambulatory Visit: Payer: Self-pay

## 2020-06-06 DIAGNOSIS — I059 Rheumatic mitral valve disease, unspecified: Secondary | ICD-10-CM | POA: Diagnosis not present

## 2020-06-06 DIAGNOSIS — I4891 Unspecified atrial fibrillation: Secondary | ICD-10-CM

## 2020-06-06 DIAGNOSIS — Z5181 Encounter for therapeutic drug level monitoring: Secondary | ICD-10-CM

## 2020-06-06 DIAGNOSIS — I482 Chronic atrial fibrillation, unspecified: Secondary | ICD-10-CM | POA: Diagnosis not present

## 2020-06-06 DIAGNOSIS — Z9889 Other specified postprocedural states: Secondary | ICD-10-CM

## 2020-06-06 LAB — POCT INR: INR: 4 — AB (ref 2.0–3.0)

## 2020-06-06 NOTE — Patient Instructions (Signed)
-   skip warfarin tonight, then  - continue dosage of warfarin of 1/2 tablet every day EXCEPT 1 WHOLE TABLET ON [place].  - Recheck in 3 weeks  * HAVE YOUR [place] ON A SCHEDULE

## 2020-06-12 ENCOUNTER — Ambulatory Visit (INDEPENDENT_AMBULATORY_CARE_PROVIDER_SITE_OTHER): Payer: Medicare HMO

## 2020-06-12 VITALS — Ht 64.0 in | Wt 172.0 lb

## 2020-06-12 DIAGNOSIS — Z0000 Encounter for general adult medical examination without abnormal findings: Secondary | ICD-10-CM

## 2020-06-12 NOTE — Progress Notes (Signed)
I connected with [name] today by telephone and verified that I am speaking with the correct person using two identifiers. Location patient: home Location provider: work Persons participating in the virtual visit: Jailenne Nam, Glenna Durand LPN.   I discussed the limitations, risks, security and privacy concerns of performing an evaluation and management service by telephone and the availability of in person appointments. I also discussed with the patient that there may be a patient responsible charge related to this service. The patient expressed understanding and verbally consented to this telephonic visit.    Interactive audio and video telecommunications were attempted between this provider and patient, however failed, due to patient having technical difficulties OR patient did not have access to video capability.  We continued and completed visit with audio only.     Vital signs may be patient reported or missing.  Subjective:   [name] is a 64 y.o. female who presents for Medicare Annual (Subsequent) preventive examination.  Review of Systems     Cardiac Risk Factors include: advanced age (>70men, >75 women);hypertension;sedentary lifestyle     Objective:    Today's Vitals   06/12/20 0911 06/12/20 0912  Weight: 172 lb (78 kg)   Height: 5\' 4"  (1.626 m)   PainSc:  5    Body mass index is 29.52 kg/m.  Advanced Directives 06/12/2020 11/07/2019 06/07/2019 06/01/2018 08/17/2017 05/26/2017 01/23/2017  Does Patient Have a Medical Advance Directive? No No No No No Yes No  Does patient want to make changes to medical advance directive? - - - - - Yes (MAU/Ambulatory/Procedural Areas - Information given) -  Would patient like information on creating a medical advance directive? - Yes (MAU/Ambulatory/Procedural Areas - Information given) - No - Patient declined No - Patient declined - No - Patient declined  Pre-existing out of facility DNR order (yellow form or pink MOST  form) - - - - - - -    Current Medications (verified) Outpatient Encounter Medications as of 06/12/2020  Medication Sig  . acyclovir (ZOVIRAX) 400 MG tablet TAKE 1 TABLET BY MOUTH 3 TIMES DAILY FORFIVE TO TEN DAYS OR UNTIL HEALED. FOR FEVER BLISTER.  Marland Kitchen alendronate (FOSAMAX) 35 MG tablet TAKE 1 TABLET BY MOUTH EVERY 7 DAYS. TAKE WITH A FULL GLASS OF WATER ON AN EMPTY STOMACH.  Marland Kitchen amitriptyline (ELAVIL) 50 MG tablet Take 50 mg by mouth at bedtime as needed for sleep.   Marland Kitchen aspirin EC 81 MG tablet Take 1 tablet (81 mg total) by mouth daily.  Marland Kitchen atorvastatin (LIPITOR) 40 MG tablet TAKE 1 TABLET BY MOUTH ONCE EVERY EVENING  . calcium carbonate (OSCAL) 1500 (600 Ca) MG TABS tablet Take 1 tablet by mouth daily.   . cetirizine (ZYRTEC) 10 MG tablet TAKE 1 TABLET BY MOUTH ONCE A DAY  . Cholecalciferol (VITAMIN D3) 2000 units capsule Take 1 capsule (2,000 Units total) by mouth daily.  . cycloSPORINE (RESTASIS) 0.05 % ophthalmic emulsion Place 1 drop into both eyes 2 (two) times daily as needed (dry eyes).  Marland Kitchen dicyclomine (BENTYL) 10 MG capsule TAKE 1 CAPSULE BY MOUTH 4 TIMES A DAY BEFORE MEALS AND AT BEDTIME.  . fluticasone (FLONASE) 50 MCG/ACT nasal spray PLACE 2 SPRAYS INTO BOTH NOSTRILS DAILY  . furosemide (LASIX) 20 MG tablet TAKE 1 TABLET BY MOUTH EVERY OTHER DAY  . ibuprofen (ADVIL,MOTRIN) 200 MG tablet Take 200 mg by mouth 2 (two) times daily as needed for headache or moderate pain.  Marland Kitchen lisinopril (ZESTRIL) 40 MG tablet Take 1  tablet (40 mg total) by mouth daily.  . methocarbamol (ROBAXIN) 500 MG tablet Take 1 tablet (500 mg total) by mouth 4 (four) times daily as needed for muscle spasms (pain).  . metoprolol succinate (TOPROL-XL) 100 MG 24 hr tablet TAKE 1 TABLET BY MOUTH ONCE A DAY ** TAKE WITH OR IMMEDIATELY FOLLOWING A MEAL **  . Multiple Vitamins-Minerals (CENTRUM SILVER ADULT 50+ PO) Take 1 tablet by mouth daily.  Marland Kitchen warfarin (COUMADIN) 5 MG tablet TAKE 1 TABLET BY MOUTH ONCE A DAY OR AS DIRECTED  BY COUMADIN CLINIC.  Marland Kitchen enoxaparin (LOVENOX) 80 MG/0.8ML injection Inject 0.8 mLs (80 mg total) into the skin every 12 (twelve) hours. (Patient not taking: Reported on 06/12/2020)  . gabapentin (NEURONTIN) 100 MG capsule  (Patient not taking: Reported on 06/12/2020)   No facility-administered encounter medications on file as of 06/12/2020.    Allergies (verified) Patient has no known allergies.   History: Past Medical History:  Diagnosis Date  . Anticoagulated on warfarin   . Arthritis   . Chronic atrial fibrillation ([place])    SINCE 1997  . Dysrhythmia   . Goiter    multinodular  . Heart murmur   . History of hyperthyroidism    2003--  PTU [place]  . Hypercholesterolemia   . Hyperlipidemia   . Hyperthyroidism    H/O  . Pulmonary hypertension, mild ([place])   . Rheumatic heart disease    CARDIOLOGIST-- DR Marcello Moores [facility]  . S/P MVR (mitral valve replacement)    1997  [facility]--  SECONDARY TO SEVERE MV STENOSIS   Past Surgical History:  Procedure Laterality Date  . ANTERIOR CERVICAL DECOMP/DISCECTOMY FUSION  12-21-2001   C5 --  C6  . BREAST BIOPSY Left 11/03/2016   Procedure: BREAST BIOPSY WITH NEEDLE LOCALIZATION;  Surgeon: Robert Bellow, MD;  Location: [facility] ORS;  Service: General;  Laterality: Left;  . BREAST EXCISIONAL BIOPSY Left 2018  . CARDIAC CATHETERIZATION  12-28-2006  [facility]   NORMAL CORONARY ARTERIES  . CARPAL TUNNEL RELEASE Right 01-20-2007  . COLONOSCOPY  2015  . ERCP  08/15/2011   Procedure: ENDOSCOPIC RETROGRADE CHOLANGIOPANCREATOGRAPHY (ERCP);  Surgeon: Jeryl Columbia, MD;  Location[facility];  Service: Endoscopy;  Laterality: N/A;  . ERCP     MULTIPLE--  INCLUDING STENTS, SPHINTEROTOMY'S  STONE REMOVAL   . FEMUR IM NAIL Right 04/26/2014   Procedure: INTRAMEDULLARY (IM) NAIL FEMORAL;  Surgeon: Alta Corning, MD;  Location: [place];  Service: Orthopedics;  Laterality: Right;  . I & D EXTREMITY Right 02/07/2013   Procedure: IRRIGATION AND DEBRIDEMENT OF RIGHT  LOWER LEG WITH PLACEMENT OF ACELL AND WOULND VAC;  Surgeon: Theodoro Kos, DO;  Location: [place];  Service: Plastics;  Laterality: Right;  . LAPAROSCOPIC CHOLECYSTECTOMY  04-08-2002  . MITRAL VALVE REPLACEMENT  1997   [facility]  . ORIF ANKLE FRACTURE Right 11/05/2012   Procedure: OPEN REDUCTION INTERNAL FIXATION (ORIF) ANKLE FRACTURE only operating on right;  Surgeon: Alta Corning, MD;  Location: [place];  Service: Orthopedics;  Laterality: Right;  . ORIF RIGHT ULNAR FX W/ ILIAC CREST BONE GRAFT  10-06-2008  . ORIF WRIST FRACTURE Right 04/26/2014   Procedure: OPEN REDUCTION INTERNAL FIXATION (ORIF) WRIST FRACTURE;  Surgeon: Alta Corning, MD;  Location: [place];  Service: Orthopedics;  Laterality: Right;  . TRANSTHORACIC ECHOCARDIOGRAM  06-29-2009  dr wall   normal lvsf/ ef 55-60%/  normal bileaflet mechanical mvr/ moderated dilated la/ moderate tr  . WRIST  ARTHROSCOPY Right 12-10-2007   debridement and ulnar shortening osteotomy w/ plate   Family History  Problem Relation Age of Onset  . Hypothyroidism Sister   . Gallbladder disease Sister   . Coronary artery disease Other   . Diabetes Mother   . Hypertension Mother   . Heart disease Father   . Stroke Father   . Breast cancer Neg Hx    Social History   Socioeconomic History  . Marital status: Divorced    Spouse name: Not on file  . Number of children: 0  . Years of education: 73  . Highest education level: 12th grade  Occupational History  . Occupation: Control and instrumentation engineer    Comment: retired  Tobacco Use  . Smoking status: Never Smoker  . Smokeless tobacco: Never Used  Vaping Use  . Vaping Use: Never used  Substance and Sexual Activity  . Alcohol use: No  . Drug use: No  . Sexual activity: Not on file  Other Topics Concern  . Not on file  Social History Narrative  . Not on file   Social Determinants of Health   Financial Resource Strain: Low Risk   . Difficulty of Paying Living Expenses: Not hard at  all  Food Insecurity: No Food Insecurity  . Worried About Charity fundraiser in the Last Year: Never true  . Ran Out of Food in the Last Year: Never true  Transportation Needs: No Transportation Needs  . Lack of Transportation (Medical): No  . Lack of Transportation (Non-Medical): No  Physical Activity: Inactive  . Days of Exercise per Week: 0 days  . Minutes of Exercise per Session: 0 min  Stress: No Stress Concern Present  . Feeling of Stress : Not at all  Social Connections: Moderately Integrated  . Frequency of Communication with Friends and Family: More than three times a week  . Frequency of Social Gatherings with Friends and Family: More than three times a week  . Attends Religious Services: More than 4 times per year  . Active Member of Clubs or Organizations: Yes  . Attends Archivist Meetings: More than 4 times per year  . Marital Status: Divorced    Tobacco Counseling Counseling given: Not Answered   Clinical Intake:  Pre-visit preparation completed: Yes  Pain : 0-10 Pain Score: 5  Pain Type: Chronic pain Pain Location: Leg Pain Orientation: Right Pain Descriptors / Indicators: Aching Pain Onset: More than a month ago Pain Frequency: Intermittent     Nutritional Status: BMI 25 -29 Overweight Nutritional Risks: None Diabetes: No  How often do you need to have someone help you when you read instructions, pamphlets, or other written materials from your doctor or pharmacy?: 1 - Never What is the last grade level you completed in school?: 12th grade  Diabetic? No   Interpreter Needed?: No  Information entered by :: NAllen LPN   Activities of Daily Living In your present state of health, do you have any difficulty performing the following activities: 06/12/2020  Hearing? N  Vision? N  Difficulty concentrating or making decisions? N  Walking or climbing stairs? N  Dressing or bathing? N  Doing errands, shopping? N  Preparing Food and eating  ? N  Using the Toilet? N  In the past six months, have you accidently leaked urine? Y  Do you have problems with loss of bowel control? N  Managing your Medications? N  Housekeeping or managing your Housekeeping? N  Some recent data might be  hidden    Patient Care Team: Olin Hauser, DO as PCP - General (Family Medicine) Stanford Breed Denice Bors, MD as PCP - Cardiology (Cardiology) Renato Shin, MD as Attending Physician (Internal Medicine) Bary Castilla Forest Gleason, MD as Consulting Physician (General Surgery) Lucilla Lame, MD as Consulting Physician (Gastroenterology) Stanford Breed Denice Bors, MD as Consulting Physician (Cardiology) [name], RN as Case Manager (General Practice)  Indicate any recent Medical Services you may have received from other than [facility] providers in the past year (date may be approximate).     Assessment:   This is a routine wellness examination for Nikaya.  Hearing/Vision screen No exam data present  Dietary issues and exercise activities discussed: Current Exercise Habits: The patient does not participate in regular exercise at present  Goals    .  DIET - INCREASE WATER INTAKE      Recommend drinking at least 6-8 glasses of water a day     .  Patient Stated      06/12/2020, no goalas    .  Prevent falls      Recommend to remove any items from the home that may cause slips or trips.      Marland Kitchen  RNCM: Pt- "I could do better with the salt in my diet" (pt-stated)      CARE PLAN ENTRY (see longtitudinal plan of care for additional care plan information)  Current Barriers:  . Chronic Disease Management support, education, and care coordination needs related to Atrial Fibrillation, HTN, and HLD  Clinical Goal(s) related to Atrial Fibrillation, HTN, and HLD:  Over the next 120days, patient will:  . Work with the care management team to address educational, disease management, and care coordination needs  . Begin or continue self health monitoring  activities as directed today Measure and record blood pressure 5 times per week and adhere to a heart healthy diet . Call provider office for new or worsened signs and symptoms Blood pressure findings outside established parameters, Chest pain, and New or worsened symptom related to AFIB/HLD and other chronic conditions  . Call care management team with questions or concerns . Verbalize basic understanding of patient centered plan of care established today  Interventions related to Atrial Fibrillation, HTN, and HLD:  . Evaluation of current treatment plans and patient's adherence to plan as established by provider 06-04-2020: the patient feels she is doing well at taking care of her HTN, AFIB, and HLD. The patient says her blood pressures are good. She is having issues with her INR and having to have it checked more frequently. She states they want it to be between 3.0 and 3.5, however hers has been thinner.  Education and support given. Saw pcp on 06-01-2020 and received a good report. . Assessed patient understanding of disease states.  The patient has a good understanding of her chronic conditions and how to effectively manage.  06-04-2020: The patient has been experiencing some elbow pain but is working with the provider and has had shots in her elbow and shoulder that have helped. Is having a flare up of elbow pain. Heat helps. Will call the orthopedic provider to schedule an appointment for evaluation and treatment. . Assessed patient's education and care coordination needs.  The patient does not have an AD/LW- written information sent via mail to the patient today on AD/LW . Provided disease specific education to patient.  Discussed heart healthy diet- will send information via my Chart system and EMMI on DASH diet. 06-04-2020: The patients states  that since being intubated in 2010 she hates food. She eats but it is not her favorite thing to do. The patient states that she eats and stays hydrated but  does not have issues with maintaining her weight or over eating. Endorses following sodium restrictions.  Nash Dimmer with appropriate clinical care team members regarding patient needs.  Review of LCSW and pharmacist available for needs. Denies any needs at this time.  . Evaluation of next appointment with the pcp: 12-05-2020 at 0800  Patient Self Care Activities related to Atrial Fibrillation, HTN, and HLD:  . Patient is unable to independently self-manage chronic health conditions  Initial goal documentation and Please see past updates related to this goal by clicking on the "Past Updates" button in the selected goal      .  RNCM: Pt-"I am still having a lot of problems with my elbow" (pt-stated)      CARE PLAN ENTRY (see longitudinal plan of care for additional care plan information)  Current Barriers:  Marland Kitchen Knowledge Deficits related to pain in left elbow.  Per the patient the only thing that makes it feel better is "heat"  Nurse Case Manager Clinical Goal(s):  Marland Kitchen Over the next 120 days, patient will verbalize understanding of plan for finding the etiology of elbow pain and having a plan of care to help with relief of pain. . Over the next 120 days, patient will work with RNCM, pcp, and specialist  to address needs related to unrelieved elbow pain.  . Over the next 120 days, patient will attend all scheduled medical appointments: sees specialist for elbow pain on 01-24-2020. Marland Kitchen Over the next 120 days, patient will demonstrate improved adherence to prescribed treatment plan for pain relief of left elbow  as evidenced byreduced pain and discomfort.   Interventions:  . Inter-disciplinary care team collaboration (see longitudinal plan of care) . Evaluation of current treatment plan related to pain in left elbow and patient's adherence to plan as established by provider. 06-04-2020: The patient had shots in her elbow and in her shoulder and the shot in her shoulder helped her pain more than the  shot in her elbow. She states the doctor feels it is because of arthritis and where she had neck surgery. The patient also states that the weather change exacerbates it.  The patient states she is having a flare up.  Using heat to help. . Advised patient to call the provider for worsening level or intensity of pain.  06-04-2020: The patient is having a flare up and says she is going to call the orthopedic provider and get an appointment for evaluation. . Provided education to patient re: factors that may exacerbate pain and alternative pain relief measures. 06-04-2020: the patient is using the volteran gel to help when she is having pain flare ups also heat helps also. . Reviewed medications with patient and discussed the patient states the ultram has not helped with her pain at all and the brace that she got has not been beneficial either. 06-04-2020: The patient states the cortisone shots in her elbow and shoulder have been most beneficial for the elbow pain. Is going to schedule an appointment to be evaluated soon by orthopedic provider. Marland Kitchen Collaborated with pcp  regarding unresolved pain in her left elbow. Saw pcp on 06-01-2012. Is also going to get appointment with the orthopedic provider. . Discussed plans with patient for ongoing care management follow up and provided patient with direct contact information for care management team .  Reviewed scheduled/upcoming provider appointments including: Does not see the pcp again until 12-05-2020 at 0800 am.  Patient Self Care Activities:  . Patient verbalizes understanding of plan to work with specialist, pcp and RNCM to help with pain in left elbow.  . Attends all scheduled provider appointments . Calls provider office for new concerns or questions . Unable to independently mange chronic pain and discomfort.   Please see past updates related to this goal by clicking on the "Past Updates" button in the selected goal        Depression Screen PHQ 2/9  Scores 06/12/2020 06/01/2020 12/01/2019 11/07/2019 06/07/2019 12/13/2018 12/10/2018  PHQ - 2 Score 0 0 0 0 0 0 0  PHQ- 9 Score - - - - - - -    Fall Risk Fall Risk  06/12/2020 06/01/2020 12/01/2019 06/07/2019 12/13/2018  Falls in the past year? 0 0 0 0 0  Number falls in past yr: - 0 0 0 -  Injury with Fall? - 0 0 - -  Risk for fall due to : Medication side effect - - - -  Follow up Falls evaluation completed;Education provided;Falls prevention discussed Falls evaluation completed Falls evaluation completed Falls evaluation completed Falls evaluation completed    FALL RISK PREVENTION PERTAINING TO THE HOME:  Any stairs in or around the home? Yes  If so, are there any without handrails? No  Home free of loose throw rugs in walkways, pet beds, electrical cords, etc? Yes  Adequate lighting in your home to reduce risk of falls? Yes   ASSISTIVE DEVICES UTILIZED TO PREVENT FALLS:  Life alert? No  Use of a cane, walker or w/c? No  Grab bars in the bathroom? Yes  Shower chair or bench in shower? Yes  Elevated toilet seat or a handicapped toilet? Yes   TIMED UP AND GO:  Was the test performed? No .     Cognitive Function:     6CIT Screen 06/12/2020 06/01/2018 05/26/2017  What Year? 0 points 0 points 0 points  What month? 0 points 0 points 0 points  What time? 0 points 0 points 0 points  Count back from 20 0 points 0 points 0 points  Months in reverse 0 points 0 points 0 points  Repeat phrase 0 points 0 points 2 points  Total Score 0 0 2    Immunizations Immunization History  Administered Date(s) Administered  . Influenza, High Dose Seasonal PF 03/02/2019  . Influenza-Unspecified 03/17/2016, 03/30/2018, 03/16/2020  . PFIZER SARS-COV-2 Vaccination 08/24/2019, 09/14/2019, 03/20/2020  . Zoster 04/29/2016    TDAP status: Up to date  Flu Vaccine status: Up to date  Pneumococcal vaccine status: Due, Education has been provided regarding the importance of this vaccine. Advised may  receive this vaccine at local pharmacy or Health Dept. Aware to provide a copy of the vaccination record if obtained from local pharmacy or Health Dept. Verbalized acceptance and understanding.  Covid-19 vaccine status: Completed vaccines  Qualifies for Shingles Vaccine? Yes   Zostavax completed Yes   Shingrix Completed?: No.    Education has been provided regarding the importance of this vaccine. Patient has been advised to call insurance company to determine out of pocket expense if they have not yet received this vaccine. Advised may also receive vaccine at local pharmacy or Health Dept. Verbalized acceptance and understanding.  Screening Tests Health Maintenance  Topic Date Due  . Fecal DNA (Cologuard)  Never done  . PNA vac Low Risk Adult (1 of 2 -  PCV13) Never done  . DEXA SCAN  12/18/2019  . MAMMOGRAM  12/07/2021  . TETANUS/TDAP  04/26/2024  . INFLUENZA VACCINE  Completed  . COVID-19 Vaccine  Completed  . Hepatitis C Screening  Completed    Health Maintenance  Health Maintenance Due  Topic Date Due  . Fecal DNA (Cologuard)  Never done  . PNA vac Low Risk Adult (1 of 2 - PCV13) Never done  . DEXA SCAN  12/18/2019    Colorectal cancer screening: Type of screening: Colonoscopy. Completed 02/06/2016. Repeat every 10 years  Mammogram status: Completed 12/08/2019. Repeat every year  Bone Density status: Completed 12/17/2017. Results reflect: Bone density results: OSTEOPENIA. Repeat every 2 years.  Lung Cancer Screening: (Low Dose CT Chest recommended if Age 73-80 years, 30 pack-year currently smoking OR have quit w/in 15years.) does not qualify.   Lung Cancer Screening Referral: no  Additional Screening:  Hepatitis C Screening: does qualify; Completed 04/10/2016  Vision Screening: Recommended annual ophthalmology exams for early detection of glaucoma and other disorders of the eye. Is the patient up to date with their annual eye exam?  Yes  Who is the provider or what is  the name of the office in which the patient attends annual eye exams? [place] If pt is not established with a provider, would they like to be referred to a provider to establish care? No .   Dental Screening: Recommended annual dental exams for proper oral hygiene  Community Resource Referral / Chronic Care Management: CRR required this visit?  No   CCM required this visit?  No      Plan:     I have personally reviewed and noted the following in the patient's chart:   . Medical and social history . Use of alcohol, tobacco or illicit drugs  . Current medications and supplements . Functional ability and status . Nutritional status . Physical activity . Advanced directives . List of other physicians . Hospitalizations, surgeries, and ER visits in previous 12 months . Vitals . Screenings to include cognitive, depression, and falls . Referrals and appointments  In addition, I have reviewed and discussed with patient certain preventive protocols, quality metrics, and best practice recommendations. A written personalized care plan for preventive services as well as general preventive health recommendations were provided to patient.     Kellie Simmering, LPN   [id number]   Nurse Notes:

## 2020-06-12 NOTE — Patient Instructions (Signed)
[name] , Thank you for taking time to come for your Medicare Wellness Visit. I appreciate your ongoing commitment to your health goals. Please review the following plan we discussed and let me know if I can assist you in the future.   Screening recommendations/referrals: Colonoscopy: completed 02/06/2016 Mammogram: competed 12/08/2019 Bone Density: completed 12/17/2017, due Recommended yearly ophthalmology/optometry visit for glaucoma screening and checkup Recommended yearly dental visit for hygiene and checkup  Vaccinations: Influenza vaccine: completed 03/16/2020 Pneumococcal vaccine: due Tdap vaccine: completed 04/26/2014 Shingles vaccine: discussed   Covid-19: 03/20/2020, 09/14/2019, 08/24/2019  Advanced directives: Advance directive discussed with you today.   Conditions/risks identified: none  Next appointment: Follow up in one year for your annual wellness visit    Preventive Care 65 Years and Older, Female Preventive care refers to lifestyle choices and visits with your health care provider that can promote health and wellness. What does preventive care include?  A yearly physical exam. This is also called an annual well check.  Dental exams once or twice a year.  Routine eye exams. Ask your health care provider how often you should have your eyes checked.  Personal lifestyle choices, including:  Daily care of your teeth and gums.  Regular physical activity.  Eating a healthy diet.  Avoiding tobacco and drug use.  Limiting alcohol use.  Practicing safe sex.  Taking low-dose aspirin every day.  Taking vitamin and mineral supplements as recommended by your health care provider. What happens during an annual well check? The services and screenings done by your health care provider during your annual well check will depend on your age, overall health, lifestyle risk factors, and family history of disease. Counseling  Your health care provider may ask you  questions about your:  Alcohol use.  Tobacco use.  Drug use.  Emotional well-being.  Home and relationship well-being.  Sexual activity.  Eating habits.  History of falls.  Memory and ability to understand (cognition).  Work and work Statistician.  Reproductive health. Screening  You may have the following tests or measurements:  Height, weight, and BMI.  Blood pressure.  Lipid and cholesterol levels. These may be checked every 5 years, or more frequently if you are over 43 years old.  Skin check.  Lung cancer screening. You may have this screening every year starting at age 40 if you have a 30-pack-year history of smoking and currently smoke or have quit within the past 15 years.  Fecal occult blood test (FOBT) of the stool. You may have this test every year starting at age 24.  Flexible sigmoidoscopy or colonoscopy. You may have a sigmoidoscopy every 5 years or a colonoscopy every 10 years starting at age 16.  Hepatitis C blood test.  Hepatitis B blood test.  Sexually transmitted disease (STD) testing.  Diabetes screening. This is done by checking your blood sugar (glucose) after you have not eaten for a while (fasting). You may have this done every 1-3 years.  Bone density scan. This is done to screen for osteoporosis. You may have this done starting at age 39.  Mammogram. This may be done every 1-2 years. Talk to your health care provider about how often you should have regular mammograms. Talk with your health care provider about your test results, treatment options, and if necessary, the need for more tests. Vaccines  Your health care provider may recommend certain vaccines, such as:  Influenza vaccine. This is recommended every year.  Tetanus, diphtheria, and acellular pertussis (Tdap, Td) vaccine.  You may need a Td booster every 10 years.  Zoster vaccine. You may need this after age 24.  Pneumococcal 13-valent conjugate (PCV13) vaccine. One dose is  recommended after age 34.  Pneumococcal polysaccharide (PPSV23) vaccine. One dose is recommended after age 73. Talk to your health care provider about which screenings and vaccines you need and how often you need them. This information is not intended to replace advice given to you by your health care provider. Make sure you discuss any questions you have with your health care provider. Document Released: 07/06/2015 Document Revised: 02/27/2016 Document Reviewed: 04/10/2015 Elsevier Interactive Patient Education  2017 [place] Prevention in the Home Falls can cause injuries. They can happen to people of all ages. There are many things you can do to make your home safe and to help prevent falls. What can I do on the outside of my home?  Regularly fix the edges of walkways and driveways and fix any cracks.  Remove anything that might make you trip as you walk through a door, such as a raised step or threshold.  Trim any bushes or trees on the path to your home.  Use bright outdoor lighting.  Clear any walking paths of anything that might make someone trip, such as rocks or tools.  Regularly check to see if handrails are loose or broken. Make sure that both sides of any steps have handrails.  Any raised decks and porches should have guardrails on the edges.  Have any leaves, snow, or ice cleared regularly.  Use sand or salt on walking paths during winter.  Clean up any spills in your garage right away. This includes oil or grease spills. What can I do in the bathroom?  Use night lights.  Install grab bars by the toilet and in the tub and shower. Do not use towel bars as grab bars.  Use non-skid mats or decals in the tub or shower.  If you need to sit down in the shower, use a plastic, non-slip stool.  Keep the floor dry. Clean up any water that spills on the floor as soon as it happens.  Remove soap buildup in the tub or shower regularly.  Attach bath mats  securely with double-sided non-slip rug tape.  Do not have throw rugs and other things on the floor that can make you trip. What can I do in the bedroom?  Use night lights.  Make sure that you have a light by your bed that is easy to reach.  Do not use any sheets or blankets that are too big for your bed. They should not hang down onto the floor.  Have a firm chair that has side arms. You can use this for support while you get dressed.  Do not have throw rugs and other things on the floor that can make you trip. What can I do in the kitchen?  Clean up any spills right away.  Avoid walking on wet floors.  Keep items that you use a lot in easy-to-reach places.  If you need to reach something above you, use a strong step stool that has a grab bar.  Keep electrical cords out of the way.  Do not use floor polish or wax that makes floors slippery. If you must use wax, use non-skid floor wax.  Do not have throw rugs and other things on the floor that can make you trip. What can I do with my stairs?  Do not leave any  items on the stairs.  Make sure that there are handrails on both sides of the stairs and use them. Fix handrails that are broken or loose. Make sure that handrails are as long as the stairways.  Check any carpeting to make sure that it is firmly attached to the stairs. Fix any carpet that is loose or worn.  Avoid having throw rugs at the top or bottom of the stairs. If you do have throw rugs, attach them to the floor with carpet tape.  Make sure that you have a light switch at the top of the stairs and the bottom of the stairs. If you do not have them, ask someone to add them for you. What else can I do to help prevent falls?  Wear shoes that:  Do not have high heels.  Have rubber bottoms.  Are comfortable and fit you well.  Are closed at the toe. Do not wear sandals.  If you use a stepladder:  Make sure that it is fully opened. Do not climb a closed  stepladder.  Make sure that both sides of the stepladder are locked into place.  Ask someone to hold it for you, if possible.  Clearly mark and make sure that you can see:  Any grab bars or handrails.  First and last steps.  Where the edge of each step is.  Use tools that help you move around (mobility aids) if they are needed. These include:  Canes.  Walkers.  Scooters.  Crutches.  Turn on the lights when you go into a dark area. Replace any light bulbs as soon as they burn out.  Set up your furniture so you have a clear path. Avoid moving your furniture around.  If any of your floors are uneven, fix them.  If there are any pets around you, be aware of where they are.  Review your medicines with your doctor. Some medicines can make you feel dizzy. This can increase your chance of falling. Ask your doctor what other things that you can do to help prevent falls. This information is not intended to replace advice given to you by your health care provider. Make sure you discuss any questions you have with your health care provider. Document Released: 04/05/2009 Document Revised: 11/15/2015 Document Reviewed: 07/14/2014 Elsevier Interactive Patient Education  2017 [employer].

## 2020-06-27 ENCOUNTER — Ambulatory Visit (INDEPENDENT_AMBULATORY_CARE_PROVIDER_SITE_OTHER): Payer: Medicare HMO

## 2020-06-27 ENCOUNTER — Other Ambulatory Visit: Payer: Self-pay

## 2020-06-27 DIAGNOSIS — Z5181 Encounter for therapeutic drug level monitoring: Secondary | ICD-10-CM | POA: Diagnosis not present

## 2020-06-27 DIAGNOSIS — Z9889 Other specified postprocedural states: Secondary | ICD-10-CM | POA: Diagnosis not present

## 2020-06-27 DIAGNOSIS — I482 Chronic atrial fibrillation, unspecified: Secondary | ICD-10-CM | POA: Diagnosis not present

## 2020-06-27 DIAGNOSIS — I4811 Longstanding persistent atrial fibrillation: Secondary | ICD-10-CM

## 2020-06-27 DIAGNOSIS — I4891 Unspecified atrial fibrillation: Secondary | ICD-10-CM

## 2020-06-27 DIAGNOSIS — I059 Rheumatic mitral valve disease, unspecified: Secondary | ICD-10-CM | POA: Diagnosis not present

## 2020-06-27 LAB — POCT INR: INR: 2.3 (ref 2.0–3.0)

## 2020-06-27 NOTE — Patient Instructions (Signed)
-   take 1.5 tablets warfarin tonight, then  - continue dosage of warfarin of 1/2 tablet every day EXCEPT 1 WHOLE TABLET ON WEDNESDAYS & SATURDAYS.  - Recheck in 3 weeks  * HAVE YOUR [place] ON A SCHEDULE

## 2020-07-16 ENCOUNTER — Other Ambulatory Visit: Payer: Self-pay | Admitting: Family Medicine

## 2020-07-16 DIAGNOSIS — J3089 Other allergic rhinitis: Secondary | ICD-10-CM

## 2020-07-16 DIAGNOSIS — B001 Herpesviral vesicular dermatitis: Secondary | ICD-10-CM

## 2020-07-18 ENCOUNTER — Other Ambulatory Visit: Payer: Self-pay

## 2020-07-18 ENCOUNTER — Ambulatory Visit (INDEPENDENT_AMBULATORY_CARE_PROVIDER_SITE_OTHER): Payer: Medicare HMO

## 2020-07-18 DIAGNOSIS — Z5181 Encounter for therapeutic drug level monitoring: Secondary | ICD-10-CM | POA: Diagnosis not present

## 2020-07-18 DIAGNOSIS — I482 Chronic atrial fibrillation, unspecified: Secondary | ICD-10-CM

## 2020-07-18 DIAGNOSIS — Z9889 Other specified postprocedural states: Secondary | ICD-10-CM | POA: Diagnosis not present

## 2020-07-18 DIAGNOSIS — I4891 Unspecified atrial fibrillation: Secondary | ICD-10-CM | POA: Diagnosis not present

## 2020-07-18 DIAGNOSIS — I059 Rheumatic mitral valve disease, unspecified: Secondary | ICD-10-CM

## 2020-07-18 LAB — POCT INR: INR: 1.8 — AB (ref 2.0–3.0)

## 2020-07-18 NOTE — Patient Instructions (Signed)
-   take 1.5 tablets warfarin tonight, then  - START NEW dosage of warfarin of 1/2 tablet every day EXCEPT 1 WHOLE TABLET ON  MONDAYS, [place].  - Recheck in 3 weeks  * HAVE YOUR [place]ENS ON A SCHEDULE

## 2020-08-06 ENCOUNTER — Telehealth: Payer: Self-pay | Admitting: General Practice

## 2020-08-06 ENCOUNTER — Ambulatory Visit (INDEPENDENT_AMBULATORY_CARE_PROVIDER_SITE_OTHER): Payer: Medicare HMO | Admitting: General Practice

## 2020-08-06 DIAGNOSIS — I10 Essential (primary) hypertension: Secondary | ICD-10-CM | POA: Diagnosis not present

## 2020-08-06 DIAGNOSIS — I482 Chronic atrial fibrillation, unspecified: Secondary | ICD-10-CM

## 2020-08-06 DIAGNOSIS — E7800 Pure hypercholesterolemia, unspecified: Secondary | ICD-10-CM

## 2020-08-06 NOTE — Patient Instructions (Signed)
Visit Information  PATIENT GOALS: Patient Care Plan: RNCM: Atrial Fibrillation (Adult)    Problem Identified: RNCM: Dysrhythmia (Atrial Fibrillation)   Priority: Medium    Goal: RNCM: Heart Rate and Rhythm Monitored and Managed   Priority: Medium  Note:   Current Barriers:  . Chronic Disease Management support and education needs related to effective management of Afib  . Lacks caregiver support.  Leodis [place] social connections . Does not contact provider office for questions/concerns  Nurse Case Manager Clinical Goal(s):  Marland Kitchen Over the next 120 days, patient will verbalize understanding of plan for effective management of AFIB . Over the next 120 days, patient will work with [facility] and pcp  to address needs related to AFIB . Over the next 120 days, patient will attend all scheduled medical appointments: 12-05-2020 at 0800 am  Interventions:  . 1:1 collaboration with Olin Hauser, DO regarding development and update of comprehensive plan of care as evidenced by provider attestation and co-signature . Inter-disciplinary care team collaboration (see longitudinal plan of care) . Evaluation of current treatment plan related to Afib and patient's adherence to plan as established by provider. . Advised patient to call the office for changes or questions  . Provided education to patient re: changes in Afib, risk for heart attack and stroke  . Reviewed scheduled/upcoming provider appointments including: 12-05-2020 at 0800 am . Discussed plans with patient for ongoing care management follow up and provided patient with direct contact information for care management team  Patient Goals/Self-Care Activities Over the next 120 days, patient will:  - Patient will self administer medications as prescribed Patient will attend all scheduled provider appointments Patient will call pharmacy for medication refills Patient will attend church or other social activities Patient will continue to  perform ADL's independently Patient will continue to perform IADL's independently Patient will call provider office for new concerns or questions Patient will work with BSW to address care coordination needs and will continue to work with the clinical team to address health care and disease management related needs.   - activity or exercise based on tolerance encouraged - barriers to lifestyle changes reviewed and addressed - medication-adherence assessment completed - medication side effects monitored and managed - reassurance provided - rescue (action) plan developed - response to pharmacologic therapy monitored - screen for functional limitations reviewed - rescue (action) plan use encouraged  Follow Up Plan: Telephone follow up appointment with care management team member scheduled for:10-01-2020 at 0900 am       Task: RNCM: Alleviate Barriers to Dysrhythmia Management   Note:   Care Management Activities:    - activity or exercise based on tolerance encouraged - barriers to lifestyle changes reviewed and addressed - medication-adherence assessment completed - medication side effects monitored and managed - reassurance provided - rescue (action) plan developed - response to pharmacologic therapy monitored - screen for functional limitations reviewed - rescue (action) plan use encouraged       Patient Care Plan: RNCM: Hypertension (Adult)    Problem Identified: RNCM: Hypertension (Hypertension)   Priority: Medium    Goal: RNCM: Hypertension Monitored   Priority: Medium  Note:   Objective:  . Last practice recorded BP readings:  BP Readings from Last 3 Encounters:  06/01/20 136/80  02/20/20 114/86  12/01/19 134/77 .   Marland Kitchen Most recent eGFR/CrCl: No results found for: EGFR  No components found for: CRCL Current Barriers:  Marland Kitchen Knowledge Deficits related to basic understanding of hypertension pathophysiology and self care management .  Limited Social Support . Lacks  social connections . Does not contact provider office for questions/concerns Case Manager Clinical Goal(s):  Marland Kitchen Over the next 120 days, patient will verbalize understanding of plan for hypertension management . Over the next 120 days, patient will attend all scheduled medical appointments: 12-05-2020 . Over the next 120 days, patient will demonstrate improved adherence to prescribed treatment plan for hypertension as evidenced by taking all medications as prescribed, monitoring and recording blood pressure as directed, adhering to low sodium/DASH diet . Over the next 120 days, patient will demonstrate improved health management independence as evidenced by checking blood pressure as directed and notifying PCP if SBP>160 or DBP > 90, taking all medications as prescribe, and adhering to a low sodium diet as discussed. . Over the next 120 days, patient will verbalize basic understanding of hypertension disease process and self health management plan as evidenced by heart healthy diet, medications compliance, working with CCM team to optimize health and well being.  Interventions:  . Collaboration with Olin Hauser, DO regarding development and update of comprehensive plan of care as evidenced by provider attestation and co-signature . Inter-disciplinary care team collaboration (see longitudinal plan of care) . Evaluation of current treatment plan related to hypertension self management and patient's adherence to plan as established by provider. . Provided education to patient re: stroke prevention, s/s of heart attack and stroke, DASH diet, complications of uncontrolled blood pressure . Reviewed medications with patient and discussed importance of compliance . Discussed plans with patient for ongoing care management follow up and provided patient with direct contact information for care management team . Advised patient, providing education and rationale, to monitor blood pressure daily and  record, calling PCP for findings outside established parameters.  . Reviewed scheduled/upcoming provider appointments including:  12-05-2020 at 0800 am Patient Goals/Self-Care Activities . Over the next 120 days, patient will:  - Self administers medications as prescribed Attends all scheduled provider appointments Calls provider office for new concerns, questions, or BP outside discussed parameters Checks BP and records as discussed Follows a low sodium diet/DASH diet - blood pressure trends reviewed - depression screen reviewed - home or ambulatory blood pressure monitoring encouraged Follow Up Plan: Telephone follow up appointment with care management team member scheduled for: 10-01-2020 at 0900 am   Task: RNCM: Identify and Monitor Blood Pressure Elevation   Note:   Care Management Activities:    - blood pressure trends reviewed - depression screen reviewed - home or ambulatory blood pressure monitoring encouraged       Patient Care Plan: RNCM: HLD Management    Problem Identified: RNCM: HLD Management   Priority: Medium    Goal: RNCM: HLD Management   Note:   Current Barriers:  . Poorly controlled hyperlipidemia, complicated by Afib, not always following a heart healthy diet  . Current antihyperlipidemic regimen: Lipitor 40 mg QD . Most recent lipid panel:     Component Value Date/Time   CHOL 134 05/25/2020 0802   CHOL 157 10/01/2016 0919   TRIG 103 05/25/2020 0802   HDL 45 (L) 05/25/2020 0802   HDL 46 10/01/2016 0919   CHOLHDL 3.0 05/25/2020 0802   VLDL 20 06/05/2015 0926   LDLCALC 70 05/25/2020 0802   LDLDIRECT 192.5 11/04/2010 1131 .   Marland Kitchen ASCVD risk enhancing conditions: age >78, HTN, AFIB . Lacks social connections . Does not contact provider office for questions/concerns  RN Care Manager Clinical Goal(s):  Marland Kitchen Over the next 120 days, patient will  work with Consulting civil engineer, providers, and care team towards execution of optimized self-health management  plan . Over the next 120 days, patient will verbalize understanding of plan for effective management of HLD . Over the next 120 days, patient will work with [facility] and pcp to address needs related to changes or question about HLD . Over the next 120 days, patient will attend all scheduled medical appointments: 12-05-2020  Interventions: . Collaboration with Olin Hauser, DO regarding development and update of comprehensive plan of care as evidenced by provider attestation and co-signature . Inter-disciplinary care team collaboration (see longitudinal plan of care) . Medication review performed; medication list updated in electronic medical record.  Bertram Savin care team collaboration (see longitudinal plan of care) . Referred to pharmacy team for assistance with HLD medication management . Evaluation of current treatment plan related to HLD  and patient's adherence to plan as established by provider. . Advised patient to call the office for changes in condition or questions . Provided education to patient re: heart healthy diet, the patient does not like to eat, does stay hydrated with water  . Reviewed scheduled/upcoming provider appointments including: 12-05-2020 at 0800 am . Discussed plans with patient for ongoing care management follow up and provided patient with direct contact information for care management team   Patient Goals/Self-Care Activities: . Over the next 120 days, patient will:   - call for medicine refill 2 or 3 days before it runs out - call if I am sick and can't take my medicine - keep a list of all the medicines I take; vitamins and herbals too - learn to read medicine labels - use a pillbox to sort medicine - use an alarm clock or phone to remind me to take my medicine - change to whole grain breads, cereal, pasta - drink 6 to 8 glasses of water each day - eat 5 or 6 small meals each day - fill half the plate with nonstarchy vegetables - limit  fast food meals to no more than 1 per week - manage portion size - prepare main meal at home 3 to 5 days each week - read food labels for fat, fiber, carbohydrates and portion size - be open to making changes - I can manage, know and watch for signs of a heart attack - if I have chest pain, call for help - learn about small changes that will make a big difference - learn my personal risk factors  - administration or use of medication demonstrated - barriers to medication adherence identified - medication list compiled - medication list reviewed - medication-adherence assessment completed - medication side effects managed - understanding of current medications assessed Follow Up Plan: Telephone follow up appointment with care management team member scheduled for: 10-01-2020 at 0900 am     Task: RNCM: HLD management   Note:   Care Management Activities:    - administration or use of medication demonstrated - barriers to medication adherence identified - medication list compiled - medication list reviewed - medication-adherence assessment completed - medication side effects managed - understanding of current medications assessed        Patient verbalizes understanding of instructions provided today and agrees to view in [place].   Telephone follow up appointment with care management team member scheduled for: 10-01-2020 at 0900 am  Noreene Larsson RN, MSN, [place] Mobile: [phone]

## 2020-08-06 NOTE — Chronic Care Management (AMB) (Signed)
Chronic Care Management   CCM RN Visit Note  08/06/2020 Name: [name] MRN: [id number] DOB: 08/08/1955  Subjective: [name] is a 65 y.o. year old female who is a primary care patient of Olin Hauser, DO. The care management team was consulted for assistance with disease management and care coordination needs.    Engaged with patient by telephone for follow up visit in response to provider referral for case management and/or care coordination services.   Consent to Services:  The patient was given information about Chronic Care Management services, agreed to services, and gave verbal consent prior to initiation of services.  Please see initial visit note for detailed documentation.   Patient agreed to services and verbal consent obtained.   Assessment: Review of patient past medical history, allergies, medications, health status, including review of consultants reports, laboratory and other test data, was performed as part of comprehensive evaluation and provision of chronic care management services.   SDOH (Social Determinants of Health) assessments and interventions performed:    CCM Care Plan  No Known Allergies  Outpatient Encounter Medications as of 08/06/2020  Medication Sig   acyclovir (ZOVIRAX) 400 MG tablet TAKE 1 TABLET BY MOUTH 3 TIMES DAILY FORFIVE TO TEN DAYS OR UNTIL HEALED. FOR FEVER BLISTER.   alendronate (FOSAMAX) 35 MG tablet TAKE 1 TABLET BY MOUTH EVERY 7 DAYS. TAKE WITH A FULL GLASS OF WATER ON AN EMPTY STOMACH.   amitriptyline (ELAVIL) 50 MG tablet Take 50 mg by mouth at bedtime as needed for sleep.    aspirin EC 81 MG tablet Take 1 tablet (81 mg total) by mouth daily.   atorvastatin (LIPITOR) 40 MG tablet TAKE 1 TABLET BY MOUTH ONCE EVERY EVENING   calcium carbonate (OSCAL) 1500 (600 Ca) MG TABS tablet Take 1 tablet by mouth daily.    cetirizine (ZYRTEC) 10 MG tablet TAKE 1 TABLET BY MOUTH ONCE A DAY   Cholecalciferol (VITAMIN  D3) 2000 units capsule Take 1 capsule (2,000 Units total) by mouth daily.   cycloSPORINE (RESTASIS) 0.05 % ophthalmic emulsion Place 1 drop into both eyes 2 (two) times daily as needed (dry eyes).   dicyclomine (BENTYL) 10 MG capsule TAKE 1 CAPSULE BY MOUTH 4 TIMES A DAY BEFORE MEALS AND AT BEDTIME.   enoxaparin (LOVENOX) 80 MG/0.8ML injection Inject 0.8 mLs (80 mg total) into the skin every 12 (twelve) hours. (Patient not taking: Reported on 06/12/2020)   fluticasone (FLONASE) 50 MCG/ACT nasal spray PLACE 2 SPRAYS INTO BOTH NOSTRILS DAILY   furosemide (LASIX) 20 MG tablet TAKE 1 TABLET BY MOUTH EVERY OTHER DAY   gabapentin (NEURONTIN) 100 MG capsule  (Patient not taking: Reported on 06/12/2020)   ibuprofen (ADVIL,MOTRIN) 200 MG tablet Take 200 mg by mouth 2 (two) times daily as needed for headache or moderate pain.   lisinopril (ZESTRIL) 40 MG tablet Take 1 tablet (40 mg total) by mouth daily.   methocarbamol (ROBAXIN) 500 MG tablet Take 1 tablet (500 mg total) by mouth 4 (four) times daily as needed for muscle spasms (pain).   metoprolol succinate (TOPROL-XL) 100 MG 24 hr tablet TAKE 1 TABLET BY MOUTH ONCE A DAY ** TAKE WITH OR IMMEDIATELY FOLLOWING A MEAL **   Multiple Vitamins-Minerals (CENTRUM SILVER ADULT 50+ PO) Take 1 tablet by mouth daily.   warfarin (COUMADIN) 5 MG tablet TAKE 1 TABLET BY MOUTH ONCE A DAY OR AS DIRECTED BY COUMADIN CLINIC.   No facility-administered encounter medications on file as of 08/06/2020.  Patient Active Problem List   Diagnosis Date Noted   Obesity (BMI 30.0-34.9) 06/08/2018   Elevated hemoglobin A1c 06/18/2017   Allergic rhinitis 12/02/2016   Vitamin D deficiency 12/02/2016   S/P MVR (mitral valve replacement) 06/03/2016   Osteopenia 06/03/2016   Environmental and seasonal allergies 06/03/2016   History of epigastric pain 03/13/2016   Breast microcalcifications 10/15/2015   Piriformis syndrome 10/02/2015   History of femur  fracture 04/26/2014   Encounter for therapeutic drug monitoring 07/19/2013   Choledocholithiasis 08/14/2011   Anticoagulated on Coumadin 08/14/2011   Mitral valve disease 08/15/2010   MITRAL VALVE REPLACEMENT, HX OF 04/24/2009   History of hyperthyroidism 04/19/2009   Essential hypertension 10/17/2008   Pulmonary hypertension ([place]) 10/17/2008   RHEUMATIC HEART DISEASE, HX OF 10/17/2008   GOITER, MULTINODULAR 10/12/2007   HYPERCHOLESTEROLEMIA 10/12/2007   Chronic atrial fibrillation ([place]) 10/12/2007   GERD 10/12/2007    Conditions to be addressed/monitored:Atrial Fibrillation, HTN and HLD  Care Plan : RNCM: Atrial Fibrillation (Adult)  Updates made by [name] since 08/06/2020 12:00 AM    Problem: RNCM: Dysrhythmia (Atrial Fibrillation)   Priority: Medium    Goal: RNCM: Heart Rate and Rhythm Monitored and Managed   Priority: Medium  Note:   Current Barriers:   Chronic Disease Management support and education needs related to effective management of Afib   Lacks caregiver support.   Lacks social connections  Does not contact provider office for questions/concerns  Nurse Case Manager Clinical Goal(s):   Over the next 120 days, patient will verbalize understanding of plan for effective management of AFIB  Over the next 120 days, patient will work with [facility] and pcp  to address needs related to AFIB  Over the next 120 days, patient will attend all scheduled medical appointments: 12-05-2020 at 0800 am  Interventions:   1:1 collaboration with Olin Hauser, DO regarding development and update of comprehensive plan of care as evidenced by provider attestation and co-signature  Inter-disciplinary care team collaboration (see longitudinal plan of care)  Evaluation of current treatment plan related to Afib and patient's adherence to plan as established by provider.  Advised patient to call the office for changes or questions   Provided education  to patient re: changes in Afib, risk for heart attack and stroke   Reviewed scheduled/upcoming provider appointments including: 12-05-2020 at 0800 am  Discussed plans with patient for ongoing care management follow up and provided patient with direct contact information for care management team  Patient Goals/Self-Care Activities Over the next 120 days, patient will:  - Patient will self administer medications as prescribed Patient will attend all scheduled provider appointments Patient will call pharmacy for medication refills Patient will attend church or other social activities Patient will continue to perform ADL's independently Patient will continue to perform IADL's independently Patient will call provider office for new concerns or questions Patient will work with BSW to address care coordination needs and will continue to work with the clinical team to address health care and disease management related needs.   - activity or exercise based on tolerance encouraged - barriers to lifestyle changes reviewed and addressed - medication-adherence assessment completed - medication side effects monitored and managed - reassurance provided - rescue (action) plan developed - response to pharmacologic therapy monitored - screen for functional limitations reviewed - rescue (action) plan use encouraged  Follow Up Plan: Telephone follow up appointment with care management team member scheduled for:10-01-2020 at 0900 am  Task: RNCM: Alleviate Barriers to Dysrhythmia Management   Note:   Care Management Activities:    - activity or exercise based on tolerance encouraged - barriers to lifestyle changes reviewed and addressed - medication-adherence assessment completed - medication side effects monitored and managed - reassurance provided - rescue (action) plan developed - response to pharmacologic therapy monitored - screen for functional limitations reviewed - rescue (action)  plan use encouraged       Care Plan : RNCM: Hypertension (Adult)  Updates made by [name] since 08/06/2020 12:00 AM    Problem: RNCM: Hypertension (Hypertension)   Priority: Medium    Goal: RNCM: Hypertension Monitored   Priority: Medium  Note:   Objective:   Last practice recorded BP readings:  BP Readings from Last 3 Encounters:  06/01/20 136/80  02/20/20 114/86  12/01/19 134/77     Most recent eGFR/CrCl: No results found for: EGFR  No components found for: CRCL Current Barriers:   Knowledge Deficits related to basic understanding of hypertension pathophysiology and self care management  Limited Social Support  Lacks social connections  Does not contact provider office for questions/concerns Case Manager Clinical Goal(s):   Over the next 120 days, patient will verbalize understanding of plan for hypertension management  Over the next 120 days, patient will attend all scheduled medical appointments: 12-05-2020  Over the next 120 days, patient will demonstrate improved adherence to prescribed treatment plan for hypertension as evidenced by taking all medications as prescribed, monitoring and recording blood pressure as directed, adhering to low sodium/DASH diet  Over the next 120 days, patient will demonstrate improved health management independence as evidenced by checking blood pressure as directed and notifying PCP if SBP>160 or DBP > 90, taking all medications as prescribe, and adhering to a low sodium diet as discussed.  Over the next 120 days, patient will verbalize basic understanding of hypertension disease process and self health management plan as evidenced by heart healthy diet, medications compliance, working with CCM team to optimize health and well being.  Interventions:   Collaboration with Olin Hauser, DO regarding development and update of comprehensive plan of care as evidenced by provider attestation and  co-signature  Inter-disciplinary care team collaboration (see longitudinal plan of care)  Evaluation of current treatment plan related to hypertension self management and patient's adherence to plan as established by provider.  Provided education to patient re: stroke prevention, s/s of heart attack and stroke, DASH diet, complications of uncontrolled blood pressure  Reviewed medications with patient and discussed importance of compliance  Discussed plans with patient for ongoing care management follow up and provided patient with direct contact information for care management team  Advised patient, providing education and rationale, to monitor blood pressure daily and record, calling PCP for findings outside established parameters.   Reviewed scheduled/upcoming provider appointments including:  12-05-2020 at 0800 am Patient Goals/Self-Care Activities  Over the next 120 days, patient will:  - Self administers medications as prescribed Attends all scheduled provider appointments Calls provider office for new concerns, questions, or BP outside discussed parameters Checks BP and records as discussed Follows a low sodium diet/DASH diet - blood pressure trends reviewed - depression screen reviewed - home or ambulatory blood pressure monitoring encouraged Follow Up Plan: Telephone follow up appointment with care management team member scheduled for: 10-01-2020 at 0900 am   Task: RNCM: Identify and Monitor Blood Pressure Elevation   Note:   Care Management Activities:    - blood pressure trends reviewed -  depression screen reviewed - home or ambulatory blood pressure monitoring encouraged       Care Plan : RNCM: HLD Management  Updates made by [name] since 08/06/2020 12:00 AM    Problem: RNCM: HLD Management   Priority: Medium    Goal: RNCM: HLD Management   Note:   Current Barriers:   Poorly controlled hyperlipidemia, complicated by Afib, not always following a heart  healthy diet   Current antihyperlipidemic regimen: Lipitor 40 mg QD  Most recent lipid panel:     Component Value Date/Time   CHOL 134 05/25/2020 0802   CHOL 157 10/01/2016 0919   TRIG 103 05/25/2020 0802   HDL 45 (L) 05/25/2020 0802   HDL 46 10/01/2016 0919   CHOLHDL 3.0 05/25/2020 0802   VLDL 20 06/05/2015 0926   LDLCALC 70 05/25/2020 0802   LDLDIRECT 192.5 11/04/2010 1131     ASCVD risk enhancing conditions: age >35, HTN, AFIB  Lacks social connections  Does not contact provider office for questions/concerns  RN Care Manager Clinical Goal(s):   Over the next 120 days, patient will work with Consulting civil engineer, providers, and care team towards execution of optimized self-health management plan  Over the next 120 days, patient will verbalize understanding of plan for effective management of HLD  Over the next 120 days, patient will work with [facility] and pcp to address needs related to changes or question about HLD  Over the next 120 days, patient will attend all scheduled medical appointments: 12-05-2020  Interventions:  Collaboration with Olin Hauser, DO regarding development and update of comprehensive plan of care as evidenced by provider attestation and co-signature  Inter-disciplinary care team collaboration (see longitudinal plan of care)  Medication review performed; medication list updated in electronic medical record.   Inter-disciplinary care team collaboration (see longitudinal plan of care)  Referred to pharmacy team for assistance with HLD medication management  Evaluation of current treatment plan related to HLD  and patient's adherence to plan as established by provider.  Advised patient to call the office for changes in condition or questions  Provided education to patient re: heart healthy diet, the patient does not like to eat, does stay hydrated with water   Reviewed scheduled/upcoming provider appointments including: 12-05-2020 at 0800  am  Discussed plans with patient for ongoing care management follow up and provided patient with direct contact information for care management team   Patient Goals/Self-Care Activities:  Over the next 120 days, patient will:   - call for medicine refill 2 or 3 days before it runs out - call if I am sick and can't take my medicine - keep a list of all the medicines I take; vitamins and herbals too - learn to read medicine labels - use a pillbox to sort medicine - use an alarm clock or phone to remind me to take my medicine - change to whole grain breads, cereal, pasta - drink 6 to 8 glasses of water each day - eat 5 or 6 small meals each day - fill half the plate with nonstarchy vegetables - limit fast food meals to no more than 1 per week - manage portion size - prepare main meal at home 3 to 5 days each week - read food labels for fat, fiber, carbohydrates and portion size - be open to making changes - I can manage, know and watch for signs of a heart attack - if I have chest pain, call for help - learn about  small changes that will make a big difference - learn my personal risk factors  - administration or use of medication demonstrated - barriers to medication adherence identified - medication list compiled - medication list reviewed - medication-adherence assessment completed - medication side effects managed - understanding of current medications assessed Follow Up Plan: Telephone follow up appointment with care management team member scheduled for: 10-01-2020 at 0900 am     Task: RNCM: HLD management   Note:   Care Management Activities:    - administration or use of medication demonstrated - barriers to medication adherence identified - medication list compiled - medication list reviewed - medication-adherence assessment completed - medication side effects managed - understanding of current medications assessed       Plan:Telephone follow up appointment  with care management team member scheduled for:  10-01-2020 at 0900 am  [place], MSN, [place]: [phone]

## 2020-08-08 ENCOUNTER — Other Ambulatory Visit: Payer: Self-pay

## 2020-08-08 ENCOUNTER — Ambulatory Visit (INDEPENDENT_AMBULATORY_CARE_PROVIDER_SITE_OTHER): Payer: Medicare HMO

## 2020-08-08 DIAGNOSIS — I482 Chronic atrial fibrillation, unspecified: Secondary | ICD-10-CM | POA: Diagnosis not present

## 2020-08-08 DIAGNOSIS — I4891 Unspecified atrial fibrillation: Secondary | ICD-10-CM | POA: Diagnosis not present

## 2020-08-08 DIAGNOSIS — Z5181 Encounter for therapeutic drug level monitoring: Secondary | ICD-10-CM | POA: Diagnosis not present

## 2020-08-08 DIAGNOSIS — I059 Rheumatic mitral valve disease, unspecified: Secondary | ICD-10-CM

## 2020-08-08 DIAGNOSIS — Z9889 Other specified postprocedural states: Secondary | ICD-10-CM | POA: Diagnosis not present

## 2020-08-08 LAB — POCT INR: INR: 2.3 (ref 2.0–3.0)

## 2020-08-08 NOTE — Patient Instructions (Signed)
-   take 1.5 tablets warfarin tonight, then  - continue dosage of warfarin of 1/2 tablet every day EXCEPT 1 WHOLE TABLET ON  MONDAYS, [place].  - Recheck in 2 weeks  * HAVE YOUR [place] ON A SCHEDULE

## 2020-08-22 ENCOUNTER — Other Ambulatory Visit: Payer: Self-pay

## 2020-08-22 ENCOUNTER — Ambulatory Visit (INDEPENDENT_AMBULATORY_CARE_PROVIDER_SITE_OTHER): Payer: Medicare HMO

## 2020-08-22 DIAGNOSIS — I482 Chronic atrial fibrillation, unspecified: Secondary | ICD-10-CM

## 2020-08-22 DIAGNOSIS — I4811 Longstanding persistent atrial fibrillation: Secondary | ICD-10-CM

## 2020-08-22 DIAGNOSIS — I059 Rheumatic mitral valve disease, unspecified: Secondary | ICD-10-CM

## 2020-08-22 DIAGNOSIS — Z5181 Encounter for therapeutic drug level monitoring: Secondary | ICD-10-CM

## 2020-08-22 DIAGNOSIS — Z9889 Other specified postprocedural states: Secondary | ICD-10-CM | POA: Diagnosis not present

## 2020-08-22 DIAGNOSIS — I4891 Unspecified atrial fibrillation: Secondary | ICD-10-CM

## 2020-08-22 LAB — POCT INR: INR: 2.5 (ref 2.0–3.0)

## 2020-08-22 NOTE — Patient Instructions (Addendum)
-   take 1.5 tablets warfarin tonight, then  - START NEW dosage of warfarin of 1 tablet every day EXCEPT 1/2 WHOLE TABLET ON  [place]. - Recheck in 2 weeks  * HAVE YOUR [place] ON A SCHEDULE

## 2020-09-05 ENCOUNTER — Ambulatory Visit (INDEPENDENT_AMBULATORY_CARE_PROVIDER_SITE_OTHER): Payer: Medicare HMO

## 2020-09-05 ENCOUNTER — Other Ambulatory Visit: Payer: Self-pay

## 2020-09-05 DIAGNOSIS — Z5181 Encounter for therapeutic drug level monitoring: Secondary | ICD-10-CM

## 2020-09-05 DIAGNOSIS — I059 Rheumatic mitral valve disease, unspecified: Secondary | ICD-10-CM | POA: Diagnosis not present

## 2020-09-05 DIAGNOSIS — Z9889 Other specified postprocedural states: Secondary | ICD-10-CM

## 2020-09-05 DIAGNOSIS — I482 Chronic atrial fibrillation, unspecified: Secondary | ICD-10-CM

## 2020-09-05 DIAGNOSIS — I4891 Unspecified atrial fibrillation: Secondary | ICD-10-CM | POA: Diagnosis not present

## 2020-09-05 LAB — POCT INR: INR: 1.6 — AB (ref 2.0–3.0)

## 2020-09-05 NOTE — Patient Instructions (Signed)
-   take 2 tablets warfarin tonight, then  - START NEW dosage of warfarin of 1 tablet every day  - Recheck in 2 weeks  * HAVE YOUR [place] ON A SCHEDULE

## 2020-09-10 ENCOUNTER — Telehealth: Payer: Self-pay

## 2020-09-10 NOTE — Telephone Encounter (Signed)
Spoke w/ pt. Scheduled her for INR check 09/21/20 @ 8:00 @ [employer].  She is appreciative of the call.

## 2020-09-10 NOTE — Telephone Encounter (Signed)
Patient wanting to schedule with Coumadin nurse [facility] at [facility] on 4/1.  Please advise patient

## 2020-09-21 ENCOUNTER — Other Ambulatory Visit: Payer: Self-pay

## 2020-09-21 ENCOUNTER — Ambulatory Visit (INDEPENDENT_AMBULATORY_CARE_PROVIDER_SITE_OTHER): Payer: Medicare HMO

## 2020-09-21 DIAGNOSIS — I482 Chronic atrial fibrillation, unspecified: Secondary | ICD-10-CM | POA: Diagnosis not present

## 2020-09-21 DIAGNOSIS — Z5181 Encounter for therapeutic drug level monitoring: Secondary | ICD-10-CM

## 2020-09-21 DIAGNOSIS — Z9889 Other specified postprocedural states: Secondary | ICD-10-CM

## 2020-09-21 DIAGNOSIS — I059 Rheumatic mitral valve disease, unspecified: Secondary | ICD-10-CM | POA: Diagnosis not present

## 2020-09-21 DIAGNOSIS — R791 Abnormal coagulation profile: Secondary | ICD-10-CM

## 2020-09-21 DIAGNOSIS — I4891 Unspecified atrial fibrillation: Secondary | ICD-10-CM

## 2020-09-21 LAB — POCT INR
INR: 8 — AB (ref 2.0–3.0)
INR: 9.3 — AB (ref 2.0–3.0)

## 2020-09-21 LAB — PROTIME-INR
INR: 9.3 (ref 0.9–1.2)
Prothrombin Time: 91.4 s — ABNORMAL HIGH (ref 9.1–12.0)

## 2020-09-21 NOTE — Patient Instructions (Addendum)
-   hold warfarin for 4 days - on Tuesday, resume dosage of 1 tablet every day - Recheck as scheduled on Wednesday

## 2020-09-26 ENCOUNTER — Other Ambulatory Visit: Payer: Self-pay

## 2020-09-26 ENCOUNTER — Ambulatory Visit (INDEPENDENT_AMBULATORY_CARE_PROVIDER_SITE_OTHER): Payer: Medicare HMO

## 2020-09-26 DIAGNOSIS — Z9889 Other specified postprocedural states: Secondary | ICD-10-CM

## 2020-09-26 DIAGNOSIS — I059 Rheumatic mitral valve disease, unspecified: Secondary | ICD-10-CM

## 2020-09-26 DIAGNOSIS — I482 Chronic atrial fibrillation, unspecified: Secondary | ICD-10-CM

## 2020-09-26 DIAGNOSIS — I4891 Unspecified atrial fibrillation: Secondary | ICD-10-CM | POA: Diagnosis not present

## 2020-09-26 DIAGNOSIS — Z5181 Encounter for therapeutic drug level monitoring: Secondary | ICD-10-CM

## 2020-09-26 LAB — POCT INR: INR: 2.1 (ref 2.0–3.0)

## 2020-09-26 NOTE — Patient Instructions (Signed)
-   take 1.5 tablets warfarin tonight, then  - resume dosage of 1 tablet every day - Recheck INR in 1 week

## 2020-10-01 ENCOUNTER — Ambulatory Visit (INDEPENDENT_AMBULATORY_CARE_PROVIDER_SITE_OTHER): Payer: Medicare HMO | Admitting: General Practice

## 2020-10-01 ENCOUNTER — Telehealth: Payer: Self-pay | Admitting: General Practice

## 2020-10-01 DIAGNOSIS — I482 Chronic atrial fibrillation, unspecified: Secondary | ICD-10-CM | POA: Diagnosis not present

## 2020-10-01 DIAGNOSIS — I10 Essential (primary) hypertension: Secondary | ICD-10-CM

## 2020-10-01 DIAGNOSIS — E7800 Pure hypercholesterolemia, unspecified: Secondary | ICD-10-CM

## 2020-10-01 NOTE — Chronic Care Management (AMB) (Signed)
Chronic Care Management   CCM RN Visit Note  10/01/2020 Name: [name] MRN: [id number] DOB: 1955-12-13  Subjective: [name] is a 65 y.o. year old female who is a primary care patient of Olin Hauser, DO. The care management team was consulted for assistance with disease management and care coordination needs.    Engaged with patient by telephone for follow up visit in response to provider referral for case management and/or care coordination services.   Consent to Services:  The patient was given information about Chronic Care Management services, agreed to services, and gave verbal consent prior to initiation of services.  Please see initial visit note for detailed documentation.   Patient agreed to services and verbal consent obtained.   Assessment: Review of patient past medical history, allergies, medications, health status, including review of consultants reports, laboratory and other test data, was performed as part of comprehensive evaluation and provision of chronic care management services.   SDOH (Social Determinants of Health) assessments and interventions performed:    CCM Care Plan  No Known Allergies  Outpatient Encounter Medications as of 10/01/2020  Medication Sig  . acyclovir (ZOVIRAX) 400 MG tablet TAKE 1 TABLET BY MOUTH 3 TIMES DAILY FORFIVE TO TEN DAYS OR UNTIL HEALED. FOR FEVER BLISTER.  Marland Kitchen alendronate (FOSAMAX) 35 MG tablet TAKE 1 TABLET BY MOUTH EVERY 7 DAYS. TAKE WITH A FULL GLASS OF WATER ON AN EMPTY STOMACH.  Marland Kitchen amitriptyline (ELAVIL) 50 MG tablet Take 50 mg by mouth at bedtime as needed for sleep.   Marland Kitchen aspirin EC 81 MG tablet Take 1 tablet (81 mg total) by mouth daily.  Marland Kitchen atorvastatin (LIPITOR) 40 MG tablet TAKE 1 TABLET BY MOUTH ONCE EVERY EVENING  . calcium carbonate (OSCAL) 1500 (600 Ca) MG TABS tablet Take 1 tablet by mouth daily.   . cetirizine (ZYRTEC) 10 MG tablet TAKE 1 TABLET BY MOUTH ONCE A DAY  . Cholecalciferol (VITAMIN  D3) 2000 units capsule Take 1 capsule (2,000 Units total) by mouth daily.  . cycloSPORINE (RESTASIS) 0.05 % ophthalmic emulsion Place 1 drop into both eyes 2 (two) times daily as needed (dry eyes).  Marland Kitchen dicyclomine (BENTYL) 10 MG capsule TAKE 1 CAPSULE BY MOUTH 4 TIMES A DAY BEFORE MEALS AND AT BEDTIME.  Marland Kitchen enoxaparin (LOVENOX) 80 MG/0.8ML injection Inject 0.8 mLs (80 mg total) into the skin every 12 (twelve) hours. (Patient not taking: Reported on 06/12/2020)  . fluticasone (FLONASE) 50 MCG/ACT nasal spray PLACE 2 SPRAYS INTO BOTH NOSTRILS DAILY  . furosemide (LASIX) 20 MG tablet TAKE 1 TABLET BY MOUTH EVERY OTHER DAY  . gabapentin (NEURONTIN) 100 MG capsule  (Patient not taking: Reported on 06/12/2020)  . ibuprofen (ADVIL,MOTRIN) 200 MG tablet Take 200 mg by mouth 2 (two) times daily as needed for headache or moderate pain.  Marland Kitchen lisinopril (ZESTRIL) 40 MG tablet Take 1 tablet (40 mg total) by mouth daily.  . methocarbamol (ROBAXIN) 500 MG tablet Take 1 tablet (500 mg total) by mouth 4 (four) times daily as needed for muscle spasms (pain).  . metoprolol succinate (TOPROL-XL) 100 MG 24 hr tablet TAKE 1 TABLET BY MOUTH ONCE A DAY ** TAKE WITH OR IMMEDIATELY FOLLOWING A MEAL **  . Multiple Vitamins-Minerals (CENTRUM SILVER ADULT 50+ PO) Take 1 tablet by mouth daily.  Marland Kitchen warfarin (COUMADIN) 5 MG tablet TAKE 1 TABLET BY MOUTH ONCE A DAY OR AS DIRECTED BY COUMADIN CLINIC.   No facility-administered encounter medications on file as of 10/01/2020.  Patient Active Problem List   Diagnosis Date Noted  . Obesity (BMI 30.0-34.9) 06/08/2018  . Elevated hemoglobin A1c 06/18/2017  . Allergic rhinitis 12/02/2016  . Vitamin D deficiency 12/02/2016  . S/P MVR (mitral valve replacement) 06/03/2016  . Osteopenia 06/03/2016  . Environmental and seasonal allergies 06/03/2016  . History of epigastric pain 03/13/2016  . Breast microcalcifications 10/15/2015  . Piriformis syndrome 10/02/2015  . History of femur  fracture 04/26/2014  . Encounter for therapeutic drug monitoring 07/19/2013  . Choledocholithiasis 08/14/2011  . Anticoagulated on Coumadin 08/14/2011  . Mitral valve disease 08/15/2010  . MITRAL VALVE REPLACEMENT, HX OF 04/24/2009  . History of hyperthyroidism 04/19/2009  . Essential hypertension 10/17/2008  . Pulmonary hypertension ([place]) 10/17/2008  . RHEUMATIC HEART DISEASE, HX OF 10/17/2008  . GOITER, MULTINODULAR 10/12/2007  . HYPERCHOLESTEROLEMIA 10/12/2007  . Chronic atrial fibrillation ([place]) 10/12/2007  . GERD 10/12/2007    Conditions to be addressed/monitored:Atrial Fibrillation, HTN and HLD  Care Plan : RNCM: Atrial Fibrillation (Adult)  Updates made by [name] since 10/01/2020 12:00 AM    Problem: RNCM: Dysrhythmia (Atrial Fibrillation)   Priority: Medium    Goal: RNCM: Heart Rate and Rhythm Monitored and Managed   Priority: Medium  Note:   Current Barriers:  . Chronic Disease Management support and education needs related to effective management of Afib  . Lacks caregiver support.  Leodis Liverpool social connections . Does not contact provider office for questions/concerns  Nurse Case Manager Clinical Goal(s):  Marland Kitchen Over the next 120 days, patient will verbalize understanding of plan for effective management of AFIB . Over the next 120 days, patient will work with [facility] and pcp  to address needs related to AFIB . Over the next 120 days, patient will attend all scheduled medical appointments: 12-05-2020 at 0800 am  Interventions:  . 1:1 collaboration with Olin Hauser, DO regarding development and update of comprehensive plan of care as evidenced by provider attestation and co-signature . Inter-disciplinary care team collaboration (see longitudinal plan of care) . Evaluation of current treatment plan related to Afib and patient's adherence to plan as established by provider. 10-01-2020: The patient is doing well. She is having some issues keeping her INR  regulated and is having several blood test. She states she has always been on a roller coaster with her blood work. She goes back this Wednesday for a new check. Works with cardiologist for regulation of medications.  . Advised patient to call the office for changes or questions  . Provided education to patient re: changes in Afib, risk for heart attack and stroke  . Reviewed scheduled/upcoming provider appointments including: 12-05-2020 at 0800 am . Discussed plans with patient for ongoing care management follow up and provided patient with direct contact information for care management team  Patient Goals/Self-Care Activities Over the next 120 days, patient will:  - Patient will self administer medications as prescribed Patient will attend all scheduled provider appointments Patient will call pharmacy for medication refills Patient will attend church or other social activities Patient will continue to perform ADL's independently Patient will continue to perform IADL's independently Patient will call provider office for new concerns or questions Patient will work with BSW to address care coordination needs and will continue to work with the clinical team to address health care and disease management related needs.   - activity or exercise based on tolerance encouraged - barriers to lifestyle changes reviewed and addressed - medication-adherence assessment completed - medication side effects monitored  and managed - reassurance provided - rescue (action) plan developed - response to pharmacologic therapy monitored - screen for functional limitations reviewed - rescue (action) plan use encouraged  Follow Up Plan: Telephone follow up appointment with care management team member scheduled for:12-10-2020 at 0900 am       Care Plan : RNCM: Hypertension (Adult)  Updates made by [name] since 10/01/2020 12:00 AM    Problem: RNCM: Hypertension (Hypertension)   Priority: Medium     Long-Range Goal: RNCM: Hypertension Monitored   Priority: Medium  Note:   Objective:  . Last practice recorded BP readings:  . BP Readings from Last 3 Encounters: .  06/01/20 . 136/80 .  02/20/20 . 114/86 .  12/01/19 . 134/77 .    Marland Kitchen Most recent eGFR/CrCl: No results found for: EGFR  No components found for: CRCL Current Barriers:  Marland Kitchen Knowledge Deficits related to basic understanding of hypertension pathophysiology and self care management . Limited Social Support . Lacks social connections . Does not contact provider office for questions/concerns Case Manager Clinical Goal(s):  Marland Kitchen Over the next 120 days, patient will verbalize understanding of plan for hypertension management . Over the next 120 days, patient will attend all scheduled medical appointments: 12-05-2020 . Over the next 120 days, patient will demonstrate improved adherence to prescribed treatment plan for hypertension as evidenced by taking all medications as prescribed, monitoring and recording blood pressure as directed, adhering to low sodium/DASH diet . Over the next 120 days, patient will demonstrate improved health management independence as evidenced by checking blood pressure as directed and notifying PCP if SBP>160 or DBP > 90, taking all medications as prescribe, and adhering to a low sodium diet as discussed. . Over the next 120 days, patient will verbalize basic understanding of hypertension disease process and self health management plan as evidenced by heart healthy diet, medications compliance, working with CCM team to optimize health and well being.  Interventions:  . Collaboration with Olin Hauser, DO regarding development and update of comprehensive plan of care as evidenced by provider attestation and co-signature . Inter-disciplinary care team collaboration (see longitudinal plan of care) . Evaluation of current treatment plan related to hypertension self management and patient's adherence to plan  as established by provider. 10-01-2020: The patient currently has good control of her HTN. Monitors he food intake and actually does not like to eat but limits sodium and fried foods. She is happy about the temperature today being in the 80's so she can get out. Denies any new concerns.  . Provided education to patient re: stroke prevention, s/s of heart attack and stroke, DASH diet, complications of uncontrolled blood pressure . Reviewed medications with patient and discussed importance of compliance. 10-01-2020: The patient is compliant with her medications  . Discussed plans with patient for ongoing care management follow up and provided patient with direct contact information for care management team . Advised patient, providing education and rationale, to monitor blood pressure daily and record, calling PCP for findings outside established parameters.  . Reviewed scheduled/upcoming provider appointments including:  12-05-2020 at 0800 am Patient Goals/Self-Care Activities . Over the next 120 days, patient will:  - Self administers medications as prescribed Attends all scheduled provider appointments Calls provider office for new concerns, questions, or BP outside discussed parameters Checks BP and records as discussed Follows a low sodium diet/DASH diet- is compliant with heart healthy diet - blood pressure trends reviewed - depression screen reviewed - home or ambulatory blood  pressure monitoring encouraged Follow Up Plan: Telephone follow up appointment with care management team member scheduled for: 12-10-2020 at 0900 am   Care Plan : RNCM: HLD Management  Updates made by [name] since 10/01/2020 12:00 AM    Problem: RNCM: HLD Management   Priority: Medium    Long-Range Goal: RNCM: HLD Management   Priority: Medium  Note:   Current Barriers:  . Poorly controlled hyperlipidemia, complicated by Afib, not always following a heart healthy diet  . Current antihyperlipidemic regimen:  Lipitor 40 mg QD . Most recent lipid panel:     Component Value Date/Time   CHOL 134 05/25/2020 0802   CHOL 157 10/01/2016 0919   TRIG 103 05/25/2020 0802   HDL 45 (L) 05/25/2020 0802   HDL 46 10/01/2016 0919   CHOLHDL 3.0 05/25/2020 0802   VLDL 20 06/05/2015 0926   LDLCALC 70 05/25/2020 0802   LDLDIRECT 192.5 11/04/2010 1131 .   Marland Kitchen ASCVD risk enhancing conditions: age >81, HTN, AFIB . Lacks social connections . Does not contact provider office for questions/concerns  RN Care Manager Clinical Goal(s):  Marland Kitchen Over the next 120 days, patient will work with Consulting civil engineer, providers, and care team towards execution of optimized self-health management plan . Over the next 120 days, patient will verbalize understanding of plan for effective management of HLD . Over the next 120 days, patient will work with [facility] and pcp to address needs related to changes or question about HLD . Over the next 120 days, patient will attend all scheduled medical appointments: 12-05-2020  Interventions: . Collaboration with Olin Hauser, DO regarding development and update of comprehensive plan of care as evidenced by provider attestation and co-signature . Inter-disciplinary care team collaboration (see longitudinal plan of care) . Medication review performed; medication list updated in electronic medical record.  Bertram Savin care team collaboration (see longitudinal plan of care) . Referred to pharmacy team for assistance with HLD medication management . Evaluation of current treatment plan related to HLD  and patient's adherence to plan as established by provider. 10-01-2020: The patient is compliant with plan of care for HLD . Advised patient to call the office for changes in condition or questions . Provided education to patient re: heart healthy diet, the patient does not like to eat, does stay hydrated with water  . Reviewed scheduled/upcoming provider appointments including: 12-05-2020  at 0800 am . Discussed plans with patient for ongoing care management follow up and provided patient with direct contact information for care management team   Patient Goals/Self-Care Activities: . Over the next 120 days, patient will:   - call for medicine refill 2 or 3 days before it runs out - call if I am sick and can't take my medicine - keep a list of all the medicines I take; vitamins and herbals too - learn to read medicine labels - use a pillbox to sort medicine - use an alarm clock or phone to remind me to take my medicine - change to whole grain breads, cereal, pasta - drink 6 to 8 glasses of water each day - eat 5 or 6 small meals each day - fill half the plate with nonstarchy vegetables - limit fast food meals to no more than 1 per week - manage portion size - prepare main meal at home 3 to 5 days each week - read food labels for fat, fiber, carbohydrates and portion size - be open to making changes - I can manage,  know and watch for signs of a heart attack - if I have chest pain, call for help - learn about small changes that will make a big difference - learn my personal risk factors  - administration or use of medication demonstrated - barriers to medication adherence identified - medication list compiled - medication list reviewed - medication-adherence assessment completed - medication side effects managed - understanding of current medications assessed Follow Up Plan: Telephone follow up appointment with care management team member scheduled for: 12-10-2020 at 0900 am       Plan:Telephone follow up appointment with care management team member scheduled for:  12-10-2020 at 0900  [place], MSN, [place] Mobile: [phone]

## 2020-10-01 NOTE — Patient Instructions (Signed)
Visit Information  PATIENT GOALS: Goals Addressed            This Visit's Progress   . COMPLETED: DIET - INCREASE WATER INTAKE       Recommend drinking at least 6-8 glasses of water a day     . COMPLETED: Patient Stated       06/12/2020, no goalas    . COMPLETED: Prevent falls       Recommend to remove any items from the home that may cause slips or trips.      . COMPLETED: RNCM: Manage Chronic Pain       Timeframe:  Short-Term Goal  Goal met. Patient denies any further pain and discomfort.  Priority:  High Start Date:       08-06-2020                      Expected End Date:     10-01-2020                  Follow Up Date 10-01-2020   - call for medicine refill 2 or 3 days before it runs out - develop a personal pain management plan - keep track of prescription refills - prioritize tasks for the day - start a pain diary - track times pain is worst and when it is best - track what makes the pain worse and what makes it better - use ice or heat for pain relief - work slower and less intense when having pain    Why is this important?    Day-to-day life can be hard when you have chronic pain.   Pain medicine is just one piece of the treatment puzzle.   You can try these action steps to help you manage your pain.    Notes: Pain is controlled at this time. The patient states she does not have pain. Closing goal at this time and will continue to monitor for changes       Patient Care Plan: RNCM: Atrial Fibrillation (Adult)    Problem Identified: RNCM: Dysrhythmia (Atrial Fibrillation)   Priority: Medium    Goal: RNCM: Heart Rate and Rhythm Monitored and Managed   Priority: Medium  Note:   Current Barriers:  . Chronic Disease Management support and education needs related to effective management of Afib  . Lacks caregiver support.  Leodis [place] social connections . Does not contact provider office for questions/concerns  Nurse Case Manager Clinical Goal(s):  Marland Kitchen Over the  next 120 days, patient will verbalize understanding of plan for effective management of AFIB . Over the next 120 days, patient will work with [facility] and pcp  to address needs related to AFIB . Over the next 120 days, patient will attend all scheduled medical appointments: 12-05-2020 at 0800 am  Interventions:  . 1:1 collaboration with Olin Hauser, DO regarding development and update of comprehensive plan of care as evidenced by provider attestation and co-signature . Inter-disciplinary care team collaboration (see longitudinal plan of care) . Evaluation of current treatment plan related to Afib and patient's adherence to plan as established by provider. 10-01-2020: The patient is doing well. She is having some issues keeping her INR regulated and is having several blood test. She states she has always been on a roller coaster with her blood work. She goes back this Wednesday for a new check. Works with cardiologist for regulation of medications.  . Advised patient to call the office for changes or questions  .  Provided education to patient re: changes in Afib, risk for heart attack and stroke  . Reviewed scheduled/upcoming provider appointments including: 12-05-2020 at 0800 am . Discussed plans with patient for ongoing care management follow up and provided patient with direct contact information for care management team  Patient Goals/Self-Care Activities Over the next 120 days, patient will:  - Patient will self administer medications as prescribed Patient will attend all scheduled provider appointments Patient will call pharmacy for medication refills Patient will attend church or other social activities Patient will continue to perform ADL's independently Patient will continue to perform IADL's independently Patient will call provider office for new concerns or questions Patient will work with BSW to address care coordination needs and will continue to work with the clinical team to  address health care and disease management related needs.   - activity or exercise based on tolerance encouraged - barriers to lifestyle changes reviewed and addressed - medication-adherence assessment completed - medication side effects monitored and managed - reassurance provided - rescue (action) plan developed - response to pharmacologic therapy monitored - screen for functional limitations reviewed - rescue (action) plan use encouraged  Follow Up Plan: Telephone follow up appointment with care management team member scheduled for:12-10-2020 at 0900 am       Task: RNCM: Alleviate Barriers to Dysrhythmia Management   Note:   Care Management Activities:    - activity or exercise based on tolerance encouraged - barriers to lifestyle changes reviewed and addressed - medication-adherence assessment completed - medication side effects monitored and managed - reassurance provided - rescue (action) plan developed - response to pharmacologic therapy monitored - screen for functional limitations reviewed - rescue (action) plan use encouraged       Patient Care Plan: RNCM: Hypertension (Adult)    Problem Identified: RNCM: Hypertension (Hypertension)   Priority: Medium    Long-Range Goal: RNCM: Hypertension Monitored   Priority: Medium  Note:   Objective:  . Last practice recorded BP readings:  . BP Readings from Last 3 Encounters: .  06/01/20 . 136/80 .  02/20/20 . 114/86 .  12/01/19 . 134/77 .    Marland Kitchen Most recent eGFR/CrCl: No results found for: EGFR  No components found for: CRCL Current Barriers:  Marland Kitchen Knowledge Deficits related to basic understanding of hypertension pathophysiology and self care management . Limited Social Support . Lacks social connections . Does not contact provider office for questions/concerns Case Manager Clinical Goal(s):  Marland Kitchen Over the next 120 days, patient will verbalize understanding of plan for hypertension management . Over the next 120 days,  patient will attend all scheduled medical appointments: 12-05-2020 . Over the next 120 days, patient will demonstrate improved adherence to prescribed treatment plan for hypertension as evidenced by taking all medications as prescribed, monitoring and recording blood pressure as directed, adhering to low sodium/DASH diet . Over the next 120 days, patient will demonstrate improved health management independence as evidenced by checking blood pressure as directed and notifying PCP if SBP>160 or DBP > 90, taking all medications as prescribe, and adhering to a low sodium diet as discussed. . Over the next 120 days, patient will verbalize basic understanding of hypertension disease process and self health management plan as evidenced by heart healthy diet, medications compliance, working with CCM team to optimize health and well being.  Interventions:  . Collaboration with Olin Hauser, DO regarding development and update of comprehensive plan of care as evidenced by provider attestation and co-signature . Inter-disciplinary care team collaboration (  see longitudinal plan of care) . Evaluation of current treatment plan related to hypertension self management and patient's adherence to plan as established by provider. 10-01-2020: The patient currently has good control of her HTN. Monitors he food intake and actually does not like to eat but limits sodium and fried foods. She is happy about the temperature today being in the 80's so she can get out. Denies any new concerns.  . Provided education to patient re: stroke prevention, s/s of heart attack and stroke, DASH diet, complications of uncontrolled blood pressure . Reviewed medications with patient and discussed importance of compliance. 10-01-2020: The patient is compliant with her medications  . Discussed plans with patient for ongoing care management follow up and provided patient with direct contact information for care management team . Advised  patient, providing education and rationale, to monitor blood pressure daily and record, calling PCP for findings outside established parameters.  . Reviewed scheduled/upcoming provider appointments including:  12-05-2020 at 0800 am Patient Goals/Self-Care Activities . Over the next 120 days, patient will:  - Self administers medications as prescribed Attends all scheduled provider appointments Calls provider office for new concerns, questions, or BP outside discussed parameters Checks BP and records as discussed Follows a low sodium diet/DASH diet- is compliant with heart healthy diet - blood pressure trends reviewed - depression screen reviewed - home or ambulatory blood pressure monitoring encouraged Follow Up Plan: Telephone follow up appointment with care management team member scheduled for: 12-10-2020 at 0900 am   Task: RNCM: Identify and Monitor Blood Pressure Elevation   Note:   Care Management Activities:    - blood pressure trends reviewed - depression screen reviewed - home or ambulatory blood pressure monitoring encouraged       Patient Care Plan: RNCM: HLD Management    Problem Identified: RNCM: HLD Management   Priority: Medium    Long-Range Goal: RNCM: HLD Management   Priority: Medium  Note:   Current Barriers:  . Poorly controlled hyperlipidemia, complicated by Afib, not always following a heart healthy diet  . Current antihyperlipidemic regimen: Lipitor 40 mg QD . Most recent lipid panel:     Component Value Date/Time   CHOL 134 05/25/2020 0802   CHOL 157 10/01/2016 0919   TRIG 103 05/25/2020 0802   HDL 45 (L) 05/25/2020 0802   HDL 46 10/01/2016 0919   CHOLHDL 3.0 05/25/2020 0802   VLDL 20 06/05/2015 0926   LDLCALC 70 05/25/2020 0802   LDLDIRECT 192.5 11/04/2010 1131 .   Marland Kitchen ASCVD risk enhancing conditions: age >98, HTN, AFIB . Lacks social connections . Does not contact provider office for questions/concerns  RN Care Manager Clinical Goal(s):   Marland Kitchen Over the next 120 days, patient will work with Consulting civil engineer, providers, and care team towards execution of optimized self-health management plan . Over the next 120 days, patient will verbalize understanding of plan for effective management of HLD . Over the next 120 days, patient will work with [facility] and pcp to address needs related to changes or question about HLD . Over the next 120 days, patient will attend all scheduled medical appointments: 12-05-2020  Interventions: . Collaboration with Olin Hauser, DO regarding development and update of comprehensive plan of care as evidenced by provider attestation and co-signature . Inter-disciplinary care team collaboration (see longitudinal plan of care) . Medication review performed; medication list updated in electronic medical record.  Bertram Savin care team collaboration (see longitudinal plan of care) . Referred to pharmacy team  for assistance with HLD medication management . Evaluation of current treatment plan related to HLD  and patient's adherence to plan as established by provider. 10-01-2020: The patient is compliant with plan of care for HLD . Advised patient to call the office for changes in condition or questions . Provided education to patient re: heart healthy diet, the patient does not like to eat, does stay hydrated with water  . Reviewed scheduled/upcoming provider appointments including: 12-05-2020 at 0800 am . Discussed plans with patient for ongoing care management follow up and provided patient with direct contact information for care management team   Patient Goals/Self-Care Activities: . Over the next 120 days, patient will:   - call for medicine refill 2 or 3 days before it runs out - call if I am sick and can't take my medicine - keep a list of all the medicines I take; vitamins and herbals too - learn to read medicine labels - use a pillbox to sort medicine - use an alarm clock or phone to  remind me to take my medicine - change to whole grain breads, cereal, pasta - drink 6 to 8 glasses of water each day - eat 5 or 6 small meals each day - fill half the plate with nonstarchy vegetables - limit fast food meals to no more than 1 per week - manage portion size - prepare main meal at home 3 to 5 days each week - read food labels for fat, fiber, carbohydrates and portion size - be open to making changes - I can manage, know and watch for signs of a heart attack - if I have chest pain, call for help - learn about small changes that will make a big difference - learn my personal risk factors  - administration or use of medication demonstrated - barriers to medication adherence identified - medication list compiled - medication list reviewed - medication-adherence assessment completed - medication side effects managed - understanding of current medications assessed Follow Up Plan: Telephone follow up appointment with care management team member scheduled for: 12-10-2020 at 0900 am     Task: RNCM: HLD management   Note:   Care Management Activities:    - administration or use of medication demonstrated - barriers to medication adherence identified - medication list compiled - medication list reviewed - medication-adherence assessment completed - medication side effects managed - understanding of current medications assessed       Patient verbalizes understanding of instructions provided today and agrees to view in [place].   Telephone follow up appointment with care management team member scheduled for: 12-10-2020 at 0900  [place], MSN, [place] Mobile: [phone]

## 2020-10-03 ENCOUNTER — Other Ambulatory Visit: Payer: Self-pay

## 2020-10-03 ENCOUNTER — Ambulatory Visit (INDEPENDENT_AMBULATORY_CARE_PROVIDER_SITE_OTHER): Payer: Medicare HMO

## 2020-10-03 DIAGNOSIS — I4891 Unspecified atrial fibrillation: Secondary | ICD-10-CM | POA: Diagnosis not present

## 2020-10-03 DIAGNOSIS — Z9889 Other specified postprocedural states: Secondary | ICD-10-CM

## 2020-10-03 DIAGNOSIS — I482 Chronic atrial fibrillation, unspecified: Secondary | ICD-10-CM

## 2020-10-03 DIAGNOSIS — I059 Rheumatic mitral valve disease, unspecified: Secondary | ICD-10-CM | POA: Diagnosis not present

## 2020-10-03 DIAGNOSIS — Z5181 Encounter for therapeutic drug level monitoring: Secondary | ICD-10-CM | POA: Diagnosis not present

## 2020-10-03 DIAGNOSIS — Z7901 Long term (current) use of anticoagulants: Secondary | ICD-10-CM | POA: Diagnosis not present

## 2020-10-03 LAB — POCT INR: INR: 4.8 — AB (ref 2.0–3.0)

## 2020-10-03 NOTE — Patient Instructions (Signed)
-   skip warfarin tonight, then  - START NEW DOSAGE of warfarin of 1 tablet every day EXCEPT 1/2 tablet on [place]. - Recheck INR in 1 week

## 2020-10-10 ENCOUNTER — Other Ambulatory Visit: Payer: Self-pay

## 2020-10-10 ENCOUNTER — Ambulatory Visit (INDEPENDENT_AMBULATORY_CARE_PROVIDER_SITE_OTHER): Payer: Medicare HMO

## 2020-10-10 DIAGNOSIS — Z9889 Other specified postprocedural states: Secondary | ICD-10-CM

## 2020-10-10 DIAGNOSIS — I059 Rheumatic mitral valve disease, unspecified: Secondary | ICD-10-CM

## 2020-10-10 DIAGNOSIS — Z5181 Encounter for therapeutic drug level monitoring: Secondary | ICD-10-CM

## 2020-10-10 DIAGNOSIS — I4891 Unspecified atrial fibrillation: Secondary | ICD-10-CM

## 2020-10-10 DIAGNOSIS — I482 Chronic atrial fibrillation, unspecified: Secondary | ICD-10-CM | POA: Diagnosis not present

## 2020-10-10 LAB — POCT INR: INR: 3.7 — AB (ref 2.0–3.0)

## 2020-10-10 NOTE — Patient Instructions (Signed)
-   take 1/2 tablet warfarin tonight, then  - continue of warfarin of 1 tablet every day EXCEPT 1/2 tablet on [place]. - Recheck INR in 1 week

## 2020-10-17 ENCOUNTER — Ambulatory Visit (INDEPENDENT_AMBULATORY_CARE_PROVIDER_SITE_OTHER): Payer: Medicare HMO

## 2020-10-17 ENCOUNTER — Other Ambulatory Visit: Payer: Self-pay

## 2020-10-17 DIAGNOSIS — I059 Rheumatic mitral valve disease, unspecified: Secondary | ICD-10-CM | POA: Diagnosis not present

## 2020-10-17 DIAGNOSIS — I482 Chronic atrial fibrillation, unspecified: Secondary | ICD-10-CM | POA: Diagnosis not present

## 2020-10-17 DIAGNOSIS — I4811 Longstanding persistent atrial fibrillation: Secondary | ICD-10-CM | POA: Diagnosis not present

## 2020-10-17 DIAGNOSIS — Z9889 Other specified postprocedural states: Secondary | ICD-10-CM

## 2020-10-17 DIAGNOSIS — I4891 Unspecified atrial fibrillation: Secondary | ICD-10-CM | POA: Diagnosis not present

## 2020-10-17 DIAGNOSIS — Z5181 Encounter for therapeutic drug level monitoring: Secondary | ICD-10-CM | POA: Diagnosis not present

## 2020-10-17 LAB — POCT INR: INR: 4.3 — AB (ref 2.0–3.0)

## 2020-10-17 NOTE — Patient Instructions (Signed)
-   skip warfarin tonight, then  - continue of warfarin of 1 tablet every day EXCEPT 1/2 tablet on MONDAYS, [place]. - Recheck INR in 1 week

## 2020-10-24 ENCOUNTER — Ambulatory Visit (INDEPENDENT_AMBULATORY_CARE_PROVIDER_SITE_OTHER): Payer: Medicare HMO

## 2020-10-24 ENCOUNTER — Other Ambulatory Visit: Payer: Self-pay

## 2020-10-24 DIAGNOSIS — I4891 Unspecified atrial fibrillation: Secondary | ICD-10-CM

## 2020-10-24 DIAGNOSIS — Z9889 Other specified postprocedural states: Secondary | ICD-10-CM | POA: Diagnosis not present

## 2020-10-24 DIAGNOSIS — I482 Chronic atrial fibrillation, unspecified: Secondary | ICD-10-CM | POA: Diagnosis not present

## 2020-10-24 DIAGNOSIS — I059 Rheumatic mitral valve disease, unspecified: Secondary | ICD-10-CM

## 2020-10-24 DIAGNOSIS — Z5181 Encounter for therapeutic drug level monitoring: Secondary | ICD-10-CM | POA: Diagnosis not present

## 2020-10-24 LAB — POCT INR: INR: 2.2 (ref 2.0–3.0)

## 2020-10-24 NOTE — Patient Instructions (Signed)
-   take 1 whole tablet tonight, then - resume dosage of warfarin of 1 tablet every day EXCEPT 1/2 tablet on MONDAYS, [place]. - Recheck INR in 1 week

## 2020-10-25 ENCOUNTER — Other Ambulatory Visit: Payer: Self-pay | Admitting: Cardiology

## 2020-10-25 ENCOUNTER — Other Ambulatory Visit: Payer: Self-pay | Admitting: Family Medicine

## 2020-10-25 DIAGNOSIS — M8589 Other specified disorders of bone density and structure, multiple sites: Secondary | ICD-10-CM

## 2020-10-31 ENCOUNTER — Ambulatory Visit (INDEPENDENT_AMBULATORY_CARE_PROVIDER_SITE_OTHER): Payer: Medicare HMO

## 2020-10-31 ENCOUNTER — Other Ambulatory Visit: Payer: Self-pay

## 2020-10-31 DIAGNOSIS — I4891 Unspecified atrial fibrillation: Secondary | ICD-10-CM

## 2020-10-31 DIAGNOSIS — Z9889 Other specified postprocedural states: Secondary | ICD-10-CM | POA: Diagnosis not present

## 2020-10-31 DIAGNOSIS — I059 Rheumatic mitral valve disease, unspecified: Secondary | ICD-10-CM | POA: Diagnosis not present

## 2020-10-31 DIAGNOSIS — I482 Chronic atrial fibrillation, unspecified: Secondary | ICD-10-CM | POA: Diagnosis not present

## 2020-10-31 DIAGNOSIS — Z5181 Encounter for therapeutic drug level monitoring: Secondary | ICD-10-CM | POA: Diagnosis not present

## 2020-10-31 LAB — POCT INR: INR: 3.6 — AB (ref 2.0–3.0)

## 2020-10-31 NOTE — Patient Instructions (Signed)
-   have a serving of greens today,  - continue dosage of warfarin of 1 tablet every day EXCEPT 1/2 tablet on MONDAYS, [place]. - Recheck INR in 1 week

## 2020-11-07 ENCOUNTER — Ambulatory Visit (INDEPENDENT_AMBULATORY_CARE_PROVIDER_SITE_OTHER): Payer: Medicare HMO

## 2020-11-07 ENCOUNTER — Other Ambulatory Visit: Payer: Self-pay

## 2020-11-07 DIAGNOSIS — Z9889 Other specified postprocedural states: Secondary | ICD-10-CM

## 2020-11-07 DIAGNOSIS — I4891 Unspecified atrial fibrillation: Secondary | ICD-10-CM | POA: Diagnosis not present

## 2020-11-07 DIAGNOSIS — I482 Chronic atrial fibrillation, unspecified: Secondary | ICD-10-CM

## 2020-11-07 DIAGNOSIS — I059 Rheumatic mitral valve disease, unspecified: Secondary | ICD-10-CM

## 2020-11-07 DIAGNOSIS — Z5181 Encounter for therapeutic drug level monitoring: Secondary | ICD-10-CM

## 2020-11-07 LAB — POCT INR: INR: 3.1 — AB (ref 2.0–3.0)

## 2020-11-07 NOTE — Patient Instructions (Signed)
-   continue dosage of warfarin of 1 tablet every day EXCEPT 1/2 tablet on MONDAYS, [place]. - Recheck INR in 2 weeks

## 2020-11-21 ENCOUNTER — Other Ambulatory Visit: Payer: Self-pay

## 2020-11-21 ENCOUNTER — Ambulatory Visit (INDEPENDENT_AMBULATORY_CARE_PROVIDER_SITE_OTHER): Payer: Medicare HMO

## 2020-11-21 DIAGNOSIS — I4891 Unspecified atrial fibrillation: Secondary | ICD-10-CM

## 2020-11-21 DIAGNOSIS — I482 Chronic atrial fibrillation, unspecified: Secondary | ICD-10-CM

## 2020-11-21 DIAGNOSIS — Z9889 Other specified postprocedural states: Secondary | ICD-10-CM

## 2020-11-21 DIAGNOSIS — Z5181 Encounter for therapeutic drug level monitoring: Secondary | ICD-10-CM

## 2020-11-21 DIAGNOSIS — I059 Rheumatic mitral valve disease, unspecified: Secondary | ICD-10-CM | POA: Diagnosis not present

## 2020-11-21 LAB — POCT INR: INR: 4.7 — AB (ref 2.0–3.0)

## 2020-11-21 NOTE — Patient Instructions (Signed)
-   skip warfarin tonight, - take 1/2 tablet tomorrow, then - continue dosage of warfarin of 1 tablet every day EXCEPT 1/2 tablet on MONDAYS, [place]. - Recheck INR in 2 weeks

## 2020-11-22 ENCOUNTER — Ambulatory Visit (INDEPENDENT_AMBULATORY_CARE_PROVIDER_SITE_OTHER): Payer: Medicare HMO | Admitting: Nurse Practitioner

## 2020-11-22 ENCOUNTER — Encounter: Payer: Self-pay | Admitting: Nurse Practitioner

## 2020-11-22 VITALS — BP 140/90 | HR 89 | Ht 64.0 in | Wt 177.0 lb

## 2020-11-22 DIAGNOSIS — E782 Mixed hyperlipidemia: Secondary | ICD-10-CM

## 2020-11-22 DIAGNOSIS — I482 Chronic atrial fibrillation, unspecified: Secondary | ICD-10-CM

## 2020-11-22 DIAGNOSIS — R0789 Other chest pain: Secondary | ICD-10-CM

## 2020-11-22 DIAGNOSIS — I10 Essential (primary) hypertension: Secondary | ICD-10-CM | POA: Diagnosis not present

## 2020-11-22 DIAGNOSIS — Z952 Presence of prosthetic heart valve: Secondary | ICD-10-CM

## 2020-11-22 MED ORDER — LISINOPRIL 40 MG PO TABS: 40.0000 mg | ORAL_TABLET | Freq: Every day | ORAL | 2 refills | Status: DC

## 2020-11-22 MED ORDER — METOPROLOL SUCCINATE ER 100 MG PO TB24: 150.0000 mg | ORAL_TABLET | Freq: Every day | ORAL | 0 refills | Status: DC

## 2020-11-22 NOTE — Patient Instructions (Addendum)
Medication Instructions:  Your physician has recommended you make the following change in your medication:   1. INCREASE Metoprolol succinate 100 mg and take one and one half tablet (150 mg) once daily  *If you need a refill on your cardiac medications before your next appointment, please call your pharmacy*   Lab Work: BMET today  If you have labs (blood work) drawn today and your tests are completely normal, you will receive your results only by: Marland Kitchen MyChart Message (if you have MyChart) OR . A paper copy in the mail If you have any lab test that is abnormal or we need to change your treatment, we will call you to review the results.   Testing/Procedures: None   Follow-Up: At [facility], you and your health needs are our priority.  As part of our continuing mission to provide you with exceptional heart care, we have created designated Provider Care Teams.  These Care Teams include your primary Cardiologist (physician) and Advanced Practice Providers (APPs -  Physician Assistants and Nurse Practitioners) who all work together to provide you with the care you need, when you need it.   Your next appointment:   Keep follow up appointment with Coletta Memos NP  February 14, 2021 at 08:15 AM at the [facility] office.

## 2020-11-22 NOTE — Progress Notes (Signed)
Office Visit    Patient Name: [name] Date of Encounter: 11/22/2020  Primary Care Provider:  Olin Hauser, DO Primary Cardiologist:  Kirk Ruths, MD  Chief Complaint    65 year old female with a history of rheumatic heart disease and mitral stenosis status post St. Jude's mechanical mitral valve replacement in 1997, permanent atrial fibrillation, chronic Coumadin anticoagulation, hypertension, hyperlipidemia, and multinodular goiter/hyperthyroidism status post PTU, who presents for follow-up of sharp left sided c/p.  Past Medical History    Past Medical History:  Diagnosis Date  . Anticoagulated on warfarin   . Arthritis   . Heart murmur   . History of cardiac catheterization    a. 2008 Cath: nl cors.  . History of hyperthyroidism    a. 2003--  PTU [place]  . Hypercholesterolemia   . Hyperlipidemia   . Multinodular goiter   . Permanent atrial fibrillation ([place])    a. Dx 1997; b. CHA2DS2VASc = 3-->warfarin (s/p MVR as well); c. 06/2013 Holter: HRs freq in low 100's to 1-teens w/ max rate 179-->beta blocker increased.  . Pulmonary hypertension, mild ([place])   . Rheumatic heart disease   . S/P MVR (mitral valve replacement)    a. 1997 s/p SJM mechanical MVR 2/2 Sev MS; b. chronic warfarin; c. 10/2017 Echo: EF 55-60%, no rwma, triv AI. Nl fxn of MV prosthesis. Sev dil LA. Mildly to mod increased PA pressures.   Past Surgical History:  Procedure Laterality Date  . ANTERIOR CERVICAL DECOMP/DISCECTOMY FUSION  12-21-2001   C5 --  C6  . BREAST BIOPSY Left 11/03/2016   Procedure: BREAST BIOPSY WITH NEEDLE LOCALIZATION;  Surgeon: Robert Bellow, MD;  Location: [facility] ORS;  Service: General;  Laterality: Left;  . BREAST EXCISIONAL BIOPSY Left 2018  . CARDIAC CATHETERIZATION  12-28-2006  [facility]   NORMAL CORONARY ARTERIES  . CARPAL TUNNEL RELEASE Right 01-20-2007  . COLONOSCOPY  2015  . ERCP  08/15/2011   Procedure: ENDOSCOPIC RETROGRADE CHOLANGIOPANCREATOGRAPHY  (ERCP);  Surgeon: Jeryl Columbia, MD;  Location: [facility] ENDOSCOPY;  Service: Endoscopy;  Laterality: N/A;  . ERCP     MULTIPLE--  INCLUDING STENTS, SPHINTEROTOMY'S  STONE REMOVAL   . FEMUR IM NAIL Right 04/26/2014   Procedure: INTRAMEDULLARY (IM) NAIL FEMORAL;  Surgeon: Alta Corning, MD;  Location: [place];  Service: Orthopedics;  Laterality: Right;  . I & D EXTREMITY Right 02/07/2013   Procedure: IRRIGATION AND DEBRIDEMENT OF RIGHT LOWER LEG WITH PLACEMENT OF ACELL AND WOULND VAC;  Surgeon: Theodoro Kos, DO;  Location: [place];  Service: Plastics;  Laterality: Right;  . LAPAROSCOPIC CHOLECYSTECTOMY  04-08-2002  . MITRAL VALVE REPLACEMENT  1997   [facility] [place]  . ORIF ANKLE FRACTURE Right 11/05/2012   Procedure: OPEN REDUCTION INTERNAL FIXATION (ORIF) ANKLE FRACTURE only operating on right;  Surgeon: Alta Corning, MD;  Location: [place];  Service: Orthopedics;  Laterality: Right;  . ORIF RIGHT ULNAR FX W/ ILIAC CREST BONE GRAFT  10-06-2008  . ORIF WRIST FRACTURE Right 04/26/2014   Procedure: OPEN REDUCTION INTERNAL FIXATION (ORIF) WRIST FRACTURE;  Surgeon: Alta Corning, MD;  Location: [place];  Service: Orthopedics;  Laterality: Right;  . TRANSTHORACIC ECHOCARDIOGRAM  06-29-2009  dr wall   normal lvsf/ ef 55-60%/  normal bileaflet mechanical mvr/ moderated dilated la/ moderate tr  . WRIST ARTHROSCOPY Right 12-10-2007   debridement and ulnar shortening osteotomy w/ plate    Allergies  No Known Allergies  History of Present Illness  65 year old female with the above past medical history including rheumatic heart disease and mitral stenosis status post [employer] Jude mechanical mitral valve replacement 1997, permanent atrial fibrillation, chronic warfarin anticoagulation, hypertension, hyperlipidemia, and multinodular goiter/hyperthyroidism status post PTU.  She also underwent diagnostic catheterization in 2008 which revealed normal coronary arteries.  In early 2015, she underwent Holter  monitoring in the setting of permanent A. fib and elevated heart rates.  This documented rates up to 179 bpm beta-blocker dose was adjusted at that time.  She was last seen in clinic in August 2021, at which time she was doing well.  She notes that for the most part, she has continued to do well over the past year.  She exercises about 3 days a week, usually walking about a half a mile and does not experience chest pain or dyspnea.  She checks her blood pressure at home infrequently but notes that it trends between 130 and 170.  She is 140/90 today.  She is followed closely here in the [place] office anticoagulation clinic.  On June 1, she had to brief and somewhat fleeting episodes of sharp chest pain occurring over the lower left rib cage at approximately the left mid axillary line, lasting about 2 minutes, and resolving spontaneously.  She said pain was very sharp and worse with palpation.  She mentioned symptoms at anticoagulation clinic and was added on for today.  She has had no recurrence since the 2 brief episodes yesterday.  She denies palpitations, PND, orthopnea, dizziness, syncope, edema, or early satiety.  Home Medications    Prior to Admission medications   Medication Sig Start Date End Date Taking? Authorizing Provider  acyclovir (ZOVIRAX) 400 MG tablet TAKE 1 TABLET BY MOUTH 3 TIMES DAILY FORFIVE TO TEN DAYS OR UNTIL HEALED. FOR FEVER BLISTER. 07/17/20   Karamalegos, Devonne Doughty, DO  alendronate (FOSAMAX) 35 MG tablet TAKE 1 TABLET BY MOUTH EVERY 7 DAYS. TAKE WITH A FULL GLASS OF WATER ON AN EMPTY STOMACH. 10/25/20   Parks Ranger, Devonne Doughty, DO  amitriptyline (ELAVIL) 50 MG tablet Take 50 mg by mouth at bedtime as needed for sleep.  01/03/16   [provider]  aspirin EC 81 MG tablet Take 1 tablet (81 mg total) by mouth daily. 07/17/16   Lelon Perla, MD  atorvastatin (LIPITOR) 40 MG tablet TAKE 1 TABLET BY MOUTH ONCE EVERY EVENING 02/07/20   Lelon Perla, MD  calcium  carbonate (OSCAL) 1500 (600 Ca) MG TABS tablet Take 1 tablet by mouth daily.     [provider]  cetirizine (ZYRTEC) 10 MG tablet TAKE 1 TABLET BY MOUTH ONCE A DAY 02/06/20   Karamalegos, Devonne Doughty, DO  Cholecalciferol (VITAMIN D3) 2000 units capsule Take 1 capsule (2,000 Units total) by mouth daily. 06/18/17   Karamalegos, Devonne Doughty, DO  cycloSPORINE (RESTASIS) 0.05 % ophthalmic emulsion Place 1 drop into both eyes 2 (two) times daily as needed (dry eyes).    [provider]  dicyclomine (BENTYL) 10 MG capsule TAKE 1 CAPSULE BY MOUTH 4 TIMES A DAY BEFORE MEALS AND AT BEDTIME. 11/17/18   Lucilla Lame, MD  enoxaparin (LOVENOX) 80 MG/0.8ML injection Inject 0.8 mLs (80 mg total) into the skin every 12 (twelve) hours. Patient not taking: Reported on 06/12/2020 02/21/19   Minna Merritts, MD  fluticasone [facility]) 50 MCG/ACT nasal spray PLACE 2 SPRAYS INTO BOTH NOSTRILS DAILY 07/17/20   Parks Ranger, Devonne Doughty, DO  furosemide (LASIX) 20 MG tablet TAKE 1 TABLET BY MOUTH  EVERY OTHER DAY 05/03/20   Lelon Perla, MD  gabapentin (NEURONTIN) 100 MG capsule  05/05/19   [provider]  ibuprofen (ADVIL,MOTRIN) 200 MG tablet Take 200 mg by mouth 2 (two) times daily as needed for headache or moderate pain.    [provider]  lisinopril (ZESTRIL) 40 MG tablet Take 1 tablet (40 mg total) by mouth daily. 06/01/20   Karamalegos, Devonne Doughty, DO  methocarbamol (ROBAXIN) 500 MG tablet Take 1 tablet (500 mg total) by mouth 4 (four) times daily as needed for muscle spasms (pain). 06/01/20   Karamalegos, Devonne Doughty, DO  metoprolol succinate (TOPROL-XL) 100 MG 24 hr tablet TAKE 1 TABLET BY MOUTH ONCE A DAY ** TAKE WITH OR IMMEDIATELY FOLLOWING A MEAL ** 02/07/20   Lelon Perla, MD  Multiple Vitamins-Minerals (CENTRUM SILVER ADULT 50+ PO) Take 1 tablet by mouth daily.    [provider]  warfarin (COUMADIN) 5 MG tablet TAKE 1 TABLET BY MOUTH ONCE A DAY OR AS DIRECTED  BY COUMADIN CLINIC. 10/25/20   Lelon Perla, MD    Review of Systems    2 brief episodes of sharp and somewhat fleeting chest pain yesterday.  She denies palpitations, PND, dyspnea, orthopnea, dizziness, syncope, edema, or early satiety.  All other systems reviewed and are otherwise negative except as noted above.  Physical Exam    VS:  BP 140/90 (BP Location: Left Arm, Patient Position: Sitting, Cuff Size: Normal)   Pulse 89   Ht 5\' 4"  (1.626 m)   Wt 177 lb (80.3 kg)   SpO2 98%   BMI 30.38 kg/m  , BMI Body mass index is 30.38 kg/m. GEN: Well nourished, well developed, in no acute distress. HEENT: normal. Neck: Supple, no JVD, carotid bruits, or masses. Cardiac: Irregularly irregular, mechanical S1, no murmurs, rubs, or gallops. No clubbing, cyanosis, edema.  Radials/DP/PT 2+ and equal bilaterally.  Unable to reproduce chest pain with palpation today. Respiratory:  Respirations regular and unlabored, clear to auscultation bilaterally. GI: Soft, nontender, nondistended, BS + x 4. MS: no deformity or atrophy. Skin: warm and dry, no rash. Neuro:  Strength and sensation are intact. Psych: Normal affect.  Accessory Clinical Findings    ECG personally reviewed by me today -atrial fibrillation, 89, nonspecific ST changes- no acute changes.  Lab Results  Component Value Date   WBC 4.6 05/25/2020   HGB 11.7 05/25/2020   HCT 36.0 05/25/2020   MCV 87.8 05/25/2020   PLT 158 05/25/2020   Lab Results  Component Value Date   CREATININE 0.82 05/25/2020   BUN 10 05/25/2020   NA 141 05/25/2020   K 3.4 (L) 05/25/2020   CL 103 05/25/2020   CO2 30 05/25/2020   Lab Results  Component Value Date   ALT 12 05/25/2020   AST 19 05/25/2020   ALKPHOS 106 01/23/2017   BILITOT 1.0 05/25/2020   Lab Results  Component Value Date   CHOL 134 05/25/2020   HDL 45 (L) 05/25/2020   LDLCALC 70 05/25/2020   LDLDIRECT 192.5 11/04/2010   TRIG 103 05/25/2020   CHOLHDL 3.0 05/25/2020    Lab  Results  Component Value Date   HGBA1C 5.4 05/25/2020    Assessment & Plan    1.  Left-sided chest pain: Yesterday, patient had 2 brief and somewhat fleeting episodes of sharp left-sided chest pain and tenderness along the lower left rib cage at the left mid axillary line.  There were no associated symptoms.  Each episode  lasted no more than 2 minutes.  She said it felt like a muscle spasm.  She has not had any recurrence.  She notes reasonable exercise tolerance, walking 3 days a week without symptoms or limitations.  ECG is unchanged from last August.  No tenderness on exam today.  Suspect symptoms are primarily musculoskeletal and reassurance offered.  Given spasmodic nature of symptoms, as she is on ACE inhibitor and diuretic, I will follow-up a basic metabolic panel to evaluate renal function and electrolytes.  2.  History of mitral stenosis status post mechanical mitral valve replacement in 1997: Stable functioning mechanical mitral valve by echocardiogram in 2019.  She has not been experiencing any dyspnea, edema, or anginal symptoms.  She remains on warfarin and is followed closely in our anticoagulation clinic with supratherapeutic INR yesterday at 4.7.  She remains on aspirin and will require SBE prophylaxis when appropriate.  3.  Essential hypertension: Blood pressure today is 140/90.  She says she typically runs 725D to 664Q systolic at home.  I am going to increase her Toprol-XL to 150 mg every night.  She otherwise remains on lisinopril 40 mg daily and Lasix 20 mg every other day.  I encouraged her to follow her blood pressures closely at home and report to our office if she continues to trend greater than 130, at which time we may need to consider an additional agent.  4.  Hyperlipidemia: On statin therapy with an LDL of 70 in December.  LFTs were normal at that time.  5.  Permanent atrial fibrillation: Asymptomatic and anticoagulated.  Rate is 89 today.  In the setting of concomitant  hypertension, I am increasing Toprol-XL to 150 mg nightly.  6.  Disposition: Follow-up basic metabolic panel today.  Patient will keep follow-up appointment in our [facility] office in August.  Murray Hodgkins, NP 11/22/2020, 8:06 AM

## 2020-11-23 LAB — BASIC METABOLIC PANEL
BUN/Creatinine Ratio: 9 — ABNORMAL LOW (ref 12–28)
BUN: 9 mg/dL (ref 8–27)
CO2: 24 mmol/L (ref 20–29)
Calcium: 9.1 mg/dL (ref 8.7–10.3)
Chloride: 103 mmol/L (ref 96–106)
Creatinine, Ser: 0.99 mg/dL (ref 0.57–1.00)
Glucose: 104 mg/dL — ABNORMAL HIGH (ref 65–99)
Potassium: 4.7 mmol/L (ref 3.5–5.2)
Sodium: 140 mmol/L (ref 134–144)
eGFR: 62 mL/min/{1.73_m2} (ref >59)

## 2020-11-28 ENCOUNTER — Other Ambulatory Visit: Payer: Medicare HMO

## 2020-11-28 DIAGNOSIS — R7989 Other specified abnormal findings of blood chemistry: Secondary | ICD-10-CM | POA: Diagnosis not present

## 2020-11-28 DIAGNOSIS — E876 Hypokalemia: Secondary | ICD-10-CM | POA: Diagnosis not present

## 2020-11-28 DIAGNOSIS — E785 Hyperlipidemia, unspecified: Secondary | ICD-10-CM | POA: Diagnosis not present

## 2020-11-29 LAB — BASIC METABOLIC PANEL WITH GFR
BUN: 11 mg/dL (ref 7–25)
CO2: 30 mmol/L (ref 20–32)
Calcium: 9.1 mg/dL (ref 8.6–10.4)
Chloride: 104 mmol/L (ref 98–110)
Creat: 0.95 mg/dL (ref 0.50–0.99)
GFR, Est African American: 72 mL/min/{1.73_m2} (ref >=60)
GFR, Est Non African American: 62 mL/min/{1.73_m2} (ref >=60)
Glucose, Bld: 95 mg/dL (ref 65–99)
Potassium: 3.8 mmol/L (ref 3.5–5.3)
Sodium: 139 mmol/L (ref 135–146)

## 2020-11-29 LAB — TSH: TSH: 0.4 mIU/L (ref 0.40–4.50)

## 2020-11-29 LAB — T4, FREE: Free T4: 1.3 ng/dL (ref 0.8–1.8)

## 2020-12-05 ENCOUNTER — Ambulatory Visit (INDEPENDENT_AMBULATORY_CARE_PROVIDER_SITE_OTHER): Payer: Medicare HMO

## 2020-12-05 ENCOUNTER — Encounter: Payer: Self-pay | Admitting: Family Medicine

## 2020-12-05 ENCOUNTER — Other Ambulatory Visit: Payer: Self-pay | Admitting: Family Medicine

## 2020-12-05 ENCOUNTER — Other Ambulatory Visit: Payer: Self-pay

## 2020-12-05 ENCOUNTER — Ambulatory Visit (INDEPENDENT_AMBULATORY_CARE_PROVIDER_SITE_OTHER): Payer: Medicare HMO | Admitting: Family Medicine

## 2020-12-05 VITALS — BP 140/80 | HR 73 | Ht 64.0 in | Wt 177.6 lb

## 2020-12-05 DIAGNOSIS — I059 Rheumatic mitral valve disease, unspecified: Secondary | ICD-10-CM

## 2020-12-05 DIAGNOSIS — R7309 Other abnormal glucose: Secondary | ICD-10-CM

## 2020-12-05 DIAGNOSIS — E7800 Pure hypercholesterolemia, unspecified: Secondary | ICD-10-CM

## 2020-12-05 DIAGNOSIS — I10 Essential (primary) hypertension: Secondary | ICD-10-CM

## 2020-12-05 DIAGNOSIS — Z9889 Other specified postprocedural states: Secondary | ICD-10-CM

## 2020-12-05 DIAGNOSIS — E042 Nontoxic multinodular goiter: Secondary | ICD-10-CM | POA: Diagnosis not present

## 2020-12-05 DIAGNOSIS — I272 Pulmonary hypertension, unspecified: Secondary | ICD-10-CM

## 2020-12-05 DIAGNOSIS — I482 Chronic atrial fibrillation, unspecified: Secondary | ICD-10-CM

## 2020-12-05 DIAGNOSIS — Z1231 Encounter for screening mammogram for malignant neoplasm of breast: Secondary | ICD-10-CM | POA: Diagnosis not present

## 2020-12-05 DIAGNOSIS — Z5181 Encounter for therapeutic drug level monitoring: Secondary | ICD-10-CM

## 2020-12-05 DIAGNOSIS — Z1211 Encounter for screening for malignant neoplasm of colon: Secondary | ICD-10-CM

## 2020-12-05 DIAGNOSIS — I4891 Unspecified atrial fibrillation: Secondary | ICD-10-CM | POA: Diagnosis not present

## 2020-12-05 DIAGNOSIS — T63441A Toxic effect of venom of bees, accidental (unintentional), initial encounter: Secondary | ICD-10-CM

## 2020-12-05 DIAGNOSIS — Z0000 Encounter for general adult medical examination without abnormal findings: Secondary | ICD-10-CM

## 2020-12-05 LAB — POCT INR: INR: 5.2 — AB (ref 2.0–3.0)

## 2020-12-05 MED ORDER — TRIAMCINOLONE ACETONIDE 0.5 % EX CREA: 1.0000 "application " | TOPICAL_CREAM | Freq: Two times a day (BID) | CUTANEOUS | 0 refills | Status: AC

## 2020-12-05 NOTE — Progress Notes (Signed)
Subjective:    Patient ID: [name], female    DOB: [birth year], 65 y.o.   MRN: [id number]  [name] is a 65 y.o. female presenting on 12/05/2020 for Hypertension   HPI  Follow-up Thyroid Nodules Low TSH Repeat labs normalized TSH T4. No diagnosis of hypothyroidism Never on medication  Obesity BMI >30 Improved weight - Diet: No particular diet. Reducing tea intake, working on improving water - Exercise:  improving gradual    CHRONIC HTN: Reports no new concerns, some recent SBP readings 130s avg. Elevated BP initially. Current Meds - Lisinopril 40mg  daily, Metoprolol XL 100mg  daily   Reports good compliance, took meds today. Tolerating well, w/o complaints. Denies CP, dyspnea, HA, edema, dizziness / lightheadedness    Chronic Atrial Fibrillation, s/p Mitral Valve Replacement, chronic anticoagulation Pulmonary HTN / PAD - Followed by [facility] Cardiology Dr Stanford Breed, continues to f/u with [facility] Coumadin Clinic, she had ECHO on 11/12/17 with normal LVEF and mild mod pulm HTN, lasix 20mg  every other day was added. Otherwise remains on anticoagulation with coumadin for s/p MVR, rate control for permanent AFib. - They said her heart valve is expired but functioning well - Potassium was normal range. - She has followed Cardiology w/ variable INR, has repeat today.     Depression screen [facility] 2/9 12/05/2020 06/12/2020 06/01/2020  Decreased Interest 0 0 0  Down, Depressed, Hopeless 0 0 0  PHQ - 2 Score 0 0 0  Altered sleeping 0 - -  Tired, decreased energy 0 - -  Change in appetite 0 - -  Feeling bad or failure about yourself  0 - -  Trouble concentrating 0 - -  Moving slowly or fidgety/restless 0 - -  Suicidal thoughts 0 - -  PHQ-9 Score 0 - -  Difficult doing work/chores Not difficult at all - -  Some recent data might be hidden    Social History   Tobacco Use   Smoking status: Never   Smokeless tobacco: Never  Vaping Use   Vaping Use: Never used  Substance Use  Topics   Alcohol use: No   Drug use: No    Review of Systems Per HPI unless specifically indicated above     Objective:    BP 140/80 (BP Location: Left Arm, Cuff Size: Normal)   Pulse 73   Ht 5\' 4"  (1.626 m)   Wt 177 lb 9.6 oz (80.6 kg)   SpO2 100%   BMI 30.48 kg/m   Wt Readings from Last 3 Encounters:  12/05/20 177 lb 9.6 oz (80.6 kg)  11/22/20 177 lb (80.3 kg)  06/12/20 172 lb (78 kg)    Physical Exam Vitals and nursing note reviewed.  Constitutional:      General: She is not in acute distress.    Appearance: She is well-developed. She is not diaphoretic.     Comments: Well-appearing, comfortable, cooperative  HENT:     Head: Normocephalic and atraumatic.  Eyes:     General:        Right eye: No discharge.        Left eye: No discharge.     Conjunctiva/sclera: Conjunctivae normal.     Pupils: Pupils are equal, round, and reactive to light.  Neck:     Thyroid: No thyromegaly.  Cardiovascular:     Rate and Rhythm: Normal rate. Rhythm irregular.     Pulses: Normal pulses.     Heart sounds: Normal heart sounds. No murmur heard.  Comments: Irregularly irregular rhythm. Mechanical heart valve clicking  Pulmonary:     Effort: Pulmonary effort is normal. No respiratory distress.     Breath sounds: Normal breath sounds. No wheezing or rales.  Abdominal:     General: Bowel sounds are normal. There is no distension.     Palpations: Abdomen is soft. There is no mass.     Tenderness: There is no abdominal tenderness.  Musculoskeletal:        General: No tenderness. Normal range of motion.     Cervical back: Normal range of motion and neck supple.     Comments: Upper / Lower Extremities: - Normal muscle tone, strength bilateral upper extremities 5/5, lower extremities 5/5  Lymphadenopathy:     Cervical: No cervical adenopathy.  Skin:    General: Skin is warm and dry.     Findings: No erythema or rash.  Neurological:     Mental Status: She is alert and oriented to  person, place, and time.     Comments: Distal sensation intact to light touch all extremities  Psychiatric:        Mood and Affect: Mood normal.        Behavior: Behavior normal.        Thought Content: Thought content normal.     Comments: Well groomed, good eye contact, normal speech and thoughts   Results for orders placed or performed in visit on 11/28/20  T4, free  Result Value Ref Range   Free T4 1.3 0.8 - 1.8 ng/dL  TSH  Result Value Ref Range   TSH 0.40 0.40 - 4.50 mIU/L  BASIC METABOLIC PANEL WITH GFR  Result Value Ref Range   Glucose, Bld 95 65 - 99 mg/dL   BUN 11 7 - 25 mg/dL   Creat 0.95 0.50 - 0.99 mg/dL   GFR, Est Non African American 62 > OR = 60 mL/min/1.70m2   GFR, Est African American 72 > OR = 60 mL/min/1.70m2   BUN/Creatinine Ratio NOT APPLICABLE 6 - 22 (calc)   Sodium 139 135 - 146 mmol/L   Potassium 3.8 3.5 - 5.3 mmol/L   Chloride 104 98 - 110 mmol/L   CO2 30 20 - 32 mmol/L   Calcium 9.1 8.6 - 10.4 mg/dL      Assessment & Plan:   Problem List Items Addressed This Visit     Pulmonary hypertension ([place])    Stable history of mild pulm HTN Followed by Cardiology, with MVR complication       GOITER, MULTINODULAR - Primary    Stable No active goiter or nodule Thyroid panel normal Off Levothyroxine       Essential hypertension    Controlled HTN repeat manual improved - Home BP readings stable  Complicated by chronic Atrial Fibrillation, MVR, Pulm HTN    Plan:  1. Continue current BP regimen Lisinopril 40mg  daily, Metoprolol XL 100mg  daily 2. Encourage improved lifestyle - low sodium diet, regular exercise 3. Continue monitor BP outside office, bring readings to next visit, if persistently >140/90 or new symptoms notify office sooner       Chronic atrial fibrillation ([place])    Stable AFib, without recurrence RVR. On anticoagulation Followed by [facility] Cardiology on anticoagulation Coumadin clinic Continue rate control Metoprolol        Other Visit Diagnoses     Encounter for screening mammogram for malignant neoplasm of breast       Relevant Orders   MM 3D SCREEN BREAST BILATERAL  Screening for colon cancer       Relevant Orders   Cologuard   Bee sting reaction, accidental or unintentional, initial encounter       Relevant Medications   triamcinolone cream (KENALOG) 0.5 %      Bee Sting - resolved Has area appears slightly swollen nodular spot where sting was on hand, rx Triamcinolone  Mammogram ordered. She can call to schedule   Re ordered Cologuard Send to other address [place], [place] [address]   Orders Placed This Encounter  Procedures   MM 3D SCREEN BREAST BILATERAL    Standing Status:   Future    Standing Expiration Date:   12/05/2021    Order Specific Question:   Reason for Exam (SYMPTOM  OR DIAGNOSIS REQUIRED)    Answer:   Screening bilateral 3D Mammogram [place]    Order Specific Question:   Preferred imaging location?    Answer:   [place], [place] [address]      Meds ordered this encounter  Medications   triamcinolone cream (KENALOG) 0.5 %    Sig: Apply 1 application topically 2 (two) times daily. To affected areas, for up to 2 weeks.    Dispense:  30 g    Refill:  0      Follow up plan: Return in about 6 months (around 06/06/2021) for 6 month fasting lab only then 1 week later Annual Physical.  Future labs ordered for 05/30/21  Nobie Putnam, [facility] 12/05/2020, 8:04 AM

## 2020-12-05 NOTE — Assessment & Plan Note (Signed)
Stable AFib, without recurrence RVR. On anticoagulation Followed by [facility] on anticoagulation Coumadin clinic Continue rate control Metoprolol 

## 2020-12-05 NOTE — Assessment & Plan Note (Signed)
Stable history of mild pulm HTN Followed by Cardiology, with MVR complication

## 2020-12-05 NOTE — Patient Instructions (Signed)
-   skip warfarin tonight & tomorrow, then - continue dosage of warfarin of 1 tablet every day EXCEPT 1/2 tablet on MONDAYS, [name]. - Recheck INR in 2 weeks

## 2020-12-05 NOTE — Patient Instructions (Addendum)
Thank you for coming to the office today.  Re ordered Cologuard.  Check with them on address.  Labs are normal range. Potassium looks good.  Call to schedule Mammogram.  For Mammogram screening for breast cancer   Call the [place] below anytime to schedule your own appointment now that order has been placed.  [facility] [place], [place] [address] Phone: [phone]  DUE for FASTING BLOOD WORK (no food or drink after midnight before the lab appointment, only water or coffee without cream/sugar on the morning of)  SCHEDULE "Lab Only" visit in the morning at the clinic for lab draw in 6 MONTHS   - Make sure Lab Only appointment is at about 1 week before your next appointment, so that results will be available  For Lab Results, once available within 2-3 days of blood draw, you can can log in to MyChart online to view your results and a brief explanation. Also, we can discuss results at next follow-up visit.   Please schedule a Follow-up Appointment to: Return in about 6 months (around 06/06/2021) for 6 month fasting lab only then 1 week later Annual Physical.  If you have any other questions or concerns, please feel free to call the office or send a message through [place]. You may also schedule an earlier appointment if necessary.  Additionally, you may be receiving a survey about your experience at our office within a few days to 1 week by e-mail or mail. We value your feedback.  Nobie Putnam, DO [place]

## 2020-12-05 NOTE — Assessment & Plan Note (Signed)
Controlled HTN repeat manual improved - Home BP readings stable  Complicated by chronic Atrial Fibrillation, MVR, Pulm HTN    Plan:  1. Continue current BP regimen Lisinopril 40mg daily, Metoprolol XL 100mg daily 2. Encourage improved lifestyle - low sodium diet, regular exercise 3. Continue monitor BP outside office, bring readings to next visit, if persistently >140/90 or new symptoms notify office sooner 

## 2020-12-05 NOTE — Assessment & Plan Note (Signed)
Stable No active goiter or nodule Thyroid panel normal Off Levothyroxine

## 2020-12-10 ENCOUNTER — Ambulatory Visit (INDEPENDENT_AMBULATORY_CARE_PROVIDER_SITE_OTHER): Payer: Medicare HMO

## 2020-12-10 ENCOUNTER — Telehealth: Payer: Self-pay | Admitting: General Practice

## 2020-12-10 DIAGNOSIS — E7800 Pure hypercholesterolemia, unspecified: Secondary | ICD-10-CM | POA: Diagnosis not present

## 2020-12-10 DIAGNOSIS — I482 Chronic atrial fibrillation, unspecified: Secondary | ICD-10-CM | POA: Diagnosis not present

## 2020-12-10 DIAGNOSIS — I10 Essential (primary) hypertension: Secondary | ICD-10-CM

## 2020-12-10 NOTE — Patient Instructions (Signed)
Visit Information  PATIENT GOALS:  Goals Addressed   None     Patient verbalizes understanding of instructions provided today and agrees to view in [place].   Telephone follow up appointment with care management team member scheduled for:03-11-2021 at [place], [place] Mobile: [phone]

## 2020-12-10 NOTE — Chronic Care Management (AMB) (Signed)
Chronic Care Management   CCM RN Visit Note  12/10/2020 Name: [name] MRN: [id number] DOB: 07/26/1955  Subjective: [name] is a 65 y.o. year old female who is a primary care patient of Karamalegos, Alexander J, DO. The care management team was consulted for assistance with disease management and care coordination needs.    Engaged with patient by telephone for follow up visit in response to provider referral for case management and/or care coordination services.   Consent to Services:  The patient was given information about Chronic Care Management services, agreed to services, and gave verbal consent prior to initiation of services.  Please see initial visit note for detailed documentation.   Patient agreed to services and verbal consent obtained.   Assessment: Review of patient past medical history, allergies, medications, health status, including review of consultants reports, laboratory and other test data, was performed as part of comprehensive evaluation and provision of chronic care management services.   SDOH (Social Determinants of Health) assessments and interventions performed:    CCM Care Plan  No Known Allergies  Outpatient Encounter Medications as of 12/10/2020  Medication Sig   acyclovir (ZOVIRAX) 400 MG tablet TAKE 1 TABLET BY MOUTH 3 TIMES DAILY FORFIVE TO TEN DAYS OR UNTIL HEALED. FOR FEVER BLISTER.   alendronate (FOSAMAX) 35 MG tablet TAKE 1 TABLET BY MOUTH EVERY 7 DAYS. TAKE WITH A FULL GLASS OF WATER ON AN EMPTY STOMACH.   amitriptyline (ELAVIL) 50 MG tablet Take 50 mg by mouth at bedtime as needed for sleep.    aspirin EC 81 MG tablet Take 1 tablet (81 mg total) by mouth daily.   atorvastatin (LIPITOR) 40 MG tablet TAKE 1 TABLET BY MOUTH ONCE EVERY EVENING   calcium carbonate (OSCAL) 1500 (600 Ca) MG TABS tablet Take 1 tablet by mouth daily.    cetirizine (ZYRTEC) 10 MG tablet TAKE 1 TABLET BY MOUTH ONCE A DAY   Cholecalciferol (VITAMIN D3) 2000  units capsule Take 1 capsule (2,000 Units total) by mouth daily.   cycloSPORINE (RESTASIS) 0.05 % ophthalmic emulsion Place 1 drop into both eyes 2 (two) times daily as needed (dry eyes).   dicyclomine (BENTYL) 10 MG capsule TAKE 1 CAPSULE BY MOUTH 4 TIMES A DAY BEFORE MEALS AND AT BEDTIME.   fluticasone (FLONASE) 50 MCG/ACT nasal spray PLACE 2 SPRAYS INTO BOTH NOSTRILS DAILY   furosemide (LASIX) 20 MG tablet TAKE 1 TABLET BY MOUTH EVERY OTHER DAY   lisinopril (ZESTRIL) 40 MG tablet Take 1 tablet (40 mg total) by mouth daily.   methocarbamol (ROBAXIN) 500 MG tablet Take 1 tablet (500 mg total) by mouth 4 (four) times daily as needed for muscle spasms (pain).   metoprolol succinate (TOPROL-XL) 100 MG 24 hr tablet Take 1.5 tablets (150 mg total) by mouth daily. Take with or immediately following a meal.   Multiple Vitamins-Minerals (CENTRUM SILVER ADULT 50+ PO) Take 1 tablet by mouth daily.   triamcinolone cream (KENALOG) 0.5 % Apply 1 application topically 2 (two) times daily. To affected areas, for up to 2 weeks.   warfarin (COUMADIN) 5 MG tablet TAKE 1 TABLET BY MOUTH ONCE A DAY OR AS DIRECTED BY COUMADIN CLINIC.   No facility-administered encounter medications on file as of 12/10/2020.    Patient Active Problem List   Diagnosis Date Noted   Obesity (BMI 30.0-34.9) 06/08/2018   Elevated hemoglobin A1c 06/18/2017   Allergic rhinitis 12/02/2016   Vitamin D deficiency 12/02/2016   S/P MVR (mitral valve replacement)   06/03/2016   Osteopenia 06/03/2016   Environmental and seasonal allergies 06/03/2016   History of epigastric pain 03/13/2016   Breast microcalcifications 10/15/2015   Piriformis syndrome 10/02/2015   History of femur fracture 04/26/2014   Encounter for therapeutic drug monitoring 07/19/2013   Choledocholithiasis 08/14/2011   Anticoagulated on Coumadin 08/14/2011   Mitral valve disease 08/15/2010   MITRAL VALVE REPLACEMENT, HX OF 04/24/2009   History of hyperthyroidism  04/19/2009   Essential hypertension 10/17/2008   Pulmonary hypertension (HCC) 10/17/2008   RHEUMATIC HEART DISEASE, HX OF 10/17/2008   GOITER, MULTINODULAR 10/12/2007   HYPERCHOLESTEROLEMIA 10/12/2007   Chronic atrial fibrillation (HCC) 10/12/2007   GERD 10/12/2007    Conditions to be addressed/monitored:Atrial Fibrillation, HTN, and HLD  Care Plan : RNCM: Atrial Fibrillation (Adult)  Updates made by Prince Couey A, RN since 12/10/2020 12:00 AM     Problem: RNCM: Dysrhythmia (Atrial Fibrillation)   Priority: Medium     Goal: RNCM: Heart Rate and Rhythm Monitored and Managed   Priority: Medium  Note:   Current Barriers:  Chronic Disease Management support and education needs related to effective management of Afib  Lacks caregiver support.  Lacks social connections Does not contact provider office for questions/concerns  Nurse Case Manager Clinical Goal(s):  Over the next 120 days, patient will verbalize understanding of plan for effective management of AFIB Over the next 120 days, patient will work with RNCM and pcp  to address needs related to AFIB Over the next 120 days, patient will attend all scheduled medical appointments: 12-05-2020 at 0800 am  Interventions:  1:1 collaboration with Karamalegos, Alexander J, DO regarding development and update of comprehensive plan of care as evidenced by provider attestation and co-signature Inter-disciplinary care team collaboration (see longitudinal plan of care) Evaluation of current treatment plan related to Afib and patient's adherence to plan as established by provider. 10-01-2020: The patient is doing well. She is having some issues keeping her INR regulated and is having several blood test. She states she has always been on a roller coaster with her blood work. She goes back this Wednesday for a new check. Works with cardiologist for regulation of medications. 12-10-2020: Patient continues to be well managed.  Patient stated that  one medication, Metoprolol Succinate (Toprol-XL) was increased from 100 mg 1 tab to 150 mg (1.5 tabs) daily on 11/22/2020 following office visit with cardiologist.  Per chart review: On June 1, she had to brief and somewhat fleeting episodes of sharp chest pain occurring over the lower left rib cage at approximately the left mid axillary line, lasting about 2 minutes, and resolving spontaneously.  She said pain was very sharp and worse with palpation.  She mentioned symptoms at anticoagulation clinic and was seen by cardiologist on 11/22/2020.  Patient denies any chest pain, palpitations, dizziness, syncope, or edema today.  Patient compliant with medications.  Continues to attend Coumadin Clinic as scheduled. Advised patient to call the office for changes or questions  Provided education to patient re: changes in Afib, risk for heart attack and stroke  Reviewed scheduled/upcoming provider appointments including: 06-06-2021 at 9:00 am for annual physical.  Coumadin Clinic 12-19-2020. Discussed plans with patient for ongoing care management follow up and provided patient with direct contact information for care management team  Patient Goals/Self-Care Activities Over the next 120 days, patient will:  - Patient will self administer medications as prescribed Patient will attend all scheduled provider appointments Patient will call pharmacy for medication refills Patient will attend church or   other social activities Patient will continue to perform ADL's independently Patient will continue to perform IADL's independently Patient will call provider office for new concerns or questions Patient will work with BSW to address care coordination needs and will continue to work with the clinical team to address health care and disease management related needs.   - activity or exercise based on tolerance encouraged - barriers to lifestyle changes reviewed and addressed - medication-adherence assessment completed -  medication side effects monitored and managed - reassurance provided - rescue (action) plan developed - response to pharmacologic therapy monitored - screen for functional limitations reviewed - rescue (action) plan use encouraged  Follow Up Plan: Telephone follow up appointment with care management team member scheduled for:03-11-2021 at 0900 am        Care Plan : RNCM: Hypertension (Adult)  Updates made by Cavanaugh, Maureen A, RN since 12/10/2020 12:00 AM     Problem: RNCM: Hypertension (Hypertension)   Priority: Medium     Long-Range Goal: RNCM: Hypertension Monitored   Priority: Medium  Note:   Objective:  Last practice recorded BP readings:  BP Readings from Last 3 Encounters:  12/05/20 140/80  11/22/20 140/90  06/01/20 136/80     Most recent eGFR/CrCl: No results found for: EGFR  No components found for: CRCL Current Barriers:  Knowledge Deficits related to basic understanding of hypertension pathophysiology and self care management Limited Social Support Lacks social connections Does not contact provider office for questions/concerns Case Manager Clinical Goal(s):  Over the next 120 days, patient will verbalize understanding of plan for hypertension management Over the next 120 days, patient will attend all scheduled medical appointments: 06-06-2021 Over the next 120 days, patient will demonstrate improved adherence to prescribed treatment plan for hypertension as evidenced by taking all medications as prescribed, monitoring and recording blood pressure as directed, adhering to low sodium/DASH diet Over the next 120 days, patient will demonstrate improved health management independence as evidenced by checking blood pressure as directed and notifying PCP if SBP>160 or DBP > 90, taking all medications as prescribe, and adhering to a low sodium diet as discussed. Over the next 120 days, patient will verbalize basic understanding of hypertension disease process and self  health management plan as evidenced by heart healthy diet, medications compliance, working with CCM team to optimize health and well being.  Interventions:  Collaboration with Karamalegos, Alexander J, DO regarding development and update of comprehensive plan of care as evidenced by provider attestation and co-signature Inter-disciplinary care team collaboration (see longitudinal plan of care) Evaluation of current treatment plan related to hypertension self management and patient's adherence to plan as established by provider. 10-01-2020: The patient currently has good control of her HTN. Monitors he food intake and actually does not like to eat but limits sodium and fried foods. She is happy about the temperature today being in the 80's so she can get out. Denies any new concerns. 12-10-2020: Patient monitoring her blood pressure at home with range 140/60-75. Denies any high or low blood pressure readings.  Denies any lower extremity swelling, no dizziness or light-headedness.  Encouraged her to continue to monitor blood pressure especially because of recent medication dosage adjustment of Toprol-XL from 100 mg to 150 mg daily. Provided education to patient re: stroke prevention, s/s of heart attack and stroke, DASH diet, complications of uncontrolled blood pressure. 12-10-2020: reviewed the importance of a low sodium diet.  Patient talked about eating tomato sandwiches with salt on them only during the summertime.    Encouraged her to moderate her salt intake.  Patient states she does not eat foods with added salt except for the summertime tomato sandwich. Reviewed medications with patient and discussed importance of compliance. 10-01-2020: The patient is compliant with her medications. 12-12-2020: On 11-22-2020, Toprol-XL was increased from 100 mg to 150 mg daily due to episode of chest pain on 11-21-2020.  Patient compliant with all medications.  No further issues or complaints. Discussed plans with patient for  ongoing care management follow up and provided patient with direct contact information for care management team Advised patient, providing education and rationale, to monitor blood pressure daily and record, calling PCP for findings outside established parameters.  Reviewed scheduled/upcoming provider appointments including:  06-06-2021 at 9:00 am Patient Goals/Self-Care Activities Over the next 120 days, patient will:  - Self administers medications as prescribed Attends all scheduled provider appointments Calls provider office for new concerns, questions, or BP outside discussed parameters Checks BP and records as discussed Follows a low sodium diet/DASH diet- is compliant with heart healthy diet - blood pressure trends reviewed - depression screen reviewed - home or ambulatory blood pressure monitoring encouraged Follow Up Plan: Telephone follow up appointment with care management team member scheduled for: 03-11-2021 at 0900 am    Care Plan : RNCM: HLD Management  Updates made by Cavanaugh, Maureen A, RN since 12/10/2020 12:00 AM     Problem: RNCM: HLD Management   Priority: Medium     Long-Range Goal: RNCM: HLD Management   Priority: Medium  Note:   Current Barriers:  Poorly controlled hyperlipidemia, complicated by Afib, not always following a heart healthy diet  Current antihyperlipidemic regimen: Lipitor 40 mg QD Most recent lipid panel:  Lab Results  Component Value Date   CHOL 134 05/25/2020   CHOL 126 05/31/2019   CHOL 170 06/01/2018   Lab Results  Component Value Date   HDL 45 (L) 05/25/2020   HDL 48 (L) 05/31/2019   HDL 50 (L) 06/01/2018    Lab Results  Component Value Date   TRIG 103 05/25/2020   TRIG 85 05/31/2019   TRIG 129 06/01/2018      ASCVD risk enhancing conditions: age >65, HTN, AFIB Lacks social connections Does not contact provider office for questions/concerns  RN Care Manager Clinical Goal(s):  Over the next 120 days, patient will work  with RN Care Manager, providers, and care team towards execution of optimized self-health management plan Over the next 120 days, patient will verbalize understanding of plan for effective management of HLD Over the next 120 days, patient will work with RNCM and pcp to address needs related to changes or question about HLD Over the next 120 days, patient will attend all scheduled medical appointments: 12-05-2020  Interventions: Collaboration with Karamalegos, Alexander J, DO regarding development and update of comprehensive plan of care as evidenced by provider attestation and co-signature Inter-disciplinary care team collaboration (see longitudinal plan of care) Medication review performed; medication list updated in electronic medical record.  Inter-disciplinary care team collaboration (see longitudinal plan of care) Referred to pharmacy team for assistance with HLD medication management Evaluation of current treatment plan related to HLD  and patient's adherence to plan as established by provider. 10-01-2020: The patient is compliant with plan of care for HLD.  12-10-2020:  Patient continues to be compliant with HLD plan of care. Advised patient to call the office for changes in condition or questions Provided education to patient re: heart healthy diet, the patient does not like to   eat, does stay hydrated with water  Reviewed scheduled/upcoming provider appointments including: 06-06-2021 at 9:00 am Discussed plans with patient for ongoing care management follow up and provided patient with direct contact information for care management team   Patient Goals/Self-Care Activities: Over the next 120 days, patient will:   - call for medicine refill 2 or 3 days before it runs out - call if I am sick and can't take my medicine - keep a list of all the medicines I take; vitamins and herbals too - learn to read medicine labels - use a pillbox to sort medicine - use an alarm clock or phone to remind  me to take my medicine - change to whole grain breads, cereal, pasta - drink 6 to 8 glasses of water each day - eat 5 or 6 small meals each day - fill half the plate with nonstarchy vegetables - limit fast food meals to no more than 1 per week - manage portion size - prepare main meal at home 3 to 5 days each week - read food labels for fat, fiber, carbohydrates and portion size - be open to making changes - I can manage, know and watch for signs of a heart attack - if I have chest pain, call for help - learn about small changes that will make a big difference - learn my personal risk factors  - administration or use of medication demonstrated - barriers to medication adherence identified - medication list compiled - medication list reviewed - medication-adherence assessment completed - medication side effects managed - understanding of current medications assessed Follow Up Plan: Telephone follow up appointment with care management team member scheduled for: 03-11-2021 at 9:00 am       Plan:Telephone follow up appointment with care management team member scheduled for:  03-11-2021  Maureen Cavanaugh RN, BSN Community Care Coordinator [place]  [facility] Mobile: [phone]          

## 2020-12-17 ENCOUNTER — Other Ambulatory Visit: Payer: Self-pay

## 2020-12-17 ENCOUNTER — Ambulatory Visit
Admission: RE | Admit: 2020-12-17 | Discharge: 2020-12-17 | Disposition: A | Discharge status: Home / Self Care | Payer: Medicare HMO | Source: Ambulatory Visit | Attending: Family Medicine | Admitting: Family Medicine

## 2020-12-17 DIAGNOSIS — Z1231 Encounter for screening mammogram for malignant neoplasm of breast: Secondary | ICD-10-CM | POA: Insufficient documentation

## 2020-12-19 ENCOUNTER — Ambulatory Visit (INDEPENDENT_AMBULATORY_CARE_PROVIDER_SITE_OTHER): Payer: Medicare HMO | Admitting: Pharmacist

## 2020-12-19 ENCOUNTER — Other Ambulatory Visit: Payer: Self-pay

## 2020-12-19 DIAGNOSIS — I482 Chronic atrial fibrillation, unspecified: Secondary | ICD-10-CM

## 2020-12-19 DIAGNOSIS — Z5181 Encounter for therapeutic drug level monitoring: Secondary | ICD-10-CM

## 2020-12-19 DIAGNOSIS — I4891 Unspecified atrial fibrillation: Secondary | ICD-10-CM | POA: Diagnosis not present

## 2020-12-19 DIAGNOSIS — Z9889 Other specified postprocedural states: Secondary | ICD-10-CM

## 2020-12-19 DIAGNOSIS — I059 Rheumatic mitral valve disease, unspecified: Secondary | ICD-10-CM

## 2020-12-19 LAB — POCT INR: INR: 3.1 — AB (ref 2.0–3.0)

## 2020-12-19 NOTE — Patient Instructions (Addendum)
Description    - continue dosage of warfarin of 1 tablet every day EXCEPT 1/2 tablet on MONDAYS, [place]. - Recheck INR in 2 weeks

## 2021-01-09 ENCOUNTER — Ambulatory Visit (INDEPENDENT_AMBULATORY_CARE_PROVIDER_SITE_OTHER): Payer: Medicare HMO

## 2021-01-09 ENCOUNTER — Other Ambulatory Visit: Payer: Self-pay

## 2021-01-09 DIAGNOSIS — Z5181 Encounter for therapeutic drug level monitoring: Secondary | ICD-10-CM | POA: Diagnosis not present

## 2021-01-09 DIAGNOSIS — I482 Chronic atrial fibrillation, unspecified: Secondary | ICD-10-CM | POA: Diagnosis not present

## 2021-01-09 DIAGNOSIS — Z9889 Other specified postprocedural states: Secondary | ICD-10-CM

## 2021-01-09 DIAGNOSIS — I4891 Unspecified atrial fibrillation: Secondary | ICD-10-CM | POA: Diagnosis not present

## 2021-01-09 DIAGNOSIS — I059 Rheumatic mitral valve disease, unspecified: Secondary | ICD-10-CM | POA: Diagnosis not present

## 2021-01-09 LAB — POCT INR: INR: 3.3 — AB (ref 2.0–3.0)

## 2021-01-09 NOTE — Patient Instructions (Signed)
-   continue dosage of warfarin of 1 tablet every day EXCEPT 1/2 tablet on MONDAYS, [place]. - Recheck INR in 2 weeks

## 2021-01-17 ENCOUNTER — Other Ambulatory Visit: Payer: Self-pay | Admitting: Cardiology

## 2021-01-17 ENCOUNTER — Other Ambulatory Visit: Payer: Self-pay | Admitting: Family Medicine

## 2021-01-17 DIAGNOSIS — J3089 Other allergic rhinitis: Secondary | ICD-10-CM

## 2021-01-17 DIAGNOSIS — M8589 Other specified disorders of bone density and structure, multiple sites: Secondary | ICD-10-CM

## 2021-01-23 ENCOUNTER — Ambulatory Visit (INDEPENDENT_AMBULATORY_CARE_PROVIDER_SITE_OTHER): Payer: Medicare HMO

## 2021-01-23 ENCOUNTER — Other Ambulatory Visit: Payer: Self-pay

## 2021-01-23 DIAGNOSIS — Z5181 Encounter for therapeutic drug level monitoring: Secondary | ICD-10-CM

## 2021-01-23 DIAGNOSIS — I482 Chronic atrial fibrillation, unspecified: Secondary | ICD-10-CM | POA: Diagnosis not present

## 2021-01-23 DIAGNOSIS — I059 Rheumatic mitral valve disease, unspecified: Secondary | ICD-10-CM | POA: Diagnosis not present

## 2021-01-23 DIAGNOSIS — I4891 Unspecified atrial fibrillation: Secondary | ICD-10-CM

## 2021-01-23 DIAGNOSIS — Z9889 Other specified postprocedural states: Secondary | ICD-10-CM

## 2021-01-23 LAB — POCT INR: INR: 3.2 — AB (ref 2.0–3.0)

## 2021-01-23 NOTE — Patient Instructions (Signed)
-   continue dosage of warfarin of 1 tablet every day EXCEPT 1/2 tablet on MONDAYS, [place]. - Recheck INR in 2 weeks

## 2021-02-03 ENCOUNTER — Other Ambulatory Visit
Admission: RE | Admit: 2021-02-03 | Discharge: 2021-02-03 | Disposition: A | Discharge status: Home / Self Care | Payer: Medicare HMO | Source: Ambulatory Visit | Attending: Family Medicine | Admitting: Family Medicine

## 2021-02-03 DIAGNOSIS — R040 Epistaxis: Secondary | ICD-10-CM | POA: Diagnosis not present

## 2021-02-03 DIAGNOSIS — Z7901 Long term (current) use of anticoagulants: Secondary | ICD-10-CM | POA: Diagnosis not present

## 2021-02-03 DIAGNOSIS — U071 COVID-19: Secondary | ICD-10-CM | POA: Insufficient documentation

## 2021-02-03 DIAGNOSIS — Z2089 Contact with and (suspected) exposure to other communicable diseases: Secondary | ICD-10-CM | POA: Diagnosis not present

## 2021-02-03 LAB — PROTIME-INR
INR: 2.8 — ABNORMAL HIGH (ref 0.8–1.2)
Prothrombin Time: 29.8 seconds — ABNORMAL HIGH (ref 11.4–15.2)

## 2021-02-04 ENCOUNTER — Ambulatory Visit (INDEPENDENT_AMBULATORY_CARE_PROVIDER_SITE_OTHER): Payer: Medicare HMO

## 2021-02-04 ENCOUNTER — Telehealth: Payer: Self-pay | Admitting: Family Medicine

## 2021-02-04 ENCOUNTER — Telehealth: Payer: Self-pay | Admitting: Cardiology

## 2021-02-04 DIAGNOSIS — I059 Rheumatic mitral valve disease, unspecified: Secondary | ICD-10-CM

## 2021-02-04 DIAGNOSIS — I482 Chronic atrial fibrillation, unspecified: Secondary | ICD-10-CM | POA: Diagnosis not present

## 2021-02-04 DIAGNOSIS — E042 Nontoxic multinodular goiter: Secondary | ICD-10-CM

## 2021-02-04 DIAGNOSIS — Z9889 Other specified postprocedural states: Secondary | ICD-10-CM | POA: Diagnosis not present

## 2021-02-04 DIAGNOSIS — Z5181 Encounter for therapeutic drug level monitoring: Secondary | ICD-10-CM | POA: Diagnosis not present

## 2021-02-04 NOTE — Telephone Encounter (Signed)
If Home Health RN is calling please get Coumadin Nurse on the phone STAT  1.  Are you calling in regards to an appointment? Cancelled has covid patient had labs done at [facility] please review and call to discuss   2.  Are you calling for a refill ? no  3.  Are you having bleeding issues? no  4.  Do you need clearance to hold Coumadin? No     Please route to the Coumadin Clinic Pool

## 2021-02-04 NOTE — Telephone Encounter (Signed)
Pt is calling to notify Dr. [name] that sh is covid positv. Sh is doing good.

## 2021-02-04 NOTE — Patient Instructions (Signed)
-   take 1 tablet warfarin tonight, then  - continue dosage of warfarin of 1 tablet every day EXCEPT 1/2 tablet on MONDAYS, [place]. - Recheck INR in 2 weeks

## 2021-02-04 NOTE — Telephone Encounter (Signed)
Please see anti-coag note for today. 

## 2021-02-13 NOTE — Progress Notes (Signed)
Cardiology Clinic Note   Patient Name: [name] Date of Encounter: 02/14/2021  Primary Care Provider:  Olin Hauser, DO Primary Cardiologist:  Kirk Ruths, MD  Patient Profile    [name] 65 year old female presents to clinic today for follow-up evaluation of her atypical chest pain and hypertension.  Past Medical History    Past Medical History:  Diagnosis Date   Anticoagulated on warfarin    Arthritis    Heart murmur    History of cardiac catheterization    a. 2008 Cath: nl cors.   History of hyperthyroidism    a. 2003--  PTU [place]   Hypercholesterolemia    Hyperlipidemia    Multinodular goiter    Permanent atrial fibrillation ([place])    a. Dx 1997; b. CHA2DS2VASc = 3-->warfarin (s/p MVR as well); c. 06/2013 Holter: HRs freq in low 100's to 1-teens w/ max rate 179-->beta blocker increased.   Pulmonary hypertension, mild (HCC)    Rheumatic heart disease    S/P MVR (mitral valve replacement)    a. 1997 s/p SJM mechanical MVR 2/2 Sev MS; b. chronic warfarin; c. 10/2017 Echo: EF 55-60%, no rwma, triv AI. Nl fxn of MV prosthesis. Sev dil LA. Mildly to mod increased PA pressures.   Past Surgical History:  Procedure Laterality Date   ANTERIOR CERVICAL DECOMP/DISCECTOMY FUSION  12-21-2001   C5 --  C6   BREAST BIOPSY Left 11/03/2016   Procedure: BREAST BIOPSY WITH NEEDLE LOCALIZATION;  Surgeon: Robert Bellow, MD;  Location: [facility] ORS;  Service: General;  Laterality: Left;   BREAST EXCISIONAL BIOPSY Left 2018   CARDIAC CATHETERIZATION  12-28-2006  DR [facility]   NORMAL CORONARY ARTERIES   CARPAL TUNNEL RELEASE Right 01-20-2007   COLONOSCOPY  2015   ERCP  08/15/2011   Procedure: ENDOSCOPIC RETROGRADE CHOLANGIOPANCREATOGRAPHY (ERCP);  Surgeon: Jeryl Columbia, MD;  Location: [facility] ENDOSCOPY;  Service: Endoscopy;  Laterality: N/A;   ERCP     MULTIPLE--  INCLUDING STENTS, SPHINTEROTOMY'S  STONE REMOVAL    FEMUR IM NAIL Right 04/26/2014   Procedure:  INTRAMEDULLARY (IM) NAIL FEMORAL;  Surgeon: Alta Corning, MD;  Location: [place];  Service: Orthopedics;  Laterality: Right;   I & D EXTREMITY Right 02/07/2013   Procedure: IRRIGATION AND DEBRIDEMENT OF RIGHT LOWER LEG WITH PLACEMENT OF ACELL AND WOULND VAC;  Surgeon: Theodoro Kos, DO;  Location: [place];  Service: Plastics;  Laterality: Right;   LAPAROSCOPIC CHOLECYSTECTOMY  04-08-2002   MITRAL VALVE REPLACEMENT  1997   [facility] JUDE   ORIF ANKLE FRACTURE Right 11/05/2012   Procedure: OPEN REDUCTION INTERNAL FIXATION (ORIF) ANKLE FRACTURE only operating on right;  Surgeon: Alta Corning, MD;  Location: [place];  Service: Orthopedics;  Laterality: Right;   ORIF RIGHT ULNAR FX W/ ILIAC CREST BONE GRAFT  10-06-2008   ORIF WRIST FRACTURE Right 04/26/2014   Procedure: OPEN REDUCTION INTERNAL FIXATION (ORIF) WRIST FRACTURE;  Surgeon: Alta Corning, MD;  Location: [place];  Service: Orthopedics;  Laterality: Right;   TRANSTHORACIC ECHOCARDIOGRAM  06-29-2009  dr wall   normal lvsf/ ef 55-60%/  normal bileaflet mechanical mvr/ moderated dilated la/ moderate tr   WRIST ARTHROSCOPY Right 12-10-2007   debridement and ulnar shortening osteotomy w/ plate    Allergies  No Known Allergies  History of Present Illness    [name] has a PMH of rheumatic heart disease, mitral stenosis status post Saint Jude mechanical mitral valve 1997, permanent atrial fibrillation on  warfarin, HTN, HLD, and multinodular goiter/hyperthyroidism status post PTU.  She underwent cardiac catheterization in 2008 which showed normal coronary anatomy.  In 2015 she wore a cardiac event monitor which showed permanent atrial fibrillation and elevated heart rates.  Her fastest rate was documented to be 179 bpm and her beta-blocker was uptitrated.  When she was seen in the clinic 8/21 she was doing well.  She was seen by Murray Hodgkins, NP 11/22/2020.  At that time she reported that she had done fairly well over the past  year.  She was exercising 3 days/week which included walking about half a mile.  She denied chest pain and increased work of breathing with these activities.  She was checking her blood pressure occasionally at home and noted trends between 130-170 range.  In the clinic during her visit her blood pressure was 140/90.  She reported that on June 1 she had a brief episode of sharp chest pain that occurred in her lower rib cage along the left mid axillary line.  Pain lasted for around 2 minutes and resolved without intervention.  She remarks that the pain was very sharp and increased with palpitation.  She mentioned the episode while in the anticoagulation clinic in [place] and was added on for an office visit.  She reports since the 2 brief episode she had no recurrence of chest discomfort.  She denied palpitations, orthopnea, PND, dizziness, syncope, and edema.  She presents the clinic today for follow-up evaluation states she feels well.  She has had no recurrent episodes of chest discomfort.  She reports that she occasionally feels her heart rate change with her atrial fibrillation but, does not routinely notice her atrial fibrillation.  She denies bleeding issues.  On initial check her blood pressure is 158/76 and on recheck it is 136/78.  She reports that her blood pressure at home runs in the 130s over 80s.  She is walking about 30 minutes every other day and denies chest discomfort.  I will give her the salty 6 diet sheet, have her increase her physical activity to 150 minutes of moderate physical activity per week, and have her follow-up in 6 months.  Today she denies chest pain, shortness of breath, lower extremity edema, fatigue, palpitations, melena, hematuria, hemoptysis, diaphoresis, weakness, presyncope, syncope, orthopnea, and PND.   Home Medications    Prior to Admission medications   Medication Sig Start Date End Date Taking? Authorizing Provider  acyclovir (ZOVIRAX) 400 MG tablet TAKE 1  TABLET BY MOUTH 3 TIMES DAILY FORFIVE TO TEN DAYS OR UNTIL HEALED. FOR FEVER BLISTER. 07/17/20   Karamalegos, Devonne Doughty, DO  alendronate (FOSAMAX) 35 MG tablet TAKE 1 TABLET BY MOUTH EVERY 7 DAYS. TAKE WITH A FULL GLASS OF WATER ON AN EMPTY STOMACH. 01/17/21   Parks Ranger, Devonne Doughty, DO  amitriptyline (ELAVIL) 50 MG tablet Take 50 mg by mouth at bedtime as needed for sleep.  01/03/16   [provider]  aspirin EC 81 MG tablet Take 1 tablet (81 mg total) by mouth daily. 07/17/16   Lelon Perla, MD  atorvastatin (LIPITOR) 40 MG tablet TAKE 1 TABLET BY MOUTH ONCE EVERY EVENING 01/17/21   Lelon Perla, MD  calcium carbonate (OSCAL) 1500 (600 Ca) MG TABS tablet Take 1 tablet by mouth daily.     [provider]  cetirizine (ZYRTEC) 10 MG tablet TAKE 1 TABLET BY MOUTH ONCE A DAY 01/17/21   Karamalegos, Devonne Doughty, DO  Cholecalciferol (VITAMIN D3) 2000 units capsule  Take 1 capsule (2,000 Units total) by mouth daily. 06/18/17   Karamalegos, Devonne Doughty, DO  cycloSPORINE (RESTASIS) 0.05 % ophthalmic emulsion Place 1 drop into both eyes 2 (two) times daily as needed (dry eyes).    [provider]  dicyclomine (BENTYL) 10 MG capsule TAKE 1 CAPSULE BY MOUTH 4 TIMES A DAY BEFORE MEALS AND AT BEDTIME. 11/17/18   Lucilla Lame, MD  fluticasone [facility]) 50 MCG/ACT nasal spray PLACE 2 SPRAYS INTO BOTH NOSTRILS DAILY 07/17/20   Parks Ranger, Devonne Doughty, DO  furosemide (LASIX) 20 MG tablet TAKE 1 TABLET BY MOUTH EVERY OTHER DAY 05/03/20   Lelon Perla, MD  lisinopril (ZESTRIL) 40 MG tablet Take 1 tablet (40 mg total) by mouth daily. 11/22/20   Theora Gianotti, NP  methocarbamol (ROBAXIN) 500 MG tablet Take 1 tablet (500 mg total) by mouth 4 (four) times daily as needed for muscle spasms (pain). 06/01/20   Karamalegos, Devonne Doughty, DO  metoprolol succinate (TOPROL-XL) 100 MG 24 hr tablet Take 1.5 tablets (150 mg total) by mouth daily. Take with or immediately following a meal.  11/22/20 02/20/21  Theora Gianotti, NP  Multiple Vitamins-Minerals (CENTRUM SILVER ADULT 50+ PO) Take 1 tablet by mouth daily.    [provider]  triamcinolone cream (KENALOG) 0.5 % Apply 1 application topically 2 (two) times daily. To affected areas, for up to 2 weeks. 12/05/20   Karamalegos, Devonne Doughty, DO  warfarin (COUMADIN) 5 MG tablet TAKE 1 TABLET BY MOUTH ONCE A DAY OR AS DIRECTED BY COUMADIN CLINIC. 10/25/20   Lelon Perla, MD    Family History    Family History  Problem Relation Age of Onset   Hypothyroidism Sister    Gallbladder disease Sister    Coronary artery disease Other    Diabetes Mother    Hypertension Mother    Heart disease Father    Stroke Father    Breast cancer Neg Hx    She indicated that her mother is deceased. She indicated that her father is deceased. She indicated that her maternal grandmother is deceased. She indicated that her maternal grandfather is deceased. She indicated that her paternal grandmother is deceased. She indicated that her paternal grandfather is deceased. She indicated that the status of her neg hx is unknown. She indicated that the status of her other is unknown.  Social History    Social History   Socioeconomic History   Marital status: Divorced    Spouse name: Not on file   Number of children: 0   Years of education: 12   Highest education level: 12th grade  Occupational History   Occupation: Control and instrumentation engineer    Comment: retired  Tobacco Use   Smoking status: Never   Smokeless tobacco: Never  Scientific laboratory technician Use: Never used  Substance and Sexual Activity   Alcohol use: No   Drug use: No   Sexual activity: Not on file  Other Topics Concern   Not on file  Social History Narrative   Not on file   Social Determinants of Health   Financial Resource Strain: Low Risk    Difficulty of Paying Living Expenses: Not hard at all  Food Insecurity: No Food Insecurity   Worried About Charity fundraiser  in the Last Year: Never true   Hometown in the Last Year: Never true  Transportation Needs: No Transportation Needs   Lack of Transportation (Medical): No   Lack of Transportation (Non-Medical):  No  Physical Activity: Inactive   Days of Exercise per Week: 0 days   Minutes of Exercise per Session: 0 min  Stress: No Stress Concern Present   Feeling of Stress : Not at all  Social Connections: Not on file  Intimate Partner Violence: Not on file     Review of Systems    General:  No chills, fever, night sweats or weight changes.  Cardiovascular:  No chest pain, dyspnea on exertion, edema, orthopnea, palpitations, paroxysmal nocturnal dyspnea. Dermatological: No rash, lesions/masses Respiratory: No cough, dyspnea Urologic: No hematuria, dysuria Abdominal:   No nausea, vomiting, diarrhea, bright red blood per rectum, melena, or hematemesis Neurologic:  No visual changes, wkns, changes in mental status. All other systems reviewed and are otherwise negative except as noted above.  Physical Exam    VS:  BP 136/78 (BP Location: Left Arm, Patient Position: Sitting, Cuff Size: Normal)   Pulse (!) 54   Ht '5\' 4"'$  (1.626 m)   Wt 173 lb 6.4 oz (78.7 kg)   BMI 29.76 kg/m  , BMI Body mass index is 29.76 kg/m. GEN: Well nourished, well developed, in no acute distress. HEENT: normal. Neck: Supple, no JVD, carotid bruits, or masses. Cardiac: RRR, no murmurs, rubs, or gallops. No clubbing, cyanosis, edema.  Radials/DP/PT 2+ and equal bilaterally.  Respiratory:  Respirations regular and unlabored, clear to auscultation bilaterally. GI: Soft, nontender, nondistended, BS + x 4. MS: no deformity or atrophy. Skin: warm and dry, no rash. Neuro:  Strength and sensation are intact. Psych: Normal affect.  Accessory Clinical Findings    Recent Labs: 05/25/2020: ALT 12; Hemoglobin 11.7; Platelets 158 11/28/2020: BUN 11; Creat 0.95; Potassium 3.8; Sodium 139; TSH 0.40   Recent Lipid Panel     Component Value Date/Time   CHOL 134 05/25/2020 0802   CHOL 157 10/01/2016 0919   TRIG 103 05/25/2020 0802   HDL 45 (L) 05/25/2020 0802   HDL 46 10/01/2016 0919   CHOLHDL 3.0 05/25/2020 0802   VLDL 20 06/05/2015 0926   LDLCALC 70 05/25/2020 0802   LDLDIRECT 192.5 11/04/2010 1131    ECG personally reviewed by me today-none today.  EKG 11/22/2020 Atrial fibrillation 89 bpm no ST or T wave changes  Echocardiogram 11/12/2017 Study Conclusions   - Left ventricle: The cavity size was normal. Wall thickness was    normal. Systolic function was normal. The estimated ejection    fraction was in the range of 55% to 60%. Wall motion was normal;    there were no regional wall motion abnormalities. Doppler    parameters are consistent with high ventricular filling pressure.  - Aortic valve: There was trivial regurgitation.  - Mitral valve: A mechanical prosthesis was present. Valve area by    continuity equation (using LVOT flow): 1.71 cm^2.  - Left atrium: The atrium was severely dilated.  - Right ventricle: The cavity size was mildly dilated.  - Pulmonary arteries: Systolic pressure was mildly to moderately    increased.   Impressions:   - Normal LV function; elevated LV fillinng pressure; s/p MVR with    normal mean gradient 5 mmHg; severe LAE; mild RVE; mild TR with    mild to moderate pulmonary hypertension.   Assessment & Plan   1.  Atypical chest pain/left-sided chest discomfort-no further episodes of chest discomfort, arm, neck, or back discomfort.  She presented to clinic on 11/22/2020 after being seen in the anticoagulation clinic.  She had 2 brief episodes of bleeding sharp left-sided  chest discomfort that resolved without intervention.  Her BMP 6/22 was unremarkable. Continue to monitor  Essential hypertension-BP today.  Much better control with metoprolol succinate 150 mg. Continue metoprolol, lisinopril, furosemide Heart healthy low-sodium diet-salty 6 given Increase  physical activity as tolerated Maintain blood pressure log  Hyperlipidemia-LDL 70 on 12/21 Continue atorvastatin Heart healthy low-sodium high-fiber diet Increase physical activity as tolerated  Mitral stenosis-no increased DOE or activity intolerance.  Status post mechanical valve replacement 1997.  Echocardiogram 2019 showed well-functioning mechanical valve.  Follows with [place] anticoagulation clinic for INR.  On 01/23/2021 INR was 3.2 Continue SBE prophylaxis  Permanent atrial fibrillation-heart rate today 54 BPM.  No recent episodes of accelerated heart rate. Continue metoprolol Heart healthy low-sodium diet-salty 6 given Increase physical activity as tolerated Avoid triggers caffeine, chocolate, EtOH, dehydration etc.  Disposition: Follow-up with Dr. Stanford Breed in 6 months.  Jossie Ng. Miller [place] NP-C    02/14/2021, 8:16 AM [place] [address] Office [phone] Fax [phone]  Notice: This dictation was prepared with Dragon dictation along with smaller phrase technology. Any transcriptional errors that result from this process are unintentional and may not be corrected upon review.  I spent 13 minutes examining this patient, reviewing medications, and using patient centered shared decision making involving her cardiac care.  Prior to her visit I spent greater than 20 minutes reviewing her past medical history,  medications, and prior cardiac tests.

## 2021-02-14 ENCOUNTER — Encounter: Payer: Self-pay | Admitting: General Practice

## 2021-02-14 ENCOUNTER — Ambulatory Visit: Payer: Medicare HMO | Admitting: General Practice

## 2021-02-14 ENCOUNTER — Other Ambulatory Visit: Payer: Self-pay

## 2021-02-14 VITALS — BP 136/78 | HR 54 | Ht 64.0 in | Wt 173.4 lb

## 2021-02-14 DIAGNOSIS — E782 Mixed hyperlipidemia: Secondary | ICD-10-CM | POA: Diagnosis not present

## 2021-02-14 DIAGNOSIS — I10 Essential (primary) hypertension: Secondary | ICD-10-CM | POA: Diagnosis not present

## 2021-02-14 DIAGNOSIS — I482 Chronic atrial fibrillation, unspecified: Secondary | ICD-10-CM

## 2021-02-14 DIAGNOSIS — I059 Rheumatic mitral valve disease, unspecified: Secondary | ICD-10-CM | POA: Diagnosis not present

## 2021-02-14 DIAGNOSIS — R0789 Other chest pain: Secondary | ICD-10-CM | POA: Diagnosis not present

## 2021-02-14 NOTE — Patient Instructions (Signed)
Medication Instructions:  The current medical regimen is effective;  continue present plan and medications as directed. Please refer to the Current Medication list given to you today.  *If you need a refill on your cardiac medications before your next appointment, please call your pharmacy*  Lab Work:   Testing/Procedures:  NONE    NONE  Special Instructions PLEASE READ AND FOLLOW SALTY 6-ATTACHED-1,'800mg'$  daily  PLEASE INCREASE PHYSICAL ACTIVITY AS TOLERATED, GOAL IS 150 MINUTES OF MODERATE PHYSICAL ACTIVITY WEEKLY  Follow-Up: Your next appointment:  6 month(s) In Person with Kirk Ruths, MD   At [facility], you and your health needs are our priority.  As part of our continuing mission to provide you with exceptional heart care, we have created designated Provider Care Teams.  These Care Teams include your primary Cardiologist (physician) and Advanced Practice Providers (APPs -  Physician Assistants and Nurse Practitioners) who all work together to provide you with the care you need, when you need it.            6 SALTY THINGS TO AVOID     1,'800MG'$  DAILY

## 2021-02-20 ENCOUNTER — Other Ambulatory Visit: Payer: Self-pay

## 2021-02-20 ENCOUNTER — Ambulatory Visit: Payer: Medicare HMO

## 2021-02-20 ENCOUNTER — Other Ambulatory Visit
Admission: RE | Admit: 2021-02-20 | Discharge: 2021-02-20 | Disposition: A | Discharge status: Home / Self Care | Payer: Medicare HMO | Source: Ambulatory Visit | Attending: Internal Medicine | Admitting: Internal Medicine

## 2021-02-20 DIAGNOSIS — I482 Chronic atrial fibrillation, unspecified: Secondary | ICD-10-CM

## 2021-02-20 DIAGNOSIS — I4891 Unspecified atrial fibrillation: Secondary | ICD-10-CM

## 2021-02-20 DIAGNOSIS — Z5181 Encounter for therapeutic drug level monitoring: Secondary | ICD-10-CM

## 2021-02-20 DIAGNOSIS — Z9889 Other specified postprocedural states: Secondary | ICD-10-CM

## 2021-02-20 DIAGNOSIS — I059 Rheumatic mitral valve disease, unspecified: Secondary | ICD-10-CM

## 2021-02-20 DIAGNOSIS — R791 Abnormal coagulation profile: Secondary | ICD-10-CM

## 2021-02-20 LAB — PROTIME-INR
INR: 6.1 (ref 0.8–1.2)
Prothrombin Time: 54.2 seconds — ABNORMAL HIGH (ref 11.4–15.2)

## 2021-02-20 LAB — POCT INR: INR: 8 — AB (ref 2.0–3.0)

## 2021-02-20 NOTE — Patient Instructions (Addendum)
-   skip warfarin tonight, tomorrow and Friday, then  - on Saturday resume dosage of warfarin of 1 tablet every day EXCEPT 1/2 tablet on MONDAYS, [place]. - Recheck INR in 1 week

## 2021-02-27 ENCOUNTER — Other Ambulatory Visit: Payer: Self-pay

## 2021-02-27 ENCOUNTER — Ambulatory Visit: Payer: Medicare HMO

## 2021-02-27 DIAGNOSIS — I4891 Unspecified atrial fibrillation: Secondary | ICD-10-CM | POA: Diagnosis not present

## 2021-02-27 DIAGNOSIS — I059 Rheumatic mitral valve disease, unspecified: Secondary | ICD-10-CM

## 2021-02-27 DIAGNOSIS — Z9889 Other specified postprocedural states: Secondary | ICD-10-CM

## 2021-02-27 DIAGNOSIS — I482 Chronic atrial fibrillation, unspecified: Secondary | ICD-10-CM | POA: Diagnosis not present

## 2021-02-27 DIAGNOSIS — Z5181 Encounter for therapeutic drug level monitoring: Secondary | ICD-10-CM | POA: Diagnosis not present

## 2021-02-27 LAB — POCT INR: INR: 5.4 — AB (ref 2.0–3.0)

## 2021-02-27 NOTE — Patient Instructions (Signed)
-   skip warfarin tonight, tomorrow, then - resume dosage of warfarin of 1 tablet every day EXCEPT 1/2 tablet on MONDAYS, [place]. - Recheck INR in 1 week

## 2021-02-28 NOTE — Telephone Encounter (Signed)
Copied from [place] [id number]. Topic: Referral - Request for Referral >> Feb 28, 2021  2:21 PM Oneta Rack wrote: Dr. Renato Shin Endocrinologist [place], [phone] requesting a referral from PCP due to past thyroid condition.

## 2021-02-28 NOTE — Telephone Encounter (Signed)
Referral sent to Dr Loanne Drilling Endocrinology  Nobie Putnam, [facility] 02/28/2021, 5:55 PM

## 2021-03-06 ENCOUNTER — Other Ambulatory Visit: Payer: Self-pay

## 2021-03-06 ENCOUNTER — Ambulatory Visit (INDEPENDENT_AMBULATORY_CARE_PROVIDER_SITE_OTHER): Payer: Medicare HMO

## 2021-03-06 DIAGNOSIS — I482 Chronic atrial fibrillation, unspecified: Secondary | ICD-10-CM

## 2021-03-06 DIAGNOSIS — I4891 Unspecified atrial fibrillation: Secondary | ICD-10-CM

## 2021-03-06 DIAGNOSIS — I059 Rheumatic mitral valve disease, unspecified: Secondary | ICD-10-CM

## 2021-03-06 DIAGNOSIS — Z9889 Other specified postprocedural states: Secondary | ICD-10-CM | POA: Diagnosis not present

## 2021-03-06 DIAGNOSIS — Z5181 Encounter for therapeutic drug level monitoring: Secondary | ICD-10-CM

## 2021-03-06 LAB — POCT INR: INR: 4.3 — AB (ref 2.0–3.0)

## 2021-03-06 NOTE — Patient Instructions (Signed)
-   skip warfarin tonight, then - START NEW DOSAGE of warfarin of 1/2  tablet every day EXCEPT 1 tablet on MONDAYS & FRIDAYS. - Recheck INR in 1 week

## 2021-03-11 ENCOUNTER — Ambulatory Visit (INDEPENDENT_AMBULATORY_CARE_PROVIDER_SITE_OTHER): Payer: Medicare HMO

## 2021-03-11 ENCOUNTER — Telehealth: Payer: Medicare HMO | Admitting: General Practice

## 2021-03-11 DIAGNOSIS — I10 Essential (primary) hypertension: Secondary | ICD-10-CM

## 2021-03-11 DIAGNOSIS — E7800 Pure hypercholesterolemia, unspecified: Secondary | ICD-10-CM

## 2021-03-11 DIAGNOSIS — I482 Chronic atrial fibrillation, unspecified: Secondary | ICD-10-CM

## 2021-03-11 NOTE — Chronic Care Management (AMB) (Signed)
Chronic Care Management   CCM RN Visit Note  03/11/2021 Name: [name] MRN: [id number] DOB: 1955/12/06  Subjective: [name] is a 65 y.o. year old female who is a primary care patient of Joanna Hauser, DO. The care management team was consulted for assistance with disease management and care coordination needs.    Engaged with patient by telephone for follow up visit in response to provider referral for case management and/or care coordination services.   Consent to Services:  The patient was given information about Chronic Care Management services, agreed to services, and gave verbal consent prior to initiation of services.  Please see initial visit note for detailed documentation.   Patient agreed to services and verbal consent obtained.   Assessment: Review of patient past medical history, allergies, medications, health status, including review of consultants reports, laboratory and other test data, was performed as part of comprehensive evaluation and provision of chronic care management services.   SDOH (Social Determinants of Health) assessments and interventions performed:  SDOH Interventions    Flowsheet Row Most Recent Value  SDOH Interventions   Physical Activity Interventions Other (Comments)  [the patient states she is more active now and walks about 7 hours a week]  Social Connections Interventions Other (Comment)  [good support system]        CCM Care Plan  No Known Allergies  Outpatient Encounter Medications as of 03/11/2021  Medication Sig   acyclovir (ZOVIRAX) 400 MG tablet TAKE 1 TABLET BY MOUTH 3 TIMES DAILY FORFIVE TO TEN DAYS OR UNTIL HEALED. FOR FEVER BLISTER.   alendronate (FOSAMAX) 35 MG tablet TAKE 1 TABLET BY MOUTH EVERY 7 DAYS. TAKE WITH A FULL GLASS OF WATER ON AN EMPTY STOMACH.   amitriptyline (ELAVIL) 50 MG tablet Take 50 mg by mouth at bedtime as needed for sleep.    aspirin EC 81 MG tablet Take 1 tablet (81 mg total) by  mouth daily.   atorvastatin (LIPITOR) 40 MG tablet TAKE 1 TABLET BY MOUTH ONCE EVERY EVENING   calcium carbonate (OSCAL) 1500 (600 Ca) MG TABS tablet Take 1 tablet by mouth daily.    cetirizine (ZYRTEC) 10 MG tablet TAKE 1 TABLET BY MOUTH ONCE A DAY   Cholecalciferol (VITAMIN D3) 2000 units capsule Take 1 capsule (2,000 Units total) by mouth daily.   dicyclomine (BENTYL) 10 MG capsule TAKE 1 CAPSULE BY MOUTH 4 TIMES A DAY BEFORE MEALS AND AT BEDTIME.   fluticasone (FLONASE) 50 MCG/ACT nasal spray PLACE 2 SPRAYS INTO BOTH NOSTRILS DAILY   furosemide (LASIX) 20 MG tablet TAKE 1 TABLET BY MOUTH EVERY OTHER DAY   lisinopril (ZESTRIL) 40 MG tablet Take 1 tablet (40 mg total) by mouth daily.   methocarbamol (ROBAXIN) 500 MG tablet Take 1 tablet (500 mg total) by mouth 4 (four) times daily as needed for muscle spasms (pain).   metoprolol succinate (TOPROL-XL) 100 MG 24 hr tablet Take 1.5 tablets (150 mg total) by mouth daily. Take with or immediately following a meal.   Multiple Vitamins-Minerals (CENTRUM SILVER ADULT 50+ PO) Take 1 tablet by mouth daily.   triamcinolone cream (KENALOG) 0.5 % Apply 1 application topically 2 (two) times daily. To affected areas, for up to 2 weeks.   warfarin (COUMADIN) 5 MG tablet TAKE 1 TABLET BY MOUTH ONCE A DAY OR AS DIRECTED BY COUMADIN CLINIC.   No facility-administered encounter medications on file as of 03/11/2021.    Patient Active Problem List   Diagnosis Date Noted  Obesity (BMI 30.0-34.9) 06/08/2018   Elevated hemoglobin A1c 06/18/2017   Allergic rhinitis 12/02/2016   Vitamin D deficiency 12/02/2016   S/P MVR (mitral valve replacement) 06/03/2016   Osteopenia 06/03/2016   Environmental and seasonal allergies 06/03/2016   History of epigastric pain 03/13/2016   Breast microcalcifications 10/15/2015   Piriformis syndrome 10/02/2015   History of femur fracture 04/26/2014   Encounter for therapeutic drug monitoring 07/19/2013   Choledocholithiasis  08/14/2011   Anticoagulated on Coumadin 08/14/2011   Mitral valve disease 08/15/2010   MITRAL VALVE REPLACEMENT, HX OF 04/24/2009   History of hyperthyroidism 04/19/2009   Essential hypertension 10/17/2008   Pulmonary hypertension ([place]) 10/17/2008   RHEUMATIC HEART DISEASE, HX OF 10/17/2008   GOITER, MULTINODULAR 10/12/2007   HYPERCHOLESTEROLEMIA 10/12/2007   Chronic atrial fibrillation ([place]) 10/12/2007   GERD 10/12/2007    Conditions to be addressed/monitored:Atrial Fibrillation, HTN, and HLD  Care Plan : RNCM: Atrial Fibrillation (Adult)  Updates made by [name], RN since 03/11/2021 12:00 AM     Problem: RNCM: Dysrhythmia (Atrial Fibrillation)   Priority: Medium     Goal: RNCM: Heart Rate and Rhythm Monitored and Managed   Start Date: 08/06/2020  Expected End Date: 12/10/2021  This Visit's Progress: On track  Priority: Medium  Note:   Current Barriers:  Chronic Disease Management support and education needs related to effective management of Afib  Lacks caregiver support.  Lacks social connections Does not contact provider office for questions/concerns  Nurse Case Manager Clinical Goal(s):   patient will verbalize understanding of plan for effective management of AFIB patient will work with [facility] and pcp  to address needs related to AFIB patient will attend all scheduled medical appointments: 06-06-2021 at 0900 am  Interventions:  1:1 collaboration with Joanna Hauser, DO regarding development and update of comprehensive plan of care as evidenced by provider attestation and co-signature Inter-disciplinary care team collaboration (see longitudinal plan of care) Evaluation of current treatment plan related to Afib and patient's adherence to plan as established by provider. 10-01-2020: The patient is doing well. She is having some issues keeping her INR regulated and is having several blood test. She states she has always been on a roller coaster with her blood  work. She goes back this Wednesday for a new check. Works with cardiologist for regulation of medications. 12-10-2020: Patient continues to be well managed.  Patient stated that one medication, Metoprolol Succinate (Toprol-XL) was increased from 100 mg 1 tab to 150 mg (1.5 tabs) daily on 11/22/2020 following office visit with cardiologist.  Per chart review: On June 1, she had to brief and somewhat fleeting episodes of sharp chest pain occurring over the lower left rib cage at approximately the left mid axillary line, lasting about 2 minutes, and resolving spontaneously.  She said pain was very sharp and worse with palpation.  She mentioned symptoms at anticoagulation clinic and was seen by cardiologist on 11/22/2020.  Patient denies any chest pain, palpitations, dizziness, syncope, or edema today.  Patient compliant with medications.  Continues to attend Coumadin Clinic as scheduled. 03-11-2021: The patient has been having to have labwork frequently for erratic INRs. The patient is working with cardiology and she states that her blood work has always been with fluctuations. She denies any acute distress.  Advised patient to call the office for changes or questions  Provided education to patient re: changes in Afib, risk for heart attack and stroke  Reviewed scheduled/upcoming provider appointments including: 06-06-2021 at 9:00 am for  annual physical.  Coumadin Clinic on a regular basis.  Discussed plans with patient for ongoing care management follow up and provided patient with direct contact information for care management team  Patient Goals/Self-Care Activities  patient will:  - Patient will self administer medications as prescribed Patient will attend all scheduled provider appointments Patient will call pharmacy for medication refills Patient will attend church or other social activities Patient will continue to perform ADL's independently Patient will continue to perform IADL's independently Patient  will call provider office for new concerns or questions Patient will work with BSW to address care coordination needs and will continue to work with the clinical team to address health care and disease management related needs.   - activity or exercise based on tolerance encouraged - barriers to lifestyle changes reviewed and addressed - medication-adherence assessment completed - medication side effects monitored and managed - reassurance provided - rescue (action) plan developed - response to pharmacologic therapy monitored - screen for functional limitations reviewed - rescue (action) plan use encouraged  Follow Up Plan: Telephone follow up appointment with care management team member scheduled for: 05-06-2021 at 0940 am        Task: RNCM: Alleviate Barriers to Dysrhythmia Management Completed 03/11/2021  Outcome: Positive  Note:   Care Management Activities:    - activity or exercise based on tolerance encouraged - barriers to lifestyle changes reviewed and addressed - medication-adherence assessment completed - medication side effects monitored and managed - reassurance provided - rescue (action) plan developed - response to pharmacologic therapy monitored - screen for functional limitations reviewed - rescue (action) plan use encouraged        Care Plan : RNCM: Hypertension (Adult)  Updates made by [name], RN since 03/11/2021 12:00 AM     Problem: RNCM: Hypertension (Hypertension)   Priority: Medium     Long-Range Goal: RNCM: Hypertension Monitored   Start Date: 08/06/2020  Expected End Date: 12/20/2020  This Visit's Progress: On track  Priority: Medium  Note:   Objective:  Last practice recorded BP readings:  BP Readings from Last 3 Encounters:  02/14/21 136/78  12/05/20 140/80  11/22/20 140/90     Most recent eGFR/CrCl: No results found for: EGFR  No components found for: CRCL Current Barriers:  Knowledge Deficits related to basic understanding  of hypertension pathophysiology and self care management Limited Social Support Lacks social connections Does not contact provider office for questions/concerns Case Manager Clinical Goal(s):  patient will verbalize understanding of plan for hypertension management  patient will attend all scheduled medical appointments: 06-06-2021  patient will demonstrate improved adherence to prescribed treatment plan for hypertension as evidenced by taking all medications as prescribed, monitoring and recording blood pressure as directed, adhering to low sodium/DASH diet  patient will demonstrate improved health management independence as evidenced by checking blood pressure as directed and notifying PCP if SBP>160 or DBP > 90, taking all medications as prescribe, and adhering to a low sodium diet as discussed.  patient will verbalize basic understanding of hypertension disease process and self health management plan as evidenced by heart healthy diet, medications compliance, working with CCM team to optimize health and well being.  Interventions:  Collaboration with Joanna Hauser, DO regarding development and update of comprehensive plan of care as evidenced by provider attestation and co-signature Inter-disciplinary care team collaboration (see longitudinal plan of care) Evaluation of current treatment plan related to hypertension self management and patient's adherence to plan as established by provider. 10-01-2020: The  patient currently has good control of her HTN. Monitors he food intake and actually does not like to eat but limits sodium and fried foods. She is happy about the temperature today being in the 80's so she can get out. Denies any new concerns. 12-10-2020: Patient monitoring her blood pressure at home with range 140/60-75. Denies any high or low blood pressure readings.  Denies any lower extremity swelling, no dizziness or light-headedness.  Encouraged her to continue to monitor blood  pressure especially because of recent medication dosage adjustment of Toprol-XL from 100 mg to 150 mg daily.03-11-2021: The patient is doing well with the management of her blood pressures. Denies any acute findings. States that she is continuing to monitor at home. Denies any edema bilateral lower extremities. Will continue to monitor.  Provided education to patient re: stroke prevention, s/s of heart attack and stroke, DASH diet, complications of uncontrolled blood pressure. 12-10-2020: reviewed the importance of a low sodium diet.  Patient talked about eating tomato sandwiches with salt on them only during the summertime.  Encouraged her to moderate her salt intake.  Patient states she does not eat foods with added salt except for the summertime tomato sandwich. 03-11-2021: Review of heart healthy diet and monitoring for hidden sodium in foods. Will continue to monitor.  Reviewed medications with patient and discussed importance of compliance. 10-01-2020: The patient is compliant with her medications. 12-12-2020: On 11-22-2020, Toprol-XL was increased from 100 mg to 150 mg daily due to episode of chest pain on 11-21-2020.  Patient compliant with all medications.  No further issues or complaints.03-11-2021: Compliant with medications. Will continue to monitor.  Discussed plans with patient for ongoing care management follow up and provided patient with direct contact information for care management team Advised patient, providing education and rationale, to monitor blood pressure daily and record, calling PCP for findings outside established parameters.  Reviewed scheduled/upcoming provider appointments including:  06-06-2021 at 9:00 am Patient Goals/Self-Care Activities patient will:  - Self administers medications as prescribed Attends all scheduled provider appointments Calls provider office for new concerns, questions, or BP outside discussed parameters Checks BP and records as discussed Follows a low sodium  diet/DASH diet- is compliant with heart healthy diet - blood pressure trends reviewed - depression screen reviewed - home or ambulatory blood pressure monitoring encouraged Follow Up Plan: Telephone follow up appointment with care management team member scheduled for: 05-06-2021 at 0945 am    Task: RNCM: Identify and Monitor Blood Pressure Elevation Completed 03/11/2021  Outcome: Positive  Note:   Care Management Activities:    - blood pressure trends reviewed - depression screen reviewed - home or ambulatory blood pressure monitoring encouraged        Care Plan : RNCM: HLD Management  Updates made by [name], RN since 03/11/2021 12:00 AM     Problem: RNCM: HLD Management   Priority: Medium     Long-Range Goal: RNCM: HLD Management   Start Date: 08/06/2020  Expected End Date: 08/17/2021  This Visit's Progress: On track  Priority: Medium  Note:   Current Barriers:  Poorly controlled hyperlipidemia, complicated by Afib, not always following a heart healthy diet  Current antihyperlipidemic regimen: Lipitor 40 mg QD Most recent lipid panel:  Lab Results  Component Value Date   CHOL 134 05/25/2020   CHOL 126 05/31/2019   CHOL 170 06/01/2018   Lab Results  Component Value Date   HDL 45 (L) 05/25/2020   HDL 48 (L) 05/31/2019  HDL 50 (L) 06/01/2018    Lab Results  Component Value Date   TRIG 103 05/25/2020   TRIG 85 05/31/2019   TRIG 129 06/01/2018      ASCVD risk enhancing conditions: age >14, HTN, AFIB Lacks social connections Does not contact provider office for [place]):  patient will work with Consulting civil engineer, providers, and care team towards execution of optimized self-health management plan  patient will verbalize understanding of plan for effective management of HLD patient will work with [facility] and pcp to address needs related to changes or question about HLD  patient will attend all scheduled medical  appointments: 12-05-2020  Interventions: Collaboration with Joanna Hauser, DO regarding development and update of comprehensive plan of care as evidenced by provider attestation and co-signature Inter-disciplinary care team collaboration (see longitudinal plan of care) Medication review performed; medication list updated in electronic medical record.  Inter-disciplinary care team collaboration (see longitudinal plan of care) Referred to pharmacy team for assistance with HLD medication management Evaluation of current treatment plan related to HLD  and patient's adherence to plan as established by provider. 10-01-2020: The patient is compliant with plan of care for HLD.  03-11-2021:  Patient continues to be compliant with HLD plan of care. Advised patient to call the office for changes in condition or questions Provided education to patient re: heart healthy diet, the patient does not like to eat, does stay hydrated with water  Reviewed scheduled/upcoming provider appointments including: 06-06-2021 at 9:00 am Discussed plans with patient for ongoing care management follow up and provided patient with direct contact information for care management team   Patient Goals/Self-Care Activities:  patient will:   - call for medicine refill 2 or 3 days before it runs out - call if I am sick and can't take my medicine - keep a list of all the medicines I take; vitamins and herbals too - learn to read medicine labels - use a pillbox to sort medicine - use an alarm clock or phone to remind me to take my medicine - change to whole grain breads, cereal, pasta - drink 6 to 8 glasses of water each day - eat 5 or 6 small meals each day - fill half the plate with nonstarchy vegetables - limit fast food meals to no more than 1 per week - manage portion size - prepare main meal at home 3 to 5 days each week - read food labels for fat, fiber, carbohydrates and portion size - be open to making  changes - I can manage, know and watch for signs of a heart attack - if I have chest pain, call for help - learn about small changes that will make a big difference - learn my personal risk factors  - administration or use of medication demonstrated - barriers to medication adherence identified - medication list compiled - medication list reviewed - medication-adherence assessment completed - medication side effects managed - understanding of current medications assessed Follow Up Plan: Telephone follow up appointment with care management team member scheduled for: 05-06-2021 at 9:45 am      Task: RNCM: HLD management Completed 03/11/2021  Outcome: Positive  Note:   Care Management Activities:    - administration or use of medication demonstrated - barriers to medication adherence identified - medication list compiled - medication list reviewed - medication-adherence assessment completed - medication side effects managed - understanding of current medications assessed       Plan:Telephone  follow up appointment with care management team member scheduled for:  05-06-2021 at [place] am  Noreene Larsson RN, MSN, [place] Mobile: [phone]

## 2021-03-11 NOTE — Patient Instructions (Signed)
Visit Information  PATIENT GOALS:  Goals Addressed             This Visit's Progress    RNCM: Track and Manage Heart Rate and Rhythm-Atrial Fibrillation       Timeframe:  Long-Range Goal Priority:  High Start Date:   03-11-2021                          Expected End Date:        03-11-2022               Follow Up Date 06/06/2021    - check pulse (heart) rate before taking medicine - make a plan to exercise regularly - make a plan to eat healthy - keep all lab appointments - take medicine as prescribed    Why is this important?   Atrial fibrillation may have no symptoms. Sometimes the symptoms get worse or happen more often.  It is important to keep track of what your symptoms are and when they happen.  A change in symptoms is important to discuss with your doctor or nurse.  Being active and healthy eating will also help you manage your heart condition.     Notes: 03-11-2021: The patient is having to have frequent blood work to test pt and INR. The patient states it has been on a "roller coaster". The patient states that she has always been like this. Is compliant with medications and recommendations from her provider. Will continue to monitor for changes.         Patient verbalizes understanding of instructions provided today and agrees to view in [place].   Telephone follow up appointment with care management team member scheduled for: 05-06-2021 at [place] am  Noreene Larsson RN, MSN, [place] Mobile: [phone]

## 2021-03-13 ENCOUNTER — Other Ambulatory Visit: Payer: Self-pay

## 2021-03-13 ENCOUNTER — Ambulatory Visit: Payer: Medicare HMO

## 2021-03-13 DIAGNOSIS — Z9889 Other specified postprocedural states: Secondary | ICD-10-CM

## 2021-03-13 DIAGNOSIS — I059 Rheumatic mitral valve disease, unspecified: Secondary | ICD-10-CM | POA: Diagnosis not present

## 2021-03-13 DIAGNOSIS — Z5181 Encounter for therapeutic drug level monitoring: Secondary | ICD-10-CM

## 2021-03-13 DIAGNOSIS — I4891 Unspecified atrial fibrillation: Secondary | ICD-10-CM | POA: Diagnosis not present

## 2021-03-13 DIAGNOSIS — I482 Chronic atrial fibrillation, unspecified: Secondary | ICD-10-CM

## 2021-03-13 LAB — POCT INR: INR: 4.5 — AB (ref 2.0–3.0)

## 2021-03-13 NOTE — Patient Instructions (Signed)
-   skip warfarin tonight, then - START NEW DOSAGE of warfarin of 1/2  tablet every day - Recheck INR in 1 week

## 2021-03-20 ENCOUNTER — Ambulatory Visit: Payer: Medicare HMO

## 2021-03-20 ENCOUNTER — Other Ambulatory Visit: Payer: Self-pay

## 2021-03-20 DIAGNOSIS — Z9889 Other specified postprocedural states: Secondary | ICD-10-CM

## 2021-03-20 DIAGNOSIS — I059 Rheumatic mitral valve disease, unspecified: Secondary | ICD-10-CM | POA: Diagnosis not present

## 2021-03-20 DIAGNOSIS — Z5181 Encounter for therapeutic drug level monitoring: Secondary | ICD-10-CM

## 2021-03-20 DIAGNOSIS — I4891 Unspecified atrial fibrillation: Secondary | ICD-10-CM

## 2021-03-20 DIAGNOSIS — I482 Chronic atrial fibrillation, unspecified: Secondary | ICD-10-CM | POA: Diagnosis not present

## 2021-03-20 LAB — POCT INR: INR: 2.6 (ref 2.0–3.0)

## 2021-03-20 NOTE — Patient Instructions (Signed)
-   START NEW DOSAGE of warfarin of 1/2  tablet every day EXCEPT whole tablet on [facility] AND SATURDAYS - Recheck INR in 1 week

## 2021-03-22 DIAGNOSIS — E7800 Pure hypercholesterolemia, unspecified: Secondary | ICD-10-CM | POA: Diagnosis not present

## 2021-03-22 DIAGNOSIS — I10 Essential (primary) hypertension: Secondary | ICD-10-CM

## 2021-03-22 DIAGNOSIS — I482 Chronic atrial fibrillation, unspecified: Secondary | ICD-10-CM

## 2021-03-27 ENCOUNTER — Ambulatory Visit: Payer: Medicare HMO

## 2021-03-27 ENCOUNTER — Other Ambulatory Visit: Payer: Self-pay

## 2021-03-27 DIAGNOSIS — Z9889 Other specified postprocedural states: Secondary | ICD-10-CM

## 2021-03-27 DIAGNOSIS — I059 Rheumatic mitral valve disease, unspecified: Secondary | ICD-10-CM

## 2021-03-27 DIAGNOSIS — Z5181 Encounter for therapeutic drug level monitoring: Secondary | ICD-10-CM

## 2021-03-27 DIAGNOSIS — I482 Chronic atrial fibrillation, unspecified: Secondary | ICD-10-CM | POA: Diagnosis not present

## 2021-03-27 DIAGNOSIS — I4891 Unspecified atrial fibrillation: Secondary | ICD-10-CM

## 2021-03-27 LAB — POCT INR: INR: 4.3 — AB (ref 2.0–3.0)

## 2021-03-27 NOTE — Patient Instructions (Signed)
-   skip warfarin tonight, then - START NEW DOSAGE of warfarin of 1/2  tablet every day - Recheck INR in 1 week

## 2021-03-30 ENCOUNTER — Other Ambulatory Visit: Payer: Self-pay | Admitting: Nurse Practitioner

## 2021-04-01 ENCOUNTER — Other Ambulatory Visit: Payer: Self-pay

## 2021-04-03 ENCOUNTER — Other Ambulatory Visit: Payer: Self-pay

## 2021-04-03 ENCOUNTER — Ambulatory Visit: Payer: Medicare HMO

## 2021-04-03 DIAGNOSIS — I059 Rheumatic mitral valve disease, unspecified: Secondary | ICD-10-CM

## 2021-04-03 DIAGNOSIS — I4891 Unspecified atrial fibrillation: Secondary | ICD-10-CM | POA: Diagnosis not present

## 2021-04-03 DIAGNOSIS — Z9889 Other specified postprocedural states: Secondary | ICD-10-CM

## 2021-04-03 DIAGNOSIS — Z5181 Encounter for therapeutic drug level monitoring: Secondary | ICD-10-CM

## 2021-04-03 DIAGNOSIS — I482 Chronic atrial fibrillation, unspecified: Secondary | ICD-10-CM | POA: Diagnosis not present

## 2021-04-03 LAB — POCT INR: INR: 1.9 — AB (ref 2.0–3.0)

## 2021-04-03 NOTE — Patient Instructions (Signed)
-   take 1 tablet tonight, then  - continue dosage of warfarin of 1/2  tablet every day - Recheck INR in 1 week

## 2021-04-10 ENCOUNTER — Other Ambulatory Visit: Payer: Self-pay

## 2021-04-10 ENCOUNTER — Ambulatory Visit: Payer: Medicare HMO

## 2021-04-10 DIAGNOSIS — I482 Chronic atrial fibrillation, unspecified: Secondary | ICD-10-CM | POA: Diagnosis not present

## 2021-04-10 DIAGNOSIS — I059 Rheumatic mitral valve disease, unspecified: Secondary | ICD-10-CM

## 2021-04-10 DIAGNOSIS — Z5181 Encounter for therapeutic drug level monitoring: Secondary | ICD-10-CM

## 2021-04-10 DIAGNOSIS — Z9889 Other specified postprocedural states: Secondary | ICD-10-CM | POA: Diagnosis not present

## 2021-04-10 DIAGNOSIS — I4891 Unspecified atrial fibrillation: Secondary | ICD-10-CM | POA: Diagnosis not present

## 2021-04-10 LAB — POCT INR: INR: 3.5 — AB (ref 2.0–3.0)

## 2021-04-10 NOTE — Patient Instructions (Signed)
-   continue dosage of warfarin of 1/2  tablet every day - Recheck INR in 1 week

## 2021-04-17 ENCOUNTER — Other Ambulatory Visit: Payer: Self-pay

## 2021-04-17 ENCOUNTER — Ambulatory Visit: Payer: Medicare HMO

## 2021-04-17 DIAGNOSIS — Z5181 Encounter for therapeutic drug level monitoring: Secondary | ICD-10-CM | POA: Diagnosis not present

## 2021-04-17 DIAGNOSIS — Z9889 Other specified postprocedural states: Secondary | ICD-10-CM

## 2021-04-17 DIAGNOSIS — I482 Chronic atrial fibrillation, unspecified: Secondary | ICD-10-CM | POA: Diagnosis not present

## 2021-04-17 DIAGNOSIS — I059 Rheumatic mitral valve disease, unspecified: Secondary | ICD-10-CM

## 2021-04-17 DIAGNOSIS — I4891 Unspecified atrial fibrillation: Secondary | ICD-10-CM | POA: Diagnosis not present

## 2021-04-17 LAB — POCT INR: INR: 2 (ref 2.0–3.0)

## 2021-04-17 NOTE — Patient Instructions (Signed)
-   take 1 whole tablet warfarin tonight, then - continue dosage of warfarin of 1/2  tablet every day - Recheck INR in 1 week

## 2021-04-18 ENCOUNTER — Ambulatory Visit (INDEPENDENT_AMBULATORY_CARE_PROVIDER_SITE_OTHER): Payer: Medicare HMO | Admitting: Endocrinology

## 2021-04-18 ENCOUNTER — Other Ambulatory Visit: Payer: Self-pay

## 2021-04-18 VITALS — BP 164/84 | HR 82 | Ht 64.0 in | Wt 169.8 lb

## 2021-04-18 DIAGNOSIS — R1310 Dysphagia, unspecified: Secondary | ICD-10-CM

## 2021-04-18 DIAGNOSIS — E042 Nontoxic multinodular goiter: Secondary | ICD-10-CM

## 2021-04-18 NOTE — Patient Instructions (Addendum)
Your blood pressure is high today.  Please see your primary care provider soon, to have it rechecked.  Let's recheck the ultrasound.  you will receive a phone call, about a day and time for an appointment. Please see a gastroenterology specialist.  you will receive a phone call, about a day and time for an appointment Please come back for a follow-up appointment in 1 year.

## 2021-04-18 NOTE — Progress Notes (Signed)
Subjective:    Patient ID: [name], female    DOB: 1956-06-11, 65 y.o.   MRN: [id number]  HPI Pt is referred by Dr Parks Ranger, for hyperthyroidism.  Pt reports he was dx'ed with hyperthyroidism in approx 2000.  she took an oral medication for this for a few years, but none since then.  she has never had XRT to the anterior neck, or thyroid surgery.   she does not consume kelp or any other prescribed or non-prescribed thyroid medication.  she has never been on amiodarone.  She has 1 month of intermitt dysphagia (solid=liquid) sensation in the neck, and regurgitation.   Past Medical History:  Diagnosis Date   Anticoagulated on warfarin    Arthritis    Heart murmur    History of cardiac catheterization    a. 2008 Cath: nl cors.   History of hyperthyroidism    a. 2003--  PTU [place]   Hypercholesterolemia    Hyperlipidemia    Multinodular goiter    Permanent atrial fibrillation ([place])    a. Dx 1997; b. CHA2DS2VASc = 3-->warfarin (s/p MVR as well); c. 06/2013 Holter: HRs freq in low 100's to 1-teens w/ max rate 179-->beta blocker increased.   Pulmonary hypertension, mild (HCC)    Rheumatic heart disease    S/P MVR (mitral valve replacement)    a. 1997 s/p SJM mechanical MVR 2/2 Sev MS; b. chronic warfarin; c. 10/2017 Echo: EF 55-60%, no rwma, triv AI. Nl fxn of MV prosthesis. Sev dil LA. Mildly to mod increased PA pressures.    Past Surgical History:  Procedure Laterality Date   ANTERIOR CERVICAL DECOMP/DISCECTOMY FUSION  12-21-2001   C5 --  C6   BREAST BIOPSY Left 11/03/2016   Procedure: BREAST BIOPSY WITH NEEDLE LOCALIZATION;  Surgeon: Robert Bellow, MD;  Location: [facility] ORS;  Service: General;  Laterality: Left;   BREAST EXCISIONAL BIOPSY Left 2018   CARDIAC CATHETERIZATION  12-28-2006  DR [facility]   NORMAL CORONARY ARTERIES   CARPAL TUNNEL RELEASE Right 01-20-2007   COLONOSCOPY  2015   ERCP  08/15/2011   Procedure: ENDOSCOPIC RETROGRADE CHOLANGIOPANCREATOGRAPHY (ERCP);   Surgeon: Jeryl Columbia, MD;  Location: [facility] ENDOSCOPY;  Service: Endoscopy;  Laterality: N/A;   ERCP     MULTIPLE--  INCLUDING STENTS, SPHINTEROTOMY'S  STONE REMOVAL    FEMUR IM NAIL Right 04/26/2014   Procedure: INTRAMEDULLARY (IM) NAIL FEMORAL;  Surgeon: Alta Corning, MD;  Location: [place];  Service: Orthopedics;  Laterality: Right;   I & D EXTREMITY Right 02/07/2013   Procedure: IRRIGATION AND DEBRIDEMENT OF RIGHT LOWER LEG WITH PLACEMENT OF ACELL AND WOULND VAC;  Surgeon: Theodoro Kos, DO;  Location: [place];  Service: Plastics;  Laterality: Right;   LAPAROSCOPIC CHOLECYSTECTOMY  04-08-2002   MITRAL VALVE REPLACEMENT  1997   [facility]   ORIF ANKLE FRACTURE Right 11/05/2012   Procedure: OPEN REDUCTION INTERNAL FIXATION (ORIF) ANKLE FRACTURE only operating on right;  Surgeon: Alta Corning, MD;  Location: [place];  Service: Orthopedics;  Laterality: Right;   ORIF RIGHT ULNAR FX W/ ILIAC CREST BONE GRAFT  10-06-2008   ORIF WRIST FRACTURE Right 04/26/2014   Procedure: OPEN REDUCTION INTERNAL FIXATION (ORIF) WRIST FRACTURE;  Surgeon: Alta Corning, MD;  Location: [place];  Service: Orthopedics;  Laterality: Right;   TRANSTHORACIC ECHOCARDIOGRAM  06-29-2009  dr wall   normal lvsf/ ef 55-60%/  normal bileaflet mechanical mvr/ moderated dilated la/ moderate tr   WRIST ARTHROSCOPY Right  12-10-2007   debridement and ulnar shortening osteotomy w/ plate    Social History   Socioeconomic History   Marital status: Divorced    Spouse name: Not on file   Number of children: 0   Years of education: 12   Highest education level: 12th grade  Occupational History   Occupation: Control and instrumentation engineer    Comment: retired  Tobacco Use   Smoking status: Never   Smokeless tobacco: Never  Scientific laboratory technician Use: Never used  Substance and Sexual Activity   Alcohol use: No   Drug use: No   Sexual activity: Not on file  Other Topics Concern   Not on file  Social History Narrative   Not on file    Social Determinants of Health   Financial Resource Strain: Low Risk    Difficulty of Paying Living Expenses: Not hard at all  Food Insecurity: No Food Insecurity   Worried About Charity fundraiser in the Last Year: Never true   [place] in the Last Year: Never true  Transportation Needs: No Transportation Needs   Lack of Transportation (Medical): No   Lack of Transportation (Non-Medical): No  Physical Activity: Sufficiently Active   Days of Exercise per Week: 5 days   Minutes of Exercise per Session: 50 min  Stress: No Stress Concern Present   Feeling of Stress : Not at all  Social Connections: Moderately Integrated   Frequency of Communication with Friends and Family: More than three times a week   Frequency of Social Gatherings with Friends and Family: More than three times a week   Attends Religious Services: More than 4 times per year   Active Member of [employer] or Organizations: Yes   Attends Music therapist: More than 4 times per year   Marital Status: Divorced  Human resources officer Violence: Not At Risk   Fear of Current or Ex-Partner: No   Emotionally Abused: No   Physically Abused: No   Sexually Abused: No    Current Outpatient Medications on File Prior to Visit  Medication Sig Dispense Refill   acyclovir (ZOVIRAX) 400 MG tablet TAKE 1 TABLET BY MOUTH 3 TIMES DAILY FORFIVE TO TEN DAYS OR UNTIL HEALED. FOR FEVER BLISTER. 30 tablet 1   alendronate (FOSAMAX) 35 MG tablet TAKE 1 TABLET BY MOUTH EVERY 7 DAYS. TAKE WITH A FULL GLASS OF WATER ON AN EMPTY STOMACH. 12 tablet 0   amitriptyline (ELAVIL) 50 MG tablet Take 50 mg by mouth at bedtime as needed for sleep.      aspirin EC 81 MG tablet Take 1 tablet (81 mg total) by mouth daily. 90 tablet 3   atorvastatin (LIPITOR) 40 MG tablet TAKE 1 TABLET BY MOUTH ONCE EVERY EVENING 90 tablet 3   calcium carbonate (OSCAL) 1500 (600 Ca) MG TABS tablet Take 1 tablet by mouth daily.      cetirizine (ZYRTEC) 10 MG tablet  TAKE 1 TABLET BY MOUTH ONCE A DAY 90 tablet 3   Cholecalciferol (VITAMIN D3) 2000 units capsule Take 1 capsule (2,000 Units total) by mouth daily.     dicyclomine (BENTYL) 10 MG capsule TAKE 1 CAPSULE BY MOUTH 4 TIMES A DAY BEFORE MEALS AND AT BEDTIME. 90 capsule 1   fluticasone (FLONASE) 50 MCG/ACT nasal spray PLACE 2 SPRAYS INTO BOTH NOSTRILS DAILY 16 g 11   furosemide (LASIX) 20 MG tablet TAKE 1 TABLET BY MOUTH EVERY OTHER DAY 45 tablet 3   lisinopril (ZESTRIL) 40  MG tablet Take 1 tablet (40 mg total) by mouth daily. 90 tablet 2   methocarbamol (ROBAXIN) 500 MG tablet Take 1 tablet (500 mg total) by mouth 4 (four) times daily as needed for muscle spasms (pain). 30 tablet 3   metoprolol succinate (TOPROL-XL) 100 MG 24 hr tablet TAKE 1.5 TABLETS BY MOUTH DAILY. TAKE WITH OR IMMEDIATELY FOLLOWING A MEAL 135 tablet 0   Multiple Vitamins-Minerals (CENTRUM SILVER ADULT 50+ PO) Take 1 tablet by mouth daily.     triamcinolone cream (KENALOG) 0.5 % Apply 1 application topically 2 (two) times daily. To affected areas, for up to 2 weeks. 30 g 0   warfarin (COUMADIN) 5 MG tablet TAKE 1 TABLET BY MOUTH ONCE A DAY OR AS DIRECTED BY COUMADIN CLINIC. 90 tablet 1   No current facility-administered medications on file prior to visit.    No Known Allergies  Family History  Problem Relation Age of Onset   Hypothyroidism Sister    Gallbladder disease Sister    Coronary artery disease Other    Diabetes Mother    Hypertension Mother    Heart disease Father    Stroke Father    Breast cancer Neg Hx     BP (!) 164/84 (BP Location: Right Arm, Patient Position: Sitting, Cuff Size: Normal)   Pulse 82   Ht 5\' 4"  (1.626 m)   Wt 169 lb 12.8 oz (77 kg)   SpO2 98%   BMI 29.15 kg/m     Review of Systems Denies hoarseness, neck pain, and sob.    Objective:   Physical Exam NECK: a healed scar is present (C-spine procedure).  possible left sided nodule, but I can't be sure.     Lab Results  Component  Value Date   TSH 0.40 11/28/2020   [place] (2018): 1.9 cm TI-RADS category 4 nodule (#6) in the left inferior meets criteria for consideration of fine-needle aspiration biopsy.   Nuc med scan (2000) 24 HOUR UPTAKE  IS 10.6%.  NORMAL RANGE 10 TO 35%.  THYROID GLAND SHOWS DIFFUSE HETEROGENEOUS TRACER ACCUMULATION WITH MARKED DIMINISHED ACTIVITY  WITHIN THE RIGHT LOBE WITH FOCAL AREAS OF NORMAL ACTIVITY.  THE LEFT LOBE IS DIFFUSELY  HETEROGENEOUS.  NO DISCERNIBLE LESIONS ARE SEEN. THE FEATURES LIKELY REFLECT MANIFESTATION OF A  MULTINODULAR GOITER.   I have reviewed outside records, and summarized: Pt was noted to have MNG, and referred here.  I last saw pt in 2018.  She was noted to have intermitt hyperthyroidism     Assessment & Plan:  MNG, new to me, with borderline hyperthyroidism Dysphagia, new to me, uncertain etiology and prognosis.   Patient Instructions  Your blood pressure is high today.  Please see your primary care provider soon, to have it rechecked.  Let's recheck the ultrasound.  you will receive a phone call, about a day and time for an appointment. Please see a gastroenterology specialist.  you will receive a phone call, about a day and time for an appointment Please come back for a follow-up appointment in 1 year.

## 2021-04-24 ENCOUNTER — Other Ambulatory Visit: Payer: Self-pay

## 2021-04-24 ENCOUNTER — Ambulatory Visit: Payer: Medicare HMO

## 2021-04-24 DIAGNOSIS — Z5181 Encounter for therapeutic drug level monitoring: Secondary | ICD-10-CM | POA: Diagnosis not present

## 2021-04-24 DIAGNOSIS — I059 Rheumatic mitral valve disease, unspecified: Secondary | ICD-10-CM | POA: Diagnosis not present

## 2021-04-24 DIAGNOSIS — Z9889 Other specified postprocedural states: Secondary | ICD-10-CM | POA: Diagnosis not present

## 2021-04-24 DIAGNOSIS — I482 Chronic atrial fibrillation, unspecified: Secondary | ICD-10-CM

## 2021-04-24 DIAGNOSIS — I4891 Unspecified atrial fibrillation: Secondary | ICD-10-CM | POA: Diagnosis not present

## 2021-04-24 LAB — POCT INR: INR: 2.2 (ref 2.0–3.0)

## 2021-04-24 NOTE — Patient Instructions (Signed)
-   START NEW DOSAGE of warfarin of 1/2  tablet every day EXCEPT 1 tablet on [facility] AND SATURDAYS - Recheck INR in 1 week

## 2021-05-01 ENCOUNTER — Ambulatory Visit (INDEPENDENT_AMBULATORY_CARE_PROVIDER_SITE_OTHER): Payer: Medicare HMO

## 2021-05-01 ENCOUNTER — Other Ambulatory Visit: Payer: Self-pay

## 2021-05-01 DIAGNOSIS — Z9889 Other specified postprocedural states: Secondary | ICD-10-CM | POA: Diagnosis not present

## 2021-05-01 DIAGNOSIS — I482 Chronic atrial fibrillation, unspecified: Secondary | ICD-10-CM

## 2021-05-01 DIAGNOSIS — I4891 Unspecified atrial fibrillation: Secondary | ICD-10-CM | POA: Diagnosis not present

## 2021-05-01 DIAGNOSIS — Z5181 Encounter for therapeutic drug level monitoring: Secondary | ICD-10-CM | POA: Diagnosis not present

## 2021-05-01 DIAGNOSIS — I059 Rheumatic mitral valve disease, unspecified: Secondary | ICD-10-CM

## 2021-05-01 LAB — POCT INR: INR: 1.8 — AB (ref 2.0–3.0)

## 2021-05-01 NOTE — Patient Instructions (Signed)
-   START NEW DOSAGE of warfarin of 1/2  tablet every day EXCEPT 1 tablet on MONDAYS, [place]. - Recheck INR in 1 week.

## 2021-05-06 ENCOUNTER — Telehealth: Payer: Medicare HMO | Admitting: General Practice

## 2021-05-06 ENCOUNTER — Ambulatory Visit (INDEPENDENT_AMBULATORY_CARE_PROVIDER_SITE_OTHER): Payer: Medicare HMO

## 2021-05-06 DIAGNOSIS — R1310 Dysphagia, unspecified: Secondary | ICD-10-CM

## 2021-05-06 DIAGNOSIS — E7800 Pure hypercholesterolemia, unspecified: Secondary | ICD-10-CM

## 2021-05-06 DIAGNOSIS — I482 Chronic atrial fibrillation, unspecified: Secondary | ICD-10-CM

## 2021-05-06 DIAGNOSIS — I10 Essential (primary) hypertension: Secondary | ICD-10-CM

## 2021-05-06 NOTE — Patient Instructions (Addendum)
Visit Information  Patient will self administer medications as prescribed as evidenced by self report/primary caregiver report  Patient will attend all scheduled provider appointments as evidenced by clinician review of documented attendance to scheduled appointments and patient/caregiver report Patient will call pharmacy for medication refills as evidenced by patient report and review of pharmacy fill history as appropriate Patient will attend church or other social activities as evidenced by patient report Patient will continue to perform ADL's independently as evidenced by patient/caregiver report Patient will continue to perform IADL's independently as evidenced by patient/caregiver report Patient will call provider office for new concerns or questions as evidenced by review of documented incoming telephone call notes and patient report Patient will work with BSW to address care coordination needs and will continue to work with the clinical team to address health care and disease management related needs as evidenced by documented adherence to scheduled care management/care coordination appointments - check pulse (heart) rate before taking medicine - make a plan to exercise regularly - make a plan to eat healthy - keep all lab appointments - take medicine as prescribed - check blood pressure 3 times per week - choose a place to take my blood pressure (home, clinic or office, retail store) - write blood pressure results in a log or diary - learn about high blood pressure - keep a blood pressure log - take blood pressure log to all doctor appointments - call doctor for signs and symptoms of high blood pressure - develop an action plan for high blood pressure - keep all doctor appointments - take medications for blood pressure exactly as prescribed - begin an exercise program - report new symptoms to your doctor - eat more whole grains, fruits and vegetables, lean meats and healthy  fats - call for medicine refill 2 or 3 days before it runs out - take all medications exactly as prescribed - call doctor with any symptoms you believe are related to your medicine - call doctor when you experience any new symptoms - go to all doctor appointments as scheduled - adhere to prescribed diet: Heart Healthy diet   Patient verbalizes understanding of instructions provided today and agrees to view in [place].   Telephone follow up appointment with care management team member scheduled for: 07-08-2021 at 0900 am  Noreene Larsson RN, MSN, [place] Mobile: [phone]

## 2021-05-06 NOTE — Chronic Care Management (AMB) (Signed)
Chronic Care Management   CCM RN Visit Note  05/06/2021 Name: [name] MRN: [id number] DOB: 04-09-1956  Subjective: [name] is a 65 y.o. year old female who is a primary care patient of Olin Hauser, DO. The care management team was consulted for assistance with disease management and care coordination needs.    Engaged with patient by telephone for follow up visit in response to provider referral for case management and/or care coordination services.   Consent to Services:  The patient was given information about Chronic Care Management services, agreed to services, and gave verbal consent prior to initiation of services.  Please see initial visit note for detailed documentation.   Patient agreed to services and verbal consent obtained.   Assessment: Review of patient past medical history, allergies, medications, health status, including review of consultants reports, laboratory and other test data, was performed as part of comprehensive evaluation and provision of chronic care management services.   SDOH (Social Determinants of Health) assessments and interventions performed:    CCM Care Plan  No Known Allergies  Outpatient Encounter Medications as of 05/06/2021  Medication Sig   acyclovir (ZOVIRAX) 400 MG tablet TAKE 1 TABLET BY MOUTH 3 TIMES DAILY FORFIVE TO TEN DAYS OR UNTIL HEALED. FOR FEVER BLISTER.   alendronate (FOSAMAX) 35 MG tablet TAKE 1 TABLET BY MOUTH EVERY 7 DAYS. TAKE WITH A FULL GLASS OF WATER ON AN EMPTY STOMACH.   amitriptyline (ELAVIL) 50 MG tablet Take 50 mg by mouth at bedtime as needed for sleep.    aspirin EC 81 MG tablet Take 1 tablet (81 mg total) by mouth daily.   atorvastatin (LIPITOR) 40 MG tablet TAKE 1 TABLET BY MOUTH ONCE EVERY EVENING   calcium carbonate (OSCAL) 1500 (600 Ca) MG TABS tablet Take 1 tablet by mouth daily.    cetirizine (ZYRTEC) 10 MG tablet TAKE 1 TABLET BY MOUTH ONCE A DAY   Cholecalciferol (VITAMIN D3)  2000 units capsule Take 1 capsule (2,000 Units total) by mouth daily.   dicyclomine (BENTYL) 10 MG capsule TAKE 1 CAPSULE BY MOUTH 4 TIMES A DAY BEFORE MEALS AND AT BEDTIME.   fluticasone (FLONASE) 50 MCG/ACT nasal spray PLACE 2 SPRAYS INTO BOTH NOSTRILS DAILY   furosemide (LASIX) 20 MG tablet TAKE 1 TABLET BY MOUTH EVERY OTHER DAY   lisinopril (ZESTRIL) 40 MG tablet Take 1 tablet (40 mg total) by mouth daily.   methocarbamol (ROBAXIN) 500 MG tablet Take 1 tablet (500 mg total) by mouth 4 (four) times daily as needed for muscle spasms (pain).   metoprolol succinate (TOPROL-XL) 100 MG 24 hr tablet TAKE 1.5 TABLETS BY MOUTH DAILY. TAKE WITH OR IMMEDIATELY FOLLOWING A MEAL   Multiple Vitamins-Minerals (CENTRUM SILVER ADULT 50+ PO) Take 1 tablet by mouth daily.   triamcinolone cream (KENALOG) 0.5 % Apply 1 application topically 2 (two) times daily. To affected areas, for up to 2 weeks.   warfarin (COUMADIN) 5 MG tablet TAKE 1 TABLET BY MOUTH ONCE A DAY OR AS DIRECTED BY COUMADIN CLINIC.   No facility-administered encounter medications on file as of 05/06/2021.    Patient Active Problem List   Diagnosis Date Noted   Dysphagia 04/18/2021   Obesity (BMI 30.0-34.9) 06/08/2018   Elevated hemoglobin A1c 06/18/2017   Allergic rhinitis 12/02/2016   Vitamin D deficiency 12/02/2016   S/P MVR (mitral valve replacement) 06/03/2016   Osteopenia 06/03/2016   Environmental and seasonal allergies 06/03/2016   History of epigastric pain 03/13/2016  Breast microcalcifications 10/15/2015   Piriformis syndrome 10/02/2015   History of femur fracture 04/26/2014   Encounter for therapeutic drug monitoring 07/19/2013   Choledocholithiasis 08/14/2011   Anticoagulated on Coumadin 08/14/2011   Mitral valve disease 08/15/2010   MITRAL VALVE REPLACEMENT, HX OF 04/24/2009   History of hyperthyroidism 04/19/2009   Essential hypertension 10/17/2008   Pulmonary hypertension ([place]) 10/17/2008   RHEUMATIC HEART  DISEASE, HX OF 10/17/2008   GOITER, MULTINODULAR 10/12/2007   HYPERCHOLESTEROLEMIA 10/12/2007   Chronic atrial fibrillation ([place]) 10/12/2007   GERD 10/12/2007    Conditions to be addressed/monitored:Atrial Fibrillation, CAD, HLD, and dysphagia  Care Plan : RNCM: Atrial Fibrillation (Adult)  Updates made by [name], RN since 05/06/2021 12:00 AM  Completed 05/06/2021   Problem: RNCM: Dysrhythmia (Atrial Fibrillation) Resolved 05/06/2021  Priority: Medium     Goal: RNCM: Heart Rate and Rhythm Monitored and Managed Completed 05/06/2021  Start Date: 08/06/2020  Expected End Date: 12/10/2021  Recent Progress: On track  Priority: Medium  Note:   Current Barriers: Resolving, duplicate goal Chronic Disease Management support and education needs related to effective management of Afib  Lacks caregiver support.  Lacks social connections Does not contact provider office for questions/concerns  Nurse Case Manager Clinical Goal(s):   patient will verbalize understanding of plan for effective management of AFIB patient will work with [facility] and pcp  to address needs related to AFIB patient will attend all scheduled medical appointments: 06-06-2021 at 0900 am  Interventions:  1:1 collaboration with Olin Hauser, DO regarding development and update of comprehensive plan of care as evidenced by provider attestation and co-signature Inter-disciplinary care team collaboration (see longitudinal plan of care) Evaluation of current treatment plan related to Afib and patient's adherence to plan as established by provider. 10-01-2020: The patient is doing well. She is having some issues keeping her INR regulated and is having several blood test. She states she has always been on a roller coaster with her blood work. She goes back this Wednesday for a new check. Works with cardiologist for regulation of medications. 12-10-2020: Patient continues to be well managed.  Patient stated that one  medication, Metoprolol Succinate (Toprol-XL) was increased from 100 mg 1 tab to 150 mg (1.5 tabs) daily on 11/22/2020 following office visit with cardiologist.  Per chart review: On June 1, she had to brief and somewhat fleeting episodes of sharp chest pain occurring over the lower left rib cage at approximately the left mid axillary line, lasting about 2 minutes, and resolving spontaneously.  She said pain was very sharp and worse with palpation.  She mentioned symptoms at anticoagulation clinic and was seen by cardiologist on 11/22/2020.  Patient denies any chest pain, palpitations, dizziness, syncope, or edema today.  Patient compliant with medications.  Continues to attend Coumadin Clinic as scheduled. 03-11-2021: The patient has been having to have labwork frequently for erratic INRs. The patient is working with cardiology and she states that her blood work has always been with fluctuations. She denies any acute distress.  Advised patient to call the office for changes or questions  Provided education to patient re: changes in Afib, risk for heart attack and stroke  Reviewed scheduled/upcoming provider appointments including: 06-06-2021 at 9:00 am for annual physical.  Coumadin Clinic on a regular basis.  Discussed plans with patient for ongoing care management follow up and provided patient with direct contact information for care management team  Patient Goals/Self-Care Activities  patient will:  - Patient will self administer  medications as prescribed Patient will attend all scheduled provider appointments Patient will call pharmacy for medication refills Patient will attend church or other social activities Patient will continue to perform ADL's independently Patient will continue to perform IADL's independently Patient will call provider office for new concerns or questions Patient will work with BSW to address care coordination needs and will continue to work with the clinical team to address  health care and disease management related needs.   - activity or exercise based on tolerance encouraged - barriers to lifestyle changes reviewed and addressed - medication-adherence assessment completed - medication side effects monitored and managed - reassurance provided - rescue (action) plan developed - response to pharmacologic therapy monitored - screen for functional limitations reviewed - rescue (action) plan use encouraged  Follow Up Plan: Telephone follow up appointment with care management team member scheduled for: 05-06-2021 at 0940 am        Care Plan : RNCM: Hypertension (Adult)  Updates made by [name], RN since 05/06/2021 12:00 AM  Completed 05/06/2021   Problem: RNCM: Hypertension (Hypertension) Resolved 05/06/2021  Priority: Medium     Long-Range Goal: RNCM: Hypertension Monitored Completed 05/06/2021  Start Date: 08/06/2020  Expected End Date: 12/20/2020  Recent Progress: On track  Priority: Medium  Note:   Objective: Resolving, duplicate goal  Last practice recorded BP readings:  BP Readings from Last 3 Encounters:  02/14/21 136/78  12/05/20 140/80  11/22/20 140/90     Most recent eGFR/CrCl: No results found for: EGFR  No components found for: CRCL Current Barriers:  Knowledge Deficits related to basic understanding of hypertension pathophysiology and self care management Limited Social Support Lacks social connections Does not contact provider office for questions/concerns Case Manager Clinical Goal(s):  patient will verbalize understanding of plan for hypertension management  patient will attend all scheduled medical appointments: 06-06-2021  patient will demonstrate improved adherence to prescribed treatment plan for hypertension as evidenced by taking all medications as prescribed, monitoring and recording blood pressure as directed, adhering to low sodium/DASH diet  patient will demonstrate improved health management independence as  evidenced by checking blood pressure as directed and notifying PCP if SBP>160 or DBP > 90, taking all medications as prescribe, and adhering to a low sodium diet as discussed.  patient will verbalize basic understanding of hypertension disease process and self health management plan as evidenced by heart healthy diet, medications compliance, working with CCM team to optimize health and well being.  Interventions:  Collaboration with Olin Hauser, DO regarding development and update of comprehensive plan of care as evidenced by provider attestation and co-signature Inter-disciplinary care team collaboration (see longitudinal plan of care) Evaluation of current treatment plan related to hypertension self management and patient's adherence to plan as established by provider. 10-01-2020: The patient currently has good control of her HTN. Monitors he food intake and actually does not like to eat but limits sodium and fried foods. She is happy about the temperature today being in the 80's so she can get out. Denies any new concerns. 12-10-2020: Patient monitoring her blood pressure at home with range 140/60-75. Denies any high or low blood pressure readings.  Denies any lower extremity swelling, no dizziness or light-headedness.  Encouraged her to continue to monitor blood pressure especially because of recent medication dosage adjustment of Toprol-XL from 100 mg to 150 mg daily.03-11-2021: The patient is doing well with the management of her blood pressures. Denies any acute findings. States that she is continuing to  monitor at home. Denies any edema bilateral lower extremities. Will continue to monitor.  Provided education to patient re: stroke prevention, s/s of heart attack and stroke, DASH diet, complications of uncontrolled blood pressure. 12-10-2020: reviewed the importance of a low sodium diet.  Patient talked about eating tomato sandwiches with salt on them only during the summertime.  Encouraged  her to moderate her salt intake.  Patient states she does not eat foods with added salt except for the summertime tomato sandwich. 03-11-2021: Review of heart healthy diet and monitoring for hidden sodium in foods. Will continue to monitor.  Reviewed medications with patient and discussed importance of compliance. 10-01-2020: The patient is compliant with her medications. 12-12-2020: On 11-22-2020, Toprol-XL was increased from 100 mg to 150 mg daily due to episode of chest pain on 11-21-2020.  Patient compliant with all medications.  No further issues or complaints.03-11-2021: Compliant with medications. Will continue to monitor.  Discussed plans with patient for ongoing care management follow up and provided patient with direct contact information for care management team Advised patient, providing education and rationale, to monitor blood pressure daily and record, calling PCP for findings outside established parameters.  Reviewed scheduled/upcoming provider appointments including:  06-06-2021 at 9:00 am Patient Goals/Self-Care Activities patient will:  - Self administers medications as prescribed Attends all scheduled provider appointments Calls provider office for new concerns, questions, or BP outside discussed parameters Checks BP and records as discussed Follows a low sodium diet/DASH diet- is compliant with heart healthy diet - blood pressure trends reviewed - depression screen reviewed - home or ambulatory blood pressure monitoring encouraged Follow Up Plan: Telephone follow up appointment with care management team member scheduled for: 05-06-2021 at 0945 am    Care Plan : RNCM: HLD Management  Updates made by [name], RN since 05/06/2021 12:00 AM  Completed 05/06/2021   Problem: RNCM: HLD Management Resolved 05/06/2021  Priority: Medium     Long-Range Goal: RNCM: HLD Management Completed 05/06/2021  Start Date: 08/06/2020  Expected End Date: 08/17/2021  Recent Progress: On track   Priority: Medium  Note:   Current Barriers: Resolving, Duplicate goal  Poorly controlled hyperlipidemia, complicated by Afib, not always following a heart healthy diet  Current antihyperlipidemic regimen: Lipitor 40 mg QD Most recent lipid panel:  Lab Results  Component Value Date   CHOL 134 05/25/2020   CHOL 126 05/31/2019   CHOL 170 06/01/2018   Lab Results  Component Value Date   HDL 45 (L) 05/25/2020   HDL 48 (L) 05/31/2019   HDL 50 (L) 06/01/2018    Lab Results  Component Value Date   TRIG 103 05/25/2020   TRIG 85 05/31/2019   TRIG 129 06/01/2018      ASCVD risk enhancing conditions: age >23, HTN, AFIB Lacks social connections Does not contact provider office for questions/concerns  RN Care Manager Clinical Goal(s):  patient will work with Consulting civil engineer, providers, and care team towards execution of optimized self-health management plan  patient will verbalize understanding of plan for effective management of HLD patient will work with [facility] and pcp to address needs related to changes or question about HLD  patient will attend all scheduled medical appointments: 12-05-2020  Interventions: Collaboration with Olin Hauser, DO regarding development and update of comprehensive plan of care as evidenced by provider attestation and co-signature Inter-disciplinary care team collaboration (see longitudinal plan of care) Medication review performed; medication list updated in electronic medical record.  Inter-disciplinary care team collaboration (see  longitudinal plan of care) Referred to pharmacy team for assistance with HLD medication management Evaluation of current treatment plan related to HLD  and patient's adherence to plan as established by provider. 10-01-2020: The patient is compliant with plan of care for HLD.  03-11-2021:  Patient continues to be compliant with HLD plan of care. Advised patient to call the office for changes in condition or  questions Provided education to patient re: heart healthy diet, the patient does not like to eat, does stay hydrated with water  Reviewed scheduled/upcoming provider appointments including: 06-06-2021 at 9:00 am Discussed plans with patient for ongoing care management follow up and provided patient with direct contact information for care management team   Patient Goals/Self-Care Activities:  patient will:   - call for medicine refill 2 or 3 days before it runs out - call if I am sick and can't take my medicine - keep a list of all the medicines I take; vitamins and herbals too - learn to read medicine labels - use a pillbox to sort medicine - use an alarm clock or phone to remind me to take my medicine - change to whole grain breads, cereal, pasta - drink 6 to 8 glasses of water each day - eat 5 or 6 small meals each day - fill half the plate with nonstarchy vegetables - limit fast food meals to no more than 1 per week - manage portion size - prepare main meal at home 3 to 5 days each week - read food labels for fat, fiber, carbohydrates and portion size - be open to making changes - I can manage, know and watch for signs of a heart attack - if I have chest pain, call for help - learn about small changes that will make a big difference - learn my personal risk factors  - administration or use of medication demonstrated - barriers to medication adherence identified - medication list compiled - medication list reviewed - medication-adherence assessment completed - medication side effects managed - understanding of current medications assessed Follow Up Plan: Telephone follow up appointment with care management team member scheduled for: 05-06-2021 at 9:45 am      Care Plan : RNCM: General Plan of Care (Adult) for Chronic Disease Management and Care Coordination Needs  Updates made by [name], RN since 05/06/2021 12:00 AM     Problem: RNCM: Development of Plan of care  for Chronic Disease Management (AFIB, HTN, HLD, Dysphagia)   Priority: High     Long-Range Goal: RNCM: Effective Management of Plan of care for Chronic Disease Management (AFIB, HTN, HLD, Dysphagia)   Start Date: 05/06/2021  Expected End Date: 05/06/2022  Priority: High  Note:   Current Barriers:  Knowledge Deficits related to plan of care for management of Atrial Fibrillation, HTN, HLD, and dysphagia  Chronic Disease Management support and education needs related to Atrial Fibrillation, HTN, HLD, and dysphagia  RNCM Clinical Goal(s):  Patient will verbalize understanding of plan for management of Atrial Fibrillation, HTN, HLD, and dysphagia as evidenced by following the plan of care, taking medications as directed, keeping follow up appointments and calling the office for changes in conditions  take all medications exactly as prescribed and will call provider for medication related questions as evidenced by compliance with medications    attend all scheduled medical appointments: 06-06-2021 at 0900 am with pcp, waiting for referral for dysphagia appointment as evidenced by keeping appointments and calling for rescheduling needs  demonstrate improved and ongoing adherence to prescribed treatment plan for Atrial Fibrillation, HTN, HLD, and dysphagia as evidenced by seeing the providers as needed, and going to appointments coming up demonstrate a decrease in Atrial Fibrillation, HTN, HLD, and dsyphagia exacerbations  as evidenced by working with the CCM team to effectively manage health and well being demonstrate ongoing self health care management ability for effective management of chronic conditions as evidenced by  working with the CCM team through collaboration with Consulting civil engineer, provider, and care team.   Interventions: 1:1 collaboration with primary care provider regarding development and update of comprehensive plan of care as evidenced by provider attestation and  co-signature Inter-disciplinary care team collaboration (see longitudinal plan of care) Evaluation of current treatment plan related to  self management and patient's adherence to plan as established by provider   SDOH Barriers (Status: Goal on Track (progressing): YES.) Long Term Goal  Patient interviewed and SDOH assessment performed        Patient interviewed and appropriate assessments performed Provided patient with information about resources available and care guides to assist with changes in SDOH or new needs  Discussed plans with patient for ongoing care management follow up and provided patient with direct contact information for care management team Advised patient to call the office for changes in SDOH, new needs or concerns    A-fib:  (Status: Goal on Track (progressing): YES.) Long Term Goal  Counseled on increased risk of stroke due to Afib and benefits of anticoagulation for stroke prevention           Reviewed importance of adherence to anticoagulant exactly as prescribed Advised patient to discuss fluctuations in INR and effective AFIB management with provider Counseled on bleeding risk associated with AFIB and importance of self-monitoring for signs/symptoms of bleeding Counseled on avoidance of NSAIDs due to increased bleeding risk with anticoagulants Counseled on importance of regular laboratory monitoring as prescribed Counseled on seeking medical attention after a head injury or if there is blood in the urine/stool Afib action plan reviewed Screening for signs and symptoms of depression related to chronic disease state Assessed social determinant of health barriers Evaluation of laboratory monitoring of PT/INR. The patient has been having to have weekly checks due to variations in readings. Her INR has been as low as 1.1 and as high as 9.7. She states they are having a hard time regulating her INR.  Dysphagi  (Status: Goal on Track (progressing): YES.) Long Term  Goal  Evaluation of current treatment plan related to  Dysphagia ,  self-management and patient's adherence to plan as established by provider. Discussed plans with patient for ongoing care management follow up and provided patient with direct contact information for care management team Advised patient to call the provider office if she has not heard from the referral for GI. Saw Endocrinoligist on 04-18-2021 and referral placed to have further evaluation of dysphagia. The patient has not heard from the provider. Will call the office for follow up on referral placed; Provided education to patient re: precautions when eating. The patient states she has noticed its better but sometimes she gets choked with drinking water; Reviewed medications with patient and discussed compliance; Reviewed scheduled/upcoming provider appointments including 06-06-2021 at 0900 am and is waiting for referral to see specialist about dysphagia; Review of safety precautions   Hyperlipidemia:  (Status: Goal on Track (progressing): YES.) Long Term Goal  Lab Results  Component Value Date   CHOL 134 05/25/2020   HDL 45 (  L) 05/25/2020   LDLCALC 70 05/25/2020   LDLDIRECT 192.5 11/04/2010   TRIG 103 05/25/2020   CHOLHDL 3.0 05/25/2020     Medication review performed; medication list updated in electronic medical record.  Provider established cholesterol goals reviewed; Counseled on importance of regular laboratory monitoring as prescribed; Provided HLD educational materials; Reviewed role and benefits of statin for ASCVD risk reduction; Discussed strategies to manage statin-induced myalgias; Reviewed importance of limiting foods high in cholesterol;  Hypertension: (Status: Goal on Track (progressing): YES.) Last practice recorded BP readings:  BP Readings from Last 3 Encounters:  04/18/21 (!) 164/84  02/14/21 136/78  12/05/20 140/80  Most recent eGFR/CrCl:  Lab Results  Component Value Date   EGFR 62  11/22/2020    No components found for: CRCL  Evaluation of current treatment plan related to hypertension self management and patient's adherence to plan as established by provider;   Provided education to patient re: stroke prevention, s/s of heart attack and stroke; Reviewed prescribed diet heart healthy diet  Reviewed medications with patient and discussed importance of compliance;  Discussed plans with patient for ongoing care management follow up and provided patient with direct contact information for care management team; Advised patient, providing education and rationale, to monitor blood pressure daily and record, calling PCP for findings outside established parameters;  Advised patient to discuss blood pressure trends with provider. The patient states her blood pressure was elevated at endocrinologist office but it is better now. She has been taking at home and it is normal. States she was nervous because she had not seen the endocrinologist in a couple of years.  Provided education on prescribed diet heart healthy ;  Discussed complications of poorly controlled blood pressure such as heart disease, stroke, circulatory complications, vision complications, kidney impairment, sexual dysfunction;   Patient Goals/Self-Care Activities: Patient will self administer medications as prescribed as evidenced by self report/primary caregiver report  Patient will attend all scheduled provider appointments as evidenced by clinician review of documented attendance to scheduled appointments and patient/caregiver report Patient will call pharmacy for medication refills as evidenced by patient report and review of pharmacy fill history as appropriate Patient will attend church or other social activities as evidenced by patient report Patient will continue to perform ADL's independently as evidenced by patient/caregiver report Patient will continue to perform IADL's independently as evidenced by  patient/caregiver report Patient will call provider office for new concerns or questions as evidenced by review of documented incoming telephone call notes and patient report Patient will work with BSW to address care coordination needs and will continue to work with the clinical team to address health care and disease management related needs as evidenced by documented adherence to scheduled care management/care coordination appointments - check pulse (heart) rate before taking medicine - make a plan to exercise regularly - make a plan to eat healthy - keep all lab appointments - take medicine as prescribed - check blood pressure 3 times per week - choose a place to take my blood pressure (home, clinic or office, retail store) - write blood pressure results in a log or diary - learn about high blood pressure - keep a blood pressure log - take blood pressure log to all doctor appointments - call doctor for signs and symptoms of high blood pressure - develop an action plan for high blood pressure - keep all doctor appointments - take medications for blood pressure exactly as prescribed - begin an exercise program - report new symptoms to  your doctor - eat more whole grains, fruits and vegetables, lean meats and healthy fats - call for medicine refill 2 or 3 days before it runs out - take all medications exactly as prescribed - call doctor with any symptoms you believe are related to your medicine - call doctor when you experience any new symptoms - go to all doctor appointments as scheduled - adhere to prescribed diet: Heart Healthy diet        Plan:Telephone follow up appointment with care management team member scheduled for:  07-08-2021 at 0900 am  Noreene Larsson RN, MSN, [place] Mobile: [phone]

## 2021-05-08 ENCOUNTER — Other Ambulatory Visit: Payer: Self-pay

## 2021-05-08 ENCOUNTER — Ambulatory Visit: Payer: Medicare HMO

## 2021-05-08 DIAGNOSIS — I4891 Unspecified atrial fibrillation: Secondary | ICD-10-CM | POA: Diagnosis not present

## 2021-05-08 DIAGNOSIS — I482 Chronic atrial fibrillation, unspecified: Secondary | ICD-10-CM

## 2021-05-08 DIAGNOSIS — Z5181 Encounter for therapeutic drug level monitoring: Secondary | ICD-10-CM | POA: Diagnosis not present

## 2021-05-08 DIAGNOSIS — Z9889 Other specified postprocedural states: Secondary | ICD-10-CM | POA: Diagnosis not present

## 2021-05-08 DIAGNOSIS — I059 Rheumatic mitral valve disease, unspecified: Secondary | ICD-10-CM

## 2021-05-08 LAB — POCT INR: INR: 3.2 — AB (ref 2.0–3.0)

## 2021-05-08 NOTE — Patient Instructions (Signed)
-   continue dosage of warfarin of 1/2  tablet every day EXCEPT 1 tablet on MONDAYS, [name]. - Recheck INR in 1 week.

## 2021-05-15 ENCOUNTER — Other Ambulatory Visit: Payer: Self-pay

## 2021-05-15 ENCOUNTER — Ambulatory Visit: Payer: Medicare HMO

## 2021-05-15 DIAGNOSIS — I482 Chronic atrial fibrillation, unspecified: Secondary | ICD-10-CM

## 2021-05-15 DIAGNOSIS — I059 Rheumatic mitral valve disease, unspecified: Secondary | ICD-10-CM

## 2021-05-15 DIAGNOSIS — Z9889 Other specified postprocedural states: Secondary | ICD-10-CM | POA: Diagnosis not present

## 2021-05-15 DIAGNOSIS — I4891 Unspecified atrial fibrillation: Secondary | ICD-10-CM

## 2021-05-15 DIAGNOSIS — Z5181 Encounter for therapeutic drug level monitoring: Secondary | ICD-10-CM

## 2021-05-15 LAB — POCT INR: INR: 4.9 — AB (ref 2.0–3.0)

## 2021-05-15 NOTE — Patient Instructions (Signed)
-   skip warfarin tonight, have some greens today or tomorrow, then - continue dosage of warfarin of 1/2  tablet every day EXCEPT 1 tablet on MONDAYS, [place]. - Recheck INR in 1 week.

## 2021-05-22 ENCOUNTER — Ambulatory Visit: Payer: Medicare HMO

## 2021-05-22 ENCOUNTER — Other Ambulatory Visit: Payer: Self-pay

## 2021-05-22 DIAGNOSIS — Z5181 Encounter for therapeutic drug level monitoring: Secondary | ICD-10-CM

## 2021-05-22 DIAGNOSIS — I059 Rheumatic mitral valve disease, unspecified: Secondary | ICD-10-CM

## 2021-05-22 DIAGNOSIS — I482 Chronic atrial fibrillation, unspecified: Secondary | ICD-10-CM

## 2021-05-22 DIAGNOSIS — R1310 Dysphagia, unspecified: Secondary | ICD-10-CM | POA: Diagnosis not present

## 2021-05-22 DIAGNOSIS — I10 Essential (primary) hypertension: Secondary | ICD-10-CM

## 2021-05-22 DIAGNOSIS — Z9889 Other specified postprocedural states: Secondary | ICD-10-CM

## 2021-05-22 DIAGNOSIS — I4891 Unspecified atrial fibrillation: Secondary | ICD-10-CM

## 2021-05-22 DIAGNOSIS — E7800 Pure hypercholesterolemia, unspecified: Secondary | ICD-10-CM

## 2021-05-22 LAB — POCT INR: INR: 2.6 (ref 2.0–3.0)

## 2021-05-29 ENCOUNTER — Other Ambulatory Visit: Payer: Self-pay

## 2021-05-29 DIAGNOSIS — E7800 Pure hypercholesterolemia, unspecified: Secondary | ICD-10-CM

## 2021-05-29 DIAGNOSIS — E042 Nontoxic multinodular goiter: Secondary | ICD-10-CM

## 2021-05-29 DIAGNOSIS — I272 Pulmonary hypertension, unspecified: Secondary | ICD-10-CM

## 2021-05-29 DIAGNOSIS — I482 Chronic atrial fibrillation, unspecified: Secondary | ICD-10-CM

## 2021-05-29 DIAGNOSIS — R7309 Other abnormal glucose: Secondary | ICD-10-CM

## 2021-05-29 DIAGNOSIS — Z0000 Encounter for general adult medical examination without abnormal findings: Secondary | ICD-10-CM

## 2021-05-29 DIAGNOSIS — I10 Essential (primary) hypertension: Secondary | ICD-10-CM

## 2021-05-30 ENCOUNTER — Other Ambulatory Visit: Payer: Self-pay

## 2021-05-30 ENCOUNTER — Other Ambulatory Visit: Payer: Medicare HMO

## 2021-05-30 DIAGNOSIS — Z0000 Encounter for general adult medical examination without abnormal findings: Secondary | ICD-10-CM | POA: Diagnosis not present

## 2021-05-30 DIAGNOSIS — E042 Nontoxic multinodular goiter: Secondary | ICD-10-CM | POA: Diagnosis not present

## 2021-05-30 DIAGNOSIS — I272 Pulmonary hypertension, unspecified: Secondary | ICD-10-CM | POA: Diagnosis not present

## 2021-05-30 DIAGNOSIS — I482 Chronic atrial fibrillation, unspecified: Secondary | ICD-10-CM | POA: Diagnosis not present

## 2021-05-30 DIAGNOSIS — I10 Essential (primary) hypertension: Secondary | ICD-10-CM | POA: Diagnosis not present

## 2021-05-30 DIAGNOSIS — E7800 Pure hypercholesterolemia, unspecified: Secondary | ICD-10-CM | POA: Diagnosis not present

## 2021-05-30 DIAGNOSIS — R7309 Other abnormal glucose: Secondary | ICD-10-CM | POA: Diagnosis not present

## 2021-05-31 LAB — CBC WITH DIFFERENTIAL/PLATELET
Absolute Monocytes: 465 cells/uL (ref 200–950)
Basophils Absolute: 43 cells/uL (ref 0–200)
Basophils Relative: 0.7 %
Eosinophils Absolute: 143 cells/uL (ref 15–500)
Eosinophils Relative: 2.3 %
HCT: 38.1 % (ref 35.0–45.0)
Hemoglobin: 12 g/dL (ref 11.7–15.5)
Lymphs Abs: 1500 cells/uL (ref 850–3900)
MCH: 28.6 pg (ref 27.0–33.0)
MCHC: 31.5 g/dL — ABNORMAL LOW (ref 32.0–36.0)
MCV: 90.9 fL (ref 80.0–100.0)
MPV: 12.1 fL (ref 7.5–12.5)
Monocytes Relative: 7.5 %
Neutro Abs: 4049 cells/uL (ref 1500–7800)
Neutrophils Relative %: 65.3 %
Platelets: 163 10*3/uL (ref 140–400)
RBC: 4.19 10*6/uL (ref 3.80–5.10)
RDW: 14 % (ref 11.0–15.0)
Total Lymphocyte: 24.2 %
WBC: 6.2 10*3/uL (ref 3.8–10.8)

## 2021-05-31 LAB — COMPLETE METABOLIC PANEL WITH GFR
AG Ratio: 1.2 (calc) (ref 1.0–2.5)
ALT: 12 U/L (ref 6–29)
AST: 19 U/L (ref 10–35)
Albumin: 3.7 g/dL (ref 3.6–5.1)
Alkaline phosphatase (APISO): 90 U/L (ref 37–153)
BUN: 16 mg/dL (ref 7–25)
CO2: 26 mmol/L (ref 20–32)
Calcium: 9 mg/dL (ref 8.6–10.4)
Chloride: 107 mmol/L (ref 98–110)
Creat: 0.91 mg/dL (ref 0.50–1.05)
Globulin: 3.2 g/dL (calc) (ref 1.9–3.7)
Glucose, Bld: 98 mg/dL (ref 65–99)
Potassium: 4.7 mmol/L (ref 3.5–5.3)
Sodium: 142 mmol/L (ref 135–146)
Total Bilirubin: 0.7 mg/dL (ref 0.2–1.2)
Total Protein: 6.9 g/dL (ref 6.1–8.1)
eGFR: 69 mL/min/{1.73_m2} (ref >=60)

## 2021-05-31 LAB — LIPID PANEL
Cholesterol: 167 mg/dL (ref <200)
HDL: 60 mg/dL (ref >=50)
LDL Cholesterol (Calc): 88 mg/dL (calc)
Non-HDL Cholesterol (Calc): 107 mg/dL (calc) (ref <130)
Total CHOL/HDL Ratio: 2.8 (calc) (ref <5.0)
Triglycerides: 96 mg/dL (ref <150)

## 2021-05-31 LAB — HEMOGLOBIN A1C
Hgb A1c MFr Bld: 5.6 % of total Hgb (ref <5.7)
Mean Plasma Glucose: 114 mg/dL
eAG (mmol/L): 6.3 mmol/L

## 2021-05-31 LAB — TSH: TSH: 0.78 mIU/L (ref 0.40–4.50)

## 2021-05-31 LAB — T4, FREE: Free T4: 1 ng/dL (ref 0.8–1.8)

## 2021-06-05 ENCOUNTER — Other Ambulatory Visit: Payer: Self-pay

## 2021-06-05 ENCOUNTER — Ambulatory Visit (INDEPENDENT_AMBULATORY_CARE_PROVIDER_SITE_OTHER): Payer: Medicare HMO

## 2021-06-05 DIAGNOSIS — I059 Rheumatic mitral valve disease, unspecified: Secondary | ICD-10-CM | POA: Diagnosis not present

## 2021-06-05 DIAGNOSIS — Z5181 Encounter for therapeutic drug level monitoring: Secondary | ICD-10-CM

## 2021-06-05 DIAGNOSIS — I4891 Unspecified atrial fibrillation: Secondary | ICD-10-CM | POA: Diagnosis not present

## 2021-06-05 DIAGNOSIS — I482 Chronic atrial fibrillation, unspecified: Secondary | ICD-10-CM

## 2021-06-05 DIAGNOSIS — Z9889 Other specified postprocedural states: Secondary | ICD-10-CM

## 2021-06-05 LAB — POCT INR: INR: 4.4 — AB (ref 2.0–3.0)

## 2021-06-05 NOTE — Patient Instructions (Signed)
-   skip warfarin tonight, then  - continue dosage of warfarin of 1/2  tablet every day EXCEPT 1 tablet on MONDAYS, [place]. - Recheck INR next week

## 2021-06-06 ENCOUNTER — Ambulatory Visit (INDEPENDENT_AMBULATORY_CARE_PROVIDER_SITE_OTHER): Payer: Medicare HMO | Admitting: Family Medicine

## 2021-06-06 ENCOUNTER — Other Ambulatory Visit: Payer: Self-pay | Admitting: Family Medicine

## 2021-06-06 ENCOUNTER — Encounter: Payer: Self-pay | Admitting: Family Medicine

## 2021-06-06 VITALS — BP 133/68 | HR 89 | Ht 64.0 in | Wt 169.8 lb

## 2021-06-06 DIAGNOSIS — R7309 Other abnormal glucose: Secondary | ICD-10-CM

## 2021-06-06 DIAGNOSIS — I10 Essential (primary) hypertension: Secondary | ICD-10-CM

## 2021-06-06 DIAGNOSIS — Z0000 Encounter for general adult medical examination without abnormal findings: Secondary | ICD-10-CM

## 2021-06-06 DIAGNOSIS — I482 Chronic atrial fibrillation, unspecified: Secondary | ICD-10-CM

## 2021-06-06 DIAGNOSIS — E7800 Pure hypercholesterolemia, unspecified: Secondary | ICD-10-CM

## 2021-06-06 DIAGNOSIS — Z23 Encounter for immunization: Secondary | ICD-10-CM | POA: Diagnosis not present

## 2021-06-06 DIAGNOSIS — R1110 Vomiting, unspecified: Secondary | ICD-10-CM

## 2021-06-06 DIAGNOSIS — Z1211 Encounter for screening for malignant neoplasm of colon: Secondary | ICD-10-CM

## 2021-06-06 DIAGNOSIS — E042 Nontoxic multinodular goiter: Secondary | ICD-10-CM

## 2021-06-06 DIAGNOSIS — R1319 Other dysphagia: Secondary | ICD-10-CM | POA: Diagnosis not present

## 2021-06-06 DIAGNOSIS — E663 Overweight: Secondary | ICD-10-CM

## 2021-06-06 NOTE — Patient Instructions (Addendum)
Thank you for coming to the office today.  [facility] Thy[facility] Ultrasound - ordered on 04/18/21 by Dr Loanne Drilling see if they can schedule. Address: [address] [place], [place] [address] Phone: [phone]  Stay tuned for call from Dr Allen [facility]) [address] [place], [place] [address] Main: [phone]  They can do colonoscopy and also evaluate for the swallowing issue.  Labs look great.  Pneumonia vaccine today Prevnar20 - final dose!  Let me know when ready for refill on muscle relaxant.  DUE for FASTING BLOOD WORK (no food or drink after midnight before the lab appointment, only water or coffee without cream/sugar on the morning of)  SCHEDULE "Lab Only" visit in the morning at the clinic for lab draw in 1 YEAR  - Make sure Lab Only appointment is at about 1 week before your next appointment, so that results will be available  For Lab Results, once available within 2-3 days of blood draw, you can can log in to MyChart online to view your results and a brief explanation. Also, we can discuss results at next follow-up visit.    Please schedule a Follow-up Appointment to: Return in about 1 year (around 06/06/2022) for 1 year fasting lab only then 1 week later Annual Physical.  If you have any other questions or concerns, please feel free to call the office or send a message through [place]. You may also schedule an earlier appointment if necessary.  Additionally, you may be receiving a survey about your experience at our office within a few days to 1 week by e-mail or mail. We value your feedback.  Nobie Putnam, DO [place]

## 2021-06-06 NOTE — Progress Notes (Signed)
Subjective:    Patient ID: [name], female    DOB: 27-Aug-1955, 65 y.o.   MRN: [id number]  [name] is a 65 y.o. female presenting on 06/06/2021 for Annual Exam   HPI  Here for Annual Physical and Lab Review.  Follow-up Thyroid Nodules Low TSH Repeat labs normalized TSH T4. No diagnosis of hypothyroidism Never on medication   Overweight BMI >29 Improved weight - Diet: No particular diet. Reducing tea intake, working on improving water - Exercise:  improving gradual    CHRONIC HTN: Reports no new concerns Current Meds - Lisinopril 40mg  daily, Metoprolol XL 100mg  daily   Reports good compliance, took meds today. Tolerating well, w/o complaints. Denies CP, dyspnea, HA, edema, dizziness / lightheadedness    Chronic Atrial Fibrillation, s/p Mitral Valve Replacement, chronic anticoagulation Pulmonary HTN / PAD - Followed by [facility] Cardiology Dr Stanford Breed, continues to f/u with [facility], she had ECHO on 11/12/17 with normal LVEF and mild mod pulm HTN, lasix 20mg  every other day was added. Otherwise remains on anticoagulation with coumadin for s/p MVR, rate control for permanent AFib. - They said her heart valve is expired but functioning well - Potassium was normal range. - She has followed Cardiology w/ variable INR, has repeat today.  Health Maintenance: Due for initial pneumonia vaccine at age >62 - will receive Prevnar-20 today  Flu Shot updated COVID booster updated  Depression screen [facility] 2/9 03/11/2021 12/05/2020 06/12/2020  Decreased Interest 0 0 0  Down, Depressed, Hopeless 0 0 0  PHQ - 2 Score 0 0 0  Altered sleeping - 0 -  Tired, decreased energy - 0 -  Change in appetite - 0 -  Feeling bad or failure about yourself  - 0 -  Trouble concentrating - 0 -  Moving slowly or fidgety/restless - 0 -  Suicidal thoughts - 0 -  PHQ-9 Score - 0 -  Difficult doing work/chores - Not difficult at all -  Some recent data might be hidden    Past  Medical History:  Diagnosis Date   Anticoagulated on warfarin    Arthritis    Heart murmur    History of cardiac catheterization    a. 2008 Cath: nl cors.   History of hyperthyroidism    a. 2003--  PTU [place]   Hypercholesterolemia    Hyperlipidemia    Multinodular goiter    Permanent atrial fibrillation ([place])    a. Dx 1997; b. CHA2DS2VASc = 3-->warfarin (s/p MVR as well); c. 06/2013 Holter: HRs freq in low 100's to 1-teens w/ max rate 179-->beta blocker increased.   Pulmonary hypertension, mild (HCC)    Rheumatic heart disease    S/P MVR (mitral valve replacement)    a. 1997 s/p SJM mechanical MVR 2/2 Sev MS; b. chronic warfarin; c. 10/2017 Echo: EF 55-60%, no rwma, triv AI. Nl fxn of MV prosthesis. Sev dil LA. Mildly to mod increased PA pressures.   Past Surgical History:  Procedure Laterality Date   ANTERIOR CERVICAL DECOMP/DISCECTOMY FUSION  12-21-2001   C5 --  C6   BREAST BIOPSY Left 11/03/2016   Procedure: BREAST BIOPSY WITH NEEDLE LOCALIZATION;  Surgeon: Robert Bellow, MD;  Location: [facility] ORS;  Service: General;  Laterality: Left;   BREAST EXCISIONAL BIOPSY Left 2018   CARDIAC CATHETERIZATION  12-28-2006  [facility]   NORMAL CORONARY ARTERIES   CARPAL TUNNEL RELEASE Right 01-20-2007   COLONOSCOPY  2015   ERCP  08/15/2011   Procedure: ENDOSCOPIC  RETROGRADE CHOLANGIOPANCREATOGRAPHY (ERCP);  Surgeon: Jeryl Columbia, MD;  Location: [facility] ENDOSCOPY;  Service: Endoscopy;  Laterality: N/A;   ERCP     MULTIPLE--  INCLUDING STENTS, SPHINTEROTOMY'S  STONE REMOVAL    FEMUR IM NAIL Right 04/26/2014   Procedure: INTRAMEDULLARY (IM) NAIL FEMORAL;  Surgeon: Alta Corning, MD;  Location: [place];  Service: Orthopedics;  Laterality: Right;   I & D EXTREMITY Right 02/07/2013   Procedure: IRRIGATION AND DEBRIDEMENT OF RIGHT LOWER LEG WITH PLACEMENT OF ACELL AND WOULND VAC;  Surgeon: Theodoro Kos, DO;  Location: [place];  Service: Plastics;  Laterality: Right;   LAPAROSCOPIC  CHOLECYSTECTOMY  04-08-2002   MITRAL VALVE REPLACEMENT  1997   [facility] JUDE   ORIF ANKLE FRACTURE Right 11/05/2012   Procedure: OPEN REDUCTION INTERNAL FIXATION (ORIF) ANKLE FRACTURE only operating on right;  Surgeon: Alta Corning, MD;  Location: [place];  Service: Orthopedics;  Laterality: Right;   ORIF RIGHT ULNAR FX W/ ILIAC CREST BONE GRAFT  10-06-2008   ORIF WRIST FRACTURE Right 04/26/2014   Procedure: OPEN REDUCTION INTERNAL FIXATION (ORIF) WRIST FRACTURE;  Surgeon: Alta Corning, MD;  Location: [place];  Service: Orthopedics;  Laterality: Right;   TRANSTHORACIC ECHOCARDIOGRAM  06-29-2009  dr wall   normal lvsf/ ef 55-60%/  normal bileaflet mechanical mvr/ moderated dilated la/ moderate tr   WRIST ARTHROSCOPY Right 12-10-2007   debridement and ulnar shortening osteotomy w/ plate   Social History   Socioeconomic History   Marital status: Divorced    Spouse name: Not on file   Number of children: 0   Years of education: 12   Highest education level: 12th grade  Occupational History   Occupation: Control and instrumentation engineer    Comment: retired  Tobacco Use   Smoking status: Never   Smokeless tobacco: Never  Scientific laboratory technician Use: Never used  Substance and Sexual Activity   Alcohol use: No   Drug use: No   Sexual activity: Not on file  Other Topics Concern   Not on file  Social History Narrative   Not on file   Social Determinants of Health   Financial Resource Strain: Low Risk    Difficulty of Paying Living Expenses: Not hard at all  Food Insecurity: No Food Insecurity   Worried About Charity fundraiser in the Last Year: Never true   [place] in the Last Year: Never true  Transportation Needs: No Transportation Needs   Lack of Transportation (Medical): No   Lack of Transportation (Non-Medical): No  Physical Activity: Sufficiently Active   Days of Exercise per Week: 5 days   Minutes of Exercise per Session: 50 min  Stress: No Stress Concern Present   Feeling of Stress :  Not at all  Social Connections: Moderately Integrated   Frequency of Communication with Friends and Family: More than three times a week   Frequency of Social Gatherings with Friends and Family: More than three times a week   Attends Religious Services: More than 4 times per year   Active Member of [employer] or Organizations: Yes   Attends Music therapist: More than 4 times per year   Marital Status: Divorced  Human resources officer Violence: Not At Risk   Fear of Current or Ex-Partner: No   Emotionally Abused: No   Physically Abused: No   Sexually Abused: No   Family History  Problem Relation Age of Onset   Hypothyroidism Sister  Gallbladder disease Sister    Coronary artery disease Other    Diabetes Mother    Hypertension Mother    Heart disease Father    Stroke Father    Breast cancer Neg Hx    Current Outpatient Medications on File Prior to Visit  Medication Sig   acyclovir (ZOVIRAX) 400 MG tablet TAKE 1 TABLET BY MOUTH 3 TIMES DAILY FORFIVE TO TEN DAYS OR UNTIL HEALED. FOR FEVER BLISTER.   alendronate (FOSAMAX) 35 MG tablet TAKE 1 TABLET BY MOUTH EVERY 7 DAYS. TAKE WITH A FULL GLASS OF WATER ON AN EMPTY STOMACH.   amitriptyline (ELAVIL) 50 MG tablet Take 50 mg by mouth at bedtime as needed for sleep.    aspirin EC 81 MG tablet Take 1 tablet (81 mg total) by mouth daily.   atorvastatin (LIPITOR) 40 MG tablet TAKE 1 TABLET BY MOUTH ONCE EVERY EVENING   calcium carbonate (OSCAL) 1500 (600 Ca) MG TABS tablet Take 1 tablet by mouth daily.    cetirizine (ZYRTEC) 10 MG tablet TAKE 1 TABLET BY MOUTH ONCE A DAY   Cholecalciferol (VITAMIN D3) 2000 units capsule Take 1 capsule (2,000 Units total) by mouth daily.   dicyclomine (BENTYL) 10 MG capsule TAKE 1 CAPSULE BY MOUTH 4 TIMES A DAY BEFORE MEALS AND AT BEDTIME.   fluticasone (FLONASE) 50 MCG/ACT nasal spray PLACE 2 SPRAYS INTO BOTH NOSTRILS DAILY   furosemide (LASIX) 20 MG tablet TAKE 1 TABLET BY MOUTH EVERY OTHER DAY    lisinopril (ZESTRIL) 40 MG tablet Take 1 tablet (40 mg total) by mouth daily.   methocarbamol (ROBAXIN) 500 MG tablet Take 1 tablet (500 mg total) by mouth 4 (four) times daily as needed for muscle spasms (pain).   metoprolol succinate (TOPROL-XL) 100 MG 24 hr tablet TAKE 1.5 TABLETS BY MOUTH DAILY. TAKE WITH OR IMMEDIATELY FOLLOWING A MEAL   Multiple Vitamins-Minerals (CENTRUM SILVER ADULT 50+ PO) Take 1 tablet by mouth daily.   triamcinolone cream (KENALOG) 0.5 % Apply 1 application topically 2 (two) times daily. To affected areas, for up to 2 weeks.   warfarin (COUMADIN) 5 MG tablet TAKE 1 TABLET BY MOUTH ONCE A DAY OR AS DIRECTED BY COUMADIN CLINIC.   No current facility-administered medications on file prior to visit.    Review of Systems  Constitutional:  Negative for activity change, appetite change, chills, diaphoresis, fatigue and fever.  HENT:  Negative for congestion and hearing loss.   Eyes:  Negative for visual disturbance.  Respiratory:  Negative for cough, chest tightness, shortness of breath and wheezing.   Cardiovascular:  Negative for chest pain, palpitations and leg swelling.  Gastrointestinal:  Negative for abdominal pain, constipation, diarrhea, nausea and vomiting.  Genitourinary:  Negative for dysuria, frequency and hematuria.  Musculoskeletal:  Negative for arthralgias and neck pain.  Skin:  Negative for rash.  Neurological:  Negative for dizziness, weakness, light-headedness, numbness and headaches.  Hematological:  Negative for adenopathy.  Psychiatric/Behavioral:  Negative for behavioral problems, dysphoric mood and sleep disturbance.   Per HPI unless specifically indicated above      Objective:    BP 133/68    Pulse 89    Ht 5\' 4"  (1.626 m)    Wt 169 lb 12.8 oz (77 kg)    SpO2 100%    BMI 29.15 kg/m   Wt Readings from Last 3 Encounters:  06/06/21 169 lb 12.8 oz (77 kg)  04/18/21 169 lb 12.8 oz (77 kg)  02/14/21 173 lb 6.4 oz (78.7  kg)    Physical  Exam Vitals and nursing note reviewed.  Constitutional:      General: She is not in acute distress.    Appearance: She is well-developed. She is not diaphoretic.     Comments: Well-appearing, comfortable, cooperative  HENT:     Head: Normocephalic and atraumatic.  Eyes:     General:        Right eye: No discharge.        Left eye: No discharge.     Conjunctiva/sclera: Conjunctivae normal.     Pupils: Pupils are equal, round, and reactive to light.  Neck:     Thyroid: No thyromegaly.     Vascular: No carotid bruit.  Cardiovascular:     Rate and Rhythm: Normal rate.     Heart sounds: Normal heart sounds. No murmur heard.    Comments:  Irregularly irregular rhythm. Mechanical heart valve clicking  Pulmonary:     Effort: Pulmonary effort is normal. No respiratory distress.     Breath sounds: Normal breath sounds. No wheezing or rales.  Abdominal:     General: Bowel sounds are normal. There is no distension.     Palpations: Abdomen is soft. There is no mass.     Tenderness: There is no abdominal tenderness.  Musculoskeletal:        General: No tenderness. Normal range of motion.     Cervical back: Normal range of motion and neck supple.     Right lower leg: No edema.     Left lower leg: No edema.     Comments: Upper / Lower Extremities: - Normal muscle tone, strength bilateral upper extremities 5/5, lower extremities 5/5  Lymphadenopathy:     Cervical: No cervical adenopathy.  Skin:    General: Skin is warm and dry.     Findings: No erythema or rash.  Neurological:     Mental Status: She is alert and oriented to person, place, and time.     Comments: Distal sensation intact to light touch all extremities  Psychiatric:        Behavior: Behavior normal.     Comments: Well groomed, good eye contact, normal speech and thoughts   Results for orders placed or performed in visit on 06/05/21  POCT INR  Result Value Ref Range   INR 4.4 (A) 2.0 - 3.0      Assessment & Plan:    Problem List Items Addressed This Visit     HYPERCHOLESTEROLEMIA   GOITER, MULTINODULAR   Essential hypertension   Elevated hemoglobin A1c   Dysphagia   Relevant Orders   Ambulatory referral to Gastroenterology   Chronic atrial fibrillation ([place])   Other Visit Diagnoses     Annual physical exam    -  Primary   Regurgitation of food       Relevant Orders   Ambulatory referral to Gastroenterology   Colon cancer screening       Relevant Orders   Ambulatory referral to Gastroenterology   Need for vaccination for Strep pneumoniae       Relevant Orders   Pneumococcal conjugate vaccine 20-valent (Completed)   Overweight (BMI 25.0-29.9)           Updated Health Maintenance information Prevnar20 today Reviewed recent lab results with patient Encouraged improvement to lifestyle with diet and exercise Goal of weight loss  #Muscle spasms, chronic back pain Takes Methocarbamol 500mg  as needed PRN, not taking daily, good for a few more months until need refill  Referral to GI for Colonoscopy due for screening and also history of dysphagia / regurgitation, was already referred by Endocrine previously but not able to be scheduled.  A1c Controlled 5.6  HTN Controlled on med management  Orders Placed This Encounter  Procedures   Pneumococcal conjugate vaccine 20-valent   Ambulatory referral to Gastroenterology    Referral Priority:   Routine    Referral Type:   Consultation    Referral Reason:   Specialty Services Required    Number of Visits Requested:   1     No orders of the defined types were placed in this encounter.     Follow up plan: Return in about 1 year (around 06/06/2022) for 1 year fasting lab only then 1 week later Annual Physical.  Future labs 05/2022   Nobie Putnam, [place] [facility] 06/06/2021, 9:07 AM

## 2021-06-12 ENCOUNTER — Other Ambulatory Visit: Payer: Self-pay

## 2021-06-12 ENCOUNTER — Ambulatory Visit (INDEPENDENT_AMBULATORY_CARE_PROVIDER_SITE_OTHER): Payer: Medicare HMO | Admitting: *Deleted

## 2021-06-12 DIAGNOSIS — I059 Rheumatic mitral valve disease, unspecified: Secondary | ICD-10-CM

## 2021-06-12 DIAGNOSIS — I482 Chronic atrial fibrillation, unspecified: Secondary | ICD-10-CM

## 2021-06-12 DIAGNOSIS — I4891 Unspecified atrial fibrillation: Secondary | ICD-10-CM

## 2021-06-12 DIAGNOSIS — Z5181 Encounter for therapeutic drug level monitoring: Secondary | ICD-10-CM

## 2021-06-12 DIAGNOSIS — Z9889 Other specified postprocedural states: Secondary | ICD-10-CM

## 2021-06-12 LAB — POCT INR: INR: 3.8 — AB (ref 2.0–3.0)

## 2021-06-12 NOTE — Patient Instructions (Signed)
Description   -START taking warfarin 1/2 a tablet daily except for 1 tablet on Mondays. Recheck INR in 1 week.

## 2021-06-13 ENCOUNTER — Ambulatory Visit
Admission: RE | Admit: 2021-06-13 | Discharge: 2021-06-13 | Disposition: A | Discharge status: Home / Self Care | Payer: Medicare HMO | Source: Ambulatory Visit | Attending: Endocrinology | Admitting: Endocrinology

## 2021-06-13 ENCOUNTER — Ambulatory Visit: Payer: Medicare HMO

## 2021-06-13 DIAGNOSIS — E042 Nontoxic multinodular goiter: Secondary | ICD-10-CM | POA: Insufficient documentation

## 2021-06-13 DIAGNOSIS — E039 Hypothyroidism, unspecified: Secondary | ICD-10-CM | POA: Diagnosis not present

## 2021-06-19 ENCOUNTER — Other Ambulatory Visit: Payer: Self-pay

## 2021-06-19 ENCOUNTER — Ambulatory Visit: Payer: Medicare HMO

## 2021-06-19 DIAGNOSIS — Z9889 Other specified postprocedural states: Secondary | ICD-10-CM

## 2021-06-19 DIAGNOSIS — I059 Rheumatic mitral valve disease, unspecified: Secondary | ICD-10-CM

## 2021-06-19 DIAGNOSIS — Z5181 Encounter for therapeutic drug level monitoring: Secondary | ICD-10-CM

## 2021-06-19 DIAGNOSIS — I482 Chronic atrial fibrillation, unspecified: Secondary | ICD-10-CM | POA: Diagnosis not present

## 2021-06-19 DIAGNOSIS — I4891 Unspecified atrial fibrillation: Secondary | ICD-10-CM | POA: Diagnosis not present

## 2021-06-19 LAB — POCT INR: INR: 2.4 (ref 2.0–3.0)

## 2021-06-19 NOTE — Patient Instructions (Signed)
-  take 1 tablet tonight, then - START taking warfarin 1/2 a tablet daily except for 1 tablet on Mondays & Fridays.  - Recheck INR in 1 week.

## 2021-06-21 ENCOUNTER — Other Ambulatory Visit: Payer: Self-pay | Admitting: Pharmacist Clinician (PhC)/ Clinical Pharmacy Specialist

## 2021-06-21 DIAGNOSIS — I10 Essential (primary) hypertension: Secondary | ICD-10-CM

## 2021-06-21 MED ORDER — LISINOPRIL 40 MG PO TABS: 40.0000 mg | ORAL_TABLET | Freq: Every day | ORAL | 3 refills | Status: DC

## 2021-06-21 MED ORDER — ATORVASTATIN CALCIUM 40 MG PO TABS: ORAL_TABLET | ORAL | 3 refills | Status: DC

## 2021-06-21 MED ORDER — WARFARIN SODIUM 5 MG PO TABS: ORAL_TABLET | ORAL | 1 refills | Status: DC

## 2021-06-26 ENCOUNTER — Ambulatory Visit: Payer: Medicare HMO

## 2021-06-26 ENCOUNTER — Other Ambulatory Visit: Payer: Self-pay

## 2021-06-26 DIAGNOSIS — Z9889 Other specified postprocedural states: Secondary | ICD-10-CM

## 2021-06-26 DIAGNOSIS — I4891 Unspecified atrial fibrillation: Secondary | ICD-10-CM

## 2021-06-26 DIAGNOSIS — Z5181 Encounter for therapeutic drug level monitoring: Secondary | ICD-10-CM | POA: Diagnosis not present

## 2021-06-26 DIAGNOSIS — I059 Rheumatic mitral valve disease, unspecified: Secondary | ICD-10-CM

## 2021-06-26 DIAGNOSIS — I482 Chronic atrial fibrillation, unspecified: Secondary | ICD-10-CM

## 2021-06-26 LAB — POCT INR: INR: 3.2 — AB (ref 2.0–3.0)

## 2021-06-26 NOTE — Patient Instructions (Signed)
-   continue taking warfarin 1/2 a tablet daily except for 1 tablet on Mondays & Fridays.  - Recheck INR in 1 week.

## 2021-06-28 ENCOUNTER — Ambulatory Visit: Payer: Medicare HMO

## 2021-07-03 ENCOUNTER — Ambulatory Visit: Payer: Medicare HMO

## 2021-07-03 ENCOUNTER — Other Ambulatory Visit: Payer: Self-pay

## 2021-07-03 DIAGNOSIS — I059 Rheumatic mitral valve disease, unspecified: Secondary | ICD-10-CM

## 2021-07-03 DIAGNOSIS — Z5181 Encounter for therapeutic drug level monitoring: Secondary | ICD-10-CM

## 2021-07-03 DIAGNOSIS — Z9889 Other specified postprocedural states: Secondary | ICD-10-CM

## 2021-07-03 DIAGNOSIS — I482 Chronic atrial fibrillation, unspecified: Secondary | ICD-10-CM

## 2021-07-03 DIAGNOSIS — I4891 Unspecified atrial fibrillation: Secondary | ICD-10-CM | POA: Diagnosis not present

## 2021-07-03 LAB — POCT INR: INR: 3.3 — AB (ref 2.0–3.0)

## 2021-07-03 NOTE — Progress Notes (Signed)
nti

## 2021-07-03 NOTE — Patient Instructions (Signed)
-   continue taking warfarin 1/2 a tablet daily except for 1 tablet on Mondays & Fridays.  - Recheck INR in 2 weeks

## 2021-07-05 ENCOUNTER — Other Ambulatory Visit: Payer: Self-pay

## 2021-07-05 ENCOUNTER — Ambulatory Visit: Payer: Medicare HMO

## 2021-07-05 DIAGNOSIS — M8589 Other specified disorders of bone density and structure, multiple sites: Secondary | ICD-10-CM

## 2021-07-05 DIAGNOSIS — G8929 Other chronic pain: Secondary | ICD-10-CM

## 2021-07-05 DIAGNOSIS — M25512 Pain in left shoulder: Secondary | ICD-10-CM

## 2021-07-05 DIAGNOSIS — B001 Herpesviral vesicular dermatitis: Secondary | ICD-10-CM

## 2021-07-05 MED ORDER — METHOCARBAMOL 500 MG PO TABS: 500.0000 mg | ORAL_TABLET | Freq: Four times a day (QID) | ORAL | 3 refills | Status: DC | PRN

## 2021-07-05 MED ORDER — ACYCLOVIR 400 MG PO TABS: ORAL_TABLET | ORAL | 1 refills | Status: DC

## 2021-07-05 MED ORDER — ALENDRONATE SODIUM 35 MG PO TABS: ORAL_TABLET | ORAL | 0 refills | Status: DC

## 2021-07-06 ENCOUNTER — Other Ambulatory Visit: Payer: Self-pay | Admitting: Cardiology

## 2021-07-08 ENCOUNTER — Telehealth: Payer: Self-pay

## 2021-07-08 ENCOUNTER — Ambulatory Visit (INDEPENDENT_AMBULATORY_CARE_PROVIDER_SITE_OTHER): Payer: Medicare HMO

## 2021-07-08 ENCOUNTER — Telehealth: Payer: Medicare HMO

## 2021-07-08 DIAGNOSIS — E7800 Pure hypercholesterolemia, unspecified: Secondary | ICD-10-CM

## 2021-07-08 DIAGNOSIS — R1319 Other dysphagia: Secondary | ICD-10-CM

## 2021-07-08 DIAGNOSIS — I482 Chronic atrial fibrillation, unspecified: Secondary | ICD-10-CM

## 2021-07-08 DIAGNOSIS — I10 Essential (primary) hypertension: Secondary | ICD-10-CM

## 2021-07-08 NOTE — Chronic Care Management (AMB) (Signed)
Chronic Care Management   CCM RN Visit Note  07/08/2021 Name: [name] MRN: [id number] DOB: 1956-03-01  Subjective: [name] is a 66 y.o. year old female who is a primary care patient of Joanna Hauser, DO. The care management team was consulted for assistance with disease management and care coordination needs.    Engaged with patient by telephone for follow up visit in response to provider referral for case management and/or care coordination services.   Consent to Services:  The patient was given information about Chronic Care Management services, agreed to services, and gave verbal consent prior to initiation of services.  Please see initial visit note for detailed documentation.   Patient agreed to services and verbal consent obtained.   Assessment: Review of patient past medical history, allergies, medications, health status, including review of consultants reports, laboratory and other test data, was performed as part of comprehensive evaluation and provision of chronic care management services.   SDOH (Social Determinants of Health) assessments and interventions performed:    CCM Care Plan  No Known Allergies  Outpatient Encounter Medications as of 07/08/2021  Medication Sig Note   acyclovir (ZOVIRAX) 400 MG tablet TAKE 1 TABLET BY MOUTH 3 TIMES DAILY FORFIVE TO TEN DAYS OR UNTIL HEALED. FOR FEVER BLISTER.    alendronate (FOSAMAX) 35 MG tablet TAKE 1 TABLET BY MOUTH EVERY 7 DAYS. TAKE WITH A FULL GLASS OF WATER ON AN EMPTY STOMACH.    amitriptyline (ELAVIL) 50 MG tablet Take 50 mg by mouth at bedtime as needed for sleep.     aspirin EC 81 MG tablet Take 1 tablet (81 mg total) by mouth daily.    atorvastatin (LIPITOR) 40 MG tablet TAKE 1 TABLET BY MOUTH ONCE EVERY EVENING    calcium carbonate (OSCAL) 1500 (600 Ca) MG TABS tablet Take 1 tablet by mouth daily.     cetirizine (ZYRTEC) 10 MG tablet TAKE 1 TABLET BY MOUTH ONCE A DAY    cetirizine (ZYRTEC)  10 MG tablet Take 1 tablet by mouth daily.    Cholecalciferol (VITAMIN D3) 2000 units capsule Take 1 capsule (2,000 Units total) by mouth daily.    dicyclomine (BENTYL) 10 MG capsule TAKE 1 CAPSULE BY MOUTH 4 TIMES A DAY BEFORE MEALS AND AT BEDTIME.    FLUAD QUADRIVALENT 0.5 ML injection     fluticasone (FLONASE) 50 MCG/ACT nasal spray PLACE 2 SPRAYS INTO BOTH NOSTRILS DAILY    furosemide (LASIX) 20 MG tablet TAKE 1 TABLET BY MOUTH EVERY OTHER DAY    furosemide (LASIX) 20 MG tablet Take 1 tablet by mouth every other day.    lisinopril (ZESTRIL) 40 MG tablet Take 1 tablet (40 mg total) by mouth daily.    methocarbamol (ROBAXIN) 500 MG tablet Take 1 tablet (500 mg total) by mouth 4 (four) times daily as needed for muscle spasms (pain).    metoprolol succinate (TOPROL-XL) 100 MG 24 hr tablet TAKE 1.5 TABLETS BY MOUTH DAILY. TAKE WITH OR IMMEDIATELY FOLLOWING A MEAL 07/08/2021: Per the patient she is taking 150 mg    Multiple Vitamins-Minerals (CENTRUM SILVER ADULT 50+ PO) Take 1 tablet by mouth daily.    triamcinolone cream (KENALOG) 0.5 % Apply 1 application topically 2 (two) times daily. To affected areas, for up to 2 weeks.    warfarin (COUMADIN) 5 MG tablet Take 1/2 to 1 tablet by mouth daily as directed by coumadin clinic    No facility-administered encounter medications on file as of 07/08/2021.  Patient Active Problem List   Diagnosis Date Noted   Dysphagia 04/18/2021   Obesity (BMI 30.0-34.9) 06/08/2018   Elevated hemoglobin A1c 06/18/2017   Allergic rhinitis 12/02/2016   Vitamin D deficiency 12/02/2016   S/P MVR (mitral valve replacement) 06/03/2016   Osteopenia 06/03/2016   Environmental and seasonal allergies 06/03/2016   History of epigastric pain 03/13/2016   Breast microcalcifications 10/15/2015   Piriformis syndrome 10/02/2015   History of femur fracture 04/26/2014   Encounter for therapeutic drug monitoring 07/19/2013   Choledocholithiasis 08/14/2011   Anticoagulated on  Coumadin 08/14/2011   Mitral valve disease 08/15/2010   MITRAL VALVE REPLACEMENT, HX OF 04/24/2009   History of hyperthyroidism 04/19/2009   Essential hypertension 10/17/2008   Pulmonary hypertension ([place]) 10/17/2008   RHEUMATIC HEART DISEASE, HX OF 10/17/2008   GOITER, MULTINODULAR 10/12/2007   HYPERCHOLESTEROLEMIA 10/12/2007   Chronic atrial fibrillation ([place]) 10/12/2007   GERD 10/12/2007    Conditions to be addressed/monitored:Atrial Fibrillation, HTN, HLD, and Dysphagia  Care Plan : RNCM: General Plan of Care (Adult) for Chronic Disease Management and Care Coordination Needs  Updates made by [name], RN since 07/08/2021 12:00 AM     Problem: RNCM: Development of Plan of care for Chronic Disease Management (AFIB, HTN, HLD, Dysphagia)   Priority: High     Long-Range Goal: RNCM: Effective Management of Plan of care for Chronic Disease Management (AFIB, HTN, HLD, Dysphagia)   Start Date: 05/06/2021  Expected End Date: 05/06/2022  Priority: High  Note:   Current Barriers:  Knowledge Deficits related to plan of care for management of Atrial Fibrillation, HTN, HLD, and dysphagia  Chronic Disease Management support and education needs related to Atrial Fibrillation, HTN, HLD, and dysphagia  RNCM Clinical Goal(s):  Patient will verbalize understanding of plan for management of Atrial Fibrillation, HTN, HLD, and dysphagia as evidenced by following the plan of care, taking medications as directed, keeping follow up appointments and calling the office for changes in conditions  take all medications exactly as prescribed and will call provider for medication related questions as evidenced by compliance with medications    attend all scheduled medical appointments: 06-10-2022 with pcp, knows to call sooner if needs arise. Has appointments with Dr. Allen Norris for evaluation of dysphagia on 08-05-2021 and cardiologist on 07-24-2021 as evidenced by keeping appointments and calling for rescheduling  needs         demonstrate improved and ongoing adherence to prescribed treatment plan for Atrial Fibrillation, HTN, HLD, and dysphagia as evidenced by seeing the providers as needed, and going to appointments coming up demonstrate a decrease in Atrial Fibrillation, HTN, HLD, and dsyphagia exacerbations  as evidenced by working with the CCM team to effectively manage health and well being demonstrate ongoing self health care management ability for effective management of chronic conditions as evidenced by  working with the CCM team through collaboration with Consulting civil engineer, provider, and care team.   Interventions: 1:1 collaboration with primary care provider regarding development and update of comprehensive plan of care as evidenced by provider attestation and co-signature Inter-disciplinary care team collaboration (see longitudinal plan of care) Evaluation of current treatment plan related to  self management and patient's adherence to plan as established by provider   SDOH Barriers (Status: Goal on Track (progressing): YES.) Long Term Goal  Patient interviewed and SDOH assessment performed        Patient interviewed and appropriate assessments performed Provided patient with information about resources available and care guides to assist with  changes in SDOH or new needs  Discussed plans with patient for ongoing care management follow up and provided patient with direct contact information for care management team Advised patient to call the office for changes in SDOH, new needs or concerns    A-fib:  (Status: Goal on Track (progressing): YES.) Long Term Goal  Counseled on increased risk of stroke due to Afib and benefits of anticoagulation for stroke prevention           Reviewed importance of adherence to anticoagulant exactly as prescribed. 07-08-2021: Is adherent with lab testing and anticoagulant therapy. Had been having to have more frequent blood work done but may be able to go longer  as the levels have been more stable the last 2 times.  Advised patient to discuss fluctuations in INR and effective AFIB management with provider. 07-08-2021: The patient is thankful that her labs have stabilized out some. Was having to go weekly. Last 2 checks are in range.  Counseled on bleeding risk associated with AFIB and importance of self-monitoring for signs/symptoms of bleeding Counseled on avoidance of NSAIDs due to increased bleeding risk with anticoagulants Counseled on importance of regular laboratory monitoring as prescribed. 07-08-2021: Is compliant with labwork  Counseled on seeking medical attention after a head injury or if there is blood in the urine/stool. Denies any injuries or changes in urine or stool color. Knows what precautions to take and sx and sx to look for.  Afib action plan reviewed Screening for signs and symptoms of depression related to chronic disease state Assessed social determinant of health barriers Evaluation of laboratory monitoring of PT/INR. The patient has been having to have weekly checks due to variations in readings. Her INR has been as low as 1.1 and as high as 9.7. She states they are having a hard time regulating her INR. 07-08-2021: More stable lab work. The patient states it has been in range the last couple of times.   Dysphagia  (Status: Goal on Track (progressing): YES.) Long Term Goal  Evaluation of current treatment plan related to  Dysphagia ,  self-management and patient's adherence to plan as established by provider. 07-08-2021: The patient states her dysphagia is actually better but she did have an episode where she was drinking water and got choked. This was scary for her. She has an appointment on 08-05-2021 with Dr. Allen Norris for evaluation of dysphagia. Encouraged the patient to keep appointment for evaluation and recommendations.  Discussed plans with patient for ongoing care management follow up and provided patient with direct contact  information for care management team Advised patient to call the provider office if she has not heard from the referral for GI. Saw Endocrinoligist on 04-18-2021 and referral placed to have further evaluation of dysphagia. The patient has not heard from the provider. Will call the office for follow up on referral placed. 07-08-2021: The patient has an appointment for 08-05-2021 with Dr. Allen Norris ; Provided education to patient re: precautions when eating. The patient states she has noticed its better but sometimes she gets choked with drinking water; Reviewed medications with patient and discussed compliance; Reviewed scheduled/upcoming provider appointments including saw pcp on  06-06-2021, next appointment on 06-10-2022. Upcoming appointment with specialist on 08-05-2021;  Review of safety precautions   Hyperlipidemia:  (Status: Goal on Track (progressing): YES.) Long Term Goal  Lab Results  Component Value Date   CHOL 167 05/30/2021   HDL 60 05/30/2021   LDLCALC 88 05/30/2021   LDLDIRECT 192.5 11/04/2010  TRIG 96 05/30/2021   CHOLHDL 2.8 05/30/2021     Medication review performed; medication list updated in electronic medical record. 07-08-2021: The patient takes Lipitor 40 mg QD. Denies any issues with cholesterol medications.  Provider established cholesterol goals reviewed; Counseled on importance of regular laboratory monitoring as prescribed. 07-08-2021: Review of recent labwork in December. The patient has consistent labwork for monitoring of cholesterol levels.  Provided HLD educational materials; Reviewed role and benefits of statin for ASCVD risk reduction; Discussed strategies to manage statin-induced myalgias; Reviewed importance of limiting foods high in cholesterol. 07-08-2021: The patient states she does not have a good appetite but she is eating and is staying hydrated;  Hypertension: (Status: Goal on Track (progressing): YES.) Last practice recorded BP readings:  BP Readings  from Last 3 Encounters:  06/06/21 133/68  04/18/21 (!) 164/84  02/14/21 136/78  Most recent eGFR/CrCl:  Lab Results  Component Value Date   EGFR 69 05/30/2021    No components found for: CRCL  Evaluation of current treatment plan related to hypertension self management and patient's adherence to plan as established by provider. 07-08-2021: The patient states her cardiologist increased her metoprolol in October to 150 mg. The patient denies any light headedness or dizziness. The patient states she does check her blood pressures at home but not every day. The patient denies any issues with heart health or HTN. Will continue to monitor. ;   Provided education to patient re: stroke prevention, s/s of heart attack and stroke; Reviewed prescribed diet heart healthy diet. 07-08-2021: Is compliant with dietary restrictions.  Reviewed medications with patient and discussed importance of compliance;  Discussed plans with patient for ongoing care management follow up and provided patient with direct contact information for care management team; Advised patient, providing education and rationale, to monitor blood pressure daily and record, calling PCP for findings outside established parameters;  Advised patient to discuss blood pressure trends with provider. The patient states her blood pressure was elevated at endocrinologist office but it is better now. She has been taking at home and it is normal. States she was nervous because she had not seen the endocrinologist in a couple of years.  Provided education on prescribed diet heart healthy ;  Discussed complications of poorly controlled blood pressure such as heart disease, stroke, circulatory complications, vision complications, kidney impairment, sexual dysfunction;   Patient Goals/Self-Care Activities: Patient will self administer medications as prescribed as evidenced by self report/primary caregiver report  Patient will attend all scheduled provider  appointments as evidenced by clinician review of documented attendance to scheduled appointments and patient/caregiver report Patient will call pharmacy for medication refills as evidenced by patient report and review of pharmacy fill history as appropriate Patient will attend church or other social activities as evidenced by patient report Patient will continue to perform ADL's independently as evidenced by patient/caregiver report Patient will continue to perform IADL's independently as evidenced by patient/caregiver report Patient will call provider office for new concerns or questions as evidenced by review of documented incoming telephone call notes and patient report Patient will work with BSW to address care coordination needs and will continue to work with the clinical team to address health care and disease management related needs as evidenced by documented adherence to scheduled care management/care coordination appointments - check pulse (heart) rate before taking medicine - make a plan to exercise regularly - make a plan to eat healthy - keep all lab appointments - take medicine as prescribed - check blood  pressure 3 times per week - choose a place to take my blood pressure (home, clinic or office, retail store) - write blood pressure results in a log or diary - learn about high blood pressure - keep a blood pressure log - take blood pressure log to all doctor appointments - call doctor for signs and symptoms of high blood pressure - develop an action plan for high blood pressure - keep all doctor appointments - take medications for blood pressure exactly as prescribed - begin an exercise program - report new symptoms to your doctor - eat more whole grains, fruits and vegetables, lean meats and healthy fats - call for medicine refill 2 or 3 days before it runs out - take all medications exactly as prescribed - call doctor with any symptoms you believe are related to your  medicine - call doctor when you experience any new symptoms - go to all doctor appointments as scheduled - adhere to prescribed diet: Heart Healthy diet  Eye exam scheduled for 07-09-2021        Plan:Telephone follow up appointment with care management team member scheduled for:  09-02-2021 at [place] am  [place], MSN, [place] Mobile: [phone]

## 2021-07-08 NOTE — Patient Instructions (Signed)
Visit Information  Thank you for taking time to visit with me today. Please don't hesitate to contact me if I can be of assistance to you before our next scheduled telephone appointment.  Following are the goals we discussed today:  RNCM Clinical Goal(s):  Patient will verbalize understanding of plan for management of Atrial Fibrillation, HTN, HLD, and dysphagia as evidenced by following the plan of care, taking medications as directed, keeping follow up appointments and calling the office for changes in conditions  take all medications exactly as prescribed and will call provider for medication related questions as evidenced by compliance with medications    attend all scheduled medical appointments: 06-10-2022 with pcp, knows to call sooner if needs arise. Has appointments with Dr. Allen Norris for evaluation of dysphagia on 08-05-2021 and cardiologist on 07-24-2021 as evidenced by keeping appointments and calling for rescheduling needs         demonstrate improved and ongoing adherence to prescribed treatment plan for Atrial Fibrillation, HTN, HLD, and dysphagia as evidenced by seeing the providers as needed, and going to appointments coming up demonstrate a decrease in Atrial Fibrillation, HTN, HLD, and dsyphagia exacerbations  as evidenced by working with the CCM team to effectively manage health and well being demonstrate ongoing self health care management ability for effective management of chronic conditions as evidenced by  working with the CCM team through collaboration with Consulting civil engineer, provider, and care team.    Interventions: 1:1 collaboration with primary care provider regarding development and update of comprehensive plan of care as evidenced by provider attestation and co-signature Inter-disciplinary care team collaboration (see longitudinal plan of care) Evaluation of current treatment plan related to  self management and patient's adherence to plan as established by provider     SDOH  Barriers (Status: Goal on Track (progressing): YES.) Long Term Goal  Patient interviewed and SDOH assessment performed        Patient interviewed and appropriate assessments performed Provided patient with information about resources available and care guides to assist with changes in SDOH or new needs  Discussed plans with patient for ongoing care management follow up and provided patient with direct contact information for care management team Advised patient to call the office for changes in SDOH, new needs or concerns       A-fib:  (Status: Goal on Track (progressing): YES.) Long Term Goal  Counseled on increased risk of stroke due to Afib and benefits of anticoagulation for stroke prevention           Reviewed importance of adherence to anticoagulant exactly as prescribed. 07-08-2021: Is adherent with lab testing and anticoagulant therapy. Had been having to have more frequent blood work done but may be able to go longer as the levels have been more stable the last 2 times.  Advised patient to discuss fluctuations in INR and effective AFIB management with provider. 07-08-2021: The patient is thankful that her labs have stabilized out some. Was having to go weekly. Last 2 checks are in range.  Counseled on bleeding risk associated with AFIB and importance of self-monitoring for signs/symptoms of bleeding Counseled on avoidance of NSAIDs due to increased bleeding risk with anticoagulants Counseled on importance of regular laboratory monitoring as prescribed. 07-08-2021: Is compliant with labwork  Counseled on seeking medical attention after a head injury or if there is blood in the urine/stool. Denies any injuries or changes in urine or stool color. Knows what precautions to take and sx and sx to look for.  Afib action plan reviewed Screening for signs and symptoms of depression related to chronic disease state Assessed social determinant of health barriers Evaluation of laboratory monitoring of  PT/INR. The patient has been having to have weekly checks due to variations in readings. Her INR has been as low as 1.1 and as high as 9.7. She states they are having a hard time regulating her INR. 07-08-2021: More stable lab work. The patient states it has been in range the last couple of times.    Dysphagia  (Status: Goal on Track (progressing): YES.) Long Term Goal  Evaluation of current treatment plan related to  Dysphagia ,  self-management and patient's adherence to plan as established by provider. 07-08-2021: The patient states her dysphagia is actually better but she did have an episode where she was drinking water and got choked. This was scary for her. She has an appointment on 08-05-2021 with Dr. Allen Norris for evaluation of dysphagia. Encouraged the patient to keep appointment for evaluation and recommendations.  Discussed plans with patient for ongoing care management follow up and provided patient with direct contact information for care management team Advised patient to call the provider office if she has not heard from the referral for GI. Saw Endocrinoligist on 04-18-2021 and referral placed to have further evaluation of dysphagia. The patient has not heard from the provider. Will call the office for follow up on referral placed. 07-08-2021: The patient has an appointment for 08-05-2021 with Dr. Allen Norris ; Provided education to patient re: precautions when eating. The patient states she has noticed its better but sometimes she gets choked with drinking water; Reviewed medications with patient and discussed compliance; Reviewed scheduled/upcoming provider appointments including saw pcp on  06-06-2021, next appointment on 06-10-2022. Upcoming appointment with specialist on 08-05-2021;  Review of safety precautions    Hyperlipidemia:  (Status: Goal on Track (progressing): YES.) Long Term Goal       Lab Results  Component Value Date    CHOL 167 05/30/2021    HDL 60 05/30/2021    LDLCALC 88 05/30/2021     LDLDIRECT 192.5 11/04/2010    TRIG 96 05/30/2021    CHOLHDL 2.8 05/30/2021      Medication review performed; medication list updated in electronic medical record. 07-08-2021: The patient takes Lipitor 40 mg QD. Denies any issues with cholesterol medications.  Provider established cholesterol goals reviewed; Counseled on importance of regular laboratory monitoring as prescribed. 07-08-2021: Review of recent labwork in December. The patient has consistent labwork for monitoring of cholesterol levels.  Provided HLD educational materials; Reviewed role and benefits of statin for ASCVD risk reduction; Discussed strategies to manage statin-induced myalgias; Reviewed importance of limiting foods high in cholesterol. 07-08-2021: The patient states she does not have a good appetite but she is eating and is staying hydrated;   Hypertension: (Status: Goal on Track (progressing): YES.) Last practice recorded BP readings:     BP Readings from Last 3 Encounters:  06/06/21 133/68  04/18/21 (!) 164/84  02/14/21 136/78  Most recent eGFR/CrCl:       Lab Results  Component Value Date    EGFR 69 05/30/2021    No components found for: CRCL   Evaluation of current treatment plan related to hypertension self management and patient's adherence to plan as established by provider. 07-08-2021: The patient states her cardiologist increased her metoprolol in October to 150 mg. The patient denies any light headedness or dizziness. The patient states she does check her blood pressures at home  but not every day. The patient denies any issues with heart health or HTN. Will continue to monitor. ;   Provided education to patient re: stroke prevention, s/s of heart attack and stroke; Reviewed prescribed diet heart healthy diet. 07-08-2021: Is compliant with dietary restrictions.  Reviewed medications with patient and discussed importance of compliance;  Discussed plans with patient for ongoing care management follow up  and provided patient with direct contact information for care management team; Advised patient, providing education and rationale, to monitor blood pressure daily and record, calling PCP for findings outside established parameters;  Advised patient to discuss blood pressure trends with provider. The patient states her blood pressure was elevated at endocrinologist office but it is better now. She has been taking at home and it is normal. States she was nervous because she had not seen the endocrinologist in a couple of years.  Provided education on prescribed diet heart healthy ;  Discussed complications of poorly controlled blood pressure such as heart disease, stroke, circulatory complications, vision complications, kidney impairment, sexual dysfunction;    Patient Goals/Self-Care Activities: Patient will self administer medications as prescribed as evidenced by self report/primary caregiver report  Patient will attend all scheduled provider appointments as evidenced by clinician review of documented attendance to scheduled appointments and patient/caregiver report Patient will call pharmacy for medication refills as evidenced by patient report and review of pharmacy fill history as appropriate Patient will attend church or other social activities as evidenced by patient report Patient will continue to perform ADL's independently as evidenced by patient/caregiver report Patient will continue to perform IADL's independently as evidenced by patient/caregiver report Patient will call provider office for new concerns or questions as evidenced by review of documented incoming telephone call notes and patient report Patient will work with BSW to address care coordination needs and will continue to work with the clinical team to address health care and disease management related needs as evidenced by documented adherence to scheduled care management/care coordination appointments - check pulse (heart) rate  before taking medicine - make a plan to exercise regularly - make a plan to eat healthy - keep all lab appointments - take medicine as prescribed - check blood pressure 3 times per week - choose a place to take my blood pressure (home, clinic or office, retail store) - write blood pressure results in a log or diary - learn about high blood pressure - keep a blood pressure log - take blood pressure log to all doctor appointments - call doctor for signs and symptoms of high blood pressure - develop an action plan for high blood pressure - keep all doctor appointments - take medications for blood pressure exactly as prescribed - begin an exercise program - report new symptoms to your doctor - eat more whole grains, fruits and vegetables, lean meats and healthy fats - call for medicine refill 2 or 3 days before it runs out - take all medications exactly as prescribed - call doctor with any symptoms you believe are related to your medicine - call doctor when you experience any new symptoms - go to all doctor appointments as scheduled - adhere to prescribed diet: Heart Healthy diet  Eye exam scheduled for 07-09-2021           Our next appointment is by telephone on 09-02-2021 at 0945 am  Please call the care guide team at [phone] if you need to cancel or reschedule your appointment.   If you are experiencing a Mental Health  or [place] or need someone to talk to, please call the Suicide and Crisis Lifeline: 988 call the [place] National Suicide Prevention Lifeline: 431 514 2988 or TTY: 9793713151 TTY 715 093 7933) to talk to a trained counselor call 1-800-273-TALK (toll free, 24 hour hotline)   Patient verbalizes understanding of instructions and care plan provided today and agrees to view in [facility]. Active MyChart status confirmed with patient.    Noreene Larsson RN, MSN, [place] Mobile: [phone]

## 2021-07-08 NOTE — Telephone Encounter (Signed)
°  Care Management   Follow Up Note   07/08/2021 Name: [name] MRN: [id number] DOB: [birth year]   Referred by: Olin Hauser, DO Reason for referral : Chronic Care Management (RNCM: Follow up for Chronic Disease Management and Care Coordination Needs. Patients sister states she had gone to run an errand and she would be back. Will call back this afternoon. )   The RNCM was able to return call back to the patient and complete the call. See new encounter.   Follow Up Plan: Telephone follow up appointment with care management team member scheduled for: 09-02-2021 at [place] am  Noreene Larsson RN, MSN, [place] Mobile: [phone]

## 2021-07-09 ENCOUNTER — Other Ambulatory Visit: Payer: Self-pay | Admitting: *Deleted

## 2021-07-09 MED ORDER — METOPROLOL SUCCINATE ER 100 MG PO TB24: ORAL_TABLET | ORAL | 3 refills | Status: DC

## 2021-07-11 NOTE — Progress Notes (Signed)
HPI:FU atrial fibrillation and previous mitral valve replacement for rheumatic heart disease. Patient had mitral valve replacement in 1997. Cardiac catheterization in 2008 showed normal coronary arteries. ABIs with Doppler in July of 2014 normal. Holter monitor January 2015 showed heart rate was elevated and beta blocker was increased. Echocardiogram May 2019 showed normal LV function, mechanical mitral valve prosthesis with mean gradient 5 mmHg, severe left atrial enlargement and mild right ventricular enlargement.  Since last seen, she denies dyspnea on exertion, orthopnea, PND, pedal edema, chest pain, palpitations, syncope or bleeding.  Current Outpatient Medications  Medication Sig Dispense Refill   acyclovir (ZOVIRAX) 400 MG tablet TAKE 1 TABLET BY MOUTH 3 TIMES DAILY FORFIVE TO TEN DAYS OR UNTIL HEALED. FOR FEVER BLISTER. 30 tablet 1   alendronate (FOSAMAX) 35 MG tablet TAKE 1 TABLET BY MOUTH EVERY 7 DAYS. TAKE WITH A FULL GLASS OF WATER ON AN EMPTY STOMACH. 12 tablet 0   amitriptyline (ELAVIL) 50 MG tablet Take 50 mg by mouth at bedtime as needed for sleep.      aspirin EC 81 MG tablet Take 1 tablet (81 mg total) by mouth daily. 90 tablet 3   atorvastatin (LIPITOR) 40 MG tablet TAKE 1 TABLET BY MOUTH ONCE EVERY EVENING 90 tablet 3   calcium carbonate (OSCAL) 1500 (600 Ca) MG TABS tablet Take 1 tablet by mouth daily.      cetirizine (ZYRTEC) 10 MG tablet TAKE 1 TABLET BY MOUTH ONCE A DAY 90 tablet 3   cetirizine (ZYRTEC) 10 MG tablet Take 1 tablet by mouth daily.     Cholecalciferol (VITAMIN D3) 2000 units capsule Take 1 capsule (2,000 Units total) by mouth daily.     dicyclomine (BENTYL) 10 MG capsule TAKE 1 CAPSULE BY MOUTH 4 TIMES A DAY BEFORE MEALS AND AT BEDTIME. 90 capsule 1   FLUAD QUADRIVALENT 0.5 ML injection      fluticasone (FLONASE) 50 MCG/ACT nasal spray PLACE 2 SPRAYS INTO BOTH NOSTRILS DAILY 16 g 11   furosemide (LASIX) 20 MG tablet TAKE 1 TABLET BY MOUTH EVERY OTHER  DAY 45 tablet 3   furosemide (LASIX) 20 MG tablet Take 1 tablet by mouth every other day.     lisinopril (ZESTRIL) 40 MG tablet Take 1 tablet (40 mg total) by mouth daily. 90 tablet 3   methocarbamol (ROBAXIN) 500 MG tablet Take 1 tablet (500 mg total) by mouth 4 (four) times daily as needed for muscle spasms (pain). 30 tablet 3   metoprolol succinate (TOPROL-XL) 100 MG 24 hr tablet TAKE 1.5 TABLETS BY MOUTH DAILY. TAKE WITH OR IMMEDIATELY FOLLOWING A MEAL 135 tablet 3   Multiple Vitamins-Minerals (CENTRUM SILVER ADULT 50+ PO) Take 1 tablet by mouth daily.     triamcinolone cream (KENALOG) 0.5 % Apply 1 application topically 2 (two) times daily. To affected areas, for up to 2 weeks. 30 g 0   warfarin (COUMADIN) 5 MG tablet Take 1/2 to 1 tablet by mouth daily as directed by coumadin clinic 90 tablet 1   No current facility-administered medications for this visit.     Past Medical History:  Diagnosis Date   Anticoagulated on warfarin    Arthritis    Heart murmur    History of cardiac catheterization    a. 2008 Cath: nl cors.   History of hyperthyroidism    a. 2003--  PTU [place]   Hypercholesterolemia    Hyperlipidemia    Multinodular goiter    Permanent atrial fibrillation ([place])  a. Dx 1997; b. CHA2DS2VASc = 3-->warfarin (s/p MVR as well); c. 06/2013 Holter: HRs freq in low 100's to 1-teens w/ max rate 179-->beta blocker increased.   Pulmonary hypertension, mild (HCC)    Rheumatic heart disease    S/P MVR (mitral valve replacement)    a. 1997 s/p SJM mechanical MVR 2/2 Sev MS; b. chronic warfarin; c. 10/2017 Echo: EF 55-60%, no rwma, triv AI. Nl fxn of MV prosthesis. Sev dil LA. Mildly to mod increased PA pressures.    Past Surgical History:  Procedure Laterality Date   ANTERIOR CERVICAL DECOMP/DISCECTOMY FUSION  12-21-2001   C5 --  C6   BREAST BIOPSY Left 11/03/2016   Procedure: BREAST BIOPSY WITH NEEDLE LOCALIZATION;  Surgeon: Robert Bellow, MD;  Location: [facility] ORS;  Service:  General;  Laterality: Left;   BREAST EXCISIONAL BIOPSY Left 2018   CARDIAC CATHETERIZATION  12-28-2006  [facility]   NORMAL CORONARY ARTERIES   CARPAL TUNNEL RELEASE Right 01-20-2007   COLONOSCOPY  2015   ERCP  08/15/2011   Procedure: ENDOSCOPIC RETROGRADE CHOLANGIOPANCREATOGRAPHY (ERCP);  Surgeon: Jeryl Columbia, MD;  Location: [facility] ENDOSCOPY;  Service: Endoscopy;  Laterality: N/A;   ERCP     MULTIPLE--  INCLUDING STENTS, SPHINTEROTOMY'S  STONE REMOVAL    FEMUR IM NAIL Right 04/26/2014   Procedure: INTRAMEDULLARY (IM) NAIL FEMORAL;  Surgeon: Alta Corning, MD;  Location: [place];  Service: Orthopedics;  Laterality: Right;   I & D EXTREMITY Right 02/07/2013   Procedure: IRRIGATION AND DEBRIDEMENT OF RIGHT LOWER LEG WITH PLACEMENT OF ACELL AND WOULND VAC;  Surgeon: Theodoro Kos, DO;  Location: [place];  Service: Plastics;  Laterality: Right;   LAPAROSCOPIC CHOLECYSTECTOMY  04-08-2002   MITRAL VALVE REPLACEMENT  1997   [facility]   ORIF ANKLE FRACTURE Right 11/05/2012   Procedure: OPEN REDUCTION INTERNAL FIXATION (ORIF) ANKLE FRACTURE only operating on right;  Surgeon: Alta Corning, MD;  Location: [place];  Service: Orthopedics;  Laterality: Right;   ORIF RIGHT ULNAR FX W/ ILIAC CREST BONE GRAFT  10-06-2008   ORIF WRIST FRACTURE Right 04/26/2014   Procedure: OPEN REDUCTION INTERNAL FIXATION (ORIF) WRIST FRACTURE;  Surgeon: Alta Corning, MD;  Location: [place];  Service: Orthopedics;  Laterality: Right;   TRANSTHORACIC ECHOCARDIOGRAM  06-29-2009  dr [facility]   normal lvsf/ ef 55-60%/  normal bileaflet mechanical mvr/ moderated dilated la/ moderate tr   WRIST ARTHROSCOPY Right 12-10-2007   debridement and ulnar shortening osteotomy w/ plate    Social History   Socioeconomic History   Marital status: Divorced    Spouse name: Not on file   Number of children: 0   Years of education: 12   Highest education level: 12th grade  Occupational History   Occupation: Control and instrumentation engineer     Comment: retired  Tobacco Use   Smoking status: Never   Smokeless tobacco: Never  Scientific laboratory technician Use: Never used  Substance and Sexual Activity   Alcohol use: No   Drug use: No   Sexual activity: Not on file  Other Topics Concern   Not on file  Social History Narrative   Not on file   Social Determinants of Health   Financial Resource Strain: Low Risk    Difficulty of Paying Living Expenses: Not hard at all  Food Insecurity: No Food Insecurity   Worried About Charity fundraiser in the Last Year: Never true   [place] in the Last  Year: Never true  Transportation Needs: No Transportation Needs   Lack of Transportation (Medical): No   Lack of Transportation (Non-Medical): No  Physical Activity: Sufficiently Active   Days of Exercise per Week: 5 days   Minutes of Exercise per Session: 50 min  Stress: No Stress Concern Present   Feeling of Stress : Not at all  Social Connections: Moderately Integrated   Frequency of Communication with Friends and Family: More than three times a week   Frequency of Social Gatherings with Friends and Family: More than three times a week   Attends Religious Services: More than 4 times per year   Active Member of [employer] or Organizations: Yes   Attends Music therapist: More than 4 times per year   Marital Status: Divorced  Human resources officer Violence: Not At Risk   Fear of Current or Ex-Partner: No   Emotionally Abused: No   Physically Abused: No   Sexually Abused: No    Family History  Problem Relation Age of Onset   Hypothyroidism Sister    Gallbladder disease Sister    Coronary artery disease Other    Diabetes Mother    Hypertension Mother    Heart disease Father    Stroke Father    Breast cancer Neg Hx     ROS: no fevers or chills, productive cough, hemoptysis, dysphasia, odynophagia, melena, hematochezia, dysuria, hematuria, rash, seizure activity, orthopnea, PND, pedal edema, claudication. Remaining systems  are negative.  Physical Exam: Well-developed well-nourished in no acute distress.  Skin is warm and dry.  HEENT is normal.  Neck is supple.  Chest is clear to auscultation with normal expansion.  Cardiovascular exam is irregular, crisp mechanical valve sound Abdominal exam nontender or distended. No masses palpated. Extremities show no edema. neuro grossly intact  ECG-atrial fibrillation at a rate of 78, inferior infarct, left ventricular hypertrophy.  Personally reviewed  A/P  1 status post mitral valve replacement-plan to continue Coumadin with goal INR 2.5-3.5.  Continue aspirin 81 mg daily.  Continue SBE prophylaxis.  2 permanent atrial fibrillation-heart rate is controlled today.  We will continue beta-blocker at present dose.  Continue Coumadin.  3 hypertension-patient's blood pressure is elevated; however she follows this closely at home and it is typically controlled.  Continue present medications and follow  4 hyperlipidemia-continue statin.  Kirk Ruths, MD

## 2021-07-17 ENCOUNTER — Ambulatory Visit: Payer: Medicare HMO

## 2021-07-17 ENCOUNTER — Other Ambulatory Visit: Payer: Self-pay

## 2021-07-17 DIAGNOSIS — Z5181 Encounter for therapeutic drug level monitoring: Secondary | ICD-10-CM

## 2021-07-17 DIAGNOSIS — I4891 Unspecified atrial fibrillation: Secondary | ICD-10-CM | POA: Diagnosis not present

## 2021-07-17 DIAGNOSIS — I482 Chronic atrial fibrillation, unspecified: Secondary | ICD-10-CM | POA: Diagnosis not present

## 2021-07-17 DIAGNOSIS — I059 Rheumatic mitral valve disease, unspecified: Secondary | ICD-10-CM

## 2021-07-17 DIAGNOSIS — Z9889 Other specified postprocedural states: Secondary | ICD-10-CM | POA: Diagnosis not present

## 2021-07-17 LAB — POCT INR: INR: 2.5 (ref 2.0–3.0)

## 2021-07-17 NOTE — Patient Instructions (Signed)
-   take 1 whole tablet warfarin tonight, then - continue taking warfarin 1/2 a tablet daily except for 1 tablet on Mondays & Fridays.  - Recheck INR in 2 weeks

## 2021-07-18 DIAGNOSIS — Z0100 Encounter for examination of eyes and vision without abnormal findings: Secondary | ICD-10-CM | POA: Diagnosis not present

## 2021-07-23 DIAGNOSIS — I10 Essential (primary) hypertension: Secondary | ICD-10-CM | POA: Diagnosis not present

## 2021-07-23 DIAGNOSIS — E7800 Pure hypercholesterolemia, unspecified: Secondary | ICD-10-CM | POA: Diagnosis not present

## 2021-07-23 DIAGNOSIS — I482 Chronic atrial fibrillation, unspecified: Secondary | ICD-10-CM

## 2021-07-23 DIAGNOSIS — E785 Hyperlipidemia, unspecified: Secondary | ICD-10-CM

## 2021-07-24 ENCOUNTER — Encounter: Payer: Self-pay | Admitting: Cardiology

## 2021-07-24 ENCOUNTER — Other Ambulatory Visit: Payer: Self-pay

## 2021-07-24 ENCOUNTER — Ambulatory Visit (INDEPENDENT_AMBULATORY_CARE_PROVIDER_SITE_OTHER): Payer: Medicare HMO | Admitting: Cardiology

## 2021-07-24 VITALS — BP 160/76 | HR 78 | Ht 64.0 in | Wt 172.2 lb

## 2021-07-24 DIAGNOSIS — I10 Essential (primary) hypertension: Secondary | ICD-10-CM | POA: Diagnosis not present

## 2021-07-24 DIAGNOSIS — I4821 Permanent atrial fibrillation: Secondary | ICD-10-CM | POA: Diagnosis not present

## 2021-07-24 DIAGNOSIS — Z9889 Other specified postprocedural states: Secondary | ICD-10-CM | POA: Diagnosis not present

## 2021-07-24 DIAGNOSIS — E782 Mixed hyperlipidemia: Secondary | ICD-10-CM | POA: Diagnosis not present

## 2021-07-24 DIAGNOSIS — Z954 Presence of other heart-valve replacement: Secondary | ICD-10-CM

## 2021-07-24 MED ORDER — FUROSEMIDE 20 MG PO TABS: 20.0000 mg | ORAL_TABLET | ORAL | 3 refills | Status: DC

## 2021-07-24 NOTE — Patient Instructions (Signed)

## 2021-07-31 ENCOUNTER — Ambulatory Visit: Payer: Medicare HMO

## 2021-07-31 ENCOUNTER — Other Ambulatory Visit: Payer: Self-pay

## 2021-07-31 DIAGNOSIS — I4891 Unspecified atrial fibrillation: Secondary | ICD-10-CM

## 2021-07-31 DIAGNOSIS — I482 Chronic atrial fibrillation, unspecified: Secondary | ICD-10-CM | POA: Diagnosis not present

## 2021-07-31 DIAGNOSIS — Z9889 Other specified postprocedural states: Secondary | ICD-10-CM | POA: Diagnosis not present

## 2021-07-31 DIAGNOSIS — Z5181 Encounter for therapeutic drug level monitoring: Secondary | ICD-10-CM | POA: Diagnosis not present

## 2021-07-31 DIAGNOSIS — I059 Rheumatic mitral valve disease, unspecified: Secondary | ICD-10-CM | POA: Diagnosis not present

## 2021-07-31 LAB — POCT INR: INR: 2.8 (ref 2.0–3.0)

## 2021-07-31 NOTE — Patient Instructions (Addendum)
-   continue taking warfarin 1/2 a tablet daily except for 1 tablet on Mondays & Fridays.  - Recheck INR in 2 weeks

## 2021-07-31 NOTE — Progress Notes (Signed)
Pt's INR range has been 3.0-3.5 since approximately 2012.  Spoke w/ Karren Cobble, RPh about the reasoning behind this range, as Dr. Jacalyn Lefevre last ov reports range of 2.5-3.5.  He sent note to MD and Dr. Stanford Breed increase pt's INR range.  Chart updated and attempted to contact pt.  Left message on her mobile vm advising her of the change and that her reading of 2.8 today is in the new range, so advised her to take 1/2 tablet today as scheduled.  Asked her to call back w/ any questions.

## 2021-08-05 ENCOUNTER — Encounter: Payer: Self-pay | Admitting: Gastroenterology

## 2021-08-05 ENCOUNTER — Other Ambulatory Visit: Payer: Self-pay

## 2021-08-05 ENCOUNTER — Ambulatory Visit (INDEPENDENT_AMBULATORY_CARE_PROVIDER_SITE_OTHER): Payer: Medicare HMO | Admitting: Gastroenterology

## 2021-08-05 VITALS — BP 151/88 | HR 87 | Temp 98.1°F | Ht 64.0 in | Wt 168.0 lb

## 2021-08-05 DIAGNOSIS — R1319 Other dysphagia: Secondary | ICD-10-CM

## 2021-08-05 MED ORDER — NA SULFATE-K SULFATE-MG SULF 17.5-3.13-1.6 GM/177ML PO SOLN: 1.0000 | Freq: Once | ORAL | 0 refills | Status: AC

## 2021-08-05 NOTE — Addendum Note (Signed)
Addended by: Lurlean Nanny on: 08/05/2021 04:35 PM   Modules accepted: Orders

## 2021-08-05 NOTE — Progress Notes (Signed)
Gastroenterology Consultation  Referring Provider:     Nobie Putnam * Primary Care Physician:  Olin Hauser, DO Primary Gastroenterologist:  Dr. Allen Norris     Reason for Consultation:     Dysphagia        HPI:   [name] is a 66 y.o. y/o female referred for consultation & management of dysphagia by Dr. Parks Ranger, Devonne Doughty, DO.  This patient comes to see me after seeing me in the past for abdominal pain and was found to have musculoskeletal pain reproducible back in 2018 with 1 finger palpation of the abdominal wall muscles while flexing the abdominal wall muscles.  The patient also noted that her brother had a history of colonic polyps and that she was supposed to have a colonoscopy every 5 years.  The patient's last colonoscopy on record was 2012.  The patient has a history of atrial fibrillation with a mitral valve replacement in 1997 and has been on anticoagulation.  The patient was referred back in October for the dysphagia and then another referral was put in December for dysphagia with regurgitation and in need of a screening colonoscopy. She has episodes of choking on water.   Past Medical History:  Diagnosis Date   Anticoagulated on warfarin    Arthritis    Heart murmur    History of cardiac catheterization    a. 2008 Cath: nl cors.   History of hyperthyroidism    a. 2003--  PTU [place]   Hypercholesterolemia    Hyperlipidemia    Multinodular goiter    Permanent atrial fibrillation ([place])    a. Dx 1997; b. CHA2DS2VASc = 3-->warfarin (s/p MVR as well); c. 06/2013 Holter: HRs freq in low 100's to 1-teens w/ max rate 179-->beta blocker increased.   Pulmonary hypertension, mild (HCC)    Rheumatic heart disease    S/P MVR (mitral valve replacement)    a. 1997 s/p SJM mechanical MVR 2/2 Sev MS; b. chronic warfarin; c. 10/2017 Echo: EF 55-60%, no rwma, triv AI. Nl fxn of MV prosthesis. Sev dil LA. Mildly to mod increased PA pressures.    Past Surgical  History:  Procedure Laterality Date   ANTERIOR CERVICAL DECOMP/DISCECTOMY FUSION  12-21-2001   C5 --  C6   BREAST BIOPSY Left 11/03/2016   Procedure: BREAST BIOPSY WITH NEEDLE LOCALIZATION;  Surgeon: Robert Bellow, MD;  Location: [facility];  Service: General;  Laterality: Left;   BREAST EXCISIONAL BIOPSY Left 2018   CARDIAC CATHETERIZATION  12-28-2006  [facility]   NORMAL CORONARY ARTERIES   CARPAL TUNNEL RELEASE Right 01-20-2007   COLONOSCOPY  2015   ERCP  08/15/2011   Procedure: ENDOSCOPIC RETROGRADE CHOLANGIOPANCREATOGRAPHY (ERCP);  Surgeon: Jeryl Columbia, MD;  Location: [facility] ENDOSCOPY;  Service: Endoscopy;  Laterality: N/A;   ERCP     MULTIPLE--  INCLUDING STENTS, SPHINTEROTOMY'S  STONE REMOVAL    FEMUR IM NAIL Right 04/26/2014   Procedure: INTRAMEDULLARY (IM) NAIL FEMORAL;  Surgeon: Alta Corning, MD;  Location: [place];  Service: Orthopedics;  Laterality: Right;   I & D EXTREMITY Right 02/07/2013   Procedure: IRRIGATION AND DEBRIDEMENT OF RIGHT LOWER LEG WITH PLACEMENT OF ACELL AND WOULND VAC;  Surgeon: Theodoro Kos, DO;  Location: [place];  Service: Plastics;  Laterality: Right;   LAPAROSCOPIC CHOLECYSTECTOMY  04-08-2002   MITRAL VALVE REPLACEMENT  1997   ST JUDE   ORIF ANKLE FRACTURE Right 11/05/2012   Procedure: OPEN REDUCTION INTERNAL FIXATION (ORIF) ANKLE  FRACTURE only operating on right;  Surgeon: Alta Corning, MD;  Location: [place];  Service: Orthopedics;  Laterality: Right;   ORIF RIGHT ULNAR FX W/ ILIAC CREST BONE GRAFT  10-06-2008   ORIF WRIST FRACTURE Right 04/26/2014   Procedure: OPEN REDUCTION INTERNAL FIXATION (ORIF) WRIST FRACTURE;  Surgeon: Alta Corning, MD;  Location: [place];  Service: Orthopedics;  Laterality: Right;   TRANSTHORACIC ECHOCARDIOGRAM  06-29-2009  dr wall   normal lvsf/ ef 55-60%/  normal bileaflet mechanical mvr/ moderated dilated la/ moderate tr   WRIST ARTHROSCOPY Right 12-10-2007   debridement and ulnar shortening osteotomy w/ plate     Prior to Admission medications   Medication Sig Start Date End Date Taking? Authorizing Provider  acyclovir (ZOVIRAX) 400 MG tablet TAKE 1 TABLET BY MOUTH 3 TIMES DAILY FORFIVE TO TEN DAYS OR UNTIL HEALED. FOR FEVER BLISTER. 07/05/21   Karamalegos, Devonne Doughty, DO  alendronate (FOSAMAX) 35 MG tablet TAKE 1 TABLET BY MOUTH EVERY 7 DAYS. TAKE WITH A FULL GLASS OF WATER ON AN EMPTY STOMACH. 07/05/21   Parks Ranger, Devonne Doughty, DO  amitriptyline (ELAVIL) 50 MG tablet Take 50 mg by mouth at bedtime as needed for sleep.  01/03/16   [provider]  aspirin EC 81 MG tablet Take 1 tablet (81 mg total) by mouth daily. 07/17/16   Lelon Perla, MD  atorvastatin (LIPITOR) 40 MG tablet TAKE 1 TABLET BY MOUTH ONCE EVERY EVENING 06/21/21   Lelon Perla, MD  calcium carbonate (OSCAL) 1500 (600 Ca) MG TABS tablet Take 1 tablet by mouth daily.     [provider]  cetirizine (ZYRTEC) 10 MG tablet TAKE 1 TABLET BY MOUTH ONCE A DAY 01/17/21   Karamalegos, Devonne Doughty, DO  cetirizine (ZYRTEC) 10 MG tablet Take 1 tablet by mouth daily. 01/17/21   [provider]  Cholecalciferol (VITAMIN D3) 2000 units capsule Take 1 capsule (2,000 Units total) by mouth daily. 06/18/17   Karamalegos, Devonne Doughty, DO  dicyclomine (BENTYL) 10 MG capsule TAKE 1 CAPSULE BY MOUTH 4 TIMES A DAY BEFORE MEALS AND AT BEDTIME. 11/17/18   Lucilla Lame, MD  FLUAD QUADRIVALENT 0.5 ML injection  03/19/21   [provider]  fluticasone (FLONASE) 50 MCG/ACT nasal spray PLACE 2 SPRAYS INTO BOTH NOSTRILS DAILY 07/17/20   Parks Ranger, Devonne Doughty, DO  furosemide (LASIX) 20 MG tablet Take 1 tablet (20 mg total) by mouth every other day. 07/24/21   Lelon Perla, MD  lisinopril (ZESTRIL) 40 MG tablet Take 1 tablet (40 mg total) by mouth daily. 06/21/21   Lelon Perla, MD  methocarbamol (ROBAXIN) 500 MG tablet Take 1 tablet (500 mg total) by mouth 4 (four) times daily as needed for muscle spasms (pain). 07/05/21    Karamalegos, Devonne Doughty, DO  metoprolol succinate (TOPROL-XL) 100 MG 24 hr tablet TAKE 1.5 TABLETS BY MOUTH DAILY. TAKE WITH OR IMMEDIATELY FOLLOWING A MEAL 07/09/21   Lelon Perla, MD  Multiple Vitamins-Minerals (CENTRUM SILVER ADULT 50+ PO) Take 1 tablet by mouth daily.    [provider]  triamcinolone cream (KENALOG) 0.5 % Apply 1 application topically 2 (two) times daily. To affected areas, for up to 2 weeks. 12/05/20   Olin Hauser, DO  warfarin (COUMADIN) 5 MG tablet Take 1/2 to 1 tablet by mouth daily as directed by coumadin clinic 06/21/21   Lelon Perla, MD    Family History  Problem Relation Age of Onset   Hypothyroidism Sister  Gallbladder disease Sister    Coronary artery disease Other    Diabetes Mother    Hypertension Mother    Heart disease Father    Stroke Father    Breast cancer Neg Hx      Social History   Tobacco Use   Smoking status: Never   Smokeless tobacco: Never  Vaping Use   Vaping Use: Never used  Substance Use Topics   Alcohol use: No   Drug use: No    Allergies as of 08/05/2021   (No Known Allergies)    Review of Systems:    All systems reviewed and negative except where noted in HPI.   Physical Exam:  BP (!) 151/88    Pulse 87    Temp 98.1 F (36.7 C) (Oral)    Ht 5\' 4"  (1.626 m)    Wt 168 lb (76.2 kg)    BMI 28.84 kg/m  No LMP recorded. Patient is postmenopausal. General:   Alert,  Well-developed, well-nourished, pleasant and cooperative in NAD Head:  Normocephalic and atraumatic. Eyes:  Sclera clear, no icterus.   Conjunctiva pink. Ears:  Normal auditory acuity. Neck:  Supple; no masses or thyromegaly. Lungs:  Respirations even and unlabored.  Clear throughout to auscultation.   No wheezes, crackles, or rhonchi. No acute distress. Heart:  Regular rate and rhythm; no murmurs, clicks, rubs, or gallops. Abdomen:  Normal bowel sounds.  No bruits.  Soft, non-tender and non-distended without masses,  hepatosplenomegaly or hernias noted.  No guarding or rebound tenderness.  Negative Carnett sign.   Rectal:  Deferred.  Pulses:  Normal pulses noted. Extremities:  No clubbing or edema.  No cyanosis. Neurologic:  Alert and oriented x3;  grossly normal neurologically. Skin:  Intact without significant lesions or rashes.  No jaundice. Lymph Nodes:  No significant cervical adenopathy. Psych:  Alert and cooperative. Normal mood and affect.  Imaging Studies: No results found.  Assessment and Plan:   [name] is a 66 y.o. y/o female who comes in today with a history of dysphagia with episodes of choking on water and she states that she has been unable to catch her breath when this happens.  The patient also has a brother with a history of polyps.  The patient had her last colonoscopy in 2012 and is due for repeat screening colonoscopy.  The patient will be set up for an EGD for dysphagia and a colonoscopy for screening.  The patient has been explained the plan and agrees with it.    Lucilla Lame, MD. Marval Regal    Note: This dictation was prepared with Dragon dictation along with smaller phrase technology. Any transcriptional errors that result from this process are unintentional.

## 2021-08-12 ENCOUNTER — Telehealth: Payer: Self-pay

## 2021-08-12 NOTE — Telephone Encounter (Signed)
Called pt x 2, no answer and not able to leave a message... the VM says that it is the [place] residence.... Will try again later

## 2021-08-12 NOTE — Telephone Encounter (Signed)
Patient left a voicemail and has some questions for Dr. Allen Norris nurse

## 2021-08-13 MED ORDER — CLENPIQ 10-3.5-12 MG-GM -GM/160ML PO SOLN: 1.0000 | Freq: Once | ORAL | 0 refills | Status: AC

## 2021-08-13 NOTE — Telephone Encounter (Signed)
Pt called back to let me know that she called her insurance company and they will not cover the [place]. I had not received anything requesting a PA... I offered pt to come pick up sample prep of Clenpiq... Sample left in the front office with instructions

## 2021-08-13 NOTE — Addendum Note (Signed)
Addended by: Lurlean Nanny on: 08/13/2021 10:26 AM   Modules accepted: Orders

## 2021-08-14 ENCOUNTER — Telehealth: Payer: Self-pay | Admitting: *Deleted

## 2021-08-14 ENCOUNTER — Other Ambulatory Visit: Payer: Self-pay

## 2021-08-14 ENCOUNTER — Ambulatory Visit: Payer: Medicare HMO

## 2021-08-14 ENCOUNTER — Telehealth: Payer: Self-pay

## 2021-08-14 DIAGNOSIS — I482 Chronic atrial fibrillation, unspecified: Secondary | ICD-10-CM | POA: Diagnosis not present

## 2021-08-14 DIAGNOSIS — I4891 Unspecified atrial fibrillation: Secondary | ICD-10-CM | POA: Diagnosis not present

## 2021-08-14 DIAGNOSIS — Z5181 Encounter for therapeutic drug level monitoring: Secondary | ICD-10-CM

## 2021-08-14 DIAGNOSIS — I059 Rheumatic mitral valve disease, unspecified: Secondary | ICD-10-CM | POA: Diagnosis not present

## 2021-08-14 DIAGNOSIS — Z9889 Other specified postprocedural states: Secondary | ICD-10-CM | POA: Diagnosis not present

## 2021-08-14 LAB — POCT INR: INR: 2.4 (ref 2.0–3.0)

## 2021-08-14 NOTE — Telephone Encounter (Signed)
° °  Pre-operative Risk Assessment    Patient Name: [name]  DOB: 06-02-1956 MRN: [id number]      Request for Surgical Clearance    Procedure:   COLONOSCOPY  Date of Surgery:  Clearance 09/10/21                                 Surgeon:  DR. DARREN WOHL Surgeon's Group or Practice Name:  [facility] Phone number:  [phone] Fax number:  [phone]   Type of Clearance Requested:   - Medical  - Pharmacy:  Hold Aspirin and Warfarin (Coumadin) PT IS ON BOTH ASA AND WARFARIN   Type of Anesthesia:   PROPOFOL   Additional requests/questions:    [name]   08/14/2021, 10:44 AM

## 2021-08-14 NOTE — Telephone Encounter (Signed)
° °  Patient Name: [name]  DOB: [birth year] MRN: [id number]  Primary Cardiologist: Kirk Ruths, MD  Chart reviewed as part of pre-operative protocol coverage. I reached out to patient for update on how she is doing. The patient affirms she has been doing well without any new cardiac symptoms. Therefore, based on ACC/AHA guidelines, the patient would be at acceptable risk for the planned procedure without further cardiovascular testing. The patient was advised that if she develops new symptoms prior to surgery to contact our office to arrange for a follow-up visit, and she verbalized understanding.  Regarding Coumadin:   Per office protocol, patient can hold warfarin for 5 days prior to procedure.  Patient WILL need bridging with Lovenox (enoxaparin) around procedure. This will be coordinated in our [place] coumadin clinic. Pt has next Coumadin check 3/8 at which time this will be discussed with her.  Regarding Aspirin: It was not specifically known yet if requesting party is needing to hold aspirin. We do not typically advise holding aspirin for a colonoscopy. In the event the performing physician feels they absolutely need to hold for unusual circumstances of bleeding risk, Dr. Stanford Breed has previously granted clearance to hold this in an unrelated procedure in 2019. Given no recent interim cardiac interventions, this clearance still applies if necessary. Otherwise, if able to continue baby aspirin safely, would do so.  Will route this bundled recommendation to requesting provider via Epic fax function. Please call with questions.  Charlie Pitter, PA-C 08/14/2021, 12:26 PM

## 2021-08-14 NOTE — Patient Instructions (Signed)
-   try not to have any greens in the next day or 2 - continue taking warfarin 1/2 a tablet daily except for 1 tablet on Mondays & Fridays.  - Recheck INR in 2 weeks

## 2021-08-14 NOTE — Telephone Encounter (Signed)
Procedure:  COLONOSCOPY Date of procedure: 09/10/21   CrCl 59 ml/min Platelet count 163   Per office protocol, patient can hold warfarin for 5 days prior to procedure.     Patient WILL need bridging with Lovenox (enoxaparin) around procedure.   This will be coordinated in our [place] coumadin clinic.

## 2021-08-14 NOTE — Telephone Encounter (Signed)
Patient with diagnosis of mechanical mitral valve replacement on warfarin for anticoagulation.    Procedure:  COLONOSCOPY Date of procedure: 09/10/21  CrCl 59 ml/min Platelet count 163  Per office protocol, patient can hold warfarin for 5 days prior to procedure.    Patient WILL need bridging with Lovenox (enoxaparin) around procedure.  This will be coordinated in our [place] coumadin clinic.

## 2021-08-14 NOTE — Telephone Encounter (Signed)
° °  Name: [name]  DOB: 09-13-1955  MRN: [id number]   Primary Cardiologist: Kirk Ruths, MD  Chart reviewed as part of pre-operative protocol coverage. Patient was contacted 08/14/2021 in reference to pre-operative risk assessment for pending surgery as outlined below.  [name] was last seen 07/24/21 by Dr. Stanford Breed, primary cardiac hx includes hx of mitral valve replacement and permanent atrial fib. Will route to pharm for input on Coumadin. Callback Arbie Cookey) already clarfying ASA question with requesting party - we do not typically hold ASA for colonoscopy although there is a clearance in 2019 to reference if needed. Tried to call pt to ensure no change from recent OV but got VM with memory full.  Charlie Pitter, PA-C 08/14/2021, 11:02 AM

## 2021-08-22 NOTE — Telephone Encounter (Signed)
Left detailed msg on VM per HIPAA  

## 2021-08-28 ENCOUNTER — Ambulatory Visit: Payer: Medicare HMO

## 2021-08-28 ENCOUNTER — Other Ambulatory Visit: Payer: Self-pay | Admitting: Family Medicine

## 2021-08-28 ENCOUNTER — Other Ambulatory Visit: Payer: Self-pay

## 2021-08-28 DIAGNOSIS — Z5181 Encounter for therapeutic drug level monitoring: Secondary | ICD-10-CM | POA: Diagnosis not present

## 2021-08-28 DIAGNOSIS — I059 Rheumatic mitral valve disease, unspecified: Secondary | ICD-10-CM

## 2021-08-28 DIAGNOSIS — I4891 Unspecified atrial fibrillation: Secondary | ICD-10-CM

## 2021-08-28 DIAGNOSIS — I482 Chronic atrial fibrillation, unspecified: Secondary | ICD-10-CM

## 2021-08-28 DIAGNOSIS — Z9889 Other specified postprocedural states: Secondary | ICD-10-CM | POA: Diagnosis not present

## 2021-08-28 DIAGNOSIS — B001 Herpesviral vesicular dermatitis: Secondary | ICD-10-CM

## 2021-08-28 LAB — POCT INR: INR: 3.1 — AB (ref 2.0–3.0)

## 2021-08-28 MED ORDER — ENOXAPARIN SODIUM 80 MG/0.8ML IJ SOSY: 80.0000 mg | PREFILLED_SYRINGE | Freq: Two times a day (BID) | INTRAMUSCULAR | 0 refills | Status: DC

## 2021-08-28 NOTE — Patient Instructions (Addendum)
-   continue taking warfarin 1/2 a tablet daily except for 1 tablet on Mondays & Fridays.  ?- Follow instructions for Lovenox bridge ? ? ?Wednesday, March 15: Last dose of warfarin. ? ?Thursday, March 16: No warfarin or enoxaparin (Lovenox). ? ?Friday, March 17: Inject enoxaparin 80 mg in the fatty abdominal tissue at least 2 inches from the belly button twice a day about 12 hours apart, 8am and 8pm rotate sites. No warfarin. ? ?Saturday, March 18: Inject enoxaparin in the fatty tissue every 12 hours, 8am and 8pm. No warfarin. ? ?Sunday, March 19: Inject enoxaparin in the fatty tissue every 12 hours, 8am and 8pm. No warfarin. ? ?Monday, March 20: Inject enoxaparin in the fatty tissue in the morning at 8 am (No PM dose). No warfarin. ? ?Tuesday, March 21: Procedure Day - No enoxaparin - Resume warfarin in the evening or as directed by doctor (take an extra half tablet with usual dose for 2 days then resume normal dose). ? ?Wednesday, March 22: Resume enoxaparin inject in the fatty tissue every 12 hours and take warfarin ? ?Thursday, March 23: Inject enoxaparin in the fatty tissue every 12 hours and take warfarin ? ?Friday, March 24: Inject enoxaparin in the fatty tissue every 12 hours and take warfarin ? ?Saturday, March 25: Inject enoxaparin in the fatty tissue every 12 hours and take warfarin ? ?Sunday, March 26: Inject enoxaparin in the fatty tissue every 12 hours and take warfarin ? ?Monday, March 27: warfarin appt to check INR.  ?

## 2021-08-29 NOTE — Telephone Encounter (Signed)
Requested Prescriptions  ?Pending Prescriptions Disp Refills  ?? acyclovir (ZOVIRAX) 400 MG tablet [Pharmacy Med Name: ACYCLOVIR 400 MG Tablet] 30 tablet 1  ?  Sig: TAKE 1 TABLET BY MOUTH 3 TIMES DAILY FOR 5 TO 10 DAYS OR UNTIL HEALED FOR FEVER BLISTER.  ?  ? Antimicrobials:  Antiviral Agents - Anti-Herpetic Passed - 08/28/2021 10:39 AM  ?  ?  Passed - Valid encounter within last 12 months  ?  Recent Outpatient Visits   ?      ? 2 months ago Annual physical exam  ? [place], DO  ? 8 months ago GOITER, MULTINODULAR  ? [place], DO  ? 1 year ago Annual physical exam  ? [place], DO  ? 1 year ago Elevated hemoglobin A1c  ? [place], DO  ? 2 years ago Annual physical exam  ? [place], DO  ?  ?  ?Future Appointments   ?        ? In 9 months Parks Ranger, Devonne Doughty, DO [facility], [place]  ?  ? ?  ?  ?  ? ?

## 2021-08-31 DIAGNOSIS — J209 Acute bronchitis, unspecified: Secondary | ICD-10-CM | POA: Diagnosis not present

## 2021-08-31 DIAGNOSIS — B9689 Other specified bacterial agents as the cause of diseases classified elsewhere: Secondary | ICD-10-CM | POA: Diagnosis not present

## 2021-08-31 DIAGNOSIS — J019 Acute sinusitis, unspecified: Secondary | ICD-10-CM | POA: Diagnosis not present

## 2021-08-31 DIAGNOSIS — Z03818 Encounter for observation for suspected exposure to other biological agents ruled out: Secondary | ICD-10-CM | POA: Diagnosis not present

## 2021-09-02 ENCOUNTER — Other Ambulatory Visit: Payer: Self-pay

## 2021-09-02 ENCOUNTER — Telehealth: Payer: Self-pay

## 2021-09-02 ENCOUNTER — Telehealth: Payer: Medicare HMO

## 2021-09-02 ENCOUNTER — Telehealth: Payer: Self-pay | Admitting: Cardiology

## 2021-09-02 ENCOUNTER — Ambulatory Visit (INDEPENDENT_AMBULATORY_CARE_PROVIDER_SITE_OTHER): Payer: Medicare HMO

## 2021-09-02 DIAGNOSIS — J0100 Acute maxillary sinusitis, unspecified: Secondary | ICD-10-CM

## 2021-09-02 DIAGNOSIS — I482 Chronic atrial fibrillation, unspecified: Secondary | ICD-10-CM

## 2021-09-02 DIAGNOSIS — I10 Essential (primary) hypertension: Secondary | ICD-10-CM

## 2021-09-02 DIAGNOSIS — R1110 Vomiting, unspecified: Secondary | ICD-10-CM

## 2021-09-02 DIAGNOSIS — E7800 Pure hypercholesterolemia, unspecified: Secondary | ICD-10-CM

## 2021-09-02 DIAGNOSIS — R1310 Dysphagia, unspecified: Secondary | ICD-10-CM

## 2021-09-02 DIAGNOSIS — R1319 Other dysphagia: Secondary | ICD-10-CM

## 2021-09-02 MED ORDER — ENOXAPARIN SODIUM 80 MG/0.8ML IJ SOSY: 80.0000 mg | PREFILLED_SYRINGE | Freq: Two times a day (BID) | INTRAMUSCULAR | 0 refills | Status: DC

## 2021-09-02 NOTE — Telephone Encounter (Signed)
?  Care Management  ? ?Follow Up Note ? ? ?09/02/2021 ?Name: [name] MRN: [id number] DOB: 07-Jul-1955 ? ? ?Referred by: Olin Hauser, DO ?Reason for referral : Chronic Care Management (RNCM: Follow up for Chronic Disease Management and Care Coordination Needs) ? ? ?Call made back to the patient and was able to connect with the patient. See new encounter for information.  ? ?Follow Up Plan: Telephone follow up appointment with care management team member scheduled for: 09-04-2021 at 0945 am ? ?Noreene Larsson RN, MSN, CCM ?Community Care Coordinator ?[facility] ?[facility] ?Mobile: [phone]  ?

## 2021-09-02 NOTE — Telephone Encounter (Signed)
New Message: ? ? ? ? ? ?Patient says she need an order for for her Lovenox please. ?

## 2021-09-02 NOTE — Chronic Care Management (AMB) (Signed)
Chronic Care Management   CCM RN Visit Note  09/02/2021 Name: [name] MRN: [id number] DOB: 30-Apr-1956  Subjective: [name] is a 66 y.o. year old female who is a primary care patient of Olin Hauser, DO. The care management team was consulted for assistance with disease management and care coordination needs.    Engaged with patient by telephone for follow up visit in response to provider referral for case management and/or care coordination services.   Consent to Services:  The patient was given information about Chronic Care Management services, agreed to services, and gave verbal consent prior to initiation of services.  Please see initial visit note for detailed documentation.   Patient agreed to services and verbal consent obtained.   Assessment: Review of patient past medical history, allergies, medications, health status, including review of consultants reports, laboratory and other test data, was performed as part of comprehensive evaluation and provision of chronic care management services.   SDOH (Social Determinants of Health) assessments and interventions performed:    CCM Care Plan  No Known Allergies  Outpatient Encounter Medications as of 09/02/2021  Medication Sig   acyclovir (ZOVIRAX) 400 MG tablet TAKE 1 TABLET BY MOUTH 3 TIMES DAILY FOR 5 TO 10 DAYS OR UNTIL HEALED FOR FEVER BLISTER.   alendronate (FOSAMAX) 35 MG tablet TAKE 1 TABLET BY MOUTH EVERY 7 DAYS. TAKE WITH A FULL GLASS OF WATER ON AN EMPTY STOMACH.   amitriptyline (ELAVIL) 50 MG tablet Take 50 mg by mouth at bedtime as needed for sleep.    aspirin EC 81 MG tablet Take 1 tablet (81 mg total) by mouth daily.   atorvastatin (LIPITOR) 40 MG tablet TAKE 1 TABLET BY MOUTH ONCE EVERY EVENING   calcium carbonate (OSCAL) 1500 (600 Ca) MG TABS tablet Take 1 tablet by mouth daily.    cetirizine (ZYRTEC) 10 MG tablet TAKE 1 TABLET BY MOUTH ONCE A DAY   Cholecalciferol (VITAMIN D3) 2000  units capsule Take 1 capsule (2,000 Units total) by mouth daily.   dicyclomine (BENTYL) 10 MG capsule TAKE 1 CAPSULE BY MOUTH 4 TIMES A DAY BEFORE MEALS AND AT BEDTIME.   [START ON 09/06/2021] enoxaparin (LOVENOX) 80 MG/0.8ML injection Inject 0.8 mLs (80 mg total) into the skin every 12 (twelve) hours for 10 days.   fluticasone (FLONASE) 50 MCG/ACT nasal spray PLACE 2 SPRAYS INTO BOTH NOSTRILS DAILY   furosemide (LASIX) 20 MG tablet Take 1 tablet (20 mg total) by mouth every other day.   lisinopril (ZESTRIL) 40 MG tablet Take 1 tablet (40 mg total) by mouth daily.   methocarbamol (ROBAXIN) 500 MG tablet Take 1 tablet (500 mg total) by mouth 4 (four) times daily as needed for muscle spasms (pain).   metoprolol succinate (TOPROL-XL) 100 MG 24 hr tablet TAKE 1.5 TABLETS BY MOUTH DAILY. TAKE WITH OR IMMEDIATELY FOLLOWING A MEAL   Multiple Vitamins-Minerals (CENTRUM SILVER ADULT 50+ PO) Take 1 tablet by mouth daily.   triamcinolone cream (KENALOG) 0.5 % Apply 1 application topically 2 (two) times daily. To affected areas, for up to 2 weeks.   warfarin (COUMADIN) 5 MG tablet Take 1/2 to 1 tablet by mouth daily as directed by coumadin clinic   No facility-administered encounter medications on file as of 09/02/2021.    Patient Active Problem List   Diagnosis Date Noted   Dysphagia 04/18/2021   Obesity (BMI 30.0-34.9) 06/08/2018   Elevated hemoglobin A1c 06/18/2017   Allergic rhinitis 12/02/2016   Vitamin D deficiency  12/02/2016   S/P MVR (mitral valve replacement) 06/03/2016   Osteopenia 06/03/2016   Environmental and seasonal allergies 06/03/2016   History of epigastric pain 03/13/2016   Breast microcalcifications 10/15/2015   Piriformis syndrome 10/02/2015   History of femur fracture 04/26/2014   Encounter for therapeutic drug monitoring 07/19/2013   Choledocholithiasis 08/14/2011   Anticoagulated on Coumadin 08/14/2011   Mitral valve disease 08/15/2010   MITRAL VALVE REPLACEMENT, HX OF  04/24/2009   History of hyperthyroidism 04/19/2009   Essential hypertension 10/17/2008   Pulmonary hypertension ([place]) 10/17/2008   RHEUMATIC HEART DISEASE, HX OF 10/17/2008   GOITER, MULTINODULAR 10/12/2007   HYPERCHOLESTEROLEMIA 10/12/2007   Chronic atrial fibrillation ([place]) 10/12/2007   GERD 10/12/2007    Conditions to be addressed/monitored:Atrial Fibrillation, HTN, HLD, and Dysphagia, current sinus infection   Care Plan : RNCM: General Plan of Care (Adult) for Chronic Disease Management and Care Coordination Needs  Updates made by [name], RN since 09/02/2021 12:00 AM     Problem: RNCM: Development of Plan of care for Chronic Disease Management (AFIB, HTN, HLD, Dysphagia)   Priority: High     Long-Range Goal: RNCM: Effective Management of Plan of care for Chronic Disease Management (AFIB, HTN, HLD, Dysphagia)   Start Date: 05/06/2021  Expected End Date: 05/06/2022  Priority: High  Note:   Current Barriers:  Knowledge Deficits related to plan of care for management of Atrial Fibrillation, HTN, HLD, and dysphagia  Chronic Disease Management support and education needs related to Atrial Fibrillation, HTN, HLD, and dysphagia  RNCM Clinical Goal(s):  Patient will verbalize understanding of plan for management of Atrial Fibrillation, HTN, HLD, and dysphagia as evidenced by following the plan of care, taking medications as directed, keeping follow up appointments and calling the office for changes in conditions  take all medications exactly as prescribed and will call provider for medication related questions as evidenced by compliance with medications    attend all scheduled medical appointments: 06-10-2022 with pcp, knows to call sooner if needs arise. Has appointments with Dr. Allen Norris for EGD and colonoscopy on 09-10-2021, sees cardiology on a regular basis as evidenced by keeping appointments and calling for rescheduling needs         demonstrate improved and ongoing adherence to  prescribed treatment plan for Atrial Fibrillation, HTN, HLD, and dysphagia as evidenced by seeing the providers as needed, and going to appointments coming up demonstrate a decrease in Atrial Fibrillation, HTN, HLD, and dsyphagia exacerbations  as evidenced by working with the CCM team to effectively manage health and well being demonstrate ongoing self health care management ability for effective management of chronic conditions as evidenced by  working with the CCM team through collaboration with Consulting civil engineer, provider, and care team.   Interventions: 1:1 collaboration with primary care provider regarding development and update of comprehensive plan of care as evidenced by provider attestation and co-signature Inter-disciplinary care team collaboration (see longitudinal plan of care) Evaluation of current treatment plan related to  self management and patient's adherence to plan as established by provider   SDOH Barriers (Status: Goal on Track (progressing): YES.) Long Term Goal  Patient interviewed and SDOH assessment performed        Patient interviewed and appropriate assessments performed Provided patient with information about resources available and care guides to assist with changes in SDOH or new needs  Discussed plans with patient for ongoing care management follow up and provided patient with direct contact information for care management team Advised  patient to call the office for changes in SDOH, new needs or concerns    A-fib:  (Status: Goal on Track (progressing): YES.) Long Term Goal  Counseled on increased risk of stroke due to Afib and benefits of anticoagulation for stroke prevention           Reviewed importance of adherence to anticoagulant exactly as prescribed. 07-08-2021: Is adherent with lab testing and anticoagulant therapy. Had been having to have more frequent blood work done but may be able to go longer as the levels have been more stable the last 2 times.  09-02-2021: The patient is having more stable levels. Has regular lab work when needed.  Advised patient to discuss fluctuations in INR and effective AFIB management with provider. 07-08-2021: The patient is thankful that her labs have stabilized out some. Was having to go weekly. Last 2 checks are in range. 09-02-2021: Compliance noted.  Counseled on bleeding risk associated with AFIB and importance of self-monitoring for signs/symptoms of bleeding Counseled on avoidance of NSAIDs due to increased bleeding risk with anticoagulants Counseled on importance of regular laboratory monitoring as prescribed. 09-02-2021: Is compliant with labwork  Counseled on seeking medical attention after a head injury or if there is blood in the urine/stool. Denies any injuries or changes in urine or stool color. Knows what precautions to take and sx and sx to look for.  Afib action plan reviewed Screening for signs and symptoms of depression related to chronic disease state Assessed social determinant of health barriers Evaluation of laboratory monitoring of PT/INR. The patient has been having to have weekly checks due to variations in readings. Her INR has been as low as 1.1 and as high as 9.7. She states they are having a hard time regulating her INR. 07-08-2021: More stable lab work. The patient states it has been in range the last couple of times.   Dysphagia  (Status: Goal on Track (progressing): YES.) Long Term Goal  Evaluation of current treatment plan related to  Dysphagia ,  self-management and patient's adherence to plan as established by provider. 07-08-2021: The patient states her dysphagia is actually better but she did have an episode where she was drinking water and got choked. This was scary for her. She has an appointment on 08-05-2021 with Dr. Allen Norris for evaluation of dysphagia. Encouraged the patient to keep appointment for evaluation and recommendations. 09-02-2021: The patient is scheduled for an EGD and  colonoscopy on 09-10-2021. The patient states that she has not been having issues with swallowing as bad as she was but she is hopeful for some answers. The patient denies any acute distress today. Is confident with the plan of care for effective management of her dysphagia and GI health and well being.  Discussed plans with patient for ongoing care management follow up and provided patient with direct contact information for care management team Advised patient to call the provider office if she has not heard from the referral for GI. Saw Endocrinoligist on 04-18-2021 and referral placed to have further evaluation of dysphagia. The patient has not heard from the provider. Will call the office for follow up on referral placed. 07-08-2021: The patient has an appointment for 08-05-2021 with Dr. Allen Norris. 09-02-2021: Upcoming EGD and Colonoscopy ; Provided education to patient re: precautions when eating. The patient states she has noticed its better but sometimes she gets choked with drinking water; Reviewed medications with patient and discussed compliance; Reviewed scheduled/upcoming provider appointments including saw pcp on  06-06-2021, next  appointment on 06-10-2022. Upcoming appointments with specialist  Review of safety precautions   Hyperlipidemia:  (Status: Goal on Track (progressing): YES.) Long Term Goal  Lab Results  Component Value Date   CHOL 167 05/30/2021   HDL 60 05/30/2021   LDLCALC 88 05/30/2021   LDLDIRECT 192.5 11/04/2010   TRIG 96 05/30/2021   CHOLHDL 2.8 05/30/2021     Medication review performed; medication list updated in electronic medical record. 08-05-2021: The patient takes Lipitor 40 mg QD. Denies any issues with cholesterol medications.  Provider established cholesterol goals reviewed; Counseled on importance of regular laboratory monitoring as prescribed. 07-08-2021: Review of recent labwork in December. The patient has consistent labwork for monitoring of cholesterol levels.   Provided HLD educational materials; Reviewed role and benefits of statin for ASCVD risk reduction; Discussed strategies to manage statin-induced myalgias; Reviewed importance of limiting foods high in cholesterol. 09-02-2021: The patient states she does not have a good appetite but she is eating and is staying hydrated;  Hypertension: (Status: Goal on Track (progressing): YES.) Last practice recorded BP readings:  BP Readings from Last 3 Encounters:  08/05/21 (!) 151/88  07/24/21 (!) 160/76  06/06/21 133/68  Most recent eGFR/CrCl:  Lab Results  Component Value Date   EGFR 69 05/30/2021    No components found for: CRCL  Evaluation of current treatment plan related to hypertension self management and patient's adherence to plan as established by provider. 07-08-2021: The patient states her cardiologist increased her metoprolol in October to 150 mg. The patient denies any light headedness or dizziness. The patient states she does check her blood pressures at home but not every day. The patient denies any issues with heart health or HTN. Will continue to monitor. 09-02-2021: The patient with some elevations in blood pressures. She has a current sinus infection and does get anxious when going to the provider. The patient states she is feeling a lot better today. Will continue to monitor. Denies any acute findings related to her HTN or heart health today.;   Provided education to patient re: stroke prevention, s/s of heart attack and stroke; Reviewed prescribed diet heart healthy diet. 09-02-2021: Is compliant with dietary restrictions.  Reviewed medications with patient and discussed importance of compliance. 09-02-2021: The patient is compliant with medications;  Discussed plans with patient for ongoing care management follow up and provided patient with direct contact information for care management team; Advised patient, providing education and rationale, to monitor blood pressure daily and record,  calling PCP for findings outside established parameters;  Advised patient to discuss blood pressure trends with provider. The patient states her blood pressure was elevated at endocrinologist office but it is better now. She has been taking at home and it is normal. States she was nervous because she had not seen the endocrinologist in a couple of years.  Provided education on prescribed diet heart healthy ;  Discussed complications of poorly controlled blood pressure such as heart disease, stroke, circulatory complications, vision complications, kidney impairment, sexual dysfunction;    Sinus Infection  (Status: New goal. Goal on Track (progressing): YES.) Short Term Goal  Evaluation of current treatment plan related to  Sinus infection  ,  self-management and patient's adherence to plan as established by provider. Discussed plans with patient for ongoing care management follow up and provided patient with direct contact information for care management team Advised patient to call the office for changes in condition related to sinus infection, worsening sx and sx of infection: chills, fever,  green or brown drainage, questions or concerns. The patient was seen in the urgent care over the weekend for a sinus infection. She was coughing up green sputum but now states it is cream colored and not green. Education and support given. ; Provided education to patient re: sx and sx or reinfection, taking medications for the full course, calling the provider for questions or concerns related to respiratory infections or other concerns; Reviewed medications with patient and discussed compliance ; Discussed plans with patient for ongoing care management follow up and provided patient with direct contact information for care management team; Advised patient to discuss call the pcp for changes in sx and sx, questions or concerns with provider;   Patient Goals/Self-Care Activities: Patient will self administer  medications as prescribed as evidenced by self report/primary caregiver report  Patient will attend all scheduled provider appointments as evidenced by clinician review of documented attendance to scheduled appointments and patient/caregiver report Patient will call pharmacy for medication refills as evidenced by patient report and review of pharmacy fill history as appropriate Patient will attend church or other social activities as evidenced by patient report Patient will continue to perform ADL's independently as evidenced by patient/caregiver report Patient will continue to perform IADL's independently as evidenced by patient/caregiver report Patient will call provider office for new concerns or questions as evidenced by review of documented incoming telephone call notes and patient report Patient will work with BSW to address care coordination needs and will continue to work with the clinical team to address health care and disease management related needs as evidenced by documented adherence to scheduled care management/care coordination appointments - check pulse (heart) rate before taking medicine - make a plan to exercise regularly - make a plan to eat healthy - keep all lab appointments - take medicine as prescribed - check blood pressure 3 times per week - choose a place to take my blood pressure (home, clinic or office, retail store) - write blood pressure results in a log or diary - learn about high blood pressure - keep a blood pressure log - take blood pressure log to all doctor appointments - call doctor for signs and symptoms of high blood pressure - develop an action plan for high blood pressure - keep all doctor appointments - take medications for blood pressure exactly as prescribed - begin an exercise program - report new symptoms to your doctor - eat more whole grains, fruits and vegetables, lean meats and healthy fats - call for medicine refill 2 or 3 days before it  runs out - take all medications exactly as prescribed - call doctor with any symptoms you believe are related to your medicine - call doctor when you experience any new symptoms - go to all doctor appointments as scheduled - adhere to prescribed diet: Heart Healthy diet  Eye exam scheduled for 07-09-2021        Plan:Telephone follow up appointment with care management team member scheduled for:  11-04-2021 at [place] am  Noreene Larsson RN, MSN, [facility] [place] Mobile: [phone]

## 2021-09-02 NOTE — Telephone Encounter (Signed)
I spoke to the patient and pharmacy, Lovenox has been ordered/filled. ?

## 2021-09-02 NOTE — Patient Instructions (Signed)
Visit Information  Thank you for taking time to visit with me today. Please don't hesitate to contact me if I can be of assistance to you before our next scheduled telephone appointment.  Following are the goals we discussed today:  RNCM Clinical Goal(s):  Patient will verbalize understanding of plan for management of Atrial Fibrillation, HTN, HLD, and dysphagia as evidenced by following the plan of care, taking medications as directed, keeping follow up appointments and calling the office for changes in conditions  take all medications exactly as prescribed and will call provider for medication related questions as evidenced by compliance with medications    attend all scheduled medical appointments: 06-10-2022 with pcp, knows to call sooner if needs arise. Has appointments with Dr. Allen Norris for EGD and colonoscopy on 09-10-2021, sees cardiology on a regular basis as evidenced by keeping appointments and calling for rescheduling needs         demonstrate improved and ongoing adherence to prescribed treatment plan for Atrial Fibrillation, HTN, HLD, and dysphagia as evidenced by seeing the providers as needed, and going to appointments coming up demonstrate a decrease in Atrial Fibrillation, HTN, HLD, and dsyphagia exacerbations  as evidenced by working with the CCM team to effectively manage health and well being demonstrate ongoing self health care management ability for effective management of chronic conditions as evidenced by  working with the CCM team through collaboration with Consulting civil engineer, provider, and care team.    Interventions: 1:1 collaboration with primary care provider regarding development and update of comprehensive plan of care as evidenced by provider attestation and co-signature Inter-disciplinary care team collaboration (see longitudinal plan of care) Evaluation of current treatment plan related to  self management and patient's adherence to plan as established by provider      SDOH Barriers (Status: Goal on Track (progressing): YES.) Long Term Goal  Patient interviewed and SDOH assessment performed        Patient interviewed and appropriate assessments performed Provided patient with information about resources available and care guides to assist with changes in SDOH or new needs  Discussed plans with patient for ongoing care management follow up and provided patient with direct contact information for care management team Advised patient to call the office for changes in SDOH, new needs or concerns       A-fib:  (Status: Goal on Track (progressing): YES.) Long Term Goal  Counseled on increased risk of stroke due to Afib and benefits of anticoagulation for stroke prevention           Reviewed importance of adherence to anticoagulant exactly as prescribed. 07-08-2021: Is adherent with lab testing and anticoagulant therapy. Had been having to have more frequent blood work done but may be able to go longer as the levels have been more stable the last 2 times. 09-02-2021: The patient is having more stable levels. Has regular lab work when needed.  Advised patient to discuss fluctuations in INR and effective AFIB management with provider. 07-08-2021: The patient is thankful that her labs have stabilized out some. Was having to go weekly. Last 2 checks are in range. 09-02-2021: Compliance noted.  Counseled on bleeding risk associated with AFIB and importance of self-monitoring for signs/symptoms of bleeding Counseled on avoidance of NSAIDs due to increased bleeding risk with anticoagulants Counseled on importance of regular laboratory monitoring as prescribed. 09-02-2021: Is compliant with labwork  Counseled on seeking medical attention after a head injury or if there is blood in the urine/stool. Denies any injuries  or changes in urine or stool color. Knows what precautions to take and sx and sx to look for.  Afib action plan reviewed Screening for signs and symptoms of  depression related to chronic disease state Assessed social determinant of health barriers Evaluation of laboratory monitoring of PT/INR. The patient has been having to have weekly checks due to variations in readings. Her INR has been as low as 1.1 and as high as 9.7. She states they are having a hard time regulating her INR. 07-08-2021: More stable lab work. The patient states it has been in range the last couple of times.    Dysphagia  (Status: Goal on Track (progressing): YES.) Long Term Goal  Evaluation of current treatment plan related to  Dysphagia ,  self-management and patient's adherence to plan as established by provider. 07-08-2021: The patient states her dysphagia is actually better but she did have an episode where she was drinking water and got choked. This was scary for her. She has an appointment on 08-05-2021 with Dr. Allen Norris for evaluation of dysphagia. Encouraged the patient to keep appointment for evaluation and recommendations. 09-02-2021: The patient is scheduled for an EGD and colonoscopy on 09-10-2021. The patient states that she has not been having issues with swallowing as bad as she was but she is hopeful for some answers. The patient denies any acute distress today. Is confident with the plan of care for effective management of her dysphagia and GI health and well being.  Discussed plans with patient for ongoing care management follow up and provided patient with direct contact information for care management team Advised patient to call the provider office if she has not heard from the referral for GI. Saw Endocrinoligist on 04-18-2021 and referral placed to have further evaluation of dysphagia. The patient has not heard from the provider. Will call the office for follow up on referral placed. 07-08-2021: The patient has an appointment for 08-05-2021 with Dr. Allen Norris. 09-02-2021: Upcoming EGD and Colonoscopy ; Provided education to patient re: precautions when eating. The patient states she  has noticed its better but sometimes she gets choked with drinking water; Reviewed medications with patient and discussed compliance; Reviewed scheduled/upcoming provider appointments including saw pcp on  06-06-2021, next appointment on 06-10-2022. Upcoming appointments with specialist  Review of safety precautions    Hyperlipidemia:  (Status: Goal on Track (progressing): YES.) Long Term Goal       Lab Results  Component Value Date    CHOL 167 05/30/2021    HDL 60 05/30/2021    LDLCALC 88 05/30/2021    LDLDIRECT 192.5 11/04/2010    TRIG 96 05/30/2021    CHOLHDL 2.8 05/30/2021      Medication review performed; medication list updated in electronic medical record. 08-05-2021: The patient takes Lipitor 40 mg QD. Denies any issues with cholesterol medications.  Provider established cholesterol goals reviewed; Counseled on importance of regular laboratory monitoring as prescribed. 07-08-2021: Review of recent labwork in December. The patient has consistent labwork for monitoring of cholesterol levels.  Provided HLD educational materials; Reviewed role and benefits of statin for ASCVD risk reduction; Discussed strategies to manage statin-induced myalgias; Reviewed importance of limiting foods high in cholesterol. 09-02-2021: The patient states she does not have a good appetite but she is eating and is staying hydrated;   Hypertension: (Status: Goal on Track (progressing): YES.) Last practice recorded BP readings:     BP Readings from Last 3 Encounters:  08/05/21 (!) 151/88  07/24/21 (!) 160/76  06/06/21 133/68  Most recent eGFR/CrCl:       Lab Results  Component Value Date    EGFR 69 05/30/2021    No components found for: CRCL   Evaluation of current treatment plan related to hypertension self management and patient's adherence to plan as established by provider. 07-08-2021: The patient states her cardiologist increased her metoprolol in October to 150 mg. The patient denies any light  headedness or dizziness. The patient states she does check her blood pressures at home but not every day. The patient denies any issues with heart health or HTN. Will continue to monitor. 09-02-2021: The patient with some elevations in blood pressures. She has a current sinus infection and does get anxious when going to the provider. The patient states she is feeling a lot better today. Will continue to monitor. Denies any acute findings related to her HTN or heart health today.;   Provided education to patient re: stroke prevention, s/s of heart attack and stroke; Reviewed prescribed diet heart healthy diet. 09-02-2021: Is compliant with dietary restrictions.  Reviewed medications with patient and discussed importance of compliance. 09-02-2021: The patient is compliant with medications;  Discussed plans with patient for ongoing care management follow up and provided patient with direct contact information for care management team; Advised patient, providing education and rationale, to monitor blood pressure daily and record, calling PCP for findings outside established parameters;  Advised patient to discuss blood pressure trends with provider. The patient states her blood pressure was elevated at endocrinologist office but it is better now. She has been taking at home and it is normal. States she was nervous because she had not seen the endocrinologist in a couple of years.  Provided education on prescribed diet heart healthy ;  Discussed complications of poorly controlled blood pressure such as heart disease, stroke, circulatory complications, vision complications, kidney impairment, sexual dysfunction;      Sinus Infection  (Status: New goal. Goal on Track (progressing): YES.) Short Term Goal  Evaluation of current treatment plan related to  Sinus infection  ,  self-management and patient's adherence to plan as established by provider. Discussed plans with patient for ongoing care management follow up  and provided patient with direct contact information for care management team Advised patient to call the office for changes in condition related to sinus infection, worsening sx and sx of infection: chills, fever, green or brown drainage, questions or concerns. The patient was seen in the urgent care over the weekend for a sinus infection. She was coughing up green sputum but now states it is cream colored and not green. Education and support given. ; Provided education to patient re: sx and sx or reinfection, taking medications for the full course, calling the provider for questions or concerns related to respiratory infections or other concerns; Reviewed medications with patient and discussed compliance ; Discussed plans with patient for ongoing care management follow up and provided patient with direct contact information for care management team; Advised patient to discuss call the pcp for changes in sx and sx, questions or concerns with provider;    Patient Goals/Self-Care Activities: Patient will self administer medications as prescribed as evidenced by self report/primary caregiver report  Patient will attend all scheduled provider appointments as evidenced by clinician review of documented attendance to scheduled appointments and patient/caregiver report Patient will call pharmacy for medication refills as evidenced by patient report and review of pharmacy fill history as appropriate Patient will attend church or other social activities  as evidenced by patient report Patient will continue to perform ADL's independently as evidenced by patient/caregiver report Patient will continue to perform IADL's independently as evidenced by patient/caregiver report Patient will call provider office for new concerns or questions as evidenced by review of documented incoming telephone call notes and patient report Patient will work with BSW to address care coordination needs and will continue to work with  the clinical team to address health care and disease management related needs as evidenced by documented adherence to scheduled care management/care coordination appointments - check pulse (heart) rate before taking medicine - make a plan to exercise regularly - make a plan to eat healthy - keep all lab appointments - take medicine as prescribed - check blood pressure 3 times per week - choose a place to take my blood pressure (home, clinic or office, retail store) - write blood pressure results in a log or diary - learn about high blood pressure - keep a blood pressure log - take blood pressure log to all doctor appointments - call doctor for signs and symptoms of high blood pressure - develop an action plan for high blood pressure - keep all doctor appointments - take medications for blood pressure exactly as prescribed - begin an exercise program - report new symptoms to your doctor - eat more whole grains, fruits and vegetables, lean meats and healthy fats - call for medicine refill 2 or 3 days before it runs out - take all medications exactly as prescribed - call doctor with any symptoms you believe are related to your medicine - call doctor when you experience any new symptoms - go to all doctor appointments as scheduled - adhere to prescribed diet: Heart Healthy diet  Eye exam scheduled for 07-09-2021       Our next appointment is by telephone on 11-04-2021 at [place] am  Please call the care guide team at [phone] if you need to cancel or reschedule your appointment.   If you are experiencing a Mental Health or [place] or need someone to talk to, please call the Suicide and Crisis Lifeline: 988 call the [place] National Suicide Prevention Lifeline: 830-587-2403 or TTY: 586-051-6671 TTY 814-273-5617) to talk to a trained counselor call 1-800-273-TALK (toll free, 24 hour hotline)   Patient verbalizes understanding of instructions and care plan provided today  and agrees to view in [place]. Active MyChart status confirmed with patient.    Noreene Larsson RN, MSN, [place] [facility] Mobile: [phone]

## 2021-09-09 ENCOUNTER — Encounter: Payer: Self-pay | Admitting: Gastroenterology

## 2021-09-10 ENCOUNTER — Encounter: Payer: Self-pay | Admitting: Gastroenterology

## 2021-09-10 ENCOUNTER — Ambulatory Visit
Admission: RE | Admit: 2021-09-10 | Discharge: 2021-09-10 | Disposition: A | Discharge status: Home / Self Care | Payer: Medicare HMO | Attending: Gastroenterology | Admitting: Gastroenterology

## 2021-09-10 ENCOUNTER — Other Ambulatory Visit: Payer: Self-pay

## 2021-09-10 ENCOUNTER — Ambulatory Visit: Payer: Medicare HMO | Admitting: Anesthesiology

## 2021-09-10 ENCOUNTER — Encounter
Admission: RE | Disposition: A | Discharge status: Home / Self Care | Payer: Self-pay | Source: Home / Self Care | Attending: Gastroenterology

## 2021-09-10 DIAGNOSIS — D123 Benign neoplasm of transverse colon: Secondary | ICD-10-CM | POA: Insufficient documentation

## 2021-09-10 DIAGNOSIS — K5730 Diverticulosis of large intestine without perforation or abscess without bleeding: Secondary | ICD-10-CM | POA: Diagnosis not present

## 2021-09-10 DIAGNOSIS — Z1211 Encounter for screening for malignant neoplasm of colon: Secondary | ICD-10-CM | POA: Insufficient documentation

## 2021-09-10 DIAGNOSIS — K222 Esophageal obstruction: Secondary | ICD-10-CM | POA: Diagnosis not present

## 2021-09-10 DIAGNOSIS — K648 Other hemorrhoids: Secondary | ICD-10-CM | POA: Diagnosis not present

## 2021-09-10 DIAGNOSIS — I4891 Unspecified atrial fibrillation: Secondary | ICD-10-CM | POA: Insufficient documentation

## 2021-09-10 DIAGNOSIS — K2970 Gastritis, unspecified, without bleeding: Secondary | ICD-10-CM | POA: Insufficient documentation

## 2021-09-10 DIAGNOSIS — K219 Gastro-esophageal reflux disease without esophagitis: Secondary | ICD-10-CM | POA: Diagnosis not present

## 2021-09-10 DIAGNOSIS — K635 Polyp of colon: Secondary | ICD-10-CM

## 2021-09-10 DIAGNOSIS — I272 Pulmonary hypertension, unspecified: Secondary | ICD-10-CM | POA: Diagnosis not present

## 2021-09-10 DIAGNOSIS — K319 Disease of stomach and duodenum, unspecified: Secondary | ICD-10-CM | POA: Diagnosis not present

## 2021-09-10 DIAGNOSIS — I10 Essential (primary) hypertension: Secondary | ICD-10-CM | POA: Insufficient documentation

## 2021-09-10 DIAGNOSIS — R1319 Other dysphagia: Secondary | ICD-10-CM | POA: Diagnosis not present

## 2021-09-10 HISTORY — PX: COLONOSCOPY WITH PROPOFOL: SHX5780

## 2021-09-10 HISTORY — PX: ESOPHAGOGASTRODUODENOSCOPY (EGD) WITH PROPOFOL: SHX5813

## 2021-09-10 SURGERY — COLONOSCOPY WITH PROPOFOL
Anesthesia: General

## 2021-09-10 MED ORDER — METOPROLOL TARTRATE 5 MG/5ML IV SOLN
INTRAVENOUS | Status: DC | PRN
  Administered 2021-09-10 (×2): 1 mg via INTRAVENOUS

## 2021-09-10 MED ORDER — SODIUM CHLORIDE 0.9 % IV SOLN: INTRAVENOUS | Status: DC

## 2021-09-10 MED ORDER — LIDOCAINE HCL (CARDIAC) PF 100 MG/5ML IV SOSY
PREFILLED_SYRINGE | INTRAVENOUS | Status: DC | PRN
  Administered 2021-09-10: 60 mg via INTRAVENOUS

## 2021-09-10 MED ORDER — PROPOFOL 500 MG/50ML IV EMUL
INTRAVENOUS | Status: DC | PRN
  Administered 2021-09-10: 125 ug/kg/min via INTRAVENOUS

## 2021-09-10 MED ORDER — PROPOFOL 10 MG/ML IV BOLUS
INTRAVENOUS | Status: DC | PRN
Start: 2021-09-10 — End: 2021-09-10
  Administered 2021-09-10: 120 mg via INTRAVENOUS

## 2021-09-10 NOTE — Op Note (Signed)
[facility] ?Gastroenterology ?Patient Name: [name] ?Procedure Date: 09/10/2021 8:41 AM ?MRN: [id number] ?Account #: [id number] ?Date of Birth: 23-Feb-1956 ?Admit Type: Outpatient ?Age: 66 ?Room: [facility] ENDO ROOM 4 ?Gender: Female ?Note Status: Finalized ?Instrument Name: Upper-Endoscope [id number] ?Procedure:             Upper GI endoscopy ?Indications:           Dysphagia ?Providers:             Lucilla Lame MD, MD ?Referring MD:          Olin Hauser (Referring MD) ?Medicines:             Propofol per Anesthesia ?Complications:         No immediate complications. ?Procedure:             Pre-Anesthesia Assessment: ?                       - Prior to the procedure, a History and Physical was  ?                       performed, and patient medications and allergies were  ?                       reviewed. The patient's tolerance of previous  ?                       anesthesia was also reviewed. The risks and benefits  ?                       of the procedure and the sedation options and risks  ?                       were discussed with the patient. All questions were  ?                       answered, and informed consent was obtained. Prior  ?                       Anticoagulants: The patient has taken Coumadin  ?                       (warfarin), last dose was 5 days prior to procedure.  ?                       ASA Grade Assessment: II - A patient with mild  ?                       systemic disease. After reviewing the risks and  ?                       benefits, the patient was deemed in satisfactory  ?                       condition to undergo the procedure. ?                       After obtaining informed consent, the endoscope was  ?  passed under direct vision. Throughout the procedure,  ?                       the patient's blood pressure, pulse, and oxygen  ?                       saturations were monitored continuously. The Endoscope  ?                        was introduced through the mouth, and advanced to the  ?                       second part of duodenum. The upper GI endoscopy was  ?                       accomplished without difficulty. The patient tolerated  ?                       the procedure well. ?Findings: ?     One benign-appearing, intrinsic mild stenosis was found at the  ?     gastroesophageal junction. The stenosis was traversed. A TTS dilator was  ?     passed through the scope. Dilation with a 15-16.5-18 mm balloon dilator  ?     was performed to 18 mm. The dilation site was examined following  ?     endoscope reinsertion and showed complete resolution of luminal  ?     narrowing. ?     Segmental moderate inflammation characterized by erosions was found in  ?     the gastric body and in the gastric antrum. Biopsies were taken with a  ?     cold forceps for histology. ?     The examined duodenum was normal. ?Impression:            - Benign-appearing esophageal stenosis. Dilated. ?                       - Gastritis. Biopsied. ?                       - Normal examined duodenum. ?Recommendation:        - Discharge patient to home. ?                       - Resume previous diet. ?                       - Continue present medications. ?                       - Await pathology results. ?Procedure Code(s):     --- Professional --- ?                       [id number], Esophagogastroduodenoscopy, flexible,  ?                       transoral; with transendoscopic balloon dilation of  ?                       esophagus (less than 30 mm diameter) ?  43239, 59, Esophagogastroduodenoscopy, flexible,  ?                       transoral; with biopsy, single or multiple ?Diagnosis Code(s):     --- Professional --- ?                       R13.10, Dysphagia, unspecified ?                       K29.70, Gastritis, unspecified, without bleeding ?                       K22.2, Esophageal obstruction ?CPT copyright 2019 American Medical Association. All rights  reserved. ?The codes documented in this report are preliminary and upon coder review may  ?be revised to meet current compliance requirements. ?Lucilla Lame MD, MD ?09/10/2021 9:07:47 AM ?This report has been signed electronically. ?Number of Addenda: 0 ?Note Initiated On: 09/10/2021 8:41 AM ?Estimated Blood Loss:  Estimated blood loss: none. ?     [facility] ?

## 2021-09-10 NOTE — Anesthesia Preprocedure Evaluation (Addendum)
Anesthesia Evaluation  ?Patient identified by MRN, date of birth, ID band ?Patient awake ? ? ? ?Reviewed: ?Allergy & Precautions, NPO status , Patient's Chart, lab work & pertinent test results, reviewed documented beta blocker date and time  ? ?Airway ?Mallampati: III ? ?TM Distance: >3 FB ? ? ? ? Dental ? ?(+) Poor Dentition, Chipped ?  ?Pulmonary ?Recent URI , Resolved,  ?  ? ?+ wheezing ? ? ? ? ? Cardiovascular ?Exercise Tolerance: Poor ?hypertension, Pt. on medications and Pt. on home beta blockers ?pulmonary hypertension (Mild)Normal cardiovascular exam+ dysrhythmias Atrial Fibrillation + Valvular Problems/Murmurs (S/P MVR (mitral valve replacement))  ? ?10/2017 Echo: EF 55-60%, no rwma, triv AI. Nl fxn of MV prosthesis. Sev dil LA. Mildly to mod increased PA pressures. ?  ?Neuro/Psych ?negative neurological ROS ?   ? GI/Hepatic ?GERD  Controlled,Dysphagia  ?  ?Endo/Other  ?Hyperthyroidism  ? Renal/GU ?  ? ?  ?Musculoskeletal ? ?(+) Arthritis ,  ? Abdominal ?  ?Peds ? Hematology ?  ?Anesthesia Other Findings ? ? Reproductive/Obstetrics ? ?  ? ? ? ? ? ? ? ? ? ? ? ? ? ?  ?  ? ? ? ? ? ? ? ?Anesthesia Physical ? ?Anesthesia Plan ? ?ASA: III ? ?Anesthesia Plan: General  ? ?Post-op Pain Management:   ? ?Induction: Intravenous ? ?PONV Risk Score and Plan: Treatment may vary due to age or medical condition and Propofol infusion ? ?Airway Management Planned: Natural Airway and Simple Face Mask ? ?Additional Equipment:  ? ?Intra-op Plan:  ? ?Post-operative Plan:  ? ?Informed Consent: I have reviewed the patients History and Physical, chart, labs and discussed the procedure including the risks, benefits and alternatives for the proposed anesthesia with the patient or authorized representative who has indicated his/her understanding and acceptance.  ? ? ? ?Dental advisory given ? ?Plan Discussed with: CRNA and Anesthesiologist ? ?Anesthesia Plan Comments:   ? ? ? ? ? ?Anesthesia Quick  Evaluation ? ?

## 2021-09-10 NOTE — Transfer of Care (Signed)
Immediate Anesthesia Transfer of Care Note ?2 ?Patient: [name] ? ?Procedure(s) Performed: COLONOSCOPY WITH PROPOFOL ?ESOPHAGOGASTRODUODENOSCOPY (EGD) WITH PROPOFOL ? ?Patient Location: PACU ? ?Anesthesia Type:General ? ?Level of Consciousness: drowsy ? ?Airway & Oxygen Therapy: Patient Spontanous Breathing ? ?Post-op Assessment: Report given to RN ? ?Post vital signs: stable ? ?Last Vitals:  ?Vitals Value Taken Time  ?BP    ?Temp    ?Pulse    ?Resp    ?SpO2    ? ? ?Last Pain:  ?Vitals:  ? 09/10/21 0812  ?TempSrc: Temporal  ?PainSc: 0-No pain  ?   ? ?  ? ?Complications: No notable events documented. ?

## 2021-09-10 NOTE — H&P (Signed)
Midge Minium, MD [facility] [address], [address] [place], [place] [address] Phone:[phone] Fax : [phone]  Primary Care Physician:  Smitty Cords, DO Primary Gastroenterologist:  Dr. Servando Snare  Pre-Procedure History & Physical: HPI:  [name] is a 66 y.o. female is here for an endoscopy and colonoscopy.   Past Medical History:  Diagnosis Date   Anticoagulated on warfarin    Arthritis    Heart murmur    History of cardiac catheterization    a. 2008 Cath: nl cors.   History of hyperthyroidism    a. 2003--  PTU [place]   Hypercholesterolemia    Hyperlipidemia    Multinodular goiter    Permanent atrial fibrillation (HCC)    a. Dx 1997; b. CHA2DS2VASc = 3-->warfarin (s/p MVR as well); c. 06/2013 Holter: HRs freq in low 100's to 1-teens w/ max rate 179-->beta blocker increased.   Pulmonary hypertension, mild (HCC)    Rheumatic heart disease    S/P MVR (mitral valve replacement)    a. 1997 s/p SJM mechanical MVR 2/2 Sev MS; b. chronic warfarin; c. 10/2017 Echo: EF 55-60%, no rwma, triv AI. Nl fxn of MV prosthesis. Sev dil LA. Mildly to mod increased PA pressures.    Past Surgical History:  Procedure Laterality Date   ANTERIOR CERVICAL DECOMP/DISCECTOMY FUSION  12-21-2001   C5 --  C6   BREAST BIOPSY Left 11/03/2016   Procedure: BREAST BIOPSY WITH NEEDLE LOCALIZATION;  Surgeon: Earline Mayotte, MD;  Location: [facility] ORS;  Service: General;  Laterality: Left;   BREAST EXCISIONAL BIOPSY Left 2018   CARDIAC CATHETERIZATION  12-28-2006  [facility]   NORMAL CORONARY ARTERIES   CARPAL TUNNEL RELEASE Right 01-20-2007   COLONOSCOPY  2015   ERCP  08/15/2011   Procedure: ENDOSCOPIC RETROGRADE CHOLANGIOPANCREATOGRAPHY (ERCP);  Surgeon: Petra Kuba, MD;  Location[facility]COPY;  Service: Endoscopy;  Laterality: N/A;   ERCP     MULTIPLE--  INCLUDING STENTS, SPHINTEROTOMY'S  STONE REMOVAL    FEMUR IM NAIL Right 04/26/2014   Procedure: INTRAMEDULLARY (IM) NAIL FEMORAL;  Surgeon:  Harvie Junior, MD;  Location: [facility] OR;  Service: Orthopedics;  Laterality: Right;   I & D EXTREMITY Right 02/07/2013   Procedure: IRRIGATION AND DEBRIDEMENT OF RIGHT LOWER LEG WITH PLACEMENT OF ACELL AND WOULND VAC;  Surgeon: Wayland Denis, DO;  Location: [facility];  Service: Plastics;  Laterality: Right;   LAPAROSCOPIC CHOLECYSTECTOMY  04-08-2002   MITRAL VALVE REPLACEMENT  1997   [facility]   ORIF ANKLE FRACTURE Right 11/05/2012   Procedure: OPEN REDUCTION INTERNAL FIXATION (ORIF) ANKLE FRACTURE only operating on right;  Surgeon: Harvie Junior, MD;  Location: [facility] OR;  Service: Orthopedics;  Laterality: Right;   ORIF RIGHT ULNAR FX W/ ILIAC CREST BONE GRAFT  10-06-2008   ORIF WRIST FRACTURE Right 04/26/2014   Procedure: OPEN REDUCTION INTERNAL FIXATION (ORIF) WRIST FRACTURE;  Surgeon: Harvie Junior, MD;  Location: [facility] OR;  Service: Orthopedics;  Laterality: Right;   TRANSTHORACIC ECHOCARDIOGRAM  06-29-2009  dr wall   normal lvsf/ ef 55-60%/  normal bileaflet mechanical mvr/ moderated dilated la/ moderate tr   WRIST ARTHROSCOPY Right 12-10-2007   debridement and ulnar shortening osteotomy w/ plate    Prior to Admission medications   Medication Sig Start Date End Date Taking? Authorizing Provider  alendronate (FOSAMAX) 35 MG tablet TAKE 1 TABLET BY MOUTH EVERY 7 DAYS. TAKE WITH A FULL GLASS OF WATER ON AN EMPTY STOMACH. 07/05/21  Yes  Karamalegos, Netta Neat, DO  amitriptyline (ELAVIL) 50 MG tablet Take 50 mg by mouth at bedtime as needed for sleep.  01/03/16  Yes [provider]  aspirin EC 81 MG tablet Take 1 tablet (81 mg total) by mouth daily. 07/17/16  Yes Lewayne Bunting, MD  atorvastatin (LIPITOR) 40 MG tablet TAKE 1 TABLET BY MOUTH ONCE EVERY EVENING 06/21/21  Yes Lewayne Bunting, MD  calcium carbonate (OSCAL) 1500 (600 Ca) MG TABS tablet Take 1 tablet by mouth daily.    Yes [provider]  cetirizine (ZYRTEC) 10 MG tablet TAKE 1 TABLET BY MOUTH ONCE A DAY  01/17/21  Yes Karamalegos, Netta Neat, DO  Cholecalciferol (VITAMIN D3) 2000 units capsule Take 1 capsule (2,000 Units total) by mouth daily. 06/18/17  Yes Karamalegos, Alexander J, DO  enoxaparin (LOVENOX) 80 MG/0.8ML injection Inject 0.8 mLs (80 mg total) into the skin every 12 (twelve) hours for 10 days. 09/06/21 09/16/21 Yes End, Cristal Deer, MD  fluticasone [facility]) 50 MCG/ACT nasal spray PLACE 2 SPRAYS INTO BOTH NOSTRILS DAILY 07/17/20  Yes Karamalegos, Netta Neat, DO  furosemide (LASIX) 20 MG tablet Take 1 tablet (20 mg total) by mouth every other day. 07/24/21  Yes Lewayne Bunting, MD  lisinopril (ZESTRIL) 40 MG tablet Take 1 tablet (40 mg total) by mouth daily. 06/21/21  Yes Lewayne Bunting, MD  methocarbamol (ROBAXIN) 500 MG tablet Take 1 tablet (500 mg total) by mouth 4 (four) times daily as needed for muscle spasms (pain). 07/05/21  Yes Karamalegos, Netta Neat, DO  metoprolol succinate (TOPROL-XL) 100 MG 24 hr tablet TAKE 1.5 TABLETS BY MOUTH DAILY. TAKE WITH OR IMMEDIATELY FOLLOWING A MEAL 07/09/21  Yes Crenshaw, Madolyn Frieze, MD  Multiple Vitamins-Minerals (CENTRUM SILVER ADULT 50+ PO) Take 1 tablet by mouth daily.   Yes [provider]  acyclovir (ZOVIRAX) 400 MG tablet TAKE 1 TABLET BY MOUTH 3 TIMES DAILY FOR 5 TO 10 DAYS OR UNTIL HEALED FOR FEVER BLISTER. 08/29/21   Karamalegos, Netta Neat, DO  dicyclomine (BENTYL) 10 MG capsule TAKE 1 CAPSULE BY MOUTH 4 TIMES A DAY BEFORE MEALS AND AT BEDTIME. 11/17/18   Midge Minium, MD  triamcinolone cream (KENALOG) 0.5 % Apply 1 application topically 2 (two) times daily. To affected areas, for up to 2 weeks. 12/05/20   Smitty Cords, DO  warfarin (COUMADIN) 5 MG tablet Take 1/2 to 1 tablet by mouth daily as directed by coumadin clinic 06/21/21   Lewayne Bunting, MD    Allergies as of 08/06/2021   (No Known Allergies)    Family History  Problem Relation Age of Onset   Hypothyroidism Sister    Gallbladder disease Sister     Coronary artery disease Other    Diabetes Mother    Hypertension Mother    Heart disease Father    Stroke Father    Breast cancer Neg Hx     Social History   Socioeconomic History   Marital status: Divorced    Spouse name: Not on file   Number of children: 0   Years of education: 12   Highest education level: 12th grade  Occupational History   Occupation: Geologist, engineering    Comment: retired  Tobacco Use   Smoking status: Never   Smokeless tobacco: Never  Building services engineer Use: Never used  Substance and Sexual Activity   Alcohol use: No   Drug use: No   Sexual activity: Not on file  Other Topics Concern  Not on file  Social History Narrative   Not on file   Social Determinants of Health   Financial Resource Strain: Low Risk    Difficulty of Paying Living Expenses: Not hard at all  Food Insecurity: No Food Insecurity   Worried About Programme researcher, broadcasting/film/video in the Last Year: Never true   Barista in the Last Year: Never true  Transportation Needs: No Transportation Needs   Lack of Transportation (Medical): No   Lack of Transportation (Non-Medical): No  Physical Activity: Sufficiently Active   Days of Exercise per Week: 5 days   Minutes of Exercise per Session: 50 min  Stress: No Stress Concern Present   Feeling of Stress : Not at all  Social Connections: Moderately Integrated   Frequency of Communication with Friends and Family: More than three times a week   Frequency of Social Gatherings with Friends and Family: More than three times a week   Attends Religious Services: More than 4 times per year   Active Member of [employer] or Organizations: Yes   Attends Engineer, structural: More than 4 times per year   Marital Status: Divorced  Catering manager Violence: Not At Risk   Fear of Current or Ex-Partner: No   Emotionally Abused: No   Physically Abused: No   Sexually Abused: No    Review of Systems: See HPI, otherwise negative ROS  Physical  Exam: BP (!) 166/84   Pulse (!) 109   Temp 98 F (36.7 C) (Temporal)   Resp 18   Ht 5\' 4"  (1.626 m)   Wt 78 kg   SpO2 100%   BMI 29.52 kg/m  General:   Alert,  pleasant and cooperative in NAD Head:  Normocephalic and atraumatic. Neck:  Supple; no masses or thyromegaly. Lungs:  Clear throughout to auscultation.    Heart:  Regular rate and rhythm. Abdomen:  Soft, nontender and nondistended. Normal bowel sounds, without guarding, and without rebound.   Neurologic:  Alert and  oriented x4;  grossly normal neurologically.  Impression/Plan: Concepcion Elk is here for an endoscopy and colonoscopy to be performed for dysphagia and screening  Risks, benefits, limitations, and alternatives regarding  endoscopy and colonoscopy have been reviewed with the patient.  Questions have been answered.  All parties agreeable.   Midge Minium, MD  09/10/2021, 8:47 AM

## 2021-09-10 NOTE — Op Note (Signed)
[facility] ?Gastroenterology ?Patient Name: [name] ?Procedure Date: 09/10/2021 8:40 AM ?MRN: [id number] ?Account #: [id number] ?Date of Birth: 20-Dec-1955 ?Admit Type: Outpatient ?Age: 66 ?Room: [facility] ENDO ROOM 4 ?Gender: Female ?Note Status: Finalized ?Instrument Name: Colonscope [id number] ?Procedure:             Colonoscopy ?Indications:           Screening for colorectal malignant neoplasm ?Providers:             Lucilla Lame MD, MD ?Referring MD:          Olin Hauser (Referring MD) ?Medicines:             Propofol per Anesthesia ?Complications:         No immediate complications. ?Procedure:             Pre-Anesthesia Assessment: ?                       - Prior to the procedure, a History and Physical was  ?                       performed, and patient medications and allergies were  ?                       reviewed. The patient's tolerance of previous  ?                       anesthesia was also reviewed. The risks and benefits  ?                       of the procedure and the sedation options and risks  ?                       were discussed with the patient. All questions were  ?                       answered, and informed consent was obtained. Prior  ?                       Anticoagulants: The patient has taken Coumadin  ?                       (warfarin), last dose was 5 days prior to procedure.  ?                       ASA Grade Assessment: II - A patient with mild  ?                       systemic disease. After reviewing the risks and  ?                       benefits, the patient was deemed in satisfactory  ?                       condition to undergo the procedure. ?                       After obtaining informed consent, the colonoscope was  ?  passed under direct vision. Throughout the procedure,  ?                       the patient's blood pressure, pulse, and oxygen  ?                       saturations were monitored continuously. The  ?                        Colonoscope was introduced through the anus and  ?                       advanced to the the cecum, identified by appendiceal  ?                       orifice and ileocecal valve. The colonoscopy was  ?                       performed without difficulty. The patient tolerated  ?                       the procedure well. The quality of the bowel  ?                       preparation was adequate to identify polyps. ?Findings: ?     The perianal and digital rectal examinations were normal. ?     A 6 mm polyp was found in the ascending colon. The polyp was sessile.  ?     The polyp was removed with a cold snare. Resection and retrieval were  ?     complete. ?     Multiple small-mouthed diverticula were found in the sigmoid colon. ?     Non-bleeding internal hemorrhoids were found during retroflexion. The  ?     hemorrhoids were Grade I (internal hemorrhoids that do not prolapse). ?Impression:            - One 6 mm polyp in the ascending colon, removed with  ?                       a cold snare. Resected and retrieved. ?                       - Diverticulosis in the sigmoid colon. ?                       - Non-bleeding internal hemorrhoids. ?Recommendation:        - Discharge patient to home. ?                       - Resume previous diet. ?                       - Continue present medications. ?                       - Await pathology results. ?                       - If the pathology report reveals adenomatous tissue,  ?  then repeat the colonoscopy for surveillance in 7  ?                       years. ?Procedure Code(s):     --- Professional --- ?                       [phone], Colonoscopy, flexible; with removal of  ?                       tumor(s), polyp(s), or other lesion(s) by snare  ?                       technique ?Diagnosis Code(s):     --- Professional --- ?                       Z12.11, Encounter for screening for malignant neoplasm  ?                       of colon ?                        K63.5, Polyp of colon ?CPT copyright 2019 American Medical Association. All rights reserved. ?The codes documented in this report are preliminary and upon coder review may  ?be revised to meet current compliance requirements. ?Lucilla Lame MD, MD ?09/10/2021 9:19:01 AM ?This report has been signed electronically. ?Number of Addenda: 0 ?Note Initiated On: 09/10/2021 8:40 AM ?Scope Withdrawal Time: 0 hours 7 minutes 13 seconds  ?Total Procedure Duration: 0 hours 8 minutes 22 seconds  ?Estimated Blood Loss:  Estimated blood loss: none. ?     [facility] ?

## 2021-09-10 NOTE — Anesthesia Postprocedure Evaluation (Signed)
Anesthesia Post Note ? ?Patient: LAKELYNN SEVERTSON ? ?Procedure(s) Performed: COLONOSCOPY WITH PROPOFOL ?ESOPHAGOGASTRODUODENOSCOPY (EGD) WITH PROPOFOL ? ?Patient location during evaluation: Endoscopy ?Anesthesia Type: General ?Level of consciousness: awake and alert ?Pain management: pain level controlled ?Vital Signs Assessment: post-procedure vital signs reviewed and stable ?Respiratory status: spontaneous breathing, nonlabored ventilation and respiratory function stable ?Cardiovascular status: blood pressure returned to baseline and stable ?Postop Assessment: no apparent nausea or vomiting ?Anesthetic complications: no ? ? ?No notable events documented. ? ? ?Last Vitals:  ?Vitals:  ? 09/10/21 0930 09/10/21 0940  ?BP: 104/65 136/69  ?Pulse: (!) 53 81  ?Resp: (!) 23 (!) 24  ?Temp:    ?SpO2: 97% 98%  ?  ?Last Pain:  ?Vitals:  ? 09/10/21 0930  ?TempSrc:   ?PainSc: 0-No pain  ? ? ?  ?  ?  ?  ?  ?  ? ?[place] Ouch ? ? ? ? ?

## 2021-09-11 ENCOUNTER — Encounter: Payer: Self-pay | Admitting: Gastroenterology

## 2021-09-11 LAB — SURGICAL PATHOLOGY

## 2021-09-13 ENCOUNTER — Telehealth: Payer: Self-pay | Admitting: Cardiology

## 2021-09-13 ENCOUNTER — Encounter: Payer: Self-pay | Admitting: Gastroenterology

## 2021-09-13 DIAGNOSIS — Z5181 Encounter for therapeutic drug level monitoring: Secondary | ICD-10-CM

## 2021-09-13 MED ORDER — FUROSEMIDE 20 MG PO TABS: 20.0000 mg | ORAL_TABLET | Freq: Every day | ORAL | 3 refills | Status: DC

## 2021-09-13 NOTE — Telephone Encounter (Signed)
Pt c/o swelling: STAT is pt has developed SOB within 24 hours ? ?If swelling, where is the swelling located? feet ? ?How much weight have you gained and in what time span? Patient lost 1 lb ? ?Have you gained 3 pounds in a day or 5 pounds in a week? no ? ?Do you have a log of your daily weights (if so, list)?  ? 09/12/21:173 ? 09/13/21: 172 ? ?Are you currently taking a fluid pill?  ? ?Are you currently SOB? no ? ?Have you traveled recently? No ? ?Patient had a Colonoscopy, stretched esophagus Tuesday. The patient noticed some swelling today and she was not sure what to do. She just wanted to talk to [name] ?

## 2021-09-13 NOTE — Telephone Encounter (Signed)
Spoke with pt, Aware of dr crenshaw's recommendations.  Lab orders mailed to the pt  

## 2021-09-13 NOTE — Telephone Encounter (Signed)
Spoke to patient she stated she had a colonoscopy this past Tue 09/11/19.Stated she noticed on Wed 3/22 she has swelling in both feet,left is worse.No sob.Weight stable.She takes Lasix 20 mg every other day.She also has noticed when she lies down at night her heart will race for about 5 min.No chest pain.Advised I will send message to [facility] for advice. ?

## 2021-09-16 ENCOUNTER — Other Ambulatory Visit: Payer: Self-pay

## 2021-09-16 ENCOUNTER — Ambulatory Visit (INDEPENDENT_AMBULATORY_CARE_PROVIDER_SITE_OTHER): Payer: Medicare HMO

## 2021-09-16 DIAGNOSIS — I059 Rheumatic mitral valve disease, unspecified: Secondary | ICD-10-CM | POA: Diagnosis not present

## 2021-09-16 DIAGNOSIS — I4891 Unspecified atrial fibrillation: Secondary | ICD-10-CM | POA: Diagnosis not present

## 2021-09-16 DIAGNOSIS — I482 Chronic atrial fibrillation, unspecified: Secondary | ICD-10-CM | POA: Diagnosis not present

## 2021-09-16 DIAGNOSIS — Z9889 Other specified postprocedural states: Secondary | ICD-10-CM

## 2021-09-16 DIAGNOSIS — Z5181 Encounter for therapeutic drug level monitoring: Secondary | ICD-10-CM

## 2021-09-16 LAB — POCT INR: INR: 2.1 (ref 2.0–3.0)

## 2021-09-16 NOTE — Patient Instructions (Signed)
-   take 1.5 tablets today, then ?- continue taking warfarin 1/2 a tablet daily except for 1 tablet on Mondays & Fridays.  ?- Recheck INR next week in [place] ?

## 2021-09-20 DIAGNOSIS — E785 Hyperlipidemia, unspecified: Secondary | ICD-10-CM | POA: Diagnosis not present

## 2021-09-20 DIAGNOSIS — I4891 Unspecified atrial fibrillation: Secondary | ICD-10-CM

## 2021-09-20 DIAGNOSIS — I10 Essential (primary) hypertension: Secondary | ICD-10-CM | POA: Diagnosis not present

## 2021-09-24 DIAGNOSIS — Z5181 Encounter for therapeutic drug level monitoring: Secondary | ICD-10-CM | POA: Diagnosis not present

## 2021-09-24 LAB — BASIC METABOLIC PANEL
BUN/Creatinine Ratio: 16 (ref 12–28)
BUN: 13 mg/dL (ref 8–27)
CO2: 24 mmol/L (ref 20–29)
Calcium: 9.4 mg/dL (ref 8.7–10.3)
Chloride: 105 mmol/L (ref 96–106)
Creatinine, Ser: 0.82 mg/dL (ref 0.57–1.00)
Glucose: 93 mg/dL (ref 70–99)
Potassium: 5.1 mmol/L (ref 3.5–5.2)
Sodium: 142 mmol/L (ref 134–144)
eGFR: 78 mL/min/{1.73_m2} (ref >59)

## 2021-09-25 ENCOUNTER — Ambulatory Visit: Payer: Medicare HMO

## 2021-09-25 DIAGNOSIS — Z5181 Encounter for therapeutic drug level monitoring: Secondary | ICD-10-CM

## 2021-09-25 DIAGNOSIS — Z9889 Other specified postprocedural states: Secondary | ICD-10-CM

## 2021-09-25 DIAGNOSIS — I059 Rheumatic mitral valve disease, unspecified: Secondary | ICD-10-CM | POA: Diagnosis not present

## 2021-09-25 DIAGNOSIS — I482 Chronic atrial fibrillation, unspecified: Secondary | ICD-10-CM | POA: Diagnosis not present

## 2021-09-25 DIAGNOSIS — I4891 Unspecified atrial fibrillation: Secondary | ICD-10-CM

## 2021-09-25 LAB — POCT INR: INR: 1.5 — AB (ref 2.0–3.0)

## 2021-09-25 NOTE — Patient Instructions (Signed)
-   take 1 tablet today and tomorrow, then ?- continue taking warfarin 1/2 a tablet daily except for 1 tablet on Mondays & Fridays.  ?- Recheck INR next week in [place] ?

## 2021-10-02 ENCOUNTER — Ambulatory Visit: Payer: Medicare HMO

## 2021-10-02 ENCOUNTER — Other Ambulatory Visit: Payer: Self-pay | Admitting: Family Medicine

## 2021-10-02 DIAGNOSIS — Z7901 Long term (current) use of anticoagulants: Secondary | ICD-10-CM

## 2021-10-02 DIAGNOSIS — I4891 Unspecified atrial fibrillation: Secondary | ICD-10-CM

## 2021-10-02 DIAGNOSIS — I059 Rheumatic mitral valve disease, unspecified: Secondary | ICD-10-CM

## 2021-10-02 DIAGNOSIS — Z9889 Other specified postprocedural states: Secondary | ICD-10-CM

## 2021-10-02 DIAGNOSIS — Z5181 Encounter for therapeutic drug level monitoring: Secondary | ICD-10-CM

## 2021-10-02 DIAGNOSIS — M8589 Other specified disorders of bone density and structure, multiple sites: Secondary | ICD-10-CM

## 2021-10-02 DIAGNOSIS — I482 Chronic atrial fibrillation, unspecified: Secondary | ICD-10-CM

## 2021-10-02 LAB — POCT INR: INR: 3.6 — AB (ref 2.0–3.0)

## 2021-10-02 NOTE — Patient Instructions (Signed)
-   have a serving of greens today ?- continue taking warfarin 1/2 a tablet daily except for 1 tablet on Mondays & Fridays.  ?- Recheck INR in 2 weeks ?

## 2021-10-02 NOTE — Telephone Encounter (Signed)
Requested medication (s) are due for refill today: yes ? ?Requested medication (s) are on the active medication list: yes   ? ?Last refill: 07/05/21  #1  0 refills ? ?Future visit scheduled  with CM 11/04/21 ? ?Notes to clinic:Failed due to labs, please review. Thank you. ? ?Requested Prescriptions  ?Pending Prescriptions Disp Refills  ? alendronate (FOSAMAX) 35 MG tablet [Pharmacy Med Name: ALENDRONATE SODIUM 35 MG Tablet] 12 tablet 0  ?  Sig: TAKE 1 TABLET BY MOUTH EVERY 7 DAYS. TAKE WITH A FULL GLASS OF WATER ON AN EMPTY STOMACH.  ?  ? Endocrinology:  Bisphosphonates Failed - 10/02/2021 10:31 AM  ?  ?  Failed - Vitamin D in normal range and within 360 days  ?  Vit D, 25-Hydroxy  ?Date Value Ref Range Status  ?05/31/2019 37 30 - 100 ng/mL Final  ?  Comment:  ?  Vitamin D Status         25-OH Vitamin D: ?. ?Deficiency:                    <20 ng/mL ?Insufficiency:             20 - 29 ng/mL ?Optimal:                 > or = 30 ng/mL ?Marland Kitchen ?For 25-OH Vitamin D testing on patients on  ?D2-supplementation and patients for whom quantitation  ?of D2 and D3 fractions is required, the [facility](TM) ?25-OH VIT D, (D2,D3), LC/MS/MS is recommended: order  ?code 2340021802 (patients >70yr). ?See Note 1 ?. ?Note 1 ?. ?For additional information, please refer to  ?http://education.[facility].com/faq/FAQ199  ?(This link is being provided for informational/ ?educational purposes only.) ?  ?  ?  ?  ?  Failed - Mg Level in normal range and within 360 days  ?  Magnesium  ?Date Value Ref Range Status  ?08/24/2007 2.1  Final  ?  ?  ?  ?  Failed - Phosphate in normal range and within 360 days  ?  Phosphorus  ?Date Value Ref Range Status  ?08/24/2007 5.6 (H)  Final  ?  ?  ?  ?  Failed - Bone Mineral Density or Dexa Scan completed in the last 2 years  ?  ?  Passed - Ca in normal range and within 360 days  ?  Calcium  ?Date Value Ref Range Status  ?09/24/2021 9.4 8.7 - 10.3 mg/dL Final  ? ?Calcium, Ion  ?Date Value Ref Range Status   ?10/06/2008 1.16 1.12 - 1.32 mmol/L Final  ?  ?  ?  ?  Passed - Cr in normal range and within 360 days  ?  Creat  ?Date Value Ref Range Status  ?05/30/2021 0.91 0.50 - 1.05 mg/dL Final  ? ?Creatinine, Ser  ?Date Value Ref Range Status  ?09/24/2021 0.82 0.57 - 1.00 mg/dL Final  ? ?Creatinine, Urine  ?Date Value Ref Range Status  ?08/18/2007 145.6  Final  ?  ?  ?  ?  Passed - eGFR is 30 or above and within 360 days  ?  GFR, Est African American  ?Date Value Ref Range Status  ?11/28/2020 72 > OR = 60 mL/min/1.772mFinal  ? ?GFR, Est Non African American  ?Date Value Ref Range Status  ?11/28/2020 62 > OR = 60 mL/min/1.7369minal  ? ?GFR  ?Date Value Ref Range Status  ?10/06/2012 75.80 >60.00 mL/min Final  ? ?eGFR  ?Date Value Ref Range Status  ?  09/24/2021 78 >59 mL/min/1.73 Final  ?  ?  ?  ?  Passed - Valid encounter within last 12 months  ?  Recent Outpatient Visits   ? ?      ? 3 months ago Annual physical exam  ? [place], DO  ? 10 months ago GOITER, MULTINODULAR  ? [place], DO  ? 1 year ago Annual physical exam  ? [place], DO  ? 1 year ago Elevated hemoglobin A1c  ? [place], DO  ? 2 years ago Annual physical exam  ? [place], DO  ? ?  ?  ?Future Appointments   ? ?        ? In 8 months Parks Ranger, Devonne Doughty, DO [facility] [place]  ? ?  ? ?  ?  ?  ? ? ? ? ?

## 2021-10-15 ENCOUNTER — Other Ambulatory Visit: Payer: Self-pay | Admitting: Cardiology

## 2021-10-16 ENCOUNTER — Ambulatory Visit: Payer: Medicare HMO

## 2021-10-16 DIAGNOSIS — I059 Rheumatic mitral valve disease, unspecified: Secondary | ICD-10-CM

## 2021-10-16 DIAGNOSIS — Z5181 Encounter for therapeutic drug level monitoring: Secondary | ICD-10-CM | POA: Diagnosis not present

## 2021-10-16 DIAGNOSIS — I4891 Unspecified atrial fibrillation: Secondary | ICD-10-CM | POA: Diagnosis not present

## 2021-10-16 DIAGNOSIS — I482 Chronic atrial fibrillation, unspecified: Secondary | ICD-10-CM | POA: Diagnosis not present

## 2021-10-16 DIAGNOSIS — Z9889 Other specified postprocedural states: Secondary | ICD-10-CM

## 2021-10-16 LAB — POCT INR: INR: 2.1 (ref 2.0–3.0)

## 2021-10-16 NOTE — Patient Instructions (Signed)
-    TAKE 1 TABLET TODAY ONLY ?- continue taking warfarin 1/2 a tablet daily except for 1 tablet on Mondays & Fridays.  ?- Recheck INR in 2 weeks ?

## 2021-10-28 ENCOUNTER — Ambulatory Visit: Payer: Medicare HMO | Admitting: Endocrinology

## 2021-10-28 ENCOUNTER — Encounter: Payer: Self-pay | Admitting: Endocrinology

## 2021-10-28 ENCOUNTER — Telehealth: Payer: Medicare HMO

## 2021-10-28 VITALS — BP 156/84 | Ht 64.0 in | Wt 167.0 lb

## 2021-10-28 DIAGNOSIS — E042 Nontoxic multinodular goiter: Secondary | ICD-10-CM | POA: Diagnosis not present

## 2021-10-28 NOTE — Patient Instructions (Addendum)
Your blood pressure is high today.  Please see your primary care provider soon, to have it rechecked.   ?No medication is needed for the thyroid now.  ?most of the time, a "lumpy thyroid" will eventually become overactive again.  this is usually a slow process, happening over the span of many years.   ?You should have an endocrinology follow-up appointment in 1 year.   ?

## 2021-10-28 NOTE — Progress Notes (Signed)
? ?Subjective:  ? ? Patient ID: [name], female    DOB: January 14, 1956, 66 y.o.   MRN: [id number] ? ?HPI ?Pt returns for f/u of hyperthyroidism/MNG (dx'ed approx 2000.  she took an oral medication for this for a few years, but none since then.; F/u MNG 2022 was unchanged.   ?pt states she feels well in general.  Dysphagia is resolved with esoph dilation.   ?Past Medical History:  ?Diagnosis Date  ? Anticoagulated on warfarin   ? Arthritis   ? Heart murmur   ? History of cardiac catheterization   ? a. 2008 Cath: nl cors.  ? History of hyperthyroidism   ? a. 2003--  PTU [place]  ? Hypercholesterolemia   ? Hyperlipidemia   ? Multinodular goiter   ? Permanent atrial fibrillation ([place])   ? a. Dx 1997; b. CHA2DS2VASc = 3-->warfarin (s/p MVR as well); c. 06/2013 Holter: HRs freq in low 100's to 1-teens w/ max rate 179-->beta blocker increased.  ? Pulmonary hypertension, mild ([place])   ? Rheumatic heart disease   ? S/P MVR (mitral valve replacement)   ? a. 1997 s/p SJM mechanical MVR 2/2 Sev MS; b. chronic warfarin; c. 10/2017 Echo: EF 55-60%, no rwma, triv AI. Nl fxn of MV prosthesis. Sev dil LA. Mildly to mod increased PA pressures.  ? ? ?Past Surgical History:  ?Procedure Laterality Date  ? ANTERIOR CERVICAL DECOMP/DISCECTOMY FUSION  12-21-2001  ? C5 --  C6  ? BREAST BIOPSY Left 11/03/2016  ? Procedure: BREAST BIOPSY WITH NEEDLE LOCALIZATION;  Surgeon: Robert Bellow, MD;  Location: [facility] ORS;  Service: General;  Laterality: Left;  ? BREAST EXCISIONAL BIOPSY Left 2018  ? CARDIAC CATHETERIZATION  12-28-2006  DR [place]  ? NORMAL CORONARY ARTERIES  ? CARPAL TUNNEL RELEASE Right 01-20-2007  ? COLONOSCOPY  2015  ? COLONOSCOPY WITH PROPOFOL N/A 09/10/2021  ? Procedure: COLONOSCOPY WITH PROPOFOL;  Surgeon: Lucilla Lame, MD;  Location[facility] ENDOSCOPY;  Service: Endoscopy;  Laterality: N/A;  ? ERCP  08/15/2011  ? Procedure: ENDOSCOPIC RETROGRADE CHOLANGIOPANCREATOGRAPHY (ERCP);  Surgeon: Jeryl Columbia, MD;  Location: [facility] ENDOSCOPY;   Service: Endoscopy;  Laterality: N/A;  ? ERCP    ? MULTIPLE--  INCLUDING STENTS, SPHINTEROTOMY'S  STONE REMOVAL   ? ESOPHAGOGASTRODUODENOSCOPY (EGD) WITH PROPOFOL  09/10/2021  ? Procedure: ESOPHAGOGASTRODUODENOSCOPY (EGD) WITH PROPOFOL;  Surgeon: Lucilla Lame, MD;  Location: [facility] ENDOSCOPY;  Service: Endoscopy;;  ? FEMUR IM NAIL Right 04/26/2014  ? Procedure: INTRAMEDULLARY (IM) NAIL FEMORAL;  Surgeon: Alta Corning, MD;  Location: [place];  Service: Orthopedics;  Laterality: Right;  ? I & D EXTREMITY Right 02/07/2013  ? Procedure: IRRIGATION AND DEBRIDEMENT OF RIGHT LOWER LEG WITH PLACEMENT OF ACELL AND WOULND VAC;  Surgeon: Theodoro Kos, DO;  Location: [place];  Service: Plastics;  Laterality: Right;  ? LAPAROSCOPIC CHOLECYSTECTOMY  04-08-2002  ? MITRAL VALVE REPLACEMENT  1997  ? ST JUDE  ? ORIF ANKLE FRACTURE Right 11/05/2012  ? Procedure: OPEN REDUCTION INTERNAL FIXATION (ORIF) ANKLE FRACTURE only operating on right;  Surgeon: Alta Corning, MD;  Location: [place];  Service: Orthopedics;  Laterality: Right;  ? ORIF RIGHT ULNAR FX W/ ILIAC CREST BONE GRAFT  10-06-2008  ? ORIF WRIST FRACTURE Right 04/26/2014  ? Procedure: OPEN REDUCTION INTERNAL FIXATION (ORIF) WRIST FRACTURE;  Surgeon: Alta Corning, MD;  Location: [place];  Service: Orthopedics;  Laterality: Right;  ? TRANSTHORACIC ECHOCARDIOGRAM  06-29-2009  dr wall  ? normal lvsf/ ef  55-60%/  normal bileaflet mechanical mvr/ moderated dilated la/ moderate tr  ? WRIST ARTHROSCOPY Right 12-10-2007  ? debridement and ulnar shortening osteotomy w/ plate  ? ? ?Social History  ? ?Socioeconomic History  ? Marital status: Divorced  ?  Spouse name: Not on file  ? Number of children: 0  ? Years of education: 77  ? Highest education level: 12th grade  ?Occupational History  ? Occupation: Control and instrumentation engineer  ?  Comment: retired  ?Tobacco Use  ? Smoking status: Never  ? Smokeless tobacco: Never  ?Vaping Use  ? Vaping Use: Never used  ?Substance and Sexual Activity   ? Alcohol use: No  ? Drug use: No  ? Sexual activity: Not on file  ?Other Topics Concern  ? Not on file  ?Social History Narrative  ? Not on file  ? ?Social Determinants of Health  ? ?Financial Resource Strain: Low Risk   ? Difficulty of Paying Living Expenses: Not hard at all  ?Food Insecurity: No Food Insecurity  ? Worried About Charity fundraiser in the Last Year: Never true  ? Ran Out of Food in the Last Year: Never true  ?Transportation Needs: No Transportation Needs  ? Lack of Transportation (Medical): No  ? Lack of Transportation (Non-Medical): No  ?Physical Activity: Sufficiently Active  ? Days of Exercise per Week: 5 days  ? Minutes of Exercise per Session: 50 min  ?Stress: No Stress Concern Present  ? Feeling of Stress : Not at all  ?Social Connections: Moderately Integrated  ? Frequency of Communication with Friends and Family: More than three times a week  ? Frequency of Social Gatherings with Friends and Family: More than three times a week  ? Attends Religious Services: More than 4 times per year  ? Active Member of Clubs or Organizations: Yes  ? Attends Archivist Meetings: More than 4 times per year  ? Marital Status: Divorced  ?Intimate Partner Violence: Not At Risk  ? Fear of Current or Ex-Partner: No  ? Emotionally Abused: No  ? Physically Abused: No  ? Sexually Abused: No  ? ? ?Current Outpatient Medications on File Prior to Visit  ?Medication Sig Dispense Refill  ? acyclovir (ZOVIRAX) 400 MG tablet TAKE 1 TABLET BY MOUTH 3 TIMES DAILY FOR 5 TO 10 DAYS OR UNTIL HEALED FOR FEVER BLISTER. 30 tablet 1  ? alendronate (FOSAMAX) 35 MG tablet TAKE 1 TABLET BY MOUTH EVERY 7 DAYS. TAKE WITH A FULL GLASS OF WATER ON AN EMPTY STOMACH. 12 tablet 2  ? amitriptyline (ELAVIL) 50 MG tablet Take 50 mg by mouth at bedtime as needed for sleep.     ? aspirin EC 81 MG tablet Take 1 tablet (81 mg total) by mouth daily. 90 tablet 3  ? atorvastatin (LIPITOR) 40 MG tablet TAKE 1 TABLET BY MOUTH ONCE EVERY  EVENING 90 tablet 3  ? calcium carbonate (OSCAL) 1500 (600 Ca) MG TABS tablet Take 1 tablet by mouth daily.     ? cetirizine (ZYRTEC) 10 MG tablet TAKE 1 TABLET BY MOUTH ONCE A DAY 90 tablet 3  ? Cholecalciferol (VITAMIN D3) 2000 units capsule Take 1 capsule (2,000 Units total) by mouth daily.    ? dicyclomine (BENTYL) 10 MG capsule TAKE 1 CAPSULE BY MOUTH 4 TIMES A DAY BEFORE MEALS AND AT BEDTIME. 90 capsule 1  ? fluticasone (FLONASE) 50 MCG/ACT nasal spray PLACE 2 SPRAYS INTO BOTH NOSTRILS DAILY 16 g 11  ? furosemide (LASIX) 20 MG  tablet Take 1 tablet (20 mg total) by mouth daily. 45 tablet 3  ? lisinopril (ZESTRIL) 40 MG tablet Take 1 tablet (40 mg total) by mouth daily. 90 tablet 3  ? methocarbamol (ROBAXIN) 500 MG tablet Take 1 tablet (500 mg total) by mouth 4 (four) times daily as needed for muscle spasms (pain). 30 tablet 3  ? metoprolol succinate (TOPROL-XL) 100 MG 24 hr tablet TAKE 1.5 TABLETS BY MOUTH DAILY. TAKE WITH OR IMMEDIATELY FOLLOWING A MEAL 135 tablet 3  ? Multiple Vitamins-Minerals (CENTRUM SILVER ADULT 50+ PO) Take 1 tablet by mouth daily.    ? triamcinolone cream (KENALOG) 0.5 % Apply 1 application topically 2 (two) times daily. To affected areas, for up to 2 weeks. 30 g 0  ? warfarin (COUMADIN) 5 MG tablet Take 1/2 to 1 tablet by mouth daily as directed by coumadin clinic 90 tablet 1  ? enoxaparin (LOVENOX) 80 MG/0.8ML injection Inject 0.8 mLs (80 mg total) into the skin every 12 (twelve) hours for 10 days. 16 mL 0  ? ?No current facility-administered medications on file prior to visit.  ? ? ?No Known Allergies ? ?Family History  ?Problem Relation Age of Onset  ? Hypothyroidism Sister   ? Gallbladder disease Sister   ? Coronary artery disease Other   ? Diabetes Mother   ? Hypertension Mother   ? Heart disease Father   ? Stroke Father   ? Breast cancer Neg Hx   ? ? ?BP (!) 156/84   Ht '5\' 4"'$  (1.626 m)   Wt 167 lb (75.8 kg)   BMI 28.67 kg/m?  ? ? ?Review of Systems ? ?   ?Objective:  ?  Physical Exam ?VITAL SIGNS:  See vs page ?GENERAL: no distress ?NECK: There is no palpable thyroid enlargement.  No thyroid nodule is palpable.  No palpable lymphadenopathy at the anterior neck.  ? ?Lab Results  ?C

## 2021-10-30 ENCOUNTER — Ambulatory Visit: Payer: Medicare HMO

## 2021-10-30 DIAGNOSIS — Z5181 Encounter for therapeutic drug level monitoring: Secondary | ICD-10-CM

## 2021-10-30 DIAGNOSIS — Z9889 Other specified postprocedural states: Secondary | ICD-10-CM

## 2021-10-30 DIAGNOSIS — I482 Chronic atrial fibrillation, unspecified: Secondary | ICD-10-CM | POA: Diagnosis not present

## 2021-10-30 DIAGNOSIS — I4891 Unspecified atrial fibrillation: Secondary | ICD-10-CM

## 2021-10-30 DIAGNOSIS — I059 Rheumatic mitral valve disease, unspecified: Secondary | ICD-10-CM | POA: Diagnosis not present

## 2021-10-30 LAB — POCT INR: INR: 2.1 (ref 2.0–3.0)

## 2021-10-30 NOTE — Patient Instructions (Signed)
-   Increase warfarin to 1/2 a tablet daily except for 1 tablet on Mondays, Wednesdays and Fridays.  ?- Recheck INR in 3 weeks ?

## 2021-11-04 ENCOUNTER — Telehealth: Payer: Self-pay

## 2021-11-04 ENCOUNTER — Ambulatory Visit (INDEPENDENT_AMBULATORY_CARE_PROVIDER_SITE_OTHER): Payer: Medicare HMO

## 2021-11-04 ENCOUNTER — Telehealth: Payer: Medicare HMO

## 2021-11-04 DIAGNOSIS — E7800 Pure hypercholesterolemia, unspecified: Secondary | ICD-10-CM

## 2021-11-04 DIAGNOSIS — I10 Essential (primary) hypertension: Secondary | ICD-10-CM

## 2021-11-04 DIAGNOSIS — R1310 Dysphagia, unspecified: Secondary | ICD-10-CM

## 2021-11-04 DIAGNOSIS — I482 Chronic atrial fibrillation, unspecified: Secondary | ICD-10-CM

## 2021-11-04 DIAGNOSIS — R1319 Other dysphagia: Secondary | ICD-10-CM

## 2021-11-04 NOTE — Telephone Encounter (Signed)
?  Care Management  ? ?Follow Up Note ? ? ?11/04/2021 ?Name: [name] MRN: [id number] DOB: 10-Dec-1955 ? ? ?Referred by: Olin Hauser, DO ?Reason for referral : Chronic Care Management (RNCM: Follow up for Chronic Disease Management and Care Coordination Needs ) ? ? ?The patient returned call to the [facility] and call was completed. See new encounter.  ? ?Follow Up Plan: Telephone follow up appointment with care management team member scheduled for: 01-09-2022 at 0945 am ? ?Noreene Larsson RN, MSN, CCM ?Community Care Coordinator ?[facility] ?[facility] ?Mobile: [phone]  ?

## 2021-11-04 NOTE — Chronic Care Management (AMB) (Signed)
?Chronic Care Management  ? ?CCM RN Visit Note ? ?11/04/2021 ?Name: [name] MRN: [id number] DOB: [birth year] ? ?Subjective: ?[name] is a 66 y.o. year old female who is a primary care patient of Olin Hauser, DO. The care management team was consulted for assistance with disease management and care coordination needs.   ? ?Engaged with patient by telephone for follow up visit in response to provider referral for case management and/or care coordination services.  ? ?Consent to Services:  ?The patient was given information about Chronic Care Management services, agreed to services, and gave verbal consent prior to initiation of services.  Please see initial visit note for detailed documentation.  ? ?Patient agreed to services and verbal consent obtained.  ? ?Assessment: Review of patient past medical history, allergies, medications, health status, including review of consultants reports, laboratory and other test data, was performed as part of comprehensive evaluation and provision of chronic care management services.  ? ?SDOH (Social Determinants of Health) assessments and interventions performed:   ? ?CCM Care Plan ? ?No Known Allergies ? ?Outpatient Encounter Medications as of 11/04/2021  ?Medication Sig  ? acyclovir (ZOVIRAX) 400 MG tablet TAKE 1 TABLET BY MOUTH 3 TIMES DAILY FOR 5 TO 10 DAYS OR UNTIL HEALED FOR FEVER BLISTER.  ? alendronate (FOSAMAX) 35 MG tablet TAKE 1 TABLET BY MOUTH EVERY 7 DAYS. TAKE WITH A FULL GLASS OF WATER ON AN EMPTY STOMACH.  ? amitriptyline (ELAVIL) 50 MG tablet Take 50 mg by mouth at bedtime as needed for sleep.   ? aspirin EC 81 MG tablet Take 1 tablet (81 mg total) by mouth daily.  ? atorvastatin (LIPITOR) 40 MG tablet TAKE 1 TABLET BY MOUTH ONCE EVERY EVENING  ? calcium carbonate (OSCAL) 1500 (600 Ca) MG TABS tablet Take 1 tablet by mouth daily.   ? cetirizine (ZYRTEC) 10 MG tablet TAKE 1 TABLET BY MOUTH ONCE A DAY  ? Cholecalciferol (VITAMIN D3) 2000  units capsule Take 1 capsule (2,000 Units total) by mouth daily.  ? dicyclomine (BENTYL) 10 MG capsule TAKE 1 CAPSULE BY MOUTH 4 TIMES A DAY BEFORE MEALS AND AT BEDTIME.  ? enoxaparin (LOVENOX) 80 MG/0.8ML injection Inject 0.8 mLs (80 mg total) into the skin every 12 (twelve) hours for 10 days.  ? fluticasone (FLONASE) 50 MCG/ACT nasal spray PLACE 2 SPRAYS INTO BOTH NOSTRILS DAILY  ? furosemide (LASIX) 20 MG tablet Take 1 tablet (20 mg total) by mouth daily.  ? lisinopril (ZESTRIL) 40 MG tablet Take 1 tablet (40 mg total) by mouth daily.  ? methocarbamol (ROBAXIN) 500 MG tablet Take 1 tablet (500 mg total) by mouth 4 (four) times daily as needed for muscle spasms (pain).  ? metoprolol succinate (TOPROL-XL) 100 MG 24 hr tablet TAKE 1.5 TABLETS BY MOUTH DAILY. TAKE WITH OR IMMEDIATELY FOLLOWING A MEAL  ? Multiple Vitamins-Minerals (CENTRUM SILVER ADULT 50+ PO) Take 1 tablet by mouth daily.  ? triamcinolone cream (KENALOG) 0.5 % Apply 1 application topically 2 (two) times daily. To affected areas, for up to 2 weeks.  ? warfarin (COUMADIN) 5 MG tablet Take 1/2 to 1 tablet by mouth daily as directed by coumadin clinic  ? ?No facility-administered encounter medications on file as of 11/04/2021.  ? ? ?Patient Active Problem List  ? Diagnosis Date Noted  ? Special screening for malignant neoplasms, colon   ? Polyp of ascending colon   ? Dysphagia 04/18/2021  ? Obesity (BMI 30.0-34.9) 06/08/2018  ? Elevated hemoglobin A1c  06/18/2017  ? Allergic rhinitis 12/02/2016  ? Vitamin D deficiency 12/02/2016  ? S/P MVR (mitral valve replacement) 06/03/2016  ? Osteopenia 06/03/2016  ? Environmental and seasonal allergies 06/03/2016  ? History of epigastric pain 03/13/2016  ? Breast microcalcifications 10/15/2015  ? Piriformis syndrome 10/02/2015  ? History of femur fracture 04/26/2014  ? Encounter for therapeutic drug monitoring 07/19/2013  ? Choledocholithiasis 08/14/2011  ? Anticoagulated on Coumadin 08/14/2011  ? Mitral valve disease  08/15/2010  ? MITRAL VALVE REPLACEMENT, HX OF 04/24/2009  ? History of hyperthyroidism 04/19/2009  ? Essential hypertension 10/17/2008  ? Pulmonary hypertension ([place]) 10/17/2008  ? RHEUMATIC HEART DISEASE, HX OF 10/17/2008  ? GOITER, MULTINODULAR 10/12/2007  ? HYPERCHOLESTEROLEMIA 10/12/2007  ? Chronic atrial fibrillation ([place]) 10/12/2007  ? GERD 10/12/2007  ? ? ?Conditions to be addressed/monitored:Atrial Fibrillation, HTN, HLD, and Dysphagia  ? ?Care Plan : RNCM: General Plan of Care (Adult) for Chronic Disease Management and Care Coordination Needs  ?Updates made by [name], RN since 11/04/2021 12:00 AM  ?  ? ?Problem: RNCM: Development of Plan of care for Chronic Disease Management (AFIB, HTN, HLD, Dysphagia)   ?Priority: High  ?  ? ?Long-Range Goal: RNCM: Effective Management of Plan of care for Chronic Disease Management (AFIB, HTN, HLD, Dysphagia)   ?Start Date: 05/06/2021  ?Expected End Date: 05/06/2022  ?Priority: High  ?Note:   ?Current Barriers:  ?Knowledge Deficits related to plan of care for management of Atrial Fibrillation, HTN, HLD, and dysphagia  ?Chronic Disease Management support and education needs related to Atrial Fibrillation, HTN, HLD, and dysphagia ? ?RNCM Clinical Goal(s):  ?Patient will verbalize understanding of plan for management of Atrial Fibrillation, HTN, HLD, and dysphagia as evidenced by following the plan of care, taking medications as directed, keeping follow up appointments and calling the office for changes in conditions  ?take all medications exactly as prescribed and will call provider for medication related questions as evidenced by compliance with medications    ?attend all scheduled medical appointments: 06-10-2022 with pcp, knows to call sooner if needs arise. Had a colonoscopy and EGD recently in March, sees cardiology on a regular basis as evidenced by keeping appointments and calling for rescheduling needs         ?demonstrate improved and ongoing adherence to  prescribed treatment plan for Atrial Fibrillation, HTN, HLD, and dysphagia as evidenced by seeing the providers as needed, and going to appointments coming up ?demonstrate a decrease in Atrial Fibrillation, HTN, HLD, and dsyphagia exacerbations  as evidenced by working with the CCM team to effectively manage health and well being ?demonstrate ongoing self health care management ability for effective management of chronic conditions as evidenced by  working with the CCM team through collaboration with Consulting civil engineer, provider, and care team.  ? ?Interventions: ?1:1 collaboration with primary care provider regarding development and update of comprehensive plan of care as evidenced by provider attestation and co-signature ?Inter-disciplinary care team collaboration (see longitudinal plan of care) ?Evaluation of current treatment plan related to  self management and patient's adherence to plan as established by provider ? ? ?SDOH Barriers (Status: Goal on Track (progressing): YES.) Long Term Goal  ?Patient interviewed and SDOH assessment performed ?       ?Patient interviewed and appropriate assessments performed ?Provided patient with information about resources available and care guides to assist with changes in SDOH or new needs  ?Discussed plans with patient for ongoing care management follow up and provided patient with direct contact information  for care management team ?Advised patient to call the office for changes in [place], new needs or concerns ? ? ? ?A-fib:  (Status: Goal on Track (progressing): YES.) Long Term Goal  ?Counseled on increased risk of stroke due to Afib and benefits of anticoagulation for stroke prevention           ?Reviewed importance of adherence to anticoagulant exactly as prescribed. 07-08-2021: Is adherent with lab testing and anticoagulant therapy. Had been having to have more frequent blood work done but may be able to go longer as the levels have been more stable the last 2 times.  09-02-2021: The patient is having more stable levels. Has regular lab work when needed. 11-04-2021: The patient gets labs on a consistent basis. Labs for PT/INR have stabilized out. She denies any new concerns wi
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
## 2021-11-18 ENCOUNTER — Other Ambulatory Visit: Payer: Self-pay | Admitting: Cardiology

## 2021-11-18 DIAGNOSIS — I482 Chronic atrial fibrillation, unspecified: Secondary | ICD-10-CM

## 2021-11-19 NOTE — Telephone Encounter (Signed)
Prescription refill request received for warfarin Lov: 07/24/21 [facility])  Next INR check: 11/20/21 Warfarin tablet strength: '5mg'$   Appropriate dose and refill sent to requested pharmacy.

## 2021-11-20 ENCOUNTER — Ambulatory Visit: Payer: Medicare HMO

## 2021-11-20 DIAGNOSIS — E785 Hyperlipidemia, unspecified: Secondary | ICD-10-CM | POA: Diagnosis not present

## 2021-11-20 DIAGNOSIS — Z9889 Other specified postprocedural states: Secondary | ICD-10-CM

## 2021-11-20 DIAGNOSIS — I059 Rheumatic mitral valve disease, unspecified: Secondary | ICD-10-CM

## 2021-11-20 DIAGNOSIS — I4891 Unspecified atrial fibrillation: Secondary | ICD-10-CM | POA: Diagnosis not present

## 2021-11-20 DIAGNOSIS — I10 Essential (primary) hypertension: Secondary | ICD-10-CM | POA: Diagnosis not present

## 2021-11-20 DIAGNOSIS — I482 Chronic atrial fibrillation, unspecified: Secondary | ICD-10-CM

## 2021-11-20 DIAGNOSIS — Z5181 Encounter for therapeutic drug level monitoring: Secondary | ICD-10-CM | POA: Diagnosis not present

## 2021-11-20 LAB — POCT INR: INR: 2.4 (ref 2.0–3.0)

## 2021-11-20 NOTE — Patient Instructions (Signed)
-  TAKE 2 TABLETS TODAY ONLY - Continue 1/2 a tablet daily except for 1 tablet on Mondays, Wednesdays and Fridays.  - Recheck INR in 2 weeks

## 2021-12-04 ENCOUNTER — Ambulatory Visit: Payer: Medicare HMO

## 2021-12-04 DIAGNOSIS — Z5181 Encounter for therapeutic drug level monitoring: Secondary | ICD-10-CM | POA: Diagnosis not present

## 2021-12-04 DIAGNOSIS — Z9889 Other specified postprocedural states: Secondary | ICD-10-CM

## 2021-12-04 DIAGNOSIS — I059 Rheumatic mitral valve disease, unspecified: Secondary | ICD-10-CM

## 2021-12-04 DIAGNOSIS — I4891 Unspecified atrial fibrillation: Secondary | ICD-10-CM

## 2021-12-04 DIAGNOSIS — I482 Chronic atrial fibrillation, unspecified: Secondary | ICD-10-CM | POA: Diagnosis not present

## 2021-12-04 LAB — POCT INR: INR: 5 — AB (ref 2.0–3.0)

## 2021-12-04 NOTE — Patient Instructions (Signed)
Description   Skip today and tomorrow's dosage of Warfarin, then resume same dosage 1/2 a tablet daily except for 1 tablet on Mondays, Wednesdays and Fridays.  - Recheck INR in 1 week

## 2021-12-11 ENCOUNTER — Ambulatory Visit: Payer: Medicare HMO

## 2021-12-11 DIAGNOSIS — I4891 Unspecified atrial fibrillation: Secondary | ICD-10-CM | POA: Diagnosis not present

## 2021-12-11 DIAGNOSIS — I482 Chronic atrial fibrillation, unspecified: Secondary | ICD-10-CM

## 2021-12-11 DIAGNOSIS — Z5181 Encounter for therapeutic drug level monitoring: Secondary | ICD-10-CM

## 2021-12-11 DIAGNOSIS — Z9889 Other specified postprocedural states: Secondary | ICD-10-CM | POA: Diagnosis not present

## 2021-12-11 DIAGNOSIS — I059 Rheumatic mitral valve disease, unspecified: Secondary | ICD-10-CM | POA: Diagnosis not present

## 2021-12-11 LAB — POCT INR: INR: 2.4 (ref 2.0–3.0)

## 2021-12-11 NOTE — Patient Instructions (Signed)
TAKE 1.5 TABLETS TODAY ONLY and then resume same dosage 1/2 a tablet daily except for 1 tablet on Mondays, Wednesdays and Fridays.  - Recheck INR in 4 weeks

## 2021-12-27 ENCOUNTER — Ambulatory Visit (INDEPENDENT_AMBULATORY_CARE_PROVIDER_SITE_OTHER): Payer: Medicare HMO

## 2021-12-27 VITALS — Wt 167.0 lb

## 2021-12-27 DIAGNOSIS — Z1231 Encounter for screening mammogram for malignant neoplasm of breast: Secondary | ICD-10-CM

## 2021-12-27 DIAGNOSIS — Z0000 Encounter for general adult medical examination without abnormal findings: Secondary | ICD-10-CM

## 2021-12-27 DIAGNOSIS — Z780 Asymptomatic menopausal state: Secondary | ICD-10-CM | POA: Diagnosis not present

## 2021-12-27 NOTE — Progress Notes (Signed)
Virtual Visit via Telephone Note  I connected with  [name] on 12/27/21 at  3:00 PM EDT by telephone and verified that I am speaking with the correct person using two identifiers.  Location: Patient: home Provider: [facility] Persons participating in the virtual visit: [place]   I discussed the limitations, risks, security and privacy concerns of performing an evaluation and management service by telephone and the availability of in person appointments. The patient expressed understanding and agreed to proceed.  Interactive audio and video telecommunications were attempted between this nurse and patient, however failed, due to patient having technical difficulties OR patient did not have access to video capability.  We continued and completed visit with audio only.  Some vital signs may be absent or patient reported.   Dionisio David, LPN  Subjective:   [name] is a 66 y.o. female who presents for Medicare Annual (Subsequent) preventive examination.  Review of Systems           Objective:    There were no vitals filed for this visit. There is no height or weight on file to calculate BMI.     09/10/2021    8:17 AM 06/12/2020    9:17 AM 11/07/2019   12:21 PM 06/07/2019    9:21 AM 06/01/2018    8:29 AM 08/17/2017    5:10 PM 05/26/2017    8:53 AM  Advanced Directives  Does Patient Have a Medical Advance Directive? No No No No No No Yes  Does patient want to make changes to medical advance directive?       Yes (MAU/Ambulatory/Procedural Areas - Information given)  Would patient like information on creating a medical advance directive? No - Patient declined  Yes (MAU/Ambulatory/Procedural Areas - Information given)  No - Patient declined No - Patient declined     Current Medications (verified) Outpatient Encounter Medications as of 12/27/2021  Medication Sig   acyclovir (ZOVIRAX) 400 MG tablet TAKE 1 TABLET BY MOUTH 3 TIMES DAILY FOR 5 TO 10  DAYS OR UNTIL HEALED FOR FEVER BLISTER.   alendronate (FOSAMAX) 35 MG tablet TAKE 1 TABLET BY MOUTH EVERY 7 DAYS. TAKE WITH A FULL GLASS OF WATER ON AN EMPTY STOMACH.   amitriptyline (ELAVIL) 50 MG tablet Take 50 mg by mouth at bedtime as needed for sleep.    aspirin EC 81 MG tablet Take 1 tablet (81 mg total) by mouth daily.   atorvastatin (LIPITOR) 40 MG tablet TAKE 1 TABLET BY MOUTH ONCE EVERY EVENING   calcium carbonate (OSCAL) 1500 (600 Ca) MG TABS tablet Take 1 tablet by mouth daily.    cetirizine (ZYRTEC) 10 MG tablet TAKE 1 TABLET BY MOUTH ONCE A DAY   Cholecalciferol (VITAMIN D3) 2000 units capsule Take 1 capsule (2,000 Units total) by mouth daily.   dicyclomine (BENTYL) 10 MG capsule TAKE 1 CAPSULE BY MOUTH 4 TIMES A DAY BEFORE MEALS AND AT BEDTIME.   fluticasone (FLONASE) 50 MCG/ACT nasal spray PLACE 2 SPRAYS INTO BOTH NOSTRILS DAILY   furosemide (LASIX) 20 MG tablet Take 1 tablet (20 mg total) by mouth daily.   lisinopril (ZESTRIL) 40 MG tablet Take 1 tablet (40 mg total) by mouth daily.   methocarbamol (ROBAXIN) 500 MG tablet Take 1 tablet (500 mg total) by mouth 4 (four) times daily as needed for muscle spasms (pain).   metoprolol succinate (TOPROL-XL) 100 MG 24 hr tablet TAKE 1.5 TABLETS BY MOUTH DAILY. TAKE WITH OR IMMEDIATELY FOLLOWING A MEAL  Multiple Vitamins-Minerals (CENTRUM SILVER ADULT 50+ PO) Take 1 tablet by mouth daily.   triamcinolone cream (KENALOG) 0.5 % Apply 1 application topically 2 (two) times daily. To affected areas, for up to 2 weeks.   warfarin (COUMADIN) 5 MG tablet TAKE 1/2 TO 1 TABLET BY MOUTH DAILY AS DIRECTED BY COUMADIN CLINIC   enoxaparin (LOVENOX) 80 MG/0.8ML injection Inject 0.8 mLs (80 mg total) into the skin every 12 (twelve) hours for 10 days.   No facility-administered encounter medications on file as of 12/27/2021.    Allergies (verified) Patient has no known allergies.   History: Past Medical History:  Diagnosis Date   Anticoagulated on  warfarin    Arthritis    Heart murmur    History of cardiac catheterization    a. 2008 Cath: nl cors.   History of hyperthyroidism    a. 2003--  PTU [place]   Hypercholesterolemia    Hyperlipidemia    Multinodular goiter    Permanent atrial fibrillation ([place])    a. Dx 1997; b. CHA2DS2VASc = 3-->warfarin (s/p MVR as well); c. 06/2013 Holter: HRs freq in low 100's to 1-teens w/ max rate 179-->beta blocker increased.   Pulmonary hypertension, mild (HCC)    Rheumatic heart disease    S/P MVR (mitral valve replacement)    a. 1997 s/p SJM mechanical MVR 2/2 Sev MS; b. chronic warfarin; c. 10/2017 Echo: EF 55-60%, no rwma, triv AI. Nl fxn of MV prosthesis. Sev dil LA. Mildly to mod increased PA pressures.   Past Surgical History:  Procedure Laterality Date   ANTERIOR CERVICAL DECOMP/DISCECTOMY FUSION  12-21-2001   C5 --  C6   BREAST BIOPSY Left 11/03/2016   Procedure: BREAST BIOPSY WITH NEEDLE LOCALIZATION;  Surgeon: Robert Bellow, MD;  Location: [facility] ORS;  Service: General;  Laterality: Left;   BREAST EXCISIONAL BIOPSY Left 2018   CARDIAC CATHETERIZATION  12-28-2006  [facility]   NORMAL CORONARY ARTERIES   CARPAL TUNNEL RELEASE Right 01-20-2007   COLONOSCOPY  2015   COLONOSCOPY WITH PROPOFOL N/A 09/10/2021   Procedure: COLONOSCOPY WITH PROPOFOL;  Surgeon: Lucilla Lame, MD;  Location: [facility] ENDOSCOPY;  Service: Endoscopy;  Laterality: N/A;   ERCP  08/15/2011   Procedure: ENDOSCOPIC RETROGRADE CHOLANGIOPANCREATOGRAPHY (ERCP);  Surgeon: Jeryl Columbia, MD;  Location: [facility] ENDOSCOPY;  Service: Endoscopy;  Laterality: N/A;   ERCP     MULTIPLE--  INCLUDING STENTS, SPHINTEROTOMY'S  STONE REMOVAL    ESOPHAGOGASTRODUODENOSCOPY (EGD) WITH PROPOFOL  09/10/2021   Procedure: ESOPHAGOGASTRODUODENOSCOPY (EGD) WITH PROPOFOL;  Surgeon: Lucilla Lame, MD;  Location: [facility] ENDOSCOPY;  Service: Endoscopy;;   FEMUR IM NAIL Right 04/26/2014   Procedure: INTRAMEDULLARY (IM) NAIL FEMORAL;  Surgeon: Alta Corning, MD;   Location: [place];  Service: Orthopedics;  Laterality: Right;   I & D EXTREMITY Right 02/07/2013   Procedure: IRRIGATION AND DEBRIDEMENT OF RIGHT LOWER LEG WITH PLACEMENT OF ACELL AND WOULND VAC;  Surgeon: Theodoro Kos, DO;  Location: [place];  Service: Plastics;  Laterality: Right;   LAPAROSCOPIC CHOLECYSTECTOMY  04-08-2002   MITRAL VALVE REPLACEMENT  1997   [facility]   ORIF ANKLE FRACTURE Right 11/05/2012   Procedure: OPEN REDUCTION INTERNAL FIXATION (ORIF) ANKLE FRACTURE only operating on right;  Surgeon: Alta Corning, MD;  Location: [place];  Service: Orthopedics;  Laterality: Right;   ORIF RIGHT ULNAR FX W/ ILIAC CREST BONE GRAFT  10-06-2008   ORIF WRIST FRACTURE Right 04/26/2014   Procedure: OPEN REDUCTION INTERNAL FIXATION (ORIF) WRIST  FRACTURE;  Surgeon: Alta Corning, MD;  Location: [place];  Service: Orthopedics;  Laterality: Right;   TRANSTHORACIC ECHOCARDIOGRAM  06-29-2009  dr wall   normal lvsf/ ef 55-60%/  normal bileaflet mechanical mvr/ moderated dilated la/ moderate tr   WRIST ARTHROSCOPY Right 12-10-2007   debridement and ulnar shortening osteotomy w/ plate   Family History  Problem Relation Age of Onset   Hypothyroidism Sister    Gallbladder disease Sister    Coronary artery disease Other    Diabetes Mother    Hypertension Mother    Heart disease Father    Stroke Father    Breast cancer Neg Hx    Social History   Socioeconomic History   Marital status: Divorced    Spouse name: Not on file   Number of children: 0   Years of education: 12   Highest education level: 12th grade  Occupational History   Occupation: Control and instrumentation engineer    Comment: retired  Tobacco Use   Smoking status: Never   Smokeless tobacco: Never  Scientific laboratory technician Use: Never used  Substance and Sexual Activity   Alcohol use: No   Drug use: No   Sexual activity: Not on file  Other Topics Concern   Not on file  Social History Narrative   Not on file   Social Determinants  of Health   Financial Resource Strain: Low Risk  (03/11/2021)   Overall Financial Resource Strain (CARDIA)    Difficulty of Paying Living Expenses: Not hard at all  Food Insecurity: No Food Insecurity (03/11/2021)   Hunger Vital Sign    Worried About Running Out of Food in the Last Year: Never true    [place] in the Last Year: Never true  Transportation Needs: No Transportation Needs (03/11/2021)   PRAPARE - Hydrologist (Medical): No    Lack of Transportation (Non-Medical): No  Physical Activity: Sufficiently Active (12/27/2021)   Exercise Vital Sign    Days of Exercise per Week: 4 days    Minutes of Exercise per Session: 40 min  Stress: No Stress Concern Present (12/27/2021)   [place]    Feeling of Stress : Only a little  Social Connections: Moderately Integrated (03/11/2021)   Social Connection and Isolation Panel [NHANES]    Frequency of Communication with Friends and Family: More than three times a week    Frequency of Social Gatherings with Friends and Family: More than three times a week    Attends Religious Services: More than 4 times per year    Active Member of [employer] or Organizations: Yes    Attends Music therapist: More than 4 times per year    Marital Status: Divorced    Tobacco Counseling Counseling given: Not Answered   Clinical Intake:  Pre-visit preparation completed: Yes        Nutritional Risks: None Diabetes: No  How often do you need to have someone help you when you read instructions, pamphlets, or other written materials from your doctor or pharmacy?: 1 - Never  Diabetic?no  Interpreter Needed?: No  Information entered by :: Kirke Shaggy, LPN   Activities of Daily Living     No data to display          Patient Care Team: Olin Hauser, DO as PCP - General (Family Medicine) Stanford Breed Denice Bors, MD as PCP -  Cardiology (Cardiology) Renato Shin, MD (  Inactive) as Attending Physician (Internal Medicine) Bary Castilla, Forest Gleason, MD as Consulting Physician (General Surgery) Lucilla Lame, MD as Consulting Physician (Gastroenterology) Stanford Breed Denice Bors, MD as Consulting Physician (Cardiology) [name], RN as Case Manager (General Practice)  Indicate any recent Medical Services you may have received from other than [facility] providers in the past year (date may be approximate).     Assessment:   This is a routine wellness examination for [name].  Hearing/Vision screen No results found.  Dietary issues and exercise activities discussed:     Goals Addressed             This Visit's Progress    DIET - EAT MORE FRUITS AND VEGETABLES         Depression Screen    12/27/2021    2:56 PM 03/11/2021   10:24 AM 12/05/2020    8:00 AM 06/12/2020    9:18 AM 06/01/2020    9:15 AM 12/01/2019    9:51 AM 11/07/2019   12:19 PM  PHQ 2/9 Scores  PHQ - 2 Score 0 0 0 0 0 0 0  PHQ- 9 Score 0  0        Fall Risk    12/05/2020    7:59 AM 06/12/2020    9:18 AM 06/01/2020    9:15 AM 12/01/2019    9:51 AM 06/07/2019    8:13 AM  Fall Risk   Falls in the past year? 0 0 0 0 0  Number falls in past yr: 0  0 0 0  Injury with Fall? 0  0 0   Risk for fall due to :  Medication side effect     Follow up Falls evaluation completed Falls evaluation completed;Education provided;Falls prevention discussed Falls evaluation completed Falls evaluation completed Falls evaluation completed    FALL RISK PREVENTION PERTAINING TO THE HOME:  Any stairs in or around the home? No  If so, are there any without handrails? Yes  Home free of loose throw rugs in walkways, pet beds, electrical cords, etc? Yes  Adequate lighting in your home to reduce risk of falls? Yes   ASSISTIVE DEVICES UTILIZED TO PREVENT FALLS:  Life alert? No  Use of a cane, walker or w/c? Yes  Grab bars in the bathroom? Yes  Shower chair or bench in  shower? Yes  Elevated toilet seat or a handicapped toilet? Yes    Cognitive Function:declined to test 2023        06/12/2020    9:20 AM 06/01/2018    8:33 AM 05/26/2017    8:58 AM  6CIT Screen  What Year? 0 points 0 points 0 points  What month? 0 points 0 points 0 points  What time? 0 points 0 points 0 points  Count back from 20 0 points 0 points 0 points  Months in reverse 0 points 0 points 0 points  Repeat phrase 0 points 0 points 2 points  Total Score 0 points 0 points 2 points    Immunizations Immunization History  Administered Date(s) Administered   Influenza, High Dose Seasonal PF 03/02/2019, 03/19/2020, 03/19/2021   Influenza-Unspecified 03/17/2016, 03/30/2018, 03/16/2020   PFIZER(Purple Top)SARS-COV-2 Vaccination 08/24/2019, 09/14/2019, 03/20/2020   PNEUMOCOCCAL CONJUGATE-20 06/06/2021   Zoster, Live 04/29/2016    TDAP status: Up to date  Flu Vaccine status: Up to date  Pneumococcal vaccine status: Up to date  Covid-19 vaccine status: Completed vaccines  Qualifies for Shingles Vaccine? Yes   Zostavax completed Yes   Shingrix Completed?: No.  Education has been provided regarding the importance of this vaccine. Patient has been advised to call insurance company to determine out of pocket expense if they have not yet received this vaccine. Advised may also receive vaccine at local pharmacy or Health Dept. Verbalized acceptance and understanding.  Screening Tests Health Maintenance  Topic Date Due   Zoster Vaccines- Shingrix (1 of 2) Never done   DEXA SCAN  12/18/2019   COVID-19 Vaccine (4 - Pfizer series) 05/15/2020   INFLUENZA VACCINE  01/21/2022   MAMMOGRAM  12/18/2022   TETANUS/TDAP  04/26/2024   COLONOSCOPY (Pts 45-41yr Insurance coverage will need to be confirmed)  09/10/2028   Pneumonia Vaccine 66 Years old  Completed   Hepatitis C Screening  Completed   HPV VACCINES  Aged Out    Health Maintenance  Health Maintenance Due  Topic Date Due    Zoster Vaccines- Shingrix (1 of 2) Never done   DEXA SCAN  12/18/2019   COVID-19 Vaccine (4 - Pfizer series) 05/15/2020    Colorectal cancer screening: Type of screening: Colonoscopy. Completed 09/10/21. Repeat every 7 years  Mammogram status: Completed 12/17/20. Repeat every year-   Bone Density status: Completed 12/17/17. Results reflect: Bone density results: OSTEOPOROSIS. Repeat every 2 years.  Lung Cancer Screening: (Low Dose CT Chest recommended if Age 156-80years, 30 pack-year currently smoking OR have quit w/in 15years.) does not qualify.   Additional Screening:  Hepatitis C Screening: does qualify; Completed 04/10/16  Vision Screening: Recommended annual ophthalmology exams for early detection of glaucoma and other disorders of the eye. Is the patient up to date with their annual eye exam?  Yes  Who is the provider or what is the name of the office in which the patient attends annual eye exams? Dr.Nice If pt is not established with a provider, would they like to be referred to a provider to establish care? No .   Dental Screening: Recommended annual dental exams for proper oral hygiene  Community Resource Referral / Chronic Care Management: CRR required this visit?  No   CCM required this visit?  No      Plan:     I have personally reviewed and noted the following in the patient's chart:   Medical and social history Use of alcohol, tobacco or illicit drugs  Current medications and supplements including opioid prescriptions.  Functional ability and status Nutritional status Physical activity Advanced directives List of other physicians Hospitalizations, surgeries, and ER visits in previous 12 months Vitals Screenings to include cognitive, depression, and falls Referrals and appointments  In addition, I have reviewed and discussed with patient certain preventive protocols, quality metrics, and best practice recommendations. A written personalized care plan for  preventive services as well as general preventive health recommendations were provided to patient.     LDionisio David LPN   [id number]  Nurse Notes: none

## 2021-12-27 NOTE — Patient Instructions (Signed)
[name] , Thank you for taking time to come for your Medicare Wellness Visit. I appreciate your ongoing commitment to your health goals. Please review the following plan we discussed and let me know if I can assist you in the future.   Screening recommendations/referrals: Colonoscopy: 09/10/21 Mammogram: 12/17/20, referral sent Bone Density: 12/17/17, referral sent Recommended yearly ophthalmology/optometry visit for glaucoma screening and checkup Recommended yearly dental visit for hygiene and checkup  Vaccinations: Influenza vaccine: 03/19/21 Pneumococcal vaccine: 06/06/21 Tdap vaccine: 04/26/14 Shingles vaccine: Zostavax 04/29/16   Covid-19:08/24/19, 09/14/19, 03/20/20  Advanced directives: no  Conditions/risks identified: none  Next appointment: Follow up in one year for your annual wellness visit 01/02/23 @ 2pm by phone   Preventive Care 65 Years and Older, Female Preventive care refers to lifestyle choices and visits with your health care provider that can promote health and wellness. What does preventive care include? A yearly physical exam. This is also called an annual well check. Dental exams once or twice a year. Routine eye exams. Ask your health care provider how often you should have your eyes checked. Personal lifestyle choices, including: Daily care of your teeth and gums. Regular physical activity. Eating a healthy diet. Avoiding tobacco and drug use. Limiting alcohol use. Practicing safe sex. Taking low-dose aspirin every day. Taking vitamin and mineral supplements as recommended by your health care provider. What happens during an annual well check? The services and screenings done by your health care provider during your annual well check will depend on your age, overall health, lifestyle risk factors, and family history of disease. Counseling  Your health care provider may ask you questions about your: Alcohol use. Tobacco use. Drug use. Emotional  well-being. Home and relationship well-being. Sexual activity. Eating habits. History of falls. Memory and ability to understand (cognition). Work and work Statistician. Reproductive health. Screening  You may have the following tests or measurements: Height, weight, and BMI. Blood pressure. Lipid and cholesterol levels. These may be checked every 5 years, or more frequently if you are over 8 years old. Skin check. Lung cancer screening. You may have this screening every year starting at age 24 if you have a 30-pack-year history of smoking and currently smoke or have quit within the past 15 years. Fecal occult blood test (FOBT) of the stool. You may have this test every year starting at age 53. Flexible sigmoidoscopy or colonoscopy. You may have a sigmoidoscopy every 5 years or a colonoscopy every 10 years starting at age 29. Hepatitis C blood test. Hepatitis B blood test. Sexually transmitted disease (STD) testing. Diabetes screening. This is done by checking your blood sugar (glucose) after you have not eaten for a while (fasting). You may have this done every 1-3 years. Bone density scan. This is done to screen for osteoporosis. You may have this done starting at age 30. Mammogram. This may be done every 1-2 years. Talk to your health care provider about how often you should have regular mammograms. Talk with your health care provider about your test results, treatment options, and if necessary, the need for more tests. Vaccines  Your health care provider may recommend certain vaccines, such as: Influenza vaccine. This is recommended every year. Tetanus, diphtheria, and acellular pertussis (Tdap, Td) vaccine. You may need a Td booster every 10 years. Zoster vaccine. You may need this after age 66. Pneumococcal 13-valent conjugate (PCV13) vaccine. One dose is recommended after age 22. Pneumococcal polysaccharide (PPSV23) vaccine. One dose is recommended after age 60. Talk  to your  health care provider about which screenings and vaccines you need and how often you need them. This information is not intended to replace advice given to you by your health care provider. Make sure you discuss any questions you have with your health care provider. Document Released: 07/06/2015 Document Revised: 02/27/2016 Document Reviewed: 04/10/2015 Elsevier Interactive Patient Education  2017 [place] Prevention in the Home Falls can cause injuries. They can happen to people of all ages. There are many things you can do to make your home safe and to help prevent falls. What can I do on the outside of my home? Regularly fix the edges of walkways and driveways and fix any cracks. Remove anything that might make you trip as you walk through a door, such as a raised step or threshold. Trim any bushes or trees on the path to your home. Use bright outdoor lighting. Clear any walking paths of anything that might make someone trip, such as rocks or tools. Regularly check to see if handrails are loose or broken. Make sure that both sides of any steps have handrails. Any raised decks and porches should have guardrails on the edges. Have any leaves, snow, or ice cleared regularly. Use sand or salt on walking paths during winter. Clean up any spills in your garage right away. This includes oil or grease spills. What can I do in the bathroom? Use night lights. Install grab bars by the toilet and in the tub and shower. Do not use towel bars as grab bars. Use non-skid mats or decals in the tub or shower. If you need to sit down in the shower, use a plastic, non-slip stool. Keep the floor dry. Clean up any water that spills on the floor as soon as it happens. Remove soap buildup in the tub or shower regularly. Attach bath mats securely with double-sided non-slip rug tape. Do not have throw rugs and other things on the floor that can make you trip. What can I do in the bedroom? Use night  lights. Make sure that you have a light by your bed that is easy to reach. Do not use any sheets or blankets that are too big for your bed. They should not hang down onto the floor. Have a firm chair that has side arms. You can use this for support while you get dressed. Do not have throw rugs and other things on the floor that can make you trip. What can I do in the kitchen? Clean up any spills right away. Avoid walking on wet floors. Keep items that you use a lot in easy-to-reach places. If you need to reach something above you, use a strong step stool that has a grab bar. Keep electrical cords out of the way. Do not use floor polish or wax that makes floors slippery. If you must use wax, use non-skid floor wax. Do not have throw rugs and other things on the floor that can make you trip. What can I do with my stairs? Do not leave any items on the stairs. Make sure that there are handrails on both sides of the stairs and use them. Fix handrails that are broken or loose. Make sure that handrails are as long as the stairways. Check any carpeting to make sure that it is firmly attached to the stairs. Fix any carpet that is loose or worn. Avoid having throw rugs at the top or bottom of the stairs. If you do have throw rugs,  attach them to the floor with carpet tape. Make sure that you have a light switch at the top of the stairs and the bottom of the stairs. If you do not have them, ask someone to add them for you. What else can I do to help prevent falls? Wear shoes that: Do not have high heels. Have rubber bottoms. Are comfortable and fit you well. Are closed at the toe. Do not wear sandals. If you use a stepladder: Make sure that it is fully opened. Do not climb a closed stepladder. Make sure that both sides of the stepladder are locked into place. Ask someone to hold it for you, if possible. Clearly mark and make sure that you can see: Any grab bars or handrails. First and last  steps. Where the edge of each step is. Use tools that help you move around (mobility aids) if they are needed. These include: Canes. Walkers. Scooters. Crutches. Turn on the lights when you go into a dark area. Replace any light bulbs as soon as they burn out. Set up your furniture so you have a clear path. Avoid moving your furniture around. If any of your floors are uneven, fix them. If there are any pets around you, be aware of where they are. Review your medicines with your doctor. Some medicines can make you feel dizzy. This can increase your chance of falling. Ask your doctor what other things that you can do to help prevent falls. This information is not intended to replace advice given to you by your health care provider. Make sure you discuss any questions you have with your health care provider. Document Released: 04/05/2009 Document Revised: 11/15/2015 Document Reviewed: 07/14/2014 Elsevier Interactive Patient Education  2017 [employer].

## 2022-01-07 ENCOUNTER — Ambulatory Visit: Payer: Medicare HMO | Admitting: Licensed Clinical Social Worker

## 2022-01-08 ENCOUNTER — Ambulatory Visit: Payer: Medicare HMO

## 2022-01-08 DIAGNOSIS — I059 Rheumatic mitral valve disease, unspecified: Secondary | ICD-10-CM

## 2022-01-08 DIAGNOSIS — I4891 Unspecified atrial fibrillation: Secondary | ICD-10-CM

## 2022-01-08 DIAGNOSIS — Z5181 Encounter for therapeutic drug level monitoring: Secondary | ICD-10-CM

## 2022-01-08 DIAGNOSIS — I482 Chronic atrial fibrillation, unspecified: Secondary | ICD-10-CM | POA: Diagnosis not present

## 2022-01-08 DIAGNOSIS — Z9889 Other specified postprocedural states: Secondary | ICD-10-CM | POA: Diagnosis not present

## 2022-01-08 LAB — POCT INR: INR: 2.8 (ref 2.0–3.0)

## 2022-01-08 NOTE — Patient Instructions (Signed)
resume same dosage 1/2 a tablet daily except for 1 tablet on Mondays, Wednesdays and Fridays.  - Recheck INR in 6 weeks

## 2022-01-09 ENCOUNTER — Ambulatory Visit (INDEPENDENT_AMBULATORY_CARE_PROVIDER_SITE_OTHER): Payer: Medicare HMO

## 2022-01-09 ENCOUNTER — Telehealth: Payer: Medicare HMO

## 2022-01-09 DIAGNOSIS — R1319 Other dysphagia: Secondary | ICD-10-CM

## 2022-01-09 DIAGNOSIS — I482 Chronic atrial fibrillation, unspecified: Secondary | ICD-10-CM

## 2022-01-09 DIAGNOSIS — I10 Essential (primary) hypertension: Secondary | ICD-10-CM

## 2022-01-09 DIAGNOSIS — E7800 Pure hypercholesterolemia, unspecified: Secondary | ICD-10-CM

## 2022-01-09 NOTE — Chronic Care Management (AMB) (Signed)
Chronic Care Management   CCM RN Visit Note  01/09/2022 Name: [name] MRN: [id number] DOB: 01/28/1956  Subjective: [name] is a 66 y.o. year old female who is a primary care patient of Olin Hauser, DO. The care management team was consulted for assistance with disease management and care coordination needs.    Engaged with patient by telephone for follow up visit in response to provider referral for case management and/or care coordination services.   Consent to Services:  The patient was given information about Chronic Care Management services, agreed to services, and gave verbal consent prior to initiation of services.  Please see initial visit note for detailed documentation.   Patient agreed to services and verbal consent obtained.   Assessment: Review of patient past medical history, allergies, medications, health status, including review of consultants reports, laboratory and other test data, was performed as part of comprehensive evaluation and provision of chronic care management services.   SDOH (Social Determinants of Health) assessments and interventions performed:    CCM Care Plan  No Known Allergies  Outpatient Encounter Medications as of 01/09/2022  Medication Sig   acyclovir (ZOVIRAX) 400 MG tablet TAKE 1 TABLET BY MOUTH 3 TIMES DAILY FOR 5 TO 10 DAYS OR UNTIL HEALED FOR FEVER BLISTER.   alendronate (FOSAMAX) 35 MG tablet TAKE 1 TABLET BY MOUTH EVERY 7 DAYS. TAKE WITH A FULL GLASS OF WATER ON AN EMPTY STOMACH.   amitriptyline (ELAVIL) 50 MG tablet Take 50 mg by mouth at bedtime as needed for sleep.    aspirin EC 81 MG tablet Take 1 tablet (81 mg total) by mouth daily.   atorvastatin (LIPITOR) 40 MG tablet TAKE 1 TABLET BY MOUTH ONCE EVERY EVENING   calcium carbonate (OSCAL) 1500 (600 Ca) MG TABS tablet Take 1 tablet by mouth daily.    cetirizine (ZYRTEC) 10 MG tablet TAKE 1 TABLET BY MOUTH ONCE A DAY   Cholecalciferol (VITAMIN D3) 2000  units capsule Take 1 capsule (2,000 Units total) by mouth daily.   dicyclomine (BENTYL) 10 MG capsule TAKE 1 CAPSULE BY MOUTH 4 TIMES A DAY BEFORE MEALS AND AT BEDTIME.   enoxaparin (LOVENOX) 80 MG/0.8ML injection Inject 0.8 mLs (80 mg total) into the skin every 12 (twelve) hours for 10 days.   fluticasone (FLONASE) 50 MCG/ACT nasal spray PLACE 2 SPRAYS INTO BOTH NOSTRILS DAILY   furosemide (LASIX) 20 MG tablet Take 1 tablet (20 mg total) by mouth daily.   lisinopril (ZESTRIL) 40 MG tablet Take 1 tablet (40 mg total) by mouth daily.   methocarbamol (ROBAXIN) 500 MG tablet Take 1 tablet (500 mg total) by mouth 4 (four) times daily as needed for muscle spasms (pain).   metoprolol succinate (TOPROL-XL) 100 MG 24 hr tablet TAKE 1.5 TABLETS BY MOUTH DAILY. TAKE WITH OR IMMEDIATELY FOLLOWING A MEAL   Multiple Vitamins-Minerals (CENTRUM SILVER ADULT 50+ PO) Take 1 tablet by mouth daily.   triamcinolone cream (KENALOG) 0.5 % Apply 1 application topically 2 (two) times daily. To affected areas, for up to 2 weeks.   warfarin (COUMADIN) 5 MG tablet TAKE 1/2 TO 1 TABLET BY MOUTH DAILY AS DIRECTED BY COUMADIN CLINIC   No facility-administered encounter medications on file as of 01/09/2022.    Patient Active Problem List   Diagnosis Date Noted   Special screening for malignant neoplasms, colon    Polyp of ascending colon    Dysphagia 04/18/2021   Obesity (BMI 30.0-34.9) 06/08/2018   Elevated hemoglobin A1c  06/18/2017   Allergic rhinitis 12/02/2016   Vitamin D deficiency 12/02/2016   S/P MVR (mitral valve replacement) 06/03/2016   Osteopenia 06/03/2016   Environmental and seasonal allergies 06/03/2016   History of epigastric pain 03/13/2016   Breast microcalcifications 10/15/2015   Piriformis syndrome 10/02/2015   History of femur fracture 04/26/2014   Encounter for therapeutic drug monitoring 07/19/2013   Choledocholithiasis 08/14/2011   Anticoagulated on Coumadin 08/14/2011   Mitral valve disease  08/15/2010   MITRAL VALVE REPLACEMENT, HX OF 04/24/2009   History of hyperthyroidism 04/19/2009   Essential hypertension 10/17/2008   Pulmonary hypertension ([place]) 10/17/2008   RHEUMATIC HEART DISEASE, HX OF 10/17/2008   GOITER, MULTINODULAR 10/12/2007   HYPERCHOLESTEROLEMIA 10/12/2007   Chronic atrial fibrillation ([place]) 10/12/2007   GERD 10/12/2007    Conditions to be addressed/monitored:Atrial Fibrillation, HTN, HLD, and dysphagia and case closure  Care Plan : RNCM: General Plan of Care (Adult) for Chronic Disease Management and Care Coordination Needs  Updates made by [name], RN since 01/09/2022 12:00 AM  Completed 01/09/2022   Problem: RNCM: Development of Plan of care for Chronic Disease Management (AFIB, HTN, HLD, Dysphagia) Resolved 01/09/2022  Priority: High     Long-Range Goal: RNCM: Effective Management of Plan of care for Chronic Disease Management (AFIB, HTN, HLD, Dysphagia) Completed 01/09/2022  Start Date: 05/06/2021  Expected End Date: 05/06/2022  Priority: High  Note:   Current Barriers: 01-09-2022: Goals met and care plan is being closed. The patient is stable and knows how to reach the [facility] for new concerns or changes. The patient feels she is doing well and denies any acute findings today.  Knowledge Deficits related to plan of care for management of Atrial Fibrillation, HTN, HLD, and dysphagia  Chronic Disease Management support and education needs related to Atrial Fibrillation, HTN, HLD, and dysphagia  RNCM Clinical Goal(s):  Patient will verbalize understanding of plan for management of Atrial Fibrillation, HTN, HLD, and dysphagia as evidenced by following the plan of care, taking medications as directed, keeping follow up appointments and calling the office for changes in conditions  take all medications exactly as prescribed and will call provider for medication related questions as evidenced by compliance with medications    attend all scheduled medical  appointments: 06-10-2022 with pcp, knows to call sooner if needs arise. Had a colonoscopy and EGD recently in March, sees cardiology on a regular basis as evidenced by keeping appointments and calling for rescheduling needs         demonstrate improved and ongoing adherence to prescribed treatment plan for Atrial Fibrillation, HTN, HLD, and dysphagia as evidenced by seeing the providers as needed, and going to appointments coming up demonstrate a decrease in Atrial Fibrillation, HTN, HLD, and dsyphagia exacerbations  as evidenced by working with the CCM team to effectively manage health and well being demonstrate ongoing self health care management ability for effective management of chronic conditions as evidenced by  working with the CCM team through collaboration with Consulting civil engineer, provider, and care team.   Interventions: 1:1 collaboration with primary care provider regarding development and update of comprehensive plan of care as evidenced by provider attestation and co-signature Inter-disciplinary care team collaboration (see longitudinal plan of care) Evaluation of current treatment plan related to  self management and patient's adherence to plan as established by provider   SDOH Barriers (Status: Goal Met.) Long Term Goal  Patient interviewed and SDOH assessment performed        Patient interviewed and  appropriate assessments performed Provided patient with information about resources available and care guides to assist with changes in SDOH or new needs  Discussed plans with patient for ongoing care management follow up and provided patient with direct contact information for care management team Advised patient to call the office for changes in SDOH, new needs or concerns    A-fib:  (Status: Goal Met.) Long Term Goal  01-09-2022: Goals met and care plan is being closed  Counseled on increased risk of stroke due to Afib and benefits of anticoagulation for stroke prevention            Reviewed importance of adherence to anticoagulant exactly as prescribed. 07-08-2021: Is adherent with lab testing and anticoagulant therapy. Had been having to have more frequent blood work done but may be able to go longer as the levels have been more stable the last 2 times. 09-02-2021: The patient is having more stable levels. Has regular lab work when needed. 11-04-2021: The patient gets labs on a consistent basis. Labs for PT/INR have stabilized out. She denies any new concerns with management of AFIB. 01-09-2022: The patient states that she is finally doing better with her labs. Denies any new concerns.  Advised patient to discuss fluctuations in INR and effective AFIB management with provider. 07-08-2021: The patient is thankful that her labs have stabilized out some. Was having to go weekly. Last 2 checks are in range. 11-04-2021: Compliance noted with lab work. Knows to call for changes.  Counseled on bleeding risk associated with AFIB and importance of self-monitoring for signs/symptoms of bleeding. 11-04-2021: Review of safety and bleeding precautions.  Counseled on avoidance of NSAIDs due to increased bleeding risk with anticoagulants Counseled on importance of regular laboratory monitoring as prescribed. 11-04-2021: Is compliant with labwork  Counseled on seeking medical attention after a head injury or if there is blood in the urine/stool. Denies any injuries or changes in urine or stool color. Knows what precautions to take and sx and sx to look for.  Afib action plan reviewed Screening for signs and symptoms of depression related to chronic disease state Assessed social determinant of health barriers Evaluation of laboratory monitoring of PT/INR. The patient has been having to have weekly checks due to variations in readings. Her INR has been as low as 1.1 and as high as 9.7. She states they are having a hard time regulating her INR. 07-08-2021: More stable lab work. The patient states it has been  in range the last couple of times.   Dysphagia  (Status: Goal Met.) Long Term Goal 01-09-2022: Goals met and care plan is being closed  Evaluation of current treatment plan related to  Dysphagia ,  self-management and patient's adherence to plan as established by provider. 07-08-2021: The patient states her dysphagia is actually better but she did have an episode where she was drinking water and got choked. This was scary for her. She has an appointment on 08-05-2021 with Dr. Allen Norris for evaluation of dysphagia. Encouraged the patient to keep appointment for evaluation and recommendations. 09-02-2021: The patient is scheduled for an EGD and colonoscopy on 09-10-2021. The patient states that she has not been having issues with swallowing as bad as she was but she is hopeful for some answers. The patient denies any acute distress today. Is confident with the plan of care for effective management of her dysphagia and GI health and well being. 11-04-2021: The patient has completed her EGD and colonoscopy. The patient states that they  found one polyp that was cancerous but they removed it. They did stretch her esophagus and she says that has been helpful. She sometimes still gets choked but has noticed not as much. Last time she had an event was last week. She states, "I thought my sister was going to beat me to death". She is mindful of what she eats and to eat slowly.  Discussed plans with patient for ongoing care management follow up and provided patient with direct contact information for care management team Advised patient to call the provider office if she has not heard from the referral for GI. Saw Endocrinoligist on 04-18-2021 and referral placed to have further evaluation of dysphagia. The patient has not heard from the provider. Will call the office for follow up on referral placed. 07-08-2021: The patient has an appointment for 08-05-2021 with Dr. Allen Norris. 09-02-2021: Upcoming EGD and Colonoscopy. 11-04-2021: Has  completed her EGD and Colonoscopy. Knows to call for changes and routine follow ups ; Provided education to patient re: precautions when eating. The patient states she has noticed its better but sometimes she gets choked with drinking water; Reviewed medications with patient and discussed compliance; Reviewed scheduled/upcoming provider appointments including saw pcp on  06-06-2021, next appointment on 06-10-2022. Upcoming appointments with specialist  Review of safety precautions   Hyperlipidemia:  (Status: Goal Met.) Long Term Goal  01-09-2022: Goals met and care plan is being closed  Lab Results  Component Value Date   CHOL 167 05/30/2021   HDL 60 05/30/2021   LDLCALC 88 05/30/2021   LDLDIRECT 192.5 11/04/2010   TRIG 96 05/30/2021   CHOLHDL 2.8 05/30/2021     Medication review performed; medication list updated in electronic medical record. 08-05-2021: The patient takes Lipitor 40 mg QD. Denies any issues with cholesterol medications. 11-04-2021: The patient is compliant with medications. Denies any new issues with medications.  Provider established cholesterol goals reviewed. 11-04-2021: The patient is at goal. Denies any concerns with effective management of HLD.  Counseled on importance of regular laboratory monitoring as prescribed. 07-08-2021: Review of recent labwork in December. The patient has consistent labwork for monitoring of cholesterol levels.  Provided HLD educational materials; Reviewed role and benefits of statin for ASCVD risk reduction; Discussed strategies to manage statin-induced myalgias; Reviewed importance of limiting foods high in cholesterol. 11-04-2021: The patient states she does not have a good appetite but she is eating and is staying hydrated;  Hypertension: (Status: Goal Met.)01-09-2022: Goals met and care plan is being closed  Last practice recorded BP readings:  BP Readings from Last 3 Encounters:  10/28/21 (!) 156/84  09/10/21 136/69  08/05/21 (!) 151/88   Most recent eGFR/CrCl:  Lab Results  Component Value Date   EGFR 78 09/24/2021    No components found for: CRCL  Evaluation of current treatment plan related to hypertension self management and patient's adherence to plan as established by provider. 07-08-2021: The patient states her cardiologist increased her metoprolol in October to 150 mg. The patient denies any light headedness or dizziness. The patient states she does check her blood pressures at home but not every day. The patient denies any issues with heart health or HTN. Will continue to monitor. 09-02-2021: The patient with some elevations in blood pressures. She has a current sinus infection and does get anxious when going to the provider. The patient states she is feeling a lot better today. Will continue to monitor. Denies any acute findings related to her HTN or heart health today.  11-04-2021: Blood pressures are more stable and the patient states that  she feels great. She denies any acute distress or issues with heart health. Knows to call for changes or new needs.  Provided education to patient re: stroke prevention, s/s of heart attack and stroke; Reviewed prescribed diet heart healthy diet. 11-04-2021: Is compliant with dietary restrictions.  Reviewed medications with patient and discussed importance of compliance. 11-04-2021: The patient is compliant with medications;  Discussed plans with patient for ongoing care management follow up and provided patient with direct contact information for care management team; Advised patient, providing education and rationale, to monitor blood pressure daily and record, calling PCP for findings outside established parameters;  Advised patient to discuss blood pressure trends with provider. The patient states her blood pressure was elevated at endocrinologist office but it is better now. She has been taking at home and it is normal. States she was nervous because she had not seen the endocrinologist in  a couple of years.  Provided education on prescribed diet heart healthy ;  Discussed complications of poorly controlled blood pressure such as heart disease, stroke, circulatory complications, vision complications, kidney impairment, sexual dysfunction;    Sinus Infection  (Status: Goal Met.) Short Term Goal  11-04-2021: The patient denies any issues with sinus infection. States the sinus infection is resolved. Evaluation of current treatment plan related to  Sinus infection  ,  self-management and patient's adherence to plan as established by provider. Discussed plans with patient for ongoing care management follow up and provided patient with direct contact information for care management team Advised patient to call the office for changes in condition related to sinus infection, worsening sx and sx of infection: chills, fever, green or brown drainage, questions or concerns. The patient was seen in the urgent care over the weekend for a sinus infection. She was coughing up green sputum but now states it is cream colored and not green. Education and support given. ; Provided education to patient re: sx and sx or reinfection, taking medications for the full course, calling the provider for questions or concerns related to respiratory infections or other concerns; Reviewed medications with patient and discussed compliance ; Discussed plans with patient for ongoing care management follow up and provided patient with direct contact information for care management team; Advised patient to discuss call the pcp for changes in sx and sx, questions or concerns with provider;   Patient Goals/Self-Care Activities: Patient will self administer medications as prescribed as evidenced by self report/primary caregiver report  Patient will attend all scheduled provider appointments as evidenced by clinician review of documented attendance to scheduled appointments and patient/caregiver report Patient will call  pharmacy for medication refills as evidenced by patient report and review of pharmacy fill history as appropriate Patient will attend church or other social activities as evidenced by patient report Patient will continue to perform ADL's independently as evidenced by patient/caregiver report Patient will continue to perform IADL's independently as evidenced by patient/caregiver report Patient will call provider office for new concerns or questions as evidenced by review of documented incoming telephone call notes and patient report Patient will work with BSW to address care coordination needs and will continue to work with the clinical team to address health care and disease management related needs as evidenced by documented adherence to scheduled care management/care coordination appointments - check pulse (heart) rate before taking medicine - make a plan to exercise regularly - make a plan to eat healthy - keep  all lab appointments - take medicine as prescribed - check blood pressure 3 times per week - choose a place to take my blood pressure (home, clinic or office, retail store) - write blood pressure results in a log or diary - learn about high blood pressure - keep a blood pressure log - take blood pressure log to all doctor appointments - call doctor for signs and symptoms of high blood pressure - develop an action plan for high blood pressure - keep all doctor appointments - take medications for blood pressure exactly as prescribed - begin an exercise program - report new symptoms to your doctor - eat more whole grains, fruits and vegetables, lean meats and healthy fats - call for medicine refill 2 or 3 days before it runs out - take all medications exactly as prescribed - call doctor with any symptoms you believe are related to your medicine - call doctor when you experience any new symptoms - go to all doctor appointments as scheduled - adhere to prescribed diet: Heart Healthy  diet  Eye exam scheduled for 07-09-2021        Plan:No further follow up required: the patient has met the goals of care and the care plan has been closed.  Noreene Larsson RN, MSN, [place] Mobile: [phone]
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
## 2022-01-13 NOTE — Chronic Care Management (AMB) (Signed)
[facility]) Care Management  01/13/2022  [name] [birth year] [id number]  Patient is not receiving current CCM LCSW services.   Christa See, MSW, [facility] Care Management [name] Phone [phone] 2:19 PM

## 2022-01-20 DIAGNOSIS — I10 Essential (primary) hypertension: Secondary | ICD-10-CM | POA: Diagnosis not present

## 2022-01-20 DIAGNOSIS — I482 Chronic atrial fibrillation, unspecified: Secondary | ICD-10-CM | POA: Diagnosis not present

## 2022-01-20 DIAGNOSIS — E7800 Pure hypercholesterolemia, unspecified: Secondary | ICD-10-CM | POA: Diagnosis not present

## 2022-02-02 DIAGNOSIS — Z7901 Long term (current) use of anticoagulants: Secondary | ICD-10-CM | POA: Diagnosis not present

## 2022-02-02 DIAGNOSIS — L989 Disorder of the skin and subcutaneous tissue, unspecified: Secondary | ICD-10-CM | POA: Diagnosis not present

## 2022-02-04 DIAGNOSIS — L989 Disorder of the skin and subcutaneous tissue, unspecified: Secondary | ICD-10-CM | POA: Diagnosis not present

## 2022-02-04 DIAGNOSIS — R233 Spontaneous ecchymoses: Secondary | ICD-10-CM | POA: Diagnosis not present

## 2022-02-04 DIAGNOSIS — Z7901 Long term (current) use of anticoagulants: Secondary | ICD-10-CM | POA: Diagnosis not present

## 2022-02-07 DIAGNOSIS — L989 Disorder of the skin and subcutaneous tissue, unspecified: Secondary | ICD-10-CM | POA: Diagnosis not present

## 2022-02-07 DIAGNOSIS — R233 Spontaneous ecchymoses: Secondary | ICD-10-CM | POA: Diagnosis not present

## 2022-02-17 DIAGNOSIS — M79642 Pain in left hand: Secondary | ICD-10-CM | POA: Diagnosis not present

## 2022-02-17 DIAGNOSIS — R2232 Localized swelling, mass and lump, left upper limb: Secondary | ICD-10-CM | POA: Diagnosis not present

## 2022-02-19 ENCOUNTER — Ambulatory Visit: Payer: Medicare HMO | Attending: Cardiovascular Disease

## 2022-02-19 DIAGNOSIS — I4891 Unspecified atrial fibrillation: Secondary | ICD-10-CM

## 2022-02-19 DIAGNOSIS — Z9889 Other specified postprocedural states: Secondary | ICD-10-CM

## 2022-02-19 DIAGNOSIS — Z5181 Encounter for therapeutic drug level monitoring: Secondary | ICD-10-CM | POA: Diagnosis not present

## 2022-02-19 DIAGNOSIS — I482 Chronic atrial fibrillation, unspecified: Secondary | ICD-10-CM

## 2022-02-19 DIAGNOSIS — I059 Rheumatic mitral valve disease, unspecified: Secondary | ICD-10-CM

## 2022-02-19 LAB — POCT INR: INR: 3.7 — AB (ref 2.0–3.0)

## 2022-02-19 NOTE — Patient Instructions (Signed)
HOLD TODAY and then resume same dosage 1/2 a tablet daily except for 1 tablet on Mondays, Wednesdays and Fridays.  - Recheck INR in 3 weeks

## 2022-02-25 DIAGNOSIS — R2232 Localized swelling, mass and lump, left upper limb: Secondary | ICD-10-CM | POA: Diagnosis not present

## 2022-02-25 DIAGNOSIS — S63650D Sprain of metacarpophalangeal joint of right index finger, subsequent encounter: Secondary | ICD-10-CM | POA: Diagnosis not present

## 2022-02-25 DIAGNOSIS — S63631A Sprain of interphalangeal joint of left index finger, initial encounter: Secondary | ICD-10-CM | POA: Diagnosis not present

## 2022-02-25 DIAGNOSIS — M79642 Pain in left hand: Secondary | ICD-10-CM | POA: Diagnosis not present

## 2022-03-10 ENCOUNTER — Ambulatory Visit
Admission: RE | Admit: 2022-03-10 | Discharge: 2022-03-10 | Disposition: A | Discharge status: Home / Self Care | Payer: Medicare HMO | Source: Ambulatory Visit | Attending: Family Medicine | Admitting: Family Medicine

## 2022-03-10 DIAGNOSIS — Z780 Asymptomatic menopausal state: Secondary | ICD-10-CM

## 2022-03-10 DIAGNOSIS — Z1231 Encounter for screening mammogram for malignant neoplasm of breast: Secondary | ICD-10-CM | POA: Insufficient documentation

## 2022-03-10 DIAGNOSIS — M810 Age-related osteoporosis without current pathological fracture: Secondary | ICD-10-CM | POA: Diagnosis not present

## 2022-03-11 DIAGNOSIS — S63650D Sprain of metacarpophalangeal joint of right index finger, subsequent encounter: Secondary | ICD-10-CM | POA: Diagnosis not present

## 2022-03-12 ENCOUNTER — Ambulatory Visit: Payer: Medicare HMO | Attending: Cardiology

## 2022-03-12 DIAGNOSIS — Z9889 Other specified postprocedural states: Secondary | ICD-10-CM

## 2022-03-12 DIAGNOSIS — I4891 Unspecified atrial fibrillation: Secondary | ICD-10-CM

## 2022-03-12 DIAGNOSIS — I482 Chronic atrial fibrillation, unspecified: Secondary | ICD-10-CM

## 2022-03-12 DIAGNOSIS — I059 Rheumatic mitral valve disease, unspecified: Secondary | ICD-10-CM

## 2022-03-12 DIAGNOSIS — Z5181 Encounter for therapeutic drug level monitoring: Secondary | ICD-10-CM

## 2022-03-12 LAB — POCT INR: INR: 2.5 (ref 2.0–3.0)

## 2022-03-12 NOTE — Patient Instructions (Signed)
Continue same dosage 1/2 a tablet daily except for 1 tablet on Mondays, Wednesdays and Fridays.  - Recheck INR in 5 weeks 

## 2022-03-25 DIAGNOSIS — R2231 Localized swelling, mass and lump, right upper limb: Secondary | ICD-10-CM | POA: Diagnosis not present

## 2022-03-25 DIAGNOSIS — S63630A Sprain of interphalangeal joint of right index finger, initial encounter: Secondary | ICD-10-CM | POA: Diagnosis not present

## 2022-03-25 DIAGNOSIS — M79641 Pain in right hand: Secondary | ICD-10-CM | POA: Diagnosis not present

## 2022-04-03 DIAGNOSIS — S63650D Sprain of metacarpophalangeal joint of right index finger, subsequent encounter: Secondary | ICD-10-CM | POA: Diagnosis not present

## 2022-04-07 ENCOUNTER — Other Ambulatory Visit: Payer: Self-pay | Admitting: Family Medicine

## 2022-04-07 DIAGNOSIS — B001 Herpesviral vesicular dermatitis: Secondary | ICD-10-CM

## 2022-04-08 NOTE — Telephone Encounter (Signed)
Requested Prescriptions  Pending Prescriptions Disp Refills  . acyclovir (ZOVIRAX) 400 MG tablet [Pharmacy Med Name: ACYCLOVIR 400 MG Tablet] 90 tablet 0    Sig: TAKE 1 TABLET BY MOUTH 3 TIMES DAILY FOR 5 TO 10 DAYS OR UNTIL HEALED FOR FEVER BLISTER.     Antimicrobials:  Antiviral Agents - Anti-Herpetic Passed - 04/07/2022  3:50 PM      Passed - Valid encounter within last 12 months    Recent Outpatient Visits          10 months ago Annual physical exam   [place], DO   1 year ago GOITER, [facility] Olin Hauser, DO   1 year ago Annual physical exam   [facility] Olin Hauser, DO   2 years ago Elevated hemoglobin A1c   [facility] Olin Hauser, DO   2 years ago Annual physical exam   [facility] Olin Hauser, DO      Future Appointments            In 2 months [place], Devonne Doughty, [facility], [facility]

## 2022-04-13 ENCOUNTER — Other Ambulatory Visit: Payer: Self-pay | Admitting: Cardiology

## 2022-04-16 ENCOUNTER — Ambulatory Visit: Payer: Medicare HMO | Attending: Cardiology

## 2022-04-16 DIAGNOSIS — I059 Rheumatic mitral valve disease, unspecified: Secondary | ICD-10-CM | POA: Diagnosis not present

## 2022-04-16 DIAGNOSIS — Z5181 Encounter for therapeutic drug level monitoring: Secondary | ICD-10-CM

## 2022-04-16 DIAGNOSIS — I482 Chronic atrial fibrillation, unspecified: Secondary | ICD-10-CM | POA: Diagnosis not present

## 2022-04-16 DIAGNOSIS — Z9889 Other specified postprocedural states: Secondary | ICD-10-CM | POA: Diagnosis not present

## 2022-04-16 DIAGNOSIS — I4891 Unspecified atrial fibrillation: Secondary | ICD-10-CM | POA: Diagnosis not present

## 2022-04-16 LAB — POCT INR: INR: 2.7 (ref 2.0–3.0)

## 2022-04-16 NOTE — Patient Instructions (Signed)
Continue same dosage 1/2 a tablet daily except for 1 tablet on Mondays, Wednesdays and Fridays.  - Recheck INR in 5 weeks 

## 2022-04-18 ENCOUNTER — Ambulatory Visit: Payer: Medicare HMO | Admitting: Endocrinology

## 2022-05-14 ENCOUNTER — Other Ambulatory Visit: Payer: Self-pay | Admitting: Family Medicine

## 2022-05-14 DIAGNOSIS — G8929 Other chronic pain: Secondary | ICD-10-CM

## 2022-05-14 NOTE — Telephone Encounter (Signed)
Requested medication (s) are due for refill today:yes  Requested medication (s) are on the active medication list: yes  Last refill:  07/05/21  Future visit scheduled:yes  Notes to clinic:  Unable to refill per protocol, cannot delegate.      Requested Prescriptions  Pending Prescriptions Disp Refills   methocarbamol (ROBAXIN) 500 MG tablet [Pharmacy Med Name: METHOCARBAMOL 500 MG Tablet] 30 tablet 3    Sig: TAKE 1 TABLET FOUR TIMES DAILY AS NEEDED FOR MUSCLE SPASMS (PAIN)     Not Delegated - Analgesics:  Muscle Relaxants Failed - 05/14/2022  3:51 PM      Failed - This refill cannot be delegated      Passed - Valid encounter within last 6 months    Recent Outpatient Visits           11 months ago Annual physical exam   [place], DO   1 year ago GOITER, [facility] Olin Hauser, DO   1 year ago Annual physical exam   [facility] Olin Hauser, DO   2 years ago Elevated hemoglobin A1c   [facility] Olin Hauser, DO   2 years ago Annual physical exam   [facility] Olin Hauser, DO       Future Appointments             In 3 weeks Parks Ranger, Devonne Doughty, [facility], [facility]

## 2022-05-21 ENCOUNTER — Ambulatory Visit: Payer: Medicare HMO | Attending: Cardiology

## 2022-05-21 DIAGNOSIS — I059 Rheumatic mitral valve disease, unspecified: Secondary | ICD-10-CM | POA: Diagnosis not present

## 2022-05-21 DIAGNOSIS — I4891 Unspecified atrial fibrillation: Secondary | ICD-10-CM

## 2022-05-21 DIAGNOSIS — Z5181 Encounter for therapeutic drug level monitoring: Secondary | ICD-10-CM | POA: Diagnosis not present

## 2022-05-21 DIAGNOSIS — Z9889 Other specified postprocedural states: Secondary | ICD-10-CM | POA: Diagnosis not present

## 2022-05-21 DIAGNOSIS — I482 Chronic atrial fibrillation, unspecified: Secondary | ICD-10-CM

## 2022-05-21 LAB — POCT INR: INR: 2.6 (ref 2.0–3.0)

## 2022-05-21 NOTE — Patient Instructions (Signed)
Continue same dosage 1/2 a tablet daily except for 1 tablet on Mondays, Wednesdays and Fridays.  - Recheck INR in 5 weeks

## 2022-06-02 ENCOUNTER — Other Ambulatory Visit: Payer: Self-pay

## 2022-06-02 DIAGNOSIS — Z0000 Encounter for general adult medical examination without abnormal findings: Secondary | ICD-10-CM

## 2022-06-02 DIAGNOSIS — I059 Rheumatic mitral valve disease, unspecified: Secondary | ICD-10-CM

## 2022-06-02 DIAGNOSIS — I10 Essential (primary) hypertension: Secondary | ICD-10-CM

## 2022-06-02 DIAGNOSIS — I482 Chronic atrial fibrillation, unspecified: Secondary | ICD-10-CM

## 2022-06-02 DIAGNOSIS — R7309 Other abnormal glucose: Secondary | ICD-10-CM

## 2022-06-02 DIAGNOSIS — I4891 Unspecified atrial fibrillation: Secondary | ICD-10-CM

## 2022-06-02 DIAGNOSIS — Z9889 Other specified postprocedural states: Secondary | ICD-10-CM

## 2022-06-02 DIAGNOSIS — Z5181 Encounter for therapeutic drug level monitoring: Secondary | ICD-10-CM

## 2022-06-02 DIAGNOSIS — E7800 Pure hypercholesterolemia, unspecified: Secondary | ICD-10-CM

## 2022-06-02 DIAGNOSIS — E042 Nontoxic multinodular goiter: Secondary | ICD-10-CM

## 2022-06-03 ENCOUNTER — Other Ambulatory Visit: Payer: Medicare HMO

## 2022-06-03 DIAGNOSIS — I482 Chronic atrial fibrillation, unspecified: Secondary | ICD-10-CM | POA: Diagnosis not present

## 2022-06-03 DIAGNOSIS — Z0000 Encounter for general adult medical examination without abnormal findings: Secondary | ICD-10-CM | POA: Diagnosis not present

## 2022-06-03 DIAGNOSIS — R7309 Other abnormal glucose: Secondary | ICD-10-CM | POA: Diagnosis not present

## 2022-06-03 DIAGNOSIS — E042 Nontoxic multinodular goiter: Secondary | ICD-10-CM | POA: Diagnosis not present

## 2022-06-03 DIAGNOSIS — E7800 Pure hypercholesterolemia, unspecified: Secondary | ICD-10-CM | POA: Diagnosis not present

## 2022-06-03 DIAGNOSIS — I10 Essential (primary) hypertension: Secondary | ICD-10-CM | POA: Diagnosis not present

## 2022-06-04 LAB — CBC WITH DIFFERENTIAL/PLATELET
Absolute Monocytes: 413 cells/uL (ref 200–950)
Basophils Absolute: 48 cells/uL (ref 0–200)
Basophils Relative: 0.9 %
Eosinophils Absolute: 101 cells/uL (ref 15–500)
Eosinophils Relative: 1.9 %
HCT: 40.9 % (ref 35.0–45.0)
Hemoglobin: 13.6 g/dL (ref 11.7–15.5)
Lymphs Abs: 1357 cells/uL (ref 850–3900)
MCH: 28.6 pg (ref 27.0–33.0)
MCHC: 33.3 g/dL (ref 32.0–36.0)
MCV: 85.9 fL (ref 80.0–100.0)
MPV: 12.9 fL — ABNORMAL HIGH (ref 7.5–12.5)
Monocytes Relative: 7.8 %
Neutro Abs: 3381 cells/uL (ref 1500–7800)
Neutrophils Relative %: 63.8 %
Platelets: 189 10*3/uL (ref 140–400)
RBC: 4.76 10*6/uL (ref 3.80–5.10)
RDW: 14.3 % (ref 11.0–15.0)
Total Lymphocyte: 25.6 %
WBC: 5.3 10*3/uL (ref 3.8–10.8)

## 2022-06-04 LAB — COMPLETE METABOLIC PANEL WITH GFR
AG Ratio: 1.1 (calc) (ref 1.0–2.5)
ALT: 12 U/L (ref 6–29)
AST: 19 U/L (ref 10–35)
Albumin: 3.8 g/dL (ref 3.6–5.1)
Alkaline phosphatase (APISO): 100 U/L (ref 37–153)
BUN: 13 mg/dL (ref 7–25)
CO2: 29 mmol/L (ref 20–32)
Calcium: 9.6 mg/dL (ref 8.6–10.4)
Chloride: 104 mmol/L (ref 98–110)
Creat: 0.85 mg/dL (ref 0.50–1.05)
Globulin: 3.6 g/dL (calc) (ref 1.9–3.7)
Glucose, Bld: 96 mg/dL (ref 65–99)
Potassium: 4.5 mmol/L (ref 3.5–5.3)
Sodium: 141 mmol/L (ref 135–146)
Total Bilirubin: 0.9 mg/dL (ref 0.2–1.2)
Total Protein: 7.4 g/dL (ref 6.1–8.1)
eGFR: 75 mL/min/{1.73_m2} (ref >=60)

## 2022-06-04 LAB — HEMOGLOBIN A1C
Hgb A1c MFr Bld: 5.6 % of total Hgb (ref <5.7)
Mean Plasma Glucose: 114 mg/dL
eAG (mmol/L): 6.3 mmol/L

## 2022-06-04 LAB — LIPID PANEL
Cholesterol: 162 mg/dL (ref <200)
HDL: 64 mg/dL (ref >=50)
LDL Cholesterol (Calc): 80 mg/dL (calc)
Non-HDL Cholesterol (Calc): 98 mg/dL (calc) (ref <130)
Total CHOL/HDL Ratio: 2.5 (calc) (ref <5.0)
Triglycerides: 94 mg/dL (ref <150)

## 2022-06-04 LAB — T4, FREE: Free T4: 1.1 ng/dL (ref 0.8–1.8)

## 2022-06-04 LAB — TSH: TSH: 1.09 mIU/L (ref 0.40–4.50)

## 2022-06-10 ENCOUNTER — Ambulatory Visit (INDEPENDENT_AMBULATORY_CARE_PROVIDER_SITE_OTHER): Payer: Medicare HMO | Admitting: Family Medicine

## 2022-06-10 VITALS — BP 124/70 | HR 85 | Ht 64.0 in | Wt 163.0 lb

## 2022-06-10 DIAGNOSIS — J3089 Other allergic rhinitis: Secondary | ICD-10-CM

## 2022-06-10 DIAGNOSIS — M8589 Other specified disorders of bone density and structure, multiple sites: Secondary | ICD-10-CM | POA: Diagnosis not present

## 2022-06-10 DIAGNOSIS — E7800 Pure hypercholesterolemia, unspecified: Secondary | ICD-10-CM

## 2022-06-10 DIAGNOSIS — G8929 Other chronic pain: Secondary | ICD-10-CM

## 2022-06-10 DIAGNOSIS — R7309 Other abnormal glucose: Secondary | ICD-10-CM

## 2022-06-10 DIAGNOSIS — B001 Herpesviral vesicular dermatitis: Secondary | ICD-10-CM | POA: Diagnosis not present

## 2022-06-10 DIAGNOSIS — I10 Essential (primary) hypertension: Secondary | ICD-10-CM

## 2022-06-10 DIAGNOSIS — I482 Chronic atrial fibrillation, unspecified: Secondary | ICD-10-CM

## 2022-06-10 DIAGNOSIS — Z0000 Encounter for general adult medical examination without abnormal findings: Secondary | ICD-10-CM | POA: Diagnosis not present

## 2022-06-10 DIAGNOSIS — E663 Overweight: Secondary | ICD-10-CM

## 2022-06-10 DIAGNOSIS — M25512 Pain in left shoulder: Secondary | ICD-10-CM

## 2022-06-10 DIAGNOSIS — Z5181 Encounter for therapeutic drug level monitoring: Secondary | ICD-10-CM

## 2022-06-10 MED ORDER — ATORVASTATIN CALCIUM 40 MG PO TABS: 40.0000 mg | ORAL_TABLET | Freq: Every day | ORAL | 3 refills | Status: DC

## 2022-06-10 MED ORDER — FLUTICASONE PROPIONATE 50 MCG/ACT NA SUSP: 2.0000 | Freq: Every day | NASAL | 3 refills | Status: DC

## 2022-06-10 MED ORDER — METOPROLOL SUCCINATE ER 100 MG PO TB24: ORAL_TABLET | ORAL | 3 refills | Status: DC

## 2022-06-10 MED ORDER — LISINOPRIL 40 MG PO TABS: 40.0000 mg | ORAL_TABLET | Freq: Every day | ORAL | 3 refills | Status: DC

## 2022-06-10 MED ORDER — ACYCLOVIR 400 MG PO TABS: ORAL_TABLET | ORAL | 3 refills | Status: DC

## 2022-06-10 MED ORDER — METHOCARBAMOL 500 MG PO TABS: ORAL_TABLET | ORAL | 3 refills | Status: DC

## 2022-06-10 MED ORDER — FUROSEMIDE 20 MG PO TABS: 20.0000 mg | ORAL_TABLET | ORAL | 3 refills | Status: DC

## 2022-06-10 NOTE — Patient Instructions (Addendum)
Thank you for coming to the office today.  All medications refilled  Stop taking Alendronate (Fosamax)  [facility]  Endocrinologist in [place], [place]  Address: [address], [place], [place] [address] Phone: [phone]  They can discuss the injectable or infusion therapy, usually at their office every several months maybe every 6 months.  --------------------------------  Try OTC Melatonin 1 to '3mg'$  at first, then up to '5mg'$  or higher maybe 30 min before bed. Should help you have better sleep onset  If not working let me know we can try a medication.  Sleep Hygiene Recommendations to promote healthy sleep in all patients, especially if symptoms of insomnia are worsening. Due to the nature of sleep rhythms, if your body gets "out of rhythm", it may take some time before your sleep cycle can be "reset".  Please try to follow as many of the following tips as you can, usually there are only a few of these are the primary cause of the problem.  ?To reset your sleep rhythm, go to bed and get up at the same time every day ?Sleep only long enough to feel rested and then get out of bed ?Do not try to force yourself to sleep. If you can't sleep, get out of bed and try again later. ?Avoid naps during the day, unless excessively tired. The more sleeping during the day, then the less sleep your body needs at night.  ?Have coffee, tea, and other foods that have caffeine only in the morning ?Exercise several days a week, but not right before bed ?If you drink alcohol, prefer to have appropriate drink with one meal, but prefer to avoid alcohol in the evening, and bedtime ?If you smoke, avoid smoking, especially in the evening  ?Avoid watching TV or looking at phones, computers, or reading devices ("e-books") that give off light at least 30 minutes before bed. This artificial light sends "awake signals" to your brain and can make it harder to fall  asleep. ?Make your bedroom a comfortable place where it is easy to fall asleep: Put up shades or special blackout curtains to block light from outside. Use a white noise machine to block noise. Keep the temperature cool. ?Try your best to solve or at least address your problems before you go to bed ?Use relaxation techniques to manage stress. Ask your health care provider to suggest some techniques that may work well for you. These may include: Breathing exercises. Routines to release muscle tension. Visualizing peaceful scenes.     Please schedule a Follow-up Appointment to: Return in about 1 year (around 06/11/2023) for 1 year fasting lab only then 1 week later Annual Physical.  If you have any other questions or concerns, please feel free to call the office or send a message through [place]. You may also schedule an earlier appointment if necessary.  Additionally, you may be receiving a survey about your experience at our office within a few days to 1 week by e-mail or mail. We value your feedback.  Nobie Putnam, DO [place]

## 2022-06-10 NOTE — Progress Notes (Unsigned)
Subjective:    Patient ID: [name], female    DOB: [birth year], 66 y.o.   MRN: [id number]  [name] is a 66 y.o. female presenting on 06/10/2022 for Annual Exam   HPI  Here for Annual Physical and Lab Review.   Follow-up Thyroid Nodules Low TSH Repeat labs normalized TSH T4. No diagnosis of hypothyroidism Never on medication   Overweight BMI >29 Improved weight - Diet: No particular diet. Reducing tea intake, working on improving water - Exercise:  improving gradual    CHRONIC HTN: Reports no new concerns Current Meds - Lisinopril 63m daily, Metoprolol XL 1081mdaily   Reports good compliance, took meds today. Tolerating well, w/o complaints. Denies CP, dyspnea, HA, edema, dizziness / lightheadedness    Chronic Atrial Fibrillation, s/p Mitral Valve Replacement, chronic anticoagulation Pulmonary HTN / PAD - [facility] Breedcontinues to f/u with [facility] Coumadin [facility], she had ECHO on 11/12/17 with normal LVEF and mild mod pulm HTN, lasix 2070mvery other day was added. Otherwise remains on anticoagulation with coumadin for s/p MVR, rate control for permanent AFib. - They said her heart valve is expired but functioning well - Potassium was normal range. - She has followed Cardiology w/ variable INR, has repeat today.  Cataract, Right eye Mild vision deficit. Not ready for procedure  Insomnia, sleep onset Difficulty falling asleep  Health Maintenance:  UTD PNEUMONIA vaccine   Flu Shot updated RSV Shot completed  Considering Shingles vaccine     06/10/2022    8:02 AM 12/27/2021    2:56 PM 03/11/2021   10:24 AM  Depression screen PHQ 2/9  Decreased Interest 0 0 0  Down, Depressed, Hopeless 0 0 0  PHQ - 2 Score 0 0 0  Altered sleeping 0 0   Tired, decreased energy 0 0   Change in appetite 0 0   Feeling bad or failure about yourself  0 0   Trouble concentrating 0 0   Moving slowly or fidgety/restless 0 0   Suicidal thoughts  0 0   PHQ-9 Score 0 0   Difficult doing work/chores Not difficult at all Not difficult at all     Past Medical History:  Diagnosis Date   Anticoagulated on warfarin    Arthritis    Heart murmur    History of cardiac catheterization    a. 2008 Cath: nl cors.   History of hyperthyroidism    a. 2003--  PTU [place]   Hypercholesterolemia    Hyperlipidemia    Multinodular goiter    Permanent atrial fibrillation ([facility]  a. Dx 1997; b. CHA2DS2VASc = 3-->warfarin (s/p MVR as well); c. 06/2013 Holter: HRs freq in low 100's to 1-teens w/ max rate 179-->beta blocker increased.   Pulmonary hypertension, mild (HCC)    Rheumatic heart disease    S/P MVR (mitral valve replacement)    a. 1997 s/p SJM mechanical MVR 2/2 Sev MS; b. chronic warfarin; c. 10/2017 Echo: EF 55-60%, no rwma, triv AI. Nl fxn of MV prosthesis. Sev dil LA. Mildly to mod increased PA pressures.   Past Surgical History:  Procedure Laterality Date   ANTERIOR CERVICAL DECOMP/DISCECTOMY FUSION  12-21-2001   C5 --  C6   BREAST BIOPSY Left 11/03/2016   Procedure: BREAST BIOPSY WITH NEEDLE LOCALIZATION;  Surgeon: ByrRobert BellowD;  Location: [facility] OR[facility];  Service: General;  Laterality: Left;   BREAST EXCISIONAL BIOPSY Left 2018   CARDIAC CATHETERIZATION  12-28-2006  DR [facility]   NORMAL CORONARY ARTERIES   CARPAL TUNNEL RELEASE Right 01-20-2007   COLONOSCOPY  2015   COLONOSCOPY WITH PROPOFOL N/A 09/10/2021   Procedure: COLONOSCOPY WITH PROPOFOL;  Surgeon: Lucilla Lame, MD;  Location: [facility] ENDOSCOPY;  Service: Endoscopy;  Laterality: N/A;   ERCP  08/15/2011   Procedure: ENDOSCOPIC RETROGRADE CHOLANGIOPANCREATOGRAPHY (ERCP);  Surgeon: Jeryl Columbia, MD;  Location: [facility] ENDOSCOPY;  Service: Endoscopy;  Laterality: N/A;   ERCP     MULTIPLE--  INCLUDING STENTS, SPHINTEROTOMY'S  STONE REMOVAL    ESOPHAGOGASTRODUODENOSCOPY (EGD) WITH PROPOFOL  09/10/2021   Procedure: ESOPHAGOGASTRODUODENOSCOPY (EGD) WITH PROPOFOL;  Surgeon: Lucilla Lame, MD;   Location: [facility] ENDOSCOPY;  Service: Endoscopy;;   FEMUR IM NAIL Right 04/26/2014   Procedure: INTRAMEDULLARY (IM) NAIL FEMORAL;  Surgeon: Alta Corning, MD;  Location: [place];  Service: Orthopedics;  Laterality: Right;   I & D EXTREMITY Right 02/07/2013   Procedure: IRRIGATION AND DEBRIDEMENT OF RIGHT LOWER LEG WITH PLACEMENT OF ACELL AND WOULND VAC;  Surgeon: Theodoro Kos, DO;  Location: [place];  Service: Plastics;  Laterality: Right;   LAPAROSCOPIC CHOLECYSTECTOMY  04-08-2002   MITRAL VALVE REPLACEMENT  1997   [facility]   ORIF ANKLE FRACTURE Right 11/05/2012   Procedure: OPEN REDUCTION INTERNAL FIXATION (ORIF) ANKLE FRACTURE only operating on right;  Surgeon: Alta Corning, MD;  Location: [place];  Service: Orthopedics;  Laterality: Right;   ORIF RIGHT ULNAR FX W/ ILIAC CREST BONE GRAFT  10-06-2008   ORIF WRIST FRACTURE Right 04/26/2014   Procedure: OPEN REDUCTION INTERNAL FIXATION (ORIF) WRIST FRACTURE;  Surgeon: Alta Corning, MD;  Location: [place];  Service: Orthopedics;  Laterality: Right;   TRANSTHORACIC ECHOCARDIOGRAM  06-29-2009  dr wall   normal lvsf/ ef 55-60%/  normal bileaflet mechanical mvr/ moderated dilated la/ moderate tr   WRIST ARTHROSCOPY Right 12-10-2007   debridement and ulnar shortening osteotomy w/ plate   Social History   Socioeconomic History   Marital status: Divorced    Spouse name: Not on file   Number of children: 0   Years of education: 12   Highest education level: 12th grade  Occupational History   Occupation: Control and instrumentation engineer    Comment: retired  Tobacco Use   Smoking status: Never   Smokeless tobacco: Never  Vaping Use   Vaping Use: Never used  Substance and Sexual Activity   Alcohol use: No   Drug use: No   Sexual activity: Not on file  Other Topics Concern   Not on file  Social History Narrative   Not on file   Social Determinants of Health   Financial Resource Strain: Low Risk  (12/27/2021)   Overall Financial Resource  Strain (CARDIA)    Difficulty of Paying Living Expenses: Not hard at all  Food Insecurity: No Food Insecurity (12/27/2021)   Hunger Vital Sign    Worried About Running Out of Food in the Last Year: Never true    [place] in the Last Year: Never true  Transportation Needs: No Transportation Needs (12/27/2021)   PRAPARE - Hydrologist (Medical): No    Lack of Transportation (Non-Medical): No  Physical Activity: Sufficiently Active (12/27/2021)   Exercise Vital Sign    Days of Exercise per Week: 4 days    Minutes of Exercise per Session: 40 min  Stress: No Stress Concern Present (12/27/2021)   [place]  Feeling of Stress : Only a little  Social Connections: Moderately Isolated (12/27/2021)   Social Connection and Isolation Panel [NHANES]    Frequency of Communication with Friends and Family: More than three times a week    Frequency of Social Gatherings with Friends and Family: Twice a week    Attends Religious Services: More than 4 times per year    Active Member of [employer] or Organizations: No    Attends Archivist Meetings: Never    Marital Status: Divorced  Human resources officer Violence: Not At Risk (12/27/2021)   Humiliation, Afraid, Rape, and Kick questionnaire    Fear of Current or Ex-Partner: No    Emotionally Abused: No    Physically Abused: No    Sexually Abused: No   Family History  Problem Relation Age of Onset   Hypothyroidism Sister    Gallbladder disease Sister    Coronary artery disease Other    Diabetes Mother    Hypertension Mother    Heart disease Father    Stroke Father    Breast cancer Neg Hx    Current Outpatient Medications on File Prior to Visit  Medication Sig   alendronate (FOSAMAX) 35 MG tablet TAKE 1 TABLET BY MOUTH EVERY 7 DAYS. TAKE WITH A FULL GLASS OF WATER ON AN EMPTY STOMACH.   amitriptyline (ELAVIL) 50 MG tablet Take 50 mg by mouth at bedtime  as needed for sleep.    aspirin EC 81 MG tablet Take 1 tablet (81 mg total) by mouth daily.   calcium carbonate (OSCAL) 1500 (600 Ca) MG TABS tablet Take 1 tablet by mouth daily.    cetirizine (ZYRTEC) 10 MG tablet TAKE 1 TABLET BY MOUTH ONCE A DAY   Cholecalciferol (VITAMIN D3) 2000 units capsule Take 1 capsule (2,000 Units total) by mouth daily.   dicyclomine (BENTYL) 10 MG capsule TAKE 1 CAPSULE BY MOUTH 4 TIMES A DAY BEFORE MEALS AND AT BEDTIME.   Multiple Vitamins-Minerals (CENTRUM SILVER ADULT 50+ PO) Take 1 tablet by mouth daily.   triamcinolone cream (KENALOG) 0.5 % Apply 1 application topically 2 (two) times daily. To affected areas, for up to 2 weeks.   warfarin (COUMADIN) 5 MG tablet TAKE 1/2 TO 1 TABLET BY MOUTH DAILY AS DIRECTED BY COUMADIN CLINIC   No current facility-administered medications on file prior to visit.    Review of Systems  Constitutional:  Negative for activity change, appetite change, chills, diaphoresis, fatigue and fever.  HENT:  Negative for congestion and hearing loss.   Eyes:  Negative for visual disturbance.  Respiratory:  Negative for cough, chest tightness, shortness of breath and wheezing.   Cardiovascular:  Negative for chest pain, palpitations and leg swelling.  Gastrointestinal:  Negative for abdominal pain, constipation, diarrhea, nausea and vomiting.  Genitourinary:  Negative for dysuria, frequency and hematuria.  Musculoskeletal:  Negative for arthralgias and neck pain.  Skin:  Negative for rash.  Neurological:  Negative for dizziness, weakness, light-headedness, numbness and headaches.  Hematological:  Negative for adenopathy.  Psychiatric/Behavioral:  Negative for behavioral problems, dysphoric mood and sleep disturbance.    Per HPI unless specifically indicated above     Objective:    BP 124/70   Pulse 85   Ht _0  (1.626 m)   Wt 163 lb (73.9 kg)   SpO2 99%   BMI 27.98 kg/m   Wt Readings from Last 3 Encounters:  06/10/22 163 lb  (73.9 kg)  12/27/21 167 lb (75.8 kg)  10/28/21 167 lb (  75.8 kg)    Physical Exam Vitals and nursing note reviewed.  Constitutional:      General: She is not in acute distress.    Appearance: She is well-developed. She is not diaphoretic.     Comments: Well-appearing, comfortable, cooperative  HENT:     Head: Normocephalic and atraumatic.  Eyes:     General:        Right eye: No discharge.        Left eye: No discharge.     Conjunctiva/sclera: Conjunctivae normal.     Pupils: Pupils are equal, round, and reactive to light.  Neck:     Thyroid: No thyromegaly.  Cardiovascular:     Rate and Rhythm: Normal rate and regular rhythm.     Pulses: Normal pulses.     Heart sounds: Normal heart sounds. No murmur heard. Pulmonary:     Effort: Pulmonary effort is normal. No respiratory distress.     Breath sounds: Normal breath sounds. No wheezing or rales.  Abdominal:     General: Bowel sounds are normal. There is no distension.     Palpations: Abdomen is soft. There is no mass.     Tenderness: There is no abdominal tenderness.  Musculoskeletal:        General: No tenderness. Normal range of motion.     Cervical back: Normal range of motion and neck supple.     Right lower leg: No edema.     Left lower leg: No edema.     Comments: Upper / Lower Extremities: - Normal muscle tone, strength bilateral upper extremities 5/5, lower extremities 5/5  Lymphadenopathy:     Cervical: No cervical adenopathy.  Skin:    General: Skin is warm and dry.     Findings: No erythema or rash.  Neurological:     Mental Status: She is alert and oriented to person, place, and time.     Comments: Distal sensation intact to light touch all extremities  Psychiatric:        Mood and Affect: Mood normal.        Behavior: Behavior normal.        Thought Content: Thought content normal.     Comments: Well groomed, good eye contact, normal speech and thoughts     I have personally reviewed the radiology  report from 03/10/22.  Narrative & Impression  EXAM: DUAL X-RAY ABSORPTIOMETRY (DXA) FOR BONE MINERAL DENSITY   IMPRESSION: Your patient [name] completed a BMD test on 03/10/2022 using the [employer] (software version: 14.10) manufactured by [employer]. The following summarizes the results of our evaluation. Technologist: SCE   PATIENT BIOGRAPHICAL: Name: [name], [name] Patient ID: [id number] Birth Date: 11-10-1955 Height: 62.5 in. Gender: Female Exam Date: 03/10/2022 Weight: 166.1 lbs. Indications: Caucasian, Height Loss, High Risk Meds, History of Fracture (Adult), Hypothyroid, Osteopenia, Parent Hip Fracture, Postmenopausal, right hip surgery, Vitamin D Deficiency Fractures: Left ankle, Right ankle, Right femur, Right forearm Treatments: Alendronate, Calcium, Multi-Vitamin, Robaxin, Vitamin D, ZYRTEC   DENSITOMETRY RESULTS: Site       Region Measured Date Measured Age WHO Classification Young Adult T-score BMD         %Change vs. Previous Significant Change (*) AP Spine L1-L4 1/10/[age] 68.5 Normal -0.7 1.107 g/cm2 - -   Left Femur Neck 1/10/[age] 68.5 Osteoporosis -2.6 0.678 g/cm2 - -   ASSESSMENT: The BMD measured at Femur Neck is 0.678 g/cm2 with a T-score of -2.6. This patient is considered  osteoporotic according to [facility]) criteria. The scan quality is good.   World Pharmacologist [facility]) criteria for post-menopausal, Caucasian Women: Normal:                   T-score at or above -1 SD Osteopenia/low bone mass: T-score between -1 and -2.5 SD Osteoporosis:             T-score at or below -2.5 SD   RECOMMENDATIONS: 1. All patients should optimize calcium and vitamin D intake. 2. Consider FDA-approved medical therapies in postmenopausal women and men aged 42 years and older, based on the following: a. A hip or vertebral(clinical or morphometric) fracture b. T-score < -2.5 at the femoral neck or spine  after appropriate evaluation to exclude secondary causes c. Low bone mass (T-score between -1.0 and -2.5 at the femoral neck or spine) and a 10-year probability of a hip fracture > 3% or a 10-year probability of a major osteoporosis-related fracture > 20% based on the [place]-adapted WHO algorithm 3. Clinician judgment and/or patient preferences may indicate treatment for people with 10-year fracture probabilities above or below these levels   FOLLOW-UP: People with diagnosed cases of osteoporosis or at high risk for fracture should have regular bone mineral density tests. For patients eligible for Medicare, routine testing is allowed once every 2 years. The testing frequency can be increased to one year for patients who have rapidly progressing disease, those who are receiving or discontinuing medical therapy to restore bone mass, or have additional risk factors.   I have reviewed this report, and agree with the above findings.   [facility]     Electronically Signed   By: Ammie Ferrier M.D.   On: 03/10/2022 09:43    Results for orders placed or performed in visit on 06/02/22  T4, free  Result Value Ref Range   Free T4 1.1 0.8 - 1.8 ng/dL  TSH  Result Value Ref Range   TSH 1.09 0.40 - 4.50 mIU/L  Hemoglobin A1c  Result Value Ref Range   Hgb A1c MFr Bld 5.6 <5.7 % of total Hgb   Mean Plasma Glucose 114 mg/dL   eAG (mmol/L) 6.3 mmol/L  Lipid panel  Result Value Ref Range   Cholesterol 162 <200 mg/dL   HDL 64 > OR = 50 mg/dL   Triglycerides 94 <150 mg/dL   LDL Cholesterol (Calc) 80 mg/dL (calc)   Total CHOL/HDL Ratio 2.5 <5.0 (calc)   Non-HDL Cholesterol (Calc) 98 <130 mg/dL (calc)  COMPLETE METABOLIC PANEL WITH GFR  Result Value Ref Range   Glucose, Bld 96 65 - 99 mg/dL   BUN 13 7 - 25 mg/dL   Creat 0.85 0.50 - 1.05 mg/dL   eGFR 75 > OR = 60 mL/min/1.80m   BUN/Creatinine Ratio SEE NOTE: 6 - 22 (calc)   Sodium 141 135 - 146 mmol/L   Potassium 4.5  3.5 - 5.3 mmol/L   Chloride 104 98 - 110 mmol/L   CO2 29 20 - 32 mmol/L   Calcium 9.6 8.6 - 10.4 mg/dL   Total Protein 7.4 6.1 - 8.1 g/dL   Albumin 3.8 3.6 - 5.1 g/dL   Globulin 3.6 1.9 - 3.7 g/dL (calc)   AG Ratio 1.1 1.0 - 2.5 (calc)   Total Bilirubin 0.9 0.2 - 1.2 mg/dL   Alkaline phosphatase (APISO) 100 37 - 153 U/L   AST 19 10 - 35 U/L   ALT 12 6 - 29 U/L  CBC with Differential/Platelet  Result Value Ref Range   WBC 5.3 3.8 - 10.8 Thousand/uL   RBC 4.76 3.80 - 5.10 Million/uL   Hemoglobin 13.6 11.7 - 15.5 g/dL   HCT 40.9 35.0 - 45.0 %   MCV 85.9 80.0 - 100.0 fL   MCH 28.6 27.0 - 33.0 pg   MCHC 33.3 32.0 - 36.0 g/dL   RDW 14.3 11.0 - 15.0 %   Platelets 189 140 - 400 Thousand/uL   MPV 12.9 (H) 7.5 - 12.5 fL   Neutro Abs 3,381 1,500 - 7,800 cells/uL   Lymphs Abs 1,357 850 - 3,900 cells/uL   Absolute Monocytes 413 200 - 950 cells/uL   Eosinophils Absolute 101 15 - 500 cells/uL   Basophils Absolute 48 0 - 200 cells/uL   Neutrophils Relative % 63.8 %   Total Lymphocyte 25.6 %   Monocytes Relative 7.8 %   Eosinophils Relative 1.9 %   Basophils Relative 0.9 %      Assessment & Plan:   Problem List Items Addressed This Visit     Allergic rhinitis   Relevant Medications   fluticasone (FLONASE) 50 MCG/ACT nasal spray   Chronic atrial fibrillation (HCC)    Stable AFib, without recurrence RVR. On anticoagulation Followed by [facility]iology on anticoagulation Coumadin clinic Continue rate control Metoprolol      Relevant Medications   atorvastatin (LIPITOR) 40 MG tablet   furosemide (LASIX) 20 MG tablet   lisinopril (ZESTRIL) 40 MG tablet   metoprolol succinate (TOPROL-XL) 100 MG 24 hr tablet   Elevated hemoglobin A1c    Slight increase A1c at 5.6 Below PreDM Not on any therapy currently - remain off Encourage improved lifestyle - low carb, low sugar diet, limit sweet tea intake, reduce portion size, continue improving regular exercise      Encounter for  therapeutic drug monitoring   Relevant Medications   furosemide (LASIX) 20 MG tablet   Essential hypertension    Controlled HTN repeat manual improved - Home BP readings stable  Complicated by chronic Atrial Fibrillation, MVR, Pulm HTN    Plan:  1. Continue current BP regimen Lisinopril 15m daily, Metoprolol XL 1040mdaily 2. Encourage improved lifestyle - low sodium diet, regular exercise 3. Continue monitor BP outside office, bring readings to next visit, if persistently >140/90 or new symptoms notify office sooner      Relevant Medications   atorvastatin (LIPITOR) 40 MG tablet   furosemide (LASIX) 20 MG tablet   lisinopril (ZESTRIL) 40 MG tablet   metoprolol succinate (TOPROL-XL) 100 MG 24 hr tablet   HYPERCHOLESTEROLEMIA    Controlled cholesterol on statin and lifestyle Last lipid panel 05/2022  Plan: 1. Continue current meds - Atorvastatin 4041m. Encourage improved lifestyle - low carb/cholesterol, reduce portion size, continue improving regular exercise      Relevant Medications   atorvastatin (LIPITOR) 40 MG tablet   furosemide (LASIX) 20 MG tablet   lisinopril (ZESTRIL) 40 MG tablet   metoprolol succinate (TOPROL-XL) 100 MG 24 hr tablet   Osteopenia   Relevant Orders   Ambulatory referral to Endocrinology   Overweight (BMI 25.0-29.9)   Other Visit Diagnoses     Annual physical exam    -  Primary   Fever blister       Relevant Medications   acyclovir (ZOVIRAX) 400 MG tablet   Chronic left shoulder pain       Relevant Medications   methocarbamol (ROBAXIN) 500 MG tablet  Updated Health Maintenance information Reviewed recent lab results with patient Encouraged improvement to lifestyle with diet and exercise Goal of weight loss  Osteopenia Repeat DEXA decline in bone mineral density Failed Aleondronate 5 years discontinue now  referral to Endocrinology for osteopenia management, has failed 5 years of Alendronate without significant success /  improvement. She would like to consider other therapy options infusion / injectable options for advanced Osteopenia vs Osteoporosis therapy  Insomnia, sleep onset Try OTC Melatonin 1 to 60m at first, then up to 559mor higher maybe 30 min before bed. Should help you have better sleep onset AVS Sleep hygiene  Orders Placed This Encounter  Procedures   Ambulatory referral to Endocrinology    Referral Priority:   Routine    Referral Type:   Consultation    Referral Reason:   Specialty Services Required    Number of Visits Requested:   1    Meds ordered this encounter  Medications   acyclovir (ZOVIRAX) 400 MG tablet    Sig: TAKE 1 TABLET BY MOUTH 3 TIMES DAILY FOR 5 TO 10 DAYS OR UNTIL HEALED FOR FEVER BLISTER.    Dispense:  90 tablet    Refill:  3   atorvastatin (LIPITOR) 40 MG tablet    Sig: Take 1 tablet (40 mg total) by mouth daily.    Dispense:  90 tablet    Refill:  3   fluticasone (FLONASE) 50 MCG/ACT nasal spray    Sig: Place 2 sprays into both nostrils daily.    Dispense:  48 g    Refill:  3   furosemide (LASIX) 20 MG tablet    Sig: Take 1 tablet (20 mg total) by mouth every other day.    Dispense:  45 tablet    Refill:  3   lisinopril (ZESTRIL) 40 MG tablet    Sig: Take 1 tablet (40 mg total) by mouth daily.    Dispense:  90 tablet    Refill:  3   methocarbamol (ROBAXIN) 500 MG tablet    Sig: TAKE 1 TABLET FOUR TIMES DAILY AS NEEDED FOR MUSCLE SPASMS (PAIN)    Dispense:  90 tablet    Refill:  3   metoprolol succinate (TOPROL-XL) 100 MG 24 hr tablet    Sig: Take with or immediately following a meal.    Dispense:  135 tablet    Refill:  3    Follow up plan: Return in about 1 year (around 06/11/2023) for 1 year fasting lab only then 1 week later Annual Physical.  Future labs  AlNobie PutnamDOFruitdaleroup 06/10/2022, 8:15 AM

## 2022-06-11 ENCOUNTER — Other Ambulatory Visit: Payer: Self-pay | Admitting: Family Medicine

## 2022-06-11 DIAGNOSIS — I10 Essential (primary) hypertension: Secondary | ICD-10-CM

## 2022-06-11 DIAGNOSIS — R7309 Other abnormal glucose: Secondary | ICD-10-CM

## 2022-06-11 DIAGNOSIS — E7800 Pure hypercholesterolemia, unspecified: Secondary | ICD-10-CM

## 2022-06-11 DIAGNOSIS — I482 Chronic atrial fibrillation, unspecified: Secondary | ICD-10-CM

## 2022-06-11 DIAGNOSIS — E663 Overweight: Secondary | ICD-10-CM

## 2022-06-11 DIAGNOSIS — Z0000 Encounter for general adult medical examination without abnormal findings: Secondary | ICD-10-CM

## 2022-06-11 DIAGNOSIS — E042 Nontoxic multinodular goiter: Secondary | ICD-10-CM

## 2022-06-11 NOTE — Assessment & Plan Note (Signed)
Controlled cholesterol on statin and lifestyle Last lipid panel 05/2022  Plan: 1. Continue current meds - Atorvastatin '40mg'$  2. Encourage improved lifestyle - low carb/cholesterol, reduce portion size, continue improving regular exercise

## 2022-06-11 NOTE — Assessment & Plan Note (Signed)
Stable AFib, without recurrence RVR. On anticoagulation Followed by [facility]iology on anticoagulation Coumadin clinic Continue rate control Metoprolol

## 2022-06-11 NOTE — Assessment & Plan Note (Signed)
Slight increase A1c at 5.6 Below PreDM Not on any therapy currently - remain off Encourage improved lifestyle - low carb, low sugar diet, limit sweet tea intake, reduce portion size, continue improving regular exercise

## 2022-06-11 NOTE — Assessment & Plan Note (Signed)
Controlled HTN repeat manual improved - Home BP readings stable  Complicated by chronic Atrial Fibrillation, MVR, Pulm HTN    Plan:  1. Continue current BP regimen Lisinopril '40mg'$  daily, Metoprolol XL '100mg'$  daily 2. Encourage improved lifestyle - low sodium diet, regular exercise 3. Continue monitor BP outside office, bring readings to next visit, if persistently >140/90 or new symptoms notify office sooner

## 2022-06-12 ENCOUNTER — Other Ambulatory Visit: Payer: Self-pay | Admitting: Family Medicine

## 2022-06-12 DIAGNOSIS — I10 Essential (primary) hypertension: Secondary | ICD-10-CM

## 2022-06-12 DIAGNOSIS — I482 Chronic atrial fibrillation, unspecified: Secondary | ICD-10-CM

## 2022-06-12 MED ORDER — METOPROLOL SUCCINATE ER 100 MG PO TB24: 150.0000 mg | ORAL_TABLET | Freq: Every day | ORAL | 3 refills | Status: DC

## 2022-06-25 ENCOUNTER — Ambulatory Visit: Payer: Medicare HMO | Attending: Cardiology

## 2022-06-25 DIAGNOSIS — I059 Rheumatic mitral valve disease, unspecified: Secondary | ICD-10-CM | POA: Diagnosis not present

## 2022-06-25 DIAGNOSIS — Z5181 Encounter for therapeutic drug level monitoring: Secondary | ICD-10-CM | POA: Diagnosis not present

## 2022-06-25 DIAGNOSIS — I482 Chronic atrial fibrillation, unspecified: Secondary | ICD-10-CM

## 2022-06-25 DIAGNOSIS — Z9889 Other specified postprocedural states: Secondary | ICD-10-CM

## 2022-06-25 LAB — POCT INR: INR: 3.4 — AB (ref 2.0–3.0)

## 2022-06-25 NOTE — Patient Instructions (Signed)
Continue same dosage 1/2 a tablet daily except for 1 tablet on Mondays, Wednesdays and Fridays.  - Recheck INR in 5 weeks

## 2022-06-26 ENCOUNTER — Other Ambulatory Visit: Payer: Self-pay | Admitting: Cardiology

## 2022-06-26 DIAGNOSIS — I482 Chronic atrial fibrillation, unspecified: Secondary | ICD-10-CM

## 2022-06-26 NOTE — Telephone Encounter (Signed)
Prescription refill request received for warfarin Lov: 07/24/21 Stanford Breed)  Next INR check: 07/30/22 Warfarin tablet strength: '5mg'$   Appropriate dose and refill sent to requested pharmacy.

## 2022-07-10 ENCOUNTER — Ambulatory Visit: Payer: Self-pay

## 2022-07-10 NOTE — Telephone Encounter (Signed)
Message from Alm Bustard sent at 07/10/2022  9:06 AM EST  Summary: diarrhea   Patient states that she has been having diarrhea all night and is wanting to know what she can take. Per patient her stomach is hurting as well.   Please advise        Called pt initially on mobile number but call would not complete. Called home number and LM on VM of the same and call back number to speak to nurse.

## 2022-07-10 NOTE — Telephone Encounter (Addendum)
Message from Alm Bustard sent at 07/10/2022  9:06 AM EST  Summary: diarrhea   Patient states that she has been having diarrhea all night and is wanting to know what she can take. Per patient her stomach is hurting as well.   Please advise        Called pt on mobile and home- LM on home Mobile is not working number.Routing to office -unable to contact pt after 4 attempts.

## 2022-07-10 NOTE — Telephone Encounter (Signed)
Message from Alm Bustard sent at 07/10/2022  9:06 AM EST  Summary: diarrhea   Patient states that she has been having diarrhea all night and is wanting to know what she can take. Per patient her stomach is hurting as well.   Please advise        Called mobile number "call cannot be completed as dialed." Called home number and LM on VM.

## 2022-07-11 ENCOUNTER — Other Ambulatory Visit: Payer: Self-pay | Admitting: Family Medicine

## 2022-07-11 DIAGNOSIS — J3089 Other allergic rhinitis: Secondary | ICD-10-CM

## 2022-07-14 NOTE — Telephone Encounter (Signed)
Requested Prescriptions  Pending Prescriptions Disp Refills   cetirizine (ZYRTEC) 10 MG tablet [Pharmacy Med Name: CETIRIZINE HCL 10 MG TAB] 90 tablet 0    Sig: TAKE 1 TABLET BY MOUTH ONCE A DAY     Ear, Nose, and Throat:  Antihistamines 2 Passed - 07/11/2022  4:52 PM      Passed - Cr in normal range and within 360 days    Creat  Date Value Ref Range Status  06/03/2022 0.85 0.50 - 1.05 mg/dL Final   Creatinine, Urine  Date Value Ref Range Status  08/18/2007 145.6  Final         Passed - Valid encounter within last 12 months    Recent Outpatient Visits           1 month ago Annual physical exam   [facility] Olin Hauser, DO   1 year ago Annual physical exam   [facility] [place], Devonne Doughty, DO   1 year ago [facility], [facility] Olin Hauser, DO   2 years ago Annual physical exam   [facility] [place], Devonne Doughty, DO   2 years ago Elevated hemoglobin A1c   Bruin, Alexander J, DO       Future Appointments             In 2 weeks [facility] N[facility] Phill Myron, NP [place] at [facility]   In 11 months [facility], [facility]             fluticasone [facility]) 50 MCG/ACT nasal spray [Pharmacy Med Name: FLUTICASONE PROPIONATE 50 MCG/ACT N] 16 g 0    Sig: PLACE 2 SPRAYS INTO BOTH NOSTRILS DAILY     Ear, Nose, and Throat: Nasal Preparations - Corticosteroids Passed - 07/11/2022  4:52 PM      Passed - Valid encounter within last 12 months    Recent Outpatient Visits           1 month ago Annual physical exam   [facility], Devonne Doughty, DO   1 year ago Annual physical exam   [facility] [place], Devonne Doughty, DO   1 year ago GOITER,  [facility] Olin Hauser, DO   2 years ago Annual physical exam   [facility] [place], Devonne Doughty, DO   2 years ago Elevated hemoglobin A1c   Per[place], Alexander J, DO       Future Appointments             In 2 weeks Purcell Nails, Phill Myron, NP [place] at [facility]   In 11 months [facility], [facility]

## 2022-07-29 DIAGNOSIS — H2513 Age-related nuclear cataract, bilateral: Secondary | ICD-10-CM | POA: Diagnosis not present

## 2022-07-29 DIAGNOSIS — H524 Presbyopia: Secondary | ICD-10-CM | POA: Diagnosis not present

## 2022-07-29 DIAGNOSIS — Z135 Encounter for screening for eye and ear disorders: Secondary | ICD-10-CM | POA: Diagnosis not present

## 2022-07-29 DIAGNOSIS — H52223 Regular astigmatism, bilateral: Secondary | ICD-10-CM | POA: Diagnosis not present

## 2022-07-29 DIAGNOSIS — H5203 Hypermetropia, bilateral: Secondary | ICD-10-CM | POA: Diagnosis not present

## 2022-07-29 NOTE — Progress Notes (Signed)
Cardiology Clinic Note   Patient Name: [name] Date of Encounter: 08/01/2022  Primary Care Provider:  Olin Hauser, DO Primary Cardiologist:  Kirk Ruths, MD  Patient Profile    67 year old female with history of mitral valve replacement for rheumatic heart disease. Patient had mitral valve replacement in 1997; permanent atrial fibrillation,CHADS VASC Score 3 (Age, HTN, Gender) on coumadin, and HTN,  Cardiac catheterization in 2008 showed normal coronary arteries; HL. Last seen in the office on 07/24/2021 by Dr. Stanford Breed with no new testing or medication changes.   Past Medical History    Past Medical History:  Diagnosis Date   Anticoagulated on warfarin    Arthritis    Heart murmur    History of cardiac catheterization    a. 2008 Cath: nl cors.   History of hyperthyroidism    a. 2003--  PTU [place]   Hypercholesterolemia    Hyperlipidemia    Multinodular goiter    Permanent atrial fibrillation ([place])    a. Dx 1997; b. CHA2DS2VASc = 3-->warfarin (s/p MVR as well); c. 06/2013 Holter: HRs freq in low 100's to 1-teens w/ max rate 179-->beta blocker increased.   Pulmonary hypertension, mild (HCC)    Rheumatic heart disease    S/P MVR (mitral valve replacement)    a. 1997 s/p SJM mechanical MVR 2/2 Sev MS; b. chronic warfarin; c. 10/2017 Echo: EF 55-60%, no rwma, triv AI. Nl fxn of MV prosthesis. Sev dil LA. Mildly to mod increased PA pressures.   Past Surgical History:  Procedure Laterality Date   ANTERIOR CERVICAL DECOMP/DISCECTOMY FUSION  12-21-2001   C5 --  C6   BREAST BIOPSY Left 11/03/2016   Procedure: BREAST BIOPSY WITH NEEDLE LOCALIZATION;  Surgeon: Robert Bellow, MD;  Location: [facility] ORS;  Service: General;  Laterality: Left;   BREAST EXCISIONAL BIOPSY Left 2018   CARDIAC CATHETERIZATION  12-28-2006  [facility]   NORMAL CORONARY ARTERIES   CARPAL TUNNEL RELEASE Right 01-20-2007   COLONOSCOPY  2015   COLONOSCOPY WITH PROPOFOL N/A 09/10/2021    Procedure: COLONOSCOPY WITH PROPOFOL;  Surgeon: Lucilla Lame, MD;  [facility] END[facility];  Service: Endoscopy;  Laterality: N/A;   ERCP  08/15/2011   Procedure: ENDOSCOPIC RETROGRADE CHOLANGIOPANCREATOGRAPHY (ERCP);  Surgeon: Jeryl Columbia, MD;  Location: [facility] ENDOSCOPY;  Service: Endoscopy;  Laterality: N/A;   ERCP     MULTIPLE--  INCLUDING STENTS, SPHINTEROTOMY'S  STONE REMOVAL    ESOPHAGOGASTRODUODENOSCOPY (EGD) WITH PROPOFOL  09/10/2021   Procedure: ESOPHAGOGASTRODUODENOSCOPY (EGD) WITH PROPOFOL;  Surgeon: Lucilla Lame, MD;  Location: [facility] ENDOSCOPY;  Service: Endoscopy;;   FEMUR IM NAIL Right 04/26/2014   Procedure: INTRAMEDULLARY (IM) NAIL FEMORAL;  Surgeon: Alta Corning, MD;  Location: [place];  Service: Orthopedics;  Laterality: Right;   I & D EXTREMITY Right 02/07/2013   Procedure: IRRIGATION AND DEBRIDEMENT OF RIGHT LOWER LEG WITH PLACEMENT OF ACELL AND WOULND VAC;  Surgeon: Theodoro Kos, DO;  Location: [place];  Service: Plastics;  Laterality: Right;   LAPAROSCOPIC CHOLECYSTECTOMY  04-08-2002   MITRAL VALVE REPLACEMENT  1997   [facility] JUDE   ORIF ANKLE FRACTURE Right 11/05/2012   Procedure: OPEN REDUCTION INTERNAL FIXATION (ORIF) ANKLE FRACTURE only operating on right;  Surgeon: Alta Corning, MD;  Location: [place];  Service: Orthopedics;  Laterality: Right;   ORIF RIGHT ULNAR FX W/ ILIAC CREST BONE GRAFT  10-06-2008   ORIF WRIST FRACTURE Right 04/26/2014   Procedure: OPEN REDUCTION INTERNAL FIXATION (ORIF) WRIST  FRACTURE;  Surgeon: Alta Corning, MD;  Location: [place];  Service: Orthopedics;  Laterality: Right;   TRANSTHORACIC ECHOCARDIOGRAM  06-29-2009  dr wall   normal lvsf/ ef 55-60%/  normal bileaflet mechanical mvr/ moderated dilated la/ moderate tr   WRIST ARTHROSCOPY Right 12-10-2007   debridement and ulnar shortening osteotomy w/ plate    Allergies  No Known Allergies  History of Present Illness    Mrs. Malmborg is a very pleasant lady who comes today for  ongoing assessment of mitral valve replacement in the setting of rheumatic heart disease in 1997 on coumadin (followed in the [place] office,  normal coronary arteries per cath in 2008, with history of rapid HR on beta blocker, HTN.   She is doing well without any significant cardiac complaints.  She does state on occasion when lying in bed at night she does feel her heart racing briefly, take some deep breaths and it tends to go away.  She is recently also been started on Abrysvo injections for osteoporosis.  She denies any chest discomfort, dyspnea on exertion, or significant fatigue.  She admits to drinking diet Dr. Samson Frederic throughout the day.  Home Medications    Current Outpatient Medications  Medication Sig Dispense Refill   ABRYSVO 120 MCG/0.5ML injection      acyclovir (ZOVIRAX) 400 MG tablet TAKE 1 TABLET BY MOUTH 3 TIMES DAILY FOR 5 TO 10 DAYS OR UNTIL HEALED FOR FEVER BLISTER. 90 tablet 3   aspirin EC 81 MG tablet Take 1 tablet (81 mg total) by mouth daily. 90 tablet 3   atorvastatin (LIPITOR) 40 MG tablet Take 1 tablet (40 mg total) by mouth daily. 90 tablet 3   calcium carbonate (OSCAL) 1500 (600 Ca) MG TABS tablet Take 1 tablet by mouth daily.      cetirizine (ZYRTEC) 10 MG tablet TAKE 1 TABLET BY MOUTH ONCE A DAY 90 tablet 0   Cholecalciferol (VITAMIN D3) 2000 units capsule Take 1 capsule (2,000 Units total) by mouth daily.     dicyclomine (BENTYL) 10 MG capsule TAKE 1 CAPSULE BY MOUTH 4 TIMES A DAY BEFORE MEALS AND AT BEDTIME. 90 capsule 1   fluticasone (FLONASE) 50 MCG/ACT nasal spray PLACE 2 SPRAYS INTO BOTH NOSTRILS DAILY 16 g 0   furosemide (LASIX) 20 MG tablet Take 1 tablet (20 mg total) by mouth every other day. 45 tablet 3   lisinopril (ZESTRIL) 40 MG tablet Take 1 tablet (40 mg total) by mouth daily. 90 tablet 3   methocarbamol (ROBAXIN) 500 MG tablet TAKE 1 TABLET FOUR TIMES DAILY AS NEEDED FOR MUSCLE SPASMS (PAIN) 90 tablet 3   Multiple Vitamins-Minerals (CENTRUM  SILVER ADULT 50+ PO) Take 1 tablet by mouth daily.     triamcinolone cream (KENALOG) 0.5 % Apply 1 application topically 2 (two) times daily. To affected areas, for up to 2 weeks. 30 g 0   warfarin (COUMADIN) 5 MG tablet TAKE 1/2 TO 1 TABLET BY MOUTH DAILY AS DIRECTED BY COUMADIN CLINIC 65 tablet 3   metoprolol succinate (TOPROL-XL) 100 MG 24 hr tablet Take 1.5 tablets (150 mg total) by mouth daily. Take with or immediately following a meal. 135 tablet 3   No current facility-administered medications for this visit.     Family History    Family History  Problem Relation Age of Onset   Hypothyroidism Sister    Gallbladder disease Sister    Coronary artery disease Other    Diabetes Mother    Hypertension Mother  Heart disease Father    Stroke Father    Breast cancer Neg Hx    She indicated that her mother is deceased. She indicated that her father is deceased. She indicated that her maternal grandmother is deceased. She indicated that her maternal grandfather is deceased. She indicated that her paternal grandmother is deceased. She indicated that her paternal grandfather is deceased. She indicated that the status of her neg hx is unknown. She indicated that the status of her other is unknown.  Social History    Social History   Socioeconomic History   Marital status: Divorced    Spouse name: Not on file   Number of children: 0   Years of education: 12   Highest education level: 12th grade  Occupational History   Occupation: Control and instrumentation engineer    Comment: retired  Tobacco Use   Smoking status: Never   Smokeless tobacco: Never  Scientific laboratory technician Use: Never used  Substance and Sexual Activity   Alcohol use: No   Drug use: No   Sexual activity: Not on file  Other Topics Concern   Not on file  Social History Narrative   Not on file   Social Determinants of Health   Financial Resource Strain: Low Risk  (12/27/2021)   Overall Financial Resource Strain (CARDIA)     Difficulty of Paying Living Expenses: Not hard at all  Food Insecurity: No Food Insecurity (12/27/2021)   Hunger Vital Sign    Worried About Running Out of Food in the Last Year: Never true    [place] in the Last Year: Never true  Transportation Needs: No Transportation Needs (12/27/2021)   PRAPARE - Hydrologist (Medical): No    Lack of Transportation (Non-Medical): No  Physical Activity: Sufficiently Active (12/27/2021)   Exercise Vital Sign    Days of Exercise per Week: 4 days    Minutes of Exercise per Session: 40 min  Stress: No Stress Concern Present (12/27/2021)   [place]    Feeling of Stress : Only a little  Social Connections: Moderately Isolated (12/27/2021)   Social Connection and Isolation Panel [NHANES]    Frequency of Communication with Friends and Family: More than three times a week    Frequency of Social Gatherings with Friends and Family: Twice a week    Attends Religious Services: More than 4 times per year    Active Member of [employer] or Organizations: No    Attends Archivist Meetings: Never    Marital Status: Divorced  Human resources officer Violence: Not At Risk (12/27/2021)   Humiliation, Afraid, Rape, and Kick questionnaire    Fear of Current or Ex-Partner: No    Emotionally Abused: No    Physically Abused: No    Sexually Abused: No     Review of Systems    General:  No chills, fever, night sweats or weight changes.  Cardiovascular:  No chest pain, dyspnea on exertion, edema, orthopnea, palpitations, paroxysmal nocturnal dyspnea. Dermatological: No rash, lesions/masses Respiratory: No cough, dyspnea Urologic: No hematuria, dysuria Abdominal:   No nausea, vomiting, diarrhea, bright red blood per rectum, melena, or hematemesis Neurologic:  No visual changes, wkns, changes in mental status. All other systems reviewed and are otherwise negative except as noted  above.     Physical Exam    VS:  BP 122/64   Pulse 78   Ht 5' 4"$  (1.626 m)  Wt 168 lb 13.9 oz (76.6 kg)   BMI 28.99 kg/m  , BMI Body mass index is 28.99 kg/m.     GEN: Well nourished, well developed, in no acute distress. HEENT: normal. Neck: Supple, no JVD, carotid bruits, or masses. Cardiac: irregular RRR, crisp click noted at LSB and Apex, no murmurs, rubs, or gallops. No clubbing, cyanosis, edema.  Radials/DP/PT 2+ and equal bilaterally.  Respiratory:  Respirations regular and unlabored, clear to auscultation bilaterally. GI: Soft, nontender, nondistended, BS + x 4. MS: no deformity or atrophy. Skin: warm and dry, no rash. Neuro:  Strength and sensation are intact. Psych: Normal affect.  Accessory Clinical Findings    ECG personally reviewed by me today-sinus rhythm with sinus arrhythmia first-degree AV block heart rate of 78 bpm- No acute changes  Lab Results  Component Value Date   WBC 5.3 06/03/2022   HGB 13.6 06/03/2022   HCT 40.9 06/03/2022   MCV 85.9 06/03/2022   PLT 189 06/03/2022   Lab Results  Component Value Date   CREATININE 0.85 06/03/2022   BUN 13 06/03/2022   NA 141 06/03/2022   K 4.5 06/03/2022   CL 104 06/03/2022   CO2 29 06/03/2022   Lab Results  Component Value Date   ALT 12 06/03/2022   AST 19 06/03/2022   ALKPHOS 106 01/23/2017   BILITOT 0.9 06/03/2022   Lab Results  Component Value Date   CHOL 162 06/03/2022   HDL 64 06/03/2022   LDLCALC 80 06/03/2022   LDLDIRECT 192.5 11/04/2010   TRIG 94 06/03/2022   CHOLHDL 2.5 06/03/2022    Lab Results  Component Value Date   HGBA1C 5.6 06/03/2022   CHA2DS2-VASc Score = 3 3 This indicates a 3.2% annual risk of stroke.3.2%  The patient's score is based upon:( Age, HTN, Female)  CHF History: 0 HTN History: 1 Diabetes History: 0 Stroke History: 0 Vascular Disease History: 0 Age Score: 1 Gender Score: 1     Review of Prior Studies: Echocardiogram 11/12/2017  Left ventricle: The  cavity size was normal. Wall thickness was    normal. Systolic function was normal. The estimated ejection    fraction was in the range of 55% to 60%. Wall motion was normal;    there were no regional wall motion abnormalities. Doppler    parameters are consistent with high ventricular filling pressure.  - Aortic valve: There was trivial regurgitation.  - Mitral valve: A mechanical prosthesis was present. Valve area by    continuity equation (using LVOT flow): 1.71 cm^2.  - Left atrium: The atrium was severely dilated.  - Right ventricle: The cavity size was mildly dilated.  - Pulmonary arteries: Systolic pressure was mildly to moderately    increased.    Assessment & Plan   1.  Status post mitral valve replacement: Completed 1998 in the setting of rheumatic heart disease.  She remains on Coumadin and is being followed in the [place] clinic with last INR of 3.0.  No changes to her medication regimen or additional labs at this time.  2.  Hypertension: Initially blood pressure was elevated at 148/86, but is now down to 124/68 on recheck in the clinic room.  Continue current medication regimen without changes or adjustments.  Labs are followed by PCP  3.  Chronic atrial fibrillation: Heart rate is currently well-controlled on metoprolol, she is having some racing heart rate at night when she is lying in bed.  I have asked her to decrease her caffeine intake  is much as she can but to do this slowly to avoid significant headache.  She will try and cut down on her Dr Malachi Bonds in the afternoon.  If this continues persistently, may need to increase her metoprolol dose.  Will see her on follow-up and discuss this further unless she calls [place] sooner for more symptoms.  Continue Coumadin dosing as directed.  Current medicines are reviewed at length with the patient today.  I have spent 25 min's  dedicated to the care of this patient on the date of this encounter to include pre-visit review of records,  assessment, management and diagnostic testing,with shared decision making.   Signed, Phill Myron. [place] Pugh, ANP, AACC   08/01/2022 10:08 AM      Office [phone] Fax [phone]  Notice: This dictation was prepared with Dragon dictation along with smaller phrase technology. Any transcriptional errors that result from this process are unintentional and may not be corrected upon review.

## 2022-07-30 ENCOUNTER — Ambulatory Visit: Payer: Medicare HMO | Attending: Cardiology

## 2022-07-30 DIAGNOSIS — Z9889 Other specified postprocedural states: Secondary | ICD-10-CM

## 2022-07-30 DIAGNOSIS — I482 Chronic atrial fibrillation, unspecified: Secondary | ICD-10-CM | POA: Diagnosis not present

## 2022-07-30 DIAGNOSIS — Z5181 Encounter for therapeutic drug level monitoring: Secondary | ICD-10-CM

## 2022-07-30 DIAGNOSIS — I4891 Unspecified atrial fibrillation: Secondary | ICD-10-CM

## 2022-07-30 DIAGNOSIS — I059 Rheumatic mitral valve disease, unspecified: Secondary | ICD-10-CM

## 2022-07-30 LAB — POCT INR: INR: 3.3 — AB (ref 2.0–3.0)

## 2022-07-30 NOTE — Patient Instructions (Signed)
Continue same dosage 1/2 a tablet daily except for 1 tablet on Mondays, Wednesdays and Fridays.  - Recheck INR in 5 weeks

## 2022-07-31 ENCOUNTER — Encounter: Payer: Self-pay | Admitting: "Endocrinology

## 2022-07-31 ENCOUNTER — Ambulatory Visit: Payer: Medicare HMO | Admitting: "Endocrinology

## 2022-07-31 VITALS — BP 146/78 | HR 68 | Ht 64.0 in | Wt 171.6 lb

## 2022-07-31 DIAGNOSIS — E042 Nontoxic multinodular goiter: Secondary | ICD-10-CM

## 2022-07-31 DIAGNOSIS — M8080XA Other osteoporosis with current pathological fracture, unspecified site, initial encounter for fracture: Secondary | ICD-10-CM | POA: Diagnosis not present

## 2022-07-31 NOTE — Progress Notes (Signed)
07/31/2022      Endocrinology Consult Note  Past Medical History:  Diagnosis Date   Anticoagulated on warfarin    Arthritis    Heart murmur    History of cardiac catheterization    a. 2008 Cath: nl cors.   History of hyperthyroidism    a. 2003--  PTU [place]   Hypercholesterolemia    Hyperlipidemia    Multinodular goiter    Permanent atrial fibrillation ([place])    a. Dx 1997; b. CHA2DS2VASc = 3-->warfarin (s/p MVR as well); c. 06/2013 Holter: HRs freq in low 100's to 1-teens w/ max rate 179-->beta blocker increased.   Pulmonary hypertension, mild (HCC)    Rheumatic heart disease    S/P MVR (mitral valve replacement)    a. 1997 s/p SJM mechanical MVR 2/2 Sev MS; b. chronic warfarin; c. 10/2017 Echo: EF 55-60%, no rwma, triv AI. Nl fxn of MV prosthesis. Sev dil LA. Mildly to mod increased PA pressures.   Past Surgical History:  Procedure Laterality Date   ANTERIOR CERVICAL DECOMP/DISCECTOMY FUSION  12-21-2001   C5 --  C6   BREAST BIOPSY Left 11/03/2016   Procedure: BREAST BIOPSY WITH NEEDLE LOCALIZATION;  Surgeon: Robert Bellow, MD;  Location: [facility] ORS;  Service: General;  Laterality: Left;   BREAST EXCISIONAL BIOPSY Left 2018   CARDIAC CATHETERIZATION  12-28-2006  DR [facility]   NORMAL CORONARY ARTERIES   CARPAL TUNNEL RELEASE Right 01-20-2007   COLONOSCOPY  2015   COLONOSCOPY WITH PROPOFOL N/A 09/10/2021   Procedure: COLONOSCOPY WITH PROPOFOL;  Surgeon: Lucilla Lame, MD;  Location: [facility] END[facility];  Service: Endoscopy;  Laterality: N/A;   ERCP  08/15/2011   Procedure: ENDOSCOPIC RETROGRADE CHOLANGIOPANCREATOGRAPHY (ERCP);  Surgeon: Jeryl Columbia, MD;  Location: [facility] ENDOSCOPY;  Service: Endoscopy;  Laterality: N/A;   ERCP     MULTIPLE--  INCLUDING STENTS, SPHINTEROTOMY'S  STONE REMOVAL    ESOPHAGOGASTRODUODENOSCOPY (EGD) WITH PROPOFOL  09/10/2021   Procedure: ESOPHAGOGASTRODUODENOSCOPY (EGD) WITH PROPOFOL;  Surgeon: Lucilla Lame, MD;  Location: [facility] ENDOSCOPY;  Service: Endoscopy;;   FEMUR IM NAIL Right 04/26/2014   Procedure: INTRAMEDULLARY (IM) NAIL FEMORAL;  Surgeon: Alta Corning, MD;  Location: [place];  Service: Orthopedics;  Laterality: Right;   I & D EXTREMITY Right 02/07/2013   Procedure: IRRIGATION AND DEBRIDEMENT OF RIGHT LOWER LEG WITH PLACEMENT OF ACELL AND WOULND VAC;  Surgeon: Theodoro Kos, DO;  Location: [place];  Service: Plastics;  Laterality: Right;   LAPAROSCOPIC CHOLECYSTECTOMY  04-08-2002   MITRAL VALVE REPLACEMENT  1997   [facility]   ORIF ANKLE FRACTURE Right 11/05/2012   Procedure: OPEN REDUCTION INTERNAL FIXATION (ORIF) ANKLE FRACTURE only operating on right;  Surgeon: Alta Corning, MD;  Location: [place];  Service: Orthopedics;  Laterality: Right;   ORIF RIGHT ULNAR FX W/ ILIAC CREST BONE GRAFT  10-06-2008   ORIF WRIST FRACTURE Right 04/26/2014   Procedure: OPEN REDUCTION INTERNAL FIXATION (ORIF) WRIST FRACTURE;  Surgeon: Alta Corning, MD;  Location: [place];  Service: Orthopedics;  Laterality: Right;   TRANSTHORACIC ECHOCARDIOGRAM  06-29-2009  dr wall   normal lvsf/ ef 55-60%/  normal bileaflet mechanical  mvr/ moderated dilated la/ moderate tr   WRIST ARTHROSCOPY Right 12-10-2007   debridement and ulnar shortening osteotomy w/ plate   Social History   Socioeconomic History   Marital status: Divorced    Spouse name: Not on file   Number of children: 0   Years of education: 12   Highest education level: 12th grade  Occupational History   Occupation: Control and instrumentation engineer    Comment: retired  Tobacco Use   Smoking status: Never   Smokeless tobacco: Never  Scientific laboratory technician Use: Never used  Substance and Sexual Activity   Alcohol use: No   Drug use: No   Sexual activity: Not on file  Other Topics Concern   Not on file  Social History Narrative   Not on file   Social Determinants of Health   Financial Resource Strain: Low Risk  (12/27/2021)   Overall Financial  Resource Strain (CARDIA)    Difficulty of Paying Living Expenses: Not hard at all  Food Insecurity: No Food Insecurity (12/27/2021)   Hunger Vital Sign    Worried About Running Out of Food in the Last Year: Never true    [place] in the Last Year: Never true  Transportation Needs: No Transportation Needs (12/27/2021)   PRAPARE - Hydrologist (Medical): No    Lack of Transportation (Non-Medical): No  Physical Activity: Sufficiently Active (12/27/2021)   Exercise Vital Sign    Days of Exercise per Week: 4 days    Minutes of Exercise per Session: 40 min  Stress: No Stress Concern Present (12/27/2021)   [place]    Feeling of Stress : Only a little  Social Connections: Moderately Isolated (12/27/2021)   Social Connection and Isolation Panel [NHANES]    Frequency of Communication with Friends and Family: More than three times a week    Frequency of Social Gatherings with Friends and Family: Twice a week    Attends Religious Services: More than 4 times per year    Active Member of [employer] or Organizations: No    Attends Archivist Meetings: Never    Marital Status: Divorced   Outpatient Encounter Medications as of 07/31/2022  Medication Sig   acyclovir (ZOVIRAX) 400 MG tablet TAKE 1 TABLET BY MOUTH 3 TIMES DAILY FOR 5 TO 10 DAYS OR UNTIL HEALED FOR FEVER BLISTER.   alendronate (FOSAMAX) 35 MG tablet TAKE 1 TABLET BY MOUTH EVERY 7 DAYS. TAKE WITH A FULL GLASS OF WATER ON AN EMPTY STOMACH. (Patient not taking: Reported on 07/31/2022)   amitriptyline (ELAVIL) 50 MG tablet Take 50 mg by mouth at bedtime as needed for sleep.  (Patient not taking: Reported on 07/31/2022)   aspirin EC 81 MG tablet Take 1 tablet (81 mg total) by mouth daily.   atorvastatin (LIPITOR) 40 MG tablet Take 1 tablet (40 mg total) by mouth daily.   calcium carbonate (OSCAL) 1500 (600 Ca) MG TABS tablet Take 1 tablet by mouth  daily.    cetirizine (ZYRTEC) 10 MG tablet TAKE 1 TABLET BY MOUTH ONCE A DAY   Cholecalciferol (VITAMIN D3) 2000 units capsule Take 1 capsule (2,000 Units total) by mouth daily.   dicyclomine (BENTYL) 10 MG capsule TAKE 1 CAPSULE BY MOUTH 4 TIMES A DAY BEFORE MEALS AND AT BEDTIME.   fluticasone (FLONASE) 50 MCG/ACT nasal spray PLACE 2 SPRAYS INTO BOTH NOSTRILS DAILY   furosemide (LASIX) 20 MG tablet Take  1 tablet (20 mg total) by mouth every other day.   lisinopril (ZESTRIL) 40 MG tablet Take 1 tablet (40 mg total) by mouth daily.   methocarbamol (ROBAXIN) 500 MG tablet TAKE 1 TABLET FOUR TIMES DAILY AS NEEDED FOR MUSCLE SPASMS (PAIN)   metoprolol succinate (TOPROL-XL) 100 MG 24 hr tablet Take 1.5 tablets (150 mg total) by mouth daily. Take with or immediately following a meal.   Multiple Vitamins-Minerals (CENTRUM SILVER ADULT 50+ PO) Take 1 tablet by mouth daily.   triamcinolone cream (KENALOG) 0.5 % Apply 1 application topically 2 (two) times daily. To affected areas, for up to 2 weeks.   warfarin (COUMADIN) 5 MG tablet TAKE 1/2 TO 1 TABLET BY MOUTH DAILY AS DIRECTED BY COUMADIN CLINIC   No facility-administered encounter medications on file as of 07/31/2022.   ALLERGIES: No Known Allergies  VACCINATION STATUS: Immunization History  Administered Date(s) Administered   Fluad Quad(high Dose 65+) 03/23/2022   Influenza, High Dose Seasonal PF 03/02/2019, 03/19/2020, 03/19/2021   Influenza-Unspecified 03/17/2016, 03/30/2018, 03/16/2020   PFIZER(Purple Top)SARS-COV-2 Vaccination 08/24/2019, 09/14/2019, 03/20/2020   PNEUMOCOCCAL CONJUGATE-20 06/06/2021   Respiratory Syncytial Virus Vaccine,Recomb Aduvanted(Arexvy) 03/23/2022   Zoster, Live 04/29/2016     HPI   [name] is 67 y.o. female who presents today with a medical history as above. she is being seen in consultation for osteoporosis requested by Olin Hauser, DO.  Patient was diagnosed with osteoporosis   approximately 1/2  years ago. She was started on alendronate 35 mg p.o. weekly, however reportedly this was complicated dental problems which led to loss of some teeth.   Patient gives history of multiple fractures of lower extremities in the past.  She broke her ankles in 2014.  Broke femur in 2018.  She admits to loss of height, unquantified.  She reports disequilibrium unspecified.  Her other medical problems include hyperlipidemia, hypertension on treatment.  She has also medical history of multinodular goiter which was previously worked up.  She is not on thyroid hormone or antithyroid intervention at this time.  I reviewed bone density done for her in September 2023 which showed a T-score of -2.6 on left hip. She denies any medical history of malignancy. She is on regular dose of calcium carbonate, and vitamin D3 2000 units daily.   -She is on Coumadin, does not consume optimal levels of dark green vegetables. She mostly leads sedentary lifestyle. -Her most recent labs show calcium of 9.6, normal thyroid function test.  She has normal renal function.  She went into menopause in late 6s.     Review of Systems  Constitutional: + Minimally fluctuating body weight, + fatigue, no subjective hyperthermia, no subjective hypothermia Eyes: no blurry vision, no xerophthalmia ENT: no sore throat, no nodules palpated in throat, no dysphagia/odynophagia, no hoarseness Cardiovascular: no Chest Pain, no Shortness of Breath, no palpitations, no leg swelling Respiratory: no cough, no SOB Gastrointestinal: no Nausea/Vomiting/Diarhhea Musculoskeletal: no muscle/joint aches Skin: no rashes Neurological: no tremors, no numbness, no tingling, no dizziness Psychiatric: no depression, no anxiety  Objective:    BP (!) 146/78 Comment: 144/84 L arm with manuel cuff  Pulse 68   Ht '5\' 4"'$  (1.626 m)   Wt 171 lb 9.6 oz (77.8 kg)   BMI 29.46 kg/m   Wt Readings from Last 3 Encounters:  07/31/22 171 lb 9.6  oz (77.8 kg)  06/10/22 163 lb (73.9 kg)  12/27/21 167 lb (75.8 kg)    Physical Exam  Constitutional:+ BMI of  29.46, not in acute distress, normal state of mind Eyes: PERRLA, EOMI, no exophthalmos ENT: moist mucous membranes, no thyromegaly, no cervical lymphadenopathy Cardiovascular: normal precordial activity, Regular Rate and Rhythm, no Murmur/Rubs/Gallops Respiratory:  adequate breathing efforts, no gross chest deformity, Clear to auscultation bilaterally Gastrointestinal: abdomen soft, Non -tender, No distension, Bowel Sounds present Musculoskeletal: no gross deformities, strength intact in all four extremities Skin: moist, warm, no rashes Neurological: no tremor with outstretched hands, Deep tendon reflexes normal in all four extremities.  CMP ( most recent) CMP     Component Value Date/Time   NA 141 06/03/2022 0808   NA 142 09/24/2021 0812   K 4.5 06/03/2022 0808   CL 104 06/03/2022 0808   CO2 29 06/03/2022 0808   GLUCOSE 96 06/03/2022 0808   BUN 13 06/03/2022 0808   BUN 13 09/24/2021 0812   CREATININE 0.85 06/03/2022 0808   CALCIUM 9.6 06/03/2022 0808   PROT 7.4 06/03/2022 0808   PROT 7.1 10/01/2016 0919   ALBUMIN 3.9 01/23/2017 1440   ALBUMIN 3.8 10/01/2016 0919   AST 19 06/03/2022 0808   ALT 12 06/03/2022 0808   ALKPHOS 106 01/23/2017 1440   BILITOT 0.9 06/03/2022 0808   BILITOT 0.7 10/01/2016 0919   GFRNONAA 62 11/28/2020 0801   GFRAA 72 11/28/2020 0801     Diabetic Labs (most recent): Lab Results  Component Value Date   HGBA1C 5.6 06/03/2022   HGBA1C 5.6 05/30/2021   HGBA1C 5.4 05/25/2020     Lipid Panel ( most recent) Lipid Panel     Component Value Date/Time   CHOL 162 06/03/2022 0808   CHOL 157 10/01/2016 0919   TRIG 94 06/03/2022 0808   HDL 64 06/03/2022 0808   HDL 46 10/01/2016 0919   CHOLHDL 2.5 06/03/2022 0808   VLDL 20 06/05/2015 0926   LDLCALC 80 06/03/2022 0808   LDLDIRECT 192.5 11/04/2010 1131   LABVLDL 22 10/01/2016 0919       Lab Results  Component Value Date   TSH 1.09 06/03/2022   TSH 0.78 05/30/2021   TSH 0.40 11/28/2020   TSH 0.24 (L) 05/25/2020   TSH 0.43 06/01/2018   TSH 0.59 05/26/2017   TSH 0.71 12/02/2016   TSH 0.80 04/19/2009   TSH 0.50 04/17/2008   TSH 0.56 10/12/2007   FREET4 1.1 06/03/2022   FREET4 1.0 05/30/2021   FREET4 1.3 11/28/2020   FREET4 0.9 06/01/2018   FREET4 1.2 12/02/2016   FREET4 0.9 10/12/2007   FREET4 0.8 09/14/2006    Bone density from March 10, 2022 AP Spine L1-L4 03/10/2022 68.5 Normal -0.7 1.107 g/cm2 - -   Left Femur Neck 03/10/2022 68.5 Osteoporosis -2.6 0.678 g/cm2 - -   ASSESSMENT: The BMD measured at Femur Neck is 0.678 g/cm2 with a T-score of -2.6. This patient is considered osteoporotic according to [facility]) criteria. The scan quality is good.  Assessment: 1. Osteoporosis  Plan: 1. Osteoporosis - likely postmenopausal  - Discussed about increased risk of fracture, depending on the T score, greatly increased when the T score is lower than -2.5.   We reviewed her DEXA scans together, and I explained that based on the T scores, she has an increased risk for fractures, especially in light of her previous fragility fracture reports. - We discussed about the different medication classes, benefits and side effects including atypical fractures and ONJ.  She currently has no dental procedures planned, she is edentulous.  It is still unsafe to resume her oral  bisphosphonate therapy.  Her best option is to go on Prolia. I discussed and initiated Prolia 60 mg subcutaneously every 6 months to be administered in office.  Pt was given reading information about osteoporosis and treatment options.    - we reviewed her dietary and supplemental calcium and vitamin D intake, which I believe are adequate.   - discussed fall precautions   - we discussed about maintaining a good amount of protein in her diet.    She will return for Prolia  injection when her prescription is ready. She will have CMP repeated before her next visit in 6 months.  Regarding her multinodular goiter, she was previously completely worked up.  She has euthyroid state based on recent thyroid function test.  She will not need intervention at this time.  - I advised patient to maintain close follow up with Olin Hauser, DO for primary care needs.  - Time spent with the patient: 45 minutes, of which >50% was spent in obtaining information about her symptoms, reviewing her previous labs, evaluations, and treatments, counseling her about her osteoporosis, multinodular goiter, and developing a plan to confirm the diagnosis and long term treatment as necessary.  [name] participated in the discussions, expressed understanding, and voiced agreement with the above plans.  All questions were answered to her satisfaction. she is encouraged to contact clinic should she have any questions or concerns prior to her return visit.  Follow up plan: Return in about 6 months (around 01/29/2023), or Prolia soon as as possible and during NV, for Prolia During NV, F/U with Pre-visit Labs.   Glade Lloyd, MD [facility] [address] [place], [place] [address] Phone: [phone]  Fax: [phone]     07/31/2022, 2:24 PM  This note was partially dictated with voice recognition software. Similar sounding words can be transcribed inadequately or may not  be corrected upon review.

## 2022-08-01 ENCOUNTER — Other Ambulatory Visit: Payer: Self-pay

## 2022-08-01 ENCOUNTER — Encounter: Payer: Self-pay | Admitting: Adult Health

## 2022-08-01 ENCOUNTER — Other Ambulatory Visit (HOSPITAL_COMMUNITY): Payer: Self-pay

## 2022-08-01 ENCOUNTER — Ambulatory Visit: Payer: Medicare HMO | Attending: Adult Health | Admitting: Adult Health

## 2022-08-01 VITALS — BP 122/64 | HR 78 | Ht 64.0 in | Wt 168.9 lb

## 2022-08-01 DIAGNOSIS — I471 Supraventricular tachycardia, unspecified: Secondary | ICD-10-CM | POA: Diagnosis not present

## 2022-08-01 DIAGNOSIS — I482 Chronic atrial fibrillation, unspecified: Secondary | ICD-10-CM

## 2022-08-01 DIAGNOSIS — I10 Essential (primary) hypertension: Secondary | ICD-10-CM | POA: Diagnosis not present

## 2022-08-01 DIAGNOSIS — M8080XA Other osteoporosis with current pathological fracture, unspecified site, initial encounter for fracture: Secondary | ICD-10-CM

## 2022-08-01 MED ORDER — PROLIA 60 MG/ML ~~LOC~~ SOSY
60.0000 mg | PREFILLED_SYRINGE | SUBCUTANEOUS | 1 refills | Status: DC
  Filled 2022-08-01 (×2): qty 1, 180d supply, fill #0

## 2022-08-01 MED ORDER — METOPROLOL SUCCINATE ER 100 MG PO TB24: 150.0000 mg | ORAL_TABLET | Freq: Every day | ORAL | 3 refills | Status: DC

## 2022-08-01 NOTE — Patient Instructions (Signed)
Medication Instructions:  No Changes *If you need a refill on your cardiac medications before your next appointment, please call your pharmacy*   Lab Work: No Labs If you have labs (blood work) drawn today and your tests are completely normal, you will receive your results only by: [place] (if you have MyChart) OR A paper copy in the mail If you have any lab test that is abnormal or we need to change your treatment, we will call you to review the results.   Testing/Procedures: No Testing   Follow-Up: At [facility], you and your health needs are our priority.  As part of our continuing mission to provide you with exceptional heart care, we have created designated Provider Care Teams.  These Care Teams include your primary Cardiologist (physician) and Advanced Practice Providers (APPs -  Physician Assistants and Nurse Practitioners) who all work together to provide you with the care you need, when you need it.  We recommend signing up for the patient portal called "MyChart".  Sign up information is provided on this After Visit Summary.  MyChart is used to connect with patients for Virtual Visits (Telemedicine).  Patients are able to view lab/test results, encounter notes, upcoming appointments, etc.  Non-urgent messages can be sent to your provider as well.   To learn more about what you can do with MyChart, go to [email or web address].    Your next appointment:   1 year(s)  Provider:   Kirk Ruths, MD

## 2022-08-05 ENCOUNTER — Other Ambulatory Visit (HOSPITAL_COMMUNITY): Payer: Self-pay

## 2022-08-07 ENCOUNTER — Other Ambulatory Visit (HOSPITAL_COMMUNITY): Payer: Self-pay

## 2022-08-14 ENCOUNTER — Ambulatory Visit (INDEPENDENT_AMBULATORY_CARE_PROVIDER_SITE_OTHER): Payer: Medicare HMO

## 2022-08-14 VITALS — BP 122/64 | HR 88 | Ht 64.0 in | Wt 170.4 lb

## 2022-08-14 DIAGNOSIS — M8080XA Other osteoporosis with current pathological fracture, unspecified site, initial encounter for fracture: Secondary | ICD-10-CM | POA: Diagnosis not present

## 2022-08-14 MED ORDER — DENOSUMAB 60 MG/ML ~~LOC~~ SOSY: 60.0000 mg | PREFILLED_SYRINGE | Freq: Once | SUBCUTANEOUS | Status: DC

## 2022-08-14 NOTE — Addendum Note (Signed)
Addended by: Ellin Saba on: 08/14/2022 01:55 PM   Modules accepted: Orders

## 2022-08-14 NOTE — Progress Notes (Signed)
Pt seen for nurse visit for prolia injection. Pt brought her prolia with her. Prolia 59m/ml was given SQ in R quadrant of abdomen without any difficulty. Lot # [id number] Exp. Date 12/20/2024. Pt waited the required 15 minutes post injection, no adverse reactions noted.

## 2022-08-28 ENCOUNTER — Telehealth: Payer: Self-pay | Admitting: *Deleted

## 2022-08-28 NOTE — Telephone Encounter (Signed)
Left message for patients sister, the verification of chronic condition paperwork is complete and placed at the front desk for pick up.

## 2022-09-03 ENCOUNTER — Ambulatory Visit: Payer: Medicare HMO | Attending: Cardiology

## 2022-09-03 DIAGNOSIS — I059 Rheumatic mitral valve disease, unspecified: Secondary | ICD-10-CM

## 2022-09-03 DIAGNOSIS — Z5181 Encounter for therapeutic drug level monitoring: Secondary | ICD-10-CM

## 2022-09-03 DIAGNOSIS — Z9889 Other specified postprocedural states: Secondary | ICD-10-CM

## 2022-09-03 DIAGNOSIS — I4891 Unspecified atrial fibrillation: Secondary | ICD-10-CM

## 2022-09-03 DIAGNOSIS — I482 Chronic atrial fibrillation, unspecified: Secondary | ICD-10-CM | POA: Diagnosis not present

## 2022-09-03 LAB — POCT INR: INR: 2.1 (ref 2.0–3.0)

## 2022-09-03 NOTE — Patient Instructions (Signed)
Description   Take 1.5 tablets today, then resume same dosage 1/2 a tablet daily except for 1 tablet on Mondays, Wednesdays and Fridays.  - Recheck INR in 3 weeks

## 2022-09-24 ENCOUNTER — Ambulatory Visit: Payer: Medicare HMO | Attending: Cardiology

## 2022-09-24 DIAGNOSIS — Z9889 Other specified postprocedural states: Secondary | ICD-10-CM | POA: Diagnosis not present

## 2022-09-24 DIAGNOSIS — I4891 Unspecified atrial fibrillation: Secondary | ICD-10-CM | POA: Diagnosis not present

## 2022-09-24 DIAGNOSIS — I482 Chronic atrial fibrillation, unspecified: Secondary | ICD-10-CM | POA: Diagnosis not present

## 2022-09-24 DIAGNOSIS — Z5181 Encounter for therapeutic drug level monitoring: Secondary | ICD-10-CM | POA: Diagnosis not present

## 2022-09-24 DIAGNOSIS — I059 Rheumatic mitral valve disease, unspecified: Secondary | ICD-10-CM | POA: Diagnosis not present

## 2022-09-24 LAB — POCT INR: INR: 2.2 (ref 2.0–3.0)

## 2022-09-24 NOTE — Patient Instructions (Signed)
Description   Take 1.5 tablets today, then start taking 1/2 a tablet daily except for 1 tablet on Mondays, Wednesdays, Fridays, and Sundays.  - Recheck INR in 3 weeks

## 2022-09-25 ENCOUNTER — Other Ambulatory Visit: Payer: Self-pay | Admitting: Family Medicine

## 2022-09-25 DIAGNOSIS — B001 Herpesviral vesicular dermatitis: Secondary | ICD-10-CM

## 2022-09-25 NOTE — Telephone Encounter (Signed)
Unable to refill per protocol, Rx request is too soon. Last refill 06/10/22 for 90 and 3 refills.  Requested Prescriptions  Pending Prescriptions Disp Refills   acyclovir (ZOVIRAX) 400 MG tablet [Pharmacy Med Name: ACYCLOVIR 400 MG Tablet] 90 tablet 11    Sig: TAKE 1 TABLET THREE TIMES DAILY FOR 5 TO 10 DAYS OR UNTIL HEALED FOR FEVER BLISTER.     Antimicrobials:  Antiviral Agents - Anti-Herpetic Passed - 09/25/2022  3:33 AM      Passed - Valid encounter within last 12 months    Recent Outpatient Visits           3 months ago Annual physical exam   [facility] [place], Devonne Doughty, DO   1 year ago Annual physical exam   [facility] [place], Devonne Doughty, DO   1 year ago [facility], [facility] Olin Hauser, DO   2 years ago Annual physical exam   [facility] Olin Hauser, DO   2 years ago Elevated hemoglobin A1c   [facility] Olin Hauser, DO       Future Appointments             In 8 months [place], Devonne Doughty, [facility], [facility]

## 2022-10-03 DIAGNOSIS — H2513 Age-related nuclear cataract, bilateral: Secondary | ICD-10-CM | POA: Diagnosis not present

## 2022-10-03 DIAGNOSIS — H18413 Arcus senilis, bilateral: Secondary | ICD-10-CM | POA: Diagnosis not present

## 2022-10-03 DIAGNOSIS — H25043 Posterior subcapsular polar age-related cataract, bilateral: Secondary | ICD-10-CM | POA: Diagnosis not present

## 2022-10-03 DIAGNOSIS — H25013 Cortical age-related cataract, bilateral: Secondary | ICD-10-CM | POA: Diagnosis not present

## 2022-10-03 DIAGNOSIS — H2511 Age-related nuclear cataract, right eye: Secondary | ICD-10-CM | POA: Diagnosis not present

## 2022-10-08 ENCOUNTER — Other Ambulatory Visit: Payer: Self-pay

## 2022-10-08 DIAGNOSIS — H25013 Cortical age-related cataract, bilateral: Secondary | ICD-10-CM | POA: Diagnosis not present

## 2022-10-08 DIAGNOSIS — H2513 Age-related nuclear cataract, bilateral: Secondary | ICD-10-CM | POA: Diagnosis not present

## 2022-10-08 DIAGNOSIS — H18413 Arcus senilis, bilateral: Secondary | ICD-10-CM | POA: Diagnosis not present

## 2022-10-08 DIAGNOSIS — H2511 Age-related nuclear cataract, right eye: Secondary | ICD-10-CM | POA: Diagnosis not present

## 2022-10-08 DIAGNOSIS — H25043 Posterior subcapsular polar age-related cataract, bilateral: Secondary | ICD-10-CM | POA: Diagnosis not present

## 2022-10-08 DIAGNOSIS — M8080XA Other osteoporosis with current pathological fracture, unspecified site, initial encounter for fracture: Secondary | ICD-10-CM

## 2022-10-08 MED ORDER — PROLIA 60 MG/ML ~~LOC~~ SOSY: 60.0000 mg | PREFILLED_SYRINGE | SUBCUTANEOUS | 1 refills | Status: DC

## 2022-10-15 ENCOUNTER — Ambulatory Visit: Payer: Medicare HMO | Attending: Cardiology

## 2022-10-15 DIAGNOSIS — Z5181 Encounter for therapeutic drug level monitoring: Secondary | ICD-10-CM

## 2022-10-15 DIAGNOSIS — I4891 Unspecified atrial fibrillation: Secondary | ICD-10-CM

## 2022-10-15 DIAGNOSIS — I059 Rheumatic mitral valve disease, unspecified: Secondary | ICD-10-CM

## 2022-10-15 DIAGNOSIS — I482 Chronic atrial fibrillation, unspecified: Secondary | ICD-10-CM

## 2022-10-15 DIAGNOSIS — Z9889 Other specified postprocedural states: Secondary | ICD-10-CM | POA: Diagnosis not present

## 2022-10-15 LAB — POCT INR: INR: 3.7 — AB (ref 2.0–3.0)

## 2022-10-15 NOTE — Patient Instructions (Signed)
Description   Start taking 1/2 a tablet daily except for 1 tablet on Mondays, Wednesdays, Fridays. Eat something green and leafy today. Recheck INR in 3 weeks

## 2022-10-17 ENCOUNTER — Other Ambulatory Visit: Payer: Self-pay | Admitting: Family Medicine

## 2022-10-17 DIAGNOSIS — J3089 Other allergic rhinitis: Secondary | ICD-10-CM

## 2022-10-17 MED ORDER — CETIRIZINE HCL 10 MG PO TABS
10.0000 mg | ORAL_TABLET | Freq: Every day | ORAL | 2 refills | Status: DC
Start: 2022-10-17 — End: 2024-04-07

## 2022-10-17 NOTE — Telephone Encounter (Signed)
Requested Prescriptions  Pending Prescriptions Disp Refills   cetirizine (ZYRTEC) 10 MG tablet 90 tablet 2    Sig: Take 1 tablet (10 mg total) by mouth daily.     Ear, Nose, and Throat:  Antihistamines 2 Passed - 10/17/2022  4:14 PM      Passed - Cr in normal range and within 360 days    Creat  Date Value Ref Range Status  06/03/2022 0.85 0.50 - 1.05 mg/dL Final   Creatinine, Urine  Date Value Ref Range Status  08/18/2007 145.6  Final         Passed - Valid encounter within last 12 months    Recent Outpatient Visits           4 months ago Annual physical exam   [name] Joanna Cords, DO   1 year ago Annual physical exam   [name] [name], Joanna Neat, DO   1 year ago [facility], [name]   [name] Joanna Cords, DO   2 years ago Annual physical exam   [name] [place], Joanna Neat, DO   2 years ago Elevated hemoglobin A1c   [name] Joanna Cords, DO       Future Appointments             In 7 months [name], Joanna Neat, DO [name], [name]

## 2022-10-17 NOTE — Telephone Encounter (Signed)
Medication Refill - Medication: cetirizine (ZYRTEC) 10 MG tablet   Has the patient contacted their pharmacy? yes (Agent: If no, request that the patient contact the pharmacy for the refill. If patient does not wish to contact the pharmacy document the reason why and proceed with request.) (Agent: If yes, when and what did the pharmacy advise?)contact pcp  Preferred Pharmacy (with phone number or street name):  [facility] - [place], [place] - [address] Phone: [phone]  Fax: [phone]     Has the patient been seen for an appointment in the last year OR does the patient have an upcoming appointment? yes  Agent: Please be advised that RX refills may take up to 3 business days. We ask that you follow-up with your pharmacy.

## 2022-10-20 ENCOUNTER — Other Ambulatory Visit: Payer: Self-pay | Admitting: Family Medicine

## 2022-10-21 NOTE — Telephone Encounter (Signed)
Requested by interface surescripts. Medication discontinued by PCP 08/01/22.  Requested Prescriptions  Refused Prescriptions Disp Refills   alendronate (FOSAMAX) 35 MG tablet [Pharmacy Med Name: ALENDRONATE SODIUM 35 MG Tablet] 12 tablet 3    Sig: TAKE 1 TABLET EVERY 7 DAYS. TAKE WITH A FULL GLASS OF WATER ON AN EMPTY STOMACH.     Endocrinology:  Bisphosphonates Failed - 10/20/2022  3:05 AM      Failed - Vitamin D in normal range and within 360 days    Vit D, 25-Hydroxy  Date Value Ref Range Status  05/31/2019 37 30 - 100 ng/mL Final    Comment:    Vitamin D Status         25-OH Vitamin D: . Deficiency:                    <20 ng/mL Insufficiency:             20 - 29 ng/mL Optimal:                 > or = 30 ng/mL . For 25-OH Vitamin D testing on patients on  D2-supplementation and patients for whom quantitation  of D2 and D3 fractions is required, the Quest[employer]ureD(TM) 25-OH VIT D, (D2,D3), LC/MS/MS is recommended: order  code 16109 (patients >102yrs). See Note 1 . Note 1 . For additional information, please refer to  http://education.QuestDiagnostics.com/faq/FAQ199  (This link is being provided for informational/ educational purposes only.)          Failed - Mg Level in normal range and within 360 days    Magnesium  Date Value Ref Range Status  08/24/2007 2.1  Final         Failed - Phosphate in normal range and within 360 days    Phosphorus  Date Value Ref Range Status  08/24/2007 5.6 (H)  Final         Passed - Ca in normal range and within 360 days    Calcium  Date Value Ref Range Status  06/03/2022 9.6 8.6 - 10.4 mg/dL Final   Calcium, Ion  Date Value Ref Range Status  10/06/2008 1.16 1.12 - 1.32 mmol/L Final         Passed - Cr in normal range and within 360 days    Creat  Date Value Ref Range Status  06/03/2022 0.85 0.50 - 1.05 mg/dL Final   Creatinine, Urine  Date Value Ref Range Status  08/18/2007 145.6  Final         Passed - eGFR is 30 or above  and within 360 days    GFR, Est African American  Date Value Ref Range Status  11/28/2020 72 > OR = 60 mL/min/1.43m2 Final   GFR, Est Non African American  Date Value Ref Range Status  11/28/2020 62 > OR = 60 mL/min/1.18m2 Final   GFR  Date Value Ref Range Status  10/06/2012 75.80 >60.00 mL/min Final   eGFR  Date Value Ref Range Status  06/03/2022 75 > OR = 60 mL/min/1.53m2 Final  09/24/2021 78 >59 mL/min/1.73 Final         Passed - Valid encounter within last 12 months    Recent Outpatient Visits           4 months ago Annual physical exam   [facility] Smitty Cords, DO   1 year ago Annual physical exam   [place] [facility] Smitty Cords, DO  1 year ago [facility], [employer]   [facility] [place], Netta Neat, DO   2 years ago Annual physical exam   [place] [facility] [place], Netta Neat, DO   2 years ago Elevated hemoglobin A1c   [place] [facility] Smitty Cords, DO       Future Appointments             In 7 months Althea Charon, Netta Neat, DO [place] [facility], [place]            Passed - Bone Mineral Density or Dexa Scan completed in the last 2 years

## 2022-11-05 ENCOUNTER — Ambulatory Visit: Payer: Medicare HMO | Attending: Cardiology

## 2022-11-05 DIAGNOSIS — Z9889 Other specified postprocedural states: Secondary | ICD-10-CM | POA: Diagnosis not present

## 2022-11-05 DIAGNOSIS — Z5181 Encounter for therapeutic drug level monitoring: Secondary | ICD-10-CM

## 2022-11-05 DIAGNOSIS — I482 Chronic atrial fibrillation, unspecified: Secondary | ICD-10-CM

## 2022-11-05 DIAGNOSIS — I4891 Unspecified atrial fibrillation: Secondary | ICD-10-CM | POA: Diagnosis not present

## 2022-11-05 DIAGNOSIS — I059 Rheumatic mitral valve disease, unspecified: Secondary | ICD-10-CM | POA: Diagnosis not present

## 2022-11-05 LAB — POCT INR: INR: 3.5 — AB (ref 2.0–3.0)

## 2022-11-05 NOTE — Patient Instructions (Signed)
Start taking 1/2 a tablet daily except for 1 tablet on Mondays, Wednesdays, Fridays.  Recheck INR in 3 weeks

## 2022-11-26 ENCOUNTER — Ambulatory Visit: Payer: Medicare HMO | Attending: Cardiology

## 2022-11-26 DIAGNOSIS — I482 Chronic atrial fibrillation, unspecified: Secondary | ICD-10-CM | POA: Diagnosis not present

## 2022-11-26 DIAGNOSIS — I059 Rheumatic mitral valve disease, unspecified: Secondary | ICD-10-CM | POA: Diagnosis not present

## 2022-11-26 DIAGNOSIS — I4891 Unspecified atrial fibrillation: Secondary | ICD-10-CM | POA: Diagnosis not present

## 2022-11-26 DIAGNOSIS — Z5181 Encounter for therapeutic drug level monitoring: Secondary | ICD-10-CM

## 2022-11-26 DIAGNOSIS — Z9889 Other specified postprocedural states: Secondary | ICD-10-CM

## 2022-11-26 LAB — POCT INR: INR: 4 — AB (ref 2.0–3.0)

## 2022-11-26 NOTE — Patient Instructions (Signed)
HOLD TODAY ONLY THEN CONTINUE taking 1/2 a tablet daily except for 1 tablet on Mondays, Wednesdays, Fridays.  Recheck INR in 3 weeks

## 2022-11-28 ENCOUNTER — Other Ambulatory Visit: Payer: Self-pay | Admitting: Cardiology

## 2022-11-28 DIAGNOSIS — I482 Chronic atrial fibrillation, unspecified: Secondary | ICD-10-CM

## 2022-12-17 ENCOUNTER — Ambulatory Visit: Payer: Medicare HMO | Attending: Cardiology

## 2022-12-17 DIAGNOSIS — I482 Chronic atrial fibrillation, unspecified: Secondary | ICD-10-CM

## 2022-12-17 DIAGNOSIS — I4891 Unspecified atrial fibrillation: Secondary | ICD-10-CM | POA: Diagnosis not present

## 2022-12-17 DIAGNOSIS — I059 Rheumatic mitral valve disease, unspecified: Secondary | ICD-10-CM

## 2022-12-17 DIAGNOSIS — Z5181 Encounter for therapeutic drug level monitoring: Secondary | ICD-10-CM

## 2022-12-17 DIAGNOSIS — Z9889 Other specified postprocedural states: Secondary | ICD-10-CM | POA: Diagnosis not present

## 2022-12-17 LAB — POCT INR: INR: 3.2 — AB (ref 2.0–3.0)

## 2022-12-17 NOTE — Patient Instructions (Signed)
CONTINUE taking 1/2 a tablet daily except for 1 tablet on Mondays, Wednesdays, Fridays.  Recheck INR in 3 weeks

## 2023-01-07 ENCOUNTER — Ambulatory Visit: Payer: Medicare HMO | Attending: Cardiology

## 2023-01-07 DIAGNOSIS — Z5181 Encounter for therapeutic drug level monitoring: Secondary | ICD-10-CM

## 2023-01-07 DIAGNOSIS — Z9889 Other specified postprocedural states: Secondary | ICD-10-CM

## 2023-01-07 DIAGNOSIS — I482 Chronic atrial fibrillation, unspecified: Secondary | ICD-10-CM | POA: Diagnosis not present

## 2023-01-07 DIAGNOSIS — I059 Rheumatic mitral valve disease, unspecified: Secondary | ICD-10-CM

## 2023-01-07 DIAGNOSIS — I4891 Unspecified atrial fibrillation: Secondary | ICD-10-CM

## 2023-01-07 LAB — POCT INR: INR: 4.5 — AB (ref 2.0–3.0)

## 2023-01-07 NOTE — Patient Instructions (Signed)
HOLD TODAY ONLY THEN CONTINUE taking 1/2 a tablet daily except for 1 tablet on Mondays, Wednesdays, Fridays.  Recheck INR in 3 weeks

## 2023-01-12 ENCOUNTER — Other Ambulatory Visit (HOSPITAL_COMMUNITY): Payer: Self-pay

## 2023-01-15 ENCOUNTER — Other Ambulatory Visit (HOSPITAL_COMMUNITY): Payer: Self-pay

## 2023-01-19 ENCOUNTER — Other Ambulatory Visit (HOSPITAL_COMMUNITY): Payer: Self-pay

## 2023-01-19 ENCOUNTER — Encounter (HOSPITAL_COMMUNITY): Payer: Self-pay

## 2023-01-23 DIAGNOSIS — H25011 Cortical age-related cataract, right eye: Secondary | ICD-10-CM | POA: Diagnosis not present

## 2023-01-23 DIAGNOSIS — H2512 Age-related nuclear cataract, left eye: Secondary | ICD-10-CM | POA: Diagnosis not present

## 2023-01-23 DIAGNOSIS — H2511 Age-related nuclear cataract, right eye: Secondary | ICD-10-CM | POA: Diagnosis not present

## 2023-01-28 ENCOUNTER — Ambulatory Visit: Payer: Medicare HMO | Attending: Cardiology

## 2023-01-28 DIAGNOSIS — I059 Rheumatic mitral valve disease, unspecified: Secondary | ICD-10-CM | POA: Diagnosis not present

## 2023-01-28 DIAGNOSIS — Z5181 Encounter for therapeutic drug level monitoring: Secondary | ICD-10-CM | POA: Diagnosis not present

## 2023-01-28 DIAGNOSIS — I482 Chronic atrial fibrillation, unspecified: Secondary | ICD-10-CM | POA: Diagnosis not present

## 2023-01-28 DIAGNOSIS — Z9889 Other specified postprocedural states: Secondary | ICD-10-CM | POA: Diagnosis not present

## 2023-01-28 DIAGNOSIS — I4891 Unspecified atrial fibrillation: Secondary | ICD-10-CM | POA: Diagnosis not present

## 2023-01-28 LAB — POCT INR: INR: 4.4 — AB (ref 2.0–3.0)

## 2023-01-28 NOTE — Patient Instructions (Signed)
HOLD TODAY ONLY THEN DECREASE TO 1/2 a tablet daily except for 1 tablet on Wednesdays Recheck INR in 2 weeks

## 2023-01-29 ENCOUNTER — Other Ambulatory Visit: Payer: Self-pay | Admitting: Family Medicine

## 2023-01-29 ENCOUNTER — Ambulatory Visit (INDEPENDENT_AMBULATORY_CARE_PROVIDER_SITE_OTHER): Payer: Medicare HMO

## 2023-01-29 ENCOUNTER — Ambulatory Visit: Payer: Medicare HMO | Admitting: "Endocrinology

## 2023-01-29 DIAGNOSIS — Z0000 Encounter for general adult medical examination without abnormal findings: Secondary | ICD-10-CM | POA: Diagnosis not present

## 2023-01-29 NOTE — Telephone Encounter (Signed)
Medication Refill - Medication: dicyclomine (BENTYL) 10 MG capsule   Has the patient contacted their pharmacy? No. (Preferred Pharmacy (with phone number or street name): [facility] - [place], [place] - [address]  Has the patient been seen for an appointment in the last year OR does the patient have an upcoming appointment? Yes.    Pt  states she had a flair up last week, and just wants to keep on hand.

## 2023-01-29 NOTE — Patient Instructions (Addendum)
[name] , Thank you for taking time to come for your Medicare Wellness Visit. I appreciate your ongoing commitment to your health goals. Please review the following plan we discussed and let me know if I can assist you in the future.   Referrals/Orders/Follow-Ups/Clinician Recommendations: none  This is a list of the screening recommended for you and due dates:  Health Maintenance  Topic Date Due   Zoster (Shingles) Vaccine (1 of 2) 08/15/2003   COVID-19 Vaccine (4 - 2023-24 season) 02/21/2022   Flu Shot  01/22/2023   Medicare Annual Wellness Visit  01/29/2024   Mammogram  03/10/2024   DEXA scan (bone density measurement)  03/10/2024   Colon Cancer Screening  09/10/2028   Pneumonia Vaccine  Completed   Hepatitis C Screening  Completed   HPV Vaccine  Aged Out   DTaP/Tdap/Td vaccine  Discontinued    Advanced directives: (ACP Link)Information on Advanced Care Planning can be found at [facility] Advance Health Care Directives Advance Health Care Directives ([email or web address]   Next Medicare Annual Wellness Visit scheduled for next year: Yes   02/04/24 @ 8:15 am by phone  Preventive Care 65 Years and Older, Female Preventive care refers to lifestyle choices and visits with your health care provider that can promote health and wellness. What does preventive care include? A yearly physical exam. This is also called an annual well check. Dental exams once or twice a year. Routine eye exams. Ask your health care provider how often you should have your eyes checked. Personal lifestyle choices, including: Daily care of your teeth and gums. Regular physical activity. Eating a healthy diet. Avoiding tobacco and drug use. Limiting alcohol use. Practicing safe sex. Taking low-dose aspirin every day. Taking vitamin and mineral supplements as recommended by your health care provider. What happens during an annual well check? The services and screenings done by your health  care provider during your annual well check will depend on your age, overall health, lifestyle risk factors, and family history of disease. Counseling  Your health care provider may ask you questions about your: Alcohol use. Tobacco use. Drug use. Emotional well-being. Home and relationship well-being. Sexual activity. Eating habits. History of falls. Memory and ability to understand (cognition). Work and work Astronomer. Reproductive health. Screening  You may have the following tests or measurements: Height, weight, and BMI. Blood pressure. Lipid and cholesterol levels. These may be checked every 5 years, or more frequently if you are over 46 years old. Skin check. Lung cancer screening. You may have this screening every year starting at age 41 if you have a 30-pack-year history of smoking and currently smoke or have quit within the past 15 years. Fecal occult blood test (FOBT) of the stool. You may have this test every year starting at age 33. Flexible sigmoidoscopy or colonoscopy. You may have a sigmoidoscopy every 5 years or a colonoscopy every 10 years starting at age 83. Hepatitis C blood test. Hepatitis B blood test. Sexually transmitted disease (STD) testing. Diabetes screening. This is done by checking your blood sugar (glucose) after you have not eaten for a while (fasting). You may have this done every 1-3 years. Bone density scan. This is done to screen for osteoporosis. You may have this done starting at age 24. Mammogram. This may be done every 1-2 years. Talk to your health care provider about how often you should have regular mammograms. Talk with your health care provider about your test results, treatment options, and if  necessary, the need for more tests. Vaccines  Your health care provider may recommend certain vaccines, such as: Influenza vaccine. This is recommended every year. Tetanus, diphtheria, and acellular pertussis (Tdap, Td) vaccine. You may need a Td  booster every 10 years. Zoster vaccine. You may need this after age 28. Pneumococcal 13-valent conjugate (PCV13) vaccine. One dose is recommended after age 58. Pneumococcal polysaccharide (PPSV23) vaccine. One dose is recommended after age 42. Talk to your health care provider about which screenings and vaccines you need and how often you need them. This information is not intended to replace advice given to you by your health care provider. Make sure you discuss any questions you have with your health care provider. Document Released: 07/06/2015 Document Revised: 02/27/2016 Document Reviewed: 04/10/2015 Elsevier Interactive Patient Education  2017 [employer].  Fall Prevention in the Home Falls can cause injuries. They can happen to people of all ages. There are many things you can do to make your home safe and to help prevent falls. What can I do on the outside of my home? Regularly fix the edges of walkways and driveways and fix any cracks. Remove anything that might make you trip as you walk through a door, such as a raised step or threshold. Trim any bushes or trees on the path to your home. Use bright outdoor lighting. Clear any walking paths of anything that might make someone trip, such as rocks or tools. Regularly check to see if handrails are loose or broken. Make sure that both sides of any steps have handrails. Any raised decks and porches should have guardrails on the edges. Have any leaves, snow, or ice cleared regularly. Use sand or salt on walking paths during winter. Clean up any spills in your garage right away. This includes oil or grease spills. What can I do in the bathroom? Use night lights. Install grab bars by the toilet and in the tub and shower. Do not use towel bars as grab bars. Use non-skid mats or decals in the tub or shower. If you need to sit down in the shower, use a plastic, non-slip stool. Keep the floor dry. Clean up any water that spills on the floor  as soon as it happens. Remove soap buildup in the tub or shower regularly. Attach bath mats securely with double-sided non-slip rug tape. Do not have throw rugs and other things on the floor that can make you trip. What can I do in the bedroom? Use night lights. Make sure that you have a light by your bed that is easy to reach. Do not use any sheets or blankets that are too big for your bed. They should not hang down onto the floor. Have a firm chair that has side arms. You can use this for support while you get dressed. Do not have throw rugs and other things on the floor that can make you trip. What can I do in the kitchen? Clean up any spills right away. Avoid walking on wet floors. Keep items that you use a lot in easy-to-reach places. If you need to reach something above you, use a strong step stool that has a grab bar. Keep electrical cords out of the way. Do not use floor polish or wax that makes floors slippery. If you must use wax, use non-skid floor wax. Do not have throw rugs and other things on the floor that can make you trip. What can I do with my stairs? Do not leave any items  on the stairs. Make sure that there are handrails on both sides of the stairs and use them. Fix handrails that are broken or loose. Make sure that handrails are as long as the stairways. Check any carpeting to make sure that it is firmly attached to the stairs. Fix any carpet that is loose or worn. Avoid having throw rugs at the top or bottom of the stairs. If you do have throw rugs, attach them to the floor with carpet tape. Make sure that you have a light switch at the top of the stairs and the bottom of the stairs. If you do not have them, ask someone to add them for you. What else can I do to help prevent falls? Wear shoes that: Do not have high heels. Have rubber bottoms. Are comfortable and fit you well. Are closed at the toe. Do not wear sandals. If you use a stepladder: Make sure that it is  fully opened. Do not climb a closed stepladder. Make sure that both sides of the stepladder are locked into place. Ask someone to hold it for you, if possible. Clearly mark and make sure that you can see: Any grab bars or handrails. First and last steps. Where the edge of each step is. Use tools that help you move around (mobility aids) if they are needed. These include: Canes. Walkers. Scooters. Crutches. Turn on the lights when you go into a dark area. Replace any light bulbs as soon as they burn out. Set up your furniture so you have a clear path. Avoid moving your furniture around. If any of your floors are uneven, fix them. If there are any pets around you, be aware of where they are. Review your medicines with your doctor. Some medicines can make you feel dizzy. This can increase your chance of falling. Ask your doctor what other things that you can do to help prevent falls. This information is not intended to replace advice given to you by your health care provider. Make sure you discuss any questions you have with your health care provider. Document Released: 04/05/2009 Document Revised: 11/15/2015 Document Reviewed: 07/14/2014 Elsevier Interactive Patient Education  2017 [employer].

## 2023-01-29 NOTE — Progress Notes (Signed)
Subjective:   [name] is a 67 y.o. female who presents for Medicare Annual (Subsequent) preventive examination.  Visit Complete: Virtual  I connected with  [name] on 01/29/23 by a audio enabled telemedicine application and verified that I am speaking with the correct person using two identifiers.  Patient Location: Home  Provider Location: Office/Clinic  I discussed the limitations of evaluation and management by telemedicine. The patient expressed understanding and agreed to proceed.  Vital Signs: Unable to obtain new vitals due to this being a telehealth visit.  Review of Systems     Cardiac Risk Factors include: advanced age (>80men, >67 women);hypertension     Objective:    There were no vitals filed for this visit. There is no height or weight on file to calculate BMI.     01/29/2023    8:36 AM 12/27/2021    2:58 PM 09/10/2021    8:17 AM 06/12/2020    9:17 AM 11/07/2019   12:21 PM 06/07/2019    9:21 AM 06/01/2018    8:29 AM  Advanced Directives  Does Patient Have a Medical Advance Directive? No No No No No No No  Would patient like information on creating a medical advance directive? No - Patient declined No - Patient declined No - Patient declined  Yes (MAU/Ambulatory/Procedural Areas - Information given)  No - Patient declined    Current Medications (verified) Outpatient Encounter Medications as of 01/29/2023  Medication Sig   ABRYSVO 120 MCG/0.5ML injection    acyclovir (ZOVIRAX) 400 MG tablet TAKE 1 TABLET BY MOUTH 3 TIMES DAILY FOR 5 TO 10 DAYS OR UNTIL HEALED FOR FEVER BLISTER.   aspirin EC 81 MG tablet Take 1 tablet (81 mg total) by mouth daily.   atorvastatin (LIPITOR) 40 MG tablet Take 1 tablet (40 mg total) by mouth daily.   calcium carbonate (OSCAL) 1500 (600 Ca) MG TABS tablet Take 1 tablet by mouth daily.    cetirizine (ZYRTEC) 10 MG tablet Take 1 tablet (10 mg total) by mouth daily.   Cholecalciferol (VITAMIN D3) 2000 units capsule Take  1 capsule (2,000 Units total) by mouth daily.   denosumab (PROLIA) 60 MG/ML SOSY injection Inject 60 mg into the skin every 6 (six) months.   dicyclomine (BENTYL) 10 MG capsule TAKE 1 CAPSULE BY MOUTH 4 TIMES A DAY BEFORE MEALS AND AT BEDTIME.   fluticasone (FLONASE) 50 MCG/ACT nasal spray PLACE 2 SPRAYS INTO BOTH NOSTRILS DAILY   furosemide (LASIX) 20 MG tablet Take 1 tablet (20 mg total) by mouth every other day.   lisinopril (ZESTRIL) 40 MG tablet Take 1 tablet (40 mg total) by mouth daily.   methocarbamol (ROBAXIN) 500 MG tablet TAKE 1 TABLET FOUR TIMES DAILY AS NEEDED FOR MUSCLE SPASMS (PAIN)   metoprolol succinate (TOPROL-XL) 100 MG 24 hr tablet Take 1.5 tablets (150 mg total) by mouth daily. Take with or immediately following a meal.   Multiple Vitamins-Minerals (CENTRUM SILVER ADULT 50+ PO) Take 1 tablet by mouth daily.   triamcinolone cream (KENALOG) 0.5 % Apply 1 application topically 2 (two) times daily. To affected areas, for up to 2 weeks.   warfarin (COUMADIN) 5 MG tablet TAKE 1/2 TO 1 TABLET DAILY AS DIRECTED BY COUMADIN CLINIC   Facility-Administered Encounter Medications as of 01/29/2023  Medication   denosumab (PROLIA) injection 60 mg    Allergies (verified) Patient has no known allergies.   History: Past Medical History:  Diagnosis Date   Anticoagulated on warfarin  Arthritis    Heart murmur    History of cardiac catheterization    a. 2008 Cath: nl cors.   History of hyperthyroidism    a. 2003--  PTU [place]   Hypercholesterolemia    Hyperlipidemia    Multinodular goiter    Permanent atrial fibrillation (HCC)    a. Dx 1997; b. CHA2DS2VASc = 3-->warfarin (s/p MVR as well); c. 06/2013 Holter: HRs freq in low 100's to 1-teens w/ max rate 179-->beta blocker increased.   Pulmonary hypertension, mild (HCC)    Rheumatic heart disease    S/P MVR (mitral valve replacement)    a. 1997 s/p SJM mechanical MVR 2/2 Sev MS; b. chronic warfarin; c. 10/2017 Echo: EF 55-60%, no rwma,  triv AI. Nl fxn of MV prosthesis. Sev dil LA. Mildly to mod increased PA pressures.   Past Surgical History:  Procedure Laterality Date   ANTERIOR CERVICAL DECOMP/DISCECTOMY FUSION  12-21-2001   C5 --  C6   BREAST BIOPSY Left 11/03/2016   Procedure: BREAST BIOPSY WITH NEEDLE LOCALIZATION;  Surgeon: Earline Mayotte, MD;  Location: [facility] ORS;  Service: General;  Laterality: Left;   BREAST EXCISIONAL BIOPSY Left 2018   CARDIAC CATHETERIZATION  12-28-2006  [facility]   NORMAL CORONARY ARTERIES   CARPAL TUNNEL RELEASE Right 01-20-2007   COLONOSCOPY  2015   COLONOSCOPY WITH PROPOFOL N/A 09/10/2021   Procedure: COLONOSCOPY WITH PROPOFOL;  Surgeon: Midge Minium, MD;  Location: [facility] ENDOSCOPY;  Service: Endoscopy;  Laterality: N/A;   ERCP  08/15/2011   Procedure: ENDOSCOPIC RETROGRADE CHOLANGIOPANCREATOGRAPHY (ERCP);  Surgeon: Petra Kuba, MD;  Location: [facility] ENDOSCOPY;  Service: Endoscopy;  Laterality: N/A;   ERCP     MULTIPLE--  INCLUDING STENTS, SPHINTEROTOMY'S  STONE REMOVAL    ESOPHAGOGASTRODUODENOSCOPY (EGD) WITH PROPOFOL  09/10/2021   Procedure: ESOPHAGOGASTRODUODENOSCOPY (EGD) WITH PROPOFOL;  Surgeon: Midge Minium, MD;  Location: [facility] ENDOSCOPY;  Service: Endoscopy;;   FEMUR IM NAIL Right 04/26/2014   Procedure: INTRAMEDULLARY (IM) NAIL FEMORAL;  Surgeon: Harvie Junior, MD;  Location: [facility] OR;  Service: Orthopedics;  Laterality: Right;   I & D EXTREMITY Right 02/07/2013   Procedure: IRRIGATION AND DEBRIDEMENT OF RIGHT LOWER LEG WITH PLACEMENT OF ACELL AND WOULND VAC;  Surgeon: Wayland Denis, DO;  Location: [facility];  Service: Plastics;  Laterality: Right;   LAPAROSCOPIC CHOLECYSTECTOMY  04-08-2002   MITRAL VALVE REPLACEMENT  1997   [facility]   ORIF ANKLE FRACTURE Right 11/05/2012   Procedure: OPEN REDUCTION INTERNAL FIXATION (ORIF) ANKLE FRACTURE only operating on right;  Surgeon: Harvie Junior, MD;  Location: [facility] OR;  Service: Orthopedics;  Laterality: Right;   ORIF RIGHT ULNAR  FX W/ ILIAC CREST BONE GRAFT  10-06-2008   ORIF WRIST FRACTURE Right 04/26/2014   Procedure: OPEN REDUCTION INTERNAL FIXATION (ORIF) WRIST FRACTURE;  Surgeon: Harvie Junior, MD;  Location: [facility] OR;  Service: Orthopedics;  Laterality: Right;   TRANSTHORACIC ECHOCARDIOGRAM  06-29-2009  dr wall   normal lvsf/ ef 55-60%/  normal bileaflet mechanical mvr/ moderated dilated la/ moderate tr   WRIST ARTHROSCOPY Right 12-10-2007   debridement and ulnar shortening osteotomy w/ plate   Family History  Problem Relation Age of Onset   Hypothyroidism Sister    Gallbladder disease Sister    Coronary artery disease Other    Diabetes Mother    Hypertension Mother    Heart disease Father    Stroke Father    Breast cancer Neg Hx    Social History  Socioeconomic History   Marital status: Divorced    Spouse name: Not on file   Number of children: 0   Years of education: 41   Highest education level: 12th grade  Occupational History   Occupation: Geologist, engineering    Comment: retired  Tobacco Use   Smoking status: Never   Smokeless tobacco: Never  Vaping Use   Vaping status: Never Used  Substance and Sexual Activity   Alcohol use: No   Drug use: No   Sexual activity: Not on file  Other Topics Concern   Not on file  Social History Narrative   Not on file   Social Determinants of Health   Financial Resource Strain: Low Risk  (01/29/2023)   Overall Financial Resource Strain (CARDIA)    Difficulty of Paying Living Expenses: Not hard at all  Food Insecurity: No Food Insecurity (01/29/2023)   Hunger Vital Sign    Worried About Running Out of Food in the Last Year: Never true    Ran Out of Food in the Last Year: Never true  Transportation Needs: No Transportation Needs (01/29/2023)   PRAPARE - Administrator, Civil Service (Medical): No    Lack of Transportation (Non-Medical): No  Physical Activity: Insufficiently Active (01/29/2023)   Exercise Vital Sign    Days of Exercise per Week:  2 days    Minutes of Exercise per Session: 20 min  Stress: No Stress Concern Present (01/29/2023)   [employer] of Occupational Health - Occupational Stress Questionnaire    Feeling of Stress : Not at all  Social Connections: Moderately Isolated (01/29/2023)   Social Connection and Isolation Panel [NHANES]    Frequency of Communication with Friends and Family: More than three times a week    Frequency of Social Gatherings with Friends and Family: More than three times a week    Attends Religious Services: More than 4 times per year    Active Member of [employer] or Organizations: No    Attends Engineer, structural: Never    Marital Status: Divorced    Tobacco Counseling Counseling given: Not Answered   Clinical Intake:  Pre-visit preparation completed: Yes  Pain : No/denies pain     Nutritional Risks: None Diabetes: No  How often do you need to have someone help you when you read instructions, pamphlets, or other written materials from your doctor or pharmacy?: 1 - Never  Interpreter Needed?: No  Information entered by :: Kennedy Bucker, LPN   Activities of Daily Living    01/29/2023    8:37 AM 01/25/2023    8:37 AM  In your present state of health, do you have any difficulty performing the following activities:  Hearing? 0 0  Vision? 0 0  Difficulty concentrating or making decisions? 0 0  Walking or climbing stairs? 0 0  Dressing or bathing? 0 0  Doing errands, shopping? 0 0  Preparing Food and eating ? N N  Using the Toilet? N N  In the past six months, have you accidently leaked urine? N N  Do you have problems with loss of bowel control? N N  Managing your Medications? N N  Managing your Finances? N N  Housekeeping or managing your Housekeeping? N N    Patient Care Team: Smitty Cords, DO as PCP - General (Family Medicine) Jens Som Madolyn Frieze, MD as PCP - Cardiology (Cardiology) Midge Minium, MD as Consulting Physician  (Gastroenterology) Jens Som Madolyn Frieze, MD as Consulting Physician (Cardiology)  Indicate any recent Medical Services you may have received from other than [facility] providers in the past year (date may be approximate).     Assessment:   This is a routine wellness examination for [name].  Hearing/Vision screen Hearing Screening - Comments:: No aids Vision Screening - Comments:: Wears glasses, had cataract sgy- Dr.Nice  Dietary issues and exercise activities discussed:     Goals Addressed             This Visit's Progress    DIET - REDUCE SUGAR INTAKE         Depression Screen    01/29/2023    8:35 AM 06/10/2022    8:02 AM 12/27/2021    2:56 PM 03/11/2021   10:24 AM 12/05/2020    8:00 AM 06/12/2020    9:18 AM 06/01/2020    9:15 AM  PHQ 2/9 Scores  PHQ - 2 Score 0 0 0 0 0 0 0  PHQ- 9 Score 0 0 0  0      Fall Risk    01/29/2023    8:37 AM 01/25/2023    8:37 AM 12/29/2022    8:51 AM 06/10/2022    8:02 AM 12/27/2021    2:58 PM  Fall Risk   Falls in the past year? 0 0 0 0 0  Number falls in past yr: 0 0 0 0 0  Injury with Fall? 0 0 0 0 0  Risk for fall due to : No Fall Risks   No Fall Risks No Fall Risks  Follow up Falls prevention discussed;Falls evaluation completed   Falls evaluation completed Falls evaluation completed    MEDICARE RISK AT HOME:  Medicare Risk at Home - 01/29/23 0837     Any stairs in or around the home? No    If so, are there any without handrails? No    Home free of loose throw rugs in walkways, pet beds, electrical cords, etc? Yes    Adequate lighting in your home to reduce risk of falls? Yes    Life alert? No    Use of a cane, walker or w/c? No    Grab bars in the bathroom? Yes    Shower chair or bench in shower? Yes    Elevated toilet seat or a handicapped toilet? Yes             TIMED UP AND GO:  Was the test performed?  No    Cognitive Function:        01/29/2023    8:38 AM 06/12/2020    9:20 AM 06/01/2018    8:33 AM 05/26/2017     8:58 AM  6CIT Screen  What Year? 0 points 0 points 0 points 0 points  What month? 0 points 0 points 0 points 0 points  What time? 0 points 0 points 0 points 0 points  Count back from 20 0 points 0 points 0 points 0 points  Months in reverse 0 points 0 points 0 points 0 points  Repeat phrase 0 points 0 points 0 points 2 points  Total Score 0 points 0 points 0 points 2 points    Immunizations Immunization History  Administered Date(s) Administered   Fluad Quad(high Dose 65+) 03/23/2022   Influenza, High Dose Seasonal PF 03/02/2019, 03/19/2020, 03/19/2021   Influenza-Unspecified 03/17/2016, 03/30/2018, 03/16/2020   PFIZER(Purple Top)SARS-COV-2 Vaccination 08/24/2019, 09/14/2019, 03/20/2020   PNEUMOCOCCAL CONJUGATE-20 06/06/2021   Respiratory Syncytial Virus Vaccine,Recomb Aduvanted(Arexvy) 03/23/2022   Zoster, Live 04/29/2016  TDAP status: Due, Education has been provided regarding the importance of this vaccine. Advised may receive this vaccine at local pharmacy or Health Dept. Aware to provide a copy of the vaccination record if obtained from local pharmacy or Health Dept. Verbalized acceptance and understanding.  Flu Vaccine status: Up to date  Pneumococcal vaccine status: Up to date  Covid-19 vaccine status: Completed vaccines  Qualifies for Shingles Vaccine? Yes   Zostavax completed Yes   Shingrix Completed?: No.    Education has been provided regarding the importance of this vaccine. Patient has been advised to call insurance company to determine out of pocket expense if they have not yet received this vaccine. Advised may also receive vaccine at local pharmacy or Health Dept. Verbalized acceptance and understanding.  Screening Tests Health Maintenance  Topic Date Due   Zoster Vaccines- Shingrix (1 of 2) 08/15/2003   COVID-19 Vaccine (4 - 2023-24 season) 02/21/2022   INFLUENZA VACCINE  01/22/2023   Medicare Annual Wellness (AWV)  01/29/2024   MAMMOGRAM  03/10/2024    DEXA SCAN  03/10/2024   Colonoscopy  09/10/2028   Pneumonia Vaccine 85+ Years old  Completed   Hepatitis C Screening  Completed   HPV VACCINES  Aged Out   DTaP/Tdap/Td  Discontinued    Health Maintenance  Health Maintenance Due  Topic Date Due   Zoster Vaccines- Shingrix (1 of 2) 08/15/2003   COVID-19 Vaccine (4 - 2023-24 season) 02/21/2022   INFLUENZA VACCINE  01/22/2023    Colorectal cancer screening: Type of screening: Colonoscopy. Completed 09/10/21. Repeat every 7 years  Mammogram status: Completed 03/10/22. Repeat every year- she does it every 2 years  Bone Density status: Completed 03/10/22. Results reflect: Bone density results: OSTEOPOROSIS. Repeat every 2 years.  Lung Cancer Screening: (Low Dose CT Chest recommended if Age 57-80 years, 20 pack-year currently smoking OR have quit w/in 15years.) does not qualify.   Additional Screening:  Hepatitis C Screening: does qualify; Completed 04/10/16  Vision Screening: Recommended annual ophthalmology exams for early detection of glaucoma and other disorders of the eye. Is the patient up to date with their annual eye exam?  Yes  Who is the provider or what is the name of the office in which the patient attends annual eye exams? Dr.Nice If pt is not established with a provider, would they like to be referred to a provider to establish care? No .   Dental Screening: Recommended annual dental exams for proper oral hygiene   Community Resource Referral / Chronic Care Management: CRR required this visit?  No   CCM required this visit?  No     Plan:     I have personally reviewed and noted the following in the patient's chart:   Medical and social history Use of alcohol, tobacco or illicit drugs  Current medications and supplements including opioid prescriptions. Patient is not currently taking opioid prescriptions. Functional ability and status Nutritional status Physical activity Advanced directives List of other  physicians Hospitalizations, surgeries, and ER visits in previous 12 months Vitals Screenings to include cognitive, depression, and falls Referrals and appointments  In addition, I have reviewed and discussed with patient certain preventive protocols, quality metrics, and best practice recommendations. A written personalized care plan for preventive services as well as general preventive health recommendations were provided to patient.     Hal Hope, LPN   4/0/3474   After Visit Summary: (MyChart) Due to this being a telephonic visit, the after visit summary with patients personalized  plan was offered to patient via MyChart   Nurse Notes: none

## 2023-01-30 MED ORDER — DICYCLOMINE HCL 10 MG PO CAPS: ORAL_CAPSULE | ORAL | 0 refills | Status: DC

## 2023-01-30 NOTE — Telephone Encounter (Signed)
Requested medication (s) are due for refill today: Yes  Requested medication (s) are on the active medication list: Yes  Last refill:  11/17/18  Future visit scheduled: Yes  Notes to clinic:  Last filled by Dr. Servando Snare  11/17/18.    Requested Prescriptions  Pending Prescriptions Disp Refills   dicyclomine (BENTYL) 10 MG capsule 90 capsule 1     Gastroenterology:  Antispasmodic Agents Passed - 01/29/2023  8:05 AM      Passed - Valid encounter within last 12 months    Recent Outpatient Visits           7 months ago Annual physical exam   [facility] Smitty Cords, DO   1 year ago Annual physical exam   [place] [facility] McPherson, Netta Neat, DO   2 years ago [employer], [employer]   [place] [facility] Smitty Cords, DO   2 years ago Annual physical exam   [facility] Smitty Cords, DO   3 years ago Elevated hemoglobin A1c   [place] [facility] Smitty Cords, DO       Future Appointments             In 4 months Althea Charon, Netta Neat, DO [place] [facility], [facility]

## 2023-02-02 ENCOUNTER — Other Ambulatory Visit: Payer: Self-pay | Admitting: *Deleted

## 2023-02-02 DIAGNOSIS — E042 Nontoxic multinodular goiter: Secondary | ICD-10-CM

## 2023-02-02 DIAGNOSIS — M8080XA Other osteoporosis with current pathological fracture, unspecified site, initial encounter for fracture: Secondary | ICD-10-CM

## 2023-02-06 DIAGNOSIS — H25012 Cortical age-related cataract, left eye: Secondary | ICD-10-CM | POA: Diagnosis not present

## 2023-02-06 DIAGNOSIS — H2512 Age-related nuclear cataract, left eye: Secondary | ICD-10-CM | POA: Diagnosis not present

## 2023-02-11 ENCOUNTER — Other Ambulatory Visit: Payer: Self-pay | Admitting: Cardiology

## 2023-02-11 ENCOUNTER — Ambulatory Visit: Payer: Medicare HMO | Attending: Cardiology

## 2023-02-11 ENCOUNTER — Telehealth: Payer: Self-pay | Admitting: Cardiovascular Disease

## 2023-02-11 DIAGNOSIS — Z5181 Encounter for therapeutic drug level monitoring: Secondary | ICD-10-CM

## 2023-02-11 DIAGNOSIS — I4891 Unspecified atrial fibrillation: Secondary | ICD-10-CM | POA: Diagnosis not present

## 2023-02-11 DIAGNOSIS — I482 Chronic atrial fibrillation, unspecified: Secondary | ICD-10-CM

## 2023-02-11 DIAGNOSIS — I059 Rheumatic mitral valve disease, unspecified: Secondary | ICD-10-CM | POA: Diagnosis not present

## 2023-02-11 DIAGNOSIS — Z9889 Other specified postprocedural states: Secondary | ICD-10-CM

## 2023-02-11 LAB — POCT INR: INR: 2.6 (ref 2.0–3.0)

## 2023-02-11 NOTE — Telephone Encounter (Signed)
Patient dropped off Application renewal for disability parking placard to be filled out placed in box

## 2023-02-11 NOTE — Telephone Encounter (Signed)
Placed in Dr. Windell Hummingbird office for review.

## 2023-02-11 NOTE — Patient Instructions (Signed)
Continue 1/2 a tablet daily except for 1 tablet on Wednesdays Recheck INR in 4 weeks

## 2023-02-11 NOTE — Telephone Encounter (Signed)
Warfarin 5mg  refill Afib Last INR 01/28/23 Last OV 08/01/22

## 2023-02-12 ENCOUNTER — Other Ambulatory Visit: Payer: Self-pay

## 2023-02-12 ENCOUNTER — Ambulatory Visit: Payer: Medicare HMO | Admitting: "Endocrinology

## 2023-02-12 DIAGNOSIS — M8080XA Other osteoporosis with current pathological fracture, unspecified site, initial encounter for fracture: Secondary | ICD-10-CM

## 2023-02-12 DIAGNOSIS — E042 Nontoxic multinodular goiter: Secondary | ICD-10-CM | POA: Diagnosis not present

## 2023-02-13 LAB — COMPREHENSIVE METABOLIC PANEL
ALT: 15 IU/L (ref 0–32)
AST: 25 IU/L (ref 0–40)
Albumin: 3.9 g/dL (ref 3.9–4.9)
Alkaline Phosphatase: 93 IU/L (ref 44–121)
BUN/Creatinine Ratio: 15 (ref 12–28)
BUN: 15 mg/dL (ref 8–27)
Bilirubin Total: 0.8 mg/dL (ref 0.0–1.2)
CO2: 25 mmol/L (ref 20–29)
Calcium: 9.2 mg/dL (ref 8.7–10.3)
Chloride: 104 mmol/L (ref 96–106)
Creatinine, Ser: 0.97 mg/dL (ref 0.57–1.00)
Globulin, Total: 3 g/dL (ref 1.5–4.5)
Glucose: 85 mg/dL (ref 70–99)
Potassium: 4.5 mmol/L (ref 3.5–5.2)
Sodium: 141 mmol/L (ref 134–144)
Total Protein: 6.9 g/dL (ref 6.0–8.5)
eGFR: 63 mL/min/{1.73_m2} (ref >59)

## 2023-02-13 LAB — T4, FREE: Free T4: 1.22 ng/dL (ref 0.82–1.77)

## 2023-02-13 LAB — TSH: TSH: 0.881 u[IU]/mL (ref 0.450–4.500)

## 2023-02-13 LAB — VITAMIN D 25 HYDROXY (VIT D DEFICIENCY, FRACTURES): Vit D, 25-Hydroxy: 43.7 ng/mL (ref 30.0–100.0)

## 2023-02-16 NOTE — Telephone Encounter (Signed)
Called patient and left a message informing her that her disability placard form had been completed. Form placed in office for pick up.

## 2023-02-20 ENCOUNTER — Ambulatory Visit (INDEPENDENT_AMBULATORY_CARE_PROVIDER_SITE_OTHER): Payer: Medicare HMO | Admitting: "Endocrinology

## 2023-02-20 ENCOUNTER — Encounter: Payer: Self-pay | Admitting: "Endocrinology

## 2023-02-20 VITALS — BP 112/76 | HR 64 | Ht 64.0 in | Wt 172.0 lb

## 2023-02-20 DIAGNOSIS — E042 Nontoxic multinodular goiter: Secondary | ICD-10-CM

## 2023-02-20 DIAGNOSIS — M8080XA Other osteoporosis with current pathological fracture, unspecified site, initial encounter for fracture: Secondary | ICD-10-CM | POA: Diagnosis not present

## 2023-02-20 MED ORDER — DENOSUMAB 60 MG/ML ~~LOC~~ SOSY
60.0000 mg | PREFILLED_SYRINGE | Freq: Once | SUBCUTANEOUS | Status: AC
Start: 2023-02-20 — End: 2023-02-20
  Administered 2023-02-20: 60 mg via SUBCUTANEOUS

## 2023-02-20 NOTE — Progress Notes (Signed)
Pt seen in office for follow up and prolia injection. Pt provided her prolia. Prolia  60mg  give SQ in upper L arm without difficulty. Lot #[id number], Exp. Date 11/20/2024. Pt waited the required 15 minutes post injection, no adverse reactions noted.

## 2023-02-20 NOTE — Progress Notes (Signed)
02/20/2023        Endocrinology follow-up note  Past Medical History:  Diagnosis Date   Anticoagulated on warfarin    Arthritis    Heart murmur    History of cardiac catheterization    a. 2008 Cath: nl cors.   History of hyperthyroidism    a. 2003--  PTU [place]   Hypercholesterolemia    Hyperlipidemia    Multinodular goiter    Permanent atrial fibrillation (HCC)    a. Dx 1997; b. CHA2DS2VASc = 3-->warfarin (s/p MVR as well); c. 06/2013 Holter: HRs freq in low 100's to 1-teens w/ max rate 179-->beta blocker increased.   Pulmonary hypertension, mild (HCC)    Rheumatic heart disease    S/P MVR (mitral valve replacement)    a. 1997 s/p SJM mechanical MVR 2/2 Sev MS; b. chronic warfarin; c. 10/2017 Echo: EF 55-60%, no rwma, triv AI. Nl fxn of MV prosthesis. Sev dil LA. Mildly to mod increased PA pressures.   Past Surgical History:  Procedure Laterality Date   ANTERIOR CERVICAL DECOMP/DISCECTOMY FUSION  12-21-2001   C5 --  C6   BREAST BIOPSY Left 11/03/2016   Procedure: BREAST BIOPSY WITH NEEDLE LOCALIZATION;  Surgeon: Earline Mayotte, MD;  Location: [facility] ORS;  Service: General;  Laterality: Left;   BREAST EXCISIONAL BIOPSY Left 2018   CARDIAC CATHETERIZATION  12-28-2006  [facility]   NORMAL CORONARY ARTERIES   CARPAL TUNNEL RELEASE Right 01-20-2007   COLONOSCOPY  2015   COLONOSCOPY WITH PROPOFOL N/A 09/10/2021   Procedure: COLONOSCOPY WITH PROPOFOL;  Surgeon: Midge Minium, MD;  Location[facility] END[facility];  Service: Endoscopy;  Laterality: N/A;   ERCP  08/15/2011   Procedure: ENDOSCOPIC RETROGRADE CHOLANGIOPANCREATOGRAPHY (ERCP);  Surgeon: Petra Kuba, MD;  Location: [facility] END[facility]COPY;  Service: Endoscopy;  Laterality: N/A;   ERCP     MULTIPLE--  INCLUDING STENTS, SPHINTEROTOMY'S  STONE REMOVAL    ESOPHAGOGASTRODUODENOSCOPY (EGD) WITH PROPOFOL  09/10/2021   Procedure: ESOPHAGOGASTRODUODENOSCOPY (EGD) WITH PROPOFOL;  Surgeon:  Midge Minium, MD;  Location: [facility] ENDOSCOPY;  Service: Endoscopy;;   FEMUR IM NAIL Right 04/26/2014   Procedure: INTRAMEDULLARY (IM) NAIL FEMORAL;  Surgeon: Harvie Junior, MD;  Location: [facility] OR;  Service: Orthopedics;  Laterality: Right;   I & D EXTREMITY Right 02/07/2013   Procedure: IRRIGATION AND DEBRIDEMENT OF RIGHT LOWER LEG WITH PLACEMENT OF ACELL AND WOULND VAC;  Surgeon: Wayland Denis, DO;  Location: [facility];  Service: Plastics;  Laterality: Right;   LAPAROSCOPIC CHOLECYSTECTOMY  04-08-2002   MITRAL VALVE REPLACEMENT  1997   ST JUDE   ORIF ANKLE FRACTURE Right 11/05/2012   Procedure: OPEN REDUCTION INTERNAL FIXATION (ORIF) ANKLE FRACTURE only operating on right;  Surgeon: Harvie Junior, MD;  Location: [facility] OR;  Service: Orthopedics;  Laterality: Right;   ORIF RIGHT ULNAR FX W/ ILIAC CREST BONE GRAFT  10-06-2008   ORIF WRIST FRACTURE Right 04/26/2014   Procedure: OPEN REDUCTION INTERNAL FIXATION (ORIF) WRIST FRACTURE;  Surgeon: Harvie Junior, MD;  Location: [facility] OR;  Service: Orthopedics;  Laterality: Right;   TRANSTHORACIC ECHOCARDIOGRAM  06-29-2009  dr wall   normal lvsf/ ef 55-60%/  normal  bileaflet mechanical mvr/ moderated dilated la/ moderate tr   WRIST ARTHROSCOPY Right 12-10-2007   debridement and ulnar shortening osteotomy w/ plate   Social History   Socioeconomic History   Marital status: Divorced    Spouse name: Not on file   Number of children: 0   Years of education: 12   Highest education level: 12th grade  Occupational History   Occupation: Geologist, engineering    Comment: retired  Tobacco Use   Smoking status: Never   Smokeless tobacco: Never  Vaping Use   Vaping status: Never Used  Substance and Sexual Activity   Alcohol use: No   Drug use: No   Sexual activity: Not on file  Other Topics Concern   Not on file  Social History Narrative   Not on file   Social Determinants of Health   Financial Resource Strain: Low Risk  (01/29/2023)   Overall  Financial Resource Strain (CARDIA)    Difficulty of Paying Living Expenses: Not hard at all  Food Insecurity: No Food Insecurity (01/29/2023)   Hunger Vital Sign    Worried About Running Out of Food in the Last Year: Never true    Ran Out of Food in the Last Year: Never true  Transportation Needs: No Transportation Needs (01/29/2023)   PRAPARE - Administrator, Civil Service (Medical): No    Lack of Transportation (Non-Medical): No  Physical Activity: Insufficiently Active (01/29/2023)   Exercise Vital Sign    Days of Exercise per Week: 2 days    Minutes of Exercise per Session: 20 min  Stress: No Stress Concern Present (01/29/2023)   [employer] of Occupational Health - Occupational Stress Questionnaire    Feeling of Stress : Not at all  Social Connections: Moderately Isolated (01/29/2023)   Social Connection and Isolation Panel [NHANES]    Frequency of Communication with Friends and Family: More than three times a week    Frequency of Social Gatherings with Friends and Family: More than three times a week    Attends Religious Services: More than 4 times per year    Active Member of [employer] or Organizations: No    Attends Banker Meetings: Never    Marital Status: Divorced   Outpatient Encounter Medications as of 02/20/2023  Medication Sig   ABRYSVO 120 MCG/0.5ML injection    acyclovir (ZOVIRAX) 400 MG tablet TAKE 1 TABLET BY MOUTH 3 TIMES DAILY FOR 5 TO 10 DAYS OR UNTIL HEALED FOR FEVER BLISTER.   aspirin EC 81 MG tablet Take 1 tablet (81 mg total) by mouth daily.   atorvastatin (LIPITOR) 40 MG tablet Take 1 tablet (40 mg total) by mouth daily.   calcium carbonate (OSCAL) 1500 (600 Ca) MG TABS tablet Take 1 tablet by mouth daily.    cetirizine (ZYRTEC) 10 MG tablet Take 1 tablet (10 mg total) by mouth daily.   Cholecalciferol (VITAMIN D3) 2000 units capsule Take 1 capsule (2,000 Units total) by mouth daily.   denosumab (PROLIA) 60 MG/ML SOSY injection Inject 60  mg into the skin every 6 (six) months.   dicyclomine (BENTYL) 10 MG capsule TAKE 1 CAPSULE BY MOUTH 4 TIMES A DAY BEFORE MEALS AND AT BEDTIME.   fluticasone (FLONASE) 50 MCG/ACT nasal spray PLACE 2 SPRAYS INTO BOTH NOSTRILS DAILY   furosemide (LASIX) 20 MG tablet Take 1 tablet (20 mg total) by mouth every other day.   lisinopril (ZESTRIL) 40 MG tablet Take 1 tablet (40 mg total) by mouth daily.  methocarbamol (ROBAXIN) 500 MG tablet TAKE 1 TABLET FOUR TIMES DAILY AS NEEDED FOR MUSCLE SPASMS (PAIN)   metoprolol succinate (TOPROL-XL) 100 MG 24 hr tablet Take 1.5 tablets (150 mg total) by mouth daily. Take with or immediately following a meal.   Multiple Vitamins-Minerals (CENTRUM SILVER ADULT 50+ PO) Take 1 tablet by mouth daily.   triamcinolone cream (KENALOG) 0.5 % Apply 1 application topically 2 (two) times daily. To affected areas, for up to 2 weeks.   warfarin (COUMADIN) 5 MG tablet TAKE 1/2 TO 1 TABLET DAILY AS DIRECTED BY COUMADIN CLINIC   Facility-Administered Encounter Medications as of 02/20/2023  Medication   denosumab (PROLIA) injection 60 mg   ALLERGIES: No Known Allergies  VACCINATION STATUS: Immunization History  Administered Date(s) Administered   Fluad Quad(high Dose 65+) 03/23/2022   Influenza, High Dose Seasonal PF 03/02/2019, 03/19/2020, 03/19/2021   Influenza-Unspecified 03/17/2016, 03/30/2018, 03/16/2020   PFIZER(Purple Top)SARS-COV-2 Vaccination 08/24/2019, 09/14/2019, 03/20/2020   PNEUMOCOCCAL CONJUGATE-20 06/06/2021   Respiratory Syncytial Virus Vaccine,Recomb Aduvanted(Arexvy) 03/23/2022   Zoster, Live 04/29/2016     HPI   [name] is 67 y.o. female who presents today with a medical history as above. she is being seen in consultation for osteoporosis requested by Smitty Cords, DO.  Patient was diagnosed with osteoporosis at age of 44 years.  She was started on alendronate 35 mg p.o. weekly, however reportedly this was complicated dental  problems which led to loss of some teeth.   Patient gives history of multiple fractures of lower extremities in the past.  She broke her ankles in 2014.  Broke femur in 2018.  She admits to loss of height, unquantified.  She has no new complaints today.   She was treated with Prolia during her last visit, tolerating very well.  She is ready for her next injection. Her other medical problems include hyperlipidemia, hypertension on treatment.  She has also medical history of multinodular goiter which was previously worked up.  She is not on thyroid hormone or antithyroid intervention at this time.  I reviewed bone density done for her in September 2023 which showed a T-score of -2.6 on left hip. She denies any medical history of malignancy. She is on regular dose of calcium carbonate, and vitamin D3 2000 units daily.   -She is on Coumadin, does not consume optimal levels of dark green vegetables. She mostly leads sedentary lifestyle. -Her most recent labs show calcium of 9.6, normal thyroid function test.  She has normal renal function.  She went into menopause in late 30s.     Review of Systems  Constitutional: + Minimally fluctuating body weight, + fatigue, no subjective hyperthermia, no subjective hypothermia Eyes: no blurry vision, no xerophthalmia ENT: no sore throat, no nodules palpated in throat, no dysphagia/odynophagia, no hoarseness   Objective:    BP 112/76   Pulse 64   Ht 5\' 4"  (1.626 m)   Wt 172 lb (78 kg)   BMI 29.52 kg/m   Wt Readings from Last 3 Encounters:  02/20/23 172 lb (78 kg)  08/14/22 170 lb 6.4 oz (77.3 kg)  08/01/22 168 lb 13.9 oz (76.6 kg)    Physical Exam  Constitutional:+ BMI of 29.46, not in acute distress, normal state of mind Eyes: PERRLA, EOMI, no exophthalmos ENT: moist mucous membranes, no thyromegaly, no cervical lymphadenopathy   CMP ( most recent) CMP     Component Value Date/Time   NA 141 02/12/2023 0910   K 4.5 02/12/2023 0910  CL  104 02/12/2023 0910   CO2 25 02/12/2023 0910   GLUCOSE 85 02/12/2023 0910   GLUCOSE 96 06/03/2022 0808   BUN 15 02/12/2023 0910   CREATININE 0.97 02/12/2023 0910   CREATININE 0.85 06/03/2022 0808   CALCIUM 9.2 02/12/2023 0910   PROT 6.9 02/12/2023 0910   ALBUMIN 3.9 02/12/2023 0910   AST 25 02/12/2023 0910   ALT 15 02/12/2023 0910   ALKPHOS 93 02/12/2023 0910   BILITOT 0.8 02/12/2023 0910   GFRNONAA 62 11/28/2020 0801   GFRAA 72 11/28/2020 0801     Diabetic Labs (most recent): Lab Results  Component Value Date   HGBA1C 5.6 06/03/2022   HGBA1C 5.6 05/30/2021   HGBA1C 5.4 05/25/2020     Lipid Panel ( most recent) Lipid Panel     Component Value Date/Time   CHOL 162 06/03/2022 0808   CHOL 157 10/01/2016 0919   TRIG 94 06/03/2022 0808   HDL 64 06/03/2022 0808   HDL 46 10/01/2016 0919   CHOLHDL 2.5 06/03/2022 0808   VLDL 20 06/05/2015 0926   LDLCALC 80 06/03/2022 0808   LDLDIRECT 192.5 11/04/2010 1131   LABVLDL 22 10/01/2016 0919      Lab Results  Component Value Date   TSH 0.881 02/12/2023   TSH 1.09 06/03/2022   TSH 0.78 05/30/2021   TSH 0.40 11/28/2020   TSH 0.24 (L) 05/25/2020   TSH 0.43 06/01/2018   TSH 0.59 05/26/2017   TSH 0.71 12/02/2016   TSH 0.80 04/19/2009   TSH 0.50 04/17/2008   FREET4 1.22 02/12/2023   FREET4 1.1 06/03/2022   FREET4 1.0 05/30/2021   FREET4 1.3 11/28/2020   FREET4 0.9 06/01/2018   FREET4 1.2 12/02/2016   FREET4 0.9 10/12/2007   FREET4 0.8 09/14/2006    Bone density from March 10, 2022 AP Spine L1-L4 03/10/2022 68.5 Normal -0.7 1.107 g/cm2 - -   Left Femur Neck 03/10/2022 68.5 Osteoporosis -2.6 0.678 g/cm2 - -   ASSESSMENT: The BMD measured at Femur Neck is 0.678 g/cm2 with a T-score of -2.6. This patient is considered osteoporotic according to World Health Organization [facility]) criteria. The scan quality is good.  Assessment: 1. Osteoporosis  Plan: 1. Osteoporosis - likely postmenopausal  - Discussed about  increased risk of fracture, depending on the T score, greatly increased when the T score is lower than -2.5.   We reviewed her DEXA scans together, and I explained that based on the T scores, she has an increased risk for fractures, especially in light of her previous fragility fracture reports. -She was treated with Prolia during her last visit, ready for her next treatment.  She reports no complications from this treatment.    She currently has no dental procedures planned, she is edentulous.  It is still unsafe to resume her oral bisphosphonate therapy.   She will return in 6 months for her next Prolia injection preceded by CMP.    Pt was given reading information about osteoporosis and treatment options.    - we reviewed her dietary and supplemental calcium and vitamin D intake, which I believe are adequate.   - discussed fall precautions   - we discussed about maintaining a good amount of protein in her diet.    She will return for Prolia injection when her prescription is ready. She will have CMP repeated before her next visit in 6 months.  Regarding her multinodular goiter, she was previously completely worked up.  She has euthyroid presentation, would not need  any intervention with thyroid hormone.     - I advised patient to maintain close follow up with Smitty Cords, DO for primary care needs.   I spent  22  minutes in the care of the patient today including review of labs from Thyroid Function, CMP, and other relevant labs ; imaging/biopsy records (current and previous including abstractions from other facilities); face-to-face time discussing  her lab results and symptoms, medications doses, her options of short and long term treatment based on the latest standards of care / guidelines;   and documenting the encounter.  [name]  participated in the discussions, expressed understanding, and voiced agreement with the above plans.  All questions were answered  to her satisfaction. she is encouraged to contact clinic should she have any questions or concerns prior to her return visit.   Follow up plan: Return in about 6 months (around 08/21/2023) for F/U with Pre-visit Labs, Prolia Today and Prolia NV.   Marquis Lunch, MD [employer] [facility] [address] [place], [place] [address] Phone: [phone]  Fax: [phone]     02/20/2023, 11:57 AM  This note was partially dictated with voice recognition software. Similar sounding words can be transcribed inadequately or may not  be corrected upon review.

## 2023-02-20 NOTE — Progress Notes (Signed)
Pt seen for nurse visit for prolia injection. Pt brought her prolia with her. Prolia 59m/ml was given SQ in R quadrant of abdomen without any difficulty. Lot # [id number] Exp. Date 12/20/2024. Pt waited the required 15 minutes post injection, no adverse reactions noted.

## 2023-02-25 ENCOUNTER — Other Ambulatory Visit: Payer: Self-pay | Admitting: Internal Medicine

## 2023-02-26 NOTE — Telephone Encounter (Signed)
Requested Prescriptions  Pending Prescriptions Disp Refills   dicyclomine (BENTYL) 10 MG capsule [Pharmacy Med Name: Dicyclomine HCl Oral Capsule 10 MG] 90 capsule 0    Sig: TAKE 1 CAPSULE FOUR TIMES DAILY BEFORE MEALS AND AT BEDTIME. (NEED MD APPOINTMENT)     Gastroenterology:  Antispasmodic Agents Passed - 02/25/2023  7:06 PM      Passed - Valid encounter within last 12 months    Recent Outpatient Visits           8 months ago Annual physical exam   [place] [facility] Smitty Cords, DO   1 year ago Annual physical exam   [place] [facility] [place], Netta Neat, DO   2 years ago [employer], [employer]   [facility] Smitty Cords, DO   2 years ago Annual physical exam   [place] [facility] Smitty Cords, DO   3 years ago Elevated hemoglobin A1c   [place] [facility] Smitty Cords, DO       Future Appointments             In 3 months Althea Charon, Netta Neat, DO [place] [facility], [facility]

## 2023-03-04 ENCOUNTER — Other Ambulatory Visit: Payer: Self-pay

## 2023-03-11 ENCOUNTER — Ambulatory Visit: Payer: Medicare HMO | Attending: Cardiology

## 2023-03-11 DIAGNOSIS — Z9889 Other specified postprocedural states: Secondary | ICD-10-CM

## 2023-03-11 DIAGNOSIS — I059 Rheumatic mitral valve disease, unspecified: Secondary | ICD-10-CM | POA: Diagnosis not present

## 2023-03-11 DIAGNOSIS — I482 Chronic atrial fibrillation, unspecified: Secondary | ICD-10-CM

## 2023-03-11 DIAGNOSIS — I4891 Unspecified atrial fibrillation: Secondary | ICD-10-CM | POA: Diagnosis not present

## 2023-03-11 DIAGNOSIS — Z5181 Encounter for therapeutic drug level monitoring: Secondary | ICD-10-CM | POA: Diagnosis not present

## 2023-03-11 LAB — POCT INR: INR: 2 (ref 2.0–3.0)

## 2023-03-11 NOTE — Patient Instructions (Signed)
TAKE 2 TABLETS TODAY ONLY THEN Continue 1/2 a tablet daily except for 1 tablet on Wednesdays Recheck INR in 4 weeks

## 2023-03-14 ENCOUNTER — Encounter (HOSPITAL_COMMUNITY): Payer: Self-pay

## 2023-03-20 ENCOUNTER — Other Ambulatory Visit: Payer: Self-pay | Admitting: Family Medicine

## 2023-03-23 NOTE — Telephone Encounter (Signed)
Requested Prescriptions  Pending Prescriptions Disp Refills   dicyclomine (BENTYL) 10 MG capsule [Pharmacy Med Name: Dicyclomine HCl Oral Capsule 10 MG] 90 capsule 0    Sig: TAKE 1 CAPSULE FOUR TIMES DAILY BEFORE MEALS AND AT BEDTIME. (NEED MD APPOINTMENT)     Gastroenterology:  Antispasmodic Agents Passed - 03/20/2023 11:42 PM      Passed - Valid encounter within last 12 months    Recent Outpatient Visits           9 months ago Annual physical exam   [place] [facility] Smitty Cords, DO   1 year ago Annual physical exam   [place] [facility] [place], Netta Neat, DO   2 years ago [employer], [employer]   [place] [facility] Smitty Cords, DO   2 years ago Annual physical exam   [place] [facility] Smitty Cords, DO   3 years ago Elevated hemoglobin A1c   [place] [facility] Smitty Cords, DO       Future Appointments             In 2 months Althea Charon, Netta Neat, DO [facility], [facility]

## 2023-03-27 DIAGNOSIS — Z9841 Cataract extraction status, right eye: Secondary | ICD-10-CM | POA: Diagnosis not present

## 2023-03-27 DIAGNOSIS — Z961 Presence of intraocular lens: Secondary | ICD-10-CM | POA: Diagnosis not present

## 2023-03-27 DIAGNOSIS — Z9842 Cataract extraction status, left eye: Secondary | ICD-10-CM | POA: Diagnosis not present

## 2023-03-27 DIAGNOSIS — H04123 Dry eye syndrome of bilateral lacrimal glands: Secondary | ICD-10-CM | POA: Diagnosis not present

## 2023-03-28 ENCOUNTER — Other Ambulatory Visit: Payer: Self-pay | Admitting: Family Medicine

## 2023-03-28 DIAGNOSIS — J3089 Other allergic rhinitis: Secondary | ICD-10-CM

## 2023-03-28 DIAGNOSIS — I10 Essential (primary) hypertension: Secondary | ICD-10-CM

## 2023-03-30 NOTE — Telephone Encounter (Signed)
Requested Prescriptions  Pending Prescriptions Disp Refills   fluticasone (FLONASE) 50 MCG/ACT nasal spray [Pharmacy Med Name: Fluticasone Propionate Nasal Suspension 50 MCG/ACT] 48 g 3    Sig: USE 2 SPRAYS IN EACH NOSTRIL EVERY DAY     Ear, Nose, and Throat: Nasal Preparations - Corticosteroids Passed - 03/28/2023  1:58 AM      Passed - Valid encounter within last 12 months    Recent Outpatient Visits           9 months ago Annual physical exam   [place] [facility] [place], Netta Neat, DO   1 year ago Annual physical exam   [place] [facility] [place], Netta Neat, DO   2 years ago [employer], [employer]   [place] [facility] Smitty Cords, DO   2 years ago Annual physical exam   [place] [facility] [place], Netta Neat, DO   3 years ago Elevated hemoglobin A1c   [place] [facility] Althea Charon, Netta Neat, DO       Future Appointments             In 2 months Althea Charon, Netta Neat, DO [place] [facility], [facility]            Refused Prescriptions Disp Refills   lisinopril (ZESTRIL) 40 MG tablet [Pharmacy Med Name: Lisinopril Oral Tablet 40 MG] 90 tablet 3    Sig: TAKE 1 TABLET EVERY DAY     Cardiovascular:  ACE Inhibitors Passed - 03/28/2023  1:58 AM      Passed - Cr in normal range and within 180 days    Creat  Date Value Ref Range Status  06/03/2022 0.85 0.50 - 1.05 mg/dL Final   Creatinine, Ser  Date Value Ref Range Status  02/12/2023 0.97 0.57 - 1.00 mg/dL Final   Creatinine, Urine  Date Value Ref Range Status  08/18/2007 145.6  Final         Passed - K in normal range and within 180 days    Potassium  Date Value Ref Range Status  02/12/2023 4.5 3.5 - 5.2 mmol/L Final         Passed - Patient is not pregnant      Passed - Last BP in normal range    BP Readings from Last 1 Encounters:  02/20/23 112/76          Passed - Valid encounter within last 6 months    Recent Outpatient Visits           9 months ago Annual physical exam   [place] [facility] Smitty Cords, DO   1 year ago Annual physical exam   [place] [facility] [place], Netta Neat, DO   2 years ago [employer], [employer]   [place] [facility] Smitty Cords, DO   2 years ago Annual physical exam   [place] [facility] [place], Netta Neat, DO   3 years ago Elevated hemoglobin A1c   [place] [facility] Smitty Cords, DO       Future Appointments             In 2 months Althea Charon, Netta Neat, DO [facility], [facility]

## 2023-04-08 ENCOUNTER — Ambulatory Visit: Payer: Medicare HMO | Attending: Cardiology

## 2023-04-08 DIAGNOSIS — I482 Chronic atrial fibrillation, unspecified: Secondary | ICD-10-CM

## 2023-04-08 DIAGNOSIS — I4891 Unspecified atrial fibrillation: Secondary | ICD-10-CM | POA: Diagnosis not present

## 2023-04-08 DIAGNOSIS — Z9889 Other specified postprocedural states: Secondary | ICD-10-CM

## 2023-04-08 DIAGNOSIS — Z5181 Encounter for therapeutic drug level monitoring: Secondary | ICD-10-CM | POA: Diagnosis not present

## 2023-04-08 DIAGNOSIS — I059 Rheumatic mitral valve disease, unspecified: Secondary | ICD-10-CM | POA: Diagnosis not present

## 2023-04-08 LAB — POCT INR: INR: 2 (ref 2.0–3.0)

## 2023-04-08 NOTE — Patient Instructions (Signed)
TAKE 2 TABLETS TODAY ONLY THEN INCREASE TO 1/2 a tablet daily except for 1 tablet on Wednesdays and FRIDAYS. Recheck INR in 3 weeks

## 2023-04-11 ENCOUNTER — Other Ambulatory Visit: Payer: Self-pay | Admitting: Family Medicine

## 2023-04-11 DIAGNOSIS — E7800 Pure hypercholesterolemia, unspecified: Secondary | ICD-10-CM

## 2023-04-11 DIAGNOSIS — Z5181 Encounter for therapeutic drug level monitoring: Secondary | ICD-10-CM

## 2023-04-13 NOTE — Telephone Encounter (Signed)
Requested Prescriptions  Pending Prescriptions Disp Refills   atorvastatin (LIPITOR) 40 MG tablet [Pharmacy Med Name: Atorvastatin Calcium Oral Tablet 40 MG] 90 tablet 0    Sig: TAKE 1 TABLET EVERY DAY     Cardiovascular:  Antilipid - Statins Failed - 04/11/2023  5:23 AM      Failed - Lipid Panel in normal range within the last 12 months    Cholesterol, Total  Date Value Ref Range Status  10/01/2016 157 100 - 199 mg/dL Final   Cholesterol  Date Value Ref Range Status  06/03/2022 162 <200 mg/dL Final   LDL Cholesterol (Calc)  Date Value Ref Range Status  06/03/2022 80 mg/dL (calc) Final    Comment:    Reference range: <100 . Desirable range <100 mg/dL for primary prevention;   <70 mg/dL for patients with CHD or diabetic patients  with > or = 2 CHD risk factors. Marland Kitchen LDL-C is now calculated using the Martin-Hopkins  calculation, which is a validated novel method providing  better accuracy than the Friedewald equation in the  estimation of LDL-C.  Horald Pollen et al. Lenox Ahr. 1610;960(45): 2061-2068  (http://education.Quest[employer].com/faq/FAQ164)    Direct LDL  Date Value Ref Range Status  11/04/2010 192.5 mg/dL Final    Comment:    Optimal:  <100 mg/dLNear or Above Optimal:  100-129 mg/dLBorderline High:  130-159 mg/dLHigh:  160-189 mg/dLVery High:  >190 mg/dL   HDL  Date Value Ref Range Status  06/03/2022 64 > OR = 50 mg/dL Final  40/98/1191 46 >47 mg/dL Final   Triglycerides  Date Value Ref Range Status  06/03/2022 94 <150 mg/dL Final         Passed - Patient is not pregnant      Passed - Valid encounter within last 12 months    Recent Outpatient Visits           10 months ago Annual physical exam   [place] [facility] Smitty Cords, DO   1 year ago Annual physical exam   [place] [facility] [place], Netta Neat, DO   2 years ago [employer], [employer]   [place] [facility] Smitty Cords, DO   2 years ago Annual physical exam   [place] [place], Netta Neat, DO   3 years ago Elevated hemoglobin A1c   [place] [facility] Smitty Cords, DO       Future Appointments             In 2 months Althea Charon, Netta Neat, DO [place] [facility], [place]   In 3 months Lyman Bishop, Bettey Mare, NP [place] [facility]             furosemide (LASIX) 20 MG tablet [Pharmacy Med Name: Furosemide Oral Tablet 20 MG] 45 tablet 0    Sig: TAKE 1 TABLET EVERY OTHER DAY     Cardiovascular:  Diuretics - Loop Failed - 04/11/2023  5:23 AM      Failed - Mg Level in normal range and within 180 days    Magnesium  Date Value Ref Range Status  08/24/2007 2.1  Final         Passed - K in normal range and within 180 days    Potassium  Date Value Ref Range Status  02/12/2023 4.5 3.5 - 5.2 mmol/L Final         Passed - Ca  in normal range and within 180 days    Calcium  Date Value Ref Range Status  02/12/2023 9.2 8.7 - 10.3 mg/dL Final   Calcium, Ion  Date Value Ref Range Status  10/06/2008 1.16 1.12 - 1.32 mmol/L Final         Passed - Na in normal range and within 180 days    Sodium  Date Value Ref Range Status  02/12/2023 141 134 - 144 mmol/L Final         Passed - Cr in normal range and within 180 days    Creat  Date Value Ref Range Status  06/03/2022 0.85 0.50 - 1.05 mg/dL Final   Creatinine, Ser  Date Value Ref Range Status  02/12/2023 0.97 0.57 - 1.00 mg/dL Final   Creatinine, Urine  Date Value Ref Range Status  08/18/2007 145.6  Final         Passed - Cl in normal range and within 180 days    Chloride  Date Value Ref Range Status  02/12/2023 104 96 - 106 mmol/L Final         Passed - Last BP in normal range    BP Readings from Last 1 Encounters:  02/20/23 112/76         Passed - Valid encounter within last 6 months    Recent Outpatient Visits            10 months ago Annual physical exam   [place] [facility] Smitty Cords, DO   1 year ago Annual physical exam   [place] [facility] [place], Netta Neat, DO   2 years ago [employer], [employer]   [place] [facility] Smitty Cords, DO   2 years ago Annual physical exam   [place] [facility] [place], Netta Neat, DO   3 years ago Elevated hemoglobin A1c   [place] [facility] Smitty Cords, DO       Future Appointments             In 2 months Althea Charon, Netta Neat, DO  [facility], [place]   In 3 months Lyman Bishop, Bettey Mare, NP [facility]

## 2023-04-14 ENCOUNTER — Other Ambulatory Visit: Payer: Self-pay | Admitting: Family Medicine

## 2023-04-16 ENCOUNTER — Other Ambulatory Visit: Payer: Self-pay | Admitting: Family Medicine

## 2023-04-16 NOTE — Telephone Encounter (Signed)
Request is too soon for refill, last refill 03/23/23 for 90 days. OV needed for additional refills.  Requested Prescriptions  Pending Prescriptions Disp Refills   dicyclomine (BENTYL) 10 MG capsule [Pharmacy Med Name: Dicyclomine HCl Oral Capsule 10 MG] 90 capsule 0    Sig: TAKE 1 CAPSULE FOUR TIMES DAILY BEFORE MEALS AND AT BEDTIME. (NEED MD APPOINTMENT)     Gastroenterology:  Antispasmodic Agents Passed - 04/14/2023  8:29 PM      Passed - Valid encounter within last 12 months    Recent Outpatient Visits           10 months ago Annual physical exam   [place] [facility] Smitty Cords, DO   1 year ago Annual physical exam   [place] [facility] [place], Netta Neat, DO   2 years ago [employer], [employer]   [place] [facility] Smitty Cords, DO   2 years ago Annual physical exam   [place] [facility] Smitty Cords, DO   3 years ago Elevated hemoglobin A1c   [place] [facility] Smitty Cords, DO       Future Appointments             In 1 month Althea Charon, Netta Neat, DO [facility], [place]   In 3 months Lyman Bishop, Bettey Mare, NP [facility] at [facility]

## 2023-04-17 NOTE — Telephone Encounter (Signed)
Requested Prescriptions  Pending Prescriptions Disp Refills   dicyclomine (BENTYL) 10 MG capsule [Pharmacy Med Name: Dicyclomine HCl Oral Capsule 10 MG] 120 capsule 0    Sig: TAKE 1 CAPSULE FOUR TIMES DAILY BEFORE MEALS AND AT BEDTIME. (NEED MD APPOINTMENT)     Gastroenterology:  Antispasmodic Agents Passed - 04/16/2023  8:13 PM      Passed - Valid encounter within last 12 months    Recent Outpatient Visits           10 months ago Annual physical exam   [place] [facility] Smitty Cords, DO   1 year ago Annual physical exam   [place] [facility] [place], Netta Neat, DO   2 years ago [employer], [employer]   [place] [facility] Smitty Cords, DO   2 years ago Annual physical exam   [place] [facility] Smitty Cords, DO   3 years ago Elevated hemoglobin A1c   [place] [facility] Smitty Cords, DO       Future Appointments             In 1 month Althea Charon, Netta Neat, DO [facility], [place]   In 3 months Lyman Bishop, Bettey Mare, NP [facility] at [facility]

## 2023-04-28 ENCOUNTER — Other Ambulatory Visit: Payer: Self-pay | Admitting: Family Medicine

## 2023-04-28 DIAGNOSIS — Z1231 Encounter for screening mammogram for malignant neoplasm of breast: Secondary | ICD-10-CM

## 2023-04-29 ENCOUNTER — Ambulatory Visit: Payer: Medicare HMO | Attending: Cardiology

## 2023-04-29 DIAGNOSIS — I482 Chronic atrial fibrillation, unspecified: Secondary | ICD-10-CM

## 2023-04-29 DIAGNOSIS — Z5181 Encounter for therapeutic drug level monitoring: Secondary | ICD-10-CM | POA: Diagnosis not present

## 2023-04-29 DIAGNOSIS — I059 Rheumatic mitral valve disease, unspecified: Secondary | ICD-10-CM

## 2023-04-29 DIAGNOSIS — Z9889 Other specified postprocedural states: Secondary | ICD-10-CM

## 2023-04-29 DIAGNOSIS — I4891 Unspecified atrial fibrillation: Secondary | ICD-10-CM | POA: Diagnosis not present

## 2023-04-29 LAB — POCT INR: INR: 2.2 (ref 2.0–3.0)

## 2023-04-29 NOTE — Patient Instructions (Signed)
TAKE 1.5 TABLETS TODAY ONLY THEN INCREASE TO 1/2 a tablet daily except for 1 tablet on Mondays,  Wednesdays and Fridays. Recheck INR in 2 weeks

## 2023-05-01 ENCOUNTER — Ambulatory Visit
Admission: RE | Admit: 2023-05-01 | Discharge: 2023-05-01 | Disposition: A | Discharge status: Home / Self Care | Payer: Medicare HMO | Source: Ambulatory Visit | Attending: Family Medicine | Admitting: Family Medicine

## 2023-05-01 DIAGNOSIS — Z1231 Encounter for screening mammogram for malignant neoplasm of breast: Secondary | ICD-10-CM | POA: Diagnosis not present

## 2023-05-13 ENCOUNTER — Ambulatory Visit: Payer: Medicare HMO | Attending: Cardiology

## 2023-05-13 DIAGNOSIS — I4891 Unspecified atrial fibrillation: Secondary | ICD-10-CM

## 2023-05-13 DIAGNOSIS — I059 Rheumatic mitral valve disease, unspecified: Secondary | ICD-10-CM

## 2023-05-13 DIAGNOSIS — Z5181 Encounter for therapeutic drug level monitoring: Secondary | ICD-10-CM

## 2023-05-13 DIAGNOSIS — I482 Chronic atrial fibrillation, unspecified: Secondary | ICD-10-CM | POA: Diagnosis not present

## 2023-05-13 DIAGNOSIS — Z9889 Other specified postprocedural states: Secondary | ICD-10-CM

## 2023-05-13 LAB — POCT INR: INR: 2.8 (ref 2.0–3.0)

## 2023-05-13 NOTE — Patient Instructions (Signed)
Continue 1/2 a tablet daily except for 1 tablet on Mondays,  Wednesdays and Fridays. Recheck INR in 4 weeks

## 2023-05-19 ENCOUNTER — Other Ambulatory Visit: Payer: Self-pay | Admitting: Family Medicine

## 2023-05-19 NOTE — Telephone Encounter (Signed)
Requested Prescriptions  Pending Prescriptions Disp Refills   dicyclomine (BENTYL) 10 MG capsule [Pharmacy Med Name: Dicyclomine HCl Oral Capsule 10 MG] 360 capsule 0    Sig: TAKE 1 CAPSULE FOUR TIMES DAILY BEFORE MEALS AND AT BEDTIME. (NEED MD APPOINTMENT)     Gastroenterology:  Antispasmodic Agents Passed - 05/19/2023  8:21 AM      Passed - Valid encounter within last 12 months    Recent Outpatient Visits           11 months ago Annual physical exam   [place] [facility] Althea Charon, Netta Neat, DO   1 year ago Annual physical exam   [place] [facility] [place], Netta Neat, DO   2 years ago [employer]   [place] [facility] Smitty Cords, DO   2 years ago Annual physical exam   [place] [facility] Smitty Cords, DO   3 years ago Elevated hemoglobin A1c   [place] [facility] Smitty Cords, DO       Future Appointments             In 3 weeks Althea Charon, Netta Neat, DO  [facility], [place]   In 2 months Lyman Bishop, Bettey Mare, NP [facility]

## 2023-05-31 ENCOUNTER — Encounter: Payer: Self-pay | Admitting: *Deleted

## 2023-05-31 ENCOUNTER — Ambulatory Visit
Admission: EM | Admit: 2023-05-31 | Discharge: 2023-05-31 | Disposition: A | Discharge status: Home / Self Care | Payer: Medicare HMO | Attending: Emergency Medicine | Admitting: Emergency Medicine

## 2023-05-31 DIAGNOSIS — R319 Hematuria, unspecified: Secondary | ICD-10-CM | POA: Diagnosis not present

## 2023-05-31 DIAGNOSIS — N3001 Acute cystitis with hematuria: Secondary | ICD-10-CM | POA: Diagnosis not present

## 2023-05-31 LAB — POCT URINALYSIS DIP (MANUAL ENTRY)
Blood, UA: NEGATIVE
Glucose, UA: 100 mg/dL — AB
Leukocytes, UA: NEGATIVE
Nitrite, UA: POSITIVE — AB
Protein Ur, POC: 100 mg/dL — AB
Spec Grav, UA: >=1.03 — AB (ref 1.010–1.025)
Urobilinogen, UA: >=8 U/dL — AB
pH, UA: 5.5 (ref 5.0–8.0)

## 2023-05-31 MED ORDER — CEPHALEXIN 500 MG PO CAPS: 500.0000 mg | ORAL_CAPSULE | Freq: Three times a day (TID) | ORAL | 0 refills | Status: AC

## 2023-05-31 NOTE — Discharge Instructions (Signed)
Your urinalysis shows [name] blood cells and nitrates which are indicative of infection, your urine will be sent to the lab to determine exactly which bacteria is present, if any changes need to be made to your medications you will be notified  Begin use of cephalexin 3 times a day for 5 days  You may use over-the-counter Azo to help minimize your symptoms until antibiotic removes bacteria, this medication will turn your urine orange  Increase your fluid intake through use of water  As always practice good hygiene, wiping front to back and avoidance of scented vaginal products to prevent further irritation  If symptoms continue to persist after use of medication or recur please follow-up with urgent care or your primary doctor as needed

## 2023-05-31 NOTE — ED Provider Notes (Signed)
Renaldo Fiddler    CSN: [id number] Arrival date & time: 05/31/23  1138      History   Chief Complaint Chief Complaint  Patient presents with   Hematuria    HPI [name] is a 67 y.o. female.    Patient presents for evaluation of hematuria beginning 1 day ago.  Has not attempted treatment.  Denies frequency, urgency, dysuria, abdominal or flank pain, fever or vaginal symptoms.   Past Medical History:  Diagnosis Date   Anticoagulated on warfarin    Arthritis    Heart murmur    History of cardiac catheterization    a. 2008 Cath: nl cors.   History of hyperthyroidism    a. 2003--  PTU [place]   Hypercholesterolemia    Hyperlipidemia    Multinodular goiter    Permanent atrial fibrillation (HCC)    a. Dx 1997; b. CHA2DS2VASc = 3-->warfarin (s/p MVR as well); c. 06/2013 Holter: HRs freq in low 100's to 1-teens w/ max rate 179-->beta blocker increased.   Pulmonary hypertension, mild (HCC)    Rheumatic heart disease    S/P MVR (mitral valve replacement)    a. 1997 s/p SJM mechanical MVR 2/2 Sev MS; b. chronic warfarin; c. 10/2017 Echo: EF 55-60%, no rwma, triv AI. Nl fxn of MV prosthesis. Sev dil LA. Mildly to mod increased PA pressures.    Patient Active Problem List   Diagnosis Date Noted   Localized osteoporosis with current pathological fracture 07/31/2022   Special screening for malignant neoplasms, colon    Polyp of ascending colon    Dysphagia 04/18/2021   Overweight (BMI 25.0-29.9) 06/08/2018   Elevated hemoglobin A1c 06/18/2017   Allergic rhinitis 12/02/2016   Vitamin D deficiency 12/02/2016   S/P MVR (mitral valve replacement) 06/03/2016   Osteopenia 06/03/2016   Environmental and seasonal allergies 06/03/2016   History of epigastric pain 03/13/2016   Breast microcalcifications 10/15/2015   Piriformis syndrome 10/02/2015   History of femur fracture 04/26/2014   Encounter for therapeutic drug monitoring 07/19/2013   Choledocholithiasis 08/14/2011    Anticoagulated on Coumadin 08/14/2011   Mitral valve disease 08/15/2010   MITRAL VALVE REPLACEMENT, HX OF 04/24/2009   History of hyperthyroidism 04/19/2009   Essential hypertension 10/17/2008   Pulmonary hypertension (HCC) 10/17/2008   RHEUMATIC HEART DISEASE, HX OF 10/17/2008   Multinodular goiter 10/12/2007   HYPERCHOLESTEROLEMIA 10/12/2007   Chronic atrial fibrillation (HCC) 10/12/2007   GERD 10/12/2007    Past Surgical History:  Procedure Laterality Date   ANTERIOR CERVICAL DECOMP/DISCECTOMY FUSION  12-21-2001   C5 --  C6   BREAST BIOPSY Left 11/03/2016   Procedure: BREAST BIOPSY WITH NEEDLE LOCALIZATION;  Surgeon: Earline Mayotte, MD;  Location: [facility] ORS;  Service: General;  Laterality: Left;   BREAST EXCISIONAL BIOPSY Left 2018   CARDIAC CATHETERIZATION  12-28-2006  [facility]   NORMAL CORONARY ARTERIES   CARPAL TUNNEL RELEASE Right 01-20-2007   COLONOSCOPY  2015   COLONOSCOPY WITH PROPOFOL N/A 09/10/2021   Procedure: COLONOSCOPY WITH PROPOFOL;  Surgeon: Midge Minium, MD;  Location[facility] END[facility]COPY;  Service: Endoscopy;  Laterality: N/A;   ERCP  08/15/2011   Procedure: ENDOSCOPIC RETROGRADE CHOLANGIOPANCREATOGRAPHY (ERCP);  Surgeon: Petra Kuba, MD;  Location: [facility] ENDOSCOPY;  Service: Endoscopy;  Laterality: N/A;   ERCP     MULTIPLE--  INCLUDING STENTS, SPHINTEROTOMY'S  STONE REMOVAL    ESOPHAGOGASTRODUODENOSCOPY (EGD) WITH PROPOFOL  09/10/2021   Procedure: ESOPHAGOGASTRODUODENOSCOPY (EGD) WITH PROPOFOL;  Surgeon: Midge Minium, MD;  Location:  [facility] ENDOSCOPY;  Service: Endoscopy;;   FEMUR IM NAIL Right 04/26/2014   Procedure: INTRAMEDULLARY (IM) NAIL FEMORAL;  Surgeon: Harvie Junior, MD;  Location: [facility] OR;  Service: Orthopedics;  Laterality: Right;   I & D EXTREMITY Right 02/07/2013   Procedure: IRRIGATION AND DEBRIDEMENT OF RIGHT LOWER LEG WITH PLACEMENT OF ACELL AND WOULND VAC;  Surgeon: Wayland Denis, DO;  Location:  SURGERY CENTER;  Service: Plastics;  Laterality:  Right;   LAPAROSCOPIC CHOLECYSTECTOMY  04-08-2002   MITRAL VALVE REPLACEMENT  1997   ST JUDE   ORIF ANKLE FRACTURE Right 11/05/2012   Procedure: OPEN REDUCTION INTERNAL FIXATION (ORIF) ANKLE FRACTURE only operating on right;  Surgeon: Harvie Junior, MD;  Location: [facility] OR;  Service: Orthopedics;  Laterality: Right;   ORIF RIGHT ULNAR FX W/ ILIAC CREST BONE GRAFT  10-06-2008   ORIF WRIST FRACTURE Right 04/26/2014   Procedure: OPEN REDUCTION INTERNAL FIXATION (ORIF) WRIST FRACTURE;  Surgeon: Harvie Junior, MD;  Location: [facility] OR;  Service: Orthopedics;  Laterality: Right;   TRANSTHORACIC ECHOCARDIOGRAM  06-29-2009  dr wall   normal lvsf/ ef 55-60%/  normal bileaflet mechanical mvr/ moderated dilated la/ moderate tr   WRIST ARTHROSCOPY Right 12-10-2007   debridement and ulnar shortening osteotomy w/ plate    OB History   No obstetric history on file.      Home Medications    Prior to Admission medications   Medication Sig Start Date End Date Taking? Authorizing Provider  cephALEXin (KEFLEX) 500 MG capsule Take 1 capsule (500 mg total) by mouth 3 (three) times daily for 5 days. 05/31/23 06/05/23 Yes Valinda Hoar, NP  ABRYSVO 120 MCG/0.5ML injection  05/13/22   [provider]  acyclovir (ZOVIRAX) 400 MG tablet TAKE 1 TABLET BY MOUTH 3 TIMES DAILY FOR 5 TO 10 DAYS OR UNTIL HEALED FOR FEVER BLISTER. 06/10/22   Karamalegos, Netta Neat, DO  aspirin EC 81 MG tablet Take 1 tablet (81 mg total) by mouth daily. 07/17/16   Lewayne Bunting, MD  atorvastatin (LIPITOR) 40 MG tablet TAKE 1 TABLET EVERY DAY 04/13/23   Karamalegos, Netta Neat, DO  calcium carbonate (OSCAL) 1500 (600 Ca) MG TABS tablet Take 1 tablet by mouth daily.     [provider]  cetirizine (ZYRTEC) 10 MG tablet Take 1 tablet (10 mg total) by mouth daily. 10/17/22   Karamalegos, Netta Neat, DO  Cholecalciferol (VITAMIN D3) 2000 units capsule Take 1 capsule (2,000 Units total) by mouth daily. 06/18/17    Karamalegos, Netta Neat, DO  denosumab (PROLIA) 60 MG/ML SOSY injection Inject 60 mg into the skin every 6 (six) months. 10/08/22   Roma Kayser, MD  dicyclomine (BENTYL) 10 MG capsule TAKE 1 CAPSULE FOUR TIMES DAILY BEFORE MEALS AND AT BEDTIME. (NEED MD APPOINTMENT) 05/19/23   Althea Charon, Netta Neat, DO  fluticasone [facility]) 50 MCG/ACT nasal spray USE 2 SPRAYS IN [facility] NOSTRIL EVERY DAY 03/30/23   Althea Charon, Netta Neat, DO  furosemide (LASIX) 20 MG tablet TAKE 1 TABLET EVERY OTHER DAY 04/13/23   Althea Charon, Netta Neat, DO  lisinopril (ZESTRIL) 40 MG tablet Take 1 tablet (40 mg total) by mouth daily. 06/10/22   Karamalegos, Netta Neat, DO  methocarbamol (ROBAXIN) 500 MG tablet TAKE 1 TABLET FOUR TIMES DAILY AS NEEDED FOR MUSCLE SPASMS (PAIN) 06/10/22   Karamalegos, Netta Neat, DO  metoprolol succinate (TOPROL-XL) 100 MG 24 hr tablet Take 1.5 tablets (150 mg total) by mouth daily. Take with or immediately following a meal.  08/01/22   Jodelle Gross, NP  Multiple Vitamins-Minerals (CENTRUM SILVER ADULT 50+ PO) Take 1 tablet by mouth daily.    [provider]  triamcinolone cream (KENALOG) 0.5 % Apply 1 application topically 2 (two) times daily. To affected areas, for up to 2 weeks. 12/05/20   Karamalegos, Netta Neat, DO  warfarin (COUMADIN) 5 MG tablet TAKE 1/2 TO 1 TABLET DAILY AS DIRECTED BY COUMADIN CLINIC 02/11/23   Lewayne Bunting, MD    Family History Family History  Problem Relation Age of Onset   Hypothyroidism Sister    Gallbladder disease Sister    Coronary artery disease Other    Diabetes Mother    Hypertension Mother    Heart disease Father    Stroke Father    Breast cancer Neg Hx     Social History Social History   Tobacco Use   Smoking status: Never   Smokeless tobacco: Never  Vaping Use   Vaping status: Never Used  Substance Use Topics   Alcohol use: No   Drug use: No     Allergies   Patient has no known allergies.   Review of  Systems Review of Systems  Genitourinary:  Positive for hematuria.     Physical Exam Triage Vital Signs ED Triage Vitals  Encounter Vitals Group     BP 05/31/23 1250 123/76     Systolic BP Percentile --      Diastolic BP Percentile --      Pulse Rate 05/31/23 1250 99     Resp 05/31/23 1250 18     Temp 05/31/23 1250 98 F (36.7 C)     Temp Source 05/31/23 1250 Oral     SpO2 05/31/23 1250 97 %     Weight 05/31/23 1247 157 lb (71.2 kg)     Height 05/31/23 1247 5\' 4"  (1.626 m)     Head Circumference --      Peak Flow --      Pain Score 05/31/23 1247 0     Pain Loc --      Pain Education --      Exclude from Growth Chart --    No data found.  Updated Vital Signs BP 123/76 (BP Location: Left Arm)   Pulse 99   Temp 98 F (36.7 C) (Oral)   Resp 18   Ht 5\' 4"  (1.626 m)   Wt 157 lb (71.2 kg)   SpO2 97%   BMI 26.95 kg/m   Visual Acuity Right Eye Distance:   Left Eye Distance:   Bilateral Distance:    Right Eye Near:   Left Eye Near:    Bilateral Near:     Physical Exam Constitutional:      Appearance: Normal appearance.  Eyes:     Extraocular Movements: Extraocular movements intact.  Pulmonary:     Effort: Pulmonary effort is normal.  Abdominal:     General: Abdomen is flat.     Palpations: Abdomen is soft.     Tenderness: There is no right CVA tenderness or left CVA tenderness.  Neurological:     Mental Status: She is alert and oriented to person, place, and time. Mental status is at baseline.      UC Treatments / Results  Labs (all labs ordered are listed, but only abnormal results are displayed) Labs Reviewed  POCT URINALYSIS DIP (MANUAL ENTRY) - Abnormal; Notable for the following components:      Result Value   Color, UA other (*)  Clarity, UA cloudy (*)    Glucose, UA =100 (*)    Bilirubin, UA large (*)    Ketones, POC UA trace (5) (*)    Spec Grav, UA >=1.030 (*)    Protein Ur, POC =100 (*)    Urobilinogen, UA >=8.0 (*)    Nitrite, UA  Positive (*)    All other components within normal limits  URINE CULTURE    EKG   Radiology No results found.  Procedures Procedures (including critical care time)  Medications Ordered in UC Medications - No data to display  Initial Impression / Assessment and Plan / UC Course  I have reviewed the triage vital signs and the nursing notes.  Pertinent labs & imaging results that were available during my care of the patient were reviewed by me and considered in my medical decision making (see chart for details).   Acute cystitis with hematuria, hematuria  Urinalysis showing nitrates, sent for culture, prescribed cephalexin and recommended additional supportive measures, may follow-up as needed Final Clinical Impressions(s) / UC Diagnoses   Final diagnoses:  Hematuria, unspecified type  Acute cystitis with hematuria     Discharge Instructions      Your urinalysis shows Paullette Mckain blood cells and nitrates which are indicative of infection, your urine will be sent to the lab to determine exactly which bacteria is present, if any changes need to be made to your medications you will be notified  Begin use of cephalexin 3 times a day for 5 days  You may use over-the-counter Azo to help minimize your symptoms until antibiotic removes bacteria, this medication will turn your urine orange  Increase your fluid intake through use of water  As always practice good hygiene, wiping front to back and avoidance of scented vaginal products to prevent further irritation  If symptoms continue to persist after use of medication or recur please follow-up with urgent care or your primary doctor as needed    ED Prescriptions     Medication Sig Dispense Auth. Provider   cephALEXin (KEFLEX) 500 MG capsule Take 1 capsule (500 mg total) by mouth 3 (three) times daily for 5 days. 15 capsule Kodie Pick, Elita Boone, NP      PDMP not reviewed this encounter.   Valinda Hoar, NP 05/31/23 1315

## 2023-05-31 NOTE — ED Triage Notes (Signed)
Patient states blood in urine since yesterday.

## 2023-06-01 LAB — URINE CULTURE: Culture: <10000 — AB

## 2023-06-10 ENCOUNTER — Ambulatory Visit: Payer: Medicare HMO | Attending: Cardiology

## 2023-06-10 DIAGNOSIS — I482 Chronic atrial fibrillation, unspecified: Secondary | ICD-10-CM

## 2023-06-10 DIAGNOSIS — I4891 Unspecified atrial fibrillation: Secondary | ICD-10-CM | POA: Diagnosis not present

## 2023-06-10 DIAGNOSIS — Z5181 Encounter for therapeutic drug level monitoring: Secondary | ICD-10-CM | POA: Diagnosis not present

## 2023-06-10 DIAGNOSIS — Z9889 Other specified postprocedural states: Secondary | ICD-10-CM | POA: Diagnosis not present

## 2023-06-10 DIAGNOSIS — I059 Rheumatic mitral valve disease, unspecified: Secondary | ICD-10-CM | POA: Diagnosis not present

## 2023-06-10 LAB — POCT INR: INR: 3.9 — AB (ref 2.0–3.0)

## 2023-06-10 NOTE — Patient Instructions (Signed)
Hold today only then Continue 1/2 a tablet daily except for 1 tablet on Mondays,  Wednesdays and Fridays. Recheck INR in 4 weeks

## 2023-06-11 ENCOUNTER — Other Ambulatory Visit: Payer: Self-pay | Admitting: Family Medicine

## 2023-06-11 ENCOUNTER — Other Ambulatory Visit: Payer: Self-pay | Admitting: Adult Health

## 2023-06-11 DIAGNOSIS — I482 Chronic atrial fibrillation, unspecified: Secondary | ICD-10-CM

## 2023-06-11 DIAGNOSIS — I10 Essential (primary) hypertension: Secondary | ICD-10-CM

## 2023-06-11 DIAGNOSIS — J3089 Other allergic rhinitis: Secondary | ICD-10-CM

## 2023-06-11 NOTE — Telephone Encounter (Signed)
Requested Prescriptions  Pending Prescriptions Disp Refills   fluticasone (FLONASE) 50 MCG/ACT nasal spray [Pharmacy Med Name: Fluticasone Propionate Nasal Suspension 50 MCG/ACT] 48 g 0    Sig: USE 2 SPRAYS IN EACH NOSTRIL EVERY DAY     Ear, Nose, and Throat: Nasal Preparations - Corticosteroids Passed - 06/11/2023  9:52 AM      Passed - Valid encounter within last 12 months    Recent Outpatient Visits           1 year ago Annual physical exam   [place] [facility] [place], Netta Neat, DO   2 years ago Annual physical exam   [place] [facility] [place], Netta Neat, DO   2 years ago [employer]   [facility] Smitty Cords, DO   3 years ago Annual physical exam   [place] [facility] [place], Netta Neat, DO   3 years ago Elevated hemoglobin A1c   [facility] [place], Netta Neat, DO       Future Appointments             Tomorrow Althea Charon, Netta Neat, DO  [facility], [place]   In 1 month Lyman Bishop, Bettey Mare, NP [facility] at [facility]

## 2023-06-12 ENCOUNTER — Ambulatory Visit (INDEPENDENT_AMBULATORY_CARE_PROVIDER_SITE_OTHER): Payer: Medicare HMO | Admitting: Family Medicine

## 2023-06-12 ENCOUNTER — Encounter: Payer: Self-pay | Admitting: Family Medicine

## 2023-06-12 ENCOUNTER — Other Ambulatory Visit: Payer: Self-pay | Admitting: Family Medicine

## 2023-06-12 VITALS — BP 138/86 | HR 106 | Ht 64.0 in | Wt 173.0 lb

## 2023-06-12 DIAGNOSIS — B001 Herpesviral vesicular dermatitis: Secondary | ICD-10-CM

## 2023-06-12 DIAGNOSIS — R140 Abdominal distension (gaseous): Secondary | ICD-10-CM

## 2023-06-12 DIAGNOSIS — I4891 Unspecified atrial fibrillation: Secondary | ICD-10-CM | POA: Diagnosis not present

## 2023-06-12 DIAGNOSIS — E042 Nontoxic multinodular goiter: Secondary | ICD-10-CM

## 2023-06-12 DIAGNOSIS — Z0000 Encounter for general adult medical examination without abnormal findings: Secondary | ICD-10-CM

## 2023-06-12 DIAGNOSIS — R7309 Other abnormal glucose: Secondary | ICD-10-CM

## 2023-06-12 DIAGNOSIS — E7800 Pure hypercholesterolemia, unspecified: Secondary | ICD-10-CM

## 2023-06-12 DIAGNOSIS — Z5181 Encounter for therapeutic drug level monitoring: Secondary | ICD-10-CM | POA: Diagnosis not present

## 2023-06-12 DIAGNOSIS — I10 Essential (primary) hypertension: Secondary | ICD-10-CM | POA: Diagnosis not present

## 2023-06-12 DIAGNOSIS — R109 Unspecified abdominal pain: Secondary | ICD-10-CM

## 2023-06-12 MED ORDER — ACYCLOVIR 400 MG PO TABS: ORAL_TABLET | ORAL | 3 refills | Status: DC

## 2023-06-12 MED ORDER — DICYCLOMINE HCL 10 MG PO CAPS: 10.0000 mg | ORAL_CAPSULE | Freq: Two times a day (BID) | ORAL | 3 refills | Status: AC | PRN

## 2023-06-12 MED ORDER — ATORVASTATIN CALCIUM 40 MG PO TABS: 40.0000 mg | ORAL_TABLET | Freq: Every day | ORAL | 3 refills | Status: DC

## 2023-06-12 MED ORDER — FUROSEMIDE 20 MG PO TABS: 20.0000 mg | ORAL_TABLET | ORAL | 3 refills | Status: DC

## 2023-06-12 MED ORDER — LISINOPRIL 40 MG PO TABS: 40.0000 mg | ORAL_TABLET | Freq: Every day | ORAL | 3 refills | Status: DC

## 2023-06-12 NOTE — Patient Instructions (Addendum)
Thank you for coming to the office today.  Up to date on screenings  Refills ordered  Labs ordered today  DUE for FASTING BLOOD WORK (no food or drink after midnight before the lab appointment, only water or coffee without cream/sugar on the morning of)  SCHEDULE "Lab Only" visit in the morning at the clinic for lab draw in 1 YEAR  - Make sure Lab Only appointment is at about 1 week before your next appointment, so that results will be available  For Lab Results, once available within 2-3 days of blood draw, you can can log in to MyChart online to view your results and a brief explanation. Also, we can discuss results at next follow-up visit.   Please schedule a Follow-up Appointment to: Return for 1 year fasting lab > 1 week later Annual Physical.  If you have any other questions or concerns, please feel free to call the office or send a message through MyChart. You may also schedule an earlier appointment if necessary.  Additionally, you may be receiving a survey about your experience at our office within a few days to 1 week by e-mail or mail. We value your feedback.  Saralyn Pilar, DO [facility], [place]

## 2023-06-12 NOTE — Assessment & Plan Note (Signed)
Pending A1c Below PreDM Not on any therapy currently - remain off Encourage improved lifestyle - low carb, low sugar diet, limit sweet tea intake, reduce portion size, continue improving regular exercise

## 2023-06-12 NOTE — Progress Notes (Signed)
Subjective:    Patient ID: [name], female    DOB: 11/04/1955, 67 y.o.   MRN: [id number]  [name] is a 67 y.o. female presenting on 06/12/2023 for Annual Exam   HPI  Discussed the use of AI scribe software for clinical note transcription with the patient, who gave verbal consent to proceed.  History of Present Illness      The patient also reported occasional stomach spasms, which she manages with as-needed dicyclomine. She noted an increase in the frequency of these spasms recently, necessitating a refill of her medication.     Here for Annual Physical and Lab Review.   Follow-up Thyroid Nodules Low TSH Repeat labs normalized TSH T4. No diagnosis of hypothyroidism Never on medication   Overweight BMI >29 Weight stable - Diet: No particular diet. Improving water intake - Exercise:  improving gradual    CHRONIC HTN: Reports no new concerns Current Meds - Lisinopril 40mg  daily, Metoprolol XL 150mg  daily   Reports good compliance, took meds today. Tolerating well, w/o complaints. Denies CP, dyspnea, HA, edema, dizziness / lightheadedness    Chronic Atrial Fibrillation, s/p Mitral Valve Replacement, chronic anticoagulation Pulmonary HTN / PAD - Followed by [facility] Card[facility] Dr Jens Som, continues to f/u with [facility], she had ECHO on 11/12/17 with normal LVEF and mild mod pulm HTN, lasix 20mg  every other day was added. Otherwise remains on anticoagulation with coumadin for s/p MVR, rate control for permanent AFib. - They said her heart valve is expired but functioning well - Potassium was normal range. - She has followed Cardiology w/ variable INR, has repeat today.    Insomnia, sleep onset Difficulty falling asleep  ----------------------------  Recent UC visit 05/31/23 had some gross hematuria small amount, urine dipstick suggested infection. Urine culture negative, she was discontinued on Keflex, asymptomatic doing well now still.     Health Maintenance:   UTD PNEUMONIA vaccine Prevnar20   Flu Shot updated RSV Shot completed   Considering Shingles vaccine  Colonoscopy 09/10/21 negative repeat 10 yr.  Mammogram completed November 2024      06/12/2023    8:05 AM 01/29/2023    8:35 AM 06/10/2022    8:02 AM  Depression screen PHQ 2/9  Decreased Interest 0 0 0  Down, Depressed, Hopeless 0 0 0  PHQ - 2 Score 0 0 0  Altered sleeping  0 0  Tired, decreased energy  0 0  Change in appetite  0 0  Feeling bad or failure about yourself   0 0  Trouble concentrating  0 0  Moving slowly or fidgety/restless  0 0  Suicidal thoughts  0 0  PHQ-9 Score  0 0  Difficult doing work/chores  Not difficult at all Not difficult at all       06/12/2023    8:05 AM 12/05/2020    8:00 AM 10/02/2015   10:25 AM  GAD 7 : Generalized Anxiety Score  Nervous, Anxious, on Edge 0 0 1  Control/stop worrying 0 0 0  Worry too much - different things 0 0 0  Trouble relaxing 0 0 2  Restless 0 0 2  Easily annoyed or irritable 0 0 0  Afraid - awful might happen 0 0 0  Total GAD 7 Score 0 0 5  Anxiety Difficulty  Not difficult at all Somewhat difficult     Past Medical History:  Diagnosis Date   Anticoagulated on warfarin    Arthritis    Heart murmur  History of cardiac catheterization    a. 2008 Cath: nl cors.   History of hyperthyroidism    a. 2003--  PTU [place]   Hypercholesterolemia    Hyperlipidemia    Multinodular goiter    Permanent atrial fibrillation (HCC)    a. Dx 1997; b. CHA2DS2VASc = 3-->warfarin (s/p MVR as well); c. 06/2013 Holter: HRs freq in low 100's to 1-teens w/ max rate 179-->beta blocker increased.   Pulmonary hypertension, mild (HCC)    Rheumatic heart disease    S/P MVR (mitral valve replacement)    a. 1997 s/p SJM mechanical MVR 2/2 Sev MS; b. chronic warfarin; c. 10/2017 Echo: EF 55-60%, no rwma, triv AI. Nl fxn of MV prosthesis. Sev dil LA. Mildly to mod increased PA pressures.   Past Surgical History:   Procedure Laterality Date   ANTERIOR CERVICAL DECOMP/DISCECTOMY FUSION  12-21-2001   C5 --  C6   BREAST BIOPSY Left 11/03/2016   Procedure: BREAST BIOPSY WITH NEEDLE LOCALIZATION;  Surgeon: Earline Mayotte, MD;  Location: [facility] ORS;  Service: General;  Laterality: Left;   BREAST EXCISIONAL BIOPSY Left 2018   CARDIAC CATHETERIZATION  12-28-2006  [facility]   NORMAL CORONARY ARTERIES   CARPAL TUNNEL RELEASE Right 01-20-2007   COLONOSCOPY  2015   COLONOSCOPY WITH PROPOFOL N/A 09/10/2021   Procedure: COLONOSCOPY WITH PROPOFOL;  Surgeon: Midge Minium, MD;  Location: [facility] ENDOSCOPY;  Service: Endoscopy;  Laterality: N/A;   ERCP  08/15/2011   Procedure: ENDOSCOPIC RETROGRADE CHOLANGIOPANCREATOGRAPHY (ERCP);  Surgeon: Petra Kuba, MD;  Location: [facility] ENDOSCOPY;  Service: Endoscopy;  Laterality: N/A;   ERCP     MULTIPLE--  INCLUDING STENTS, SPHINTEROTOMY'S  STONE REMOVAL    ESOPHAGOGASTRODUODENOSCOPY (EGD) WITH PROPOFOL  09/10/2021   Procedure: ESOPHAGOGASTRODUODENOSCOPY (EGD) WITH PROPOFOL;  Surgeon: Midge Minium, MD;  Location: [facility] ENDOSCOPY;  Service: Endoscopy;;   FEMUR IM NAIL Right 04/26/2014   Procedure: INTRAMEDULLARY (IM) NAIL FEMORAL;  Surgeon: Harvie Junior, MD;  Location: [facility] OR;  Service: Orthopedics;  Laterality: Right;   I & D EXTREMITY Right 02/07/2013   Procedure: IRRIGATION AND DEBRIDEMENT OF RIGHT LOWER LEG WITH PLACEMENT OF ACELL AND WOULND VAC;  Surgeon: Wayland Denis, DO;  Location: [facility];  Service: Plastics;  Laterality: Right;   LAPAROSCOPIC CHOLECYSTECTOMY  04-08-2002   MITRAL VALVE REPLACEMENT  1997   [facility]   ORIF ANKLE FRACTURE Right 11/05/2012   Procedure: OPEN REDUCTION INTERNAL FIXATION (ORIF) ANKLE FRACTURE only operating on right;  Surgeon: Harvie Junior, MD;  Location: [facility] OR;  Service: Orthopedics;  Laterality: Right;   ORIF RIGHT ULNAR FX W/ ILIAC CREST BONE GRAFT  10-06-2008   ORIF WRIST FRACTURE Right 04/26/2014   Procedure: OPEN REDUCTION  INTERNAL FIXATION (ORIF) WRIST FRACTURE;  Surgeon: Harvie Junior, MD;  Location: [facility] OR;  Service: Orthopedics;  Laterality: Right;   TRANSTHORACIC ECHOCARDIOGRAM  06-29-2009  dr wall   normal lvsf/ ef 55-60%/  normal bileaflet mechanical mvr/ moderated dilated la/ moderate tr   WRIST ARTHROSCOPY Right 12-10-2007   debridement and ulnar shortening osteotomy w/ plate   Social History   Socioeconomic History   Marital status: Divorced    Spouse name: Not on file   Number of children: 0   Years of education: 12   Highest education level: 12th grade  Occupational History   Occupation: Geologist, engineering    Comment: retired  Tobacco Use   Smoking status: Never   Smokeless tobacco: Never  Advertising account planner  Vaping status: Never Used  Substance and Sexual Activity   Alcohol use: No   Drug use: No   Sexual activity: Not on file  Other Topics Concern   Not on file  Social History Narrative   Not on file   Social Drivers of Health   Financial Resource Strain: Low Risk  (01/29/2023)   Overall Financial Resource Strain (CARDIA)    Difficulty of Paying Living Expenses: Not hard at all  Food Insecurity: No Food Insecurity (01/29/2023)   Hunger Vital Sign    Worried About Running Out of Food in the Last Year: Never true    Ran Out of Food in the Last Year: Never true  Transportation Needs: No Transportation Needs (01/29/2023)   PRAPARE - Administrator, Civil Service (Medical): No    Lack of Transportation (Non-Medical): No  Physical Activity: Insufficiently Active (01/29/2023)   Exercise Vital Sign    Days of Exercise per Week: 2 days    Minutes of Exercise per Session: 20 min  Stress: No Stress Concern Present (01/29/2023)   [employer] of Occupational Health - Occupational Stress Questionnaire    Feeling of Stress : Not at all  Social Connections: Moderately Isolated (01/29/2023)   Social Connection and Isolation Panel [NHANES]    Frequency of Communication with Friends and  Family: More than three times a week    Frequency of Social Gatherings with Friends and Family: More than three times a week    Attends Religious Services: More than 4 times per year    Active Member of [employer] or Organizations: No    Attends Banker Meetings: Never    Marital Status: Divorced  Catering manager Violence: Not At Risk (01/29/2023)   Humiliation, Afraid, Rape, and Kick questionnaire    Fear of Current or Ex-Partner: No    Emotionally Abused: No    Physically Abused: No    Sexually Abused: No   Family History  Problem Relation Age of Onset   Hypothyroidism Sister    Gallbladder disease Sister    Coronary artery disease Other    Diabetes Mother    Hypertension Mother    Heart disease Father    Stroke Father    Breast cancer Neg Hx    Current Outpatient Medications on File Prior to Visit  Medication Sig   aspirin EC 81 MG tablet Take 1 tablet (81 mg total) by mouth daily.   calcium carbonate (OSCAL) 1500 (600 Ca) MG TABS tablet Take 1 tablet by mouth daily.    cetirizine (ZYRTEC) 10 MG tablet Take 1 tablet (10 mg total) by mouth daily.   Cholecalciferol (VITAMIN D3) 2000 units capsule Take 1 capsule (2,000 Units total) by mouth daily.   denosumab (PROLIA) 60 MG/ML SOSY injection Inject 60 mg into the skin every 6 (six) months.   fluticasone (FLONASE) 50 MCG/ACT nasal spray USE 2 SPRAYS IN EACH NOSTRIL EVERY DAY   methocarbamol (ROBAXIN) 500 MG tablet TAKE 1 TABLET FOUR TIMES DAILY AS NEEDED FOR MUSCLE SPASMS (PAIN)   metoprolol succinate (TOPROL-XL) 100 MG 24 hr tablet TAKE 1 AND 1/2 TABLETS EVERY DAY WITH OR IMMEDIATELY FOLLOWING A MEAL.   Multiple Vitamins-Minerals (CENTRUM SILVER ADULT 50+ PO) Take 1 tablet by mouth daily.   warfarin (COUMADIN) 5 MG tablet TAKE 1/2 TO 1 TABLET DAILY AS DIRECTED BY COUMADIN CLINIC   ABRYSVO 120 MCG/0.5ML injection  (Patient not taking: Reported on 06/12/2023)   triamcinolone cream (KENALOG) 0.5 % Apply 1  application  topically 2 (two) times daily. To affected areas, for up to 2 weeks. (Patient not taking: Reported on 06/12/2023)   Current Facility-Administered Medications on File Prior to Visit  Medication   denosumab (PROLIA) injection 60 mg    Review of Systems  Constitutional:  Negative for activity change, appetite change, chills, diaphoresis, fatigue and fever.  HENT:  Negative for congestion and hearing loss.   Eyes:  Negative for visual disturbance.  Respiratory:  Negative for cough, chest tightness, shortness of breath and wheezing.   Cardiovascular:  Negative for chest pain, palpitations and leg swelling.  Gastrointestinal:  Negative for abdominal pain, constipation, diarrhea, nausea and vomiting.  Genitourinary:  Negative for dysuria, frequency and hematuria.  Musculoskeletal:  Negative for arthralgias and neck pain.  Skin:  Negative for rash.  Neurological:  Negative for dizziness, weakness, light-headedness, numbness and headaches.  Hematological:  Negative for adenopathy.  Psychiatric/Behavioral:  Negative for behavioral problems, dysphoric mood and sleep disturbance.    Per HPI unless specifically indicated above     Objective:    BP 138/86   Pulse (!) 106   Ht 5\' 4"  (1.626 m)   Wt 173 lb (78.5 kg)   SpO2 95%   BMI 29.70 kg/m   Wt Readings from Last 3 Encounters:  06/12/23 173 lb (78.5 kg)  05/31/23 157 lb (71.2 kg)  02/20/23 172 lb (78 kg)    Physical Exam Vitals and nursing note reviewed.  Constitutional:      General: She is not in acute distress.    Appearance: She is well-developed. She is not diaphoretic.     Comments: Well-appearing, comfortable, cooperative  HENT:     Head: Normocephalic and atraumatic.  Eyes:     General:        Right eye: No discharge.        Left eye: No discharge.     Conjunctiva/sclera: Conjunctivae normal.  Neck:     Thyroid: No thyromegaly.  Cardiovascular:     Rate and Rhythm: Tachycardia present. Rhythm irregular.     Heart  sounds: Murmur (clicking murmur baseline valve) heard.  Pulmonary:     Effort: Pulmonary effort is normal. No respiratory distress.     Breath sounds: Normal breath sounds. No wheezing or rales.  Musculoskeletal:        General: Normal range of motion.     Cervical back: Normal range of motion and neck supple.  Lymphadenopathy:     Cervical: No cervical adenopathy.  Skin:    General: Skin is warm and dry.     Findings: No erythema or rash.  Neurological:     Mental Status: She is alert and oriented to person, place, and time.  Psychiatric:        Behavior: Behavior normal.     Comments: Well groomed, good eye contact, normal speech and thoughts    No results for input(s): "HGBA1C" in the last 8760 hours.    Chemistry      Component Value Date/Time   NA 141 02/12/2023 0910   K 4.5 02/12/2023 0910   CL 104 02/12/2023 0910   CO2 25 02/12/2023 0910   BUN 15 02/12/2023 0910   CREATININE 0.97 02/12/2023 0910   CREATININE 0.85 06/03/2022 0808      Component Value Date/Time   CALCIUM 9.2 02/12/2023 0910   ALKPHOS 93 02/12/2023 0910   AST 25 02/12/2023 0910   ALT 15 02/12/2023 0910   BILITOT 0.8 02/12/2023 0910  Results for orders placed or performed in visit on 06/10/23  POCT INR   Collection Time: 06/10/23  7:34 AM  Result Value Ref Range   INR 3.9 (A) 2.0 - 3.0   POC INR        Assessment & Plan:   Problem List Items Addressed This Visit     Elevated hemoglobin A1c   Pending A1c Below PreDM Not on any therapy currently - remain off Encourage improved lifestyle - low carb, low sugar diet, limit sweet tea intake, reduce portion size, continue improving regular exercise      Relevant Orders   Hemoglobin A1c   Encounter for therapeutic drug monitoring   Relevant Medications   furosemide (LASIX) 20 MG tablet   Essential hypertension   Controlled HTN repeat manual improved - Home BP readings stable  Complicated by chronic Atrial Fibrillation, MVR, Pulm  HTN    Plan:  1. Continue current BP regimen Lisinopril 40mg  daily, Metoprolol XL 150mg  daily 2. Encourage improved lifestyle - low sodium diet, regular exercise 3. Continue monitor BP outside office, bring readings to next visit, if persistently >140/90 or new symptoms notify office sooner      Relevant Medications   lisinopril (ZESTRIL) 40 MG tablet   atorvastatin (LIPITOR) 40 MG tablet   furosemide (LASIX) 20 MG tablet   Other Relevant Orders   CBC with Differential/Platelet   COMPLETE METABOLIC PANEL WITH GFR   HYPERCHOLESTEROLEMIA   Controlled cholesterol on statin and lifestyle Pending lab. Last lipid panel 05/2022  Plan: 1. Continue current meds - Atorvastatin 40mg  2. Encourage improved lifestyle - low carb/cholesterol, reduce portion size, continue improving regular exercise      Relevant Medications   lisinopril (ZESTRIL) 40 MG tablet   atorvastatin (LIPITOR) 40 MG tablet   furosemide (LASIX) 20 MG tablet   Other Relevant Orders   Lipid panel   COMPLETE METABOLIC PANEL WITH GFR   Multinodular goiter   Stable No active goiter or nodule Pending Thyroid panel normal Off Levothyroxine      Other Visit Diagnoses       Annual physical exam    -  Primary   Relevant Orders   Hemoglobin A1c   Lipid panel   CBC with Differential/Platelet   COMPLETE METABOLIC PANEL WITH GFR   TSH     Nontoxic multinodular goiter       Relevant Orders   TSH     Atrial fibrillation, unspecified type (HCC)       Relevant Medications   lisinopril (ZESTRIL) 40 MG tablet   atorvastatin (LIPITOR) 40 MG tablet   furosemide (LASIX) 20 MG tablet   Other Relevant Orders   CBC with Differential/Platelet     Abdominal bloating with cramps       Relevant Medications   dicyclomine (BENTYL) 10 MG capsule     Fever blister       Relevant Medications   acyclovir (ZOVIRAX) 400 MG tablet        Updated Health Maintenance information Reviewed recent lab results with patient Encouraged  improvement to lifestyle with diet and exercise Goal of weight loss   Gross Hematuria Single episode small Blood in urine noted without symptoms of UTI. Urine culture showed no growth. No current symptoms. - Monitor for recurrence of hematuria. If it recurs, refer to urology.  Medication Refills Patient reported running low on several medications. - Refill Atorvastatin, Lisinopril, Diuretic, Dicyclomine, and Aciclovir.  General Health Maintenance Up to date on vaccinations and screenings. -  Order routine labs today. - Schedule next appointment for early morning, one week prior to visit.  Follow-up         Orders Placed This Encounter  Procedures   Hemoglobin A1c   Lipid panel    Has the patient fasted?:   Yes   CBC with Differential/Platelet   COMPLETE METABOLIC PANEL WITH GFR   TSH    Meds ordered this encounter  Medications   lisinopril (ZESTRIL) 40 MG tablet    Sig: Take 1 tablet (40 mg total) by mouth daily.    Dispense:  90 tablet    Refill:  3   atorvastatin (LIPITOR) 40 MG tablet    Sig: Take 1 tablet (40 mg total) by mouth daily.    Dispense:  90 tablet    Refill:  3   furosemide (LASIX) 20 MG tablet    Sig: Take 1 tablet (20 mg total) by mouth every other day.    Dispense:  45 tablet    Refill:  3   dicyclomine (BENTYL) 10 MG capsule    Sig: Take 1 capsule (10 mg total) by mouth 2 (two) times daily as needed for spasms (abdominal pain cramp).    Dispense:  180 capsule    Refill:  3   acyclovir (ZOVIRAX) 400 MG tablet    Sig: TAKE 1 TABLET BY MOUTH 3 TIMES DAILY FOR 5 TO 10 DAYS OR UNTIL HEALED FOR FEVER BLISTER.    Dispense:  90 tablet    Refill:  3     Follow up plan: Return for 1 year fasting lab > 1 week later Annual Physical.  Future labs 06/13/24  Joanna Pilar, DO [facility] 06/12/2023, 8:13 AM

## 2023-06-12 NOTE — Assessment & Plan Note (Signed)
Controlled HTN repeat manual improved - Home BP readings stable  Complicated by chronic Atrial Fibrillation, MVR, Pulm HTN    Plan:  1. Continue current BP regimen Lisinopril 40mg  daily, Metoprolol XL 150mg  daily 2. Encourage improved lifestyle - low sodium diet, regular exercise 3. Continue monitor BP outside office, bring readings to next visit, if persistently >140/90 or new symptoms notify office sooner

## 2023-06-12 NOTE — Assessment & Plan Note (Signed)
Controlled cholesterol on statin and lifestyle Pending lab. Last lipid panel 05/2022  Plan: 1. Continue current meds - Atorvastatin 40mg  2. Encourage improved lifestyle - low carb/cholesterol, reduce portion size, continue improving regular exercise

## 2023-06-12 NOTE — Assessment & Plan Note (Addendum)
Stable No active goiter or nodule Pending Thyroid panel normal Off Levothyroxine

## 2023-06-13 LAB — TSH: TSH: 0.52 m[IU]/L (ref 0.40–4.50)

## 2023-06-13 LAB — CBC WITH DIFFERENTIAL/PLATELET
Absolute Lymphocytes: 1092 {cells}/uL (ref 850–3900)
Absolute Monocytes: 392 {cells}/uL (ref 200–950)
Basophils Absolute: 39 {cells}/uL (ref 0–200)
Basophils Relative: 0.7 %
Eosinophils Absolute: 73 {cells}/uL (ref 15–500)
Eosinophils Relative: 1.3 %
HCT: 36.4 % (ref 35.0–45.0)
Hemoglobin: 11.5 g/dL — ABNORMAL LOW (ref 11.7–15.5)
MCH: 28.5 pg (ref 27.0–33.0)
MCHC: 31.6 g/dL — ABNORMAL LOW (ref 32.0–36.0)
MCV: 90.1 fL (ref 80.0–100.0)
MPV: 13.2 fL — ABNORMAL HIGH (ref 7.5–12.5)
Monocytes Relative: 7 %
Neutro Abs: 4004 {cells}/uL (ref 1500–7800)
Neutrophils Relative %: 71.5 %
Platelets: 203 10*3/uL (ref 140–400)
RBC: 4.04 10*6/uL (ref 3.80–5.10)
RDW: 13.6 % (ref 11.0–15.0)
Total Lymphocyte: 19.5 %
WBC: 5.6 10*3/uL (ref 3.8–10.8)

## 2023-06-13 LAB — COMPLETE METABOLIC PANEL WITH GFR
AG Ratio: 1 (calc) (ref 1.0–2.5)
ALT: 32 U/L — ABNORMAL HIGH (ref 6–29)
AST: 32 U/L (ref 10–35)
Albumin: 3.3 g/dL — ABNORMAL LOW (ref 3.6–5.1)
Alkaline phosphatase (APISO): 168 U/L — ABNORMAL HIGH (ref 37–153)
BUN: 10 mg/dL (ref 7–25)
CO2: 30 mmol/L (ref 20–32)
Calcium: 8.8 mg/dL (ref 8.6–10.4)
Chloride: 104 mmol/L (ref 98–110)
Creat: 0.78 mg/dL (ref 0.50–1.05)
Globulin: 3.3 g/dL (ref 1.9–3.7)
Glucose, Bld: 96 mg/dL (ref 65–99)
Potassium: 5 mmol/L (ref 3.5–5.3)
Sodium: 141 mmol/L (ref 135–146)
Total Bilirubin: 1.3 mg/dL — ABNORMAL HIGH (ref 0.2–1.2)
Total Protein: 6.6 g/dL (ref 6.1–8.1)
eGFR: 82 mL/min/{1.73_m2} (ref >=60)

## 2023-06-13 LAB — HEMOGLOBIN A1C
Hgb A1c MFr Bld: 5.6 %{Hb} (ref <5.7)
Mean Plasma Glucose: 114 mg/dL
eAG (mmol/L): 6.3 mmol/L

## 2023-06-13 LAB — LIPID PANEL
Cholesterol: 94 mg/dL (ref <200)
HDL: 30 mg/dL — ABNORMAL LOW (ref >=50)
LDL Cholesterol (Calc): 49 mg/dL
Non-HDL Cholesterol (Calc): 64 mg/dL (ref <130)
Total CHOL/HDL Ratio: 3.1 (calc) (ref <5.0)
Triglycerides: 73 mg/dL (ref <150)

## 2023-06-26 DIAGNOSIS — H04123 Dry eye syndrome of bilateral lacrimal glands: Secondary | ICD-10-CM | POA: Diagnosis not present

## 2023-07-08 ENCOUNTER — Ambulatory Visit: Payer: Medicare HMO | Attending: Cardiology

## 2023-07-08 ENCOUNTER — Other Ambulatory Visit: Payer: Self-pay | Admitting: Cardiology

## 2023-07-08 DIAGNOSIS — I482 Chronic atrial fibrillation, unspecified: Secondary | ICD-10-CM

## 2023-07-08 DIAGNOSIS — I059 Rheumatic mitral valve disease, unspecified: Secondary | ICD-10-CM

## 2023-07-08 DIAGNOSIS — Z5181 Encounter for therapeutic drug level monitoring: Secondary | ICD-10-CM | POA: Diagnosis not present

## 2023-07-08 DIAGNOSIS — I4891 Unspecified atrial fibrillation: Secondary | ICD-10-CM | POA: Diagnosis not present

## 2023-07-08 DIAGNOSIS — Z9889 Other specified postprocedural states: Secondary | ICD-10-CM

## 2023-07-08 LAB — POCT INR: INR: 1.8 — AB (ref 2.0–3.0)

## 2023-07-08 NOTE — Patient Instructions (Signed)
 Take 2 tablets today only then Continue 1/2 a tablet daily except for 1 tablet on Mondays,  Wednesdays and Fridays. Recheck INR in 4 weeks

## 2023-07-27 DIAGNOSIS — J101 Influenza due to other identified influenza virus with other respiratory manifestations: Secondary | ICD-10-CM | POA: Diagnosis not present

## 2023-07-27 DIAGNOSIS — Z03818 Encounter for observation for suspected exposure to other biological agents ruled out: Secondary | ICD-10-CM | POA: Diagnosis not present

## 2023-07-31 NOTE — Progress Notes (Signed)
 Cardiology Office Note:  .   Date:  08/03/2023  ID:  [name], DOB 04/27/1956, MRN [id number] PCP: Raina Bunting, [name]  [facility] Providers Cardiologist:  Alexandria Angel, MD  }   History of Present Illness: .   [name] is a 68 y.o. female with history of mitral valve replacement for rheumatic heart disease. Patient had mitral valve replacement in 1997; permanent atrial fibrillation,CHADS VASC Score 3 (Age, HTN, Gender) on coumadin , and HTN,  Cardiac catheterization in 2008 showed normal coronary arteries; HL  Has been started on Abrysvo injections for osteoporosis   She comes today without any cardiac complaints.  She remains active, denies chest pain palpitations bleeding issues on Coumadin .  ROS: As above otherwise negative.    Studies Reviewed: .    Echocardiogram 11/12/2017 Left ventricle: The cavity size was normal. Wall thickness was    normal. Systolic function was normal. The estimated ejection    fraction was in the range of 55% to 60%. Wall motion was normal;    there were no regional wall motion abnormalities. Doppler    parameters are consistent with high ventricular filling pressure.  - Aortic valve: There was trivial regurgitation.  - Mitral valve: A mechanical prosthesis was present. Valve area by    continuity equation (using LVOT flow): 1.71 cm^2.  - Left atrium: The atrium was severely dilated.  - Right ventricle: The cavity size was mildly dilated.  - Pulmonary arteries: Systolic pressure was mildly to moderately    increased.    EKG Interpretation Date/Time:  Monday August 03 2023 09:10:37 EST Ventricular Rate:  74 PR Interval:    QRS Duration:  70 QT Interval:  428 QTC Calculation: 475 R Axis:   93  Text Interpretation: Atrial fibrillation Rightward axis Nonspecific ST abnormality When compared with ECG of 23-Jan-2017 14:42, Vent. rate has decreased BY 111 BPM ST elevation now present in Inferior leads ST no longer  depressed in Anterolateral leads T wave inversion no longer evident in Inferior leads T wave inversion no longer evident in Anterolateral leads Confirmed by Friddie Jetty [phone]) on 08/03/2023 9:35:42 AM    Physical Exam:   VS:  BP 134/86 (BP Location: Left Arm, Patient Position: Sitting, Cuff Size: Normal)   Pulse 74   Ht 5\' 4"  (1.626 m)   Wt 176 lb 3.2 oz (79.9 kg)   SpO2 100%   BMI 30.24 kg/m    Wt Readings from Last 3 Encounters:  08/03/23 176 lb 3.2 oz (79.9 kg)  06/12/23 173 lb (78.5 kg)  05/31/23 157 lb (71.2 kg)    GEN: Well nourished, well developed in no acute distress NECK: No JVD; No carotid bruits CARDIAC: IRRR, crisp click noted at the left sternal border and apex, no murmurs, rubs, gallops RESPIRATORY:  Clear to auscultation without rales, wheezing or rhonchi  ABDOMEN: Soft, non-tender, non-distended EXTREMITIES:  No edema; No deformity   ASSESSMENT AND PLAN: .    1.  Status post mitral valve replacement: Will repeat echocardiogram for ongoing surveillance.  She remains on Coumadin  therapy without any evidence of bleeding or excessive bruising.  She is followed in our [facility] office Coumadin  clinic.  2.  Permanent atrial fibrillation: Heart rate is well-controlled currently.  Remains on Coumadin  as above.  Will continue metoprolol  succinate 1-1/2 tablets daily.  3.  Hypertension: Excellent control of blood pressure.  Continue lisinopril  40 mg daily.  Most recent labs revealed a creatinine of 0.78.  Potassium of  5.  4.  Hyperlipidemia: Remains on atorvastatin  40 mg daily goal of LDL less than 70.  Most recent labs December 2024 total cholesterol 94 LDL 49 HDL 30.         Signed, Conard Decent. Vallery Gavel, ANP, AACC

## 2023-08-03 ENCOUNTER — Ambulatory Visit: Payer: Medicare HMO

## 2023-08-03 ENCOUNTER — Ambulatory Visit: Payer: Medicare HMO | Attending: Adult Health | Admitting: Adult Health

## 2023-08-03 ENCOUNTER — Encounter: Payer: Self-pay | Admitting: Adult Health

## 2023-08-03 VITALS — BP 134/86 | HR 74 | Ht 64.0 in | Wt 176.2 lb

## 2023-08-03 DIAGNOSIS — E785 Hyperlipidemia, unspecified: Secondary | ICD-10-CM | POA: Diagnosis not present

## 2023-08-03 DIAGNOSIS — I4891 Unspecified atrial fibrillation: Secondary | ICD-10-CM | POA: Diagnosis not present

## 2023-08-03 DIAGNOSIS — Z9889 Other specified postprocedural states: Secondary | ICD-10-CM | POA: Diagnosis not present

## 2023-08-03 DIAGNOSIS — I482 Chronic atrial fibrillation, unspecified: Secondary | ICD-10-CM

## 2023-08-03 DIAGNOSIS — Z5181 Encounter for therapeutic drug level monitoring: Secondary | ICD-10-CM | POA: Diagnosis not present

## 2023-08-03 DIAGNOSIS — I059 Rheumatic mitral valve disease, unspecified: Secondary | ICD-10-CM | POA: Diagnosis not present

## 2023-08-03 DIAGNOSIS — Z952 Presence of prosthetic heart valve: Secondary | ICD-10-CM | POA: Diagnosis not present

## 2023-08-03 DIAGNOSIS — I10 Essential (primary) hypertension: Secondary | ICD-10-CM | POA: Diagnosis not present

## 2023-08-03 LAB — POCT INR: INR: 2.2 (ref 2.0–3.0)

## 2023-08-03 NOTE — Patient Instructions (Signed)
 Take 2 tablets today only then Continue 1/2 a tablet daily except for 1 tablet on Mondays,  Wednesdays and Fridays. Recheck INR in 4 weeks

## 2023-08-03 NOTE — Patient Instructions (Signed)
 Medication Instructions:  No Changes *If you need a refill on your cardiac medications before your next appointment, please call your pharmacy*   Lab Work: No labs If you have labs (blood work) drawn today and your tests are completely normal, you will receive your results only by: MyChart Message (if you have MyChart) OR A paper copy in the mail If you have any lab test that is abnormal or we need to change your treatment, we will call you to review the results.   Testing/Procedures: No Testing   Follow-Up: At [facility], you and your health needs are our priority.  As part of our continuing mission to provide you with exceptional heart care, we have created designated Provider Care Teams.  These Care Teams include your primary Cardiologist (physician) and Advanced Practice Providers (APPs -  Physician Assistants and Nurse Practitioners) who all work together to provide you with the care you need, when you need it.  We recommend signing up for the patient portal called "MyChart".  Sign up information is provided on this After Visit Summary.  MyChart is used to connect with patients for Virtual Visits (Telemedicine).  Patients are able to view lab/test results, encounter notes, upcoming appointments, etc.  Non-urgent messages can be sent to your provider as well.   To learn more about what you can do with MyChart, go to [email or web address].    Your next appointment:   6 month(s)  Provider:   Alexandria Angel, MD

## 2023-08-20 ENCOUNTER — Ambulatory Visit: Payer: Medicare HMO | Attending: Adult Health

## 2023-08-20 DIAGNOSIS — Z9889 Other specified postprocedural states: Secondary | ICD-10-CM | POA: Diagnosis not present

## 2023-08-20 DIAGNOSIS — Z952 Presence of prosthetic heart valve: Secondary | ICD-10-CM

## 2023-08-20 LAB — ECHOCARDIOGRAM COMPLETE
AR max vel: 1.91 cm2
AV Area VTI: 1.82 cm2
AV Area mean vel: 1.97 cm2
AV Mean grad: 3 mmHg
AV Peak grad: 6.2 mmHg
Ao pk vel: 1.25 m/s
Calc EF: 52.9 %
MV VTI: 1.98 cm2
S' Lateral: 4.1 cm
Single Plane A2C EF: 50.3 %
Single Plane A4C EF: 53.9 %

## 2023-08-21 ENCOUNTER — Ambulatory Visit: Payer: Medicare HMO | Admitting: "Endocrinology

## 2023-08-22 ENCOUNTER — Other Ambulatory Visit: Payer: Self-pay | Admitting: Family Medicine

## 2023-08-22 DIAGNOSIS — G8929 Other chronic pain: Secondary | ICD-10-CM

## 2023-08-23 ENCOUNTER — Other Ambulatory Visit: Payer: Self-pay | Admitting: Family Medicine

## 2023-08-23 DIAGNOSIS — J3089 Other allergic rhinitis: Secondary | ICD-10-CM

## 2023-08-24 NOTE — Telephone Encounter (Signed)
 Requested medication (s) are due for refill today- expired Rx  Requested medication (s) are on the active medication list -yes  Future visit scheduled -yes  Last refill: 06/10/22 #90 3RF  Notes to clinic: non delegated Rx  Requested Prescriptions  Pending Prescriptions Disp Refills   methocarbamol (ROBAXIN) 500 MG tablet [Pharmacy Med Name: Methocarbamol Oral Tablet 500 MG] 90 tablet 3    Sig: TAKE 1 TABLET FOUR TIMES DAILY AS NEEDED FOR MUSCLE SPASMS (PAIN)     Not Delegated - Analgesics:  Muscle Relaxants Failed - 08/24/2023  1:33 PM      Failed - This refill cannot be delegated      Passed - Valid encounter within last 6 months    Recent Outpatient Visits           2 months ago Annual physical exam   [place] [facility] Smitty Cords, DO   1 year ago Annual physical exam   [place] [facility] Smitty Cords, DO   2 years ago Annual physical exam   [place] [facility] [place], Netta Neat, DO   2 years ago [employer], [employer]   [place] [facility] Smitty Cords, DO   3 years ago Annual physical exam   [place] [facility] Althea Charon, Netta Neat, DO       Future Appointments             In 10 months Althea Charon, Netta Neat, DO [facility], [facility]               Requested Prescriptions  Pending Prescriptions Disp Refills   methocarbamol (ROBAXIN) 500 MG tablet [Pharmacy Med Name: Methocarbamol Oral Tablet 500 MG] 90 tablet 3    Sig: TAKE 1 TABLET FOUR TIMES DAILY AS NEEDED FOR MUSCLE SPASMS (PAIN)     Not Delegated - Analgesics:  Muscle Relaxants Failed - 08/24/2023  1:33 PM      Failed - This refill cannot be delegated      Passed - Valid encounter within last 6 months    Recent Outpatient Visits           2 months ago Annual physical exam   [place] [facility],  Netta Neat, DO   1 year ago Annual physical exam   [place] [facility] Smitty Cords, DO   2 years ago Annual physical exam   [place] [facility] [place], Netta Neat, DO   2 years ago [employer]   [place] [facility] Smitty Cords, DO   3 years ago Annual physical exam   [place] [facility] Smitty Cords, DO       Future Appointments             In 10 months Althea Charon, Netta Neat, DO  [facility], [facility]

## 2023-08-25 NOTE — Telephone Encounter (Signed)
 Requested Prescriptions  Pending Prescriptions Disp Refills   fluticasone (FLONASE) 50 MCG/ACT nasal spray [Pharmacy Med Name: Fluticasone Propionate Nasal Suspension 50 MCG/ACT] 48 g 0    Sig: USE 2 SPRAYS IN EACH NOSTRIL EVERY DAY     Ear, Nose, and Throat: Nasal Preparations - Corticosteroids Passed - 08/25/2023  8:45 AM      Passed - Valid encounter within last 12 months    Recent Outpatient Visits           2 months ago Annual physical exam   [place] [facility] Althea Charon, Netta Neat, DO   1 year ago Annual physical exam   [place] [facility] Smitty Cords, DO   2 years ago Annual physical exam   [place] [facility] [place], Netta Neat, DO   2 years ago [employer]   [place] [facility] Smitty Cords, DO   3 years ago Annual physical exam   [place] [facility] Smitty Cords, DO       Future Appointments             In 10 months Althea Charon, Netta Neat, DO [place] [facility], [facility]

## 2023-08-26 ENCOUNTER — Telehealth: Payer: Self-pay

## 2023-08-26 NOTE — Telephone Encounter (Addendum)
 Results viewed by patient via Mychart.----- Message from Joylene Grapes sent at 08/24/2023 11:03 AM EST ----- Covering Kathryn's inbox. Recent echocardiogram overall normal heart pumping function, the left atrium (top chamber of the heart) is dilated, this is common in the setting of atrial fibrillation.  Mechanical mitral valve was stable, there was mild leakiness of the aortic valve, overall a good report, no significant abnormalities.  Continue current medications and follow-up as planned. Thank you-EM

## 2023-09-02 ENCOUNTER — Ambulatory Visit: Payer: Medicare HMO | Attending: Cardiology

## 2023-09-02 DIAGNOSIS — Z5181 Encounter for therapeutic drug level monitoring: Secondary | ICD-10-CM | POA: Diagnosis not present

## 2023-09-02 DIAGNOSIS — I059 Rheumatic mitral valve disease, unspecified: Secondary | ICD-10-CM

## 2023-09-02 DIAGNOSIS — Z9889 Other specified postprocedural states: Secondary | ICD-10-CM

## 2023-09-02 DIAGNOSIS — I4891 Unspecified atrial fibrillation: Secondary | ICD-10-CM

## 2023-09-02 DIAGNOSIS — I482 Chronic atrial fibrillation, unspecified: Secondary | ICD-10-CM

## 2023-09-02 LAB — POCT INR: INR: 3.1 — AB (ref 2.0–3.0)

## 2023-09-02 NOTE — Patient Instructions (Signed)
 Continue 1/2 a tablet daily except for 1 tablet on Mondays,  Wednesdays and Fridays. Recheck INR in 4 weeks  [phone]

## 2023-09-10 ENCOUNTER — Other Ambulatory Visit: Payer: Self-pay | Admitting: Family Medicine

## 2023-09-10 DIAGNOSIS — B001 Herpesviral vesicular dermatitis: Secondary | ICD-10-CM

## 2023-09-11 NOTE — Telephone Encounter (Signed)
 Requested Prescriptions  Pending Prescriptions Disp Refills   acyclovir (ZOVIRAX) 400 MG tablet [Pharmacy Med Name: Acyclovir Oral Tablet 400 MG] 90 tablet 11    Sig: TAKE 1 TABLET BY MOUTH 3 TIMES DAILY FOR 5 TO 10 DAYS OR UNTIL HEALED FOR FEVER BLISTER.     Antimicrobials:  Antiviral Agents - Anti-Herpetic Passed - 09/11/2023  8:59 AM      Passed - Valid encounter within last 12 months    Recent Outpatient Visits           3 months ago Annual physical exam   [place] [facility] Althea Charon, Netta Neat, DO   1 year ago Annual physical exam   [place] [facility] Smitty Cords, DO   2 years ago Annual physical exam   [place] [facility] Smitty Cords, DO   2 years ago [employer]   [place] [facility] Smitty Cords, DO   3 years ago Annual physical exam   [place] [facility] Smitty Cords, DO       Future Appointments             In 9 months Althea Charon, Netta Neat, DO [place] [facility], [place] [facility]

## 2023-09-30 ENCOUNTER — Ambulatory Visit: Attending: Cardiology

## 2023-09-30 DIAGNOSIS — I059 Rheumatic mitral valve disease, unspecified: Secondary | ICD-10-CM | POA: Diagnosis not present

## 2023-09-30 DIAGNOSIS — Z9889 Other specified postprocedural states: Secondary | ICD-10-CM | POA: Diagnosis not present

## 2023-09-30 DIAGNOSIS — Z5181 Encounter for therapeutic drug level monitoring: Secondary | ICD-10-CM

## 2023-09-30 DIAGNOSIS — I482 Chronic atrial fibrillation, unspecified: Secondary | ICD-10-CM

## 2023-09-30 DIAGNOSIS — I4891 Unspecified atrial fibrillation: Secondary | ICD-10-CM | POA: Diagnosis not present

## 2023-09-30 LAB — POCT INR: INR: 4.6 — AB (ref 2.0–3.0)

## 2023-09-30 NOTE — Patient Instructions (Signed)
 HOLD Tonight only then Continue 1/2 a tablet daily except for 1 tablet on Mondays,  Wednesdays and Fridays. Recheck INR in 3 weeks  [phone]

## 2023-10-21 ENCOUNTER — Ambulatory Visit: Attending: Cardiology

## 2023-10-21 DIAGNOSIS — I482 Chronic atrial fibrillation, unspecified: Secondary | ICD-10-CM | POA: Diagnosis not present

## 2023-10-21 DIAGNOSIS — I4891 Unspecified atrial fibrillation: Secondary | ICD-10-CM

## 2023-10-21 DIAGNOSIS — Z5181 Encounter for therapeutic drug level monitoring: Secondary | ICD-10-CM | POA: Diagnosis not present

## 2023-10-21 DIAGNOSIS — Z9889 Other specified postprocedural states: Secondary | ICD-10-CM | POA: Diagnosis not present

## 2023-10-21 DIAGNOSIS — I059 Rheumatic mitral valve disease, unspecified: Secondary | ICD-10-CM

## 2023-10-21 LAB — POCT INR: INR: 2.7 (ref 2.0–3.0)

## 2023-10-21 NOTE — Patient Instructions (Signed)
 Continue 1/2 a tablet daily except for 1 tablet on Mondays,  Wednesdays and Fridays. Recheck INR in 4 weeks  [phone]

## 2023-10-30 DIAGNOSIS — H04123 Dry eye syndrome of bilateral lacrimal glands: Secondary | ICD-10-CM | POA: Diagnosis not present

## 2023-11-05 ENCOUNTER — Other Ambulatory Visit: Payer: Self-pay | Admitting: Family Medicine

## 2023-11-05 DIAGNOSIS — J3089 Other allergic rhinitis: Secondary | ICD-10-CM

## 2023-11-06 NOTE — Telephone Encounter (Signed)
 OV 06/12/23 Requested Prescriptions  Pending Prescriptions Disp Refills   fluticasone  (FLONASE ) 50 MCG/ACT nasal spray [Pharmacy Med Name: Fluticasone  Propionate Nasal Suspension 50 MCG/ACT] 48 g 3    Sig: USE 2 SPRAYS IN EACH NOSTRIL EVERY DAY     Ear, Nose, and Throat: Nasal Preparations - Corticosteroids Failed - 11/06/2023 12:56 PM      Failed - Valid encounter within last 12 months    Recent Outpatient Visits   None     Future Appointments             In 7 months Karamalegos, Kayleen Party, DO [place] [facility], [facility]

## 2023-11-18 ENCOUNTER — Ambulatory Visit: Attending: Cardiology

## 2023-11-18 DIAGNOSIS — Z9889 Other specified postprocedural states: Secondary | ICD-10-CM

## 2023-11-18 DIAGNOSIS — I059 Rheumatic mitral valve disease, unspecified: Secondary | ICD-10-CM | POA: Diagnosis not present

## 2023-11-18 DIAGNOSIS — I482 Chronic atrial fibrillation, unspecified: Secondary | ICD-10-CM

## 2023-11-18 DIAGNOSIS — I4891 Unspecified atrial fibrillation: Secondary | ICD-10-CM

## 2023-11-18 DIAGNOSIS — Z5181 Encounter for therapeutic drug level monitoring: Secondary | ICD-10-CM | POA: Diagnosis not present

## 2023-11-18 LAB — POCT INR: INR: 4.2 — AB (ref 2.0–3.0)

## 2023-11-18 NOTE — Patient Instructions (Signed)
 Hold tonight only then Continue 1/2 a tablet daily except for 1 tablet on Mondays, Wednesdays and Fridays. Recheck INR in 2 weeks  [phone]

## 2023-12-02 ENCOUNTER — Telehealth: Payer: Self-pay | Admitting: Cardiology

## 2023-12-02 ENCOUNTER — Ambulatory Visit: Attending: Cardiology

## 2023-12-02 DIAGNOSIS — Z9889 Other specified postprocedural states: Secondary | ICD-10-CM | POA: Diagnosis not present

## 2023-12-02 DIAGNOSIS — I059 Rheumatic mitral valve disease, unspecified: Secondary | ICD-10-CM

## 2023-12-02 DIAGNOSIS — I4891 Unspecified atrial fibrillation: Secondary | ICD-10-CM

## 2023-12-02 DIAGNOSIS — I482 Chronic atrial fibrillation, unspecified: Secondary | ICD-10-CM | POA: Diagnosis not present

## 2023-12-02 DIAGNOSIS — Z5181 Encounter for therapeutic drug level monitoring: Secondary | ICD-10-CM | POA: Diagnosis not present

## 2023-12-02 LAB — POCT INR: INR: 2.8 (ref 2.0–3.0)

## 2023-12-02 NOTE — Telephone Encounter (Signed)
 Patient states over the past week her left foot has been swelling. She reports swelling improves with elevation. She is currently taking Lasix  20 mg every other day and will take a dose today.  She denies any redness, warmth or pain to left foot/leg. Denies increased intake of salt. She does report in the past she injured her left ankle.  Encouraged her to continue to elevate foot when siting, also to get up and walk around every 1-2 hours or do foot pumps/circles to help circulation of blood and fluid. If swelling continues or worsens please contact PCP for recommendations. Patient verbalized understanding and expressed appreciation for call.

## 2023-12-02 NOTE — Patient Instructions (Signed)
 Continue 1/2 a tablet daily except for 1 tablet on Mondays, Wednesdays and Fridays. Recheck INR in 3 weeks  [phone]

## 2023-12-02 NOTE — Telephone Encounter (Signed)
  Pt is calling back she said, as of 4:30 PM her BP 153/85

## 2023-12-02 NOTE — Telephone Encounter (Signed)
 Patient reports she checked her BP around 1:30-2:00 PM today, reading was 135/100.  Patient denies any dizziness, SOB, chest pain, blurred vision, or headache.  Patient states she can feel her heart rate beating fast. She rechecked BP while on phone: 167/111.  Patient took Toprol  XL this morning, 150 mg. She has not taken lisinopril  yet. Recommended patient take lisinopril  now and drink an extra bottle of water (has drank only 24 oz of water today) and call with update on BP around 4:30 PM today. Advised on ED precautions if symptoms develop. Patient verbalized understanding.

## 2023-12-02 NOTE — Telephone Encounter (Signed)
 Pt c/o BP issue: STAT if pt c/o blurred vision, one-sided weakness or slurred speech.  STAT if BP is GREATER than 180/120 TODAY.  STAT if BP is LESS than 90/60 and SYMPTOMATIC TODAY  1. What is your BP concern? ^BP  2. Have you taken any BP medication today? not yet, takes at night  3. What are your last 5 BP readings?135/100  4. Are you having any other symptoms (ex. Dizziness, headache, blurred vision, passed out)? No

## 2023-12-02 NOTE — Telephone Encounter (Signed)
 Pt c/o swelling/edema: STAT if pt has developed SOB within 24 hours  If swelling, where is the swelling located? Left foot  How much weight have you gained and in what time span? none  Have you gained 2 pounds in a day or 5 pounds in a week? no  Do you have a log of your daily weights (if so, list)? no  Are you currently taking a fluid pill? yes  Are you currently SOB? no  Have you traveled recently in a car or plane for an extended period of time? no

## 2023-12-03 NOTE — Telephone Encounter (Signed)
 Spoke with pt, Aware of dr Lourdes Roy recommendations. She will continue to track her blood pressure and let us  know how it is running. She also reports her heart rate will elevate while resting. She is going to track that as well and report back next week.

## 2023-12-07 MED ORDER — AMLODIPINE BESYLATE 5 MG PO TABS: 5.0000 mg | ORAL_TABLET | Freq: Every day | ORAL | 3 refills | Status: DC

## 2023-12-07 NOTE — Telephone Encounter (Signed)
 Called patient back to discuss Dr. Lourdes Roy recommendation for elevated BP readings. Dr. Audery Blazing advises to add amlodipine 5 mg daily and follow blood pressure. Patient verbalizes understanding and agrees to submit bp readings in one week.

## 2023-12-07 NOTE — Telephone Encounter (Signed)
 All readings 1 hour after BP medication  6/12: 140/107 HR 114 6/13:138/103 HR 105 6/14:133/81 HR 64 6/15:126/98 HR 83 6/16:132/79 HR 62

## 2023-12-07 NOTE — Telephone Encounter (Signed)
 Patient is calling stating she is returning a call where she could not hear anyone due to being in a dead zone when answering. Please advise.

## 2023-12-07 NOTE — Addendum Note (Signed)
 Addended by: Cherylyn Cos on: 12/07/2023 10:28 AM   Modules accepted: Orders

## 2023-12-07 NOTE — Telephone Encounter (Signed)
 Pt states she was told to call and report her blood pressure readings for the weekend.   6/12: 140/107 6/13:138/103 6/14:133/81 6/15:126/98 6/16:132/79

## 2023-12-12 ENCOUNTER — Other Ambulatory Visit: Payer: Self-pay | Admitting: Family Medicine

## 2023-12-12 DIAGNOSIS — B001 Herpesviral vesicular dermatitis: Secondary | ICD-10-CM

## 2023-12-14 NOTE — Telephone Encounter (Signed)
 Pt calling with BP readings:  6/17-127/71 hr 81 6/18-97/82 hr 53 6/19-111/64 hr 52 6/20-114/64 hr 49 6/21-120/71 hr 71 6/22-113/82 hr 124 6/23-120/78 hr 87

## 2023-12-15 NOTE — Telephone Encounter (Signed)
 Spoke with pt, Aware of dr Ludwig Clarks recommendations.

## 2023-12-15 NOTE — Telephone Encounter (Signed)
Will forward for dr crenshaw review  

## 2023-12-15 NOTE — Telephone Encounter (Signed)
 LOV 06/12/2023  Requested Prescriptions  Pending Prescriptions Disp Refills   acyclovir  (ZOVIRAX ) 400 MG tablet [Pharmacy Med Name: Acyclovir  Oral Tablet 400 MG] 90 tablet 11    Sig: TAKE 1 TABLET BY MOUTH 3 TIMES DAILY FOR 5 TO 10 DAYS OR UNTIL HEALED FOR FEVER BLISTER.     Antimicrobials:  Antiviral Agents - Anti-Herpetic Failed - 12/15/2023 10:58 AM      Failed - Valid encounter within last 12 months    Recent Outpatient Visits   None     Future Appointments             In 2 months Crenshaw, Redell RAMAN, MD [facility] Dept of [employer] [facility] [employer]   In 6 months Edman, Marsa PARAS, DO [place] [facility], [facility]

## 2023-12-23 ENCOUNTER — Ambulatory Visit: Attending: Cardiology

## 2023-12-23 DIAGNOSIS — Z5181 Encounter for therapeutic drug level monitoring: Secondary | ICD-10-CM

## 2023-12-23 DIAGNOSIS — I059 Rheumatic mitral valve disease, unspecified: Secondary | ICD-10-CM | POA: Diagnosis not present

## 2023-12-23 DIAGNOSIS — I4891 Unspecified atrial fibrillation: Secondary | ICD-10-CM | POA: Diagnosis not present

## 2023-12-23 DIAGNOSIS — I482 Chronic atrial fibrillation, unspecified: Secondary | ICD-10-CM

## 2023-12-23 DIAGNOSIS — Z9889 Other specified postprocedural states: Secondary | ICD-10-CM | POA: Diagnosis not present

## 2023-12-23 LAB — POCT INR: INR: 2.2 (ref 2.0–3.0)

## 2023-12-23 NOTE — Patient Instructions (Signed)
 Take 1.5 tablets today only then Continue 1/2 a tablet daily except for 1 tablet on Mondays, Wednesdays and Fridays. Recheck INR in 4 weeks  [phone]

## 2024-01-08 DIAGNOSIS — S00411A Abrasion of right ear, initial encounter: Secondary | ICD-10-CM | POA: Diagnosis not present

## 2024-01-13 DIAGNOSIS — S51832A Puncture wound without foreign body of left forearm, initial encounter: Secondary | ICD-10-CM | POA: Diagnosis not present

## 2024-01-13 DIAGNOSIS — T1490XA Injury, unspecified, initial encounter: Secondary | ICD-10-CM | POA: Diagnosis not present

## 2024-01-20 ENCOUNTER — Ambulatory Visit: Attending: Cardiology

## 2024-01-20 DIAGNOSIS — I059 Rheumatic mitral valve disease, unspecified: Secondary | ICD-10-CM

## 2024-01-20 DIAGNOSIS — Z5181 Encounter for therapeutic drug level monitoring: Secondary | ICD-10-CM

## 2024-01-20 DIAGNOSIS — Z9889 Other specified postprocedural states: Secondary | ICD-10-CM | POA: Diagnosis not present

## 2024-01-20 DIAGNOSIS — I4891 Unspecified atrial fibrillation: Secondary | ICD-10-CM

## 2024-01-20 DIAGNOSIS — I482 Chronic atrial fibrillation, unspecified: Secondary | ICD-10-CM

## 2024-01-20 LAB — POCT INR: INR: 2.8 (ref 2.0–3.0)

## 2024-01-20 NOTE — Patient Instructions (Signed)
 Continue 1/2 a tablet daily except for 1 tablet on Mondays,  Wednesdays and Fridays. Recheck INR in 4 weeks  [phone]

## 2024-02-04 ENCOUNTER — Ambulatory Visit: Payer: Medicare HMO

## 2024-02-04 VITALS — Ht 64.0 in | Wt 172.0 lb

## 2024-02-04 DIAGNOSIS — Z0000 Encounter for general adult medical examination without abnormal findings: Secondary | ICD-10-CM | POA: Diagnosis not present

## 2024-02-04 NOTE — Progress Notes (Signed)
 Subjective:   [name] is a 68 y.o. who presents for a Medicare Wellness preventive visit.  As a reminder, Annual Wellness Visits don't include a physical exam, and some assessments may be limited, especially if this visit is performed virtually. We may recommend an in-person follow-up visit with your provider if needed.  Visit Complete: Virtual I connected with  [name] on 02/04/24 by a audio enabled telemedicine application and verified that I am speaking with the correct person using two identifiers.  Patient Location: Home  Provider Location: Home Office  I discussed the limitations of evaluation and management by telemedicine. The patient expressed understanding and agreed to proceed.  Vital Signs: Because this visit was a virtual/telehealth visit, some criteria may be missing or patient reported. Any vitals not documented were not able to be obtained and vitals that have been documented are patient reported.    Persons Participating in Visit: Patient.  AWV Questionnaire: No: Patient Medicare AWV questionnaire was not completed prior to this visit.  Cardiac Risk Factors include: advanced age (>62men, >59 women);hypertension     Objective:    Today's Vitals   02/04/24 0813  Weight: 172 lb (78 kg)  Height: 5' 4 (1.626 m)   Body mass index is 29.52 kg/m.     02/04/2024    8:20 AM 05/31/2023   12:50 PM 01/29/2023    8:36 AM 12/27/2021    2:58 PM 09/10/2021    8:17 AM 06/12/2020    9:17 AM 11/07/2019   12:21 PM  Advanced Directives  Does Patient Have a Medical Advance Directive? No No No No No No No  Would patient like information on creating a medical advance directive? No - Patient declined  No - Patient declined No - Patient declined No - Patient declined  Yes (MAU/Ambulatory/Procedural Areas - Information given)    Current Medications (verified) Outpatient Encounter Medications as of 02/04/2024  Medication Sig   ABRYSVO 120 MCG/0.5ML injection   (Patient not taking: Reported on 08/03/2023)   acyclovir  (ZOVIRAX ) 400 MG tablet TAKE 1 TABLET BY MOUTH 3 TIMES DAILY FOR 5 TO 10 DAYS OR UNTIL HEALED FOR FEVER BLISTER.   amLODipine  (NORVASC ) 5 MG tablet Take 1 tablet (5 mg total) by mouth daily.   aspirin  EC 81 MG tablet Take 1 tablet (81 mg total) by mouth daily.   atorvastatin  (LIPITOR) 40 MG tablet Take 1 tablet (40 mg total) by mouth daily.   calcium  carbonate (OSCAL) 1500 (600 Ca) MG TABS tablet Take 1 tablet by mouth daily.    cetirizine  (ZYRTEC ) 10 MG tablet Take 1 tablet (10 mg total) by mouth daily.   Cholecalciferol (VITAMIN D3) 2000 units capsule Take 1 capsule (2,000 Units total) by mouth daily.   denosumab  (PROLIA ) 60 MG/ML SOSY injection Inject 60 mg into the skin every 6 (six) months.   dicyclomine  (BENTYL ) 10 MG capsule Take 1 capsule (10 mg total) by mouth 2 (two) times daily as needed for spasms (abdominal pain cramp).   fluticasone  (FLONASE ) 50 MCG/ACT nasal spray USE 2 SPRAYS IN EACH NOSTRIL EVERY DAY   furosemide  (LASIX ) 20 MG tablet Take 1 tablet (20 mg total) by mouth every other day.   lisinopril  (ZESTRIL ) 40 MG tablet Take 1 tablet (40 mg total) by mouth daily.   methocarbamol  (ROBAXIN ) 500 MG tablet TAKE 1 TABLET FOUR TIMES DAILY AS NEEDED FOR MUSCLE SPASMS (PAIN)   metoprolol  succinate (TOPROL -XL) 100 MG 24 hr tablet TAKE 1 AND 1/2 TABLETS EVERY  DAY WITH OR IMMEDIATELY FOLLOWING A MEAL.   Multiple Vitamins-Minerals (CENTRUM SILVER ADULT 50+ PO) Take 1 tablet by mouth daily.   triamcinolone  cream (KENALOG ) 0.5 % Apply 1 application topically 2 (two) times daily. To affected areas, for up to 2 weeks. (Patient not taking: Reported on 08/03/2023)   warfarin (COUMADIN ) 5 MG tablet TAKE 1/2 TO 1 TABLET DAILY AS DIRECTED BY COUMADIN  CLINIC   Facility-Administered Encounter Medications as of 02/04/2024  Medication   denosumab  (PROLIA ) injection 60 mg    Allergies (verified) Patient has no known allergies.   History: Past  Medical History:  Diagnosis Date   Anticoagulated on warfarin    Arthritis    Heart murmur    History of cardiac catheterization    a. 2008 Cath: nl cors.   History of hyperthyroidism    a. 2003--  PTU [place]   Hypercholesterolemia    Hyperlipidemia    Multinodular goiter    Permanent atrial fibrillation (HCC)    a. Dx 1997; b. CHA2DS2VASc = 3-->warfarin (s/p MVR as well); c. 06/2013 Holter: HRs freq in low 100's to 1-teens w/ max rate 179-->beta blocker increased.   Pulmonary hypertension, mild (HCC)    Rheumatic heart disease    S/P MVR (mitral valve replacement)    a. 1997 s/p SJM mechanical MVR 2/2 Sev MS; b. chronic warfarin; c. 10/2017 Echo: EF 55-60%, no rwma, triv AI. Nl fxn of MV prosthesis. Sev dil LA. Mildly to mod increased PA pressures.   Past Surgical History:  Procedure Laterality Date   ANTERIOR CERVICAL DECOMP/DISCECTOMY FUSION  12-21-2001   C5 --  C6   BREAST BIOPSY Left 11/03/2016   Procedure: BREAST BIOPSY WITH NEEDLE LOCALIZATION;  Surgeon: Dessa Reyes ORN, MD;  Location: [facility] ORS;  Service: General;  Laterality: Left;   BREAST EXCISIONAL BIOPSY Left 2018   CARDIAC CATHETERIZATION  12-28-2006  [facility]   NORMAL CORONARY ARTERIES   CARPAL TUNNEL RELEASE Right 01-20-2007   COLONOSCOPY  2015   COLONOSCOPY WITH PROPOFOL  N/A 09/10/2021   Procedure: COLONOSCOPY WITH PROPOFOL ;  Surgeon: Jinny Carmine, MD;  Location: [facility] ENDOSCOPY;  Service: Endoscopy;  Laterality: N/A;   ERCP  08/15/2011   Procedure: ENDOSCOPIC RETROGRADE CHOLANGIOPANCREATOGRAPHY (ERCP);  Surgeon: Oliva FORBES Boots, MD;  Location: [facility] ENDOSCOPY;  Service: Endoscopy;  Laterality: N/A;   ERCP     MULTIPLE--  INCLUDING STENTS, SPHINTEROTOMY'S  STONE REMOVAL    ESOPHAGOGASTRODUODENOSCOPY (EGD) WITH PROPOFOL   09/10/2021   Procedure: ESOPHAGOGASTRODUODENOSCOPY (EGD) WITH PROPOFOL ;  Surgeon: Jinny Carmine, MD;  Location: [facility] ENDOSCOPY;  Service: Endoscopy;;   FEMUR IM NAIL Right 04/26/2014   Procedure:  INTRAMEDULLARY (IM) NAIL FEMORAL;  Surgeon: Norleen LITTIE Gavel, MD;  Location: [facility] OR;  Service: Orthopedics;  Laterality: Right;   I & D EXTREMITY Right 02/07/2013   Procedure: IRRIGATION AND DEBRIDEMENT OF RIGHT LOWER LEG WITH PLACEMENT OF ACELL AND WOULND VAC;  Surgeon: Estefana Reichert, DO;  Location: [facility];  Service: Plastics;  Laterality: Right;   LAPAROSCOPIC CHOLECYSTECTOMY  04-08-2002   MITRAL VALVE REPLACEMENT  1997   [facility]   ORIF ANKLE FRACTURE Right 11/05/2012   Procedure: OPEN REDUCTION INTERNAL FIXATION (ORIF) ANKLE FRACTURE only operating on right;  Surgeon: Norleen LITTIE Gavel, MD;  Location: [facility] OR;  Service: Orthopedics;  Laterality: Right;   ORIF RIGHT ULNAR FX W/ ILIAC CREST BONE GRAFT  10-06-2008   ORIF WRIST FRACTURE Right 04/26/2014   Procedure: OPEN REDUCTION INTERNAL FIXATION (ORIF) WRIST FRACTURE;  Surgeon: Norleen  LITTIE Gavel, MD;  Location: [facility] OR;  Service: Orthopedics;  Laterality: Right;   TRANSTHORACIC ECHOCARDIOGRAM  06-29-2009  dr wall   normal lvsf/ ef 55-60%/  normal bileaflet mechanical mvr/ moderated dilated la/ moderate tr   WRIST ARTHROSCOPY Right 12-10-2007   debridement and ulnar shortening osteotomy w/ plate   Family History  Problem Relation Age of Onset   Hypothyroidism Sister    Gallbladder disease Sister    Coronary artery disease Other    Diabetes Mother    Hypertension Mother    Heart disease Father    Stroke Father    Breast cancer Neg Hx    Social History   Socioeconomic History   Marital status: Divorced    Spouse name: Not on file   Number of children: 0   Years of education: 12   Highest education level: 12th grade  Occupational History   Occupation: Geologist, engineering    Comment: retired  Tobacco Use   Smoking status: Never   Smokeless tobacco: Never  Vaping Use   Vaping status: Never Used  Substance and Sexual Activity   Alcohol use: No   Drug use: No   Sexual activity: Not on file  Other Topics Concern   Not on file   Social History Narrative   Not on file   Social Drivers of Health   Financial Resource Strain: Low Risk  (02/04/2024)   Overall Financial Resource Strain (CARDIA)    Difficulty of Paying Living Expenses: Not hard at all  Food Insecurity: No Food Insecurity (02/04/2024)   Hunger Vital Sign    Worried About Running Out of Food in the Last Year: Never true    Ran Out of Food in the Last Year: Never true  Transportation Needs: No Transportation Needs (02/04/2024)   PRAPARE - Administrator, Civil Service (Medical): No    Lack of Transportation (Non-Medical): No  Physical Activity: Inactive (02/04/2024)   Exercise Vital Sign    Days of Exercise per Week: 0 days    Minutes of Exercise per Session: 0 min  Stress: No Stress Concern Present (02/04/2024)   [employer] of Occupational Health - Occupational Stress Questionnaire    Feeling of Stress: Not at all  Social Connections: Moderately Integrated (02/04/2024)   Social Connection and Isolation Panel    Frequency of Communication with Friends and Family: More than three times a week    Frequency of Social Gatherings with Friends and Family: More than three times a week    Attends Religious Services: More than 4 times per year    Active Member of [employer] or Organizations: Yes    Attends Engineer, structural: More than 4 times per year    Marital Status: Divorced    Tobacco Counseling Counseling given: Not Answered    Clinical Intake:  Pre-visit preparation completed: Yes  Pain : No/denies pain     BMI - recorded: 29.52 Nutritional Status: BMI 25 -29 Overweight Nutritional Risks: None Diabetes: No  Lab Results  Component Value Date   HGBA1C 5.6 06/12/2023   HGBA1C 5.6 06/03/2022   HGBA1C 5.6 05/30/2021     How often do you need to have someone help you when you read instructions, pamphlets, or other written materials from your doctor or pharmacy?: 1 - Never  Interpreter Needed?: No  Information  entered by :: Rojelio Blush LPN   Activities of Daily Living     02/04/2024    8:18 AM  In your  present state of health, do you have any difficulty performing the following activities:  Hearing? 0  Vision? 0  Difficulty concentrating or making decisions? 0  Walking or climbing stairs? 0  Dressing or bathing? 0  Doing errands, shopping? 0  Preparing Food and eating ? N  Using the Toilet? N  In the past six months, have you accidently leaked urine? N  Do you have problems with loss of bowel control? Y  Comment Wears Pads. Dx: IBS  Managing your Medications? N  Managing your Finances? N  Housekeeping or managing your Housekeeping? N    Patient Care Team: Edman Marsa PARAS, DO as PCP - General (Family Medicine) Pietro Redell RAMAN, MD as PCP - Cardiology (Cardiology) Jinny Carmine, MD as Consulting Physician (Gastroenterology) Pietro Redell RAMAN, MD as Consulting Physician (Cardiology)  I have updated your Care Teams any recent Medical Services you may have received from other providers in the past year.     Assessment:   This is a routine wellness examination for [name].  Hearing/Vision screen Hearing Screening - Comments:: Denies hearing difficulties   Vision Screening - Comments:: Wears rx glasses - up to date with [facility] exams with  [facility]   Goals Addressed               This Visit's Progress     Increase physical activity (pt-stated)        Lose weight and remain active.       Depression Screen     02/04/2024    8:18 AM 06/12/2023    8:05 AM 01/29/2023    8:35 AM 06/10/2022    8:02 AM 12/27/2021    2:56 PM 03/11/2021   10:24 AM 12/05/2020    8:00 AM  PHQ 2/9 Scores  PHQ - 2 Score 0 0 0 0 0 0 0  PHQ- 9 Score   0 0 0  0    Fall Risk     02/04/2024    8:19 AM 01/29/2023    8:37 AM 01/25/2023    8:37 AM 12/29/2022    8:51 AM 06/10/2022    8:02 AM  Fall Risk   Falls in the past year? 0 0 0 0 0  Number falls in past yr: 0 0 0 0 0  Injury  with Fall? 0 0 0 0 0  Risk for fall due to : No Fall Risks No Fall Risks   No Fall Risks  Follow up Falls evaluation completed Falls prevention discussed;Falls evaluation completed   Falls evaluation completed      Data saved with a previous flowsheet row definition    MEDICARE RISK AT HOME:  Medicare Risk at Home Any stairs in or around the home?: Yes If so, are there any without handrails?: No Home free of loose throw rugs in walkways, pet beds, electrical cords, etc?: Yes Adequate lighting in your home to reduce risk of falls?: Yes Life alert?: No Use of a cane, walker or w/c?: No Grab bars in the bathroom?: Yes Shower chair or bench in shower?: Yes Elevated toilet seat or a handicapped toilet?: No  TIMED UP AND GO:  Was the test performed?  No  Cognitive Function: 6CIT completed        02/04/2024    8:21 AM 01/29/2023    8:38 AM 06/12/2020    9:20 AM 06/01/2018    8:33 AM 05/26/2017    8:58 AM  6CIT Screen  What Year? 0 points 0  points 0 points 0 points 0 points  What month? 0 points 0 points 0 points 0 points 0 points  What time? 0 points 0 points 0 points 0 points 0 points  Count back from 20 0 points 0 points 0 points 0 points 0 points  Months in reverse 0 points 0 points 0 points 0 points 0 points  Repeat phrase 0 points 0 points 0 points 0 points 2 points  Total Score 0 points 0 points 0 points 0 points 2 points    Immunizations Immunization History  Administered Date(s) Administered   Fluad Quad(high Dose 65+) 03/23/2022   Influenza, High Dose Seasonal PF 03/02/2019, 03/19/2020, 03/19/2021   Influenza-Unspecified 03/17/2016, 03/30/2018, 03/16/2020, 04/08/2023   PFIZER(Purple Top)SARS-COV-2 Vaccination 08/24/2019, 09/14/2019, 03/20/2020   PNEUMOCOCCAL CONJUGATE-20 06/06/2021   Respiratory Syncytial Virus Vaccine,Recomb Aduvanted(Arexvy) 03/23/2022   Zoster, Live 04/29/2016    Screening Tests Health Maintenance  Topic Date Due   Zoster Vaccines- Shingrix  (1 of 2) 08/15/2003   COVID-19 Vaccine (4 - 2024-25 season) 02/22/2023   INFLUENZA VACCINE  01/22/2024   DEXA SCAN  03/10/2024   Medicare Annual Wellness (AWV)  02/03/2025   MAMMOGRAM  04/30/2025   Colonoscopy  09/10/2028   Pneumococcal Vaccine: 50+ Years  Completed   Hepatitis C Screening  Completed   Hepatitis B Vaccines  Aged Out   HPV VACCINES  Aged Out   Meningococcal B Vaccine  Aged Out   DTaP/Tdap/Td  Discontinued    Health Maintenance  Health Maintenance Due  Topic Date Due   Zoster Vaccines- Shingrix (1 of 2) 08/15/2003   COVID-19 Vaccine (4 - 2024-25 season) 02/22/2023   INFLUENZA VACCINE  01/22/2024   Health Maintenance Items Addressed:   Additional Screening:  Vision Screening: Recommended annual ophthalmology exams for early detection of glaucoma and other disorders of the eye. Would you like a referral to an eye doctor? No    Dental Screening: Recommended annual dental exams for proper oral hygiene  Community Resource Referral / Chronic Care Management: CRR required this visit?  No   CCM required this visit?  No   Plan:    I have personally reviewed and noted the following in the patient's chart:   Medical and social history Use of alcohol, tobacco or illicit drugs  Current medications and supplements including opioid prescriptions. Patient is not currently taking opioid prescriptions. Functional ability and status Nutritional status Physical activity Advanced directives List of other physicians Hospitalizations, surgeries, and ER visits in previous 12 months Vitals Screenings to include cognitive, depression, and falls Referrals and appointments  In addition, I have reviewed and discussed with patient certain preventive protocols, quality metrics, and best practice recommendations. A written personalized care plan for preventive services as well as general preventive health recommendations were provided to patient.   Rojelio LELON Blush,  LPN   [id number]   After Visit Summary: (MyChart) Due to this being a telephonic visit, the after visit summary with patients personalized plan was offered to patient via MyChart   Notes: Nothing significant to report at this time.

## 2024-02-04 NOTE — Patient Instructions (Addendum)
 Ms. Auerbach , Thank you for taking time out of your busy schedule to complete your Annual Wellness Visit with me. I enjoyed our conversation and look forward to speaking with you again next year. I, as well as your care team,  appreciate your ongoing commitment to your health goals. Please review the following plan we discussed and let me know if I can assist you in the future. Your Game plan/ To Do List    Referrals: If you haven't heard from the office you've been referred to, please reach out to them at the phone provided.   Follow up Visits: We will see or speak with you next year for your Next Medicare AWV with our clinical staff 02/17/25 8:10a Have you seen your provider in the last 6 months (3 months if uncontrolled diabetes)? Next appointment with provider 06/20/24 @ 8a  Clinician Recommendations:  Aim for 30 minutes of exercise or brisk walking, 6-8 glasses of water, and 5 servings of fruits and vegetables each day.       This is a list of the screenings recommended for you:  Health Maintenance  Topic Date Due   Zoster (Shingles) Vaccine (1 of 2) 08/15/2003   COVID-19 Vaccine (4 - 2024-25 season) 02/22/2023   Flu Shot  01/22/2024   DEXA scan (bone density measurement)  03/10/2024   Medicare Annual Wellness Visit  02/03/2025   Mammogram  04/30/2025   Colon Cancer Screening  09/10/2028   Pneumococcal Vaccine for age over 23  Completed   Hepatitis C Screening  Completed   Hepatitis B Vaccine  Aged Out   HPV Vaccine  Aged Out   Meningitis B Vaccine  Aged Out   DTaP/Tdap/Td vaccine  Discontinued    Advanced directives: (Provided) Advance directive discussed with you today. I have provided a copy for you to complete at home and have notarized. Once this is complete, please bring a copy in to our office so we can scan it into your chart.  Advance Care Planning is important because it:  [x]  Makes sure you receive the medical care that is consistent with your values, goals, and  preferences  [x]  It provides guidance to your family and loved ones and reduces their decisional burden about whether or not they are making the right decisions based on your wishes.  Follow the link provided in your after visit summary or read over the paperwork we have mailed to you to help you started getting your Advance Directives in place. If you need assistance in completing these, please reach out to us  so that we can help you!  See attachments for Preventive Care and Fall Prevention Tips.

## 2024-02-16 NOTE — Progress Notes (Signed)
 HPI: FU atrial fibrillation and previous mitral valve replacement for rheumatic heart disease. Patient had mitral valve replacement in 1997. Cardiac catheterization in 2008 showed normal coronary arteries. ABIs with Doppler in July of 2014 normal. Holter monitor January 2015 showed heart rate was elevated and beta blocker was increased. Echocardiogram February 2025 showed normal LV function, severe left atrial enlargement, status post mitral valve replacement with mean gradient 5 mmHg and no mitral regurgitation, moderate tricuspid regurgitation, mild aortic insufficiency.  Since last seen, she denies dyspnea, chest pain, palpitations or syncope.  She has had some mild bleeding from a lesion in her lower abdomen which she had cauterized.  She has had no melena or hematochezia and no other bleeding.  Current Outpatient Medications  Medication Sig Dispense Refill   acyclovir  (ZOVIRAX ) 400 MG tablet TAKE 1 TABLET BY MOUTH 3 TIMES DAILY FOR 5 TO 10 DAYS OR UNTIL HEALED FOR FEVER BLISTER. 90 tablet 11   amLODipine  (NORVASC ) 5 MG tablet Take 1 tablet (5 mg total) by mouth daily. 90 tablet 3   aspirin  EC 81 MG tablet Take 1 tablet (81 mg total) by mouth daily. 90 tablet 3   atorvastatin  (LIPITOR) 40 MG tablet Take 1 tablet (40 mg total) by mouth daily. 90 tablet 3   calcium  carbonate (OSCAL) 1500 (600 Ca) MG TABS tablet Take 1 tablet by mouth daily.      cetirizine  (ZYRTEC ) 10 MG tablet Take 1 tablet (10 mg total) by mouth daily. 90 tablet 2   Cholecalciferol (VITAMIN D3) 2000 units capsule Take 1 capsule (2,000 Units total) by mouth daily.     denosumab  (PROLIA ) 60 MG/ML SOSY injection Inject 60 mg into the skin every 6 (six) months. 1 mL 1   dicyclomine  (BENTYL ) 10 MG capsule Take 1 capsule (10 mg total) by mouth 2 (two) times daily as needed for spasms (abdominal pain cramp). 180 capsule 3   fluticasone  (FLONASE ) 50 MCG/ACT nasal spray USE 2 SPRAYS IN EACH NOSTRIL EVERY DAY 48 g 3   furosemide   (LASIX ) 20 MG tablet Take 1 tablet (20 mg total) by mouth every other day. 45 tablet 3   lisinopril  (ZESTRIL ) 40 MG tablet Take 1 tablet (40 mg total) by mouth daily. 90 tablet 3   methocarbamol  (ROBAXIN ) 500 MG tablet TAKE 1 TABLET FOUR TIMES DAILY AS NEEDED FOR MUSCLE SPASMS (PAIN) 90 tablet 3   metoprolol  succinate (TOPROL -XL) 100 MG 24 hr tablet TAKE 1 AND 1/2 TABLETS EVERY DAY WITH OR IMMEDIATELY FOLLOWING A MEAL. 135 tablet 3   Multiple Vitamins-Minerals (CENTRUM SILVER ADULT 50+ PO) Take 1 tablet by mouth daily.     warfarin (COUMADIN ) 5 MG tablet TAKE 1/2 TO 1 TABLET DAILY AS DIRECTED BY COUMADIN  CLINIC 70 tablet 3   ABRYSVO 120 MCG/0.5ML injection  (Patient not taking: Reported on 02/29/2024)     triamcinolone  cream (KENALOG ) 0.5 % Apply 1 application topically 2 (two) times daily. To affected areas, for up to 2 weeks. (Patient not taking: Reported on 02/29/2024) 30 g 0   Current Facility-Administered Medications  Medication Dose Route Frequency Provider Last Rate Last Admin   denosumab  (PROLIA ) injection 60 mg  60 mg Subcutaneous Once Nida, Gebreselassie W, MD         Past Medical History:  Diagnosis Date   Anticoagulated on warfarin    Arthritis    Heart murmur    History of cardiac catheterization    a. 2008 Cath: nl cors.   History of  hyperthyroidism    a. 2003--  PTU [place]   Hypercholesterolemia    Hyperlipidemia    Multinodular goiter    Permanent atrial fibrillation (HCC)    a. Dx 1997; b. CHA2DS2VASc = 3-->warfarin (s/p MVR as well); c. 06/2013 Holter: HRs freq in low 100's to 1-teens w/ max rate 179-->beta blocker increased.   Pulmonary hypertension, mild (HCC)    Rheumatic heart disease    S/P MVR (mitral valve replacement)    a. 1997 s/p SJM mechanical MVR 2/2 Sev MS; b. chronic warfarin; c. 10/2017 Echo: EF 55-60%, no rwma, triv AI. Nl fxn of MV prosthesis. Sev dil LA. Mildly to mod increased PA pressures.    Past Surgical History:  Procedure Laterality Date    ANTERIOR CERVICAL DECOMP/DISCECTOMY FUSION  12-21-2001   C5 --  C6   BREAST BIOPSY Left 11/03/2016   Procedure: BREAST BIOPSY WITH NEEDLE LOCALIZATION;  Surgeon: Dessa Reyes ORN, MD;  Location: [facility] ORS;  Service: General;  Laterality: Left;   BREAST EXCISIONAL BIOPSY Left 2018   CARDIAC CATHETERIZATION  12-28-2006  [facility]   NORMAL CORONARY ARTERIES   CARPAL TUNNEL RELEASE Right 01-20-2007   COLONOSCOPY  2015   COLONOSCOPY WITH PROPOFOL  N/A 09/10/2021   Procedure: COLONOSCOPY WITH PROPOFOL ;  Surgeon: Jinny Carmine, MD;  Location: [facility] END[facility];  Service: Endoscopy;  Laterality: N/A;   ERCP  08/15/2011   Procedure: ENDOSCOPIC RETROGRADE CHOLANGIOPANCREATOGRAPHY (ERCP);  Surgeon: Oliva FORBES Boots, MD;  Location: [facility];  Service: Endoscopy;  Laterality: N/A;   ERCP     MULTIPLE--  INCLUDING STENTS, SPHINTEROTOMY'S  STONE REMOVAL    ESOPHAGOGASTRODUODENOSCOPY (EGD) WITH PROPOFOL   09/10/2021   Procedure: ESOPHAGOGASTRODUODENOSCOPY (EGD) WITH PROPOFOL ;  Surgeon: Jinny Carmine, MD;  Location: [facility] ENDOSCOPY;  Service: Endoscopy;;   FEMUR IM NAIL Right 04/26/2014   Procedure: INTRAMEDULLARY (IM) NAIL FEMORAL;  Surgeon: Norleen LITTIE Gavel, MD;  Location: [facility] OR;  Service: Orthopedics;  Laterality: Right;   I & D EXTREMITY Right 02/07/2013   Procedure: IRRIGATION AND DEBRIDEMENT OF RIGHT LOWER LEG WITH PLACEMENT OF ACELL AND WOULND VAC;  Surgeon: Estefana Reichert, DO;  Location: [facility];  Service: Plastics;  Laterality: Right;   LAPAROSCOPIC CHOLECYSTECTOMY  04-08-2002   MITRAL VALVE REPLACEMENT  1997   [facility]   ORIF ANKLE FRACTURE Right 11/05/2012   Procedure: OPEN REDUCTION INTERNAL FIXATION (ORIF) ANKLE FRACTURE only operating on right;  Surgeon: Norleen LITTIE Gavel, MD;  Location: [facility] OR;  Service: Orthopedics;  Laterality: Right;   ORIF RIGHT ULNAR FX W/ ILIAC CREST BONE GRAFT  10-06-2008   ORIF WRIST FRACTURE Right 04/26/2014   Procedure: OPEN REDUCTION INTERNAL FIXATION (ORIF) WRIST  FRACTURE;  Surgeon: Norleen LITTIE Gavel, MD;  Location: [facility] OR;  Service: Orthopedics;  Laterality: Right;   TRANSTHORACIC ECHOCARDIOGRAM  06-29-2009  dr wall   normal lvsf/ ef 55-60%/  normal bileaflet mechanical mvr/ moderated dilated la/ moderate tr   WRIST ARTHROSCOPY Right 12-10-2007   debridement and ulnar shortening osteotomy w/ plate    Social History   Socioeconomic History   Marital status: Divorced    Spouse name: Not on file   Number of children: 0   Years of education: 12   Highest education level: 12th grade  Occupational History   Occupation: Geologist, engineering    Comment: retired  Tobacco Use   Smoking status: Never   Smokeless tobacco: Never  Vaping Use   Vaping status: Never Used  Substance and Sexual Activity   Alcohol  use: No   Drug use: No   Sexual activity: Not on file  Other Topics Concern   Not on file  Social History Narrative   Not on file   Social Drivers of Health   Financial Resource Strain: Low Risk  (02/04/2024)   Overall Financial Resource Strain (CARDIA)    Difficulty of Paying Living Expenses: Not hard at all  Food Insecurity: No Food Insecurity (02/04/2024)   Hunger Vital Sign    Worried About Running Out of Food in the Last Year: Never true    Ran Out of Food in the Last Year: Never true  Transportation Needs: No Transportation Needs (02/04/2024)   PRAPARE - Administrator, Civil Service (Medical): No    Lack of Transportation (Non-Medical): No  Physical Activity: Inactive (02/04/2024)   Exercise Vital Sign    Days of Exercise per Week: 0 days    Minutes of Exercise per Session: 0 min  Stress: No Stress Concern Present (02/04/2024)   [employer] of Occupational Health - Occupational Stress Questionnaire    Feeling of Stress: Not at all  Social Connections: Moderately Integrated (02/04/2024)   Social Connection and Isolation Panel    Frequency of Communication with Friends and Family: More than three times a week     Frequency of Social Gatherings with Friends and Family: More than three times a week    Attends Religious Services: More than 4 times per year    Active Member of [employer] or Organizations: Yes    Attends Engineer, structural: More than 4 times per year    Marital Status: Divorced  Intimate Partner Violence: Not At Risk (02/04/2024)   Humiliation, Afraid, Rape, and Kick questionnaire    Fear of Current or Ex-Partner: No    Emotionally Abused: No    Physically Abused: No    Sexually Abused: No    Family History  Problem Relation Age of Onset   Hypothyroidism Sister    Gallbladder disease Sister    Coronary artery disease Other    Diabetes Mother    Hypertension Mother    Heart disease Father    Stroke Father    Breast cancer Neg Hx     ROS: no fevers or chills, productive cough, hemoptysis, dysphasia, odynophagia, melena, hematochezia, dysuria, hematuria, rash, seizure activity, orthopnea, PND, pedal edema, claudication. Remaining systems are negative.  Physical Exam: Well-developed well-nourished in no acute distress.  Skin is warm and dry.  HEENT is normal.  Neck is supple.  Chest is clear to auscultation with normal expansion.  Cardiovascular exam is regular rate and rhythm.  Abdominal exam nontender or distended. No masses palpated. Extremities show no edema. neuro grossly intact  ECG- personally reviewed  A/P  1 history of mitral valve replacement-continue SBE prophylaxis.  Continue aspirin  and Coumadin  with goal INR 2.5-3.5.  Check hemoglobin.  2 permanent atrial fibrillation-heart rate controlled.  Will continue beta-blocker at present dose.  Continue Coumadin  as outlined above.  3 hyperlipidemia-continue statin.  Check lipids and liver.  4 hypertension-blood pressure controlled.  Continue present medications.  Check potassium and renal function.  Redell Shallow, MD

## 2024-02-17 ENCOUNTER — Ambulatory Visit: Attending: Cardiology

## 2024-02-17 DIAGNOSIS — I482 Chronic atrial fibrillation, unspecified: Secondary | ICD-10-CM

## 2024-02-17 DIAGNOSIS — I4891 Unspecified atrial fibrillation: Secondary | ICD-10-CM | POA: Diagnosis not present

## 2024-02-17 DIAGNOSIS — I059 Rheumatic mitral valve disease, unspecified: Secondary | ICD-10-CM

## 2024-02-17 DIAGNOSIS — Z5181 Encounter for therapeutic drug level monitoring: Secondary | ICD-10-CM | POA: Diagnosis not present

## 2024-02-17 DIAGNOSIS — Z9889 Other specified postprocedural states: Secondary | ICD-10-CM

## 2024-02-17 LAB — POCT INR: INR: 2.5 (ref 2.0–3.0)

## 2024-02-17 NOTE — Patient Instructions (Signed)
 Continue 1/2 a tablet daily except for 1 tablet on Mondays,  Wednesdays and Fridays. Recheck INR in 4 weeks  [phone]

## 2024-02-24 DIAGNOSIS — H01006 Unspecified blepharitis left eye, unspecified eyelid: Secondary | ICD-10-CM | POA: Diagnosis not present

## 2024-02-24 DIAGNOSIS — H52223 Regular astigmatism, bilateral: Secondary | ICD-10-CM | POA: Diagnosis not present

## 2024-02-24 DIAGNOSIS — H04123 Dry eye syndrome of bilateral lacrimal glands: Secondary | ICD-10-CM | POA: Diagnosis not present

## 2024-02-24 DIAGNOSIS — H524 Presbyopia: Secondary | ICD-10-CM | POA: Diagnosis not present

## 2024-02-24 DIAGNOSIS — D3132 Benign neoplasm of left choroid: Secondary | ICD-10-CM | POA: Diagnosis not present

## 2024-02-24 DIAGNOSIS — H5213 Myopia, bilateral: Secondary | ICD-10-CM | POA: Diagnosis not present

## 2024-02-24 DIAGNOSIS — Z961 Presence of intraocular lens: Secondary | ICD-10-CM | POA: Diagnosis not present

## 2024-02-24 DIAGNOSIS — H01003 Unspecified blepharitis right eye, unspecified eyelid: Secondary | ICD-10-CM | POA: Diagnosis not present

## 2024-02-29 ENCOUNTER — Ambulatory Visit: Attending: Cardiology | Admitting: Cardiology

## 2024-02-29 ENCOUNTER — Encounter: Payer: Self-pay | Admitting: Cardiology

## 2024-02-29 VITALS — BP 130/60 | HR 60 | Ht 64.0 in | Wt 177.2 lb

## 2024-02-29 DIAGNOSIS — I4821 Permanent atrial fibrillation: Secondary | ICD-10-CM | POA: Diagnosis not present

## 2024-02-29 DIAGNOSIS — Z9889 Other specified postprocedural states: Secondary | ICD-10-CM | POA: Diagnosis not present

## 2024-02-29 DIAGNOSIS — I10 Essential (primary) hypertension: Secondary | ICD-10-CM

## 2024-02-29 DIAGNOSIS — E785 Hyperlipidemia, unspecified: Secondary | ICD-10-CM | POA: Diagnosis not present

## 2024-02-29 LAB — CBC

## 2024-02-29 NOTE — Patient Instructions (Signed)

## 2024-03-01 ENCOUNTER — Ambulatory Visit: Payer: Self-pay | Admitting: Cardiology

## 2024-03-01 LAB — COMPREHENSIVE METABOLIC PANEL WITH GFR
ALT: 22 IU/L (ref 0–32)
AST: 35 IU/L (ref 0–40)
Albumin: 4 g/dL (ref 3.9–4.9)
Alkaline Phosphatase: 107 IU/L (ref 44–121)
BUN/Creatinine Ratio: 13 (ref 12–28)
BUN: 15 mg/dL (ref 8–27)
Bilirubin Total: 0.8 mg/dL (ref 0.0–1.2)
CO2: 21 mmol/L (ref 20–29)
Calcium: 9.4 mg/dL (ref 8.7–10.3)
Chloride: 103 mmol/L (ref 96–106)
Creatinine, Ser: 1.14 mg/dL — ABNORMAL HIGH (ref 0.57–1.00)
Globulin, Total: 3.3 g/dL (ref 1.5–4.5)
Glucose: 95 mg/dL (ref 70–99)
Potassium: 4.6 mmol/L (ref 3.5–5.2)
Sodium: 139 mmol/L (ref 134–144)
Total Protein: 7.3 g/dL (ref 6.0–8.5)
eGFR: 52 mL/min/1.73 — ABNORMAL LOW (ref >59)

## 2024-03-01 LAB — CBC
Hematocrit: 35.3 (ref 34.0–46.6)
Hemoglobin: 11.4 g/dL (ref 11.1–15.9)
MCH: 29.5 pg (ref 26.6–33.0)
MCHC: 32.3 g/dL (ref 31.5–35.7)
MCV: 92 fL (ref 79–97)
Platelets: 152 x10E3/uL (ref 150–450)
RBC: 3.86 x10E6/uL (ref 3.77–5.28)
RDW: 14.5 (ref 11.7–15.4)
WBC: 6 x10E3/uL (ref 3.4–10.8)

## 2024-03-01 LAB — LIPID PANEL
Chol/HDL Ratio: 2.3 ratio (ref 0.0–4.4)
Cholesterol, Total: 144 mg/dL (ref 100–199)
HDL: 63 mg/dL (ref >39)
LDL Chol Calc (NIH): 68 mg/dL (ref 0–99)
Triglycerides: 66 mg/dL (ref 0–149)
VLDL Cholesterol Cal: 13 mg/dL (ref 5–40)

## 2024-03-02 NOTE — Telephone Encounter (Signed)
 Letter of results sent to pt

## 2024-03-16 ENCOUNTER — Ambulatory Visit: Attending: Cardiology

## 2024-03-16 DIAGNOSIS — Z5181 Encounter for therapeutic drug level monitoring: Secondary | ICD-10-CM | POA: Diagnosis not present

## 2024-03-16 DIAGNOSIS — I4891 Unspecified atrial fibrillation: Secondary | ICD-10-CM | POA: Diagnosis not present

## 2024-03-16 DIAGNOSIS — I059 Rheumatic mitral valve disease, unspecified: Secondary | ICD-10-CM

## 2024-03-16 DIAGNOSIS — I482 Chronic atrial fibrillation, unspecified: Secondary | ICD-10-CM

## 2024-03-16 DIAGNOSIS — Z9889 Other specified postprocedural states: Secondary | ICD-10-CM | POA: Diagnosis not present

## 2024-03-16 LAB — POCT INR: INR: 3 (ref 2.0–3.0)

## 2024-03-16 NOTE — Patient Instructions (Signed)
 Continue 1/2 a tablet daily except for 1 tablet on Mondays,  Wednesdays and Fridays. Recheck INR in 4 weeks  [phone]

## 2024-03-22 ENCOUNTER — Other Ambulatory Visit: Payer: Self-pay

## 2024-03-24 MED ORDER — AMLODIPINE BESYLATE 5 MG PO TABS: 5.0000 mg | ORAL_TABLET | Freq: Every day | ORAL | 3 refills | Status: AC

## 2024-03-30 DIAGNOSIS — D3132 Benign neoplasm of left choroid: Secondary | ICD-10-CM | POA: Diagnosis not present

## 2024-03-30 DIAGNOSIS — H01004 Unspecified blepharitis left upper eyelid: Secondary | ICD-10-CM | POA: Diagnosis not present

## 2024-03-30 DIAGNOSIS — Z961 Presence of intraocular lens: Secondary | ICD-10-CM | POA: Diagnosis not present

## 2024-03-30 DIAGNOSIS — H01001 Unspecified blepharitis right upper eyelid: Secondary | ICD-10-CM | POA: Diagnosis not present

## 2024-03-30 DIAGNOSIS — H04123 Dry eye syndrome of bilateral lacrimal glands: Secondary | ICD-10-CM | POA: Diagnosis not present

## 2024-03-31 ENCOUNTER — Other Ambulatory Visit: Payer: Self-pay | Admitting: Family Medicine

## 2024-03-31 ENCOUNTER — Other Ambulatory Visit: Payer: Self-pay | Admitting: Adult Health

## 2024-03-31 DIAGNOSIS — I482 Chronic atrial fibrillation, unspecified: Secondary | ICD-10-CM

## 2024-03-31 DIAGNOSIS — I10 Essential (primary) hypertension: Secondary | ICD-10-CM

## 2024-04-01 ENCOUNTER — Other Ambulatory Visit: Payer: Self-pay | Admitting: Family Medicine

## 2024-04-01 DIAGNOSIS — Z1231 Encounter for screening mammogram for malignant neoplasm of breast: Secondary | ICD-10-CM

## 2024-04-01 NOTE — Telephone Encounter (Signed)
 Requested Prescriptions  Pending Prescriptions Disp Refills   lisinopril  (ZESTRIL ) 40 MG tablet [Pharmacy Med Name: LISINOPRIL  40 MG Oral Tablet] 90 tablet 3    Sig: TAKE 1 TABLET EVERY DAY     Cardiovascular:  ACE Inhibitors Failed - 04/01/2024  3:35 PM      Failed - Cr in normal range and within 180 days    Creat  Date Value Ref Range Status  06/12/2023 0.78 0.50 - 1.05 mg/dL Final   Creatinine, Ser  Date Value Ref Range Status  02/29/2024 1.14 (H) 0.57 - 1.00 mg/dL Final   Creatinine, Urine  Date Value Ref Range Status  08/18/2007 145.6  Final         Passed - K in normal range and within 180 days    Potassium  Date Value Ref Range Status  02/29/2024 4.6 3.5 - 5.2 mmol/L Final         Passed - Patient is not pregnant      Passed - Last BP in normal range    BP Readings from Last 1 Encounters:  02/29/24 130/60         Passed - Valid encounter within last 6 months    Recent Outpatient Visits   None     Future Appointments             In 2 months Edman, Marsa PARAS, DO [place] [facility], [place]

## 2024-04-02 DIAGNOSIS — S93402A Sprain of unspecified ligament of left ankle, initial encounter: Secondary | ICD-10-CM | POA: Diagnosis not present

## 2024-04-02 DIAGNOSIS — S9032XA Contusion of left foot, initial encounter: Secondary | ICD-10-CM | POA: Diagnosis not present

## 2024-04-06 DIAGNOSIS — Z7901 Long term (current) use of anticoagulants: Secondary | ICD-10-CM | POA: Diagnosis not present

## 2024-04-06 DIAGNOSIS — S93492D Sprain of other ligament of left ankle, subsequent encounter: Secondary | ICD-10-CM | POA: Diagnosis not present

## 2024-04-06 DIAGNOSIS — S93412D Sprain of calcaneofibular ligament of left ankle, subsequent encounter: Secondary | ICD-10-CM | POA: Diagnosis not present

## 2024-04-06 DIAGNOSIS — S9032XD Contusion of left foot, subsequent encounter: Secondary | ICD-10-CM | POA: Diagnosis not present

## 2024-04-06 DIAGNOSIS — S93402A Sprain of unspecified ligament of left ankle, initial encounter: Secondary | ICD-10-CM | POA: Diagnosis not present

## 2024-04-07 ENCOUNTER — Other Ambulatory Visit: Payer: Self-pay | Admitting: Family Medicine

## 2024-04-07 DIAGNOSIS — G8929 Other chronic pain: Secondary | ICD-10-CM

## 2024-04-07 DIAGNOSIS — J3089 Other allergic rhinitis: Secondary | ICD-10-CM

## 2024-04-07 DIAGNOSIS — B001 Herpesviral vesicular dermatitis: Secondary | ICD-10-CM

## 2024-04-07 MED ORDER — METHOCARBAMOL 500 MG PO TABS: ORAL_TABLET | ORAL | 3 refills | Status: AC

## 2024-04-07 MED ORDER — CETIRIZINE HCL 10 MG PO TABS: 10.0000 mg | ORAL_TABLET | Freq: Every day | ORAL | 2 refills | Status: AC

## 2024-04-07 NOTE — Telephone Encounter (Signed)
 Copied from CRM [id number]. Topic: Clinical - Medication Refill >> Apr 07, 2024 12:28 PM Selinda RAMAN wrote: Medication: cetirizine  (ZYRTEC ) 10 MG tablet, methocarbamol  (ROBAXIN ) 500 MG tablet  Has the patient contacted their pharmacy? Yes   This is the patient's preferred pharmacy:  [facility] - [place], [place] - [address] [place] [address] Phone: [phone] Fax: [phone]   Is this the correct pharmacy for this prescription? Yes If no, delete pharmacy and type the correct one.   Has the prescription been filled recently? No  Is the patient out of the medication? Yes  Has the patient been seen for an appointment in the last year OR does the patient have an upcoming appointment? Yes  Can we respond through MyChart? Yes  Please assist patient further

## 2024-04-08 NOTE — Telephone Encounter (Signed)
 Too soon for refill.  Requested Prescriptions  Pending Prescriptions Disp Refills   acyclovir  (ZOVIRAX ) 400 MG tablet [Pharmacy Med Name: ACYCLOVIR  400 MG Oral Tablet] 90 tablet 11    Sig: TAKE 1 TABLET BY MOUTH 3 TIMES DAILY FOR 5 TO 10 DAYS OR UNTIL HEALED, FOR FEVER BLISTER.     Antimicrobials:  Antiviral Agents - Anti-Herpetic Passed - 04/08/2024  4:17 PM      Passed - Valid encounter within last 12 months    Recent Outpatient Visits   None     Future Appointments             In 2 months Karamalegos, Marsa PARAS, DO [place] [facility], [address]

## 2024-04-09 ENCOUNTER — Other Ambulatory Visit: Payer: Self-pay | Admitting: Family Medicine

## 2024-04-09 DIAGNOSIS — E7800 Pure hypercholesterolemia, unspecified: Secondary | ICD-10-CM

## 2024-04-09 DIAGNOSIS — Z5181 Encounter for therapeutic drug level monitoring: Secondary | ICD-10-CM

## 2024-04-11 NOTE — Telephone Encounter (Signed)
 Requested Prescriptions  Pending Prescriptions Disp Refills   furosemide  (LASIX ) 20 MG tablet [Pharmacy Med Name: FUROSEMIDE  20 MG Oral Tablet] 45 tablet 0    Sig: TAKE 1 TABLET EVERY OTHER DAY     Cardiovascular:  Diuretics - Loop Failed - 04/11/2024  4:25 PM      Failed - Cr in normal range and within 180 days    Creat  Date Value Ref Range Status  06/12/2023 0.78 0.50 - 1.05 mg/dL Final   Creatinine, Ser  Date Value Ref Range Status  02/29/2024 1.14 (H) 0.57 - 1.00 mg/dL Final   Creatinine, Urine  Date Value Ref Range Status  08/18/2007 145.6  Final         Failed - Mg Level in normal range and within 180 days    Magnesium  Date Value Ref Range Status  08/24/2007 2.1  Final         Passed - K in normal range and within 180 days    Potassium  Date Value Ref Range Status  02/29/2024 4.6 3.5 - 5.2 mmol/L Final         Passed - Ca in normal range and within 180 days    Calcium   Date Value Ref Range Status  02/29/2024 9.4 8.7 - 10.3 mg/dL Final   Calcium , Ion  Date Value Ref Range Status  10/06/2008 1.16 1.12 - 1.32 mmol/L Final         Passed - Na in normal range and within 180 days    Sodium  Date Value Ref Range Status  02/29/2024 139 134 - 144 mmol/L Final         Passed - Cl in normal range and within 180 days    Chloride  Date Value Ref Range Status  02/29/2024 103 96 - 106 mmol/L Final         Passed - Last BP in normal range    BP Readings from Last 1 Encounters:  02/29/24 130/60         Passed - Valid encounter within last 6 months    Recent Outpatient Visits   None     Future Appointments             In 2 months Karamalegos, Marsa PARAS, DO [place] [facility], [address]             atorvastatin  (LIPITOR) 40 MG tablet [Pharmacy Med Name: ATORVASTATIN  CALCIUM  40 MG Oral Tablet] 90 tablet 0    Sig: TAKE 1 TABLET EVERY DAY     Cardiovascular:  Antilipid - Statins Failed - 04/11/2024  4:25 PM      Failed - Lipid  Panel in normal range within the last 12 months    Cholesterol, Total  Date Value Ref Range Status  02/29/2024 144 100 - 199 mg/dL Final   LDL Cholesterol (Calc)  Date Value Ref Range Status  06/12/2023 49 mg/dL (calc) Final    Comment:    Reference range: <100 . Desirable range <100 mg/dL for primary prevention;   <70 mg/dL for patients with CHD or diabetic patients  with > or = 2 CHD risk factors. SABRA LDL-C is now calculated using the Martin-Hopkins  calculation, which is a validated novel method providing  better accuracy than the Friedewald equation in the  estimation of LDL-C.  Gladis APPLETHWAITE et al. SANDREA. 7986;689(80): 2061-2068  (http://education.[facility].com/faq/FAQ164)    LDL Chol Calc (NIH)  Date Value Ref Range Status  02/29/2024 68 0 -  99 mg/dL Final   Direct LDL  Date Value Ref Range Status  11/04/2010 192.5 mg/dL Final    Comment:    Optimal:  <100 mg/dLNear or Above Optimal:  100-129 mg/dLBorderline High:  130-159 mg/dLHigh:  160-189 mg/dLVery High:  >190 mg/dL   HDL  Date Value Ref Range Status  02/29/2024 63 >39 mg/dL Final   Triglycerides  Date Value Ref Range Status  02/29/2024 66 0 - 149 mg/dL Final         Passed - Patient is not pregnant      Passed - Valid encounter within last 12 months    Recent Outpatient Visits   None     Future Appointments             In 2 months Karamalegos, Marsa PARAS, DO [place] [facility], [address]

## 2024-04-13 ENCOUNTER — Ambulatory Visit: Attending: Cardiology

## 2024-04-13 DIAGNOSIS — I482 Chronic atrial fibrillation, unspecified: Secondary | ICD-10-CM

## 2024-04-13 DIAGNOSIS — I4891 Unspecified atrial fibrillation: Secondary | ICD-10-CM | POA: Diagnosis not present

## 2024-04-13 DIAGNOSIS — Z9889 Other specified postprocedural states: Secondary | ICD-10-CM

## 2024-04-13 DIAGNOSIS — Z5181 Encounter for therapeutic drug level monitoring: Secondary | ICD-10-CM | POA: Diagnosis not present

## 2024-04-13 DIAGNOSIS — I059 Rheumatic mitral valve disease, unspecified: Secondary | ICD-10-CM

## 2024-04-13 LAB — POCT INR: INR: 5.1 — AB (ref 2.0–3.0)

## 2024-04-13 NOTE — Patient Instructions (Signed)
 Hold today, Thursday and Friday then Continue 1/2 a tablet daily except for 1 tablet on Mondays, Wednesdays and Fridays. Recheck INR in 1 week  [phone]

## 2024-04-20 ENCOUNTER — Ambulatory Visit: Attending: Cardiology

## 2024-04-20 DIAGNOSIS — Z5181 Encounter for therapeutic drug level monitoring: Secondary | ICD-10-CM

## 2024-04-20 DIAGNOSIS — Z9889 Other specified postprocedural states: Secondary | ICD-10-CM

## 2024-04-20 DIAGNOSIS — I4891 Unspecified atrial fibrillation: Secondary | ICD-10-CM | POA: Diagnosis not present

## 2024-04-20 DIAGNOSIS — I349 Nonrheumatic mitral valve disorder, unspecified: Secondary | ICD-10-CM | POA: Diagnosis not present

## 2024-04-20 DIAGNOSIS — I059 Rheumatic mitral valve disease, unspecified: Secondary | ICD-10-CM

## 2024-04-20 DIAGNOSIS — I482 Chronic atrial fibrillation, unspecified: Secondary | ICD-10-CM | POA: Diagnosis not present

## 2024-04-20 LAB — POCT INR: INR: 1.6 — AB (ref 2.0–3.0)

## 2024-04-20 NOTE — Patient Instructions (Signed)
 Take 2 tablets today only then Continue 1/2 a tablet daily except for 1 tablet on Mondays, Wednesdays and Fridays. Recheck INR in 3 weeks  [phone]

## 2024-04-25 DIAGNOSIS — S9032XD Contusion of left foot, subsequent encounter: Secondary | ICD-10-CM | POA: Diagnosis not present

## 2024-04-27 ENCOUNTER — Other Ambulatory Visit: Payer: Self-pay | Admitting: Cardiology

## 2024-04-27 DIAGNOSIS — I482 Chronic atrial fibrillation, unspecified: Secondary | ICD-10-CM

## 2024-05-03 ENCOUNTER — Ambulatory Visit
Admission: RE | Admit: 2024-05-03 | Discharge: 2024-05-03 | Disposition: A | Discharge status: Home / Self Care | Source: Ambulatory Visit | Attending: Family Medicine | Admitting: Family Medicine

## 2024-05-03 DIAGNOSIS — Z1231 Encounter for screening mammogram for malignant neoplasm of breast: Secondary | ICD-10-CM | POA: Insufficient documentation

## 2024-05-11 ENCOUNTER — Ambulatory Visit: Attending: Cardiology

## 2024-05-11 DIAGNOSIS — I349 Nonrheumatic mitral valve disorder, unspecified: Secondary | ICD-10-CM | POA: Diagnosis not present

## 2024-05-11 DIAGNOSIS — Z5181 Encounter for therapeutic drug level monitoring: Secondary | ICD-10-CM | POA: Diagnosis not present

## 2024-05-11 DIAGNOSIS — I059 Rheumatic mitral valve disease, unspecified: Secondary | ICD-10-CM

## 2024-05-11 DIAGNOSIS — I4891 Unspecified atrial fibrillation: Secondary | ICD-10-CM | POA: Diagnosis not present

## 2024-05-11 DIAGNOSIS — Z9889 Other specified postprocedural states: Secondary | ICD-10-CM

## 2024-05-11 DIAGNOSIS — I482 Chronic atrial fibrillation, unspecified: Secondary | ICD-10-CM

## 2024-05-11 LAB — POCT INR: INR: 2.9 (ref 2.0–3.0)

## 2024-05-11 NOTE — Patient Instructions (Signed)
 Continue 1/2 a tablet daily except for 1 tablet on Mondays, Wednesdays and Fridays. Recheck INR in 3 weeks  [phone]

## 2024-06-01 ENCOUNTER — Ambulatory Visit: Attending: Cardiology

## 2024-06-01 DIAGNOSIS — I482 Chronic atrial fibrillation, unspecified: Secondary | ICD-10-CM

## 2024-06-01 DIAGNOSIS — I349 Nonrheumatic mitral valve disorder, unspecified: Secondary | ICD-10-CM

## 2024-06-01 DIAGNOSIS — Z5181 Encounter for therapeutic drug level monitoring: Secondary | ICD-10-CM

## 2024-06-01 DIAGNOSIS — I4891 Unspecified atrial fibrillation: Secondary | ICD-10-CM

## 2024-06-01 DIAGNOSIS — Z9889 Other specified postprocedural states: Secondary | ICD-10-CM | POA: Diagnosis not present

## 2024-06-01 DIAGNOSIS — I059 Rheumatic mitral valve disease, unspecified: Secondary | ICD-10-CM

## 2024-06-01 LAB — POCT INR: INR: 2.2 (ref 2.0–3.0)

## 2024-06-01 NOTE — Patient Instructions (Signed)
 Take 1.5 tablets today only then Continue 1/2 a tablet daily except for 1 tablet on Mondays, Wednesdays and Fridays. Recheck INR in 3 weeks  [phone]

## 2024-06-13 ENCOUNTER — Other Ambulatory Visit: Payer: Self-pay

## 2024-06-14 LAB — COMPLETE METABOLIC PANEL WITHOUT GFR
AG Ratio: 1.1 (calc) (ref 1.0–2.5)
ALT: 12 U/L (ref 6–29)
AST: 18 U/L (ref 10–35)
Albumin: 3.5 g/dL — ABNORMAL LOW (ref 3.6–5.1)
Alkaline phosphatase (APISO): 87 U/L (ref 37–153)
BUN: 10 mg/dL (ref 7–25)
CO2: 29 mmol/L (ref 20–32)
Calcium: 8.9 mg/dL (ref 8.6–10.4)
Chloride: 106 mmol/L (ref 98–110)
Creat: 0.87 mg/dL (ref 0.60–1.00)
Globulin: 3.3 g/dL (ref 1.9–3.7)
Glucose, Bld: 86 mg/dL (ref 65–99)
Potassium: 3.9 mmol/L (ref 3.5–5.3)
Sodium: 141 mmol/L (ref 135–146)
Total Bilirubin: 1 mg/dL (ref 0.2–1.2)
Total Protein: 6.8 g/dL (ref 6.1–8.1)

## 2024-06-14 LAB — CBC WITH DIFFERENTIAL/PLATELET
Absolute Lymphocytes: 811 {cells}/uL — ABNORMAL LOW (ref 850–3900)
Absolute Monocytes: 418 {cells}/uL (ref 200–950)
Basophils Absolute: 19 {cells}/uL (ref 0–200)
Basophils Relative: 0.4 %
Eosinophils Absolute: 38 {cells}/uL (ref 15–500)
Eosinophils Relative: 0.8 %
HCT: 34.9 % — ABNORMAL LOW (ref 35.9–46.0)
Hemoglobin: 11.3 g/dL — ABNORMAL LOW (ref 11.7–15.5)
MCH: 29.5 pg (ref 27.0–33.0)
MCHC: 32.4 g/dL (ref 31.6–35.4)
MCV: 91.1 fL (ref 81.4–101.7)
MPV: 12.2 fL (ref 7.5–12.5)
Monocytes Relative: 8.7 %
Neutro Abs: 3514 {cells}/uL (ref 1500–7800)
Neutrophils Relative %: 73.2 %
Platelets: 168 Thousand/uL (ref 140–400)
RBC: 3.83 Million/uL (ref 3.80–5.10)
RDW: 14.9 % (ref 11.0–15.0)
Total Lymphocyte: 16.9 %
WBC: 4.8 Thousand/uL (ref 3.8–10.8)

## 2024-06-14 LAB — TSH: TSH: 0.77 m[IU]/L (ref 0.40–4.50)

## 2024-06-14 LAB — LIPID PANEL
Cholesterol: 115 mg/dL
HDL: 48 mg/dL — ABNORMAL LOW
LDL Cholesterol (Calc): 52 mg/dL
Non-HDL Cholesterol (Calc): 67 mg/dL
Total CHOL/HDL Ratio: 2.4 (calc)
Triglycerides: 66 mg/dL

## 2024-06-14 LAB — HEMOGLOBIN A1C
Hgb A1c MFr Bld: 5.4 %
Mean Plasma Glucose: 108 mg/dL
eAG (mmol/L): 6 mmol/L

## 2024-06-20 ENCOUNTER — Other Ambulatory Visit: Payer: Self-pay | Admitting: Family Medicine

## 2024-06-20 ENCOUNTER — Ambulatory Visit: Payer: Self-pay | Admitting: Family Medicine

## 2024-06-20 ENCOUNTER — Encounter: Payer: Self-pay | Admitting: Family Medicine

## 2024-06-20 VITALS — BP 124/72 | HR 61 | Ht 64.0 in | Wt 178.0 lb

## 2024-06-20 DIAGNOSIS — R7309 Other abnormal glucose: Secondary | ICD-10-CM

## 2024-06-20 DIAGNOSIS — I10 Essential (primary) hypertension: Secondary | ICD-10-CM

## 2024-06-20 DIAGNOSIS — E042 Nontoxic multinodular goiter: Secondary | ICD-10-CM

## 2024-06-20 DIAGNOSIS — Z0000 Encounter for general adult medical examination without abnormal findings: Secondary | ICD-10-CM

## 2024-06-20 DIAGNOSIS — Z5181 Encounter for therapeutic drug level monitoring: Secondary | ICD-10-CM | POA: Diagnosis not present

## 2024-06-20 DIAGNOSIS — E7800 Pure hypercholesterolemia, unspecified: Secondary | ICD-10-CM | POA: Diagnosis not present

## 2024-06-20 DIAGNOSIS — I272 Pulmonary hypertension, unspecified: Secondary | ICD-10-CM

## 2024-06-20 DIAGNOSIS — I4891 Unspecified atrial fibrillation: Secondary | ICD-10-CM | POA: Diagnosis not present

## 2024-06-20 DIAGNOSIS — E559 Vitamin D deficiency, unspecified: Secondary | ICD-10-CM

## 2024-06-20 MED ORDER — ATORVASTATIN CALCIUM 40 MG PO TABS: 40.0000 mg | ORAL_TABLET | Freq: Every day | ORAL | 3 refills | Status: AC

## 2024-06-20 MED ORDER — LISINOPRIL 40 MG PO TABS: 40.0000 mg | ORAL_TABLET | Freq: Every day | ORAL | 3 refills | Status: AC

## 2024-06-20 MED ORDER — FUROSEMIDE 20 MG PO TABS: 20.0000 mg | ORAL_TABLET | ORAL | 3 refills | Status: AC

## 2024-06-20 NOTE — Patient Instructions (Addendum)
 Thank you for coming to the office today.  Recommend Shingles vaccine Shingrix at the pharmacy 2 doses, 2-6 months apart.  Medication refills sent to [facility].  Overall lab results look great!  Recent Labs    06/13/24 0755  HGBA1C 5.4   Lipid Panel     Component Value Date/Time   CHOL 115 06/13/2024 0755   CHOL 144 02/29/2024 0810   TRIG 66 06/13/2024 0755   HDL 48 (L) 06/13/2024 0755   HDL 63 02/29/2024 0810   CHOLHDL 2.4 06/13/2024 0755   VLDL 20 06/05/2015 0926   LDLCALC 52 06/13/2024 0755   LDLDIRECT 192.5 11/04/2010 1131   LABVLDL 13 02/29/2024 0810     DUE for FASTING BLOOD WORK (no food or drink after midnight before the lab appointment, only water or coffee without cream/sugar on the morning of)  SCHEDULE Lab Only visit in the morning at the clinic for lab draw in 1 YEAR  - Make sure Lab Only appointment is at about 1 week before your next appointment, so that results will be available  For Lab Results, once available within 2-3 days of blood draw, you can can log in to MyChart online to view your results and a brief explanation. Also, we can discuss results at next follow-up visit.   Please schedule a Follow-up Appointment to: Return for 1 year fasting lab > 1 week later Annual Physical.  If you have any other questions or concerns, please feel free to call the office or send a message through MyChart. You may also schedule an earlier appointment if necessary.  Additionally, you may be receiving a survey about your experience at our office within a few days to 1 week by e-mail or mail. We value your feedback.  Marsa Officer, DO [facility], [place]

## 2024-06-20 NOTE — Progress Notes (Signed)
 "  Subjective:    Patient ID: [name], female    DOB: September 20, 1955, 68 y.o.   MRN: [id number]  [name] is a 68 y.o. female presenting on 06/20/2024 for Annual Exam   HPI  Discussed the use of AI scribe software for clinical note transcription with the patient, who gave verbal consent to proceed.  History of Present Illness   [name] is a 68 year old female who presents for an annual physical exam.  Weight status - Weight stable over the past 3-4 months with slight increase attributed to holiday eating - Current weight approximately 177-178 pounds  Osteoporosis management - Mild osteoporosis - Previously treated with Fosamax  and Prolia . No further medications - No longer under endocrinology care for osteoporosis  Metabolic and laboratory findings - Blood sugar level 5.4 (December 22nd) - Hemoglobin A1c 5.4 - Hemoglobin stable at 11.3 - Kidney and liver function improved to normal levels  Hyperlipidemia management - Cholesterol managed with atorvastatin  40 mg  Fluid retention - Experiences fluid retention, particularly in the legs - Managed with furosemide  20 mg every other day  Gastrointestinal symptoms - Occasional stomach spasms causing significant discomfort - Uses dicyclomine  as needed for symptom relief     Follow-up Thyroid  Nodules Stable labs No diagnosis of hypothyroidism Never on medication   CHRONIC HTN: Reports no new concerns Current Meds - Lisinopril  40mg  daily, Metoprolol  XL 150mg  daily , Amlodipine  5mg  daily Reports good compliance, took meds today. Tolerating well, w/o complaints. Denies CP, dyspnea, HA, edema, dizziness / lightheadedness    Chronic Atrial Fibrillation, s/p Mitral Valve Replacement, chronic anticoagulation Pulmonary HTN / PAD - Followed by [facility] Cardiology Dr Pietro, continues to f/u with [facility]  [facility], she had ECHO on 11/12/17 with normal LVEF and mild mod pulm HTN, lasix  20mg  every other day was  added. Otherwise remains on anticoagulation with coumadin  for s/p MVR, rate control for permanent AFib. - She has followed Cardiology and Coumadin  Clinic   Health Maintenance:   UTD PNEUMONIA vaccine Prevnar20   Flu Shot updated RSV Shot completed   Considering Shingles vaccine   Colonoscopy 09/10/21 negative repeat 10 yr. Dr Jinny previously AGI.   Mammogram completed November 2025      06/20/2024    8:07 AM 02/04/2024    8:18 AM 06/12/2023    8:05 AM  Depression screen PHQ 2/9  Decreased Interest 0 0 0  Down, Depressed, Hopeless 0 0 0  PHQ - 2 Score 0 0 0       06/20/2024    8:07 AM 06/12/2023    8:05 AM 12/05/2020    8:00 AM 10/02/2015   10:25 AM  GAD 7 : Generalized Anxiety Score  Nervous, Anxious, on Edge 0 0 0 1  Control/stop worrying 0 0 0 0  Worry too much - different things 0 0 0 0  Trouble relaxing 0 0 0 2  Restless 0 0 0 2  Easily annoyed or irritable 0 0 0 0  Afraid - awful might happen 0 0 0 0  Total GAD 7 Score 0 0 0 5  Anxiety Difficulty   Not difficult at all Somewhat difficult     Past Medical History:  Diagnosis Date   Anticoagulated on warfarin    Arthritis    Heart murmur    History of cardiac catheterization    a. 2008 Cath: nl cors.   History of hyperthyroidism    a. 2003--  PTU [place]   Hypercholesterolemia  Hyperlipidemia    Multinodular goiter    Permanent atrial fibrillation (HCC)    a. Dx 1997; b. CHA2DS2VASc = 3-->warfarin (s/p MVR as well); c. 06/2013 Holter: HRs freq in low 100's to 1-teens w/ max rate 179-->beta blocker increased.   Pulmonary hypertension, mild (HCC)    Rheumatic heart disease    S/P MVR (mitral valve replacement)    a. 1997 s/p SJM mechanical MVR 2/2 Sev MS; b. chronic warfarin; c. 10/2017 Echo: EF 55-60%, no rwma, triv AI. Nl fxn of MV prosthesis. Sev dil LA. Mildly to mod increased PA pressures.   Past Surgical History:  Procedure Laterality Date   ANTERIOR CERVICAL DECOMP/DISCECTOMY FUSION   12-21-2001   C5 --  C6   BREAST BIOPSY Left 11/03/2016   Procedure: BREAST BIOPSY WITH NEEDLE LOCALIZATION;  Surgeon: Dessa Reyes ORN, MD;  Location: [facility] ORS;  Service: General;  Laterality: Left;   BREAST EXCISIONAL BIOPSY Left 2018   CARDIAC CATHETERIZATION  12-28-2006  [facility]   NORMAL CORONARY ARTERIES   CARPAL TUNNEL RELEASE Right 01-20-2007   COLONOSCOPY  2015   COLONOSCOPY WITH PROPOFOL  N/A 09/10/2021   Procedure: COLONOSCOPY WITH PROPOFOL ;  Surgeon: Jinny Carmine, MD;  Location: [facility] ENDOSCOPY;  Service: Endoscopy;  Laterality: N/A;   ERCP  08/15/2011   Procedure: ENDOSCOPIC RETROGRADE CHOLANGIOPANCREATOGRAPHY (ERCP);  Surgeon: Oliva FORBES Boots, MD;  Location: [facility] ENDOSCOPY;  Service: Endoscopy;  Laterality: N/A;   ERCP     MULTIPLE--  INCLUDING STENTS, SPHINTEROTOMY'S  STONE REMOVAL    ESOPHAGOGASTRODUODENOSCOPY (EGD) WITH PROPOFOL   09/10/2021   Procedure: ESOPHAGOGASTRODUODENOSCOPY (EGD) WITH PROPOFOL ;  Surgeon: Jinny Carmine, MD;  Location: [facility] ENDOSCOPY;  Service: Endoscopy;;   FEMUR IM NAIL Right 04/26/2014   Procedure: INTRAMEDULLARY (IM) NAIL FEMORAL;  Surgeon: Norleen LITTIE Gavel, MD;  Location: [facility] OR;  Service: Orthopedics;  Laterality: Right;   I & D EXTREMITY Right 02/07/2013   Procedure: IRRIGATION AND DEBRIDEMENT OF RIGHT LOWER LEG WITH PLACEMENT OF ACELL AND WOULND VAC;  Surgeon: Estefana Reichert, DO;  Location: [facility];  Service: Plastics;  Laterality: Right;   LAPAROSCOPIC CHOLECYSTECTOMY  04-08-2002   MITRAL VALVE REPLACEMENT  1997   [facility]   ORIF ANKLE FRACTURE Right 11/05/2012   Procedure: OPEN REDUCTION INTERNAL FIXATION (ORIF) ANKLE FRACTURE only operating on right;  Surgeon: Norleen LITTIE Gavel, MD;  Location: [facility] OR;  Service: Orthopedics;  Laterality: Right;   ORIF RIGHT ULNAR FX W/ ILIAC CREST BONE GRAFT  10-06-2008   ORIF WRIST FRACTURE Right 04/26/2014   Procedure: OPEN REDUCTION INTERNAL FIXATION (ORIF) WRIST FRACTURE;  Surgeon: Norleen LITTIE Gavel, MD;  Location: [facility] OR;  Service: Orthopedics;  Laterality: Right;   TRANSTHORACIC ECHOCARDIOGRAM  06-29-2009  dr wall   normal lvsf/ ef 55-60%/  normal bileaflet mechanical mvr/ moderated dilated la/ moderate tr   WRIST ARTHROSCOPY Right 12-10-2007   debridement and ulnar shortening osteotomy w/ plate   Social History   Socioeconomic History   Marital status: Divorced    Spouse name: Not on file   Number of children: 0   Years of education: 12   Highest education level: 12th grade  Occupational History   Occupation: geologist, engineering    Comment: retired  Tobacco Use   Smoking status: Never   Smokeless tobacco: Never  Vaping Use   Vaping status: Never Used  Substance and Sexual Activity   Alcohol use: No   Drug use: No   Sexual activity: Not on file  Other Topics  Concern   Not on file  Social History Narrative   Not on file   Social Drivers of Health   Tobacco Use: Low Risk (06/20/2024)   Patient History    Smoking Tobacco Use: Never    Smokeless Tobacco Use: Never    Passive Exposure: Not on file  Financial Resource Strain: Low Risk (02/04/2024)   Overall Financial Resource Strain (CARDIA)    Difficulty of Paying Living Expenses: Not hard at all  Food Insecurity: No Food Insecurity (02/04/2024)   Epic    Worried About Programme Researcher, Broadcasting/film/video in the Last Year: Never true    Ran Out of Food in the Last Year: Never true  Transportation Needs: No Transportation Needs (02/04/2024)   Epic    Lack of Transportation (Medical): No    Lack of Transportation (Non-Medical): No  Physical Activity: Inactive (02/04/2024)   Exercise Vital Sign    Days of Exercise per Week: 0 days    Minutes of Exercise per Session: 0 min  Stress: No Stress Concern Present (02/04/2024)   [employer] of Occupational Health - Occupational Stress Questionnaire    Feeling of Stress: Not at all  Social Connections: Moderately Integrated (02/04/2024)   Social Connection  and Isolation Panel    Frequency of Communication with Friends and Family: More than three times a week    Frequency of Social Gatherings with Friends and Family: More than three times a week    Attends Religious Services: More than 4 times per year    Active Member of Clubs or Organizations: Yes    Attends Banker Meetings: More than 4 times per year    Marital Status: Divorced  Intimate Partner Violence: Not At Risk (02/04/2024)   Epic    Fear of Current or Ex-Partner: No    Emotionally Abused: No    Physically Abused: No    Sexually Abused: No  Depression (PHQ2-9): Low Risk (06/20/2024)   Depression (PHQ2-9)    PHQ-2 Score: 0  Alcohol Screen: Low Risk (02/04/2024)   Alcohol Screen    Last Alcohol Screening Score (AUDIT): 0  Housing: Unknown (02/04/2024)   Epic    Unable to Pay for Housing in the Last Year: No    Number of Times Moved in the Last Year: Not on file    Homeless in the Last Year: No  Utilities: Not At Risk (02/04/2024)   Epic    Threatened with loss of utilities: No  Health Literacy: Adequate Health Literacy (02/04/2024)   B1300 Health Literacy    Frequency of need for help with medical instructions: Never   Family History  Problem Relation Age of Onset   Hypothyroidism Sister    Gallbladder disease Sister    Coronary artery disease Other    Diabetes Mother    Hypertension Mother    Heart disease Father    Stroke Father    Breast cancer Neg Hx    Current Outpatient Medications on File Prior to Visit  Medication Sig   acyclovir  (ZOVIRAX ) 400 MG tablet TAKE 1 TABLET BY MOUTH 3 TIMES DAILY FOR 5 TO 10 DAYS OR UNTIL HEALED FOR FEVER BLISTER.   amLODipine  (NORVASC ) 5 MG tablet Take 1 tablet (5 mg total) by mouth daily.   aspirin  EC 81 MG tablet Take 1 tablet (81 mg total) by mouth daily.   calcium  carbonate (OSCAL) 1500 (600 Ca) MG TABS tablet Take 1 tablet by mouth daily.    cetirizine  (ZYRTEC ) 10 MG tablet  Take 1  tablet (10 mg total) by mouth daily.   Cholecalciferol (VITAMIN D3) 2000 units capsule Take 1 capsule (2,000 Units total) by mouth daily.   dicyclomine  (BENTYL ) 10 MG capsule Take 1 capsule (10 mg total) by mouth 2 (two) times daily as needed for spasms (abdominal pain cramp).   fluticasone  (FLONASE ) 50 MCG/ACT nasal spray USE 2 SPRAYS IN EACH NOSTRIL EVERY DAY   methocarbamol  (ROBAXIN ) 500 MG tablet TAKE 1 TABLET FOUR TIMES DAILY AS NEEDED FOR MUSCLE SPASMS (PAIN)   metoprolol  succinate (TOPROL -XL) 100 MG 24 hr tablet TAKE 1 AND 1/2 TABLETS EVERY DAY WITH OR IMMEDIATELY FOLLOWING A MEAL.   Multiple Vitamins-Minerals (CENTRUM SILVER ADULT 50+ PO) Take 1 tablet by mouth daily.   warfarin (COUMADIN ) 5 MG tablet TAKE 1/2 TO 1 TABLET DAILY AS DIRECTED BY COUMADIN  CLINIC   ABRYSVO 120 MCG/0.5ML injection  (Patient not taking: Reported on 06/20/2024)   triamcinolone  cream (KENALOG ) 0.5 % Apply 1 application topically 2 (two) times daily. To affected areas, for up to 2 weeks. (Patient not taking: Reported on 06/20/2024)   No current facility-administered medications on file prior to visit.    Review of Systems  Constitutional:  Negative for activity change, appetite change, chills, diaphoresis, fatigue and fever.  HENT:  Negative for congestion and hearing loss.   Eyes:  Negative for visual disturbance.  Respiratory:  Negative for cough, chest tightness, shortness of breath and wheezing.   Cardiovascular:  Negative for chest pain, palpitations and leg swelling.  Gastrointestinal:  Negative for abdominal pain, constipation, diarrhea, nausea and vomiting.  Genitourinary:  Negative for dysuria, frequency and hematuria.  Musculoskeletal:  Negative for arthralgias and neck pain.  Skin:  Negative for rash.  Neurological:  Negative for dizziness, weakness, light-headedness, numbness and headaches.  Hematological:  Negative for adenopathy.  Psychiatric/Behavioral:  Negative for behavioral  problems, dysphoric mood and sleep disturbance.    Per HPI unless specifically indicated above     Objective:    BP 124/72 (BP Location: Left Arm, Patient Position: Sitting, Cuff Size: Normal)   Pulse 61   Ht 5' 4 (1.626 m)   Wt 178 lb (80.7 kg)   SpO2 95%   BMI 30.55 kg/m   Wt Readings from Last 3 Encounters:  06/20/24 178 lb (80.7 kg)  02/29/24 177 lb 3.2 oz (80.4 kg)  02/04/24 172 lb (78 kg)    Physical Exam Vitals and nursing note reviewed.  Constitutional:      General: She is not in acute distress.    Appearance: She is well-developed. She is not diaphoretic.     Comments: Well-appearing, comfortable, cooperative  HENT:     Head: Normocephalic and atraumatic.  Eyes:     General:        Right eye: No discharge.        Left eye: No discharge.     Conjunctiva/sclera: Conjunctivae normal.     Pupils: Pupils are equal, round, and reactive to light.  Neck:     Thyroid : No thyromegaly.  Cardiovascular:     Rate and Rhythm: Normal rate and regular rhythm.     Pulses: Normal pulses.     Heart sounds: Normal heart sounds. No murmur heard. Pulmonary:     Effort: Pulmonary effort is normal. No respiratory distress.     Breath sounds: Normal breath sounds. No wheezing or rales.  Abdominal:     General: Bowel sounds are normal. There is no distension.     Palpations: Abdomen  is soft. There is no mass.     Tenderness: There is no abdominal tenderness.  Musculoskeletal:        General: No tenderness. Normal range of motion.     Cervical back: Normal range of motion and neck supple.     Comments: Upper / Lower Extremities: - Normal muscle tone, strength bilateral upper extremities 5/5, lower extremities 5/5  Lymphadenopathy:     Cervical: No cervical adenopathy.  Skin:    General: Skin is warm and dry.     Findings: No erythema or rash.  Neurological:     Mental Status: She is alert and oriented to person, place, and time.     Comments: Distal sensation intact to light  touch all extremities  Psychiatric:        Mood and Affect: Mood normal.        Behavior: Behavior normal.        Thought Content: Thought content normal.     Comments: Well groomed, good eye contact, normal speech and thoughts     Results for orders placed or performed in visit on 06/13/24  TSH   Collection Time: 06/13/24  7:55 AM  Result Value Ref Range   TSH 0.77 0.40 - 4.50 mIU/L  COMPLETE METABOLIC PANEL WITH GFR   Collection Time: 06/13/24  7:55 AM  Result Value Ref Range   Glucose, Bld 86 65 - 99 mg/dL   BUN 10 7 - 25 mg/dL   Creat 9.12 9.39 - 8.99 mg/dL   BUN/Creatinine Ratio SEE NOTE: 6 - 22 (calc)   Sodium 141 135 - 146 mmol/L   Potassium 3.9 3.5 - 5.3 mmol/L   Chloride 106 98 - 110 mmol/L   CO2 29 20 - 32 mmol/L   Calcium  8.9 8.6 - 10.4 mg/dL   Total Protein 6.8 6.1 - 8.1 g/dL   Albumin  3.5 (L) 3.6 - 5.1 g/dL   Globulin 3.3 1.9 - 3.7 g/dL (calc)   AG Ratio 1.1 1.0 - 2.5 (calc)   Total Bilirubin 1.0 0.2 - 1.2 mg/dL   Alkaline phosphatase (APISO) 87 37 - 153 U/L   AST 18 10 - 35 U/L   ALT 12 6 - 29 U/L  CBC with Differential/Platelet   Collection Time: 06/13/24  7:55 AM  Result Value Ref Range   WBC 4.8 3.8 - 10.8 Thousand/uL   RBC 3.83 3.80 - 5.10 Million/uL   Hemoglobin 11.3 (L) 11.7 - 15.5 g/dL   HCT 65.0 (L) 64.0 - 53.9 %   MCV 91.1 81.4 - 101.7 fL   MCH 29.5 27.0 - 33.0 pg   MCHC 32.4 31.6 - 35.4 g/dL   RDW 85.0 88.9 - 84.9 %   Platelets 168 140 - 400 Thousand/uL   MPV 12.2 7.5 - 12.5 fL   Neutro Abs 3,514 1,500 - 7,800 cells/uL   Absolute Lymphocytes 811 (L) 850 - 3,900 cells/uL   Absolute Monocytes 418 200 - 950 cells/uL   Eosinophils Absolute 38 15 - 500 cells/uL   Basophils Absolute 19 0 - 200 cells/uL   Neutrophils Relative % 73.2 %   Total Lymphocyte 16.9 %   Monocytes Relative 8.7 %   Eosinophils Relative 0.8 %   Basophils Relative 0.4 %  Lipid panel   Collection Time: 06/13/24  7:55 AM  Result Value Ref Range   Cholesterol 115 <200  mg/dL   HDL 48 (L) > OR = 50 mg/dL   Triglycerides 66 <849 mg/dL   LDL Cholesterol (Calc)  52 mg/dL (calc)   Total CHOL/HDL Ratio 2.4 <5.0 (calc)   Non-HDL Cholesterol (Calc) 67 <869 mg/dL (calc)  Hemoglobin J8r   Collection Time: 06/13/24  7:55 AM  Result Value Ref Range   Hgb A1c MFr Bld 5.4 <5.7 %   Mean Plasma Glucose 108 mg/dL   eAG (mmol/L) 6.0 mmol/L      Assessment & Plan:   Problem List Items Addressed This Visit     Elevated hemoglobin A1c   Encounter for therapeutic drug monitoring   Relevant Medications   furosemide  (LASIX ) 20 MG tablet   Essential hypertension   Relevant Medications   atorvastatin  (LIPITOR) 40 MG tablet   furosemide  (LASIX ) 20 MG tablet   lisinopril  (ZESTRIL ) 40 MG tablet   HYPERCHOLESTEROLEMIA   Relevant Medications   atorvastatin  (LIPITOR) 40 MG tablet   furosemide  (LASIX ) 20 MG tablet   lisinopril  (ZESTRIL ) 40 MG tablet   Multinodular goiter   Pulmonary hypertension (HCC)   Relevant Medications   atorvastatin  (LIPITOR) 40 MG tablet   furosemide  (LASIX ) 20 MG tablet   lisinopril  (ZESTRIL ) 40 MG tablet   Other Visit Diagnoses       Annual physical exam    -  Primary     Atrial fibrillation, unspecified type (HCC)       Relevant Medications   atorvastatin  (LIPITOR) 40 MG tablet   furosemide  (LASIX ) 20 MG tablet   lisinopril  (ZESTRIL ) 40 MG tablet        Updated Health Maintenance information Reviewed recent lab results with patient Encouraged improvement to lifestyle with diet and exercise Goal of weight loss   Adult Wellness Visit Annual wellness visit conducted Scheduled next annual wellness visit in December. - Continue current medications and lifestyle.  Essential hypertension Blood pressure well-controlled at 124/72 mmHg with current medication regimen. - Continue current antihypertensive medications. - Amlodipine  5mg  daily, Metoprolol  XL 100mg , Lisinopril  40mg  daily  Pure hypercholesterolemia Cholesterol levels  well-controlled with atorvastatin  40 mg. Lipid panel excellent. - Continue atorvastatin  40 mg daily.  Multinodular goiter Stable without new concerns Monitor labs  Osteoporosis Mild osteoporosis noted on previous bone density scan. No recent follow-up with endocrinology. - No current plan for bone density scan update. No longer on medication  Pulmonary Hypertension Chronc Atrial Fibrillation s/p Mitral valve replacement and on anticoagulation Chronic lower extremity edema Followed by Cardiology Persistent fluid retention in lower extremities. Albumin  level borderline low. Current fluid management regimen stable. - Continue current medication management regimen.  For edema - Consider elevation and compression therapy for edema management. - Monitor edema and consider further evaluation if it worsens.        No orders of the defined types were placed in this encounter.   Meds ordered this encounter  Medications   atorvastatin  (LIPITOR) 40 MG tablet    Sig: Take 1 tablet (40 mg total) by mouth daily.    Dispense:  90 tablet    Refill:  3    Add refills   furosemide  (LASIX ) 20 MG tablet    Sig: Take 1 tablet (20 mg total) by mouth every other day.    Dispense:  45 tablet    Refill:  3    Add refills   lisinopril  (ZESTRIL ) 40 MG tablet    Sig: Take 1 tablet (40 mg total) by mouth daily.    Dispense:  90 tablet    Refill:  3    Add future refills     Follow up plan: Return for 1  year fasting lab > 1 week later Annual Physical.  Future labs 06/12/25  Marsa Officer, DO [facility] 06/20/2024, 8:17 AM  "

## 2024-06-22 ENCOUNTER — Ambulatory Visit

## 2024-06-29 ENCOUNTER — Ambulatory Visit: Attending: Cardiology

## 2024-06-29 DIAGNOSIS — I349 Nonrheumatic mitral valve disorder, unspecified: Secondary | ICD-10-CM

## 2024-06-29 DIAGNOSIS — I4891 Unspecified atrial fibrillation: Secondary | ICD-10-CM

## 2024-06-29 DIAGNOSIS — I482 Chronic atrial fibrillation, unspecified: Secondary | ICD-10-CM

## 2024-06-29 DIAGNOSIS — I059 Rheumatic mitral valve disease, unspecified: Secondary | ICD-10-CM

## 2024-06-29 DIAGNOSIS — Z5181 Encounter for therapeutic drug level monitoring: Secondary | ICD-10-CM

## 2024-06-29 DIAGNOSIS — Z9889 Other specified postprocedural states: Secondary | ICD-10-CM

## 2024-06-29 LAB — POCT INR: INR: 2.2 (ref 2.0–3.0)

## 2024-06-29 NOTE — Patient Instructions (Signed)
 Take 2 tablets today only then Continue 1/2 a tablet daily except for 1 tablet on Mondays, Wednesdays and Fridays. Recheck INR in 4 weeks  [id number]

## 2024-07-27 ENCOUNTER — Ambulatory Visit

## 2024-07-27 DIAGNOSIS — Z9889 Other specified postprocedural states: Secondary | ICD-10-CM

## 2024-07-27 DIAGNOSIS — Z5181 Encounter for therapeutic drug level monitoring: Secondary | ICD-10-CM

## 2024-07-27 DIAGNOSIS — I349 Nonrheumatic mitral valve disorder, unspecified: Secondary | ICD-10-CM

## 2024-07-27 DIAGNOSIS — I482 Chronic atrial fibrillation, unspecified: Secondary | ICD-10-CM

## 2024-07-27 DIAGNOSIS — I059 Rheumatic mitral valve disease, unspecified: Secondary | ICD-10-CM

## 2024-07-27 DIAGNOSIS — I4891 Unspecified atrial fibrillation: Secondary | ICD-10-CM

## 2024-07-27 LAB — POCT INR: INR: 2.6 (ref 2.0–3.0)

## 2024-07-27 NOTE — Patient Instructions (Signed)
 Continue 1/2 a tablet daily except for 1 tablet on Mondays, Wednesdays and Fridays. Recheck INR in 3 weeks  [phone]

## 2024-08-17 ENCOUNTER — Ambulatory Visit

## 2025-02-17 ENCOUNTER — Ambulatory Visit

## 2025-02-22 ENCOUNTER — Ambulatory Visit

## 2025-06-12 ENCOUNTER — Other Ambulatory Visit

## 2025-06-19 ENCOUNTER — Encounter: Admitting: Family Medicine
